# Patient Record
Sex: Female | Born: 1975 | Hispanic: No | Marital: Married | Smoking: Never smoker
Health system: Southern US, Community
[De-identification: ages and names within clinical notes are randomized; demographics above are authoritative.]

## PROBLEM LIST (undated history)

## (undated) ENCOUNTER — Emergency Department: Admission: EM | Payer: Medicare (Managed Care)

## (undated) ENCOUNTER — Emergency Department: Disposition: A | Discharge status: Home / Self Care | Payer: Medicare Other | Source: Home / Self Care

## (undated) ENCOUNTER — Emergency Department
Discharge status: Against Medical Advice | Payer: Medicare Other | Attending: Emergency Medicine | Admitting: Emergency Medicine

## (undated) DIAGNOSIS — D649 Anemia, unspecified: Secondary | ICD-10-CM

## (undated) DIAGNOSIS — E119 Type 2 diabetes mellitus without complications: Secondary | ICD-10-CM

## (undated) DIAGNOSIS — J302 Other seasonal allergic rhinitis: Secondary | ICD-10-CM

## (undated) DIAGNOSIS — F32A Depression, unspecified: Secondary | ICD-10-CM

## (undated) DIAGNOSIS — F410 Panic disorder [episodic paroxysmal anxiety] without agoraphobia: Secondary | ICD-10-CM

## (undated) DIAGNOSIS — J45909 Unspecified asthma, uncomplicated: Secondary | ICD-10-CM

## (undated) DIAGNOSIS — T7840XA Allergy, unspecified, initial encounter: Secondary | ICD-10-CM

## (undated) DIAGNOSIS — I10 Essential (primary) hypertension: Secondary | ICD-10-CM

## (undated) HISTORY — PX: CHOLECYSTECTOMY: SHX55

## (undated) HISTORY — PX: TONSILLECTOMY: SUR1361

## (undated) HISTORY — PX: HERNIA REPAIR: SHX51

---

## 1997-03-11 ENCOUNTER — Ambulatory Visit
Admit: 1997-03-11 | Disposition: A | Discharge status: Home / Self Care | Payer: Self-pay | Source: Ambulatory Visit | Admitting: Obstetrics and Gynecology

## 1997-05-03 ENCOUNTER — Ambulatory Visit
Admission: RE | Admit: 1997-05-03 | Disposition: A | Discharge status: Home / Self Care | Payer: Self-pay | Source: Ambulatory Visit | Admitting: Obstetrics and Gynecology

## 1997-06-09 ENCOUNTER — Inpatient Hospital Stay
Admission: RE | Admit: 1997-06-09 | Disposition: A | Discharge status: Home / Self Care | Payer: Self-pay | Source: Ambulatory Visit | Admitting: Obstetrics and Gynecology

## 1998-10-19 ENCOUNTER — Emergency Department: Admit: 1998-10-19 | Payer: Self-pay | Admitting: Emergency Medicine

## 1998-11-27 ENCOUNTER — Emergency Department: Admit: 1998-11-27 | Payer: Self-pay | Source: Emergency Department | Admitting: Emergency Medicine

## 1998-12-17 ENCOUNTER — Emergency Department: Admit: 1998-12-17 | Payer: Self-pay | Source: Emergency Department | Admitting: Pediatric Emergency Medicine

## 1999-02-01 ENCOUNTER — Emergency Department: Admit: 1999-02-01 | Payer: Self-pay | Source: Emergency Department | Admitting: Emergency Medical Services

## 1999-06-17 ENCOUNTER — Inpatient Hospital Stay
Admission: EM | Admit: 1999-06-17 | Disposition: A | Discharge status: Home / Self Care | Payer: Self-pay | Source: Emergency Department | Admitting: Obstetrics and Gynecology

## 1999-06-30 ENCOUNTER — Ambulatory Visit
Admission: RE | Admit: 1999-06-30 | Disposition: A | Discharge status: Home / Self Care | Payer: Self-pay | Source: Ambulatory Visit | Admitting: Obstetrics and Gynecology

## 1999-07-07 ENCOUNTER — Ambulatory Visit
Admission: RE | Admit: 1999-07-07 | Disposition: A | Discharge status: Home / Self Care | Payer: Self-pay | Source: Ambulatory Visit | Admitting: Obstetrics & Gynecology

## 1999-07-25 ENCOUNTER — Ambulatory Visit
Admission: RE | Admit: 1999-07-25 | Disposition: A | Discharge status: Home / Self Care | Payer: Self-pay | Source: Ambulatory Visit | Admitting: Obstetrics & Gynecology

## 1999-08-19 ENCOUNTER — Observation Stay (HOSPITAL_BASED_OUTPATIENT_CLINIC_OR_DEPARTMENT_OTHER)
Admission: AD | Admit: 1999-08-19 | Disposition: A | Discharge status: Home / Self Care | Payer: Self-pay | Source: Ambulatory Visit | Admitting: Obstetrics & Gynecology

## 1999-08-28 ENCOUNTER — Observation Stay
Admission: RE | Admit: 1999-08-28 | Disposition: A | Discharge status: Home / Self Care | Payer: Self-pay | Source: Ambulatory Visit | Admitting: Obstetrics and Gynecology

## 1999-08-29 ENCOUNTER — Inpatient Hospital Stay
Admission: RE | Admit: 1999-08-29 | Disposition: A | Discharge status: Home / Self Care | Payer: Self-pay | Source: Ambulatory Visit | Admitting: Obstetrics and Gynecology

## 2000-05-12 ENCOUNTER — Emergency Department: Admit: 2000-05-12 | Payer: Self-pay | Source: Emergency Department | Admitting: Emergency Medicine

## 2000-06-03 ENCOUNTER — Emergency Department: Admit: 2000-06-03 | Payer: Self-pay | Source: Emergency Department

## 2000-06-22 ENCOUNTER — Emergency Department: Admit: 2000-06-22 | Payer: Self-pay | Source: Emergency Department | Admitting: Emergency Medicine

## 2000-07-07 ENCOUNTER — Emergency Department: Admit: 2000-07-07 | Payer: Self-pay | Source: Emergency Department

## 2000-07-29 ENCOUNTER — Emergency Department: Admit: 2000-07-29 | Payer: Self-pay | Source: Emergency Department | Admitting: Pediatric Emergency Medicine

## 2000-07-31 ENCOUNTER — Observation Stay (HOSPITAL_BASED_OUTPATIENT_CLINIC_OR_DEPARTMENT_OTHER)
Admission: RE | Admit: 2000-07-31 | Disposition: A | Discharge status: Home / Self Care | Payer: Self-pay | Source: Ambulatory Visit

## 2000-08-01 ENCOUNTER — Observation Stay (HOSPITAL_BASED_OUTPATIENT_CLINIC_OR_DEPARTMENT_OTHER)
Admission: RE | Admit: 2000-08-01 | Disposition: A | Discharge status: Home / Self Care | Payer: Self-pay | Source: Ambulatory Visit

## 2000-08-15 ENCOUNTER — Inpatient Hospital Stay (INDEPENDENT_AMBULATORY_CARE_PROVIDER_SITE_OTHER): Admit: 2000-08-15 | Disposition: A | Discharge status: Home / Self Care | Payer: Self-pay | Source: Ambulatory Visit

## 2000-08-29 ENCOUNTER — Emergency Department: Admit: 2000-08-29 | Payer: Self-pay | Source: Emergency Department

## 2000-09-13 ENCOUNTER — Observation Stay (HOSPITAL_BASED_OUTPATIENT_CLINIC_OR_DEPARTMENT_OTHER)
Admission: RE | Admit: 2000-09-13 | Disposition: A | Discharge status: Home / Self Care | Payer: Self-pay | Source: Ambulatory Visit

## 2000-09-28 ENCOUNTER — Observation Stay (HOSPITAL_BASED_OUTPATIENT_CLINIC_OR_DEPARTMENT_OTHER)
Admission: RE | Admit: 2000-09-28 | Disposition: A | Discharge status: Home / Self Care | Payer: Self-pay | Source: Ambulatory Visit

## 2000-09-28 ENCOUNTER — Ambulatory Visit: Admit: 2000-09-28 | Disposition: A | Discharge status: Home / Self Care | Payer: Self-pay | Source: Ambulatory Visit

## 2000-10-16 ENCOUNTER — Emergency Department: Admit: 2000-10-16 | Payer: Self-pay | Source: Emergency Department

## 2000-10-25 ENCOUNTER — Observation Stay (HOSPITAL_BASED_OUTPATIENT_CLINIC_OR_DEPARTMENT_OTHER)
Admission: RE | Admit: 2000-10-25 | Disposition: A | Discharge status: Home / Self Care | Payer: Self-pay | Source: Ambulatory Visit

## 2000-10-30 ENCOUNTER — Observation Stay (HOSPITAL_BASED_OUTPATIENT_CLINIC_OR_DEPARTMENT_OTHER)
Admission: RE | Admit: 2000-10-30 | Disposition: A | Discharge status: Home / Self Care | Payer: Self-pay | Source: Ambulatory Visit

## 2000-11-08 ENCOUNTER — Observation Stay (HOSPITAL_BASED_OUTPATIENT_CLINIC_OR_DEPARTMENT_OTHER)
Admission: RE | Admit: 2000-11-08 | Disposition: A | Discharge status: Home / Self Care | Payer: Self-pay | Source: Ambulatory Visit

## 2000-11-14 ENCOUNTER — Observation Stay (HOSPITAL_BASED_OUTPATIENT_CLINIC_OR_DEPARTMENT_OTHER)
Admission: RE | Admit: 2000-11-14 | Disposition: A | Discharge status: Home / Self Care | Payer: Self-pay | Source: Ambulatory Visit

## 2000-11-20 ENCOUNTER — Emergency Department: Admit: 2000-11-20 | Payer: Self-pay | Source: Emergency Department | Admitting: Emergency Medicine

## 2000-11-20 ENCOUNTER — Inpatient Hospital Stay (HOSPITAL_BASED_OUTPATIENT_CLINIC_OR_DEPARTMENT_OTHER)
Admission: RE | Admit: 2000-11-20 | Disposition: A | Discharge status: Home / Self Care | Payer: Self-pay | Source: Ambulatory Visit

## 2000-12-20 ENCOUNTER — Emergency Department: Admit: 2000-12-20 | Payer: Self-pay | Source: Emergency Department

## 2000-12-21 ENCOUNTER — Emergency Department: Admit: 2000-12-21 | Payer: Self-pay | Source: Emergency Department

## 2001-03-20 ENCOUNTER — Emergency Department: Admit: 2001-03-20 | Payer: Self-pay | Source: Emergency Department | Admitting: Emergency Medicine

## 2001-03-26 ENCOUNTER — Emergency Department: Admit: 2001-03-26 | Payer: Self-pay | Source: Emergency Department | Admitting: Emergency Medicine

## 2001-03-26 ENCOUNTER — Observation Stay
Admission: EM | Admit: 2001-03-26 | Disposition: A | Discharge status: Home / Self Care | Payer: Self-pay | Source: Ambulatory Visit | Admitting: Surgery

## 2001-04-02 ENCOUNTER — Inpatient Hospital Stay
Admission: EM | Admit: 2001-04-02 | Disposition: A | Discharge status: Home / Self Care | Payer: Self-pay | Source: Emergency Department | Admitting: Surgery

## 2001-04-09 ENCOUNTER — Emergency Department: Admit: 2001-04-09 | Payer: Self-pay | Source: Emergency Department | Admitting: Emergency Medicine

## 2001-08-06 ENCOUNTER — Emergency Department: Admit: 2001-08-06 | Payer: Self-pay | Source: Emergency Department | Admitting: Emergency Medicine

## 2001-09-04 ENCOUNTER — Emergency Department: Admit: 2001-09-04 | Payer: Self-pay | Source: Emergency Department | Admitting: Emergency Medicine

## 2002-09-11 ENCOUNTER — Emergency Department: Admit: 2002-09-11 | Payer: Self-pay | Source: Emergency Department | Admitting: Emergency Medicine

## 2002-11-15 ENCOUNTER — Emergency Department: Admit: 2002-11-15 | Payer: Self-pay | Source: Emergency Department | Admitting: Emergency Medicine

## 2002-12-31 ENCOUNTER — Emergency Department: Admit: 2002-12-31 | Payer: Self-pay | Source: Emergency Department | Admitting: Emergency Medicine

## 2003-06-30 ENCOUNTER — Emergency Department: Admit: 2003-06-30 | Payer: Self-pay | Source: Emergency Department

## 2003-06-30 ENCOUNTER — Emergency Department: Admit: 2003-06-30 | Payer: Self-pay | Source: Emergency Department | Admitting: Emergency Medicine

## 2003-08-07 ENCOUNTER — Emergency Department: Admit: 2003-08-07 | Payer: Self-pay | Source: Emergency Department | Admitting: Emergency Medicine

## 2003-08-27 ENCOUNTER — Emergency Department: Admit: 2003-08-27 | Payer: Self-pay | Source: Emergency Department | Admitting: Emergency Medicine

## 2003-08-28 ENCOUNTER — Emergency Department: Admit: 2003-08-28 | Payer: Self-pay | Source: Emergency Department | Admitting: Emergency Medicine

## 2003-08-28 ENCOUNTER — Emergency Department: Admit: 2003-08-28 | Payer: Self-pay | Source: Emergency Department

## 2003-10-14 ENCOUNTER — Emergency Department: Admit: 2003-10-14 | Payer: Self-pay | Source: Emergency Department | Admitting: Emergency Medicine

## 2004-01-03 ENCOUNTER — Emergency Department: Admit: 2004-01-03 | Payer: Self-pay | Attending: Emergency Medicine | Admitting: Emergency Medicine

## 2004-01-07 ENCOUNTER — Emergency Department: Admit: 2004-01-07 | Payer: Self-pay | Source: Emergency Department

## 2004-02-13 ENCOUNTER — Emergency Department: Admit: 2004-02-13 | Payer: Self-pay | Source: Emergency Department

## 2004-04-22 ENCOUNTER — Emergency Department: Admit: 2004-04-22 | Payer: Self-pay

## 2004-04-23 ENCOUNTER — Emergency Department: Admit: 2004-04-23 | Payer: Self-pay | Source: Emergency Department | Admitting: Emergency Medicine

## 2004-06-21 ENCOUNTER — Inpatient Hospital Stay
Admission: EM | Admit: 2004-06-21 | Disposition: A | Discharge status: Home / Self Care | Payer: Self-pay | Source: Emergency Department | Admitting: Internal Medicine

## 2004-07-10 ENCOUNTER — Emergency Department: Admit: 2004-07-10 | Payer: Self-pay | Source: Emergency Department

## 2004-07-22 ENCOUNTER — Emergency Department: Admit: 2004-07-22 | Payer: Self-pay | Source: Emergency Department

## 2004-10-06 ENCOUNTER — Emergency Department: Admit: 2004-10-06 | Payer: Self-pay | Source: Emergency Department | Admitting: Emergency Medicine

## 2004-12-04 ENCOUNTER — Emergency Department: Admit: 2004-12-04 | Payer: Self-pay | Source: Emergency Department | Admitting: Emergency Medicine

## 2004-12-10 ENCOUNTER — Emergency Department: Admit: 2004-12-10 | Payer: Self-pay | Source: Ambulatory Visit | Admitting: Emergency Medicine

## 2005-01-10 ENCOUNTER — Emergency Department: Admit: 2005-01-10 | Payer: Self-pay | Source: Emergency Department

## 2005-01-14 ENCOUNTER — Emergency Department: Admit: 2005-01-14 | Payer: Self-pay | Source: Emergency Department | Admitting: Emergency Medicine

## 2005-01-29 ENCOUNTER — Emergency Department: Admit: 2005-01-29 | Payer: Self-pay | Source: Emergency Department

## 2005-02-16 ENCOUNTER — Emergency Department: Admit: 2005-02-16 | Payer: Self-pay | Source: Emergency Department

## 2005-02-19 ENCOUNTER — Emergency Department: Admit: 2005-02-19 | Payer: Self-pay

## 2005-03-03 ENCOUNTER — Emergency Department: Admit: 2005-03-03 | Payer: Self-pay | Source: Emergency Department

## 2005-03-13 ENCOUNTER — Emergency Department: Admit: 2005-03-13 | Payer: Self-pay | Source: Emergency Department

## 2005-03-13 ENCOUNTER — Emergency Department: Admit: 2005-03-13 | Payer: Self-pay | Admitting: Emergency Medicine

## 2005-03-30 ENCOUNTER — Emergency Department: Admit: 2005-03-30 | Payer: Self-pay | Source: Emergency Department

## 2005-04-20 ENCOUNTER — Emergency Department: Admit: 2005-04-20 | Payer: Self-pay

## 2005-05-20 ENCOUNTER — Emergency Department: Admit: 2005-05-20 | Payer: Self-pay | Source: Emergency Department

## 2005-05-21 ENCOUNTER — Emergency Department: Admit: 2005-05-21 | Payer: Self-pay

## 2005-06-03 ENCOUNTER — Emergency Department: Admit: 2005-06-03 | Payer: Self-pay | Source: Emergency Department | Admitting: Emergency Medicine

## 2005-06-04 ENCOUNTER — Emergency Department: Admit: 2005-06-04 | Payer: Self-pay | Source: Emergency Department | Admitting: Emergency Medicine

## 2005-06-25 ENCOUNTER — Emergency Department: Admit: 2005-06-25 | Payer: Self-pay | Source: Emergency Department

## 2005-07-10 ENCOUNTER — Emergency Department: Admit: 2005-07-10 | Payer: Self-pay | Source: Emergency Department

## 2005-07-11 ENCOUNTER — Emergency Department: Admit: 2005-07-11 | Payer: Self-pay | Source: Emergency Department

## 2005-07-28 ENCOUNTER — Emergency Department: Admit: 2005-07-28 | Payer: Self-pay | Source: Emergency Department

## 2005-07-28 LAB — URINALYSIS
Bilirubin, UA: NEGATIVE
Glucose, UA: NEGATIVE
Ketones UA: NEGATIVE
Leukocyte Esterase, UA: NEGATIVE
Nitrite, UA: NEGATIVE
Protein, UR: NEGATIVE
Specific Gravity UA POCT: 1.015 (ref <=1.030)
Urine pH: 6 (ref 5.0–8.0)
Urobilinogen, UA: 0.2

## 2005-07-28 LAB — URINE MICROSCOPIC

## 2005-07-28 LAB — URINE HCG QUALITATIVE: Urine HCG Qualitative: NEGATIVE

## 2005-08-01 ENCOUNTER — Emergency Department: Admit: 2005-08-01 | Payer: Self-pay | Source: Emergency Department

## 2005-08-24 ENCOUNTER — Emergency Department: Admit: 2005-08-24 | Payer: Self-pay | Source: Emergency Department

## 2005-09-25 ENCOUNTER — Emergency Department: Admit: 2005-09-25 | Payer: Self-pay | Source: Emergency Department

## 2005-09-25 LAB — URINALYSIS
Bilirubin, UA: NEGATIVE
Blood, UA: NEGATIVE
Glucose, UA: NEGATIVE
Ketones UA: NEGATIVE
Leukocyte Esterase, UA: NEGATIVE
Nitrite, UA: NEGATIVE
Protein, UR: NEGATIVE
Specific Gravity UA POCT: 1.015 (ref <=1.030)
Urine pH: 5.5 (ref 5.0–8.0)
Urobilinogen, UA: 0.2

## 2005-09-25 LAB — URINE HCG QUALITATIVE: Urine HCG Qualitative: NEGATIVE

## 2005-10-12 ENCOUNTER — Ambulatory Visit
Admit: 2005-10-12 | Disposition: A | Discharge status: Home / Self Care | Payer: Self-pay | Source: Emergency Department | Admitting: Internal Medicine

## 2005-11-07 ENCOUNTER — Emergency Department: Admit: 2005-11-07 | Payer: Self-pay | Source: Emergency Department

## 2005-11-08 LAB — URINE MICROSCOPIC

## 2005-11-08 LAB — URINALYSIS
Bilirubin, UA: NEGATIVE
Blood, UA: NEGATIVE
Glucose, UA: NEGATIVE
Ketones UA: NEGATIVE
Nitrite, UA: NEGATIVE
Protein, UR: NEGATIVE
Specific Gravity UA POCT: 1.02 (ref <=1.030)
Urine pH: 6.5 (ref 5.0–8.0)
Urobilinogen, UA: 0.2

## 2005-11-08 LAB — URINE HCG QUALITATIVE: Urine HCG Qualitative: NEGATIVE

## 2006-02-07 ENCOUNTER — Emergency Department: Admit: 2006-02-07 | Payer: Self-pay | Source: Emergency Department | Admitting: Emergency Medicine

## 2006-04-26 ENCOUNTER — Emergency Department: Admit: 2006-04-26 | Payer: Self-pay | Source: Emergency Department

## 2006-05-08 ENCOUNTER — Emergency Department: Admit: 2006-05-08 | Payer: Self-pay | Source: Emergency Department | Admitting: Emergency Medicine

## 2006-05-08 LAB — URINE MICROSCOPIC

## 2006-05-08 LAB — URINALYSIS
Bilirubin, UA: NEGATIVE
Blood, UA: NEGATIVE
Glucose, UA: NEGATIVE
Ketones UA: NEGATIVE
Nitrite, UA: NEGATIVE
Protein, UR: NEGATIVE
Specific Gravity UA POCT: 1.015 (ref <=1.030)
Urine pH: 6.5 (ref 5.0–8.0)
Urobilinogen, UA: 0.2

## 2006-05-08 LAB — URINE HCG QUALITATIVE: Urine HCG Qualitative: NEGATIVE

## 2006-05-20 ENCOUNTER — Emergency Department: Admit: 2006-05-20 | Payer: Self-pay | Source: Emergency Department

## 2006-07-29 ENCOUNTER — Emergency Department: Admit: 2006-07-29 | Payer: Self-pay | Source: Emergency Department

## 2006-07-30 ENCOUNTER — Emergency Department: Admit: 2006-07-30 | Payer: Self-pay | Source: Emergency Department | Admitting: Emergency Medicine

## 2006-08-10 ENCOUNTER — Emergency Department: Admit: 2006-08-10 | Payer: Self-pay | Source: Emergency Department | Admitting: Emergency Medicine

## 2006-08-10 LAB — COMPREHENSIVE METABOLIC PANEL - AH CERNER
ALT: 12 U/L (ref 0–31)
AST (SGOT): 31 U/L (ref 0–31)
Albumin/Globulin Ratio: 1.1 (ref 1.1–2.2)
Albumin: 4.2 g/dL (ref 3.4–4.8)
Alkaline Phosphatase: 91 U/L (ref 35–104)
Anion Gap: 13 mEq/L (ref 5–15)
BUN: 9 mg/dL (ref 6–20)
Bilirubin, Total: 0.3 mg/dL (ref 0.0–1.0)
CA: 3.8 mEq/L (ref 3.8–4.6)
CO2: 26.4 mEq/L (ref 22.0–29.0)
Calcium: 9.3 mg/dL (ref 8.4–10.2)
Chloride: 101 mEq/L (ref 96–108)
Creatinine: 0.7 mg/dL (ref 0.4–1.1)
Globulin: 3.8 g/dL — ABNORMAL HIGH (ref 2.0–3.6)
Glucose: 91 mg/dL
Osmolality Calculated: 288 mosm/kg (ref 282–298)
Potassium: 4.4 mEq/L (ref 3.3–5.1)
Protein, Total: 8 g/dL (ref 6.4–8.3)
Sodium: 140 mEq/L (ref 133–145)
UN/CREA SOFT: 13 RATIO (ref 6–33)

## 2006-08-10 LAB — URINALYSIS WITH MICROSCOPIC
Bilirubin, UA: NEGATIVE
Glucose, UA: NEGATIVE
Ketones UA: NEGATIVE
Leukocyte Esterase, UA: NEGATIVE
Nitrite, UA: NEGATIVE
Protein, UR: NEGATIVE
Specific Gravity UA POCT: 1.016 (ref 1.005–1.030)
Squamous Epithelial Cells, Urine: 2 /LPF — ABNORMAL HIGH (ref 0–0)
Urine pH: 6 (ref 4.6–8.0)
Urobilinogen, UA: 0.2 EU/dL (ref 0.2–1.0)
WBC, UA: 2 /HPF (ref 0–5)

## 2006-08-10 LAB — CBC WITH AUTO DIFFERENTIAL CERNER
Basophils Absolute: 0 /mm3 (ref 0.0–0.2)
Basophils: 0 % (ref 0–2)
Eosinophils Absolute: 0.3 /mm3 — ABNORMAL HIGH (ref 0.0–0.2)
Eosinophils: 4 % (ref 0–5)
Granulocytes Absolute: 5.2 /mm3 (ref 1.8–8.1)
Hematocrit: 35.3 % — ABNORMAL LOW (ref 37.0–47.0)
Hgb: 12.1 G/DL (ref 12.0–16.0)
Lymphocytes Absolute: 1.6 /mm3 (ref 0.5–4.4)
Lymphocytes: 22 % (ref 15–41)
MCH: 27 PG — ABNORMAL LOW (ref 28.0–32.0)
MCHC: 34.2 G/DL (ref 32.0–36.0)
MCV: 79 FL — ABNORMAL LOW (ref 80.0–100.0)
MPV: 8.4 FL (ref 7.4–10.4)
Monocytes Absolute: 0.3 /mm3 (ref 0.0–1.2)
Monocytes: 5 % (ref 0–11)
Neutrophils %: 70 % (ref 52–75)
Platelets: 372 /mm3 (ref 140–400)
RBC: 4.46 /mm3 (ref 4.20–5.40)
RDW: 15 % (ref 11.5–15.0)
WBC: 7.4 /mm3 (ref 3.5–10.8)

## 2006-08-10 LAB — HEMOLYSIS INDEX: Hemolysis Index: 125 Units

## 2006-08-10 LAB — GFR

## 2006-08-22 ENCOUNTER — Emergency Department: Admit: 2006-08-22 | Payer: Self-pay | Source: Emergency Department | Admitting: Emergency Medicine

## 2006-08-22 ENCOUNTER — Emergency Department: Admit: 2006-08-22 | Payer: Self-pay | Source: Emergency Department

## 2006-08-22 LAB — CBC WITH AUTO DIFFERENTIAL CERNER
Basophils Absolute: 0.1 /mm3 (ref 0.0–0.2)
Basophils: 1 % (ref 0–2)
Eosinophils Absolute: 0.3 /mm3 (ref 0.0–0.7)
Eosinophils: 3 % (ref 0–5)
Granulocytes Absolute: 5.1 /mm3 (ref 1.8–8.1)
Hematocrit: 32.8 % — ABNORMAL LOW (ref 37.0–47.0)
Hgb: 11.3 G/DL — ABNORMAL LOW (ref 12.0–16.0)
Lymphocytes Absolute: 2.4 /mm3 (ref 0.5–4.4)
Lymphocytes: 29 % (ref 15–41)
MCH: 27.8 PG — ABNORMAL LOW (ref 28.0–32.0)
MCHC: 34.5 G/DL (ref 32.0–36.0)
MCV: 80.6 FL (ref 80.0–100.0)
MPV: 7.9 FL (ref 7.4–10.4)
Monocytes Absolute: 0.4 /mm3 (ref 0.0–1.2)
Monocytes: 5 % (ref 0–11)
Neutrophils %: 62 % (ref 52–75)
Platelets: 356 /mm3 (ref 140–400)
RBC: 4.07 /mm3 — ABNORMAL LOW (ref 4.20–5.40)
RDW: 14.6 % (ref 11.5–15.0)
WBC: 8.3 /mm3 (ref 3.5–10.8)

## 2006-08-22 LAB — URINE HCG QUALITATIVE: Urine HCG Qualitative: NEGATIVE

## 2006-08-22 LAB — BASIC METABOLIC PANEL
BUN: 10 MG/DL (ref 7–21)
CO2: 27 MEQ/L (ref 22–31)
Calcium: 9.2 MG/DL (ref 8.6–10.2)
Chloride: 102 MEQ/L (ref 98–107)
Creatinine: 0.6 MG/DL (ref 0.5–1.4)
Glucose: 81 MG/DL (ref 70–105)
Potassium: 4 MEQ/L (ref 3.6–5.0)
Sodium: 140 MEQ/L (ref 136–143)

## 2006-08-22 LAB — URINALYSIS WITH MICROSCOPIC
Bilirubin, UA: NEGATIVE
Blood, UA: NEGATIVE
Glucose, UA: NEGATIVE
Ketones UA: NEGATIVE
Nitrite, UA: NEGATIVE
Protein, UR: NEGATIVE
Specific Gravity UA POCT: 1.02 (ref <=1.030)
Urine pH: 6 (ref 5.0–8.0)
Urobilinogen, UA: 0.2

## 2006-08-22 LAB — GFR

## 2006-09-12 ENCOUNTER — Emergency Department
Admit: 2006-09-12 | Payer: Self-pay | Source: Emergency Department | Attending: Emergency Medicine | Admitting: Emergency Medicine

## 2006-09-12 LAB — BASIC METABOLIC PANEL
BUN: 11 MG/DL (ref 7–21)
CO2: 26 MEQ/L (ref 22–31)
Calcium: 9 MG/DL (ref 8.6–10.2)
Chloride: 103 MEQ/L (ref 98–107)
Creatinine: 0.6 MG/DL (ref 0.5–1.4)
Glucose: 100 MG/DL (ref 70–105)
Potassium: 4.5 MEQ/L (ref 3.6–5.0)
Sodium: 139 MEQ/L (ref 136–143)

## 2006-09-12 LAB — CBC WITH AUTO DIFFERENTIAL CERNER
Basophils Absolute: 0 /mm3 (ref 0.0–0.2)
Basophils: 0 % (ref 0–2)
Eosinophils Absolute: 0.1 /mm3 (ref 0.0–0.7)
Eosinophils: 1 % (ref 0–5)
Granulocytes Absolute: 8 /mm3 (ref 1.8–8.1)
Hematocrit: 34.7 % — ABNORMAL LOW (ref 37.0–47.0)
Hgb: 12 G/DL (ref 12.0–16.0)
Lymphocytes Absolute: 1.5 /mm3 (ref 0.5–4.4)
Lymphocytes: 15 % (ref 15–41)
MCH: 27.7 PG — ABNORMAL LOW (ref 28.0–32.0)
MCHC: 34.7 G/DL (ref 32.0–36.0)
MCV: 79.8 FL — ABNORMAL LOW (ref 80.0–100.0)
MPV: 8 FL (ref 7.4–10.4)
Monocytes Absolute: 0.3 /mm3 (ref 0.0–1.2)
Monocytes: 3 % (ref 0–11)
Neutrophils %: 81 % — ABNORMAL HIGH (ref 52–75)
Platelets: 351 /mm3 (ref 140–400)
RBC: 4.34 /mm3 (ref 4.20–5.40)
RDW: 14.7 % (ref 11.5–15.0)
WBC: 9.9 /mm3 (ref 3.5–10.8)

## 2006-09-12 LAB — URINE MICROSCOPIC

## 2006-09-12 LAB — URINALYSIS
Bilirubin, UA: NEGATIVE
Glucose, UA: NEGATIVE
Ketones UA: NEGATIVE
Leukocyte Esterase, UA: NEGATIVE
Nitrite, UA: NEGATIVE
Protein, UR: NEGATIVE
Specific Gravity UA POCT: 1.01 (ref <=1.030)
Urine pH: 7 (ref 5.0–8.0)
Urobilinogen, UA: 0.2

## 2006-09-12 LAB — HEPATIC FUNCTION PANEL
ALT: 15 U/L (ref 9–52)
AST (SGOT): 21 U/L (ref 8–39)
Albumin/Globulin Ratio: 1 — ABNORMAL LOW (ref 1.1–1.8)
Albumin: 4.1 G/DL (ref 3.7–5.1)
Alkaline Phosphatase: 99 U/L (ref 43–122)
Bilirubin Direct: 0 MG/DL — AB (ref 0.0–0.3)
Bilirubin Indirect: 0.3 MG/DL (ref 0.0–1.1)
Bilirubin, Total: 0.1 MG/DL — AB (ref 0.2–1.3)
Globulin: 4.2 G/DL — ABNORMAL HIGH (ref 2.0–3.7)
Protein, Total: 8.3 G/DL — ABNORMAL HIGH (ref 6.0–8.0)

## 2006-09-12 LAB — AMYLASE: Amylase: 26 U/L — ABNORMAL LOW (ref 30–110)

## 2006-09-12 LAB — RAPID DRUG SCREEN, URINE
Barbiturate Screen, UR: NEGATIVE
Benzodiazepine Screen, UR: NEGATIVE
Cocaine, UR: NEGATIVE
Opiate Screen, UR: NEGATIVE
PCP Screen, UR: NEGATIVE
THC Urine: NEGATIVE
Urine Amphetamine Screen: NEGATIVE

## 2006-09-12 LAB — URINE HCG QUALITATIVE: Urine HCG Qualitative: NEGATIVE

## 2006-09-12 LAB — LIPASE: Lipase: 27 U/L (ref 23–300)

## 2006-09-12 LAB — GFR

## 2006-09-27 ENCOUNTER — Emergency Department: Admit: 2006-09-27 | Payer: Self-pay | Source: Emergency Department

## 2006-10-19 ENCOUNTER — Emergency Department: Admit: 2006-10-19 | Payer: Self-pay | Source: Emergency Department | Admitting: Emergency Medicine

## 2006-10-19 LAB — URINE HCG QUALITATIVE: Urine HCG Qualitative: NEGATIVE

## 2006-10-19 LAB — BASIC METABOLIC PANEL
BUN: 13 MG/DL (ref 7–21)
CO2: 25 MEQ/L (ref 22–31)
Calcium: 9.5 MG/DL (ref 8.6–10.2)
Chloride: 102 MEQ/L (ref 98–107)
Creatinine: 0.7 MG/DL (ref 0.5–1.4)
Glucose: 88 MG/DL (ref 70–105)
Potassium: 4.3 MEQ/L (ref 3.6–5.0)
Sodium: 139 MEQ/L (ref 136–143)

## 2006-10-19 LAB — GFR

## 2006-10-19 LAB — CBC WITH AUTO DIFFERENTIAL CERNER
Basophils Absolute: 0.1 /mm3 (ref 0.0–0.2)
Basophils: 1 % (ref 0–2)
Eosinophils Absolute: 0.4 /mm3 (ref 0.0–0.7)
Eosinophils: 4 % (ref 0–5)
Granulocytes Absolute: 5.8 /mm3 (ref 1.8–8.1)
Hematocrit: 34.5 % — ABNORMAL LOW (ref 37.0–47.0)
Hgb: 11.7 G/DL — ABNORMAL LOW (ref 12.0–16.0)
Lymphocytes Absolute: 3.6 /mm3 (ref 0.5–4.4)
Lymphocytes: 34 % (ref 15–41)
MCH: 26.9 PG — ABNORMAL LOW (ref 28.0–32.0)
MCHC: 33.8 G/DL (ref 32.0–36.0)
MCV: 79.6 FL — ABNORMAL LOW (ref 80.0–100.0)
MPV: 7.9 FL (ref 7.4–10.4)
Monocytes Absolute: 0.6 /mm3 (ref 0.0–1.2)
Monocytes: 5 % (ref 0–11)
Neutrophils %: 56 % (ref 52–75)
Platelets: 386 /mm3 (ref 140–400)
RBC: 4.33 /mm3 (ref 4.20–5.40)
RDW: 15.2 % — ABNORMAL HIGH (ref 11.5–15.0)
WBC: 10.4 /mm3 (ref 3.5–10.8)

## 2006-10-19 LAB — URINALYSIS
Bilirubin, UA: NEGATIVE
Blood, UA: NEGATIVE
Glucose, UA: NEGATIVE
Ketones UA: NEGATIVE
Leukocyte Esterase, UA: NEGATIVE
Nitrite, UA: NEGATIVE
Protein, UR: NEGATIVE
Specific Gravity UA POCT: 1.025 (ref <=1.030)
Urine pH: 6 (ref 5.0–8.0)
Urobilinogen, UA: 0.2

## 2006-12-16 ENCOUNTER — Emergency Department: Admit: 2006-12-16 | Payer: Self-pay | Source: Emergency Department | Admitting: Emergency Medicine

## 2006-12-16 LAB — COMPREHENSIVE METABOLIC PANEL
ALT: 17 U/L (ref 9–52)
AST (SGOT): 23 U/L (ref 8–39)
Albumin/Globulin Ratio: 1.1 (ref 1.1–1.8)
Albumin: 3.8 G/DL (ref 3.7–5.1)
Alkaline Phosphatase: 92 U/L (ref 43–122)
BUN: 14 MG/DL (ref 7–21)
Bilirubin, Total: 0.1 MG/DL — AB (ref 0.2–1.3)
CO2: 27 MEQ/L (ref 22–31)
Calcium: 9.1 MG/DL (ref 8.6–10.2)
Chloride: 103 MEQ/L (ref 98–107)
Creatinine: 0.8 MG/DL (ref 0.5–1.4)
Globulin: 3.6 G/DL (ref 2.0–3.7)
Glucose: 82 MG/DL (ref 70–105)
Potassium: 4.1 MEQ/L (ref 3.6–5.0)
Protein, Total: 7.4 G/DL (ref 6.0–8.0)
Sodium: 138 MEQ/L (ref 136–143)

## 2006-12-16 LAB — CBC WITH AUTO DIFFERENTIAL CERNER
Basophils Absolute: 0.1 /mm3 (ref 0.0–0.2)
Basophils: 1 % (ref 0–2)
Eosinophils Absolute: 0.3 /mm3 (ref 0.0–0.7)
Eosinophils: 3 % (ref 0–5)
Granulocytes Absolute: 7 /mm3 (ref 1.8–8.1)
Hematocrit: 32.8 % — ABNORMAL LOW (ref 37.0–47.0)
Hgb: 11.4 G/DL — ABNORMAL LOW (ref 12.0–16.0)
Lymphocytes Absolute: 2.2 /mm3 (ref 0.5–4.4)
Lymphocytes: 22 % (ref 15–41)
MCH: 27.3 PG — ABNORMAL LOW (ref 28.0–32.0)
MCHC: 34.7 G/DL (ref 32.0–36.0)
MCV: 78.8 FL — ABNORMAL LOW (ref 80.0–100.0)
MPV: 7.7 FL (ref 7.4–10.4)
Monocytes Absolute: 0.4 /mm3 (ref 0.0–1.2)
Monocytes: 4 % (ref 0–11)
Neutrophils %: 70 % (ref 52–75)
Platelets: 397 /mm3 (ref 140–400)
RBC: 4.16 /mm3 — ABNORMAL LOW (ref 4.20–5.40)
RDW: 15.1 % — ABNORMAL HIGH (ref 11.5–15.0)
WBC: 10 /mm3 (ref 3.5–10.8)

## 2006-12-16 LAB — CALCIUM IONIZED-CALC. CERNER: Calcium Ionized Calculated: 1.9 mEQ/L (ref 1.9–2.3)

## 2006-12-16 LAB — HCG, SERUM, QUALITATIVE: Hcg Qualitative: NEGATIVE

## 2006-12-16 LAB — GFR

## 2007-01-27 ENCOUNTER — Emergency Department: Admit: 2007-01-27 | Payer: Self-pay | Source: Emergency Department | Admitting: Emergency Medicine

## 2007-02-04 ENCOUNTER — Emergency Department: Admit: 2007-02-04 | Payer: Self-pay | Source: Ambulatory Visit

## 2007-02-04 LAB — URINALYSIS
Bilirubin, UA: NEGATIVE
Blood, UA: NEGATIVE
Glucose, UA: NEGATIVE
Ketones UA: NEGATIVE
Nitrite, UA: NEGATIVE
Protein, UR: NEGATIVE
Specific Gravity UA POCT: 1.025 (ref <=1.030)
Urine pH: 6 (ref 5.0–8.0)
Urobilinogen, UA: 0.2

## 2007-02-04 LAB — URINE MICROSCOPIC

## 2007-02-04 LAB — URINE HCG QUALITATIVE: Urine HCG Qualitative: POSITIVE

## 2007-02-05 LAB — URINE MICROSCOPIC

## 2007-02-05 LAB — URINALYSIS
Bilirubin, UA: NEGATIVE
Blood, UA: NEGATIVE
Glucose, UA: NEGATIVE
Ketones UA: NEGATIVE
Nitrite, UA: NEGATIVE
Protein, UR: NEGATIVE
Specific Gravity UA POCT: 1.025 (ref <=1.030)
Urine pH: 6 (ref 5.0–8.0)
Urobilinogen, UA: 0.2

## 2007-02-05 LAB — COMPREHENSIVE METABOLIC PANEL
ALT: 18 U/L (ref 9–52)
AST (SGOT): 17 U/L (ref 8–39)
Albumin/Globulin Ratio: 1 — ABNORMAL LOW (ref 1.1–1.8)
Albumin: 3.4 G/DL — ABNORMAL LOW (ref 3.7–5.1)
Alkaline Phosphatase: 86 U/L (ref 43–122)
BUN: 13 MG/DL (ref 7–21)
Bilirubin, Total: 0.2 MG/DL (ref 0.2–1.3)
CO2: 23 MEQ/L (ref 22–31)
Calcium: 9.3 MG/DL (ref 8.6–10.2)
Chloride: 107 MEQ/L (ref 98–107)
Creatinine: 0.8 MG/DL (ref 0.5–1.4)
Globulin: 3.3 G/DL (ref 2.0–3.7)
Glucose: 112 MG/DL — ABNORMAL HIGH (ref 70–105)
Potassium: 4.1 MEQ/L (ref 3.6–5.0)
Protein, Total: 6.7 G/DL (ref 6.0–8.0)
Sodium: 140 MEQ/L (ref 136–143)

## 2007-02-05 LAB — CBC WITH MANUAL DIFF- CERNER
Bands: 5 % (ref 0–9)
Eosinophils %: 5 % (ref 0–5)
Hematocrit: 31.4 % — ABNORMAL LOW (ref 37.0–47.0)
Hgb: 10.7 G/DL — ABNORMAL LOW (ref 12.0–16.0)
Lymphocytes Manual: 24 % (ref 15–41)
MCH: 26.7 PG — ABNORMAL LOW (ref 28.0–32.0)
MCHC: 34.1 G/DL (ref 32.0–36.0)
MCV: 78.3 FL — ABNORMAL LOW (ref 80.0–100.0)
MPV: 7.4 FL (ref 7.4–10.4)
Monocytes Manual: 3 % (ref 0–8)
Neutrophils %: 63 % (ref 52–75)
Platelets: 374 /mm3 (ref 140–400)
RBC Morphology: NORMAL
RBC: 4 /mm3 — ABNORMAL LOW (ref 4.20–5.40)
RDW: 15.5 % — ABNORMAL HIGH (ref 11.5–15.0)
WBC: 11.1 /mm3 — ABNORMAL HIGH (ref 3.5–10.8)

## 2007-02-05 LAB — GFR

## 2007-02-05 LAB — HCG QUANTITATIVE: hCG, Quant.: 786 m[IU]/mL

## 2007-02-05 LAB — CALCIUM IONIZED-CALC. CERNER: Calcium Ionized Calculated: 2.1 mEQ/L (ref 1.9–2.3)

## 2007-03-01 ENCOUNTER — Emergency Department: Admit: 2007-03-01 | Payer: Self-pay | Source: Emergency Department | Admitting: Emergency Medicine

## 2007-03-01 LAB — CBC AND DIFFERENTIAL
Basophils Absolute: 0 /mm3 (ref 0.0–0.2)
Basophils: 0 % (ref 0–2)
Eosinophils Absolute: 0.1 /mm3 (ref 0.0–0.7)
Eosinophils: 1 % (ref 0–5)
Granulocytes Absolute: 7.3 /mm3 (ref 1.8–8.1)
Hematocrit: 31.7 % — ABNORMAL LOW (ref 37.0–47.0)
Hgb: 10.7 G/DL — ABNORMAL LOW (ref 12.0–16.0)
Immature Granulocytes Absolute: 0 CUMM (ref 0.0–0.0)
Immature Granulocytes: 0 % (ref 0–1)
Lymphocytes Absolute: 2.6 /mm3 (ref 0.5–4.4)
Lymphocytes: 25 % (ref 15–41)
MCH: 25.5 PG — ABNORMAL LOW (ref 28.0–32.0)
MCHC: 33.8 G/DL (ref 32.0–36.0)
MCV: 75.7 FL — ABNORMAL LOW (ref 80.0–100.0)
MPV: 9.4 FL (ref 7.4–10.4)
Monocytes Absolute: 0.5 /mm3 (ref 0.0–1.2)
Monocytes: 5 % (ref 0–11)
Neutrophils %: 69 % (ref 52–75)
Platelets: 327 /mm3 (ref 140–400)
RBC: 4.19 /mm3 — ABNORMAL LOW (ref 4.20–5.40)
RDW: 15.3 % — ABNORMAL HIGH (ref 11.5–15.0)
WBC: 10.62 /mm3 (ref 3.50–10.80)

## 2007-03-01 LAB — URINALYSIS WITH MICROSCOPIC
Bilirubin, UA: NEGATIVE
Blood, UA: NEGATIVE
Glucose, UA: NEGATIVE
Ketones UA: NEGATIVE
Nitrite, UA: NEGATIVE
Protein, UR: NEGATIVE
Specific Gravity UA POCT: 1.025 (ref <=1.030)
Urine pH: 6 (ref 5.0–8.0)
Urobilinogen, UA: 0.2

## 2007-03-01 LAB — CALCIUM IONIZED-CALC. CERNER: Calcium Ionized Calculated: 2.1 mEQ/L (ref 1.9–2.3)

## 2007-03-01 LAB — COMPREHENSIVE METABOLIC PANEL
ALT: 18 U/L — ABNORMAL LOW (ref 21–72)
AST (SGOT): 21 U/L (ref 8–39)
Albumin/Globulin Ratio: 1 — ABNORMAL LOW (ref 1.1–1.8)
Albumin: 3.4 G/DL — ABNORMAL LOW (ref 3.7–5.1)
Alkaline Phosphatase: 84 U/L (ref 43–122)
BUN: 11 MG/DL (ref 7–21)
Bilirubin, Total: 0.1 MG/DL — ABNORMAL LOW (ref 0.2–1.3)
CO2: 21 MEQ/L — ABNORMAL LOW (ref 22–31)
Calcium: 9.3 MG/DL (ref 8.6–10.2)
Chloride: 106 MEQ/L (ref 98–107)
Creatinine: 0.7 MG/DL (ref 0.5–1.4)
Globulin: 3.4 G/DL (ref 2.0–3.7)
Glucose: 95 MG/DL (ref 70–105)
Potassium: 4.2 MEQ/L (ref 3.6–5.0)
Protein, Total: 6.8 G/DL (ref 6.0–8.0)
Sodium: 136 MEQ/L (ref 136–143)

## 2007-03-01 LAB — HCG QUANTITATIVE: hCG, Quant.: 92671 m[IU]/mL — AB

## 2007-03-01 LAB — GFR

## 2007-03-01 LAB — LIPASE: Lipase: 66 U/L (ref 23–300)

## 2007-03-01 LAB — AMYLASE: Amylase: 27 U/L — ABNORMAL LOW (ref 30–110)

## 2007-04-08 ENCOUNTER — Emergency Department: Admit: 2007-04-08 | Payer: Self-pay | Source: Emergency Department | Admitting: Emergency Medicine

## 2007-04-09 LAB — URINALYSIS
Bilirubin, UA: NEGATIVE
Blood, UA: NEGATIVE
Glucose, UA: NEGATIVE
Ketones UA: NEGATIVE
Nitrite, UA: NEGATIVE
Protein, UR: NEGATIVE
Specific Gravity UA POCT: 1.02 (ref <=1.030)
Urine pH: 6.5 (ref 5.0–8.0)
Urobilinogen, UA: 0.2

## 2007-04-09 LAB — COMPREHENSIVE METABOLIC PANEL
ALT: 17 U/L — ABNORMAL LOW (ref 21–72)
AST (SGOT): 18 U/L (ref 8–39)
Albumin/Globulin Ratio: 0.9 — ABNORMAL LOW (ref 1.1–1.8)
Albumin: 3.1 G/DL — ABNORMAL LOW (ref 3.7–5.1)
Alkaline Phosphatase: 71 U/L (ref 43–122)
BUN: 7 MG/DL (ref 7–21)
Bilirubin, Total: 0.2 MG/DL (ref 0.2–1.3)
CO2: 19 MEQ/L — ABNORMAL LOW (ref 22–31)
Calcium: 8.7 MG/DL (ref 8.6–10.2)
Chloride: 108 MEQ/L — ABNORMAL HIGH (ref 98–107)
Creatinine: 0.5 MG/DL (ref 0.5–1.4)
Globulin: 3.3 G/DL (ref 2.0–3.7)
Glucose: 154 MG/DL — ABNORMAL HIGH (ref 70–105)
Potassium: 3.9 MEQ/L (ref 3.6–5.0)
Protein, Total: 6.4 G/DL (ref 6.0–8.0)
Sodium: 136 MEQ/L (ref 136–143)

## 2007-04-09 LAB — CBC WITH MANUAL DIFFERENTIAL
Band Neutrophils Absolute: 0.09
Bands: 1 % (ref 0–9)
Basophils %: 1 % (ref 0–2)
Basophils Absolute Manual: 0.09
Eosinophils %: 2 % (ref 0–5)
Eosinophils Absolute Manual: 0.17
Granulocytes #: 6.63
Hematocrit: 30.4 % — ABNORMAL LOW (ref 37.0–47.0)
Hgb: 10.3 G/DL — ABNORMAL LOW (ref 12.0–16.0)
LYMPH#: 1.36
Lymphocytes Manual: 16 % (ref 15–41)
MCH: 26.1 PG — ABNORMAL LOW (ref 28.0–32.0)
MCHC: 33.9 G/DL (ref 32.0–36.0)
MCV: 77.2 FL — ABNORMAL LOW (ref 80.0–100.0)
MPV: 9.5 FL (ref 7.4–10.4)
Monocytes Absolute Calculated: 0.17
Monocytes Manual: 2 % (ref 0–11)
Neutrophils %: 78 % — ABNORMAL HIGH (ref 52–75)
Platelets: 256 /mm3 (ref 140–400)
RBC Morphology: NORMAL
RBC: 3.94 /mm3 — ABNORMAL LOW (ref 4.20–5.40)
RDW: 15.9 % — ABNORMAL HIGH (ref 11.5–15.0)
WBC: 8.5 /mm3 (ref 3.50–10.80)

## 2007-04-09 LAB — URINE MICROSCOPIC

## 2007-04-09 LAB — CALCIUM IONIZED-CALC. CERNER: Calcium Ionized Calculated: 2 mEQ/L (ref 1.9–2.3)

## 2007-04-09 LAB — HCG QUANTITATIVE: hCG, Quant.: 36700 m[IU]/mL — AB

## 2007-04-09 LAB — GFR

## 2007-04-24 ENCOUNTER — Emergency Department: Admit: 2007-04-24 | Payer: Self-pay | Source: Emergency Department | Admitting: Emergency Medicine

## 2007-04-24 LAB — CBC AND DIFFERENTIAL
Basophils Absolute: 0 /mm3 (ref 0.0–0.2)
Basophils: 0 % (ref 0–2)
Eosinophils Absolute: 0.1 /mm3 (ref 0.0–0.7)
Eosinophils: 2 % (ref 0–5)
Granulocytes Absolute: 4.9 /mm3 (ref 1.8–8.1)
Hematocrit: 32.3 % — ABNORMAL LOW (ref 37.0–47.0)
Hgb: 11.1 G/DL — ABNORMAL LOW (ref 12.0–16.0)
Immature Granulocytes Absolute: 0 CUMM (ref 0.0–0.0)
Immature Granulocytes: 0 % (ref 0–1)
Lymphocytes Absolute: 1.5 /mm3 (ref 0.5–4.4)
Lymphocytes: 21 % (ref 15–41)
MCH: 26.7 PG — ABNORMAL LOW (ref 28.0–32.0)
MCHC: 34.4 G/DL (ref 32.0–36.0)
MCV: 77.6 FL — ABNORMAL LOW (ref 80.0–100.0)
MPV: 9.4 FL (ref 7.4–10.4)
Monocytes Absolute: 0.4 /mm3 (ref 0.0–1.2)
Monocytes: 6 % (ref 0–11)
Neutrophils %: 71 % (ref 52–75)
Platelets: 248 /mm3 (ref 140–400)
RBC: 4.16 /mm3 — ABNORMAL LOW (ref 4.20–5.40)
RDW: 15.9 % — ABNORMAL HIGH (ref 11.5–15.0)
WBC: 6.89 /mm3 (ref 3.50–10.80)

## 2007-04-24 LAB — URINALYSIS
Bilirubin, UA: NEGATIVE
Blood, UA: NEGATIVE
Glucose, UA: NEGATIVE
Ketones UA: >=80
Nitrite, UA: NEGATIVE
Protein, UR: NEGATIVE
Specific Gravity UA POCT: >=1.03 (ref <=1.030)
Urine pH: 6 (ref 5.0–8.0)
Urobilinogen, UA: 0.2

## 2007-04-24 LAB — URINE MICROSCOPIC

## 2007-04-24 LAB — COMPREHENSIVE METABOLIC PANEL
ALT: 27 U/L (ref 21–72)
AST (SGOT): 22 U/L (ref 8–39)
Albumin/Globulin Ratio: 1 — ABNORMAL LOW (ref 1.1–1.8)
Albumin: 3.6 G/DL — ABNORMAL LOW (ref 3.7–5.1)
Alkaline Phosphatase: 98 U/L (ref 43–122)
BUN: 7 MG/DL (ref 7–21)
Bilirubin, Total: 0.4 MG/DL (ref 0.2–1.3)
CO2: 21 MEQ/L — ABNORMAL LOW (ref 22–31)
Calcium: 9.4 MG/DL (ref 8.6–10.2)
Chloride: 104 MEQ/L (ref 98–107)
Creatinine: 0.5 MG/DL (ref 0.5–1.4)
Globulin: 3.6 G/DL (ref 2.0–3.7)
Glucose: 78 MG/DL (ref 70–105)
Potassium: 3.6 MEQ/L (ref 3.6–5.0)
Protein, Total: 7.2 G/DL (ref 6.0–8.0)
Sodium: 137 MEQ/L (ref 136–143)

## 2007-04-24 LAB — CALCIUM IONIZED-CALC. CERNER: Calcium Ionized Calculated: 2 mEQ/L (ref 1.9–2.3)

## 2007-04-24 LAB — GFR

## 2007-05-18 ENCOUNTER — Emergency Department: Admit: 2007-05-18 | Payer: Self-pay | Source: Emergency Department | Admitting: Pediatric Emergency Medicine

## 2007-05-18 LAB — BASIC METABOLIC PANEL - AH CERNER
Anion Gap: 10 mEq/L (ref 5–15)
BUN: 6 mg/dL (ref 6–20)
CO2: 21.4 mEq/L — ABNORMAL LOW (ref 22.0–29.0)
Calcium: 9.4 mg/dL (ref 8.4–10.2)
Chloride: 105 mEq/L (ref 96–108)
Creatinine: 0.5 mg/dL (ref 0.4–1.1)
Glucose: 110 mg/dL — ABNORMAL HIGH (ref 70–100)
Osmolality Calculated: 280 mosm/kg — ABNORMAL LOW (ref 282–298)
Potassium: 3.9 mEq/L (ref 3.3–5.1)
Sodium: 136 mEq/L (ref 133–145)
UN/CREA SOFT: 12 RATIO (ref 6–33)

## 2007-05-18 LAB — CBC AND DIFFERENTIAL
Basophils Absolute: 0 /mm3 (ref 0.0–0.2)
Basophils: 0 % (ref 0–2)
Eosinophils Absolute: 0.1 /mm3 (ref 0.0–0.7)
Eosinophils: 1 % (ref 0–5)
Granulocytes Absolute: 8.9 /mm3 — ABNORMAL HIGH (ref 1.8–8.1)
Hematocrit: 32.5 % — ABNORMAL LOW (ref 37.0–47.0)
Hgb: 11.3 G/DL — ABNORMAL LOW (ref 12.0–16.0)
Immature Granulocytes Absolute: 0.1 CUMM — ABNORMAL HIGH (ref 0.0–0.0)
Immature Granulocytes: 0 % (ref 0–1)
Lymphocytes Absolute: 2.4 /mm3 (ref 0.5–4.4)
Lymphocytes: 20 % (ref 15–41)
MCH: 27.4 PG — ABNORMAL LOW (ref 28.0–32.0)
MCHC: 34.8 G/DL (ref 32.0–36.0)
MCV: 78.9 FL — ABNORMAL LOW (ref 80.0–100.0)
MPV: 9.7 FL (ref 9.4–12.3)
Monocytes Absolute: 0.5 /mm3 (ref 0.0–1.2)
Monocytes: 5 % (ref 0–11)
Neutrophils %: 74 % (ref 52–75)
Platelets: 308 /mm3 (ref 140–400)
RBC: 4.12 /mm3 — ABNORMAL LOW (ref 4.20–5.40)
RDW: 15.6 % — ABNORMAL HIGH (ref 11.5–15.0)
WBC: 11.91 /mm3 — ABNORMAL HIGH (ref 3.50–10.80)

## 2007-05-18 LAB — URINALYSIS WITH MICROSCOPIC
Bilirubin, UA: NEGATIVE
Blood, UA: NEGATIVE
Glucose, UA: NEGATIVE
Ketones UA: NEGATIVE
Leukocyte Esterase, UA: NEGATIVE
Nitrite, UA: NEGATIVE
Protein, UR: NEGATIVE
Specific Gravity UA POCT: 1.031 — ABNORMAL HIGH (ref 1.005–1.030)
Urine pH: 6 (ref 4.6–8.0)
Urobilinogen, UA: 0.2 EU/dL (ref 0.2–1.0)
WBC, UA: 2 /HPF (ref 0–5)

## 2007-05-18 LAB — GFR

## 2007-05-18 LAB — HEMOLYSIS INDEX: Hemolysis Index: 5 Units

## 2007-06-12 ENCOUNTER — Observation Stay
Admission: RE | Admit: 2007-06-12 | Disposition: A | Discharge status: Home / Self Care | Payer: Self-pay | Source: Ambulatory Visit | Admitting: Obstetrics & Gynecology

## 2007-07-01 ENCOUNTER — Observation Stay
Admission: RE | Admit: 2007-07-01 | Disposition: A | Discharge status: Home / Self Care | Payer: Self-pay | Source: Ambulatory Visit | Admitting: Obstetrics & Gynecology

## 2007-07-01 LAB — COMPREHENSIVE METABOLIC PANEL - AH CERNER
ALT: 12 U/L (ref 0–31)
AST (SGOT): 15 U/L (ref 0–31)
Albumin/Globulin Ratio: 1.1 (ref 1.1–2.2)
Albumin: 3.4 g/dL (ref 3.4–4.8)
Alkaline Phosphatase: 87 U/L (ref 35–104)
Anion Gap: 11 mEq/L (ref 5–15)
BUN: 6 mg/dL (ref 6–20)
Bilirubin, Total: 0.1 mg/dL — AB (ref 0.0–1.0)
CA: 4.1 mEq/L (ref 3.8–4.6)
CO2: 20.4 mEq/L — ABNORMAL LOW (ref 22.0–29.0)
Calcium: 8.9 mg/dL (ref 8.4–10.2)
Chloride: 105 mEq/L (ref 96–108)
Creatinine: 0.5 mg/dL (ref 0.4–1.1)
Globulin: 3 g/dL (ref 2.0–3.6)
Glucose: 170 mg/dL — ABNORMAL HIGH (ref 70–100)
Osmolality Calculated: 284 mosm/kg (ref 282–298)
Potassium: 3.5 mEq/L (ref 3.3–5.1)
Protein, Total: 6.4 g/dL (ref 6.4–8.3)
Sodium: 136 mEq/L (ref 133–145)
UN/CREA SOFT: 12 RATIO (ref 6–33)

## 2007-07-01 LAB — URINALYSIS WITH MICROSCOPIC
Bilirubin, UA: NEGATIVE
Blood, UA: NEGATIVE
Glucose, UA: NEGATIVE
Ketones UA: NEGATIVE
Nitrite, UA: NEGATIVE
Protein, UR: NEGATIVE
Specific Gravity UA POCT: 1.028 (ref 1.005–1.030)
Squamous Epithelial Cells, Urine: 10 /LPF — ABNORMAL HIGH (ref 0–0)
Urine pH: 5.5 (ref 4.6–8.0)
Urobilinogen, UA: 1 EU/dL (ref 0.2–1.0)
WBC, UA: 156 /HPF — ABNORMAL HIGH (ref 0–5)

## 2007-07-01 LAB — DRUG SCREEN,URINE RANDOM CERNER

## 2007-07-01 LAB — TYPE AND SCREEN: AB Screen Gel: NEGATIVE

## 2007-07-01 LAB — CBC AND DIFFERENTIAL
Basophils Absolute: 0 /mm3 (ref 0.0–0.2)
Basophils: 0 % (ref 0–2)
Eosinophils Absolute: 0.1 /mm3 (ref 0.0–0.7)
Eosinophils: 2 % (ref 0–5)
Granulocytes Absolute: 4 /mm3 (ref 1.8–8.1)
Hematocrit: 30.4 % — ABNORMAL LOW (ref 37.0–47.0)
Hgb: 10.4 G/DL — ABNORMAL LOW (ref 12.0–16.0)
Immature Granulocytes Absolute: 0 CUMM (ref 0.0–0.0)
Immature Granulocytes: 0 % (ref 0–1)
Lymphocytes Absolute: 1.1 /mm3 (ref 0.5–4.4)
Lymphocytes: 19 % (ref 15–41)
MCH: 27.4 PG — ABNORMAL LOW (ref 28.0–32.0)
MCHC: 34.2 G/DL (ref 32.0–36.0)
MCV: 80 FL (ref 80.0–100.0)
MPV: 9.5 FL (ref 9.4–12.3)
Monocytes Absolute: 0.5 /mm3 (ref 0.0–1.2)
Monocytes: 8 % (ref 0–11)
Neutrophils %: 71 % (ref 52–75)
Platelets: 261 /mm3 (ref 140–400)
RBC: 3.8 /mm3 — ABNORMAL LOW (ref 4.20–5.40)
RDW: 15.1 % — ABNORMAL HIGH (ref 11.5–15.0)
WBC: 5.61 /mm3 (ref 3.50–10.80)

## 2007-07-01 LAB — FETAL FIBRONECTIN: Fetal Fibronectin: NEGATIVE

## 2007-07-01 LAB — URIC ACID: Uric acid: 5.6 mg/dL (ref 2.4–5.7)

## 2007-07-01 LAB — HEMOLYSIS INDEX: Hemolysis Index: 0 Units

## 2007-07-01 LAB — SICKLE CELL SCREEN: Sickle Screen Test: NEGATIVE

## 2007-07-01 LAB — GFR

## 2007-07-01 LAB — RUBELLA, IGG CERNER: Rubella AB, IgG: 20.3 IU/ML

## 2007-07-01 LAB — LACTATE DEHYDROGENASE: LDH: 159 U/L (ref 94–250)

## 2007-07-02 LAB — HIV AG/AB 4TH GENERATION: HIV 1/2 Antibody: NONREACTIVE

## 2007-07-02 LAB — HEPATITIS B SURFACE ANTIGEN W/ REFLEX TO CONFIRMATION: Hepatitis B Surface Antigen: NEGATIVE

## 2007-07-02 LAB — RPR: RPR: NONREACTIVE

## 2007-07-24 ENCOUNTER — Observation Stay (HOSPITAL_BASED_OUTPATIENT_CLINIC_OR_DEPARTMENT_OTHER)
Admission: RE | Admit: 2007-07-24 | Disposition: A | Discharge status: Home / Self Care | Payer: Self-pay | Source: Ambulatory Visit

## 2007-07-24 LAB — URINALYSIS WITH MICROSCOPIC
Bilirubin, UA: NEGATIVE
Blood, UA: NEGATIVE
Glucose, UA: NEGATIVE
Ketones UA: NEGATIVE
Nitrite, UA: NEGATIVE
Protein, UR: NEGATIVE
Specific Gravity UA POCT: 1.017 (ref 1.001–1.035)
Urine pH: 6 (ref 5.0–8.0)
Urobilinogen, UA: 0.2 EU/dL (ref 0.2–1.0)
WBC, UA: 2 /HPF (ref 0–5)

## 2007-07-24 LAB — FETAL FIBRONECTIN: Fetal Fibronectin: NEGATIVE

## 2007-08-25 ENCOUNTER — Emergency Department: Admit: 2007-08-25 | Payer: Self-pay | Source: Emergency Department | Admitting: Emergency Medicine

## 2007-08-28 ENCOUNTER — Inpatient Hospital Stay (HOSPITAL_BASED_OUTPATIENT_CLINIC_OR_DEPARTMENT_OTHER)
Admission: RE | Admit: 2007-08-28 | Disposition: A | Discharge status: Home / Self Care | Payer: Self-pay | Source: Ambulatory Visit | Admitting: Specialist

## 2007-08-28 LAB — CBC
Hematocrit: 32.5 % — ABNORMAL LOW (ref 37.0–47.0)
Hgb: 10.8 G/DL — ABNORMAL LOW (ref 12.0–16.0)
MCH: 25.9 PG — ABNORMAL LOW (ref 28.0–32.0)
MCHC: 33.2 G/DL (ref 32.0–36.0)
MCV: 77.9 FL — ABNORMAL LOW (ref 80.0–100.0)
MPV: 9 FL — ABNORMAL LOW (ref 9.4–12.3)
Platelets: 293 /mm3 (ref 140–400)
RBC: 4.17 /mm3 — ABNORMAL LOW (ref 4.20–5.40)
RDW: 15.6 % — ABNORMAL HIGH (ref 11.5–15.0)
WBC: 10.55 /mm3 (ref 3.50–10.80)

## 2007-08-29 LAB — TYPE AND SCREEN: AB Screen Gel: NEGATIVE

## 2007-08-30 LAB — CBC
Hematocrit: 29.1 % — ABNORMAL LOW (ref 37.0–47.0)
Hgb: 9.5 G/DL — ABNORMAL LOW (ref 12.0–16.0)
MCH: 25.4 PG — ABNORMAL LOW (ref 28.0–32.0)
MCHC: 32.6 G/DL (ref 32.0–36.0)
MCV: 77.8 FL — ABNORMAL LOW (ref 80.0–100.0)
MPV: 8.7 FL — ABNORMAL LOW (ref 9.4–12.3)
Platelets: 240 /mm3 (ref 140–400)
RBC: 3.74 /mm3 — ABNORMAL LOW (ref 4.20–5.40)
RDW: 15.8 % — ABNORMAL HIGH (ref 11.5–15.0)
WBC: 9.76 /mm3 (ref 3.50–10.80)

## 2007-11-20 ENCOUNTER — Emergency Department: Admit: 2007-11-20 | Payer: Self-pay | Source: Emergency Department | Admitting: Emergency Medicine

## 2008-04-10 ENCOUNTER — Emergency Department: Admit: 2008-04-10 | Payer: Self-pay | Source: Emergency Department | Admitting: Emergency Medicine

## 2008-04-10 LAB — CBC AND DIFFERENTIAL
Basophils Absolute: 0 /mm3 (ref 0.0–0.2)
Basophils: 0 % (ref 0–2)
Eosinophils Absolute: 0.2 /mm3 (ref 0.0–0.7)
Eosinophils: 3 % (ref 0–5)
Granulocytes Absolute: 4.8 /mm3 (ref 1.8–8.1)
Hematocrit: 34.8 % — ABNORMAL LOW (ref 37.0–47.0)
Hgb: 12.3 G/DL (ref 12.0–16.0)
Immature Granulocytes Absolute: 0 CUMM (ref 0.0–0.0)
Immature Granulocytes: 0 % (ref 0–1)
Lymphocytes Absolute: 2.9 /mm3 (ref 0.5–4.4)
Lymphocytes: 35 % (ref 15–41)
MCH: 26.8 PG — ABNORMAL LOW (ref 28.0–32.0)
MCHC: 35.3 G/DL (ref 32.0–36.0)
MCV: 75.8 FL — ABNORMAL LOW (ref 80.0–100.0)
MPV: 10 FL (ref 9.4–12.3)
Monocytes Absolute: 0.5 /mm3 (ref 0.0–1.2)
Monocytes: 6 % (ref 0–11)
Neutrophils %: 57 % (ref 52–75)
Platelets: 388 /mm3 (ref 140–400)
RBC: 4.59 /mm3 (ref 4.20–5.40)
RDW: 15 % (ref 11.5–15.0)
WBC: 8.5 /mm3 (ref 3.50–10.80)

## 2008-04-10 LAB — COMPREHENSIVE METABOLIC PANEL
ALT: 20 U/L — ABNORMAL LOW (ref 21–72)
AST (SGOT): 25 U/L (ref 8–39)
Albumin/Globulin Ratio: 1.1 (ref 1.1–1.8)
Albumin: 4 G/DL (ref 3.7–5.1)
Alkaline Phosphatase: 144 U/L — ABNORMAL HIGH (ref 43–122)
BUN: 9 MG/DL (ref 7–21)
Bilirubin, Total: 0.3 MG/DL (ref 0.2–1.3)
CO2: 24 MEQ/L (ref 22–31)
Calcium: 9.6 MG/DL (ref 8.6–10.2)
Chloride: 97 MEQ/L — ABNORMAL LOW (ref 98–107)
Creatinine: 0.6 MG/DL (ref 0.5–1.4)
Globulin: 3.7 G/DL (ref 2.0–3.7)
Glucose: 406 MG/DL — ABNORMAL HIGH (ref 70–105)
Potassium: 4.2 MEQ/L (ref 3.6–5.0)
Protein, Total: 7.7 G/DL (ref 6.0–8.0)
Sodium: 132 MEQ/L — ABNORMAL LOW (ref 136–143)

## 2008-04-10 LAB — URINALYSIS WITH MICROSCOPIC
Bilirubin, UA: NEGATIVE
Blood, UA: NEGATIVE
Glucose, UA: >=1000
Ketones UA: NEGATIVE
Leukocyte Esterase, UA: NEGATIVE
Nitrite, UA: NEGATIVE
Protein, UR: NEGATIVE
Specific Gravity UA POCT: 1.01 (ref <=1.030)
Urine pH: 6 (ref 5.0–8.0)
Urobilinogen, UA: 0.2

## 2008-04-10 LAB — GFR

## 2008-04-10 LAB — PT/INR
PT INR: 1.1 {INR} (ref 0.9–1.1)
PT: 12.6 s (ref 10.8–13.3)

## 2008-04-10 LAB — LIPASE: Lipase: 55 U/L (ref 23–300)

## 2008-04-10 LAB — AMYLASE: Amylase: 29 U/L — ABNORMAL LOW (ref 30–110)

## 2008-04-10 LAB — URINE HCG QUALITATIVE: Urine HCG Qualitative: NEGATIVE

## 2008-04-10 LAB — CALCIUM IONIZED-CALC. CERNER: Calcium Ionized Calculated: 2 mEQ/L (ref 1.9–2.3)

## 2008-05-02 ENCOUNTER — Emergency Department: Admit: 2008-05-02 | Payer: Self-pay | Source: Emergency Department | Admitting: Family

## 2008-05-02 LAB — URINE HCG QUALITATIVE: Urine HCG Qualitative: NEGATIVE

## 2008-05-13 ENCOUNTER — Emergency Department: Admit: 2008-05-13 | Payer: Self-pay | Source: Emergency Department | Admitting: Emergency Medicine

## 2008-05-13 LAB — CBC AND DIFFERENTIAL
Basophils Absolute: 0 /mm3 (ref 0.0–0.2)
Basophils: 0 % (ref 0–2)
Eosinophils Absolute: 0.3 /mm3 (ref 0.0–0.7)
Eosinophils: 2 % (ref 0–5)
Granulocytes Absolute: 6.8 /mm3 (ref 1.8–8.1)
Hematocrit: 35.3 % — ABNORMAL LOW (ref 37.0–47.0)
Hgb: 12.7 G/DL (ref 12.0–16.0)
Immature Granulocytes Absolute: 0 CUMM (ref 0.0–0.0)
Immature Granulocytes: 0 % (ref 0–1)
Lymphocytes Absolute: 3 /mm3 (ref 0.5–4.4)
Lymphocytes: 28 % (ref 15–41)
MCH: 27.6 PG — ABNORMAL LOW (ref 28.0–32.0)
MCHC: 36 G/DL (ref 32.0–36.0)
MCV: 76.7 FL — ABNORMAL LOW (ref 80.0–100.0)
MPV: 9.9 FL (ref 9.4–12.3)
Monocytes Absolute: 0.6 /mm3 (ref 0.0–1.2)
Monocytes: 5 % (ref 0–11)
Neutrophils %: 64 % (ref 52–75)
Platelets: 366 /mm3 (ref 140–400)
RBC: 4.6 /mm3 (ref 4.20–5.40)
RDW: 15.1 % — ABNORMAL HIGH (ref 11.5–15.0)
WBC: 10.65 /mm3 (ref 3.50–10.80)

## 2008-05-13 LAB — URINALYSIS
Bilirubin, UA: NEGATIVE
Glucose, UA: >=1000
Ketones UA: 15
Leukocyte Esterase, UA: NEGATIVE
Nitrite, UA: NEGATIVE
Protein, UR: NEGATIVE
Specific Gravity UA POCT: <=1.005 (ref <=1.030)
Urine pH: 6 (ref 5.0–8.0)
Urobilinogen, UA: 0.2

## 2008-05-13 LAB — COMPREHENSIVE METABOLIC PANEL
ALT: 12 U/L — ABNORMAL LOW (ref 21–72)
AST (SGOT): 22 U/L (ref 8–39)
Albumin/Globulin Ratio: 1.1 (ref 1.1–1.8)
Albumin: 3.7 G/DL (ref 3.7–5.1)
Alkaline Phosphatase: 158 U/L — ABNORMAL HIGH (ref 43–122)
BUN: 11 MG/DL (ref 7–21)
Bilirubin, Total: 0.2 MG/DL (ref 0.2–1.3)
CO2: 24 MEQ/L (ref 22–31)
Calcium: 9.6 MG/DL (ref 8.6–10.2)
Chloride: 97 MEQ/L — ABNORMAL LOW (ref 98–107)
Creatinine: 0.7 MG/DL (ref 0.5–1.4)
Globulin: 3.5 G/DL (ref 2.0–3.7)
Glucose: 446 MG/DL — ABNORMAL HIGH (ref 70–105)
Potassium: 4.4 MEQ/L (ref 3.6–5.0)
Protein, Total: 7.2 G/DL (ref 6.0–8.0)
Sodium: 130 MEQ/L — ABNORMAL LOW (ref 136–143)

## 2008-05-13 LAB — URINE MICROSCOPIC

## 2008-05-13 LAB — CALCIUM IONIZED-CALC. CERNER: Calcium Ionized Calculated: 2.1 mEQ/L (ref 1.9–2.3)

## 2008-05-13 LAB — URINE HCG QUALITATIVE: Urine HCG Qualitative: NEGATIVE

## 2008-05-13 LAB — GFR

## 2008-05-22 ENCOUNTER — Emergency Department: Admit: 2008-05-22 | Payer: Self-pay | Source: Emergency Department | Admitting: Emergency Medicine

## 2008-06-14 ENCOUNTER — Emergency Department: Admit: 2008-06-14 | Payer: Self-pay | Source: Emergency Department | Admitting: Emergency Medicine

## 2008-06-14 ENCOUNTER — Ambulatory Visit
Admit: 2008-06-14 | Disposition: A | Discharge status: Home / Self Care | Payer: Self-pay | Source: Ambulatory Visit | Admitting: Surgery

## 2008-06-14 LAB — URINALYSIS WITH MICROSCOPIC
Bilirubin, UA: NEGATIVE
Glucose, UA: >=1000
Ketones UA: 15
Nitrite, UA: NEGATIVE
Protein, UR: NEGATIVE
Specific Gravity UA POCT: 1.015 (ref <=1.030)
Urine pH: 6 (ref 5.0–8.0)
Urobilinogen, UA: 0.2

## 2008-06-14 LAB — URINE HCG QUALITATIVE: Urine HCG Qualitative: NEGATIVE

## 2008-06-28 ENCOUNTER — Ambulatory Visit
Admit: 2008-06-28 | Disposition: A | Discharge status: Home / Self Care | Payer: Self-pay | Source: Ambulatory Visit | Admitting: Surgery

## 2008-06-28 LAB — CBC
Hematocrit: 38.4 % (ref 37.0–47.0)
Hgb: 13.3 G/DL (ref 12.0–16.0)
MCH: 27.4 PG — ABNORMAL LOW (ref 28.0–32.0)
MCHC: 34.6 G/DL (ref 32.0–36.0)
MCV: 79 FL — ABNORMAL LOW (ref 80.0–100.0)
MPV: 10 FL (ref 9.4–12.3)
Platelets: 407 /mm3 — ABNORMAL HIGH (ref 140–400)
RBC: 4.86 /mm3 (ref 4.20–5.40)
RDW: 14.5 % (ref 11.5–15.0)
WBC: 9.58 /mm3 (ref 3.50–10.80)

## 2008-06-28 LAB — BASIC METABOLIC PANEL
BUN: 13 MG/DL (ref 7–21)
CO2: 27 MEQ/L (ref 22–31)
Calcium: 10 MG/DL (ref 8.6–10.2)
Chloride: 98 MEQ/L (ref 98–107)
Creatinine: 0.7 MG/DL (ref 0.5–1.4)
Glucose: 359 MG/DL — ABNORMAL HIGH (ref 70–105)
Potassium: 4.8 MEQ/L (ref 3.6–5.0)
Sodium: 133 MEQ/L — ABNORMAL LOW (ref 136–143)

## 2008-06-28 LAB — GFR

## 2008-06-30 ENCOUNTER — Ambulatory Visit
Admit: 2008-06-30 | Disposition: A | Discharge status: Home / Self Care | Payer: Self-pay | Source: Ambulatory Visit | Admitting: Surgery

## 2008-06-30 LAB — URINALYSIS WITH MICROSCOPIC
Bilirubin, UA: NEGATIVE
Glucose, UA: 500
Ketones UA: 25
Nitrite, UA: NEGATIVE
Protein, UR: NEGATIVE
RBC, UA: 2 /HPF (ref 0–3)
Specific Gravity UA POCT: 1.03 (ref 1.005–1.030)
Squamous Epithelial Cells, Urine: 18 /HPF — ABNORMAL HIGH (ref 0–0)
Urine pH: 6 (ref 4.6–8.0)
Urobilinogen, UA: NORMAL mg/dL
WBC, UA: 7 /HPF — ABNORMAL HIGH (ref 0–5)

## 2008-07-02 ENCOUNTER — Emergency Department: Admit: 2008-07-02 | Payer: Self-pay | Source: Emergency Department | Admitting: Emergency Medicine

## 2008-09-14 ENCOUNTER — Emergency Department: Admit: 2008-09-14 | Payer: Self-pay | Source: Emergency Department | Admitting: Family

## 2008-11-15 ENCOUNTER — Emergency Department: Admit: 2008-11-15 | Payer: Self-pay | Source: Emergency Department | Admitting: Emergency Medicine

## 2008-11-22 ENCOUNTER — Emergency Department: Admit: 2008-11-22 | Payer: Self-pay | Source: Emergency Department | Admitting: Family

## 2008-12-09 ENCOUNTER — Inpatient Hospital Stay
Admission: RE | Admit: 2008-12-09 | Disposition: A | Discharge status: Home / Self Care | Payer: Self-pay | Source: Ambulatory Visit | Admitting: Surgery

## 2008-12-10 LAB — BASIC METABOLIC PANEL
BUN: 8 mg/dL (ref 7–19)
CO2: 23 mEq/L (ref 22–29)
Calcium: 8.3 mg/dL — ABNORMAL LOW (ref 8.9–10.0)
Chloride: 102 mEq/L (ref 96–107)
Creatinine: 0.6 mg/dL (ref 0.6–1.0)
Glucose: 121 mg/dL — ABNORMAL HIGH (ref 70–110)
Potassium: 4 mEq/L (ref 3.5–5.1)
Sodium: 135 mEq/L — ABNORMAL LOW (ref 136–145)

## 2008-12-10 LAB — HEMOLYSIS INDEX: Hemolysis Index: 1 Units

## 2008-12-10 LAB — CBC
Hematocrit: 30.5 % — ABNORMAL LOW (ref 37.0–47.0)
Hgb: 10.3 G/DL — ABNORMAL LOW (ref 12.0–16.0)
MCH: 26.4 PG — ABNORMAL LOW (ref 28.0–32.0)
MCHC: 33.8 G/DL (ref 32.0–36.0)
MCV: 78.2 FL — ABNORMAL LOW (ref 80.0–100.0)
MPV: 9.5 FL (ref 9.4–12.3)
Platelets: 302 /mm3 (ref 140–400)
RBC: 3.9 /mm3 — ABNORMAL LOW (ref 4.20–5.40)
RDW: 14.1 % (ref 11.5–15.0)
WBC: 14.28 /mm3 — ABNORMAL HIGH (ref 3.50–10.80)

## 2008-12-10 LAB — GFR

## 2008-12-10 LAB — MAGNESIUM: Magnesium: 1.8 MG/DL (ref 1.3–2.6)

## 2008-12-11 LAB — CBC AND DIFFERENTIAL
Basophils Absolute: 0 /mm3 (ref 0.0–0.2)
Basophils: 0 % (ref 0–2)
Eosinophils Absolute: 0.6 /mm3 (ref 0.0–0.7)
Eosinophils: 6 % — ABNORMAL HIGH (ref 0–5)
Granulocytes Absolute: 7 /mm3 (ref 1.8–8.1)
Hematocrit: 30.8 % — ABNORMAL LOW (ref 37.0–47.0)
Hgb: 10.4 G/DL — ABNORMAL LOW (ref 12.0–16.0)
Immature Granulocytes Absolute: 0 CUMM (ref 0.0–0.0)
Immature Granulocytes: 0 % (ref 0–1)
Lymphocytes Absolute: 2.1 /mm3 (ref 0.5–4.4)
Lymphocytes: 20 % (ref 15–41)
MCH: 26.7 PG — ABNORMAL LOW (ref 28.0–32.0)
MCHC: 33.8 G/DL (ref 32.0–36.0)
MCV: 79 FL — ABNORMAL LOW (ref 80.0–100.0)
MPV: 9.4 FL (ref 9.4–12.3)
Monocytes Absolute: 0.6 /mm3 (ref 0.0–1.2)
Monocytes: 5 % (ref 0–11)
Neutrophils %: 69 % (ref 52–75)
Platelets: 316 /mm3 (ref 140–400)
RBC: 3.9 /mm3 — ABNORMAL LOW (ref 4.20–5.40)
RDW: 14.3 % (ref 11.5–15.0)
WBC: 10.26 /mm3 (ref 3.50–10.80)

## 2008-12-12 LAB — CBC AND DIFFERENTIAL
Basophils Absolute: 0 /mm3 (ref 0.0–0.2)
Basophils: 0 % (ref 0–2)
Eosinophils Absolute: 0.6 /mm3 (ref 0.0–0.7)
Eosinophils: 7 % — ABNORMAL HIGH (ref 0–5)
Granulocytes Absolute: 5.6 /mm3 (ref 1.8–8.1)
Hematocrit: 30.6 % — ABNORMAL LOW (ref 37.0–47.0)
Hgb: 10.3 G/DL — ABNORMAL LOW (ref 12.0–16.0)
Immature Granulocytes Absolute: 0 CUMM (ref 0.0–0.0)
Immature Granulocytes: 0 % (ref 0–1)
Lymphocytes Absolute: 1.7 /mm3 (ref 0.5–4.4)
Lymphocytes: 20 % (ref 15–41)
MCH: 26.5 PG — ABNORMAL LOW (ref 28.0–32.0)
MCHC: 33.7 G/DL (ref 32.0–36.0)
MCV: 78.7 FL — ABNORMAL LOW (ref 80.0–100.0)
MPV: 9.3 FL — ABNORMAL LOW (ref 9.4–12.3)
Monocytes Absolute: 0.6 /mm3 (ref 0.0–1.2)
Monocytes: 7 % (ref 0–11)
Neutrophils %: 66 % (ref 52–75)
Platelets: 309 /mm3 (ref 140–400)
RBC: 3.89 /mm3 — ABNORMAL LOW (ref 4.20–5.40)
RDW: 14.1 % (ref 11.5–15.0)
WBC: 8.5 /mm3 (ref 3.50–10.80)

## 2009-02-03 ENCOUNTER — Emergency Department: Admit: 2009-02-03 | Payer: Self-pay | Source: Emergency Department | Admitting: Family

## 2009-02-09 ENCOUNTER — Emergency Department: Admit: 2009-02-09 | Payer: Self-pay | Source: Emergency Department | Admitting: Emergency Medicine

## 2009-05-10 ENCOUNTER — Emergency Department: Admit: 2009-05-10 | Payer: Self-pay | Source: Emergency Department

## 2009-05-14 ENCOUNTER — Emergency Department: Admit: 2009-05-14 | Payer: Self-pay | Source: Emergency Department | Admitting: Primary Care

## 2009-05-20 ENCOUNTER — Emergency Department: Admit: 2009-05-20 | Payer: Self-pay | Source: Emergency Department | Admitting: Family

## 2009-07-12 ENCOUNTER — Emergency Department: Admit: 2009-07-12 | Payer: Self-pay | Source: Emergency Department | Admitting: Emergency Medicine

## 2009-07-12 LAB — URINALYSIS, REFLEX TO MICROSCOPIC EXAM IF INDICATED
Bilirubin, UA: NEGATIVE
Blood, UA: NEGATIVE
Glucose, UA: NEGATIVE
Ketones UA: NEGATIVE
Nitrite, UA: NEGATIVE
Protein, UR: 30 — AB
Specific Gravity UA POCT: 1.01 (ref 1.001–1.035)
Urine pH: 7 (ref 5.0–8.0)
Urobilinogen, UA: 8 mg/dL — ABNORMAL HIGH

## 2009-07-12 LAB — URINE HCG QUALITATIVE: Urine HCG Qualitative: POSITIVE — AB

## 2009-07-18 ENCOUNTER — Emergency Department: Admit: 2009-07-18 | Payer: Self-pay | Source: Emergency Department | Admitting: Emergency Medicine

## 2009-07-18 LAB — COMPREHENSIVE METABOLIC PANEL
ALT: 21 U/L (ref 21–72)
AST (SGOT): 17 U/L (ref 8–39)
Albumin/Globulin Ratio: 1 — ABNORMAL LOW (ref 1.1–1.8)
Albumin: 3.9 g/dL (ref 3.7–5.1)
Alkaline Phosphatase: 102 U/L (ref 43–122)
BUN: 14 mg/dL (ref 7–21)
Bilirubin, Total: 0.1 mg/dL — ABNORMAL LOW (ref 0.2–1.3)
CO2: 26 mEq/L (ref 22–31)
Calcium: 9.6 mg/dL (ref 8.6–10.2)
Chloride: 104 mEq/L (ref 98–107)
Creatinine: 0.8 mg/dL (ref 0.5–1.4)
Globulin: 4.1 g/dL — ABNORMAL HIGH (ref 2.0–3.7)
Glucose: 111 mg/dL — ABNORMAL HIGH (ref 70–100)
Potassium: 4 mEq/L (ref 3.6–5.0)
Protein, Total: 8 g/dL (ref 6.0–8.0)
Sodium: 140 mEq/L (ref 136–143)

## 2009-07-18 LAB — CBC WITH MANUAL DIFFERENTIAL
Band Neutrophils: 0 % (ref 0–9)
Bands Absolute: 0 10*3/uL (ref 0.00–1.00)
Baso(Absolute): 0 10*3/uL (ref 0.00–0.20)
Basophils: 0 % (ref 0–2)
Cell Morphology: NORMAL
Eosinophils Absolute: 0.1 10*3/uL (ref 0.00–0.70)
Eosinophils: 1 % (ref 0–5)
Hematocrit: 36.1 % — ABNORMAL LOW (ref 37.0–47.0)
Hgb: 12 g/dL (ref 12.0–16.0)
Lymphocytes Absolute: 2.3 10*3/uL (ref 0.50–4.40)
Lymphocytes: 20 % (ref 15–41)
MCH: 26.5 pg — ABNORMAL LOW (ref 28.0–32.0)
MCHC: 33.2 g/dL (ref 32.0–36.0)
MCV: 79.7 fL — ABNORMAL LOW (ref 80.0–100.0)
MPV: 9.5 fL (ref 9.4–12.3)
Monocytes Absolute: 0.5 10*3/uL (ref 0.00–1.20)
Monocytes: 4 % (ref 0–11)
Neutrophils Absolute Count: 8.8 10*3/uL — ABNORMAL HIGH (ref 1.80–8.10)
Neutrophils: 75 % (ref 52–75)
Platelets: 360 10*3/uL (ref 140–400)
RBC: 4.53 10*6/uL (ref 4.20–5.40)
RDW: 16 % — ABNORMAL HIGH (ref 12–15)
WBC: 11.74 10*3/uL — ABNORMAL HIGH (ref 3.50–10.80)

## 2009-07-18 LAB — URINALYSIS, REFLEX TO MICROSCOPIC EXAM IF INDICATED
Bilirubin, UA: NEGATIVE
Glucose, UA: NEGATIVE
Ketones UA: NEGATIVE
Nitrite, UA: NEGATIVE
Protein, UR: 30 — AB
Specific Gravity UA POCT: 1.025 (ref 1.001–1.035)
Urine pH: 5 (ref 5.0–8.0)
Urobilinogen, UA: NORMAL mg/dL

## 2009-07-18 LAB — URINE HCG QUALITATIVE: Urine HCG Qualitative: NEGATIVE

## 2009-07-18 LAB — LIPASE: Lipase: 50 U/L (ref 23–300)

## 2009-07-18 LAB — GFR: EGFR: >60

## 2009-07-26 ENCOUNTER — Emergency Department: Admit: 2009-07-26 | Payer: Self-pay | Source: Emergency Department | Admitting: Emergency Medicine

## 2009-08-09 ENCOUNTER — Emergency Department: Admit: 2009-08-09 | Payer: Self-pay | Source: Emergency Department | Admitting: Emergency Medicine

## 2009-09-26 ENCOUNTER — Emergency Department: Admit: 2009-09-26 | Payer: Self-pay | Source: Emergency Department | Admitting: Emergency Medicine

## 2009-09-26 LAB — CBC AND DIFFERENTIAL
Baso(Absolute): 0.02 10*3/uL (ref 0.00–0.20)
Basophils: 0 % (ref 0–2)
Eosinophils Absolute: 0.21 10*3/uL (ref 0.00–0.70)
Eosinophils: 2 % (ref 0–5)
Hematocrit: 31.2 % — ABNORMAL LOW (ref 37.0–47.0)
Hgb: 10.2 g/dL — ABNORMAL LOW (ref 12.0–16.0)
Immature Granulocytes Absolute: 0.02 10*3/uL
Immature Granulocytes: 0 % (ref 0–1)
Lymphocytes Absolute: 2.4 10*3/uL (ref 0.50–4.40)
Lymphocytes: 24 % (ref 15–41)
MCH: 25.8 pg — ABNORMAL LOW (ref 28.0–32.0)
MCHC: 32.7 g/dL (ref 32.0–36.0)
MCV: 78.8 fL — ABNORMAL LOW (ref 80.0–100.0)
MPV: 9.5 fL (ref 9.4–12.3)
Monocytes Absolute: 0.44 10*3/uL (ref 0.00–1.20)
Monocytes: 4 % (ref 0–11)
Neutrophils Absolute: 6.8 10*3/uL
Neutrophils: 69 % (ref 52–75)
Platelets: 387 10*3/uL (ref 140–400)
RBC: 3.96 10*6/uL — ABNORMAL LOW (ref 4.20–5.40)
RDW: 15 % (ref 12–15)
WBC: 9.87 10*3/uL (ref 3.50–10.80)

## 2009-09-26 LAB — COMPREHENSIVE METABOLIC PANEL
ALT: 14 U/L — ABNORMAL LOW (ref 21–72)
AST (SGOT): 16 U/L (ref 8–39)
Albumin/Globulin Ratio: 1 — ABNORMAL LOW (ref 1.1–1.8)
Albumin: 3.5 g/dL — ABNORMAL LOW (ref 3.7–5.1)
Alkaline Phosphatase: 93 U/L (ref 43–122)
BUN: 12 mg/dL (ref 7–21)
Bilirubin, Total: 0.1 mg/dL — ABNORMAL LOW (ref 0.2–1.3)
CO2: 26 mEq/L (ref 22–31)
Calcium: 9 mg/dL (ref 8.6–10.2)
Chloride: 103 mEq/L (ref 98–107)
Creatinine: 0.7 mg/dL (ref 0.5–1.4)
Globulin: 3.5 g/dL (ref 2.0–3.7)
Glucose: 132 mg/dL — ABNORMAL HIGH (ref 70–100)
Potassium: 3.9 mEq/L (ref 3.6–5.0)
Protein, Total: 7 g/dL (ref 6.0–8.0)
Sodium: 137 mEq/L (ref 136–143)

## 2009-09-26 LAB — CK: Creatine Kinase (CK): 135 U/L (ref 20–140)

## 2009-09-26 LAB — LIPASE: Lipase: 40 U/L (ref 23–300)

## 2009-09-26 LAB — GFR: EGFR: >60

## 2009-09-27 LAB — TROPONIN I
Troponin I: <0.01 ng/mL (ref 0.00–0.03)
Troponin I: <0.01 ng/mL (ref 0.00–0.03)

## 2009-09-27 LAB — CK: Creatine Kinase (CK): 128 U/L (ref 20–140)

## 2009-09-27 LAB — D-DIMER - SOFT: D-Dimer: 299 ng/ml DDU (ref 0–399)

## 2009-10-06 ENCOUNTER — Emergency Department: Admit: 2009-10-06 | Payer: Self-pay | Source: Emergency Department | Admitting: Emergency Medicine

## 2009-11-05 ENCOUNTER — Emergency Department: Admit: 2009-11-05 | Payer: Self-pay | Source: Emergency Department | Admitting: Emergency Medicine

## 2009-11-05 LAB — CBC AND DIFFERENTIAL
Baso(Absolute): 0.02 10*3/uL (ref 0.00–0.20)
Basophils: 0 % (ref 0–2)
Eosinophils Absolute: 0.24 10*3/uL (ref 0.00–0.70)
Eosinophils: 2 % (ref 0–5)
Hematocrit: 32 % — ABNORMAL LOW (ref 37.0–47.0)
Hgb: 10.5 g/dL — ABNORMAL LOW (ref 12.0–16.0)
Immature Granulocytes Absolute: 0.02 10*3/uL
Immature Granulocytes: 0 % (ref 0–1)
Lymphocytes Absolute: 2.39 10*3/uL (ref 0.50–4.40)
Lymphocytes: 25 % (ref 15–41)
MCH: 25.5 pg — ABNORMAL LOW (ref 28.0–32.0)
MCHC: 32.8 g/dL (ref 32.0–36.0)
MCV: 77.7 fL — ABNORMAL LOW (ref 80.0–100.0)
MPV: 9.7 fL (ref 9.4–12.3)
Monocytes Absolute: 0.51 10*3/uL (ref 0.00–1.20)
Monocytes: 5 % (ref 0–11)
Neutrophils Absolute: 6.42 10*3/uL
Neutrophils: 67 % (ref 52–75)
Platelets: 354 10*3/uL (ref 140–400)
RBC: 4.12 10*6/uL — ABNORMAL LOW (ref 4.20–5.40)
RDW: 15 % (ref 12–15)
WBC: 9.58 10*3/uL (ref 3.50–10.80)

## 2009-11-05 LAB — URINALYSIS, REFLEX TO MICROSCOPIC EXAM IF INDICATED
Bilirubin, UA: NEGATIVE
Blood, UA: NEGATIVE
Glucose, UA: NEGATIVE
Ketones UA: NEGATIVE
Nitrite, UA: NEGATIVE
Protein, UR: NEGATIVE
Specific Gravity UA POCT: 1.025 (ref 1.001–1.035)
Urine pH: 6 (ref 5.0–8.0)
Urobilinogen, UA: NORMAL mg/dL

## 2009-11-05 LAB — GFR: EGFR: >60

## 2009-11-05 LAB — BASIC METABOLIC PANEL
BUN: 12 mg/dL (ref 7–21)
CO2: 26 mEq/L (ref 22–31)
Calcium: 9 mg/dL (ref 8.6–10.2)
Chloride: 102 mEq/L (ref 98–107)
Creatinine: 0.7 mg/dL (ref 0.5–1.4)
Glucose: 152 mg/dL — ABNORMAL HIGH (ref 70–100)
Potassium: 3.8 mEq/L (ref 3.6–5.0)
Sodium: 137 mEq/L (ref 136–143)

## 2009-11-05 LAB — URINE HCG QUALITATIVE: Urine HCG Qualitative: NEGATIVE

## 2009-12-07 ENCOUNTER — Emergency Department: Admit: 2009-12-07 | Payer: Self-pay | Source: Emergency Department | Admitting: Family

## 2009-12-07 LAB — URINE HCG QUALITATIVE: Urine HCG Qualitative: NEGATIVE

## 2010-01-19 ENCOUNTER — Emergency Department: Admit: 2010-01-19 | Payer: Self-pay | Source: Emergency Department | Admitting: Emergency Medicine

## 2010-02-10 ENCOUNTER — Emergency Department: Admit: 2010-02-10 | Payer: Self-pay | Source: Emergency Department | Admitting: Emergency Medicine

## 2010-02-10 LAB — COMPREHENSIVE METABOLIC PANEL
ALT: 10 U/L (ref 0–55)
AST (SGOT): 15 U/L (ref 5–34)
Albumin/Globulin Ratio: 0.8 — ABNORMAL LOW (ref 0.9–2.2)
Albumin: 3.3 g/dL — ABNORMAL LOW (ref 3.5–5.0)
Alkaline Phosphatase: 98 U/L (ref 40–150)
Anion Gap: 10 (ref 5.0–15.0)
BUN: 14 mg/dL (ref 7.0–19.0)
Bilirubin, Total: <0.1 mg/dL — ABNORMAL LOW (ref 0.2–1.2)
CO2: 22 mEq/L (ref 22–29)
Calcium: 9.6 mg/dL (ref 8.5–10.5)
Chloride: 103 mEq/L (ref 98–107)
Creatinine: 0.7 mg/dL (ref 0.6–1.0)
Globulin: 4.1 g/dL — ABNORMAL HIGH (ref 2.0–3.6)
Glucose: 129 mg/dL — ABNORMAL HIGH (ref 70–100)
Potassium: 4.2 mEq/L (ref 3.5–5.1)
Protein, Total: 7.4 g/dL (ref 6.0–8.3)
Sodium: 135 mEq/L — ABNORMAL LOW (ref 136–145)

## 2010-02-10 LAB — CBC AND DIFFERENTIAL
Baso(Absolute): 0.03 10*3/uL (ref 0.00–0.20)
Basophils: 0 % (ref 0–2)
Eosinophils Absolute: 0.22 10*3/uL (ref 0.00–0.70)
Eosinophils: 2 % (ref 0–5)
Hematocrit: 33.4 % — ABNORMAL LOW (ref 37.0–47.0)
Hgb: 11.4 g/dL — ABNORMAL LOW (ref 12.0–16.0)
Immature Granulocytes Absolute: 0.02 10*3/uL
Immature Granulocytes: 0 % (ref 0–1)
Lymphocytes Absolute: 2.59 10*3/uL (ref 0.50–4.40)
Lymphocytes: 29 % (ref 15–41)
MCH: 26.4 pg — ABNORMAL LOW (ref 28.0–32.0)
MCHC: 34.1 g/dL (ref 32.0–36.0)
MCV: 77.3 fL — ABNORMAL LOW (ref 80.0–100.0)
MPV: 9.8 fL (ref 9.4–12.3)
Monocytes Absolute: 0.43 10*3/uL (ref 0.00–1.20)
Monocytes: 5 % (ref 0–11)
Neutrophils Absolute: 5.71 10*3/uL
Neutrophils: 64 % (ref 52–75)
Platelets: 363 10*3/uL (ref 140–400)
RBC: 4.32 10*6/uL (ref 4.20–5.40)
RDW: 16 % — ABNORMAL HIGH (ref 12–15)
WBC: 8.98 10*3/uL (ref 3.50–10.80)

## 2010-02-10 LAB — HCG QUANTITATIVE: hCG, Quant.: <1.2 m[IU]/mL

## 2010-02-10 LAB — URINALYSIS, REFLEX TO MICROSCOPIC EXAM IF INDICATED
Bilirubin, UA: NEGATIVE
Blood, UA: NEGATIVE
Glucose, UA: NEGATIVE
Ketones UA: NEGATIVE
Nitrite, UA: NEGATIVE
Protein, UR: NEGATIVE
RBC, UA: 2 /HPF (ref 0–5)
Specific Gravity UA POCT: 1.01 (ref 1.001–1.035)
Squamous Epithelial Cells, Urine: 3 /HPF (ref 0–25)
Urine pH: 5 (ref 5.0–8.0)
Urobilinogen, UA: NORMAL mg/dL

## 2010-02-10 LAB — LIPASE: Lipase: 15 U/L (ref 8–78)

## 2010-02-10 LAB — HEMOLYSIS INDEX: Hemolysis Index: 5 Index (ref 0–18)

## 2010-02-10 LAB — GFR: EGFR: >60

## 2010-03-09 ENCOUNTER — Emergency Department: Admit: 2010-03-09 | Payer: Self-pay | Source: Emergency Department | Admitting: Emergency Medicine

## 2010-03-14 ENCOUNTER — Emergency Department: Admit: 2010-03-14 | Payer: Self-pay | Source: Emergency Department | Admitting: Emergency Medicine

## 2010-03-14 LAB — URINALYSIS, REFLEX TO MICROSCOPIC EXAM IF INDICATED
Bilirubin, UA: NEGATIVE
Blood, UA: NEGATIVE
Glucose, UA: NEGATIVE
Ketones UA: NEGATIVE
Nitrite, UA: NEGATIVE
Protein, UR: NEGATIVE
RBC, UA: 6 /HPF (ref 0–5)
Specific Gravity UA POCT: 1.015 (ref 1.001–1.035)
Squamous Epithelial Cells, Urine: 14 /HPF (ref 0–25)
Urine pH: 6 (ref 5.0–8.0)
Urobilinogen, UA: 2 mg/dL
WBC, UA: 9 /HPF (ref 0–5)

## 2010-03-19 ENCOUNTER — Emergency Department: Admit: 2010-03-19 | Payer: Self-pay | Source: Emergency Department | Admitting: Emergency Medicine

## 2010-06-03 ENCOUNTER — Emergency Department: Admit: 2010-06-03 | Payer: Self-pay | Source: Emergency Department | Admitting: Emergency Medicine

## 2010-07-08 ENCOUNTER — Emergency Department
Admit: 2010-07-08 | Disposition: A | Discharge status: Home / Self Care | Payer: Self-pay | Source: Emergency Department | Admitting: Emergency Medicine

## 2010-08-05 ENCOUNTER — Emergency Department
Admit: 2010-08-05 | Disposition: A | Discharge status: Home / Self Care | Payer: Self-pay | Source: Emergency Department | Admitting: Emergency Medicine

## 2010-08-13 ENCOUNTER — Emergency Department: Admit: 2010-08-13 | Disposition: A | Discharge status: Home / Self Care | Payer: Self-pay | Source: Emergency Department

## 2010-08-13 LAB — URINALYSIS, REFLEX TO MICROSCOPIC EXAM IF INDICATED
Bilirubin, UA: NEGATIVE
Blood, UA: NEGATIVE
Glucose, UA: NEGATIVE
Ketones UA: NEGATIVE
Nitrite, UA: NEGATIVE
Protein, UR: NEGATIVE
Specific Gravity UA POCT: 1.015 (ref 1.001–1.035)
Urine pH: 6 (ref 5.0–8.0)
Urobilinogen, UA: NORMAL mg/dL

## 2010-09-14 ENCOUNTER — Emergency Department
Admit: 2010-09-14 | Disposition: A | Discharge status: Home / Self Care | Payer: Self-pay | Source: Emergency Department | Admitting: Emergency Medicine

## 2010-09-14 LAB — GROUP A STREP, RAPID ANTIGEN: Group A Strep, Rapid Antigen: NEGATIVE

## 2010-09-17 LAB — THROAT CULTURE: Culture Throat: NORMAL

## 2010-10-03 ENCOUNTER — Emergency Department
Admit: 2010-10-03 | Disposition: A | Discharge status: Home / Self Care | Payer: Self-pay | Source: Emergency Department | Admitting: Emergency Medicine

## 2010-10-13 ENCOUNTER — Ambulatory Visit: Admit: 2010-10-13 | Discharge: 2010-10-13 | Payer: Self-pay | Source: Ambulatory Visit | Admitting: Emergency Medicine

## 2010-10-21 ENCOUNTER — Emergency Department
Admit: 2010-10-21 | Disposition: A | Discharge status: Home / Self Care | Payer: Self-pay | Source: Emergency Department | Admitting: Emergency Medicine

## 2010-10-31 ENCOUNTER — Emergency Department
Admit: 2010-10-31 | Disposition: A | Discharge status: Home / Self Care | Payer: Self-pay | Source: Emergency Department | Admitting: Emergency Medicine

## 2010-11-08 ENCOUNTER — Emergency Department
Admit: 2010-11-08 | Disposition: A | Discharge status: Home / Self Care | Payer: Self-pay | Source: Emergency Department | Admitting: Emergency Medicine

## 2010-11-08 LAB — URINALYSIS, REFLEX TO MICROSCOPIC EXAM IF INDICATED
Bilirubin, UA: NEGATIVE
Blood, UA: NEGATIVE
Glucose, UA: NEGATIVE
Ketones UA: NEGATIVE
Nitrite, UA: NEGATIVE
Protein, UR: 30 — AB
Specific Gravity UA POCT: 1.02 (ref 1.001–1.035)
Urine pH: 6 (ref 5.0–8.0)
Urobilinogen, UA: 2 mg/dL

## 2010-11-08 LAB — URINE HCG QUALITATIVE: Urine HCG Qualitative: NEGATIVE

## 2010-11-13 ENCOUNTER — Emergency Department
Admit: 2010-11-13 | Disposition: A | Discharge status: Home / Self Care | Payer: Self-pay | Source: Emergency Department | Admitting: Emergency Medicine

## 2010-11-19 ENCOUNTER — Emergency Department
Admit: 2010-11-19 | Disposition: A | Discharge status: Home / Self Care | Payer: Self-pay | Source: Emergency Department | Admitting: Family

## 2010-12-07 ENCOUNTER — Emergency Department: Admit: 2010-12-07 | Disposition: A | Discharge status: Home / Self Care | Payer: Self-pay | Source: Emergency Department

## 2010-12-07 LAB — CBC AND DIFFERENTIAL
Basophils Absolute Automated: 0.02 10*3/uL (ref 0.00–0.20)
Basophils Automated: 0 % (ref 0–2)
Eosinophils Absolute Automated: 0.16 10*3/uL (ref 0.00–0.70)
Eosinophils Automated: 2 % (ref 0–5)
Hematocrit: 34.1 % — ABNORMAL LOW (ref 37.0–47.0)
Hgb: 11.4 g/dL — ABNORMAL LOW (ref 12.0–16.0)
Immature Granulocytes Absolute: 0.02 10*3/uL
Immature Granulocytes: 0 % (ref 0–1)
Lymphocytes Absolute Automated: 2.47 10*3/uL (ref 0.50–4.40)
Lymphocytes Automated: 29 % (ref 15–41)
MCH: 25.9 pg — ABNORMAL LOW (ref 28.0–32.0)
MCHC: 33.4 g/dL (ref 32.0–36.0)
MCV: 77.5 fL — ABNORMAL LOW (ref 80.0–100.0)
MPV: 9.6 fL (ref 9.4–12.3)
Monocytes Absolute Automated: 0.48 10*3/uL (ref 0.00–1.20)
Monocytes: 6 % (ref 0–11)
Neutrophils Absolute: 5.45 10*3/uL (ref 1.80–8.10)
Neutrophils: 64 % (ref 52–75)
Nucleated RBC: 0 /100 WBC
Platelets: 351 10*3/uL (ref 140–400)
RBC: 4.4 10*6/uL (ref 4.20–5.40)
RDW: 15 % (ref 12–15)
WBC: 8.58 10*3/uL (ref 3.50–10.80)

## 2010-12-07 LAB — COMPREHENSIVE METABOLIC PANEL
ALT: 10 U/L — ABNORMAL LOW (ref 21–72)
AST (SGOT): 18 U/L (ref 8–39)
Albumin/Globulin Ratio: 1.1 (ref 1.1–1.8)
Albumin: 4 g/dL (ref 3.7–5.1)
Alkaline Phosphatase: 93 U/L (ref 43–122)
BUN: 13 mg/dL (ref 7–21)
Bilirubin, Total: 0.4 mg/dL (ref 0.2–1.3)
CO2: 26 mEq/L (ref 22–31)
Calcium: 9.4 mg/dL (ref 8.6–10.2)
Chloride: 104 mEq/L (ref 98–107)
Creatinine: 0.7 mg/dL (ref 0.5–1.4)
Globulin: 3.7 g/dL (ref 2.0–3.7)
Glucose: 106 mg/dL — ABNORMAL HIGH (ref 70–100)
Potassium: 4.4 mEq/L (ref 3.6–5.0)
Protein, Total: 7.7 g/dL (ref 6.0–8.0)
Sodium: 139 mEq/L (ref 136–143)

## 2010-12-07 LAB — GFR: EGFR: >60

## 2011-01-01 ENCOUNTER — Emergency Department
Admit: 2011-01-01 | Disposition: A | Discharge status: Home / Self Care | Payer: Self-pay | Source: Emergency Department | Admitting: Emergency Medicine

## 2011-01-06 LAB — ECG 12-LEAD
Atrial Rate: 83 {beats}/min
P Axis: 36 degrees
P-R Interval: 148 ms
Q-T Interval: 392 ms
QRS Duration: 106 ms
QTC Calculation (Bezet): 460 ms
R Axis: -3 degrees
T Axis: 18 degrees
Ventricular Rate: 83 {beats}/min

## 2011-01-10 LAB — ECG 12-LEAD
Atrial Rate: 81 {beats}/min
Atrial Rate: 90 {beats}/min
P Axis: 48 degrees
P Axis: 67 degrees
P-R Interval: 150 ms
P-R Interval: 156 ms
Q-T Interval: 368 ms
Q-T Interval: 374 ms
QRS Duration: 94 ms
QRS Duration: 98 ms
QTC Calculation (Bezet): 434 ms
QTC Calculation (Bezet): 450 ms
R Axis: 10 degrees
R Axis: 9 degrees
T Axis: 23 degrees
T Axis: 25 degrees
Ventricular Rate: 81 {beats}/min
Ventricular Rate: 90 {beats}/min

## 2011-01-12 LAB — ECG 12-LEAD
Atrial Rate: 80 {beats}/min
Atrial Rate: 82 {beats}/min
P Axis: 41 degrees
P Axis: 60 degrees
P-R Interval: 148 ms
P-R Interval: 160 ms
Q-T Interval: 378 ms
Q-T Interval: 404 ms
QRS Duration: 100 ms
QRS Duration: 98 ms
QTC Calculation (Bezet): 441 ms
QTC Calculation (Bezet): 465 ms
R Axis: 4 degrees
R Axis: 9 degrees
T Axis: 10 degrees
T Axis: 13 degrees
Ventricular Rate: 80 {beats}/min
Ventricular Rate: 82 {beats}/min

## 2011-01-29 ENCOUNTER — Emergency Department
Admit: 2011-01-29 | Disposition: A | Discharge status: Home / Self Care | Payer: Self-pay | Source: Emergency Department | Admitting: Emergency Medicine

## 2011-01-30 ENCOUNTER — Emergency Department: Admit: 2011-01-30 | Disposition: A | Discharge status: Home / Self Care | Payer: Self-pay | Source: Emergency Department

## 2011-01-30 LAB — ECG 12-LEAD
Atrial Rate: 92 {beats}/min
P Axis: 62 degrees
P-R Interval: 138 ms
Q-T Interval: 358 ms
QRS Duration: 92 ms
QTC Calculation (Bezet): 442 ms
R Axis: 11 degrees
T Axis: 26 degrees
Ventricular Rate: 92 {beats}/min

## 2011-02-01 LAB — ECG 12-LEAD
Atrial Rate: 78 {beats}/min
P Axis: 44 degrees
P-R Interval: 158 ms
Q-T Interval: 390 ms
QRS Duration: 94 ms
QTC Calculation (Bezet): 444 ms
R Axis: 15 degrees
T Axis: 31 degrees
Ventricular Rate: 78 {beats}/min

## 2011-02-02 ENCOUNTER — Emergency Department
Admit: 2011-02-02 | Disposition: A | Discharge status: Home / Self Care | Payer: Self-pay | Source: Emergency Department | Admitting: Emergency Medicine

## 2011-02-02 LAB — URINALYSIS, REFLEX TO MICROSCOPIC EXAM IF INDICATED
Bilirubin, UA: NEGATIVE
Glucose, UA: 150 — AB
Ketones UA: NEGATIVE
Leukocyte Esterase, UA: NEGATIVE
Nitrite, UA: NEGATIVE
Protein, UR: 30 — AB
Specific Gravity UA POCT: 1.025 (ref 1.001–1.035)
Urine pH: 6 (ref 5.0–8.0)
Urobilinogen, UA: NORMAL mg/dL

## 2011-02-02 LAB — URINE HCG QUALITATIVE: Urine HCG Qualitative: NEGATIVE

## 2011-02-11 NOTE — Discharge Summary (Signed)
Account Number: [id number]      Document ID: [id number]      Admit Date: 12/09/2008      Discharge Date: 12/12/2008            ATTENDING PHYSICIAN:  Valeria Batman, MD                  ADMITTING DIAGNOSIS:      Incarcerated incisional hernia.            DISCHARGE DIAGNOSIS:      Incarcerated incisional hernia.            PROCEDURE:      Incisional hernia repair with component separation.            PAST MEDICAL HISTORY:      Significant for diabetes type 2, obesity, asthma.            MEDICATIONS:      Include docusate 100 mg twice a day, metformin 1000 mg daily, Naprosyn 100      mg twice a day, and Vicodin 1 to 2 tabs q.4 h. as needed for pain.            HOSPITAL COURSE:      The patient underwent an incisional hernia repair by Dr. Allyne Gee that was      uncomplicated with some components separation.  The patient was then      admitted postoperatively and diet was slowly advanced after a day.  Her      pain was well controlled on Vicodin for pain and she was ambulating with      assistance.  She had physical therapy and occupational therapy      consultations during her visitation and a home health nurse was set up for      assistance at home prior to discharge.  She was voiding freely without a      catheter at the time of discharge as well.  There were no complications      during her stay.  She did have a JP drain placed at the time of the      procedure and she went home with instructions on how to treat the JP drain      as well.  The patient is to follow up with Dr. Allyne Gee in a week after      surgery.                        Electronic Signing Provider      _______________________________     Date/Time Signed: _____________      Delena Serve MD ([id number])            D:  12/23/2008 08:38 AM by Dr. Magnus Sinning. Ned Grace, MD ([id number])      T:  12/23/2008 08:54 AM by [email or web address]          Everlean Cherry: [id number]) (Doc ID: [id number])                        MV:[email or web address] Ned Grace MD

## 2011-02-11 NOTE — Op Note (Unsigned)
DATE OF BIRTH:                        Sep 06, 1975      ADMISSION DATE:                     08/28/2007            PATIENT LOCATION:                    C1[facility] [id number]            DATE OF PROCEDURE:                   08/29/2007      SURGEON:                            Edwena Felty, MD      ASSISTANT(S):                  PREOPERATIVE DIAGNOSES:      1.   37-WEEK PREGNANCY.      2.   PRIOR CESAREAN SECTION.      3.   NONREASSURING FETAL STATUS.            POSTOPERATIVE DIAGNOSES:      1.   37-WEEK PREGNANCY.      2.   PRIOR CESAREAN SECTION.      3.   NONREASSURING FETAL STATUS.      4.   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            TITLE OF PROCEDURE:      1.   REPEAT LOW SEGMENT TRANSVERSE CESAREAN SECTION.      2.   BILATERAL TUBAL LIGATION.            ANESTHESIA:  Spinal            ESTIMATED BLOOD LOSS:  400 mL.            COMPLICATIONS:  None.            CONDITION:  Stable to recovery room.            FINDINGS:  A female infant, Apgars 4 and 7, weight 6 pounds 2 ounces, clear      amniotic fluid, normal bilateral fallopian tubes and ovaries.            DESCRIPTION OF PROCEDURE:  The patient was brought into the operating room.      Spinal anesthesia was performed.  The patient was placed in the dorsal      supine position with a left tilt.  The patient's abdomen was prepped and      draped in the usual sterile fashion.  A midline vertical incision was made      in the lower abdomen.  The fascia was incised vertically.  The peritoneum      was entered by sharp dissection.  Adhesions were encountered to the omentum      and the anterior abdominal wall.  Lysis of adhesions was performed.  The      lower segment of the uterus was exposed.  The uterine incision was made      transversely in the lower segment.  The infant was delivered in cephalic      presentation.  The mouth and nose were suctioned upon delivery of the  infant's head, and the rest of the infant's body was easily delivered.  The      cord was doubly clamped and  transected.  The infant was handed to the      pediatric team waiting in the operating room.  The placenta was removed      manually.  The uterine incision was closed with 0 Monocryl suture in a      running locked fashion.  Bilateral tubal ligation was then performed.            The bilateral tubes were ligated with 2-0 plain gut suture.  The knuckle of      tube formed between the sutures was trimmed off and sent to Pathology.      Excellent hemostasis was assured.  The fascia along with the peritoneum was      closed with 0 double PDS sutures.  The skin was closed with 4-0 Monocryl      suture in a subcuticular fashion.            Instrument, lap pads and needle counts were correct.  The patient tolerated      the procedure well and was transferred to the recovery room in stable      condition.                                          ___________________________________          Date Signed: __________      Edwena Felty, MD  ([id number])            D: 08/29/2007 by Edwena Felty, MD      T: 08/29/2007 by [email or web address] (O:[id number]) (O:[id number])      cc:  Edwena Felty, MD

## 2011-02-11 NOTE — Op Note (Signed)
Account Number: [id number]      Document ID: [id number]      Admit Date: 12/09/2008      Procedure Date: 12/09/2008            Patient Location: A2817-A      Patient Type: V            SURGEON: Bary Richard. MD      ASSISTANT:                  PREOPERATIVE DIAGNOSIS:      Large abdominal wall incisional hernia.            POSTOPERATIVE DIAGNOSIS:      Large abdominal wall incisional hernia.            PROCEDURE PERFORMED:      Open repair of a very large incisional hernia at Pfannenstiel site with      composite BARD mesh.            FIRST ASSISTANT:      Dr. Marina Goodell.            SECOND ASSISTANT:      Lerry Liner.            ANESTHESIA:      General endotracheal.            ESTIMATED BLOOD LOSS:      Minimal.            INTRAVENOUS FLUID REPLACEMENT:      2 L of crystalloid.            IMPLANTS:      BARD composite mesh.            DRAINS:      A 19 Blake in the subcutaneous tissue.            INDICATIONS FOR THE PROCEDURE:      A painful symptomatic incisional hernia.            SPECIMENS:      Hernia sac and fatty contents.            DESCRIPTION OF PROCEDURE:      The patient was identified in the preoperative holding area.  The consent      was reviewed for her.  Each line of the risks and complications were      explained to her that were underlined.  She asked appropriated questions      then agreed to surgery.  It was explained to her that from the CT scan      evaluation that it appeared that her hernia was most likely from the      Pfannenstiel site and not from the midline vertical site; therefore, most      likely her incision would be in a Pfannenstiel direction.  She asked that      if there is any chance to go up and down to repair the hernia more      appropriately that would have preferred that site.  I explained to her that      if that site is the site of the hernia, which we will have a better idea of      knowing from when she gets relaxed from anesthesia then we would go through      the up and  down incision, but most likely from the CAT scan results her      hernia was from the Pfannenstiel site.  She understood and agreed to      whatever incision will be appropriate.            She was given Ancef, which served as wound prophylaxis and sequential      compression devices for DVT prophylaxis and the plan was to schedule her      Lovenox after the surgery if we did not encounter any serious bleeding.            She was interviewed by anesthesia, then taken to the OR and underwent      general endotracheal anesthesia, then prepped and draped in a standard      fashion.  Upon inspecting the midline vertical incision we did not feel any      palpable hernia that was there and break in the fascia.  This correlated      with what we saw in CT scan.  We then felt at the Pfannenstiel site and      felt a defect that was at the Pfannenstiel incision site, which was more to      the right of the Pfannenstiel incision site, so at this point the most      prudent decision was made to go in through the Pfannenstiel incision,      mostly toward the right side.            We did this.  We went through the skin and just as we get through the      subcutaneous we encountered the sac of the hernia.  We carefully dissected      around the hernia.  We took our time in cleaning off the fascia around the      hernia defect to make sure we had adequate fascial exposure so when we put      our stitches in, this did take a great deal of time because of the      patient's body habitus and the amount of adipose tissue we had to clear off      the fascia.            When we got to the actual hernia site we then opened up the sac, reduced      most of the contents except for omentum, which was adhesed to the hernia      sac wall.  We dissected off the wall omentum and sent it to pathology      as hernia sac with contents.  We measured the hernia and its greatest      diameter was about 11 cm.  With this the decision was made to use  a      synthetic mesh because there was no history of previous wound infection at      this site or previous history of repair of hernia at this site.  We placed      a BARD composite mesh in an underlay fashion.  We used #1 as well as #2      Prolene to tack in the right angles furthest to the right lateral, left      lateral, superior, and inferior.  Then we used 0 Prolene to bisect each of      those angles.  We did place 2 additional sutures at areas where we felt      that it was not flush up against the fascia wall in the underlay position.      We were very  careful not to include any small intestines or bowel in our      sutures, which we used to tack the mesh up to the abdominal wall.  We      placed the sutures into the abdominal wall, then went into the mesh prior      to the mesh being placed inside the patient, and then back up through the      abdominal wall.  Therefore, all the sutures were placed prior to the mesh      being placed into the person until the very end.  We did place 2 additional      sutures, but being careful not to go all the way through the mesh to get      small bowel.            When we felt that the mesh was flush up against the posterior wall of the      fascia and we tied them all in place we then decided to close the fascia      over the mesh using #2 Prolene in a figure-of-eight fashion.  We used 2 of      these in order to close this fairly sizable defect.  We then irrigated out      the wound copiously and placed a 19-blade drain over the fascia.  We placed      a couple of #3 Vicryl for deep myocutaneous closure and then used staples      to close the skin.  The patient had tolerated this procedure very well.  It      was a long and arduous procedure due to the size of the patient, as well as      the size of the hernia and the Pfannenstiel location and the need to      retract the patient's pannus superiorly.  The case took approximately 2      hours.                         Electronic Signing Provider      _______________________________     Date/Time Signed: _____________      Bary Richard MD ([id number])            D:  12/09/2008 16:32 PM by Dr. Bary Richard., MD ([id number])      T:  12/09/2008 18:58 PM by [email or web address]          (Conf: [id number]) (Doc ID: [id number])                  UU:[email or web address] Jenkins Rouge. MD
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
## 2011-02-12 ENCOUNTER — Emergency Department
Admit: 2011-02-12 | Discharge: 2011-02-12 | Disposition: A | Discharge status: Home / Self Care | Payer: Self-pay | Source: Emergency Department | Admitting: Family

## 2011-02-21 ENCOUNTER — Emergency Department
Admit: 2011-02-21 | Discharge: 2011-02-21 | Disposition: A | Discharge status: Home / Self Care | Payer: Self-pay | Source: Emergency Department | Admitting: Emergency Medicine

## 2011-02-21 LAB — URINALYSIS, REFLEX TO MICROSCOPIC EXAM IF INDICATED
Bilirubin, UA: NEGATIVE
Blood, UA: NEGATIVE
Glucose, UA: 500 — AB
Ketones UA: NEGATIVE
Nitrite, UA: NEGATIVE
Protein, UR: NEGATIVE
Specific Gravity UA POCT: 1.015 (ref 1.001–1.035)
Urine pH: 7 (ref 5.0–8.0)
Urobilinogen, UA: NORMAL mg/dL

## 2011-02-21 LAB — URINE HCG QUALITATIVE: Urine HCG Qualitative: NEGATIVE

## 2011-02-21 LAB — CBC AND DIFFERENTIAL
Basophils Absolute Automated: 0.03 10*3/uL (ref 0.00–0.20)
Basophils Automated: 0 % (ref 0–2)
Eosinophils Absolute Automated: 0.16 10*3/uL (ref 0.00–0.70)
Eosinophils Automated: 2 % (ref 0–5)
Hematocrit: 33 % — ABNORMAL LOW (ref 37.0–47.0)
Hgb: 11.1 g/dL — ABNORMAL LOW (ref 12.0–16.0)
Immature Granulocytes Absolute: 0.03 10*3/uL
Immature Granulocytes: 0 % (ref 0–1)
Lymphocytes Absolute Automated: 2.84 10*3/uL (ref 0.50–4.40)
Lymphocytes Automated: 29 % (ref 15–41)
MCH: 26.1 pg — ABNORMAL LOW (ref 28.0–32.0)
MCHC: 33.6 g/dL (ref 32.0–36.0)
MCV: 77.5 fL — ABNORMAL LOW (ref 80.0–100.0)
MPV: 9.9 fL (ref 9.4–12.3)
Monocytes Absolute Automated: 0.4 10*3/uL (ref 0.00–1.20)
Monocytes: 4 % (ref 0–11)
Neutrophils Absolute: 6.25 10*3/uL (ref 1.80–8.10)
Neutrophils: 65 % (ref 52–75)
Nucleated RBC: 0 /100 WBC
Platelets: 394 10*3/uL (ref 140–400)
RBC: 4.26 10*6/uL (ref 4.20–5.40)
RDW: 14 % (ref 12–15)
WBC: 9.68 10*3/uL (ref 3.50–10.80)

## 2011-02-21 LAB — COMPREHENSIVE METABOLIC PANEL
ALT: 32 U/L (ref 21–72)
AST (SGOT): 18 U/L (ref 8–39)
Albumin/Globulin Ratio: 1.1 (ref 1.1–1.8)
Albumin: 3.9 g/dL (ref 3.7–5.1)
Alkaline Phosphatase: 106 U/L (ref 43–122)
BUN: 12 mg/dL (ref 7–21)
Bilirubin, Total: 0.3 mg/dL (ref 0.2–1.3)
CO2: 27 mEq/L (ref 22–31)
Calcium: 9.4 mg/dL (ref 8.6–10.2)
Chloride: 96 mEq/L — ABNORMAL LOW (ref 98–107)
Creatinine: 0.7 mg/dL (ref 0.5–1.4)
Globulin: 3.7 g/dL (ref 2.0–3.7)
Glucose: 349 mg/dL — ABNORMAL HIGH (ref 70–100)
Potassium: 4.2 mEq/L (ref 3.6–5.0)
Protein, Total: 7.6 g/dL (ref 6.0–8.0)
Sodium: 135 mEq/L — ABNORMAL LOW (ref 136–143)

## 2011-02-21 LAB — GFR: EGFR: >60

## 2011-03-18 ENCOUNTER — Emergency Department
Admit: 2011-03-18 | Discharge: 2011-03-18 | Disposition: A | Discharge status: Home / Self Care | Payer: Self-pay | Source: Emergency Department | Admitting: Emergency Medicine

## 2011-03-18 LAB — CBC AND DIFFERENTIAL
Basophils Absolute Automated: 0.03 10*3/uL (ref 0.00–0.20)
Basophils Automated: 0 % (ref 0–2)
Eosinophils Absolute Automated: 0.17 10*3/uL (ref 0.00–0.70)
Eosinophils Automated: 2 % (ref 0–5)
Hematocrit: 32.4 % — ABNORMAL LOW (ref 37.0–47.0)
Hgb: 11.3 g/dL — ABNORMAL LOW (ref 12.0–16.0)
Immature Granulocytes Absolute: 0.04 10*3/uL
Immature Granulocytes: 1 % (ref 0–1)
Lymphocytes Absolute Automated: 2.58 10*3/uL (ref 0.50–4.40)
Lymphocytes Automated: 30 % (ref 15–41)
MCH: 26.7 pg — ABNORMAL LOW (ref 28.0–32.0)
MCHC: 34.9 g/dL (ref 32.0–36.0)
MCV: 76.6 fL — ABNORMAL LOW (ref 80.0–100.0)
MPV: 9.8 fL (ref 9.4–12.3)
Monocytes Absolute Automated: 0.5 10*3/uL (ref 0.00–1.20)
Monocytes: 6 % (ref 0–11)
Neutrophils Absolute: 5.2 10*3/uL (ref 1.80–8.10)
Neutrophils: 61 % (ref 52–75)
Nucleated RBC: 0 /100 WBC
Platelets: 349 10*3/uL (ref 140–400)
RBC: 4.23 10*6/uL (ref 4.20–5.40)
RDW: 15 % (ref 12–15)
WBC: 8.48 10*3/uL (ref 3.50–10.80)

## 2011-03-18 LAB — URINALYSIS, REFLEX TO MICROSCOPIC EXAM IF INDICATED
Bilirubin, UA: NEGATIVE
Blood, UA: NEGATIVE
Glucose, UA: >=1000 — AB
Ketones UA: NEGATIVE
Nitrite, UA: NEGATIVE
Protein, UR: NEGATIVE
Specific Gravity UA POCT: 1.01 (ref 1.001–1.035)
Urine pH: 6 (ref 5.0–8.0)
Urobilinogen, UA: NORMAL mg/dL

## 2011-03-18 LAB — GFR: EGFR: >60

## 2011-03-18 LAB — COMPREHENSIVE METABOLIC PANEL
ALT: 38 U/L (ref 21–72)
AST (SGOT): 18 U/L (ref 8–39)
Albumin/Globulin Ratio: 1.2 (ref 1.1–1.8)
Albumin: 3.5 g/dL — ABNORMAL LOW (ref 3.7–5.1)
Alkaline Phosphatase: 106 U/L (ref 43–122)
BUN: 11 mg/dL (ref 7–21)
Bilirubin, Total: 0.3 mg/dL (ref 0.2–1.3)
CO2: 23 mEq/L (ref 22–31)
Calcium: 9 mg/dL (ref 8.6–10.2)
Chloride: 99 mEq/L (ref 98–107)
Creatinine: 0.6 mg/dL (ref 0.5–1.4)
Globulin: 3 g/dL (ref 2.0–3.7)
Glucose: 427 mg/dL — ABNORMAL HIGH (ref 70–100)
Potassium: 4.1 mEq/L (ref 3.6–5.0)
Protein, Total: 6.5 g/dL (ref 6.0–8.0)
Sodium: 133 mEq/L — ABNORMAL LOW (ref 136–143)

## 2011-03-18 LAB — URINE HCG QUALITATIVE: Urine HCG Qualitative: NEGATIVE

## 2011-03-31 ENCOUNTER — Emergency Department
Admit: 2011-03-31 | Discharge: 2011-03-31 | Disposition: A | Discharge status: Home / Self Care | Payer: Self-pay | Source: Emergency Department | Admitting: Emergency Medicine

## 2011-04-02 ENCOUNTER — Emergency Department
Admit: 2011-04-02 | Discharge: 2011-04-02 | Disposition: A | Discharge status: Home / Self Care | Payer: Self-pay | Source: Emergency Department | Admitting: Emergency Medicine

## 2011-04-03 LAB — URINALYSIS, REFLEX TO MICROSCOPIC EXAM IF INDICATED
Bilirubin, UA: NEGATIVE
Glucose, UA: >=1000 — AB
Ketones UA: NEGATIVE
Nitrite, UA: NEGATIVE
Protein, UR: 100 — AB
Specific Gravity UA POCT: 1.01 (ref 1.001–1.035)
Urine pH: 8 (ref 5.0–8.0)
Urobilinogen, UA: NORMAL mg/dL

## 2011-04-03 LAB — BASIC METABOLIC PANEL
BUN: 12 mg/dL (ref 7–21)
CO2: 23 mEq/L (ref 22–31)
Calcium: 9.8 mg/dL (ref 8.6–10.2)
Chloride: 98 mEq/L (ref 98–107)
Creatinine: 0.6 mg/dL (ref 0.5–1.4)
Glucose: 418 mg/dL — ABNORMAL HIGH (ref 70–100)
Potassium: 4.3 mEq/L (ref 3.6–5.0)
Sodium: 134 mEq/L — ABNORMAL LOW (ref 136–143)

## 2011-04-03 LAB — GFR: EGFR: >60

## 2011-04-04 ENCOUNTER — Emergency Department
Admit: 2011-04-04 | Discharge: 2011-04-04 | Disposition: A | Discharge status: Home / Self Care | Payer: Self-pay | Source: Emergency Department | Admitting: Emergency Medicine

## 2011-04-08 ENCOUNTER — Emergency Department
Admit: 2011-04-08 | Discharge: 2011-04-08 | Disposition: A | Discharge status: Home / Self Care | Payer: Self-pay | Source: Emergency Department | Admitting: Family

## 2011-04-08 LAB — URINE HCG QUALITATIVE: Urine HCG Qualitative: NEGATIVE

## 2011-04-12 ENCOUNTER — Emergency Department
Admit: 2011-04-12 | Discharge: 2011-04-12 | Disposition: A | Discharge status: Home / Self Care | Payer: Self-pay | Source: Emergency Department | Admitting: Emergency Medicine

## 2011-05-01 ENCOUNTER — Emergency Department
Admit: 2011-05-01 | Discharge: 2011-05-01 | Disposition: A | Discharge status: Home / Self Care | Payer: Self-pay | Source: Emergency Department | Admitting: Emergency Medicine

## 2011-05-20 ENCOUNTER — Emergency Department
Admit: 2011-05-20 | Discharge: 2011-05-20 | Disposition: A | Discharge status: Home / Self Care | Payer: Self-pay | Source: Emergency Department | Admitting: Emergency Medicine

## 2011-05-20 LAB — CBC AND DIFFERENTIAL
Basophils Absolute Automated: 0.03 10*3/uL (ref 0.00–0.20)
Basophils Automated: 0 % (ref 0–2)
Eosinophils Absolute Automated: 0.14 10*3/uL (ref 0.00–0.70)
Eosinophils Automated: 2 % (ref 0–5)
Hematocrit: 34.2 % — ABNORMAL LOW (ref 37.0–47.0)
Hgb: 11.7 g/dL — ABNORMAL LOW (ref 12.0–16.0)
Immature Granulocytes Absolute: 0.02 10*3/uL
Immature Granulocytes: 0 % (ref 0–1)
Lymphocytes Absolute Automated: 2.68 10*3/uL (ref 0.50–4.40)
Lymphocytes Automated: 29 % (ref 15–41)
MCH: 26.6 pg — ABNORMAL LOW (ref 28.0–32.0)
MCHC: 34.2 g/dL (ref 32.0–36.0)
MCV: 77.7 fL — ABNORMAL LOW (ref 80.0–100.0)
MPV: 9.8 fL (ref 9.4–12.3)
Monocytes Absolute Automated: 0.5 10*3/uL (ref 0.00–1.20)
Monocytes: 6 % (ref 0–11)
Neutrophils Absolute: 5.82 10*3/uL (ref 1.80–8.10)
Neutrophils: 64 % (ref 52–75)
Nucleated RBC: 0 /100 WBC
Platelets: 360 10*3/uL (ref 140–400)
RBC: 4.4 10*6/uL (ref 4.20–5.40)
RDW: 15 % (ref 12–15)
WBC: 9.17 10*3/uL (ref 3.50–10.80)

## 2011-05-20 LAB — BASIC METABOLIC PANEL
BUN: 16 mg/dL (ref 7–21)
CO2: 26 mEq/L (ref 22–31)
Calcium: 9.5 mg/dL (ref 8.6–10.2)
Chloride: 98 mEq/L (ref 98–107)
Creatinine: 0.9 mg/dL (ref 0.5–1.4)
Glucose: 298 mg/dL — ABNORMAL HIGH (ref 70–100)
Potassium: 4.2 mEq/L (ref 3.6–5.0)
Sodium: 132 mEq/L — ABNORMAL LOW (ref 136–143)

## 2011-05-20 LAB — URINALYSIS, REFLEX TO MICROSCOPIC EXAM IF INDICATED
Bilirubin, UA: NEGATIVE
Blood, UA: NEGATIVE
Glucose, UA: 500 — AB
Ketones UA: 25 — AB
Nitrite, UA: NEGATIVE
Protein, UR: NEGATIVE
Specific Gravity UA POCT: 1.015 (ref 1.001–1.035)
Urine pH: 6 (ref 5.0–8.0)
Urobilinogen, UA: NORMAL mg/dL

## 2011-05-20 LAB — URINE HCG QUALITATIVE: Urine HCG Qualitative: NEGATIVE

## 2011-05-20 LAB — GFR: EGFR: >60

## 2011-05-21 LAB — ECG 12-LEAD
Atrial Rate: 81 {beats}/min
P Axis: 60 degrees
P-R Interval: 150 ms
Q-T Interval: 394 ms
QRS Duration: 98 ms
QTC Calculation (Bezet): 457 ms
R Axis: 5 degrees
T Axis: 14 degrees
Ventricular Rate: 81 {beats}/min

## 2011-05-24 ENCOUNTER — Emergency Department
Admit: 2011-05-24 | Discharge: 2011-05-24 | Disposition: A | Discharge status: Home / Self Care | Payer: Self-pay | Source: Emergency Department | Admitting: Family

## 2011-05-25 ENCOUNTER — Emergency Department
Admit: 2011-05-25 | Discharge: 2011-05-25 | Disposition: A | Discharge status: Home / Self Care | Payer: Self-pay | Source: Emergency Department | Admitting: Emergency Medicine

## 2011-05-25 LAB — CBC AND DIFFERENTIAL
Basophils Absolute Automated: 0.02 10*3/uL (ref 0.00–0.20)
Basophils Automated: 0 % (ref 0–2)
Eosinophils Absolute Automated: 0.1 10*3/uL (ref 0.00–0.70)
Eosinophils Automated: 1 % (ref 0–5)
Hematocrit: 35.5 % — ABNORMAL LOW (ref 37.0–47.0)
Hgb: 12.3 g/dL (ref 12.0–16.0)
Immature Granulocytes Absolute: 0.02 10*3/uL
Immature Granulocytes: 0 % (ref 0–1)
Lymphocytes Absolute Automated: 2.37 10*3/uL (ref 0.50–4.40)
Lymphocytes Automated: 29 % (ref 15–41)
MCH: 26.9 pg — ABNORMAL LOW (ref 28.0–32.0)
MCHC: 34.6 g/dL (ref 32.0–36.0)
MCV: 77.7 fL — ABNORMAL LOW (ref 80.0–100.0)
MPV: 10 fL (ref 9.4–12.3)
Monocytes Absolute Automated: 0.43 10*3/uL (ref 0.00–1.20)
Monocytes: 5 % (ref 0–11)
Neutrophils Absolute: 5.32 10*3/uL (ref 1.80–8.10)
Neutrophils: 65 % (ref 52–75)
Nucleated RBC: 0 /100 WBC
Platelets: 391 10*3/uL (ref 140–400)
RBC: 4.57 10*6/uL (ref 4.20–5.40)
RDW: 15 % (ref 12–15)
WBC: 8.24 10*3/uL (ref 3.50–10.80)

## 2011-05-25 LAB — COMPREHENSIVE METABOLIC PANEL
ALT: 11 U/L (ref 0–55)
AST (SGOT): 12 U/L (ref 5–34)
Albumin/Globulin Ratio: 0.8 — ABNORMAL LOW (ref 0.9–2.2)
Albumin: 3.3 g/dL — ABNORMAL LOW (ref 3.5–5.0)
Alkaline Phosphatase: 96 U/L (ref 40–150)
Anion Gap: 9 (ref 5.0–15.0)
BUN: 13 mg/dL (ref 7.0–19.0)
Bilirubin, Total: 0.3 mg/dL (ref 0.2–1.2)
CO2: 22 mEq/L (ref 22–29)
Calcium: 9.8 mg/dL (ref 8.5–10.5)
Chloride: 102 mEq/L (ref 98–107)
Creatinine: 0.8 mg/dL (ref 0.6–1.0)
Globulin: 3.9 g/dL — ABNORMAL HIGH (ref 2.0–3.6)
Glucose: 321 mg/dL — ABNORMAL HIGH (ref 70–100)
Potassium: 4.2 mEq/L (ref 3.5–5.1)
Protein, Total: 7.2 g/dL (ref 6.0–8.3)
Sodium: 133 mEq/L — ABNORMAL LOW (ref 136–145)

## 2011-05-25 LAB — TROPONIN I: Troponin I: <0.01 ng/mL (ref 0.00–0.09)

## 2011-05-25 LAB — HEMOLYSIS INDEX: Hemolysis Index: 1 Index (ref 0–18)

## 2011-05-25 LAB — GFR: EGFR: >60

## 2011-05-26 LAB — ECG 12-LEAD
Atrial Rate: 89 {beats}/min
P Axis: 56 degrees
P-R Interval: 142 ms
Q-T Interval: 366 ms
QRS Duration: 94 ms
QTC Calculation (Bezet): 445 ms
R Axis: 12 degrees
T Axis: 38 degrees
Ventricular Rate: 89 {beats}/min

## 2011-06-11 ENCOUNTER — Emergency Department
Admit: 2011-06-11 | Discharge: 2011-06-11 | Disposition: A | Discharge status: Home / Self Care | Payer: Self-pay | Source: Emergency Department | Admitting: Family

## 2011-06-28 ENCOUNTER — Emergency Department
Admit: 2011-06-28 | Discharge: 2011-06-28 | Disposition: A | Discharge status: Home / Self Care | Payer: Self-pay | Source: Emergency Department | Admitting: Family

## 2011-07-25 ENCOUNTER — Emergency Department
Admit: 2011-07-25 | Discharge: 2011-07-25 | Disposition: A | Discharge status: Home / Self Care | Payer: Self-pay | Source: Emergency Department | Admitting: Family

## 2011-09-18 ENCOUNTER — Emergency Department
Admit: 2011-09-18 | Discharge: 2011-09-18 | Disposition: A | Discharge status: Home / Self Care | Payer: Self-pay | Source: Emergency Department | Admitting: Emergency Medicine

## 2011-10-12 ENCOUNTER — Emergency Department
Admit: 2011-10-12 | Discharge: 2011-10-12 | Disposition: A | Discharge status: Home / Self Care | Payer: Self-pay | Source: Emergency Department | Admitting: Family

## 2011-11-10 ENCOUNTER — Emergency Department: Payer: Medicare Other

## 2011-11-10 ENCOUNTER — Emergency Department
Admission: EM | Admit: 2011-11-10 | Discharge: 2011-11-10 | Disposition: A | Discharge status: Home / Self Care | Payer: Medicare Other | Attending: Family | Admitting: Family

## 2011-11-10 DIAGNOSIS — J45909 Unspecified asthma, uncomplicated: Secondary | ICD-10-CM | POA: Insufficient documentation

## 2011-11-10 DIAGNOSIS — W19XXXA Unspecified fall, initial encounter: Secondary | ICD-10-CM | POA: Insufficient documentation

## 2011-11-10 DIAGNOSIS — E119 Type 2 diabetes mellitus without complications: Secondary | ICD-10-CM | POA: Insufficient documentation

## 2011-11-10 DIAGNOSIS — S93409A Sprain of unspecified ligament of unspecified ankle, initial encounter: Secondary | ICD-10-CM

## 2011-11-10 HISTORY — DX: Type 2 diabetes mellitus without complications: E11.9

## 2011-11-10 HISTORY — DX: Unspecified asthma, uncomplicated: J45.909

## 2011-11-10 MED ORDER — IBUPROFEN 600 MG PO TABS
600.00 mg | ORAL_TABLET | Freq: Once | ORAL | Status: AC
Start: 2011-11-10 — End: 2011-11-10
  Administered 2011-11-10: 600 mg via ORAL
  Filled 2011-11-10: qty 1

## 2011-11-10 MED ORDER — IBUPROFEN 600 MG PO TABS
600.00 mg | ORAL_TABLET | Freq: Four times a day (QID) | ORAL | Status: DC | PRN
Start: 2011-11-10 — End: 2011-11-15

## 2011-11-10 NOTE — Discharge Instructions (Signed)
Ankle Sprain    You have been diagnosed with an ankle sprain.    A sprain is a ligament injury, usually a tear or partial tear. Sprains can hurt as much as broken bones. Sprains can be classified by the degree of injury. A first-degree sprain is considered a minor tear. A second-degree sprain is a partial tear of the ligament. A third-degree sprain often involves a small fracture, or break, of the bone that the ligament is attached to.    Sprains are usually treated with pain medication and a splint to keep the joint from moving. You should Rest, Ice, Compress, and Elevate the injured ankle. Remember this as "RICE."   REST: Limit the use of the injured body part.   ICE: By applying ice to the affected area, swelling and pain can be reduced. Place some ice cubes in a re-sealable (Ziploc) bag and add some water. Put a thin washcloth between the bag and the skin. Apply the ice bag to the area for at least 20 minutes. Do this at least 4 times per day. Using the ice for longer times and more frequently is OK. NEVER APPLY ICE DIRECTLY TO THE SKIN.   COMPRESS: Compression means to apply pressure around the injured area such as with a splint, cast or an ace bandage. Compression decreases swelling and improves comfort. Compression should be tight enough to relieve swelling but not so tight as to decrease circulation. Increasing pain, numbness, tingling, or change in skin color, are all signs of decreased circulation.   ELEVATE: Elevate the injured part. For example, a sprained ankle can be placed up on a chair while sitting and propped up on pillows while lying down.    You have been given an ACE BANDAGE. The bandage will compress the ankle. This increases comfort and reduces swelling. The ACE bandage should fit snugly but not so tight as to decrease circulation (blood supply). Watch for swelling of the area outside the ace wrap. Check capillary refill (circulation) in your toenails. To do this, press on the nail. It  should turn white. When you let go, the nail should return to pink in less than 2 seconds. If it doesn't, the bandage is too tight. Loosen the wrap if you need to.    Wear the ACE bandage:   For the next 1-2 weeks.    Ankle exercises are described below. Begin the exercises as soon as you are able. They will make the ankle stronger to prevent new injuries. Do the exercises 5 to 10 times each day.   Use your big toe to draw out the letters of the alphabet on the ground. Move your ankle as you make each letter.   Sit with your leg straight out in front of you. Wrap a towel around the ball of your foot (just below your toes) and pull back. Pull hard enough to stretch the ankle. Don't pull hard enough to cause pain. Hold the stretch for 30 seconds.   Stand up. Rock onto the tiptoes of the injured foot and then return to the flat position. Repeat 10 times.   Rotate your ankle in a circle. Make 10 clockwise circles, then make 10 circles going the other way.    YOU SHOULD SEEK MEDICAL ATTENTION IMMEDIATELY, EITHER HERE OR AT THE NEAREST EMERGENCY DEPARTMENT, IF ANY OF THE FOLLOWING OCCURS:   Your pain gets much worse.   Your ankle or foot starts to tingle or it becomes numb.   Your foot   is cold or pale. This might mean there is a problem with circulation (blood supply).

## 2011-11-10 NOTE — ED Notes (Signed)
Bed:M 01<BR> Expected date:<BR> Expected time:<BR> Means of arrival:[id number] - [place]<BR> Comments:<BR>

## 2011-11-10 NOTE — ED Notes (Signed)
Patient states that she was walking and fell in a grate. C/o right ankle pain.

## 2011-11-10 NOTE — ED Provider Notes (Signed)
Physician/Midlevel provider first contact with patient: 11/10/11 1410         History     Chief Complaint   Patient presents with   . Leg Injury     right ankle     Patient is a 36 y.o. female presenting with leg pain. The history is provided by the patient. No language interpreter was used.   Leg Pain   Incident onset: PTA. The incident occurred at home. The injury mechanism was a fall. The pain is present in the right ankle. The pain is moderate. The pain has been constant since onset. Associated symptoms include inability to bear weight. She reports no foreign bodies present. Exacerbated by: movement. She has tried nothing for the symptoms. Improvement on treatment: N/A.       36 y/o female brought by EMS to ED due to right ankle pain that occurred PTA while pt was walking and fell down. Pt denies neck and back pain. Pt also reports L ankle and R lower leg pain  Past Medical History   Diagnosis Date   . Diabetes mellitus without complication    . Asthma without status asthmaticus        Past Surgical History   Procedure Date   . Cesarean section    . Cholecystectomy    . Tonsillectomy    . Hernia repair        No family history on file.    Social  History   Substance Use Topics   . Smoking status: Never Smoker    . Smokeless tobacco: Not on file   . Alcohol Use: No       .     Allergies   Allergen Reactions   . Percocet (Oxycodone-Acetaminophen)        Current/Home Medications    METFORMIN (GLUCOPHAGE) 1000 MG TABLET    Take 1,000 mg by mouth 2 (two) times daily with meals.        Review of Systems   Musculoskeletal:        Notes right ankle pain and pain near right knee.   All other systems reviewed and are negative.        Physical Exam    BP 122/59  Pulse 83  Temp 98 F (36.7 C)  Resp 18  Ht 1.803 m  Wt 99.338 kg  BMI 30.56 kg/m2  SpO2 98%  LMP 10/27/2011    Physical Exam  Nursing note and vitals reviewed.  Constitutional:  Well developed, well nourished.  Awake & Oriented x3.  Head:  Atraumatic.   Normocephalic.    Eyes:  PERRL.  EOMI.  Conjunctivae are not pale.  ENT:  Mucous membranes are moist and intact.  Oropharynx is clear and symmetric.  Patent airway.  Neck:  Supple.  Full ROM.    Pulmonary/Chest:  No evidence of respiratory distress.    Back:  FROM. Nontender.  Extremities:  No edema.   No cyanosis.  No clubbing.  Full range of motion in all extremities.    Right knee - No bony pain. No swelling, bruising, or ecchymosis.   Right lower leg - proximal tenderness and right lateral ankle tenderness. No bruising.   Left lower leg - FROM, left knee. No bruising or swelling noted. No bony pain. No calf tenderness, swelling or bruising. Left lateral ankle, mild swelling and tenderness. Brisk cap refill and pedal pulse 2+.  Skin:  Skin is warm and dry.  No diaphoresis. No rash.   Neurological:  Alert, awake, and appropriate.  Normal speech.  Sensation normal. Motor normal.  Psychiatric:  Good eye contact.  Normal interaction, affect, and behavior      MDM and ED Course     ED Medication Orders      Start     Status Ordering Provider    11/10/11 1630   ibuprofen (ADVIL,MOTRIN) tablet 600 mg   Once      Route: Oral  Ordered Dose: 600 mg         Ordered Kayanna Mckillop SIMONE                 MDM  LABORATORY RESULTS: Ordered and independently interpreted available laboratory tests.  Results for orders placed during the hospital encounter of 05/25/11   CBC AND DIFFERENTIAL       Component Value Range    WBC 8.24  3.50 - 10.80 x10 3/uL    RBC 4.57  4.20 - 5.40 x10 6/uL    Hgb 12.3  12.0 - 16.0 g/dL    Hematocrit 62.9 (*) 37.0 - 47.0 %    MCV 77.7 (*) 80.0 - 100.0 fL    MCH 26.9 (*) 28.0 - 32.0 pg    MCHC 34.6  32.0 - 36.0 g/dL    RDW 15  12 - 15 %    Platelets 391  140 - 400 x10 3/uL    MPV 10.0  9.4 - 12.3 fL    Neutrophils 65  52 - 75 %    Lymphocytes Automated 29  15 - 41 %    Monocytes 5  0 - 11 %    Eosinophils Automated 1  0 - 5 %    Basophils Automated 0  0 - 2 %    Immature Granulocyte 0  0 - 1 %    Nucleated  RBC 0      Neutrophils Absolute 5.32  1.80 - 8.10 x10 3/uL    Abs Lymph Automated 2.37  0.50 - 4.40 x10 3/uL    Abs Mono Automated 0.43  0.00 - 1.20 x10 3/uL    Abs Eos Automated 0.10  0.00 - 0.70 x10 3/uL    Absolute Baso Automated 0.02  0.00 - 0.20 x10 3/uL    Absolute Immature Granulocyte 0.02  0 x10 3/uL   COMPREHENSIVE METABOLIC PANEL       Component Value Range    Glucose 321 (*) 70 - 100 mg/dL    BUN 52.8  7.0 - 41.3 mg/dL    Creatinine 0.8  0.6 - 1.0 mg/dL    Sodium 244 (*) 010 - 145 mEq/L    Potassium 4.2  3.5 - 5.1 mEq/L    Chloride 102  98 - 107 mEq/L    CO2 22  22 - 29 mEq/L    Calcium 9.8  8.5 - 10.5 mg/dL    Protein, Total 7.2  6.0 - 8.3 g/dL    Albumin 3.3 (*) 3.5 - 5.0 g/dL    AST (SGOT) 12  5 - 34 U/L    ALT 11  0 - 55 U/L    Alkaline Phosphatase 96  40 - 150 U/L    Bilirubin, Total 0.3  0.2 - 1.2 mg/dL    Globulin 3.9 (*) 2.0 - 3.6 g/dL    Albumin/Globulin Ratio 0.8 (*) 0.9 - 2.2    Anion Gap 9.0  5.0 - 15.0   HEMOLYZED INDEX       Component Value Range    Hemolyzed  Index 1  0 - 18 Index   GFR       Component Value Range    EGFR >60.0     TROPONIN I       Component Value Range    Troponin I <0.01  0.00 - 0.09 ng/mL   ECG 12-LEAD       Component Value Range    Ventricular Rate 89      Atrial Rate 89      P-R Interval 142      QRS Duration 94      Q-T Interval 366      QTC Calculation (Bezet) 445      P Axis 56      R Axis 12      T Axis 38         IMAGING STUDIES: Imaging studies were ordered.   XR ANKLE LEFT AP LATERAL AND OBLIQUE    Final Result:      1. No acute bone abnormality involving the left ankle.   XR ANKLE RIGHT AP LATERAL AND OBLIQUE    Final Result:      1. No acute bony abnormality involving the right ankle.   XR TIBIA FIBULA RIGHT AP AND LATERAL    Final Result:      1. No acute bony abnormality involving the right tibia or fibula.       MEDICAL DECISION MAKING:    Xray to rule-out fractures.  1540 pt resting comfortably.    PROCEDURE: SPLINT APPLICATION    Ace wrap and cast shoe  applied by tech, supervised by emergency provider.   Location: R ankle  Procedure: The area of the splinting was appropriately positioned.   Post-procedure: Good position.  Neurovascular status remains intact.  Patient tolerated the procedure well with no immediate complications      Vital Signs: Reviewed the patient's vital signs.   Nursing Notes: Reviewed and utilized available nursing notes.  Medical Records Reviewed: Reviewed available past medical records.    Counseling: The emergency provider has spoken with the patient and discussed today's findings, in addition to providing specific details for the plan of care. Questions are answered and there is agreement with the plan.     Scribe Attestation: Barrister's clerk by Corinne Ports, acting as scribe for, and in the presence of Joaquin Bend, NP .     Mid-level Provider Attestation: I, Joaquin Bend, NP, have been the primary provider for Allegra Grana during this Emergency Dept visit and have reviewed the chart documented by the scribe for accuracy and agree with its content.       Procedures    Clinical Impression & Disposition     Clinical Impression  Final diagnoses:   Ankle sprain        ED Disposition     Discharge Dorothy [name] discharge to home/self care.    Condition at discharge: Stable             New Prescriptions    IBUPROFEN (ADVIL,MOTRIN) 600 MG TABLET    Take 1 tablet (600 mg total) by mouth every 6 (six) hours as needed for Pain or Fever.        Treatment Team: Scribe: Kym Groom, NP  11/10/11 1552    Duane Lope, NP  11/10/11 1640

## 2011-11-14 ENCOUNTER — Emergency Department
Admission: EM | Admit: 2011-11-14 | Discharge: 2011-11-15 | Disposition: A | Discharge status: Home / Self Care | Payer: Medicare Other | Attending: Emergency Medicine | Admitting: Emergency Medicine

## 2011-11-14 ENCOUNTER — Emergency Department: Payer: Medicare Other

## 2011-11-14 DIAGNOSIS — J45909 Unspecified asthma, uncomplicated: Secondary | ICD-10-CM | POA: Insufficient documentation

## 2011-11-14 DIAGNOSIS — E119 Type 2 diabetes mellitus without complications: Secondary | ICD-10-CM | POA: Insufficient documentation

## 2011-11-14 DIAGNOSIS — S93409A Sprain of unspecified ligament of unspecified ankle, initial encounter: Secondary | ICD-10-CM | POA: Insufficient documentation

## 2011-11-15 NOTE — ED Notes (Signed)
MD at bedside. 

## 2011-11-15 NOTE — ED Notes (Signed)
Pt. C/o rt. Foot pain and swelling. Complains pain is radiatiing proximally to back of knee.

## 2011-11-15 NOTE — Discharge Instructions (Signed)
1. Return immediately if worse in any way or the splint becomes wet or soiled.    2. Follow up with your primary medical doctor for recheck.    3. Do not wear the splint for more than 3-4 days without recheck by your primary care doctor or the emergency department.    Ankle Sprain    You have been diagnosed with an ankle sprain.    A sprain is a ligament injury, usually a tear or partial tear. Sprains can hurt as much as broken bones. Sprains can be classified by the degree of injury. A first-degree sprain is considered a minor tear. A second-degree sprain is a partial tear of the ligament. A third-degree sprain often involves a small fracture, or break, of the bone that the ligament is attached to.    Sprains are usually treated with pain medication and a splint to keep the joint from moving. You should Rest, Ice, Compress, and Elevate the injured ankle. Remember this as "RICE."   REST: Limit the use of the injured body part.   ICE: By applying ice to the affected area, swelling and pain can be reduced. Place some ice cubes in a re-sealable (Ziploc) bag and add some water. Put a thin washcloth between the bag and the skin. Apply the ice bag to the area for at least 20 minutes. Do this at least 4 times per day. Using the ice for longer times and more frequently is OK. NEVER APPLY ICE DIRECTLY TO THE SKIN.   COMPRESS: Compression means to apply pressure around the injured area such as with a splint, cast or an ace bandage. Compression decreases swelling and improves comfort. Compression should be tight enough to relieve swelling but not so tight as to decrease circulation. Increasing pain, numbness, tingling, or change in skin color, are all signs of decreased circulation.   ELEVATE: Elevate the injured part. For example, a sprained ankle can be placed up on a chair while sitting and propped up on pillows while lying down.    You have been given a splint to help with pain and keep the ankle from moving. Use  the splint until you see an orthopedic (bone) doctor. Do the following many times each day:    Check capillary refill (circulation) in the nail beds by pressing on the toenail. It should turn white. When you let go, the nail should turn pink in less than 2 seconds.    Look for swelling outside of the splint.   If your foot or toes are very cold, pale or numb, the splint might be too tight. Loosen the wrap that holds the splint in place. You can also return here or go to the nearest Emergency Department to have the splint adjusted.   At this time, do not walk on your injured foot. Use crutches as directed.    YOU SHOULD SEEK MEDICAL ATTENTION IMMEDIATELY, EITHER HERE OR AT THE NEAREST EMERGENCY DEPARTMENT, IF ANY OF THE FOLLOWING OCCURS:   Your pain gets much worse.   Your ankle or foot starts to tingle or it becomes numb.   Your foot is cold or pale. This might mean there is a problem with circulation (blood supply).

## 2011-11-19 NOTE — ED Provider Notes (Signed)
Patient initially evaluated by Dr. Nonda Lou on 11/15/11 at 00:02.    History of Present Illness:  36 y.o. female presents approx. 1 week after trip and fall twisting her right ankle.  Patient states the pain has continued.  Patient was using a boot but states it is not helping.  No numbness, weakness, tingling.  Patient is ambulating but with pain.  Onset:1 week  Provoke:ambulation  Quality:ache  Radiation:up leg  Severity:moderate  Timing:constant    Past Medical History   Diagnosis Date   . Diabetes mellitus without complication    . Asthma without status asthmaticus      Past Surgical History   Procedure Date   . Cesarean section    . Cholecystectomy    . Tonsillectomy    . Hernia repair      History reviewed. No pertinent family history.  History     Social History   . Marital Status: Single     Spouse Name: N/A     Number of Children: N/A   . Years of Education: N/A     Occupational History   . Not on file.     Social History Main Topics   . Smoking status: Never Smoker    . Smokeless tobacco: Not on file   . Alcohol Use: No   . Drug Use: No   . Sexually Active: Not on file     Other Topics Concern   . Not on file     Social History Narrative   . No narrative on file       Review of Systems   Constitutional: Negative for fever and chills.   Respiratory: Negative for cough and shortness of breath.    Cardiovascular: Negative for chest pain.   Musculoskeletal: Negative for back pain.   Neurological: Negative for tingling and focal weakness.   Endo/Heme/Allergies: Does not bruise/bleed easily.       Physical Exam:  Nursing note and vitals reviewed.  Constitutional:  Well developed, well nourished.  Awake & alert.    Head:  Atraumatic.  Normocephalic.  .  Neck:  Supple.  No JVD. No c-spine ttp or bony deformity  Pulmonary/Chest:  No evidence of respiratory distress.      Back:  No CVA tenderness. No TL spine ttp or bony deformity  Extremities:  2+ DP/PT/radial pulses b/l extremities.  Compartment soft b/l UE and LE.   Brisk cap refill b/l upper and lower extremities.  B/l UE and LE normal except there is mild diffuse ttp to the right ankle without swelling or deformity.  Full ROM all joints.  Skin:  Skin is warm and dry.  No diaphoresis.   Neurological:  Alert, awake, and appropriate.  Normal speech.  Normal motor and sensory b/l UE and LE.  Psychiatric:  Good eye contact.  Normal interaction, affect, and behavior     Medical Decision Making:  I reviewed xrays from last visit as well as radiology read which was negative.  I d/w patient risk benefit of splint and patient wishes splint and consent for placed.  Short leg posterior splint placed RLE and NVI after placement.  Patient instructed in splint care.  Patient instructed she needs to f/u with PCP for recheck and eval for MRI r/o ligamentous injury or occult fracture and patient should not wear splint for more than 3 days without recheck and if unable to f/u with PCP return to ED for recheck.  Patient understands and agrees with plan.  Splint Application  Performed by: Bethann Berkshire  Authorized by: Bethann Berkshire  Consent: Verbal consent obtained.  Risks and benefits: risks, benefits and alternatives were discussed  Consent given by: patient  Patient understanding: patient states understanding of the procedure being performed  Patient identity confirmed: verbally with patient and arm band  Location details: right ankle  Splint type: short leg  Supplies used: Ortho-Glass  Post-procedure: The splinted body part was neurovascularly unchanged following the procedure.  Patient tolerance: Patient tolerated the procedure well with no immediate complications.        Diagnosis:  1. Ankle sprain      Condition:  Stable    Disposition: Susa Griffins, MD  11/19/11 2322

## 2011-12-02 ENCOUNTER — Emergency Department: Payer: Medicare Other

## 2011-12-02 ENCOUNTER — Emergency Department
Admission: EM | Admit: 2011-12-02 | Discharge: 2011-12-02 | Disposition: A | Discharge status: Home / Self Care | Payer: Medicare Other | Attending: Emergency Medicine | Admitting: Emergency Medicine

## 2011-12-02 DIAGNOSIS — S93409A Sprain of unspecified ligament of unspecified ankle, initial encounter: Secondary | ICD-10-CM | POA: Insufficient documentation

## 2011-12-02 DIAGNOSIS — X58XXXA Exposure to other specified factors, initial encounter: Secondary | ICD-10-CM | POA: Insufficient documentation

## 2011-12-02 DIAGNOSIS — J45909 Unspecified asthma, uncomplicated: Secondary | ICD-10-CM | POA: Insufficient documentation

## 2011-12-02 DIAGNOSIS — E119 Type 2 diabetes mellitus without complications: Secondary | ICD-10-CM | POA: Insufficient documentation

## 2011-12-02 MED ORDER — IBUPROFEN 600 MG PO TABS
800.00 mg | ORAL_TABLET | Freq: Once | ORAL | Status: AC
Start: 2011-12-02 — End: 2011-12-02
  Administered 2011-12-02: 800 mg via ORAL
  Filled 2011-12-02 (×2): qty 1

## 2011-12-02 NOTE — ED Notes (Signed)
Pt states her ankle cast broke so she took the cast off. Reports ankle pain

## 2011-12-02 NOTE — Discharge Instructions (Signed)
Study results to go  Activity as tolerated  Splint/crutches  Advil/Motrin/Ibuprofen - 600 mg every eight (8) hours for pain  Tylenol - 500 mg every four (4) hours for pain  Fluids-keep hydrated  Return if you are not improving or are worse  Follow-up with your ortho doctor

## 2011-12-06 NOTE — ED Provider Notes (Signed)
None        History     Chief Complaint   Patient presents with   . Foot Pain     HPI  This pt has been seen here twice for a right ankle sprain; she has since seen an ortho doctor who told her that her ankle was broken and put her in a cast; the cast has broken, so the pt is here again; the pt has pain with weight-bearing; the pt has no other symptoms  Past Medical History   Diagnosis Date   . Diabetes mellitus without complication    . Asthma without status asthmaticus        Past Surgical History   Procedure Date   . Cesarean section    . Cholecystectomy    . Tonsillectomy    . Hernia repair        No family history on file.    Social  History   Substance Use Topics   . Smoking status: Never Smoker    . Smokeless tobacco: Not on file   . Alcohol Use: No       .     Allergies   Allergen Reactions   . Percocet (Oxycodone-Acetaminophen)        Current/Home Medications    METFORMIN (GLUCOPHAGE) 1000 MG TABLET    Take 1,000 mg by mouth 2 (two) times daily with meals.        Review of Systems   Neurological: Negative for numbness.       Physical Exam    BP 174/86  Pulse 68  Temp(Src) 98.5 F (36.9 C) (Oral)  Resp 18  Ht 1.803 m  Wt 98.884 kg  BMI 30.42 kg/m2  SpO2 97%  LMP 11/25/2011    Physical Exam   Musculoskeletal:        The pt is in NAD with a normal appearing right ankle that is slightly tender over the lateral malleolus with an otherwise normal right ankle and foot exam; I reviewed the x-rays and could not detect any new or recent fractures (nor could a radiologist)       MDM and ED Course     ED Medication Orders      Start     Status Ordering Provider    12/02/11 1900   ibuprofen (ADVIL,MOTRIN) tablet 800 mg   Once      Route: Oral  Ordered Dose: 800 mg         Last MAR action:  Given Carmello Cabiness, Jacelyn Pi                 MDM  Number of Diagnoses or Management Options  Sprain of ankle:      Amount and/or Complexity of Data Reviewed  Independent visualization of images, tracings, or specimens:  yes    Risk of Complications, Morbidity, and/or Mortality  Presenting problems: minimal  Diagnostic procedures: minimal  Management options: minimal          Procedures    Clinical Impression & Disposition     Clinical Impression  Final diagnoses:   Sprain of ankle        ED Disposition     Discharge Allegra Grana discharge to home/self care.    Condition at discharge: Stable             New Prescriptions    No medications on file               Alleen Borne  G, MD  12/06/11 0120

## 2011-12-09 ENCOUNTER — Emergency Department: Payer: Medicare Other

## 2011-12-09 ENCOUNTER — Emergency Department
Admission: EM | Admit: 2011-12-09 | Discharge: 2011-12-09 | Disposition: A | Discharge status: Home / Self Care | Payer: Medicare Other | Attending: Emergency Medicine | Admitting: Emergency Medicine

## 2011-12-09 DIAGNOSIS — J45909 Unspecified asthma, uncomplicated: Secondary | ICD-10-CM | POA: Insufficient documentation

## 2011-12-09 DIAGNOSIS — M25579 Pain in unspecified ankle and joints of unspecified foot: Secondary | ICD-10-CM | POA: Insufficient documentation

## 2011-12-09 DIAGNOSIS — W010XXA Fall on same level from slipping, tripping and stumbling without subsequent striking against object, initial encounter: Secondary | ICD-10-CM | POA: Insufficient documentation

## 2011-12-09 DIAGNOSIS — E119 Type 2 diabetes mellitus without complications: Secondary | ICD-10-CM | POA: Insufficient documentation

## 2011-12-09 DIAGNOSIS — X500XXA Overexertion from strenuous movement or load, initial encounter: Secondary | ICD-10-CM | POA: Insufficient documentation

## 2011-12-09 DIAGNOSIS — S93409A Sprain of unspecified ligament of unspecified ankle, initial encounter: Secondary | ICD-10-CM

## 2011-12-09 MED ORDER — ONDANSETRON 4 MG PO TBDP
4.00 mg | ORAL_TABLET | Freq: Once | ORAL | Status: AC
Start: 2011-12-09 — End: 2011-12-09
  Administered 2011-12-09: 4 mg via ORAL
  Filled 2011-12-09: qty 1

## 2011-12-09 MED ORDER — HYDROMORPHONE HCL PF 1 MG/ML IJ SOLN
2.00 mg | Freq: Once | INTRAMUSCULAR | Status: AC
Start: 2011-12-09 — End: 2011-12-09
  Administered 2011-12-09: 2 mg via INTRAMUSCULAR
  Filled 2011-12-09: qty 2

## 2011-12-09 NOTE — ED Provider Notes (Signed)
EMERGENCY DEPARTMENT HISTORY AND PHYSICAL EXAM    Date Time: 12/09/2011 9:07 PM  Patient Name: Dorothy,FIEPPIRJ [name]  Attending Physician: No att. providers found, Dr. Carles Collet  Mid-Level: Neomia Dear    History of Presenting Illness:   Dorothy [name] is a 36 [name] female   Chief Complaint: BIBA c/o persistent right ankle pain. This is 4th visit to ER since 11/10/2011. First visit to [facility] 11/10/2011 after she tripped and fell. Neg xrays and was discharge home. Return to [facility] 11/14/2011 and was given a short leg ortho glass splint. Returned to [facility] again 12/02/2011 at the time stating that she had seen ortho (seeing Dr. Maple Hudson at [facility] per pt) who told her she had +ankle fracture and was given cast. States the bottom of the cast fell off. She states she was only given an ACE wrap at this visit and discharged home. Pt presents to [facility] today c/o right ankle pain. Pt is weightbearing but with a lot of pain. No numbness/ tingling. Denies any other injuries or complaints. Pt left her crutches at home.  History obtained from: Patient.  Onset/Duration: 11/10/2011  Quality: aching  Severity:severe  Aggravating Factors: walking  Alleviating Factors: pain medications  Associated Symptoms: none   Narrative/Additional Historical Findings: Dorothy [name]  PCP: Mariana Single, MD    Past Medical History:     Past Medical History   Diagnosis Date   . Diabetes mellitus without complication    . Asthma without status asthmaticus        Past Surgical History:     Past Surgical History   Procedure Date   . Cesarean section    . Cholecystectomy    . Tonsillectomy    . Hernia repair        Family History:   No family history on file.    Social History:     History     Social History   . Marital Status: Single     Spouse Name: Dorothy [name]     Number of Children: Dorothy [name]   . Years of Education: Dorothy [name]     Social History Main Topics   . Smoking status: Never Smoker    . Smokeless tobacco: Not on file   . Alcohol Use: No   . Drug Use:  No   . Sexually Active: Not on file     Other Topics Concern   . Not on file     Social History Narrative   . No narrative on file       Allergies:     Allergies   Allergen Reactions   . Percocet (Oxycodone-Acetaminophen)        Medications:     Patient's Medications   New Prescriptions    No medications on file   Previous Medications    IBUPROFEN (ADVIL,MOTRIN) 600 MG TABLET    Take 600 mg by mouth every 6 (six) hours as needed.    METFORMIN (GLUCOPHAGE) 1000 MG TABLET    Take 1,000 mg by mouth 2 (two) times daily with meals.   Modified Medications    No medications on file   Discontinued Medications    No medications on file       Review of Systems:     Constitutional: No fever or chills.  Musculoskeletal: No neck or back pain. RIght ankle pain/swelling.  Skin:No rash or skin lesions.  Neurologic: No numbness/tingling.      Physical Exam:     Filed Vitals:  12/09/11 1637   BP: 119/66   Pulse: 77   Temp: 97.8 F (36.6 C)   Resp: 20   SpO2: 100%       Constitutional: Vital signs reviewed. Obese female.  Head: Normocephalic, atraumatic  Eyes: No conjunctival injection. No discharge.  ENT: Mucous membranes moist  Respiratory/Chest:  No respiratory distress.   UpperExtremity: Grossly normal ROM.   LowerExtremity: Right lateral ankle tenderness. Mild swelling. Dorsiflexion/plantarflexion intact. No 5th metatarsal tenderness. NO splint/walking boot in place.  Neurological: No focal motor deficits by observation. Speech normal. Memory normal.  Skin: Warm and dry. No rash.  Psychiatric: Normal affect. Normal concentration.      Rads:     Radiology Results (24 Hour)     Procedure Component Value Units Date/Time    XR Ankle Right 3+ Views [[id number]] Collected:12/09/11 1755    Order Status:Completed  Updated:12/09/11 1806    Narrative:    HISTORY: Pain. Injury.     TECHNIQUE: Four views of the right  ankle were obtained.      PRIORS: None.     FINDINGS:   The soft tissues are unremarkable. The joint spaces are  maintained.  There are no evidence of acute fractures. The ankle mortise is  maintained. There is plantar calcaneal spur.       Impression:     No acute findings.            Assessment/Plan:   Ankle sprain.  Will re-splint and have pt f/u with her ORtho. We contacted [facility] and i s/w Dr. Tanda Rockers who states there is no Dr. Maple Hudson in group. Pt was offered crutches but she declined.      Signed by: Neomia Dear, PA-C                    Zielinska-Marszalkowski, [place], [place]  12/09/11 2117

## 2011-12-09 NOTE — ED Notes (Signed)
LKG:MW10<UV> Expected date:12/09/11<BR> Expected time: 4:17 PM<BR> Means of arrival:[id number]- [place]<BR> Comments:<BR>

## 2011-12-09 NOTE — ED Notes (Signed)
Pt fractured her Right ankle last week. Was seen by Orthopedic MD and had ankle placed in a cast and walking boot which "broke" on Monday, then went back to [facility] ED and had splint replaced. Here d/t increased pain to Right ankle.

## 2011-12-09 NOTE — Discharge Instructions (Signed)
Ankle Sprain    You have been diagnosed with an ankle sprain.    A sprain is a ligament injury, usually a tear or partial tear. Sprains can hurt as much as broken bones. Sprains can be classified by the degree of injury. A first-degree sprain is considered a minor tear. A second-degree sprain is a partial tear of the ligament. A third-degree sprain often involves a small fracture, or break, of the bone that the ligament is attached to.    Sprains are usually treated with pain medication and a splint to keep the joint from moving. You should Rest, Ice, Compress, and Elevate the injured ankle. Remember this as "RICE."   REST: Limit the use of the injured body part.   ICE: By applying ice to the affected area, swelling and pain can be reduced. Place some ice cubes in a re-sealable (Ziploc) bag and add some water. Put a thin washcloth between the bag and the skin. Apply the ice bag to the area for at least 20 minutes. Do this at least 4 times per day. Using the ice for longer times and more frequently is OK. NEVER APPLY ICE DIRECTLY TO THE SKIN.   COMPRESS: Compression means to apply pressure around the injured area such as with a splint, cast or an ace bandage. Compression decreases swelling and improves comfort. Compression should be tight enough to relieve swelling but not so tight as to decrease circulation. Increasing pain, numbness, tingling, or change in skin color, are all signs of decreased circulation.   ELEVATE: Elevate the injured part. For example, a sprained ankle can be placed up on a chair while sitting and propped up on pillows while lying down.    You have been given a splint to help with pain and keep the ankle from moving. Use the splint until you see an orthopedic (bone) doctor. Do the following many times each day:    Check capillary refill (circulation) in the nail beds by pressing on the toenail. It should turn white. When you let go, the nail should turn pink in less than 2 seconds.     Look for swelling outside of the splint.   If your foot or toes are very cold, pale or numb, the splint might be too tight. Loosen the wrap that holds the splint in place. You can also return here or go to the nearest Emergency Department to have the splint adjusted.   At this time, do not walk on your injured foot. Use crutches as directed.    YOU SHOULD SEEK MEDICAL ATTENTION IMMEDIATELY, EITHER HERE OR AT THE NEAREST EMERGENCY DEPARTMENT, IF ANY OF THE FOLLOWING OCCURS:   Your pain gets much worse.   Your ankle or foot starts to tingle or it becomes numb.   Your foot is cold or pale. This might mean there is a problem with circulation (blood supply).    Orthopedics  (Note: there are other office locations)  [facility]  [place] office  [address]  [place], [place] [address]  [phone]    [facility] office  [address]  [place], [place], [address]  [phone]    [facility] office  [address]  [place], [place] [address]  [phone]    [facility]  [place] office  [address]  [place], [place] [address]  [phone]    [facility] office  [address]  [place], [place] [address]  [phone]  [facility]  [place] office  [address]  [place], [place] [address]  [phone]    [facility] office  [address]  [place], [place] [address]  [phone]    Dr. Queen Slough (solo practice)  [facility] office  [address]  [place], [place] [address]  [phone]

## 2011-12-10 NOTE — ED Provider Notes (Signed)
I have reviewed and agree with history. The pertinent physical exam has been documented. The patient was seen and examined by PA or NP; plan of care was discussed with me.      Annett Fabian, MD  12/10/11 Marlyne Beards

## 2011-12-27 ENCOUNTER — Emergency Department: Payer: Medicare Other

## 2011-12-27 ENCOUNTER — Emergency Department
Admission: EM | Admit: 2011-12-27 | Discharge: 2011-12-27 | Disposition: A | Discharge status: Home / Self Care | Payer: Medicare Other | Attending: Emergency Medicine | Admitting: Emergency Medicine

## 2011-12-27 DIAGNOSIS — J45909 Unspecified asthma, uncomplicated: Secondary | ICD-10-CM | POA: Insufficient documentation

## 2011-12-27 DIAGNOSIS — E119 Type 2 diabetes mellitus without complications: Secondary | ICD-10-CM | POA: Insufficient documentation

## 2011-12-27 DIAGNOSIS — R109 Unspecified abdominal pain: Secondary | ICD-10-CM

## 2011-12-27 DIAGNOSIS — K439 Ventral hernia without obstruction or gangrene: Secondary | ICD-10-CM

## 2011-12-27 DIAGNOSIS — Z79899 Other long term (current) drug therapy: Secondary | ICD-10-CM | POA: Insufficient documentation

## 2011-12-27 LAB — CBC
Hematocrit: 33.1 % — ABNORMAL LOW (ref 37.0–47.0)
Hgb: 11.4 g/dL — ABNORMAL LOW (ref 12.0–16.0)
MCH: 26.6 pg — ABNORMAL LOW (ref 28.0–32.0)
MCHC: 34.4 g/dL (ref 32.0–36.0)
MCV: 77.3 fL — ABNORMAL LOW (ref 80.0–100.0)
MPV: 9.4 fL (ref 9.4–12.3)
Nucleated RBC: 0 /100 WBC (ref 0–1)
Platelets: 365 10*3/uL (ref 140–400)
RBC: 4.28 10*6/uL (ref 4.20–5.40)
RDW: 15 % (ref 12–15)
WBC: 9.44 10*3/uL (ref 3.50–10.80)

## 2011-12-27 LAB — COMPREHENSIVE METABOLIC PANEL
ALT: 9 U/L (ref 0–55)
AST (SGOT): 12 U/L (ref 5–34)
Albumin/Globulin Ratio: 0.9 (ref 0.9–2.2)
Albumin: 3.5 g/dL (ref 3.5–5.0)
Alkaline Phosphatase: 87 U/L (ref 40–150)
BUN: 15 mg/dL (ref 7–21)
Bilirubin, Total: 0.2 mg/dL (ref 0.2–1.2)
CO2: 22 mEq/L (ref 22–29)
Calcium: 9.6 mg/dL (ref 8.5–10.5)
Chloride: 105 mEq/L (ref 98–107)
Creatinine: 0.8 mg/dL (ref 0.6–1.0)
Globulin: 4.1 g/dL — ABNORMAL HIGH (ref 2.0–3.6)
Glucose: 178 mg/dL — ABNORMAL HIGH (ref 70–100)
Potassium: 3.9 mEq/L (ref 3.5–5.1)
Protein, Total: 7.6 g/dL (ref 6.0–8.3)
Sodium: 138 mEq/L (ref 136–145)

## 2011-12-27 LAB — LIPASE: Lipase: 6 U/L — ABNORMAL LOW (ref 8–78)

## 2011-12-27 LAB — HCG, SERUM, QUALITATIVE: Hcg Qualitative: NEGATIVE

## 2011-12-27 LAB — GFR: EGFR: >60

## 2011-12-27 MED ORDER — IOHEXOL 240 MG/ML IJ SOLN
INTRAMUSCULAR | Status: DC
Start: 2011-12-27 — End: 2011-12-27
  Filled 2011-12-27: qty 50

## 2011-12-27 MED ORDER — KETOROLAC TROMETHAMINE 30 MG/ML IJ SOLN
30.00 mg | Freq: Four times a day (QID) | INTRAMUSCULAR | Status: AC
Start: 2011-12-27 — End: 2011-12-27
  Administered 2011-12-27: 30 mg via INTRAVENOUS
  Filled 2011-12-27: qty 1

## 2011-12-27 MED ORDER — IOHEXOL 240 MG/ML IJ SOLN
50.00 mL | Freq: Once | INTRAMUSCULAR | Status: AC | PRN
Start: 2011-12-27 — End: 2011-12-27
  Administered 2011-12-27: 50 mL via ORAL

## 2011-12-27 NOTE — Discharge Instructions (Signed)
Abdominal Pain    You have been diagnosed with abdominal (belly) pain. The cause of your pain is not yet known.    Many things can cause abdominal pain. Examples include viral infections and bowel (intestine) spasms. You might need another examination or more tests to find out why you have pain.    At this time, your pain does not seem to be caused by anything dangerous. You do not need surgery. You do not need to stay in the hospital.     Though we don't believe your condition is dangerous right now, it is important to be careful. Sometimes a problem that seems mild can become serious later. This is why it is very important that you return here or go to the nearest Emergency Department unless you are 100% improved.    Return here or go to the nearest Emergency Department, or follow up with your physician in:   24 hours.    Drink only clear liquids such as water, clear broth, sports drinks, or clear caffeine-free soft drinks, like 7-Up or Sprite, for the next:   24 hours.    YOU SHOULD SEEK MEDICAL ATTENTION IMMEDIATELY, EITHER HERE OR AT THE NEAREST EMERGENCY DEPARTMENT, IF ANY OF THE FOLLOWING OCCURS:   Your pain does not go away or gets worse.   You cannot keep fluids down or your vomit is dark green.    You vomit blood or see blood in your stool. Blood might be bright red or dark red. It can also be black and look like tar.   You have a fever or shaking chills.   Your skin or eyes look yellow or your urine looks brown.   You have severe diarrhea.

## 2011-12-27 NOTE — ED Provider Notes (Signed)
Physician/Midlevel provider first contact with patient: 12/27/11 0135         History   No chief complaint on file.    HPI    36 y/o female presents to ED c/o LLQ abdominal pain since this evening. Pt states having abdominal pain with nausea. Pt reports having Hx of Hernia and C-Section. Pt can not take Percocet.    Past Medical History   Diagnosis Date   . Diabetes mellitus without complication    . Asthma without status asthmaticus        Past Surgical History   Procedure Date   . Cesarean section    . Cholecystectomy    . Tonsillectomy    . Hernia repair        History reviewed. No pertinent family history.    Social  History   Substance Use Topics   . Smoking status: Never Smoker    . Smokeless tobacco: Not on file   . Alcohol Use: No       .     Allergies   Allergen Reactions   . Percocet (Oxycodone-Acetaminophen)        Current/Home Medications    IBUPROFEN (ADVIL,MOTRIN) 600 MG TABLET    Take 600 mg by mouth every 6 (six) hours as needed.    METFORMIN (GLUCOPHAGE) 1000 MG TABLET    Take 1,000 mg by mouth 2 (two) times daily with meals.        Review of Systems   Gastrointestinal: Positive for nausea and abdominal pain (LLQ).       Physical Exam    BP 118/68  Pulse 88  Temp(Src) 98.7 F (37.1 C) (Oral)  Resp 16  Ht 1.803 m  Wt 151 kg  BMI 46.43 kg/m2  SpO2 98%  LMP 09/12/2011    Physical Exam    Nursing note and vitals reviewed.  Constitutional:  Well developed, well nourished. Awake & Oriented x3.  Head:  Atraumatic. Normocephalic.    Eyes:  PERRL. EOMI. Conjunctivae are not pale.  ENT:  Mucous membranes are moist and intact. Oropharynx is clear and symmetric.  Patent airway.  Neck:  Supple. Full ROM.    Cardiovascular:  Regular rate. Regular rhythm. No murmurs, rubs, or gallops.  Pulmonary/Chest:  No evidence of respiratory distress. Clear to auscultation bilaterally.  No wheezing, rales or rhonchi.   Abdominal:  Soft and non-distended. Tenderness in LLQ. No rebound, guarding, or rigidity.  Back:   Full ROM. Nontender.  Extremities:  No edema. No cyanosis. No clubbing. Full range of motion in all extremities.  Skin:  Skin is warm and dry.  No diaphoresis. No rash.   Neurological:  Alert, awake, and appropriate. Normal speech. Motor normal.  Psychiatric:  Good eye contact. Normal interaction, affect, and behavior.        MDM and ED Course     ED Medication Orders      Start     Status Ordering Provider    12/27/11 0215   ketorolac (TORADOL) injection 30 mg   4 times per day      Route: Intravenous  Ordered Dose: 30 mg         Last MAR action:  Given Gabriela Giannelli H                 MDM    LABORATORY RESULTS: Ordered and independently interpreted available laboratory tests.  Labs Reviewed   CBC - Abnormal; Notable for the following:     Hgb  11.4 (*)      Hematocrit 33.1 (*)      MCV 77.3 (*)      MCH 26.6 (*)      All other components within normal limits   COMPREHENSIVE METABOLIC PANEL - Abnormal; Notable for the following:     Glucose 178 (*)      Globulin 4.1 (*)      All other components within normal limits   LIPASE - Abnormal; Notable for the following:     Lipase 6 (*)      All other components within normal limits   GFR   HCG, SERUM, QUALITATIVE   URINALYSIS, REFLEX TO MICROSCOPIC EXAM IF INDICATED       CT ABDOMEN PELVIS WO IV/ W PO CONT    (Results Pending)       MEDICAL DECISION MAKING:    Oxygen Saturation by Pulse Oximetry:  98%  Interventions: None Needed.  Interpretation: Normal    Vital Signs: Reviewed the patient?s vital signs.     Nursing Notes: Reviewed and utilized available nursing notes.    Medical Records Reviewed: Reviewed available past medical records.         Reassessment: 0530, pt feeling much better, will follow up. Normal appendix. 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    Counseling: The emergency provider has spoken with the patient and discussed today?s findings, in addition to providing specific  details for the plan of care.  Questions are answered and there is agreement with the plan.      Scribe Attestation: Barrister's clerk by Leonia Reader, acting as scribe for, and in the presence of Dr. Lemmie Evens, MD.    Physician Attestation: I, Dr. Lemmie Evens, MD, have been the primary provider for Dorothy [name] during this Emergency Dept visit. I, Dr. Lemmie Evens, MD, have reviewed the chart documented by the scribe for accuracy and agree with its content.         Procedures    Clinical Impression & Disposition     Clinical Impression  Final diagnoses:   Abdominal pain, acute   Hernia, ventral        ED Disposition     Discharge Dorothy [name] discharge to home/self care.    Condition at discharge: Good             New Prescriptions    No medications on file                 Rosalita Levan, MD  12/27/11 [phone]

## 2011-12-27 NOTE — ED Notes (Signed)
Left lower abd pain

## 2012-02-26 ENCOUNTER — Emergency Department: Payer: Medicare Other

## 2012-02-26 ENCOUNTER — Emergency Department
Admission: EM | Admit: 2012-02-26 | Discharge: 2012-02-26 | Disposition: A | Discharge status: Home / Self Care | Payer: Medicare Other | Attending: Emergency Medicine | Admitting: Emergency Medicine

## 2012-02-26 DIAGNOSIS — M65849 Other synovitis and tenosynovitis, unspecified hand: Secondary | ICD-10-CM | POA: Insufficient documentation

## 2012-02-26 DIAGNOSIS — E119 Type 2 diabetes mellitus without complications: Secondary | ICD-10-CM | POA: Insufficient documentation

## 2012-02-26 DIAGNOSIS — J45909 Unspecified asthma, uncomplicated: Secondary | ICD-10-CM | POA: Insufficient documentation

## 2012-02-26 MED ORDER — NAPROXEN 250 MG PO TABS
500.00 mg | ORAL_TABLET | Freq: Two times a day (BID) | ORAL | Status: AC
Start: 2012-02-26 — End: 2012-03-04

## 2012-02-26 MED ORDER — IBUPROFEN 600 MG PO TABS
600.00 mg | ORAL_TABLET | Freq: Once | ORAL | Status: DC
Start: 2012-02-26 — End: 2012-02-26
  Filled 2012-02-26: qty 1

## 2012-02-26 NOTE — ED Notes (Addendum)
Pt presents c/o right hand pain s/p hearing a "pop" while reaching for an item. States pain is most severe in right 3rd and 4th digits.

## 2012-02-26 NOTE — Discharge Instructions (Signed)
You have tendonitis of your right wrist.  This is inflammation involving one or many of your tendons.  You are to take Naprosyn 500 mg twice daily with food for pain for the next week. Stop taking this if you develop a stomach ache.  You are to follow up with your primary care doctor in 1 week to recheck your wrist.  Wear an ace wrap for comfort.  Ice your wrist multiple times daily for 10 minutes each for comfort.  Avoid activities which exacerbate your pain.

## 2012-02-26 NOTE — ED Notes (Signed)
Pt requested to speak to Supervisor due to pt thinks Dr. Thad Ranger gave her the wrong diagnosis.
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
## 2012-02-26 NOTE — ED Notes (Signed)
Pt alert and oriented x3; vss; in nad; left with steady gait
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
## 2012-02-26 NOTE — ED Provider Notes (Signed)
Physician/Midlevel provider first contact with patient: 02/26/12 1800       EMERGENCY DEPARTMENT NOTE    Physician/Midlevel provider first contact with patient: 02/26/12 1800         HISTORY OF PRESENT ILLNESS   Historian:Patient  Translator Used: No    36 y.o. female presents to ED for evaluation of right wrist pain which occurred after she was reaching and twisting her hand to get something on her shelf today. Patient states she heard a "pop". Pain is worsened by extension of the ring and middle finger. Has not taken anything for the pain. Denies falls or trauma to the hand. Denies fever, swelling, or erythema.    1. Location of symptoms: right hand  2. Onset of symptoms: today  3. What was patient doing when symptoms started (Context): reaching and twisting her hand when getting something  4. Severity: moderate  5. Timing: rarely  6. Activities that worsen symptoms: extension of ring and middle finger  7. Activities that improve symptoms: immobilization  8. Quality: unchanged  9. Radiation of symptoms: none  10. Associated signs and Symptoms: none  11. Are symptoms worsening? yes  MEDICAL HISTORY     Past Medical History:  Past Medical History   Diagnosis Date   . Diabetes mellitus without complication    . Asthma without status asthmaticus        Past Surgical History:  Past Surgical History   Procedure Date   . Cesarean section    . Cholecystectomy    . Tonsillectomy    . Hernia repair        Social History:  History     Social History   . Marital Status: Single     Spouse Name: N/A     Number of Children: N/A   . Years of Education: N/A     Occupational History   . Not on file.     Social History Main Topics   . Smoking status: Never Smoker    . Smokeless tobacco: Not on file   . Alcohol Use: No   . Drug Use: No   . Sexually Active: Not on file     Other Topics Concern   . Not on file     Social History Narrative   . No narrative on file       Family History:  History reviewed. No pertinent family  history.    Outpatient Medication:  Previous Medications    IBUPROFEN (ADVIL,MOTRIN) 600 MG TABLET    Take 600 mg by mouth every 6 (six) hours as needed.    METFORMIN (GLUCOPHAGE) 1000 MG TABLET    Take 1,000 mg by mouth 2 (two) times daily with meals.         REVIEW OF SYSTEMS   Review of Systems   Constitutional: Negative for fever.   Musculoskeletal: Positive for joint pain (right hand). Negative for myalgias and falls.        Right hand   Endo/Heme/Allergies: Does not bruise/bleed easily.   All other systems reviewed and are negative.        PHYSICAL EXAM     Filed Vitals:    02/26/12 1744   BP: 164/78   Pulse: 88   Temp: 97.9 F (36.6 C)   Resp: 18   SpO2: 100%       Nursing note and vitals reviewed.    Constitutional: Well developed, well nourished. Obese.  Head: Atraumatic.    Eyes: PERRL. EOMI. No  scleral icterus.  ENT: Mucous membranes are moist and intact.  Neck: Supple.  Cardiovascular: Regular rate. Regular rhythm.   Pulmonary/Chest: No evidence of respiratory distress.  Extremities: Right hand/wrist: No deformity, swelling, erythema, or warmth. No bony tenderness. No snuff box tenderness. Pain to extension of right mid and ring finger. Tenderness with palpation of wrist flexor tendon. Sensation intake. Strength intact.  Skin: Warm and dry. No rash.   Neurological: Awake, alert and oriented x 3.   Psychiatric: Appropriate affect. Appropriate mood. Appropriate behavior.    MEDICAL DECISION MAKING   Patient felt popping sensation in right wrist while lifting for object today. No history of trauma. No deformity appreciated involving wrist. Flexor tendons tender to palpation. Suspect tendonitis. Discussed my findings with patient. Patient demanded an x-ray and insisted that she has a fracture. X-ray of wrist obtained which is normal. Discussed my findings with patient at bedside. Administered Ibuprofen for pain. Offered to wrap wrist in either ace wrap or velcro splint for comfort. This was provided. Ice  provided. Discussed plan to treat her pain. Patient very upset. From the beginning of visit, patient stated that she did not trust the judgment of any of the physicians at [facility]. Patient stated she would follow up with an orthopedic doctor. I welcomed patient to get a second opinion. Discharged her home. RN manager spoke with patient at patient's request.     Discharge Instructions:  You have tendonitis of your right wrist.  This is inflammation involving one or many of your tendons.  You are to take Naprosyn 500 mg twice daily with food for pain for the next week. Stop taking this if you develop a stomach ache.  You are to follow up with your primary care doctor in 1 week to recheck your wrist.  Wear an ace wrap for comfort.  Ice your wrist multiple times daily for 10 minutes each for comfort.  Avoid activities which exacerbate your pain.      DISCUSSION      Vital Signs: Reviewed the patient?s vital signs.   Nursing Notes: Reviewed and utilized available nursing notes.  Medical Records Reviewed: Reviewed available past medical records.  Counseling: The emergency provider has spoken with the patient and discussed today?s findings, in addition to providing specific details for the plan of care.  Questions are answered and there is agreement with the plan.        IMAGING STUDIES      PULSE OXIMETRY      EMERGENCY DEPT. MEDICATIONS      ED Medication Orders      Start     Status Ordering Provider    02/26/12 1830   ibuprofen (ADVIL,MOTRIN) tablet 600 mg   Once      Route: Oral  Ordered Dose: 600 mg         Acknowledged [name] WINDSOR                LABORATORY RESULTS    Ordered and independently interpreted AVAILABLE laboratory tests. Please see results section in chart for full details.  Results for orders placed during the hospital encounter of 12/27/11   CBC       Component Value Range    WBC 9.44  3.50 - 10.80 x10 3/uL    RBC 4.28  4.20 - 5.40 x10 6/uL    Hgb 11.4 (*) 12.0 - 16.0 g/dL     Hematocrit 36.6 (*) 37.0 - 47.0 %    MCV 77.3 (*)  80.0 - 100.0 fL    MCH 26.6 (*) 28.0 - 32.0 pg    MCHC 34.4  32.0 - 36.0 g/dL    RDW 15  12 - 15 %    Platelets 365  140 - 400 x10 3/uL    MPV 9.4  9.4 - 12.3 fL    Nucleated RBC 0  0 - 1 /100 WBC   COMPREHENSIVE METABOLIC PANEL       Component Value Range    Glucose 178 (*) 70 - 100 mg/dL    BUN 15  7 - 21 mg/dL    Creatinine 0.8  0.6 - 1.0 mg/dL    Sodium 161  096 - 045 mEq/L    Potassium 3.9  3.5 - 5.1 mEq/L    Chloride 105  98 - 107 mEq/L    CO2 22  22 - 29 mEq/L    Calcium 9.6  8.5 - 10.5 mg/dL    Protein, Total 7.6  6.0 - 8.3 g/dL    Albumin 3.5  3.5 - 5.0 g/dL    AST (SGOT) 12  5 - 34 U/L    ALT 9  0 - 55 U/L    Alkaline Phosphatase 87  40 - 150 U/L    Bilirubin, Total 0.2  0.2 - 1.2 mg/dL    Globulin 4.1 (*) 2.0 - 3.6 g/dL    Albumin/Globulin Ratio 0.9  0.9 - 2.2   LIPASE       Component Value Range    Lipase 6 (*) 8 - 78 U/L   GFR       Component Value Range    EGFR >60.0     HCG, SERUM, QUALITATIVE       Component Value Range    Hcg Qualitative Negative  Negative       ATTESTATIONS      Scribe Attestation: Written by Noel Gerold, acting as scribe for, and in the presence of [name], MD    Physician Attestation: I, [name], MD , have been the primary provider for Dorothy [name] during this Emergency Dept visit and have reviewed the chart documented by the scribe for accuracy and agree with its content.       DIAGNOSIS      Diagnosis:  Final diagnoses:   Tendonitis of wrist, right       Disposition:  ED Disposition     Discharge Dorothy [name] discharge to home/self care.    Condition at discharge: Good            Prescriptions:  Patient's Medications   New Prescriptions    NAPROXEN (NAPROSYN) 250 MG TABLET    Take 2 tablets (500 mg total) by mouth 2 (two) times daily with meals.    NAPROXEN (NAPROSYN) 250 MG TABLET    Take 2 tablets (500 mg total) by mouth 2 (two) times daily with meals.   Previous Medications    IBUPROFEN  (ADVIL,MOTRIN) 600 MG TABLET    Take 600 mg by mouth every 6 (six) hours as needed.    METFORMIN (GLUCOPHAGE) 1000 MG TABLET    Take 1,000 mg by mouth 2 (two) times daily with meals.   Modified Medications    No medications on file   Discontinued Medications    No medications on file               Marland Mcalpine, MD  02/26/12 [phone]

## 2012-04-16 NOTE — Op Note (Unsigned)
DATE OF SURGERY:                    11/19/2000            SURGEON:                            Lajoyce Corners, MD            ASSISTANT(S):                       Lonisha Bobby Troy Sine, MD                  PREOPERATIVE DIAGNOSES      1.   PREVIOUS CESAREAN SECTION TIMES TWO.      2.   INTRAUTERINE PREGNANCY AT TERM.      3.   STATUS POST BLUNT ABDOMINAL TRAUMA, NOW WITH CONTRACTIONS.      4.   SUSPECTED UTERINE RUPTURE IN EVOLUTION.            POSTOPERATIVE DIAGNOSES      1.   PREVIOUS CESAREAN SECTION TIMES TWO.      2.   INTRAUTERINE PREGNANCY AT TERM.      3.   STATUS POST BLUNT ABDOMINAL TRAUMA, NOW WITH CONTRACTIONS.            PROCEDURE:  REPEAT LOW TRANSVERSE CESAREAN  SECTION  VIA  PFANNENSTIEL SKIN      INCISION.            ANESTHESIA:  Spinal.            ESTIMATED BLOOD LOSS:  700 cc.            URINE OUTPUT:  150 cc clear at the end of the procedure.            INTRAVENOUS FLUIDS:  4,600 cc of lactated Ringer's solution.            FINDINGS:  We delivered a viable female  infant  weighing 8 pounds 4 ounces      with Apgar scores of 8 and 9 at one and  five  minutes,  respectively.  The      placenta appeared grossly within normal  limits.  The abdominopelvic cavity      was remarkable for dense fibrous adhesions  throughout.  There was no other      gross pathology of the abdomen and/or pelvis noted.            COMPLICATIONS:  None.            PATHOLOGY:  None.            DESCRIPTION OF PROCEDURE:  The patient was informed and consented regarding      the  risks  and  benefits  of  repeat  lower  transverse  cesarean  section      including the possibility of life-threatening  hemorrhage  requiring  blood      transfusion  and  possible cesarean section  hysterectomy,  injury  to  the      abdominal and pelvic organs and tissues  given the patient's morbid obesity      and  previous  surgical  history, and  infection.   The  patient  was  also      counseled about the possibility of  injuring  the  fetus  as  it   was being      delivered.   All  questions  were answered.   The  patient  agreed  to  the      proposed plan of care.            The patient was then taken to the operating  room  where  spinal anesthesia      was administered and noted to be adequate.   The patient was then placed on      the operating room table in the dorsal  supine  position  with  a  leftward      tilt.  She was prepared and draped  in  the  standard  sterile  manner.   A      horizontal skin incision was made approximately 2 cm around the preexisting      cesarean section scar.  The scar was excised  using an elliptical incision.      The incision was then carried down  to  the  underlying  fascia  using  the      scalpel.  The incision was then extended  laterally using a Chiropodist.  The inferior and superior  edges of the incision were then      reexamined and the rectus muscles were adhered to the overlying fascia with      dense fibrous scar tissue.  Next, the  bladder  blade  was inserted and the      bladder flap was created.  The bladder  blade was then reinserted and a low      transverse uterine incision was made  in  a  U  shape.   This  incision was      extended laterally and cephalad using  the  bandage scissors.  The baby was      delivered as described.  The infant  was  bulb  suctioned  and the cord was      clamped and cut.  A cord sample was obtained.  The baby was then handed off      to awaiting pediatricians.  The placenta  was  delivered  spontaneously and      intact with a three-vessel cord.  The  uterus was then exteriorized and the      angles  of  the  incision were grasped  with  Allis  clamps.   The  uterine      incision  was closed with 0 Monocryl  in  a  running  locked  stitch.   The      incision was noted to be completely  hemostatic  and  did  not  require any      extra stitches.  The uterus was then  reexamined  and there was no evidence      of  uterine  rupture  either   anteriorly   in   the  preexisting  scar  nor      posteriorly.  The fallopian tubes and ovaries appeared to be grossly within      normal limits.  The abdominopelvic cavity  was  then  irrigated  with  warm      saline.  The uterus was returned to  the  abdominopelvic  cavity,  and  the      incision was again irrigated with warm  saline.  The  field  was  entirely      hemostatic.  Next, the fascia was closed with 0 PDS in a running continuous      stitch.  Of note, the suture broke as  the  two  ends of the PDS were being      tied in the midline to close the fascial  incision.  The entire suture line      was then removed and double-stranded 0 PDS too was used to close the fascia      in full-thickness manner.  There were  no  defects  in  the  closure noted.      Next, the subcutaneous adipose tissue  was copiously irrigated and noted to      be completely hemostatic.  The subcutaneous adipose was then reapproximated      using three interrupted sutures of 2-0  plain  catgut.  The skin was closed      with staples.   The patient tolerated the procedure well.  She received two      grams of Ancef at cord clamp.  All  sponge,  lap  and  needle  counts  were      correct x 2.  The patient was then taken to the PACU in stable condition.                                                                          _____________________________________                                            _____                                            Lajoyce Corners, MD      [id number]      D: 07/29/200211:37 P      T: 11/22/2000  8:13 A      J: [id number]      N: [id number]      CC: Lajoyce Corners, MD

## 2012-04-16 NOTE — Discharge Summary (Unsigned)
PATIENTBERNADEAN, [name]      MEDICAL RECORD NUMBER:        [id number]            ATTENDING PHYSICIAN:          Rickey Primus, MD      DATE OF ADMISSION:            04/02/2001      DATE OF DISCHARGE:            04/05/2001            PRINCIPAL DIAGNOSIS:                Hepatitis.            BRIEF HOSPITAL COURSE:                    This is a female who had      undergone an uneventful laparoscopic cholecystectomy.  She came in with      markedly elevated liver function tests.  Initial ultrasound showed no      obstruction and initial CAT scan was unremarkable.  She was seen by GI and      it was felt that she had form of hepatitis.  Ultimately her hepatitis      serologies came back positive for hepatitis B.            The plan will be to follow  with GI.            ___________________________________      Rickey Primus, MD            SFA:amg:rmd      D:    05/22/2001      T:    05/24/2001      #:    [id number]      N:    [id number]            cc::  Rickey Primus, MD            Bernerd Pho, MD

## 2012-04-27 NOTE — Discharge Summary (Signed)
ATTENDING MD:  Everlean Patterson, MD      ADMITTED:      06/21/2004      DISCHARGED:    06/23/2004            DISCHARGE DIAGNOSES      1.   New adult onset diabetes mellitus.      2.   Obesity.            HISTORY OF PRESENT ILLNESS:  This is a 37 year old female who came into the      emergency department for palpitations.  Chemistry was done and she was      found to have elevated glucose.            HOSPITAL COURSE:  The patient was admitted with a diagnosis of new adult      onset diabetes mellitus, was placed on IV fluids.  Initially, she was      placed on insulin drip.  Her methadone level was slightly positive.  The      patient was started p.o. medications, Glucophage and glipizide.  She was      placed on an 1800-calorie ADA diet. The patient had diabetic teaching with      nutritional consult.  The patient was also given a glucometer machine and      was told how to use and how to do her blood sugar and to keep a log of it.      No complications during the hospital course.            CONDITION ON DISCHARGE:  Stable.            DISCHARGE INSTRUCTIONS:  Activity as tolerated.  Diet is an 1800-calorie      ADA diet.            DISCHARGE MEDICATIONS:  Glucophage 1000 mg p.o. b.i.d., glipizide 5 mg p.o.      b.i.d., Darvocet-N 100 mg 1-2 q.4 h. p.r.n. for her ankle pain.            FOLLOW UP CARE:  The patient is to follow up with her primary care      physician in 1-2 weeks after discharge.            The patient was advised on diabetic diet, watching her weight, exercising,      and losing weight to help control her diabetes.  Any further complications,      she should report to the emergency department or follow up with her primary      care physician, who is not a [facility]      Physician.                  Electronic Signing MD: Everlean Patterson, MD      D 06/23/2004  9:02 A; T 06/23/2004  9:10 A; [id number] - - , [id number], #[id number]      CC:  Everlean Patterson, MD

## 2012-05-03 ENCOUNTER — Emergency Department
Admission: EM | Admit: 2012-05-03 | Discharge: 2012-05-03 | Disposition: A | Discharge status: Home / Self Care | Payer: Medicare Other | Attending: Emergency Medicine | Admitting: Emergency Medicine

## 2012-05-03 ENCOUNTER — Emergency Department: Payer: Medicare Other

## 2012-05-03 DIAGNOSIS — L02219 Cutaneous abscess of trunk, unspecified: Secondary | ICD-10-CM | POA: Insufficient documentation

## 2012-05-03 DIAGNOSIS — E119 Type 2 diabetes mellitus without complications: Secondary | ICD-10-CM | POA: Insufficient documentation

## 2012-05-03 DIAGNOSIS — E669 Obesity, unspecified: Secondary | ICD-10-CM | POA: Insufficient documentation

## 2012-05-03 LAB — BASIC METABOLIC PANEL
BUN: 7 mg/dL (ref 7–21)
CO2: 22 mEq/L (ref 22–29)
Calcium: 9.3 mg/dL (ref 8.5–10.5)
Chloride: 101 mEq/L (ref 98–107)
Creatinine: 0.8 mg/dL (ref 0.6–1.0)
Glucose: 260 mg/dL — ABNORMAL HIGH (ref 70–100)
Potassium: 4 mEq/L (ref 3.5–5.1)
Sodium: 135 mEq/L — ABNORMAL LOW (ref 136–145)

## 2012-05-03 LAB — CBC
Hematocrit: 36.2 % — ABNORMAL LOW (ref 37.0–47.0)
Hgb: 12.4 g/dL (ref 12.0–16.0)
MCH: 26.5 pg — ABNORMAL LOW (ref 28.0–32.0)
MCHC: 34.3 g/dL (ref 32.0–36.0)
MCV: 77.4 fL — ABNORMAL LOW (ref 80.0–100.0)
MPV: 9.4 fL (ref 9.4–12.3)
Nucleated RBC: 0 /100 WBC (ref 0–1)
Platelets: 362 10*3/uL (ref 140–400)
RBC: 4.68 10*6/uL (ref 4.20–5.40)
RDW: 15 % (ref 12–15)
WBC: 10.15 10*3/uL (ref 3.50–10.80)

## 2012-05-03 LAB — POCT GLUCOSE: Whole Blood Glucose POCT: 253 mg/dL — AB (ref 70–100)

## 2012-05-03 LAB — GFR: EGFR: >60

## 2012-05-03 MED ORDER — OXYCODONE HCL 5 MG PO TABS
ORAL_TABLET | ORAL | Status: AC
Start: 2012-05-03 — End: 2012-05-13

## 2012-05-03 MED ORDER — OXYCODONE HCL 5 MG PO TABS
10.0000 mg | ORAL_TABLET | Freq: Once | ORAL | Status: AC
Start: 2012-05-03 — End: 2012-05-03
  Administered 2012-05-03: 10 mg via ORAL
  Filled 2012-05-03: qty 2

## 2012-05-03 MED ORDER — SULFAMETHOXAZOLE-TRIMETHOPRIM 800-160 MG PO TABS
1.0000 | ORAL_TABLET | Freq: Two times a day (BID) | ORAL | Status: AC
Start: 2012-05-03 — End: 2012-05-10

## 2012-05-03 MED ORDER — SULFAMETHOXAZOLE-TMP DS 800-160 MG PO TABS
1.0000 | ORAL_TABLET | Freq: Once | ORAL | Status: AC
Start: 2012-05-03 — End: 2012-05-03
  Administered 2012-05-03: 1 via ORAL
  Filled 2012-05-03: qty 1

## 2012-05-03 MED ORDER — CEPHALEXIN 250 MG PO CAPS
500.00 mg | ORAL_CAPSULE | Freq: Once | ORAL | Status: AC
Start: 2012-05-03 — End: 2012-05-03
  Administered 2012-05-03: 500 mg via ORAL
  Filled 2012-05-03: qty 2

## 2012-05-03 MED ORDER — IBUPROFEN 600 MG PO TABS
600.0000 mg | ORAL_TABLET | Freq: Four times a day (QID) | ORAL | Status: AC | PRN
Start: 2012-05-03 — End: 2012-05-13

## 2012-05-03 MED ORDER — LIDOCAINE-EPINEPHRINE 1 %-1:100000 IJ SOLN
15.0000 mL | Freq: Once | INTRAMUSCULAR | Status: AC
Start: 2012-05-03 — End: 2012-05-03
  Administered 2012-05-03: 1 mL via INTRADERMAL
  Filled 2012-05-03: qty 1

## 2012-05-03 MED ORDER — SODIUM CHLORIDE 0.9 % IV BOLUS
1000.0000 mL | Freq: Once | INTRAVENOUS | Status: AC
Start: 2012-05-03 — End: 2012-05-03
  Administered 2012-05-03: 1000 mL via INTRAVENOUS

## 2012-05-03 NOTE — ED Notes (Signed)
Right groin area abscess

## 2012-05-03 NOTE — ED Provider Notes (Signed)
EMERGENCY DEPARTMENT NOTE    Physician/Midlevel provider first contact with patient: 05/03/12 1922         HISTORY OF PRESENT ILLNESS   Historian: patient  Translator Used: no    37 y.o. female presents with right groin abscess.    1. Location of symptoms: right groin  2. Onset of symptoms: few days ago  3. What was patient doing when symptoms started (Context): n/a  4. Severity: severe  5. Timing: gradual onset, severe  6. Activities that worsen symptoms: movement or right leg  7. Activities that improve symptoms: resting  8. Quality: swelling, redness, sharp pain right groin concerning for abscess/boil to patient   9. Radiation of symptoms: none  10. Associated signs and Symptoms: blood sugars unchanged per patient (100's to lower 200's), no fevers or chills  11. Are symptoms worsening? yes  MEDICAL HISTORY     Past Medical History:  Past Medical History   Diagnosis Date   . Diabetes mellitus without complication    . Asthma without status asthmaticus    prior hx of boils in groin area which resolve don their own without I&D    Past Surgical History:  Past Surgical History   Procedure Date   . Cesarean section    . Cholecystectomy    . Tonsillectomy    . Hernia repair        Social History:  History     Social History   . Marital Status: Single     Spouse Name: N/A     Number of Children: N/A   . Years of Education: N/A     Occupational History   . Not on file.     Social History Main Topics   . Smoking status: Never Smoker    . Smokeless tobacco: Not on file   . Alcohol Use: No   . Drug Use: No   . Sexually Active: Not on file     Other Topics Concern   . Not on file     Social History Narrative   . No narrative on file       Family History:  History reviewed. No pertinent family history.    Outpatient Medication:  Previous Medications    METFORMIN (GLUCOPHAGE) 1000 MG TABLET    Take 1,000 mg by mouth 2 (two) times daily with meals.       Allergies:  No Known Allergies      REVIEW OF SYSTEMS   Review of Systems    Constitutional: Negative for fever, chills and malaise/fatigue.   Gastrointestinal: Negative for nausea and vomiting.   Musculoskeletal:        Right groin abscess   Neurological: Negative for weakness.   Endo/Heme/Allergies: Positive for polydipsia.   All other systems reviewed and are negative.          PHYSICAL EXAM     Filed Vitals:    05/03/12 1852   BP: 112/63   Pulse: 97   Temp: 98.3 F (36.8 C)   Resp: 18   SpO2: 99%       Nursing note and vitals reviewed.    Constitutional: Uncomfortable, obese.  Head: Atraumatic.  Eyes: EOMI. No scleral icterus.  ENT: Mucous membranes are moist and intact.  Neck: Supple.  Cardiovascular: Regular rate. Regular rhythm.  Pulmonary/Chest: No evidence of respiratory distress.  GI: Obese, soft, non-distended abdomen.  Skin: Right groin: swollen, fluctuant, warm, tender mass measuring 2x2 cm in size without surrounding erythema  Neurological:  Awake, alert and oriented x 3. CN II-XII intact. Strength intact.  Psychiatric: Appropriate affect. Appropriate mood. Appropriate behavior.    MEDICAL DECISION MAKING   Patient presents with right groin abscess. Hx of DM. BS elevated 253. BMP shows no acidosis. Fluids administered for hyperglycemia. WBC wnl. Afebrile. Administered bactrim, keflex for cellulitis surrounding abscess. Pain meds administered. Performed I and D with good results. Packing left in place. Instructions provided.    Discharge Instructions:  You have a right groin abscess.  We drained this in the ER.  Packing was left behind.  Do not remove it.  Return to the ER or your primary doctor in 2 days to have your abscess looked at again and to remove packing.  Do hot compresses at home using a clean, warm, wet wash cloth at least twice daily.  Replacing dressing twice daily.  Take Bactrim for the next 7 days.  Return to the ER immediately for new/worsening illness such as spreading redness, increasing pain, fevers, chills, elevated blood sugars.  Take Ibuprofen for  pain.  Take Oxycodone as needed for severe pain.    PROCEDURES:  PROCEDURE: INCISION & DRAINAGE:    Performed by the emergency provider  Indication: Abscess  Location:  right groin  Preparation: The area was prepped and draped in the usual sterile fashion and was cleansed with betadine.  Local infiltration of Lidocaine 1% with Epi 10 cc was used for anesthesia.   Procedure:  The most fluctuant portion of the abscess was incised with a #11 scalpel.   Approximately 5 mL of pus and serosanguinous fluid was obtained. Abscess flushed with saline and suctioned. The abscess was packed.  A dressing was applied by me.    Post-Procedure:  On exam the abscess is notably less fluctuant.  The patient tolerated the procedure well, and there were no complications.  Cultured: No      DISCUSSION      Vital Signs: Reviewed the patient?s vital signs.   Nursing Notes: Reviewed and utilized available nursing notes.  Medical Records Reviewed: Reviewed available past medical records.  Counseling: The emergency provider has spoken with the patient and discussed today?s findings, in addition to providing specific details for the plan of care.  Questions are answered and there is agreement with the plan.    CARDIAC STUDIES    The following cardiac studies were independently interpreted by the Emergency Medicine Physician.  For full cardiac study results please see chart.    IMAGING STUDIES    The following imaging studies were independently interpreted by the Emergency Medicine Physician.  For full imaging study results please see chart.    PULSE OXIMETRY    Oxygen Saturation by Pulse Oximetry: 99%  Interventions: none  Interpretation: normal oxygenation    EMERGENCY DEPT. MEDICATIONS      ED Medication Orders      Start     Status Ordering Provider    05/03/12 2200   sulfamethoxazole-trimethoprim (BACTRIM DS) 800-160 MG per tablet 1 tablet   Once      Route: Oral  Ordered Dose: 1 tablet         Last MAR action:  Given [name]  [facility]    05/03/12 2200   cephALEXin (KEFLEX) capsule 500 mg   Once      Route: Oral  Ordered Dose: 500 mg         Last MAR action:  Given [name] [facility]    05/03/12 2100   sodium chloride 0.9 %  bolus 1,000 mL   Once      Route: Intravenous  Ordered Dose: 1,000 mL         Last MAR action:  Stopped [name] [facility]    05/03/12 2015   lidocaine-EPINEPHrine (XYLOCAINE W/EPI) 1 %-1:100000 injection 15 mL   Once      Route: Intradermal  Ordered Dose: 15 mL         Last MAR action:  Given [name] [facility]    05/03/12 2015   oxyCODONE (ROXICODONE) immediate release tablet 10 mg   Once      Route: Oral  Ordered Dose: 10 mg         Last MAR action:  Given Dorothy [name] [facility]                LABORATORY RESULTS    Ordered and independently interpreted AVAILABLE laboratory tests. Please see results section in chart for full details.  Results for orders placed during the hospital encounter of 05/03/12   POCT GLUCOSE       Component Value Range    POCT Glucose WB 253 (*) 70 - 100 mg/dL   CBC       Component Value Range    WBC 10.15  3.50 - 10.80 x10 3/uL    RBC 4.68  4.20 - 5.40 x10 6/uL    Hgb 12.4  12.0 - 16.0 g/dL    Hematocrit 24.4 (*) 37.0 - 47.0 %    MCV 77.4 (*) 80.0 - 100.0 fL    MCH 26.5 (*) 28.0 - 32.0 pg    MCHC 34.3  32.0 - 36.0 g/dL    RDW 15  12 - 15 %    Platelets 362  140 - 400 x10 3/uL    MPV 9.4  9.4 - 12.3 fL    Nucleated RBC 0  0 - 1 /100 WBC   BASIC METABOLIC PANEL       Component Value Range    Glucose 260 (*) 70 - 100 mg/dL    BUN 7  7 - 21 mg/dL    Creatinine 0.8  0.6 - 1.0 mg/dL    Calcium 9.3  8.5 - 01.0 mg/dL    Sodium 272 (*) 536 - 145 mEq/L    Potassium 4.0  3.5 - 5.1 mEq/L    Chloride 101  98 - 107 mEq/L    CO2 22  22 - 29 mEq/L   GFR       Component Value Range    EGFR >60.0         CONSULTATIONS        CRITICAL CARE        ATTESTATIONS        Physician Attestation: Darlyn Read MD, have been the primary provider for Dorothy [name] during this Emergency Dept  visit and have reviewed the chart for accuracy and agree with its content.       DIAGNOSIS      Diagnosis:  Final diagnoses:   Obesity   DM (diabetes mellitus)   Hyperglycemia   Abscess, groin   Cellulitis of groin       Disposition:  ED Disposition     Discharge Dorothy [name] discharge to home/self care.    Condition at discharge: Good            Prescriptions:  Patient's Medications   New Prescriptions    IBUPROFEN (ADVIL,MOTRIN) 600 MG TABLET    Take 1 tablet (600  mg total) by mouth every 6 (six) hours as needed for Pain or Fever.    OXYCODONE (ROXICODONE) 5 MG IMMEDIATE RELEASE TABLET    1-2 tablets every 4-6 hours as needed for pain    SULFAMETHOXAZOLE-TRIMETHOPRIM (BACTRIM DS) 800-160 MG PER TABLET    Take 1 tablet by mouth 2 (two) times daily.   Previous Medications    METFORMIN (GLUCOPHAGE) 1000 MG TABLET    Take 1,000 mg by mouth 2 (two) times daily with meals.   Modified Medications    No medications on file   Discontinued Medications    IBUPROFEN (ADVIL,MOTRIN) 600 MG TABLET    Take 600 mg by mouth every 6 (six) hours as needed.         Marland Mcalpine, MD  05/04/12 [phone]

## 2012-05-03 NOTE — Discharge Instructions (Signed)
You have a right groin abscess.  We drained this in the ER.  Packing was left behind.  Do not remove it.  Return to the ER or your primary doctor in 2 days to have your abscess looked at again and to remove packing.  Do hot compresses at home using a clean, warm, wet wash cloth at least twice daily.  Replacing dressing twice daily.  Take Bactrim for the next 7 days.  Return to the ER immediately for new/worsening illness such as spreading redness, increasing pain, fevers, chills, elevated blood sugars.  Take Ibuprofen for pain.  Take Oxycodone as needed for severe pain.    Abscess    You were diagnosed with an abscess of the:   Skin.    An abscess is a sore that is infected and filled with pus. It is caused by an infection with bacteria. Sometimes a splinter or other object stuck in the skin can cause an infection that can become an abscess. The body traps the infection in a tight pocket to try to stop the infection from spreading to other areas. The usual treatment is to make a cut in the abscess so the pus can drain out. Most abscesses heal quickly with no need for antibiotics.    Your doctor has determined that antibiotics ARE necessary to treat your abscess. Fill the prescription and take all medications as prescribed until they are all gone.    A drain and/or packing have been placed in your abscess to help the wound heal. The packing in your wound will need to be changed. This can be done by your family doctor, a referral doctor, this facility or the nearest Emergency Department. Your physician will set up a plan for you to have the packing changed. Leave the drain and packing in place until you see a doctor. The drain might fall out on it's own. If this happens, cover the abscess with a clean dressing (bandage) and follow up with a doctor as scheduled. Until the packing is removed, don't soak the wound in water, like in a bathtub or pool. Short showers or sponge baths are okay. Keep the wound as dry and  clean as possible.    YOU SHOULD SEEK MEDICAL ATTENTION IMMEDIATELY, EITHER HERE OR AT THE NEAREST EMERGENCY DEPARTMENT, IF ANY OF THE FOLLOWING OCCURS:   You see unusual redness or swelling.   You see red streaks on the arm or leg.   The wound or drainage smells bad.   You have fevers, chills, worse pain or swelling.

## 2012-05-03 NOTE — ED Notes (Signed)
I&D done by dr Thad Ranger and assisted by this rn, pt tolerated well, given bagged lunch

## 2012-05-06 ENCOUNTER — Emergency Department
Admission: EM | Admit: 2012-05-06 | Discharge: 2012-05-06 | Disposition: A | Discharge status: Home / Self Care | Payer: Medicare Other | Attending: Emergency Medicine | Admitting: Emergency Medicine

## 2012-05-06 ENCOUNTER — Emergency Department: Payer: Medicare Other

## 2012-05-06 DIAGNOSIS — J45909 Unspecified asthma, uncomplicated: Secondary | ICD-10-CM | POA: Insufficient documentation

## 2012-05-06 DIAGNOSIS — Z4801 Encounter for change or removal of surgical wound dressing: Secondary | ICD-10-CM | POA: Insufficient documentation

## 2012-05-06 DIAGNOSIS — E119 Type 2 diabetes mellitus without complications: Secondary | ICD-10-CM | POA: Insufficient documentation

## 2012-05-06 LAB — POCT GLUCOSE
Whole Blood Glucose POCT: 435 mg/dL — AB (ref 70–100)
Whole Blood Glucose POCT: 391 mg/dL — AB (ref 70–100)
Whole Blood Glucose POCT: 225 mg/dL — AB (ref 70–100)

## 2012-05-06 MED ORDER — MORPHINE SULFATE 4 MG/ML IJ/IV SOLN (WRAP)
INTRAVENOUS | Status: DC
Start: 2012-05-06 — End: 2012-05-07
  Filled 2012-05-06: qty 1

## 2012-05-06 MED ORDER — ONDANSETRON HCL 4 MG/2ML IJ SOLN
INTRAMUSCULAR | Status: DC
Start: 2012-05-06 — End: 2012-05-07
  Filled 2012-05-06: qty 2

## 2012-05-06 MED ORDER — INSULIN REGULAR HUMAN 100 UNIT/ML IJ SOLN
10.0000 [IU] | Freq: Once | INTRAMUSCULAR | Status: DC
Start: 2012-05-06 — End: 2012-05-07
  Filled 2012-05-06: qty 30

## 2012-05-06 MED ORDER — SODIUM CHLORIDE 0.9 % IV BOLUS
1000.0000 mL | Freq: Once | INTRAVENOUS | Status: AC
Start: 2012-05-06 — End: 2012-05-06
  Administered 2012-05-06: 1000 mL via INTRAVENOUS

## 2012-05-06 MED ORDER — KETOROLAC TROMETHAMINE 30 MG/ML IJ SOLN
INTRAMUSCULAR | Status: DC
Start: 2012-05-06 — End: 2012-05-07
  Filled 2012-05-06: qty 1

## 2012-05-06 NOTE — ED Provider Notes (Signed)
EMERGENCY DEPARTMENT NOTE    Physician/Midlevel provider first contact with patient: 05/06/12 1928         HISTORY OF PRESENT ILLNESS   Historian:Patient  Translator Used: No    37 y.o. female     1. Location of symptoms:RIGHT GROIN  2. Onset of symptoms:THREE DAYS AGO  3. What was patient doing when symptoms started (Context):I&D THREE DAYS AGO IN ED, PACKING IN PLACE, ABSCESS HEALING WELL, DRAINING MODERATE AMOUNT OF PURULENT DRAINAGE, NO FEVER, PT TAKES ABX AS RX  4. Severity:  5. Timing:   6. Activities that worsen symptoms:  7. Activities that improve symptoms:   8. Quality:   9. Radiation of symptoms:  10. Associated signs and Symptoms:  11. Are symptoms worsening?  MEDICAL HISTORY     Past Medical History:  Past Medical History   Diagnosis Date   . Diabetes mellitus without complication    . Asthma without status asthmaticus        Past Surgical History:  Past Surgical History   Procedure Date   . Cesarean section    . Cholecystectomy    . Tonsillectomy    . Hernia repair        Social History:  History     Social History   . Marital Status: Single     Spouse Name: N/A     Number of Children: N/A   . Years of Education: N/A     Occupational History   . Not on file.     Social History Main Topics   . Smoking status: Never Smoker    . Smokeless tobacco: Not on file   . Alcohol Use: No   . Drug Use: No   . Sexually Active: Not on file     Other Topics Concern   . Not on file     Social History Narrative   . No narrative on file       Family History:  History reviewed. No pertinent family history.    Outpatient Medication:  Previous Medications    IBUPROFEN (ADVIL,MOTRIN) 600 MG TABLET    Take 1 tablet (600 mg total) by mouth every 6 (six) hours as needed for Pain or Fever.    METFORMIN (GLUCOPHAGE) 1000 MG TABLET    Take 1,000 mg by mouth 2 (two) times daily with meals.    OXYCODONE (ROXICODONE) 5 MG IMMEDIATE RELEASE TABLET    1-2 tablets every 4-6 hours as needed for pain    SULFAMETHOXAZOLE-TRIMETHOPRIM  (BACTRIM DS) 800-160 MG PER TABLET    Take 1 tablet by mouth 2 (two) times daily.         REVIEW OF SYSTEMS   Review of Systems   Constitutional: Negative for fever and chills.   Respiratory: Negative for cough.    Cardiovascular: Negative for chest pain.   Gastrointestinal: Negative for nausea, vomiting, abdominal pain and diarrhea.   Musculoskeletal: Negative for back pain and joint pain.   Skin: Negative for rash.   Neurological: Negative for headaches.       PHYSICAL EXAM   ED Triage Vitals   Enc Vitals Group      BP 05/06/12 1923 141/87 mmHg      Heart Rate 05/06/12 1923 91       Resp --       Temp 05/06/12 1923 97.7 F (36.5 C)      Temp Source 05/06/12 1923 Oral      SpO2 05/06/12 1923 98 %  Weight 05/06/12 1923 158.759 kg      Height 05/06/12 1923 1.803 m      Head Cir --       Peak Flow --       Pain Score 05/06/12 1923 Nine      Pain Loc --       Pain Edu? --       Excl. in GC? --      Nursing note and vitals reviewed.  Constitutional:  Well developed, well nourished. Awake & Oriented x3.  Head:  Atraumatic. Normocephalic.    Eyes:  PERRL. EOMI. Conjunctivae are not pale.  ENT:  Mucous membranes are moist and intact. Oropharynx is clear and symmetric.  Patent airway.  Neck:  Supple. Full ROM.    Cardiovascular:  Regular rate. Regular rhythm. No murmurs, rubs, or gallops.  Pulmonary/Chest:  No evidence of respiratory distress. Clear to auscultation bilaterally.  No wheezing, rales or rhonchi.   Abdominal:  Soft and non-distended. There is no tenderness. No rebound, guarding, or rigidity.  Back:  Full ROM. Nontender.  Extremities:  No edema. No cyanosis. No clubbing. Full range of motion in all extremities.  Skin:  Skin is warm and dry.  No diaphoresis. No rash. RIGHT GROIN WITH WELL HEALING ABSCESS, MODERATE AMOUNT OF PURULENT DRAINAGE NOTED, PACKING REPLACED.  Neurological:  Alert, awake, and appropriate. Normal speech. Motor normal.  Psychiatric:  Good eye contact. Normal interaction, affect, and  behavior.        MEDICAL DECISION MAKING   PT INSTRUCTED TO CONTINUE ABX AND RETURN IN 48 HOURS FOR WOUND CHECK, PT VERBALIZED UNDERSTANDING, AGREED TO PLAN.  DISCUSSION      Vital Signs: Reviewed the patient?s vital signs.   Nursing Notes: Reviewed and utilized available nursing notes.  Medical Records Reviewed: Reviewed available past medical records.  Counseling: The emergency provider has spoken with the patient and discussed today?s findings, in addition to providing specific details for the plan of care.  Questions are answered and there is agreement with the plan.                PULSE OXIMETRY    Oxygen Saturation by Pulse Oximetry: 98%  Interventions:   Interpretation:     EMERGENCY DEPT. MEDICATIONS      ED Medication Orders     None          LABORATORY RESULTS    Ordered and independently interpreted AVAILABLE laboratory tests. Please see results section in chart for full details.  Results for orders placed during the hospital encounter of 05/03/12   POCT GLUCOSE       Component Value Range    POCT Glucose WB 253 (*) 70 - 100 mg/dL   CBC       Component Value Range    WBC 10.15  3.50 - 10.80 x10 3/uL    RBC 4.68  4.20 - 5.40 x10 6/uL    Hgb 12.4  12.0 - 16.0 g/dL    Hematocrit 16.1 (*) 37.0 - 47.0 %    MCV 77.4 (*) 80.0 - 100.0 fL    MCH 26.5 (*) 28.0 - 32.0 pg    MCHC 34.3  32.0 - 36.0 g/dL    RDW 15  12 - 15 %    Platelets 362  140 - 400 x10 3/uL    MPV 9.4  9.4 - 12.3 fL    Nucleated RBC 0  0 - 1 /100 WBC   BASIC METABOLIC PANEL  Component Value Range    Glucose 260 (*) 70 - 100 mg/dL    BUN 7  7 - 21 mg/dL    Creatinine 0.8  0.6 - 1.0 mg/dL    Calcium 9.3  8.5 - 09.8 mg/dL    Sodium 119 (*) 147 - 145 mEq/L    Potassium 4.0  3.5 - 5.1 mEq/L    Chloride 101  98 - 107 mEq/L    CO2 22  22 - 29 mEq/L   GFR       Component Value Range    EGFR >60.0           DIAGNOSIS      Diagnosis:  Final diagnoses:   Wound check, abscess       Disposition:  ED Disposition     Discharge Dorothy [name] discharge to  home/self care.    Condition at discharge: Stable            Prescriptions:  Patient's Medications   New Prescriptions    No medications on file   Previous Medications    IBUPROFEN (ADVIL,MOTRIN) 600 MG TABLET    Take 1 tablet (600 mg total) by mouth every 6 (six) hours as needed for Pain or Fever.    METFORMIN (GLUCOPHAGE) 1000 MG TABLET    Take 1,000 mg by mouth 2 (two) times daily with meals.    OXYCODONE (ROXICODONE) 5 MG IMMEDIATE RELEASE TABLET    1-2 tablets every 4-6 hours as needed for pain    SULFAMETHOXAZOLE-TRIMETHOPRIM (BACTRIM DS) 800-160 MG PER TABLET    Take 1 tablet by mouth 2 (two) times daily.   Modified Medications    No medications on file   Discontinued Medications    No medications on file           Atha Starks, NP  05/06/12 1953

## 2012-05-06 NOTE — ED Notes (Signed)
Was seen here 1/9 for R lower abd abscess, which was I&D'd. States that the wick fell out and its getting worse with increasing pain and malodorous drainage.

## 2012-05-07 ENCOUNTER — Emergency Department: Payer: Medicare Other

## 2012-05-07 ENCOUNTER — Emergency Department
Admission: EM | Admit: 2012-05-07 | Discharge: 2012-05-08 | Disposition: A | Discharge status: Home / Self Care | Payer: Medicare Other | Attending: Emergency Medicine | Admitting: Emergency Medicine

## 2012-05-07 DIAGNOSIS — L02219 Cutaneous abscess of trunk, unspecified: Secondary | ICD-10-CM | POA: Insufficient documentation

## 2012-05-07 DIAGNOSIS — J45909 Unspecified asthma, uncomplicated: Secondary | ICD-10-CM | POA: Insufficient documentation

## 2012-05-07 DIAGNOSIS — E119 Type 2 diabetes mellitus without complications: Secondary | ICD-10-CM | POA: Insufficient documentation

## 2012-05-07 LAB — CBC AND DIFFERENTIAL
Basophils Absolute Automated: 0.02 10*3/uL (ref 0.00–0.20)
Basophils Automated: 0 %
Eosinophils Absolute Automated: 0.14 10*3/uL (ref 0.00–0.70)
Eosinophils Automated: 2 %
Hematocrit: 33.8 % — ABNORMAL LOW (ref 37.0–47.0)
Hgb: 11.3 g/dL — ABNORMAL LOW (ref 12.0–16.0)
Immature Granulocytes Absolute: 0.03 10*3/uL
Immature Granulocytes: 1 %
Lymphocytes Absolute Automated: 2.25 10*3/uL (ref 0.50–4.40)
Lymphocytes Automated: 37 %
MCH: 25.9 pg — ABNORMAL LOW (ref 28.0–32.0)
MCHC: 33.4 g/dL (ref 32.0–36.0)
MCV: 77.3 fL — ABNORMAL LOW (ref 80.0–100.0)
MPV: 9.5 fL (ref 9.4–12.3)
Monocytes Absolute Automated: 0.38 10*3/uL (ref 0.00–1.20)
Monocytes: 6 %
Neutrophils Absolute: 3.23 10*3/uL (ref 1.80–8.10)
Neutrophils: 54 %
Nucleated RBC: 0 /100 WBC (ref 0–1)
Platelets: 343 10*3/uL (ref 140–400)
RBC: 4.37 10*6/uL (ref 4.20–5.40)
RDW: 15 % (ref 12–15)
WBC: 6.02 10*3/uL (ref 3.50–10.80)

## 2012-05-07 LAB — COMPREHENSIVE METABOLIC PANEL
ALT: 9 U/L (ref 0–55)
AST (SGOT): 13 U/L (ref 5–34)
Albumin/Globulin Ratio: 0.9 (ref 0.9–2.2)
Albumin: 3 g/dL — ABNORMAL LOW (ref 3.5–5.0)
Alkaline Phosphatase: 88 U/L (ref 40–150)
BUN: 8 mg/dL (ref 7–21)
Bilirubin, Total: 0.3 mg/dL (ref 0.2–1.2)
CO2: 22 mEq/L (ref 22–29)
Calcium: 8.5 mg/dL (ref 8.5–10.5)
Chloride: 102 mEq/L (ref 98–107)
Creatinine: 0.8 mg/dL (ref 0.6–1.0)
Globulin: 3.4 g/dL (ref 2.0–3.6)
Glucose: 340 mg/dL — ABNORMAL HIGH (ref 70–100)
Potassium: 3.9 mEq/L (ref 3.5–5.1)
Protein, Total: 6.4 g/dL (ref 6.0–8.3)
Sodium: 134 mEq/L — ABNORMAL LOW (ref 136–145)

## 2012-05-07 LAB — POCT GLUCOSE: Whole Blood Glucose POCT: 345 mg/dL — AB (ref 70–100)

## 2012-05-07 LAB — GFR: EGFR: >60

## 2012-05-07 MED ORDER — ONDANSETRON 4 MG PO TBDP
4.0000 mg | ORAL_TABLET | Freq: Four times a day (QID) | ORAL | Status: AC | PRN
Start: 2012-05-07 — End: 2012-05-14

## 2012-05-07 MED ORDER — SODIUM CHLORIDE 0.9 % IV BOLUS
1000.0000 mL | Freq: Once | INTRAVENOUS | Status: AC
Start: 2012-05-07 — End: 2012-05-08
  Administered 2012-05-07: 1000 mL via INTRAVENOUS

## 2012-05-07 MED ORDER — HYDROCODONE-ACETAMINOPHEN 5-325 MG PO TABS
1.00 | ORAL_TABLET | Freq: Four times a day (QID) | ORAL | Status: AC | PRN
Start: 2012-05-07 — End: 2012-05-17

## 2012-05-07 MED ORDER — INSULIN REGULAR HUMAN 100 UNIT/ML IJ SOLN
5.0000 [IU] | Freq: Once | INTRAMUSCULAR | Status: AC
Start: 2012-05-07 — End: 2012-05-07
  Administered 2012-05-07: 5 [IU] via INTRAVENOUS
  Filled 2012-05-07: qty 15

## 2012-05-07 MED ORDER — HYDROCODONE-ACETAMINOPHEN 5-325 MG PO TABS
2.0000 | ORAL_TABLET | Freq: Once | ORAL | Status: AC
Start: 2012-05-07 — End: 2012-05-07
  Administered 2012-05-07: 2 via ORAL
  Filled 2012-05-07: qty 2

## 2012-05-07 MED ORDER — ONDANSETRON HCL 4 MG/2ML IJ SOLN
4.0000 mg | Freq: Once | INTRAMUSCULAR | Status: AC
Start: 2012-05-07 — End: 2012-05-07
  Administered 2012-05-07: 4 mg via INTRAVENOUS
  Filled 2012-05-07: qty 2

## 2012-05-07 NOTE — ED Notes (Signed)
Chest pain started 1900 today with dizziness
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
## 2012-05-07 NOTE — ED Notes (Signed)
Patient referred to [facility]  [phone]  [place]
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
## 2012-05-07 NOTE — ED Notes (Signed)
Dexi 285

## 2012-05-08 LAB — ECG 12-LEAD
Atrial Rate: 82 {beats}/min
P Axis: 59 degrees
P-R Interval: 150 ms
Q-T Interval: 376 ms
QRS Duration: 102 ms
QTC Calculation (Bezet): 439 ms
R Axis: 5 degrees
T Axis: 20 degrees
Ventricular Rate: 82 {beats}/min

## 2012-05-08 LAB — POCT GLUCOSE: Whole Blood Glucose POCT: 229 mg/dL — AB (ref 70–100)

## 2012-05-08 NOTE — Discharge Instructions (Signed)
I strongly recommend you have your packing changes on your right groin abscess so I could further evaluation your groin wound.  You infection may be worsening which could lead to permanent disability or death. Please return immediately if worse in any way or change your mind and would agree to further evaluation of your groin wound otherwise follow up with your primary care doctor for further evaluation.    1. Return immediately if worse in any way.    2. Follow up with your primary medical for recheck and further evaluation.    3. Do not drive or take acetaminophen if you take norco.      Abscess    You were diagnosed with an abscess of the:   Skin.    An abscess is a sore that is infected and filled with pus. It is caused by an infection with bacteria. Sometimes a splinter or other object stuck in the skin can cause an infection that can become an abscess. The body traps the infection in a tight pocket to try to stop the infection from spreading to other areas. The usual treatment is to make a cut in the abscess so the pus can drain out. Most abscesses heal quickly with no need for antibiotics.    Your doctor has determined that antibiotics ARE necessary to treat your abscess. Fill the prescription and take all medications as prescribed until they are all gone.    A drain and/or packing have been placed in your abscess to help the wound heal. The packing in your wound will need to be changed. This can be done by your family doctor, a referral doctor, this facility or the nearest Emergency Department. Your physician will set up a plan for you to have the packing changed. Leave the drain and packing in place until you see a doctor. The drain might fall out on it's own. If this happens, cover the abscess with a clean dressing (bandage) and follow up with a doctor as scheduled. Until the packing is removed, don't soak the wound in water, like in a bathtub or pool. Short showers or sponge baths are okay. Keep the  wound as dry and clean as possible.    YOU SHOULD SEEK MEDICAL ATTENTION IMMEDIATELY, EITHER HERE OR AT THE NEAREST EMERGENCY DEPARTMENT, IF ANY OF THE FOLLOWING OCCURS:   You see unusual redness or swelling.   You see red streaks on the arm or leg.   The wound or drainage smells bad.   You have fevers, chills, worse pain or swelling.      Abscess    You were diagnosed with an abscess of the:   Skin.    An abscess is a sore that is infected and filled with pus. It is caused by an infection with bacteria. Sometimes a splinter or other object stuck in the skin can cause an infection that can become an abscess. The body traps the infection in a tight pocket to try to stop the infection from spreading to other areas. The usual treatment is to make a cut in the abscess so the pus can drain out. Most abscesses heal quickly with no need for antibiotics.    Your doctor has determined that antibiotics ARE necessary to treat your abscess. Fill the prescription and take all medications as prescribed until they are all gone.    A drain and/or packing have been placed in your abscess to help the wound heal. The packing in your wound will need  to be changed. This can be done by your family doctor, a referral doctor, this facility or the nearest Emergency Department. Your physician will set up a plan for you to have the packing changed. Leave the drain and packing in place until you see a doctor. The drain might fall out on it's own. If this happens, cover the abscess with a clean dressing (bandage) and follow up with a doctor as scheduled. Until the packing is removed, don't soak the wound in water, like in a bathtub or pool. Short showers or sponge baths are okay. Keep the wound as dry and clean as possible.    YOU SHOULD SEEK MEDICAL ATTENTION IMMEDIATELY, EITHER HERE OR AT THE NEAREST EMERGENCY DEPARTMENT, IF ANY OF THE FOLLOWING OCCURS:   You see unusual redness or swelling.   You see red streaks on the arm or  leg.   The wound or drainage smells bad.   You have fevers, chills, worse pain or swelling.

## 2012-06-14 ENCOUNTER — Emergency Department: Payer: Medicare Other

## 2012-06-14 ENCOUNTER — Emergency Department
Admission: EM | Admit: 2012-06-14 | Discharge: 2012-06-14 | Disposition: A | Discharge status: Home / Self Care | Payer: Medicare Other | Attending: Emergency Medicine | Admitting: Emergency Medicine

## 2012-06-14 DIAGNOSIS — M549 Dorsalgia, unspecified: Secondary | ICD-10-CM | POA: Insufficient documentation

## 2012-06-14 DIAGNOSIS — R0789 Other chest pain: Secondary | ICD-10-CM | POA: Insufficient documentation

## 2012-06-14 DIAGNOSIS — J45909 Unspecified asthma, uncomplicated: Secondary | ICD-10-CM | POA: Insufficient documentation

## 2012-06-14 DIAGNOSIS — E119 Type 2 diabetes mellitus without complications: Secondary | ICD-10-CM | POA: Insufficient documentation

## 2012-06-14 LAB — GFR: EGFR: >60

## 2012-06-14 LAB — CBC AND DIFFERENTIAL
Basophils Absolute Automated: 0.02 10*3/uL (ref 0.00–0.20)
Basophils Automated: 0 %
Eosinophils Absolute Automated: 0.18 10*3/uL (ref 0.00–0.70)
Eosinophils Automated: 2 %
Hematocrit: 33.3 % — ABNORMAL LOW (ref 37.0–47.0)
Hgb: 11.3 g/dL — ABNORMAL LOW (ref 12.0–16.0)
Immature Granulocytes Absolute: 0.02 10*3/uL
Immature Granulocytes: 0 %
Lymphocytes Absolute Automated: 2.59 10*3/uL (ref 0.50–4.40)
Lymphocytes Automated: 28 %
MCH: 27.2 pg — ABNORMAL LOW (ref 28.0–32.0)
MCHC: 33.9 g/dL (ref 32.0–36.0)
MCV: 80.2 fL (ref 80.0–100.0)
MPV: 9.8 fL (ref 9.4–12.3)
Monocytes Absolute Automated: 0.46 10*3/uL (ref 0.00–1.20)
Monocytes: 5 %
Neutrophils Absolute: 6.08 10*3/uL (ref 1.80–8.10)
Neutrophils: 65 %
Nucleated RBC: 0 /100 WBC (ref 0–1)
Platelets: 368 10*3/uL (ref 140–400)
RBC: 4.15 10*6/uL — ABNORMAL LOW (ref 4.20–5.40)
RDW: 15 % (ref 12–15)
WBC: 9.35 10*3/uL (ref 3.50–10.80)

## 2012-06-14 LAB — COMPREHENSIVE METABOLIC PANEL
ALT: 12 U/L (ref 0–55)
AST (SGOT): 10 U/L (ref 5–34)
Albumin/Globulin Ratio: 0.8 — ABNORMAL LOW (ref 0.9–2.2)
Albumin: 3.1 g/dL — ABNORMAL LOW (ref 3.5–5.0)
Alkaline Phosphatase: 86 U/L (ref 40–150)
BUN: 17 mg/dL (ref 7.0–19.0)
Bilirubin, Total: 0.3 mg/dL (ref 0.2–1.2)
CO2: 23 mEq/L (ref 22–29)
Calcium: 9.4 mg/dL (ref 8.5–10.5)
Chloride: 102 mEq/L (ref 98–107)
Creatinine: 0.9 mg/dL (ref 0.6–1.0)
Globulin: 3.8 g/dL — ABNORMAL HIGH (ref 2.0–3.6)
Glucose: 375 mg/dL — ABNORMAL HIGH (ref 70–100)
Potassium: 4.4 mEq/L (ref 3.5–5.1)
Protein, Total: 6.9 g/dL (ref 6.0–8.3)
Sodium: 134 mEq/L — ABNORMAL LOW (ref 136–145)

## 2012-06-14 LAB — IHS D-DIMER: D-Dimer: 0.31 ug/mL FEU (ref 0.00–0.49)

## 2012-06-14 MED ORDER — IBUPROFEN 400 MG PO TABS
800.0000 mg | ORAL_TABLET | Freq: Once | ORAL | Status: AC
Start: 2012-06-14 — End: 2012-06-14
  Administered 2012-06-14: 800 mg via ORAL
  Filled 2012-06-14: qty 2

## 2012-06-14 MED ORDER — IBUPROFEN 800 MG PO TABS
800.0000 mg | ORAL_TABLET | Freq: Three times a day (TID) | ORAL | Status: AC | PRN
Start: 2012-06-14 — End: 2012-06-24
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
## 2012-06-14 NOTE — ED Notes (Signed)
Pt laying with legs up over railing, texting on cell phone. Appears in no distress.

## 2012-06-14 NOTE — Discharge Instructions (Signed)
Take the medication you have been prescribed as instructed.    Call Mariana Single, MD in order to schedule a follow up appointment. Be sure to bring a copy of today's labs with you to your appointment.

## 2012-06-14 NOTE — ED Provider Notes (Signed)
Physician/Midlevel provider first contact with patient: 06/14/12 2226         History     Chief Complaint   Patient presents with   . Chest Pain     HPI Comments: 37yo F BIBA h/o DM, asthma, c-section, cholecystectomy c/o CP onset 1900. The pt rpts sitting at home when she started to experience R sided sharp 10/10 CP that radiates to her R shoulder and back. The pain was so severe the pt called EMS. In the ER the pt sys her CP is currently 8/10. The pt denies fever, chills, cough, SOB, diaphoresis, abd pain, n/v/d, urinary sxs, HA, paresthesia, or any other complaints.    PCP: Mariana Single, MD    Patient is a 37 y.o. female presenting with chest pain. The history is provided by the patient. No language interpreter was used.   Chest Pain  The chest pain began 3 - 5 hours ago. Chest pain occurs constantly. The chest pain is improving. At its most intense, the pain is at 10/10. The pain is currently at 8/10. The quality of the pain is described as sharp. The pain radiates to the right shoulder and upper back. Pertinent negatives for primary symptoms include no fever, no shortness of breath, no cough, no palpitations, no abdominal pain, no nausea, no vomiting and no dizziness.   Pertinent negatives for associated symptoms include no diaphoresis, no numbness and no weakness. She tried nothing for the symptoms. Risk factors include lack of exercise and obesity.         Past Medical History   Diagnosis Date   . Diabetes mellitus without complication    . Asthma without status asthmaticus        Past Surgical History   Procedure Date   . Cesarean section    . Cholecystectomy    . Tonsillectomy    . Hernia repair        No family history on file. No pertinent PFHx.    Social  History   Substance Use Topics   . Smoking status: Never Smoker    . Smokeless tobacco: Not on file   . Alcohol Use: No       .     Allergies   Allergen Reactions   . Percocet (Oxycodone-Acetaminophen)      Unknown reaction          Current/Home Medications    LISINOPRIL (PRINIVIL,ZESTRIL) 10 MG TABLET    Take 10 mg by mouth daily.    METFORMIN (GLUCOPHAGE) 1000 MG TABLET    Take 1,000 mg by mouth 2 (two) times daily with meals.        Review of Systems   Constitutional: Negative for fever, chills and diaphoresis.   HENT: Negative for congestion.    Respiratory: Negative for cough and shortness of breath.    Cardiovascular: Positive for chest pain. Negative for palpitations.   Gastrointestinal: Negative for nausea, vomiting, abdominal pain and diarrhea.   Musculoskeletal: Positive for back pain.   Neurological: Negative for dizziness, weakness and numbness.   All other systems reviewed and are negative.        Physical Exam    BP 125/72  Pulse 77  Temp 98.2 F (36.8 C) (Oral)  Resp 18  SpO2 98%  LMP 05/28/2012    Physical Exam   Constitutional: She is oriented to person, place, and time. She appears well-developed and well-nourished.   HENT:   Head: Normocephalic and atraumatic.   Mouth/Throat: Oropharynx  is clear and moist.   Eyes: Conjunctivae normal and EOM are normal. Pupils are equal, round, and reactive to light.   Neck: Normal range of motion. Neck supple.   Cardiovascular: Normal rate, regular rhythm and normal heart sounds.    Pulmonary/Chest: Effort normal and breath sounds normal.        +tender right upper chest wall   Abdominal: Soft. Bowel sounds are normal.   Musculoskeletal: Normal range of motion. She exhibits no edema (no palpable cords).   Neurological: She is alert and oriented to person, place, and time. She has normal reflexes.   Skin: Skin is warm and dry.   Psychiatric: She has a normal mood and affect. Thought content normal.       MDM and ED Course     ED Medication Orders      Start     Status Ordering Provider    06/14/12 2300   ibuprofen (ADVIL,MOTRIN) tablet 800 mg   Once      Route: Oral  Ordered Dose: 800 mg         Last MAR action:  Given Alieyah Spader J                 MDM    EKG: performed 2050  read 2252  Sinus 80  Flipped T wave in lead 3  Normal EKG    Procedures    Clinical Impression & Disposition     Clinical Impression  Final diagnoses:   Musculoskeletal chest pain        ED Disposition     Discharge ALIZZON DIOGUARDI discharge to home/self care.    Condition at discharge: Good             New Prescriptions    IBUPROFEN (ADVIL,MOTRIN) 800 MG TABLET    Take 1 tablet (800 mg total) by mouth every 8 (eight) hours as needed for Pain or Fever.        Treatment Team: Scribe: Dorena Bodo    I was acting as a scribe for Samuel Bouche, MD on [facility]  Treatment Team: Scribe: Dorena Bodo   I am the first provider for this patient and I personally performed the services documented. Treatment Team: Scribe: Dorena Bodo is scribing for me on [facility]. This note accurately reflects work and decisions made by me.  Samuel Bouche, MD    Samuel Bouche, MD  06/15/12 [phone]

## 2012-06-14 NOTE — Medical Student (Addendum)
STUDENT EMERGENCY DEPARTMENT HISTORY AND PHYSICAL EXAM  (This note is for teaching purposes and not part of the official medical record)      Date: 06/14/2012  Patient Name: Dorothy [name], Dorothy [name]  Student name: Marrion Coy      History of Presenting Illness:  Dorothy [name] is a 37 y/o female who presents with right-sided upper chest pain which began suddenly this evening.  She states that she was sitting down when the pain occurred and that it radiates to the right posterior shoulder.  She describes the pain as "sharp" which was initially a 9/10 in severity and is now 7-8/10.  She denies fever, chills, dizziness, palpitations, SOB, nausea, vomiting, and abdominal pain.    Chief Complaint:  Chest pain      History obtained from:  Patient  Interpreter used:  N/A       Past Medical History:  Diabetes, Asthma  Past Surgical History:  Cholecystectomy, C-section x4, Hernia Repair, Tonsillectomy  Social History:  Denies tobacco, EtOH, or illicit drug use      Allergies:  Allergies   Allergen Reactions   . Percocet (Oxycodone-Acetaminophen)      Unknown reaction         Medications:  No current facility-administered medications for this encounter.  Current outpatient prescriptions:lisinopril (PRINIVIL,ZESTRIL) 10 mg tablet, Take 10 mg by mouth daily., Disp: , Rfl: ;  metFORMIN (GLUCOPHAGE) 1000 MG tablet, Take 1,000 mg by mouth 2 (two) times daily with meals., Disp: , Rfl:       Physical Exam:    VS: BP 109/62  Pulse 82  Temp 98.2 F (36.8 C) (Oral)  Resp 18  SpO2 98%  LMP 05/28/2012  Constitutional: Alert and answering questions appropriately  Appearance: Lying in bed, playing with phone.  In no acute distress  Head: Atraumatic, normocephalic  Eyes:  EOMI   Respiratory/Chest: Lungs clear to auscultation bilaterally.  Adequate respiratory effort.  Cardiovascular: RRR with no m/r/g  Abdomen: Soft, non-tender, non-distended  MSK:  TTP along right upper chest just beneath the clavicle and along right scapular  spine  Psychiatric: Euthymic      Assessment/Plan:  37 y/o female with right-sided chest and posterior shoulder pain.  Most likely MSK injury but will r/o cardiac etiology and PE.  ECG normal on admission so less likely ACS.  Will order CXR and d-dimer to evaluate for PE.  Ibuprofen for pain.  Will discharge to home if work up reveals no abnormalities.    Differential Diagnosis:  Muscle strain, ACS, PE      ED Course:    Labs:  Results     ** No Results found for the last 24 hours. **          Rads:  Radiology Results (24 Hour)     ** No Results found for the last 24 hours. **

## 2012-06-15 LAB — ECG 12-LEAD
Atrial Rate: 80 {beats}/min
P Axis: 40 degrees
P-R Interval: 128 ms
Q-T Interval: 380 ms
QRS Duration: 78 ms
QTC Calculation (Bezet): 438 ms
R Axis: 7 degrees
T Axis: 11 degrees
Ventricular Rate: 80 {beats}/min

## 2012-06-20 ENCOUNTER — Emergency Department: Payer: Medicare Other

## 2012-06-20 ENCOUNTER — Emergency Department
Admission: EM | Admit: 2012-06-20 | Discharge: 2012-06-20 | Discharge status: Against Medical Advice | Payer: Medicare Other | Attending: Emergency Medicine | Admitting: Emergency Medicine

## 2012-06-20 DIAGNOSIS — R42 Dizziness and giddiness: Secondary | ICD-10-CM | POA: Insufficient documentation

## 2012-06-20 DIAGNOSIS — Z5320 Procedure and treatment not carried out because of patient's decision for unspecified reasons: Secondary | ICD-10-CM | POA: Insufficient documentation

## 2012-06-20 NOTE — ED Notes (Signed)
Triage note: This patient was seen briefly by me in triage she states she wants to go home.  She has not had labs or any workup yet, has been in waiting area due to high volume of ED today.  She understands she has not had a full evaluation, has not had labs, is dangerous to go home. She understands these risks. She wishes to leave states she feels improved, will f/u with her PMD.  Note Regarding AMA Discharge:  [facility] chooses to leave the emergency department against medical advice.  The patient is alert, speaks normally, walks normally and has a normal level of consciousness.  As a result, the patient is clinically competent to make this decision. The patient has been made aware of the consequences of leaving against medical advice.  These consequences include, but are not limited to, a worsening clinical condition and death.  I have arranged outpatient treatment and follow-up to the best of my ability.  The patient verbalized understanding of this and agrees to be discharged against medical advice.  The patient understands they may return to the ED at any time for another evaluation.    Oneal Deputy, MD  10:05 PM       Oneal Deputy, MD  06/20/12 [phone]

## 2012-06-20 NOTE — ED Notes (Signed)
Pt is BIBA c/o sudden onset of dizziness and light headedness while walking in store. Ambulated for ems short distances. dexi 275 which pt states is normal for her. -cp, -sob,

## 2012-08-11 ENCOUNTER — Emergency Department
Admission: EM | Admit: 2012-08-11 | Discharge: 2012-08-11 | Disposition: A | Discharge status: Home / Self Care | Payer: Medicare Other | Attending: Emergency Medicine | Admitting: Emergency Medicine

## 2012-08-11 ENCOUNTER — Emergency Department: Payer: Medicare Other

## 2012-08-11 DIAGNOSIS — I10 Essential (primary) hypertension: Secondary | ICD-10-CM | POA: Insufficient documentation

## 2012-08-11 DIAGNOSIS — M25539 Pain in unspecified wrist: Secondary | ICD-10-CM | POA: Insufficient documentation

## 2012-08-11 DIAGNOSIS — M25531 Pain in right wrist: Secondary | ICD-10-CM

## 2012-08-11 DIAGNOSIS — E119 Type 2 diabetes mellitus without complications: Secondary | ICD-10-CM | POA: Insufficient documentation

## 2012-08-11 MED ORDER — IBUPROFEN 600 MG PO TABS
600.00 mg | ORAL_TABLET | Freq: Four times a day (QID) | ORAL | Status: DC | PRN
Start: 2012-08-11 — End: 2012-09-13

## 2012-08-11 NOTE — Discharge Instructions (Signed)
1. Return immediately if worse in any way.    2. Follow up with your primary medical doctor and hand surgery for further evaluation.

## 2012-08-11 NOTE — ED Notes (Signed)
Right wrist and thumb pain 1 hr ago no injury

## 2012-08-13 NOTE — ED Provider Notes (Signed)
EMERGENCY DEPARTMENT NOTE    Physician/Midlevel provider first contact with patient: 08/11/12 2016         HISTORY OF PRESENT ILLNESS   Historian:Patient  Translator Used: No    37 y.o. female presents with right wrist pain radiating to her right thumb for the last few hours.  Patient does not believe she sustained any trauma but seems somewhat unsure.  No numbness, weakness, tingling.  No fever/chills. Patient denies any other complaint. No other joint pain.    1. Location of symptoms:right wrist  2. Onset of symptoms:2 hours  3. What was patient doing when symptoms started (Context):at rest  4. Severity:moderate  5. Timing: constant  6. Activities that worsen symptoms:movement  7. Activities that improve symptoms: rest  8. Quality: ache  9. Radiation of symptoms:to right thumb  10. Associated signs and Symptoms:see above  11. Are symptoms worsening?yes  MEDICAL HISTORY     Past Medical History:  Past Medical History   Diagnosis Date   . Diabetes mellitus without complication    . Asthma without status asthmaticus        Past Surgical History:  Past Surgical History   Procedure Date   . Cesarean section    . Cholecystectomy    . Tonsillectomy    . Hernia repair        Social History:  History     Social History   . Marital Status: Single     Spouse Name: N/A     Number of Children: N/A   . Years of Education: N/A     Occupational History   . Not on file.     Social History Main Topics   . Smoking status: Never Smoker    . Smokeless tobacco: Not on file   . Alcohol Use: No   . Drug Use: No   . Sexually Active: Not on file     Other Topics Concern   . Not on file     Social History Narrative   . No narrative on file       Family History:  History reviewed. No pertinent family history.    Outpatient Medication:  Previous Medications    LISINOPRIL (PRINIVIL,ZESTRIL) 10 MG TABLET    Take 10 mg by mouth daily.    METFORMIN (GLUCOPHAGE) 1000 MG TABLET    Take 1,000 mg by mouth 2 (two) times daily with meals.    PROAIR HFA  108 (90 BASE) MCG/ACT INHALER        QVAR 80 MCG/ACT INHALER             REVIEW OF SYSTEMS   Review of Systems   Constitutional: Negative for fever and chills.   HENT: Negative for neck pain.    Respiratory: Negative for cough and shortness of breath.    Cardiovascular: Negative for chest pain and orthopnea.   Musculoskeletal: Negative for back pain.   Neurological: Negative for tremors, sensory change, speech change, focal weakness, loss of consciousness and headaches.       PHYSICAL EXAM   ED Triage Vitals   Enc Vitals Group      BP 08/11/12 2004 134/75 mmHg      Heart Rate 08/11/12 2004 93       Resp Rate 08/11/12 2004 18       Temp 08/11/12 2004 98.4 F (36.9 C)      Temp Source 08/11/12 2004 Oral      SpO2 08/11/12 2004 98 %  Weight --       Height --       Head Cir --       Peak Flow --       Pain Score 08/11/12 2004 8      Pain Loc --       Pain Edu? --       Excl. in GC? --    Nursing note and vitals reviewed.  Constitutional:  Well developed, well nourished.  Awake & alert.    Head:  Atraumatic.  Normocephalic.  .  Neck:  Supple.  No JVD. No c-spine ttp or bony deformity  Pulmonary/Chest:  No evidence of respiratory distress.      Back:  No CVA tenderness. No TL spine ttp or bony deformity  Extremities:  2+ DP/PT/radial pulses b/l extremities.  Compartment soft b/l UE and LE.  Brisk cap refill b/l upper and lower extremities.  B/l UE and LE normal except there is pain with passive ROM of the right thumb. No swelling.  No snuff box ttp.  Full ROM all joints.   Skin:  Skin is warm and dry.  No diaphoresis.   Neurological:  Alert, awake, and appropriate.  Normal speech.  Normal motor and sensory b/l UE and LE.  Psychiatric:  Good eye contact.  Normal interaction, affect, and behavior        MEDICAL DECISION MAKING     DISCUSSION    Xray right hand and wrist negative. Velcro thumb spica splint placed. Will refer to Dr. Toney Rakes (hand surgery) for recheck and further evaluation return if worse.  Patient  understands and agrees with plan.    Vital Signs: Reviewed the patient?s vital signs.   Nursing Notes: Reviewed and utilized available nursing notes.  Medical Records Reviewed: Reviewed available past medical records.  Counseling: The emergency provider has spoken with the patient and discussed today?s findings, in addition to providing specific details for the plan of care.  Questions are answered and there is agreement with the plan.      IMAGING STUDIES    The following imaging studies were independently interpreted by the Emergency Medicine Physician.  For full imaging study results please see chart.  Radiology:  Exam interpreted by Bethann Berkshire MD (ED Physician)  Exam:right hand  Findings: No fracture. No Dislocation. No foreign body.  Impression: No acute abnormality.     Radiology:  Exam interpreted by Bethann Berkshire MD (ED Physician)  Exam:right wrist  Findings: No fracture. No Dislocation. No foreign body.  Impression: No acute abnormality.       PULSE OXIMETRY    Oxygen Saturation by Pulse Oximetry: 99%  Interventions: none  Interpretation: normal    EMERGENCY DEPT. MEDICATIONS      ED Medication Orders     None        CRITICAL CARE/PROCEDURES    Splint Application  Performed by: Bethann Berkshire D  Authorized by: Bethann Berkshire D  Consent: Verbal consent obtained.  Risks and benefits: risks, benefits and alternatives were discussed  Consent given by: patient  Location details: right wrist  Splint type: thumb spica  Supplies used: velcro wrist thumb spica splint.  Post-procedure: The splinted body part was neurovascularly unchanged following the procedure.  Patient tolerance: Patient tolerated the procedure well with no immediate complications.  Comments: Splint placed by tech.  I was present for key portions of procedure.  Patient NVI after splint placed.         DIAGNOSIS  Diagnosis:  Final diagnoses:   Wrist pain, acute, right       Disposition:  ED Disposition     Discharge Allegra Grana  discharge to home/self care.    Condition at discharge: Stable            Prescriptions:  Patient's Medications   New Prescriptions    IBUPROFEN (ADVIL,MOTRIN) 600 MG TABLET    Take 1 tablet (600 mg total) by mouth every 6 (six) hours as needed for Pain or Fever.   Previous Medications    LISINOPRIL (PRINIVIL,ZESTRIL) 10 MG TABLET    Take 10 mg by mouth daily.    METFORMIN (GLUCOPHAGE) 1000 MG TABLET    Take 1,000 mg by mouth 2 (two) times daily with meals.    PROAIR HFA 108 (90 BASE) MCG/ACT INHALER        QVAR 80 MCG/ACT INHALER       Modified Medications    No medications on file   Discontinued Medications    No medications on file           Shela Nevin, MD  08/13/12 1708

## 2012-08-14 ENCOUNTER — Emergency Department: Payer: Medicare Other

## 2012-08-14 ENCOUNTER — Emergency Department
Admission: EM | Admit: 2012-08-14 | Discharge: 2012-08-14 | Disposition: A | Discharge status: Home / Self Care | Payer: Medicare Other | Attending: Emergency Medicine | Admitting: Emergency Medicine

## 2012-08-14 DIAGNOSIS — R55 Syncope and collapse: Secondary | ICD-10-CM | POA: Insufficient documentation

## 2012-08-14 DIAGNOSIS — E119 Type 2 diabetes mellitus without complications: Secondary | ICD-10-CM | POA: Insufficient documentation

## 2012-08-14 DIAGNOSIS — E860 Dehydration: Secondary | ICD-10-CM | POA: Insufficient documentation

## 2012-08-14 DIAGNOSIS — I10 Essential (primary) hypertension: Secondary | ICD-10-CM | POA: Insufficient documentation

## 2012-08-14 HISTORY — DX: Anemia, unspecified: D64.9

## 2012-08-14 LAB — POCT URINALYSIS AUTOMATED (IAH)
Bilirubin, UA POCT: NEGATIVE
Glucose, UA POCT: 500 — AB
Ketones, UA POCT: 40 mg/dL — AB
Nitrite, UA POCT: NEGATIVE
PH, UA POCT: 5.5 (ref 4.6–8)
Protein, UA POCT: NEGATIVE mg/dL
Specific Gravity, UA POCT: 1.015 mg/dL (ref 1.001–1.035)
Urobilinogen, UA POCT: 0.2 mg/dL

## 2012-08-14 LAB — CBC AND DIFFERENTIAL
Basophils Absolute Automated: 0.02 (ref 0.00–0.20)
Basophils Automated: 0 %
Eosinophils Absolute Automated: 0.17 (ref 0.00–0.70)
Eosinophils Automated: 2 %
Hematocrit: 35.6 % — ABNORMAL LOW (ref 37.0–47.0)
Hgb: 12.3 g/dL (ref 12.0–16.0)
Immature Granulocytes Absolute: 0.02
Immature Granulocytes: 0 %
Lymphocytes Absolute Automated: 2.73 (ref 0.50–4.40)
Lymphocytes Automated: 30 %
MCH: 26.9 pg — ABNORMAL LOW (ref 28.0–32.0)
MCHC: 34.6 g/dL (ref 32.0–36.0)
MCV: 77.9 fL — ABNORMAL LOW (ref 80.0–100.0)
MPV: 9.9 fL (ref 9.4–12.3)
Monocytes Absolute Automated: 0.43 (ref 0.00–1.20)
Monocytes: 5 %
Neutrophils Absolute: 5.82 (ref 1.80–8.10)
Neutrophils: 63 %
Nucleated RBC: 0 (ref 0–1)
Platelets: 360 (ref 140–400)
RBC: 4.57 (ref 4.20–5.40)
RDW: 14 % (ref 12–15)
WBC: 9.17 (ref 3.50–10.80)

## 2012-08-14 LAB — POCT PREGNANCY TEST, URINE HCG: POCT Pregnancy HCG Test, UR: NEGATIVE

## 2012-08-14 MED ORDER — DIPHENHYDRAMINE HCL 50 MG/ML IJ SOLN
25.0000 mg | Freq: Once | INTRAMUSCULAR | Status: AC
Start: 2012-08-14 — End: 2012-08-14
  Administered 2012-08-14: 25 mg via INTRAVENOUS
  Filled 2012-08-14: qty 1

## 2012-08-14 MED ORDER — SODIUM CHLORIDE 0.9 % IV BOLUS
500.0000 mL | Freq: Once | INTRAVENOUS | Status: AC
Start: 2012-08-14 — End: 2012-08-14
  Administered 2012-08-14: 500 mL via INTRAVENOUS

## 2012-08-14 MED ORDER — PROCHLORPERAZINE EDISYLATE 5 MG/ML IJ SOLN
10.00 mg | Freq: Once | INTRAMUSCULAR | Status: AC
Start: 2012-08-14 — End: 2012-08-14
  Administered 2012-08-14: 10 mg via INTRAVENOUS
  Filled 2012-08-14: qty 2

## 2012-08-14 MED ORDER — PROCHLORPERAZINE MALEATE 10 MG PO TABS
10.0000 mg | ORAL_TABLET | Freq: Four times a day (QID) | ORAL | Status: DC | PRN
Start: 2012-08-14 — End: 2012-09-13

## 2012-08-14 NOTE — ED Notes (Signed)
[place] pt c/o lightheadedness/weakness, denies LOC, c/o burning with urination for one month, denies CP/SOB, denies N/V, c/o frontal headache, dexi [phone]

## 2012-08-14 NOTE — ED Provider Notes (Signed)
EMERGENCY DEPARTMENT HISTORY AND PHYSICAL EXAM     Physician/Midlevel provider first contact with patient: 08/14/12 1555         Date: 08/14/2012  Patient Name: Dorothy [name]    History of Presenting Illness     Chief Complaint   Patient presents with   . Dizziness   . Fatigue       History Provided By: Patient    Chief Complaint: Dizziness  Onset: 3 hours ago  Timing: Resolved  Location: n/a  Quality: near-LOC  Severity: Moderate  Modifying Factors: None  Associated Symptoms: Nausea, HA, blurred vision, dysuria, urine frequency    Additional History: Dorothy [name] is a 37 y.o. female with hx of DM, HTN, and anemia presenting to ED via EMS for an episode of dizziness and generalized weakness 3 hours ago that is currently improved. [place] Pt states she had blurred vision and near-LOC during this episode. Pt now reports nausea, mild HA, dysuria, and frequency. Denies CP, SOB, and vomiting.     Pt states LNMP was 4/08.    PCP: Mariana Single, MD      Current Facility-Administered Medications   Medication Dose Route Frequency Provider Last Rate Last Dose   . diphenhydrAMINE (BENADRYL) injection 25 mg  25 mg Intravenous Once Roxine Caddy, MD       . prochlorperazine (COMPAZINE) injection 10 mg  10 mg Intravenous Once Roxine Caddy, MD       . [COMPLETED] sodium chloride 0.9 % bolus 500 mL  500 mL Intravenous Once Roxine Caddy, MD   500 mL at 08/14/12 1621     Current Outpatient Prescriptions   Medication Sig Dispense Refill   . ibuprofen (ADVIL,MOTRIN) 600 MG tablet Take 1 tablet (600 mg total) by mouth every 6 (six) hours as needed for Pain or Fever.  20 tablet  0   . lisinopril (PRINIVIL,ZESTRIL) 10 mg tablet Take 10 mg by mouth daily.       . metFORMIN (GLUCOPHAGE) 1000 MG tablet Take 1,000 mg by mouth 2 (two) times daily with meals.       Marland Kitchen PROAIR HFA 108 (90 BASE) MCG/ACT inhaler        .  prochlorperazine (COMPAZINE) 10 MG tablet Take 1 tablet (10 mg total) by mouth every 6 (six) hours as needed (headache or nausea).  12 tablet  0   . QVAR 80 MCG/ACT inhaler            Past History     Past Medical History:  Past Medical History   Diagnosis Date   . Diabetes mellitus without complication    . Asthma without status asthmaticus    . Anemia    . Hypertensive disorder        Past Surgical History:  Past Surgical History   Procedure Date   . Cesarean section    . Cholecystectomy    . Tonsillectomy    . Hernia repair        Family History:  No family history on file.    Social History:  History   Substance Use Topics   . Smoking status: Never Smoker    . Smokeless tobacco: Not on file   . Alcohol Use: No       Allergies:  Allergies   Allergen Reactions   . Percocet (Oxycodone-Acetaminophen)  Unknown reaction         Review of Systems     ROS  Constitutional: Negative for fever or chills.   Neurological: (+) Dizziness. Negative for speech changes, weakness, or numbness.  Eyes: (+) Blurred vision. Negative for eye pain.  HENT: No headache. Negative for sore throat, neck pain, or runny nose.   Cardiovascular: Negative for chest pain.   Respiratory: Negative for shortness of breath.   Gastrointestinal: (+) Nausea. Negative for abdominal pain, vomiting, diarrhea, or blood in stool.   Genitourinary: (+) Dysuria. (+) Urine frequency. Negative for hematuria.  Musculoskeletal: Negative for gait changes, joint pain or muscle pain.   Skin: Negative for itching or rash.   Hematological: Negative for easy bruising    Physical Exam   BP 123/61  Pulse 87  Temp 98 F (36.7 C)  Resp 18  SpO2 100%  LMP 07/31/2012    Physical Exam   Constitutional: Patient is oriented to person, place, and time and well-developed, well-nourished, and in no distress.   Head: Normocephalic and atraumatic.   ENT/Eyes: EOM are normal. Pupils are equal, round, and reactive to light. MMM.  Neck: Normal range of motion. Neck supple.    Cardiovascular: Normal rate and regular rhythm.   Pulmonary/Chest: Effort normal and breath sounds normal. No respiratory distress.   Abdominal: Soft. There is no tenderness. No rebound or guarding.  Musculoskeletal: Normal range of motion. No lower extremity edema.  Neurological: Patient is alert and oriented to person, place, and time. No gross weakness.   Skin: Skin is warm and dry.       Diagnostic Study Results     Labs -     Results     Procedure Component Value Units Date/Time    CBC and differential [[id number]]  (Abnormal) Collected:08/14/12 1617    Specimen Information:Blood / Blood Updated:08/14/12 1631     WBC 9.17      RBC 4.57      Hgb 12.3 g/dL      Hematocrit 47.0 (L) %      MCV 77.9 (L) fL      MCH 26.9 (L) pg      MCHC 34.6 g/dL      RDW 14 %      Platelets 360      MPV 9.9 fL      Neutrophils 63 %      Lymphocytes Automated 30 %      Monocytes 5 %      Eosinophils Automated 2 %      Basophils Automated 0 %      Immature Granulocyte 0 %      Nucleated RBC 0      Neutrophils Absolute 5.82      Abs Lymph Automated 2.73      Abs Mono Automated 0.43      Abs Eos Automated 0.17      Absolute Baso Automated 0.02      Absolute Immature Granulocyte 0.02     UA POC (POCT UA Clinitek AX) [[id number]]  (Abnormal) Collected:08/14/12 1621     Color UA POCT Light Yellow Updated:08/14/12 1627     Clarity UA POCT Cloudy      Glucose, UA POCT =500 (A)      Bilirubin, UA POCT Negative      Ketones, UA POCT =40 (A) mg/dL      Specific Gravity, UA POCT 1.015 mg/dL      Blood, UA POCT  Trace - intact (A)  PH, UA POCT 5.5      Protein, UA POCT Negative mg/dL      Urobilinogen, UA POCT 0.2 mg/dL      Nitrite, UA POCT Negative      Leukocytes, UA POCT Trace  (A)     Urine HCG POC [[id number]] Collected:08/14/12 1615     POCT QC Pass Updated:08/14/12 1620     POCT Pregnancy HCG Test, UR Negative      Comment:        Result:     Negative Value is Normal in Healthy Males or Healthy non-pregnant Females          Radiologic  Studies -   Radiology Results (24 Hour)     ** No Results found for the last 24 hours. **      .      Medical Decision Making   I am the first provider for this patient.    I reviewed the vital signs, available nursing notes, past medical history, past surgical history, family history and social history.    Vital Signs-Reviewed the patient's vital signs.     Patient Vitals for the past 12 hrs:   BP Temp Pulse Resp   08/14/12 1620 123/61 mmHg - 87  18    08/14/12 1401 130/71 mmHg 98 F (36.7 C) 89  18        Pulse Oximetry Analysis - Normal 98% on RA    EKG:  Interpreted by the EP.   Time Interpreted: 4:02 PM   Rate: 93 bpm   Rhythm: Normal Sinus Rhythm   Interpretation: Normal axis. No ST changes    Old Medical Records: Nursing notes.     ED Course: 5:37 PM  Pt is feeling better and would like to go home. Is c/o mild HA. Discussed test results with pt and counseled on diagnosis, f/u plans, and signs and symptoms when to return to ED.  Pt is stable and ready for discharge after HA medications given.      Provider Notes: 23F presented with a near syncopal episode and moderate HA. EKG benign. Rehydrated with IVF. UA showed no UTI. Hct mildly low consistent with previous anemia. HA treated. No red flags for ICH or meningitis. Improved on re-evaluation. Discussed importance of f/u and indications for return. Rx'd compazine.       Core Measures:      Diagnosis     Clinical Impression:   1. Near syncope    2. Dehydration        _______________________________    Attestations:  This note is prepared by Bonnita Nasuti, acting as Scribe for Burgess Estelle, MD.    Burgess Estelle:  The scribe's documentation has been prepared under my direction and personally reviewed by me in its entirety.  I confirm that the note above accurately reflects all work, treatment, procedures, and medical decision making performed by me.    _______________________________        Roxine Caddy, MD  08/19/12 0225

## 2012-08-14 NOTE — Discharge Instructions (Signed)
Near Syncope    You have been diagnosed with a "Near-Fainting" spell, also called "Near-Syncope."    Syncope is the medical term for "loss of consciousness," also known as a fainting spell or simply fainting. "Near-Syncope" means almost fainting. Some of the same things that cause a complete faint can cause an "almost-faint."    There are many reasons for fainting episodes. Some fainting episodes can be from life-threatening causes, while others can be from non-serious causes. Most patients with life-threatening causes are admitted to the hospital for further testing. Patients thought to have non-life threatening causes may be discharged home.    Some non-life threatening causes of near-syncope include dehydration, heat exhaustion, vasovagal events (simple faint), and new medication side-effects.    Treatment of syncope depends on the cause. In general it is a good idea to drink plenty of fluids and avoid strenuous activity for at least 24 hours after a near-fainting episode.    You should follow up with your primary care doctor for a recheck.    YOU SHOULD SEEK MEDICAL ATTENTION IMMEDIATELY, EITHER HERE OR AT THE NEAREST EMERGENCY DEPARTMENT, IF ANY OF THE FOLLOWING OCCURS:   You have recurrent episodes of fainting.   You have any chest pain with your fainting.   You notice any palpitations or strange heart beats before fainting.   You notice any blood in your stools.   You are feeling weak, lightheaded, or not improving as expected.   You develop any other worsening symptoms or concerns.    Dehydration    You have been diagnosed with dehydration.    Dehydration occurs when your body is low on fluids. Dehydration can come from a variety of causes ranging from vomiting and diarrhea, to excessive sweating and poor appetite.    Your doctor feels that you do not need IV fluids at this time. At home it is important that you drink plenty of fluids on a frequent basis. Try fluids that won t upset your  stomach including water, juice or drinks like Gatorade. Avoid beverages like soda pop and tea as they may make you worse. Avoid caffeine and alcohol as these may cause you to lose more fluid.    YOU SHOULD SEEK MEDICAL ATTENTION IMMEDIATELY, EITHER HERE OR AT THE NEAREST EMERGENCY DEPARTMENT, IF ANY OF THE FOLLOWING OCCURS:   Fever (temperature of over 100.5 F.) or shaking chills.   Persistent vomiting and/or diarrhea.   Lightheadedness or fainting.   If you do not urinate for 8 or more hours.

## 2012-08-14 NOTE — ED Notes (Signed)
ZOX:[id number]<AV> Expected date:08/14/12<BR> Expected time: 1:49 PM<BR> Means of arrival:[id number] - [place]<BR> Comments:<BR>

## 2012-08-15 LAB — ECG 12-LEAD
Atrial Rate: 93 {beats}/min
P Axis: 49 degrees
P-R Interval: 144 ms
Q-T Interval: 368 ms
QRS Duration: 94 ms
QTC Calculation (Bezet): 457 ms
R Axis: 6 degrees
T Axis: 15 degrees
Ventricular Rate: 93 {beats}/min

## 2012-08-29 ENCOUNTER — Emergency Department: Payer: Medicare Other

## 2012-08-29 ENCOUNTER — Emergency Department
Admission: EM | Admit: 2012-08-29 | Discharge: 2012-08-29 | Disposition: A | Discharge status: Home / Self Care | Payer: Medicare Other | Attending: Nurse Practitioner | Admitting: Nurse Practitioner

## 2012-08-29 DIAGNOSIS — E119 Type 2 diabetes mellitus without complications: Secondary | ICD-10-CM | POA: Insufficient documentation

## 2012-08-29 DIAGNOSIS — I10 Essential (primary) hypertension: Secondary | ICD-10-CM | POA: Insufficient documentation

## 2012-08-29 DIAGNOSIS — Z862 Personal history of diseases of the blood and blood-forming organs and certain disorders involving the immune mechanism: Secondary | ICD-10-CM | POA: Insufficient documentation

## 2012-08-29 DIAGNOSIS — Y990 Civilian activity done for income or pay: Secondary | ICD-10-CM | POA: Insufficient documentation

## 2012-08-29 DIAGNOSIS — S60229A Contusion of unspecified hand, initial encounter: Secondary | ICD-10-CM | POA: Insufficient documentation

## 2012-08-29 DIAGNOSIS — Y929 Unspecified place or not applicable: Secondary | ICD-10-CM | POA: Insufficient documentation

## 2012-08-29 DIAGNOSIS — J45909 Unspecified asthma, uncomplicated: Secondary | ICD-10-CM | POA: Insufficient documentation

## 2012-08-29 DIAGNOSIS — W010XXA Fall on same level from slipping, tripping and stumbling without subsequent striking against object, initial encounter: Secondary | ICD-10-CM | POA: Insufficient documentation

## 2012-08-29 DIAGNOSIS — Y939 Activity, unspecified: Secondary | ICD-10-CM | POA: Insufficient documentation

## 2012-08-29 DIAGNOSIS — S60222A Contusion of left hand, initial encounter: Secondary | ICD-10-CM

## 2012-08-29 MED ORDER — ACETAMINOPHEN 500 MG PO TABS
1000.0000 mg | ORAL_TABLET | Freq: Once | ORAL | Status: AC
Start: 2012-08-29 — End: 2012-08-29
  Administered 2012-08-29: 1000 mg via ORAL
  Filled 2012-08-29: qty 2

## 2012-08-29 NOTE — ED Provider Notes (Signed)
EMERGENCY DEPARTMENT NOTE    Physician/Midlevel provider first contact with patient: 08/29/12 1030         HISTORY OF PRESENT ILLNESS   Historian:Patient  Translator Used: No    37 y.o. female     1. Location of symptoms:left 4th and 5th finger  2. Onset of symptoms:yesterday  3. What was patient doing when symptoms started (Context):slipped and fell on wet floor at work, no LOC, no head trauma, left 4 and 5th digit pain, no deformity  4. Severity:mild  5. Timing: intermittent  6. Activities that worsen symptoms:movement  7. Activities that improve symptoms: rest  8. Quality: ache  9. Radiation of symptoms:none  10. Associated signs and Symptoms:none  11. Are symptoms worsening?no  MEDICAL HISTORY     Past Medical History:  Past Medical History   Diagnosis Date   . Diabetes mellitus without complication    . Asthma without status asthmaticus    . Anemia    . Hypertensive disorder        Past Surgical History:  Past Surgical History   Procedure Date   . Cesarean section    . Cholecystectomy    . Tonsillectomy    . Hernia repair        Social History:  History     Social History   . Marital Status: Single     Spouse Name: N/A     Number of Children: N/A   . Years of Education: N/A     Occupational History   . Not on file.     Social History Main Topics   . Smoking status: Never Smoker    . Smokeless tobacco: Not on file   . Alcohol Use: No   . Drug Use: No   . Sexually Active: Not on file     Other Topics Concern   . Not on file     Social History Narrative   . No narrative on file       Family History:  No family history on file.    Outpatient Medication:  Previous Medications    IBUPROFEN (ADVIL,MOTRIN) 600 MG TABLET    Take 1 tablet (600 mg total) by mouth every 6 (six) hours as needed for Pain or Fever.    LISINOPRIL (PRINIVIL,ZESTRIL) 10 MG TABLET    Take 10 mg by mouth daily.    METFORMIN (GLUCOPHAGE) 1000 MG TABLET    Take 1,000 mg by mouth 2 (two) times daily with meals.    PROAIR HFA 108 (90 BASE) MCG/ACT  INHALER        PROCHLORPERAZINE (COMPAZINE) 10 MG TABLET    Take 1 tablet (10 mg total) by mouth every 6 (six) hours as needed (headache or nausea).    QVAR 80 MCG/ACT INHALER             REVIEW OF SYSTEMS   Review of Systems   Constitutional: Negative for fever, chills and diaphoresis.   HENT: Negative for congestion and sore throat.    Eyes: Negative for pain, discharge and redness.   Respiratory: Negative for cough, shortness of breath and wheezing.    Cardiovascular: Negative for chest pain, leg swelling and PND.   Gastrointestinal: Negative for nausea, vomiting and abdominal pain.   Genitourinary: Negative for dysuria.   Musculoskeletal: Positive for joint pain. Negative for back pain.   Skin: Negative for rash.   Neurological: Negative for dizziness, tingling, seizures, loss of consciousness, weakness and headaches.   All other systems  reviewed and are negative.        PHYSICAL EXAM   ED Triage Vitals   Enc Vitals Group      BP 08/29/12 1038 158/78 mmHg      Heart Rate 08/29/12 1038 87       Resp Rate 08/29/12 1038 18       Temp --       Temp src --       SpO2 08/29/12 1038 100 %      Weight --       Height --       Head Cir --       Peak Flow --       Pain Score --       Pain Loc --       Pain Edu? --       Excl. in GC? --      Nursing note and vitals reviewed.  Constitutional:  Well developed, well nourished. Awake & Oriented x3.  Head:  Atraumatic. Normocephalic.    Eyes:  PERRL. EOMI. Conjunctivae are not pale.  ENT:  Mucous membranes are moist and intact. Oropharynx is clear and symmetric.  Patent airway.  Neck:  Supple. Full ROM.    Cardiovascular:  Regular rate. Regular rhythm. No murmurs, rubs, or gallops.  Pulmonary/Chest:  No evidence of respiratory distress. Clear to auscultation bilaterally.  No wheezing, rales or rhonchi.   Abdominal:  Soft and non-distended. There is no tenderness. No rebound, guarding, or rigidity.  Back:  Full ROM. Nontender.  Extremities:  No edema. No cyanosis. No clubbing.  Full range of motion in all extremities. Left hand no obvious deformity, no edema, erythema, ecchymosis noted, ttp to 5th metacarpal,ttp to proximal phalanx of left 4th and 5th digit, ROM restricted due to pain, brisk cap refill, sensation intact.  Skin:  Skin is warm and dry.  No diaphoresis. No rash.   Neurological:  Alert, awake, and appropriate. Normal speech. Motor normal.  Psychiatric:  Good eye contact. Normal interaction, affect, and behavior.        MEDICAL DECISION MAKING     DISCUSSION      Vital Signs: Reviewed the patient?s vital signs.   Nursing Notes: Reviewed and utilized available nursing notes.  Medical Records Reviewed: Reviewed available past medical records.  Counseling: The emergency provider has spoken with the patient and discussed today?s findings, in addition to providing specific details for the plan of care.  Questions are answered and there is agreement with the plan.          IMAGING STUDIES    The following imaging studies were independently interpreted by the Emergency Medicine Physician.  For full imaging study results please see chart.        PULSE OXIMETRY    Oxygen Saturation by Pulse Oximetry: 99%  Interventions:   Interpretation:     EMERGENCY DEPT. MEDICATIONS      ED Medication Orders      Start     Status Ordering Provider    08/29/12 1101   acetaminophen (TYLENOL) tablet 1,000 mg   Once      Route: Oral  Ordered Dose: 1,000 mg         Last MAR action:  Given Corabelle Spackman PAVLOVNA                LABORATORY RESULTS    Ordered and independently interpreted AVAILABLE laboratory tests. Please see results section in chart for full details.  Results for orders placed during the hospital encounter of 08/14/12   CBC AND DIFFERENTIAL       Component Value Range    WBC 9.17  3.50 - 10.80    RBC 4.57  4.20 - 5.40    Hgb 12.3  12.0 - 16.0 g/dL    Hematocrit 42.5 (*) 37.0 - 47.0 %    MCV 77.9 (*) 80.0 - 100.0 fL    MCH 26.9 (*) 28.0 - 32.0 pg    MCHC 34.6  32.0 - 36.0 g/dL    RDW 14   12 - 15 %    Platelets 360  140 - 400    MPV 9.9  9.4 - 12.3 fL    Neutrophils 63  None %    Lymphocytes Automated 30  None %    Monocytes 5  None %    Eosinophils Automated 2  None %    Basophils Automated 0  None %    Immature Granulocyte 0  None %    Nucleated RBC 0  0 - 1    Neutrophils Absolute 5.82  1.80 - 8.10    Abs Lymph Automated 2.73  0.50 - 4.40    Abs Mono Automated 0.43  0.00 - 1.20    Abs Eos Automated 0.17  0.00 - 0.70    Absolute Baso Automated 0.02  0.00 - 0.20    Absolute Immature Granulocyte 0.02  0   POCT PREGNANCY TEST, URINE HCG       Component Value Range    POCT QC Pass      POCT Pregnancy HCG Test, UR Negative  Negative    Comment:        Value: Negative Value is Normal in Healthy Males or Healthy non-pregnant Females   ECG 12-LEAD       Component Value Range    Ventricular Rate 93      Atrial Rate 93      P-R Interval 144      QRS Duration 94      Q-T Interval 368      QTC Calculation (Bezet) 457      P Axis 49      R Axis 6      T Axis 15     POCT URINALYSIS AUTOMATED (IA[facility])       Component Value Range    Color UA POCT Light Yellow      Clarity UA POCT Cloudy      Glucose, UA POCT =500 (*) Negative    Bilirubin, UA POCT Negative  Negative    Ketones, UA POCT =40 (*) Negative mg/dL    Specific Gravity, UA POCT 1.015  1.001 - 1.035 mg/dL    Blood, UA POCT  Trace - intact (*) Negative    PH, UA POCT 5.5  4.6 - 8    Protein, UA POCT Negative  Negative mg/dL    Urobilinogen, UA POCT 0.2  0.2, 1.0 mg/dL    Nitrite, UA POCT Negative  Negative    Leukocytes, UA POCT Trace  (*) Negative           DIAGNOSIS      Diagnosis:  Final diagnoses:   Contusion of left hand, initial encounter       Disposition:  ED Disposition     Discharge Dorothy [name] discharge to home/self care.    Condition at discharge: Stable            Prescriptions:  Patient's  Medications   New Prescriptions    No medications on file   Previous Medications    IBUPROFEN (ADVIL,MOTRIN) 600 MG TABLET    Take 1 tablet (600 mg  total) by mouth every 6 (six) hours as needed for Pain or Fever.    LISINOPRIL (PRINIVIL,ZESTRIL) 10 MG TABLET    Take 10 mg by mouth daily.    METFORMIN (GLUCOPHAGE) 1000 MG TABLET    Take 1,000 mg by mouth 2 (two) times daily with meals.    PROAIR HFA 108 (90 BASE) MCG/ACT INHALER        PROCHLORPERAZINE (COMPAZINE) 10 MG TABLET    Take 1 tablet (10 mg total) by mouth every 6 (six) hours as needed (headache or nausea).    QVAR 80 MCG/ACT INHALER       Modified Medications    No medications on file   Discontinued Medications    No medications on file           Kameron Glazebrook, Samara Deist, NP  08/29/12 1540

## 2012-08-29 NOTE — ED Notes (Signed)
Pt reports slipping at work on soap and water. Pt has pain to left hand 4th and 5th digits and anterior palm. Pt is alert and oriented x3, nad. +pms <2 cap refill.

## 2012-09-13 ENCOUNTER — Emergency Department: Payer: Medicare Other

## 2012-09-13 ENCOUNTER — Emergency Department
Admission: EM | Admit: 2012-09-13 | Discharge: 2012-09-13 | Disposition: A | Discharge status: Home / Self Care | Payer: Medicare Other | Attending: Physician Assistant | Admitting: Physician Assistant

## 2012-09-13 DIAGNOSIS — Y92410 Unspecified street and highway as the place of occurrence of the external cause: Secondary | ICD-10-CM | POA: Insufficient documentation

## 2012-09-13 DIAGNOSIS — D649 Anemia, unspecified: Secondary | ICD-10-CM | POA: Insufficient documentation

## 2012-09-13 DIAGNOSIS — L03019 Cellulitis of unspecified finger: Secondary | ICD-10-CM | POA: Insufficient documentation

## 2012-09-13 DIAGNOSIS — S335XXA Sprain of ligaments of lumbar spine, initial encounter: Secondary | ICD-10-CM | POA: Insufficient documentation

## 2012-09-13 DIAGNOSIS — E119 Type 2 diabetes mellitus without complications: Secondary | ICD-10-CM | POA: Insufficient documentation

## 2012-09-13 DIAGNOSIS — J45909 Unspecified asthma, uncomplicated: Secondary | ICD-10-CM | POA: Insufficient documentation

## 2012-09-13 MED ORDER — CEPHALEXIN 500 MG PO CAPS
500.0000 mg | ORAL_CAPSULE | Freq: Four times a day (QID) | ORAL | Status: AC
Start: 2012-09-13 — End: 2012-09-23

## 2012-09-13 MED ORDER — IBUPROFEN 400 MG PO TABS
800.0000 mg | ORAL_TABLET | Freq: Once | ORAL | Status: AC
Start: 2012-09-13 — End: 2012-09-13
  Administered 2012-09-13: 800 mg via ORAL
  Filled 2012-09-13: qty 2

## 2012-09-13 MED ORDER — IBUPROFEN 800 MG PO TABS
800.0000 mg | ORAL_TABLET | Freq: Three times a day (TID) | ORAL | Status: AC | PRN
Start: 2012-09-13 — End: 2012-09-23

## 2012-09-13 MED ORDER — HYDROCODONE-ACETAMINOPHEN 5-325 MG PO TABS
1.0000 | ORAL_TABLET | Freq: Once | ORAL | Status: AC
Start: 2012-09-13 — End: 2012-09-13
  Administered 2012-09-13: 1 via ORAL
  Filled 2012-09-13: qty 1

## 2012-09-13 MED ORDER — HYDROCODONE-ACETAMINOPHEN 5-325 MG PO TABS
1.0000 | ORAL_TABLET | Freq: Four times a day (QID) | ORAL | Status: DC | PRN
Start: 2012-09-13 — End: 2012-11-13

## 2012-09-13 NOTE — ED Notes (Signed)
[id number]<ZH> Expected date:09/13/12<BR> Expected time: 3:27 PM<BR> Means of arrival:[id number]- [address]<BR> Comments:<BR>

## 2012-09-13 NOTE — ED Provider Notes (Signed)
EMERGENCY DEPARTMENT HISTORY AND PHYSICAL EXAM    Date: 09/13/2012  Patient Name: Dorothy [name],Dorothy [name]  Attending Physician: Azzie Glatter, MD  Physician Extender: Pablo Lawrence, PA-C  Patient DOB:  [birth year]  MRN:  [id number]  Room:  EX26/EX26      History of Presenting Illness     Chief Complaint: Back Pain x a few hours and left middle finger pain x 5-7 days    Historian:  Patient    Location:   Duration:   Frequency:   Quality:   Quantity:   Exacerbating/relieving factors:   Associated symptoms:  Tdap (If applicable):  LMP (If applicable):   Immunizations (If applicable):        This is a 37 [name] female BIBA who presents with 7/10 sharp left sided constant lower back pain that is non radiating.  [place].  The pt states she went into the air and developed left sided lower back pain afterwards.  The patient denies history of similar back pain.  The patient denies history of back surgery.  Movement makes the pain worse.  The patient denies history of IVDU.      The pt also reports left middle finger pain distally over the past 5-7 days.  It started to drain a few days ago    PMD:  None    Past Medical History     Past Medical History   Diagnosis Date   . Diabetes mellitus without complication    . Asthma without status asthmaticus    . Anemia        Past Surgical History     Past Surgical History   Procedure Date   . Cesarean section    . Cholecystectomy    . Tonsillectomy    . Hernia repair        Family History     No family history on file.    Social History     History     Social History   . Marital Status: Single     Spouse Name: N/A     Number of Children: N/A   . Years of Education: N/A     Social History Main Topics   . Smoking status: Never Smoker    . Smokeless tobacco: Not on file   . Alcohol Use: No   . Drug Use: No   . Sexually Active: Not on file     Other Topics Concern   . Not on file     Social History Narrative   . No narrative on file        Allergies     Allergies   Allergen Reactions   . Percocet (Oxycodone-Acetaminophen)      Unknown reaction         Home Medications       (Not in a hospital admission)    ED Medications Administered     ED Medication Orders      Start     Status Ordering Provider    09/13/12 1608   ibuprofen (ADVIL,MOTRIN) tablet 800 mg   Once      Route: Oral  Ordered Dose: 800 mg         Last MAR action:  Given Kerry Dory [facility]    09/13/12 1608   HYDROcodone-acetaminophen (NORCO) 5-325 MG per tablet 1 tablet   Once      Route: Oral  Ordered Dose: 1 tablet  Last MAR action:  Given Gordie Belvin DEMOND                  Review of Systems     CONSTITUTIONAL: No fevers, chills, fatigue, weight loss or malaise.  HEAD: Normocephalic.  Atraumatic.      CARDIAC: No chest pain  RESPIRATORY: No cough or shortness of breath.  GI: No abdominal pain, nausea, vomiting or bowel dysfunction  GU: No dysuria, hematuria or changes in frequency. No urinary incontinence, retention or loss of bladder control.  MS: (+) lower left sided back pain.  (+) pain distal left middle finger.  No joint pain, joint swelling, joint redness or myalgias.   NEURO: No weakness or numbness in all 4 extremities. Normal balance.    Physical Exam     CONSTITUTIONAL: Well-developed, well-nourished. Alert, cooperative and in   no acute distress. Vital signs reviewed.   HEAD: Normocephalic, atraumatic.  RESPIRATORY: Good air movement bilaterally. No wheezing, rales or rhonchi.   No retractions or use of accessory muscles. No respiratory distress.   CARDIAC: RRR. Normal S1 and S2 without murmurs, rubs or gallops.   MS: (+) TTP distal left middle finger TTP.  (+) erythema left middle finger distally.  (+) paraspinal muscle L spine TTP to the left of L-spine.  No midline bony C/T/L spine tenderness. Full ROM without pain.  Pulses 2+ and symmetric.  No foot drop bilaterally.  SKIN: Warm and dry. No rash or lesions.   NEURO: A&O x 3. CN II-XII intact. 5/5  strength in bilateral upper and lower   extremities.  Gait steady and balanced.     Procedures Or Medical Decision Making     TIME OUT    Side/site verified. Patient ID band on. Patient's identity verified by patient stating name and date of birth. The area was prepped in a sterile fashion using Shurclens solution. Sterile technique was observed using proper hand washing, gloves and sterile drape. A pre-procedure pause was initiated and verified by all team members.    INCISION AND DRAINAGE:    Verbal consent obtained. Procedure indicated for cutaneous abscess. There are no contraindications. Anesthesia used was 2% lidocaine without epinephrine, 3 mL. Using an 11 blade, a 0.5 cm incision was made over area of greatest fluctuance of left middle finger distally near nail bed. The wound drained Serosanguinous 1 mL. Wound was dressed with a sterile bandage. Neurovascular status normal after procedure. Tetanus up to date. No complications. Patient tolerated procedure well.      Diagnostic Study Results     EKG: N/A    Monitor: N/A    Laboratory results reviewed by ED provider:  Results     ** No Results found for the last 24 hours. **          Radiologic study results reviewed by ED provider:  Radiology Results (24 Hour)     ** No Results found for the last 24 hours. **      .    Rendering Provider: Pablo Lawrence, PA-C      VS     Filed Vitals:    09/13/12 1555   BP: 125/74   Pulse: 89   Temp: 97.7 F (36.5 C)   Resp: 18   SpO2: 95%  Clinical Course in Emergency Department     Consults: N/A    Reevaluation: N/A    Diagnosis and Disposition     Diagnosis  Lumbar Strain  Left middle finger paronychia    Disposition  Home      SIGNED BY: Vania Rea Howard, [place]  09/13/12 [phone]

## 2012-09-13 NOTE — Discharge Instructions (Signed)
Please return to ED immediately if you develop fevers, increase pain, increase redness or discharge from wound as these may be signs/symptoms of an infection         Follow up with your Primary Care Physician in 3-5 days       Do not drive, operate heavy machinery, drink alcohol or take Tylenol containing products while on Vicodin       Take Motrin as directed with food     Take all antibiotics as prescribed until completed      Leave dressing in place for 24 hours.   After 24 hours, remove dressing and wash area with warm soapy water.  Pat dry and replace band aid.  Do not immerse in water for next 2 weeks.
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
## 2012-10-11 ENCOUNTER — Emergency Department
Admission: EM | Admit: 2012-10-11 | Discharge: 2012-10-11 | Disposition: A | Discharge status: Home / Self Care | Payer: Medicare Other | Attending: Emergency Medicine | Admitting: Emergency Medicine

## 2012-10-11 ENCOUNTER — Emergency Department: Payer: Medicare Other

## 2012-10-11 ENCOUNTER — Emergency Department: Payer: Medicaid Other

## 2012-10-11 DIAGNOSIS — X838XXA Intentional self-harm by other specified means, initial encounter: Secondary | ICD-10-CM | POA: Insufficient documentation

## 2012-10-11 DIAGNOSIS — Y9289 Other specified places as the place of occurrence of the external cause: Secondary | ICD-10-CM | POA: Insufficient documentation

## 2012-10-11 DIAGNOSIS — J45909 Unspecified asthma, uncomplicated: Secondary | ICD-10-CM | POA: Insufficient documentation

## 2012-10-11 DIAGNOSIS — D649 Anemia, unspecified: Secondary | ICD-10-CM | POA: Insufficient documentation

## 2012-10-11 DIAGNOSIS — S6990XA Unspecified injury of unspecified wrist, hand and finger(s), initial encounter: Secondary | ICD-10-CM | POA: Insufficient documentation

## 2012-10-11 DIAGNOSIS — Z79899 Other long term (current) drug therapy: Secondary | ICD-10-CM | POA: Insufficient documentation

## 2012-10-11 DIAGNOSIS — E119 Type 2 diabetes mellitus without complications: Secondary | ICD-10-CM | POA: Insufficient documentation

## 2012-10-11 NOTE — ED Notes (Signed)
PT C/O RIGHT LAT HAND PAIN AFTER PLAYFULLY PUNCHING FIANCE. DECREASE ROM , GOOD SENSATION. PT  AN DO X 3

## 2012-10-11 NOTE — ED Provider Notes (Signed)
Physician/Midlevel provider first contact with patient: 10/11/12 1358         EMERGENCY DEPARTMENT HISTORY AND PHYSICAL EXAM    Date: 10/11/2012  Patient Name: Dorothy [name], Dorothy [name]  Midlevel Provider: Gaynelle Arabian, NP  Diagnosis and Treatment Plan       Presumptive Diagnosis:   1. Hand injury, right, initial encounter         Treatment Plan: home, see patient instructions for treatment and plan    History of Presenting Illness     Chief Complaint:   Chief Complaint   Patient presents with   . Hand Injury     Dorothy [name] is a 37 y.o. female with PMHx of DM; Asthma without status asthmaticus; and Anemia p/w RT hand injury s/p hitting hand on husband's forearm while roughhousing just pta. Sts pain is to outside of hand. Did not take pain meds pta.    PCP: Mariana Single, MD      Past Medical History     Past Medical History   Diagnosis Date   . Diabetes mellitus without complication    . Asthma without status asthmaticus    . Anemia        Past Surgical History   Procedure Date   . Cesarean section    . Cholecystectomy    . Tonsillectomy    . Hernia repair        Family History     History reviewed. No pertinent family history.    Social History     History     Social History   . Marital Status: Single     Spouse Name: N/A     Number of Children: N/A   . Years of Education: N/A     Occupational History   . Not on file.     Social History Main Topics   . Smoking status: Never Smoker    . Smokeless tobacco: Not on file   . Alcohol Use: No   . Drug Use: No   . Sexually Active: Not on file     Other Topics Concern   . Not on file     Social History Narrative   . No narrative on file        Allergies     Allergies   Allergen Reactions   . Percocet (Oxycodone-Acetaminophen)      Unknown reaction         Medications     No current facility-administered medications for this encounter.  Current outpatient prescriptions:HYDROcodone-acetaminophen (NORCO) 5-325 MG per tablet, Take 1 tablet by mouth every 6 (six) hours as  needed for Pain., Disp: 14 tablet, Rfl: 0;  lisinopril (PRINIVIL,ZESTRIL) 10 mg tablet, Take 10 mg by mouth daily., Disp: , Rfl: ;  metFORMIN (GLUCOPHAGE) 1000 MG tablet, Take 1,000 mg by mouth 2 (two) times daily with meals., Disp: , Rfl: ;  PROAIR HFA 108 (90 BASE) MCG/ACT inhaler, , Disp: , Rfl:   QVAR 80 MCG/ACT inhaler, , Disp: , Rfl:     Review of Systems     Pertinent Positives and Negatives noted in the HPI.  All Other Systems Reviewed and Negative: Yes    Physical Exam   BP 135/81  Pulse 88  Temp 98.1 F (36.7 C)  Resp 18  SpO2 96%  LMP 08/29/2012    Constitutional: Vital signs reviewed.Well appearing. Easily Conversant. Obese  Head: Normocephalic, atraumatic  Eyes: No conjunctival injection. No discharge.  ENT: Mucous membranes moist.  Neck: Normal range of motion. Trachea midline.  Respiratory/Chest:  Clear to auscultation. No respiratory distress  Cardiovascular: RUE rad and ul pulses intact 4+    UpperExtremity:No edema or cyanosis. No RT scaphoid, wrist or radial head tenderness. No deformity to hand or wrist. Pain to palpation at base of 5th metacarpal. Pain with ROM to 5th digit. Distal sensation intact. Strength intact, 5/5. Pain at base of 5th metacarpal with grip.   No pain w pro/supination, no pain w flex/ex.    Neurological: No focal motor deficits by observation. Speech normal. Memory normal.  Skin: Warm and dry. No rash. No ecchymosis.  Psychiatric: Normal affect and normal concentration for age      Diagnostic Study Results     Labs -     Results     ** No Results found for the last 24 hours. **          Radiologic Studies -   Radiology Results (24 Hour)     Procedure Component Value Units Date/Time    Hand Right PA Lateral and Oblique [[id number]] Collected:10/11/12 1439    Order Status:Completed  Updated:10/11/12 1444    Narrative:    HISTORY: Injury to the right hand.    FINDINGS: Frontal, oblique and lateral views of the right hand were  performed. The osseous structures and joint  spaces appear normal with no  fracture, dislocation, or focal lesion.       Impression:     Normal study.      Elizebeth Koller, MD   10/11/2012 2:40 PM      .    Clinical Course in the Emergency Department     Plan: pain meds, XR, ice pack, refer to hand, likely splint possible fracture.    Splint by Malachi Bonds, tech , static hand/finger    Post-splint eval by me: distal cms intact, pink, warm and cap refill < 2 sec    Pt yelling in hallway: "I have an appointment at 4:15. I told them out front. They told me no problem"    Medical Decision Making   I am the first provider for this patient.  I reviewed the vital signs, nursing notes, past medical history, past surgical history, family history and social history.  I have reviewed the patient's previous charts.  Vital Signs - BP 135/81  Pulse 88  Temp 98.1 F (36.7 C)  Resp 18  SpO2 96%  LMP 08/29/2012     Differential diagnosis: fracture, contusion, soft tissue injury    ____________________________________________________________________    I was acting as a scribe for Gaynelle Arabian, NP. on [facility]  Treatment Team: Scribe: Vanita Ingles     I am the first provider for this patient and I personally performed the services documented. Treatment Team: Scribe: Vanita Ingles is scribing for me on [facility]. This note accurately reflects work and decisions made by me.  Gaynelle Arabian, NP  ____________________________________________________________________         Gaynelle Arabian, NP  10/11/12 [phone]
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
## 2012-10-11 NOTE — Discharge Instructions (Signed)
Take medication as prescribed.    Rest, Ice elevate and call your doctor or the hand surgeon for follow up.    Return to the ER for worsening.    Hand Contusion     You have been diagnosed with a hand contusion (bruise).    A contusion is a bruise. A contusion happens when something strikes or hits the body. This breaks small blood vessels called capillaries. When the capillaries break, blood leaks out. This makes the skin look red, purple, blue, or black. The injured area may hurt for a few days. If you take a blood thinner (like Coumadin or warfarin) the bruising may be worse.    These injuries can cause pain and swelling, and the fingers may get discolored. Your evaluation today shows that you probably don t have a broken or dislocated bone. You can expect your symptoms to get better over the next7 days.    Some things you can do to help your injury are: Resting, Icing, Compressing and Elevating the injured area. Remember this as "RICE."     REST: Limit the use of the injured body part.      ICE: By applying ice to the affected area, swelling and pain can be reduced. Place some ice cubes in a re-sealable (Ziploc) bag and add some water. Put a thin washcloth between the bag and the skin. Apply the ice bag to the area for at least 20 minutes. Do this at least 4 times per day. It is okay to do this more often than directed. You can also do it for longer than directed. NEVER APPLY ICE DIRECTLY TO THE SKIN.      COMPRESS: Compression means to apply pressure around the injured area such as with a splint, cast or an ace bandage. Compression decreases swelling and improves comfort. Compression should be tight enough to relieve swelling but not so tight as to decrease circulation. Increasing pain, numbness, tingling, or change in skin color, are all signs of decreased circulation.     ELEVATE: Elevate the injured part.     As your pain starts to get better, you ll need to do gentle stretches with your  injured hand and work on increasing your range of motion. This will help your hand from getting stiff and make the symptoms not last as long.    Your doctor may prescribe you pain medications for yourpain. You can also use over-the-counter medicines like acetaminophen (Tylenol), ibuprofen (Advil or Motrin) or naproxen (Aleve, Naprosyn). It is important to follow the directions for taking these medications.    YOU SHOULD SEEK MEDICAL ATTENTION IMMEDIATELY, EITHER HERE OR AT THE NEAREST EMERGENCY DEPARTMENT, IF ANY OF THE FOLLOWING OCCUR:     Your symptoms haven t started to get better in 5-10 days.   The hand is noticeably misshapen after the swelling gets better, especially if you didn t get x-rays during your visit. Sometimes a small fracture can t be seen easily with the 1st x-ray.   You start to have severe pain in the affected hand, or thehand becomes pale, numb, and very firm to the touch.    If you can t follow up with your doctor, or if at any time you feel you need to be rechecked or seen again, come back here or go to the nearest emergency department.

## 2012-10-12 ENCOUNTER — Other Ambulatory Visit: Payer: Self-pay | Admitting: Internal Medicine

## 2012-10-12 ENCOUNTER — Ambulatory Visit
Admission: RE | Admit: 2012-10-12 | Discharge: 2012-10-12 | Disposition: A | Discharge status: Home / Self Care | Payer: Medicare Other | Source: Ambulatory Visit | Attending: Internal Medicine | Admitting: Internal Medicine

## 2012-10-12 ENCOUNTER — Emergency Department
Admission: EM | Admit: 2012-10-12 | Discharge: 2012-10-12 | Disposition: A | Discharge status: Home / Self Care | Payer: Medicare Other | Source: Home / Self Care

## 2012-10-12 ENCOUNTER — Ambulatory Visit
Admission: EM | Admit: 2012-10-12 | Discharge: 2012-10-12 | Disposition: A | Discharge status: Home / Self Care | Payer: Medicare Other | Attending: Emergency Medicine | Admitting: Emergency Medicine

## 2012-10-12 DIAGNOSIS — Z5320 Procedure and treatment not carried out because of patient's decision for unspecified reasons: Secondary | ICD-10-CM | POA: Insufficient documentation

## 2012-10-12 DIAGNOSIS — T1490XA Injury, unspecified, initial encounter: Secondary | ICD-10-CM | POA: Insufficient documentation

## 2012-10-15 ENCOUNTER — Emergency Department: Payer: Medicare Other

## 2012-10-15 ENCOUNTER — Emergency Department
Admission: EM | Admit: 2012-10-15 | Discharge: 2012-10-16 | Disposition: A | Discharge status: Home / Self Care | Payer: Medicare Other | Attending: Emergency Medical Services | Admitting: Emergency Medical Services

## 2012-10-15 DIAGNOSIS — K469 Unspecified abdominal hernia without obstruction or gangrene: Secondary | ICD-10-CM

## 2012-10-15 DIAGNOSIS — L03319 Cellulitis of trunk, unspecified: Secondary | ICD-10-CM | POA: Insufficient documentation

## 2012-10-15 DIAGNOSIS — L02219 Cutaneous abscess of trunk, unspecified: Secondary | ICD-10-CM | POA: Insufficient documentation

## 2012-10-15 DIAGNOSIS — K458 Other specified abdominal hernia without obstruction or gangrene: Secondary | ICD-10-CM | POA: Insufficient documentation

## 2012-10-15 DIAGNOSIS — M25539 Pain in unspecified wrist: Secondary | ICD-10-CM | POA: Insufficient documentation

## 2012-10-15 DIAGNOSIS — L0291 Cutaneous abscess, unspecified: Secondary | ICD-10-CM

## 2012-10-15 DIAGNOSIS — E119 Type 2 diabetes mellitus without complications: Secondary | ICD-10-CM | POA: Insufficient documentation

## 2012-10-15 DIAGNOSIS — M25531 Pain in right wrist: Secondary | ICD-10-CM

## 2012-10-15 MED ORDER — CEPHALEXIN 500 MG PO CAPS
500.0000 mg | ORAL_CAPSULE | Freq: Four times a day (QID) | ORAL | Status: AC
Start: 2012-10-15 — End: 2012-10-25

## 2012-10-15 MED ORDER — CEPHALEXIN 250 MG PO CAPS
500.0000 mg | ORAL_CAPSULE | Freq: Once | ORAL | Status: AC
Start: 2012-10-15 — End: 2012-10-15
  Administered 2012-10-15: 500 mg via ORAL
  Filled 2012-10-15: qty 2

## 2012-10-15 MED ORDER — SULFAMETHOXAZOLE-TRIMETHOPRIM 800-160 MG PO TABS
1.0000 | ORAL_TABLET | Freq: Two times a day (BID) | ORAL | Status: AC
Start: 2012-10-15 — End: 2012-10-25

## 2012-10-15 NOTE — ED Provider Notes (Signed)
Physician/Midlevel provider first contact with patient: 10/15/12 2254         EMERGENCY DEPARTMENT NOTE    Physician/Midlevel provider first contact with patient: 10/15/12 2254         HISTORY OF PRESENT ILLNESS   Historian:Patient  Translator Used: No    37 y.o. female suprapubic pain for 3 days. Right wrist pain, wants splint. Chronic abdominal hernia want gen surg referral.     1. Location of symptoms: skin pain  2. Onset of symptoms: 3 days  3. What was patient doing when symptoms started (Context): nothing  4. Severity: moderate  5. Timing: constant  6. Activities that worsen symptoms: touching the site of the boil  7. Activities that improve symptoms: none  8. Quality:   9. Radiation of symptoms:  10. Associated signs and Symptoms: right suprapubic pain  11. Are symptoms worsening?yes  MEDICAL HISTORY     Past Medical History:  Past Medical History   Diagnosis Date   . Diabetes mellitus without complication    . Asthma without status asthmaticus    . Anemia        Past Surgical History:  Past Surgical History   Procedure Date   . Cesarean section    . Cholecystectomy    . Tonsillectomy    . Hernia repair        Social History:  History     Social History   . Marital Status: Single     Spouse Name: N/A     Number of Children: N/A   . Years of Education: N/A     Occupational History   . Not on file.     Social History Main Topics   . Smoking status: Never Smoker    . Smokeless tobacco: Not on file   . Alcohol Use: No   . Drug Use: No   . Sexually Active: Not on file     Other Topics Concern   . Not on file     Social History Narrative   . No narrative on file       Family History:  No family history on file.    Outpatient Medication:  Previous Medications    HYDROCODONE-ACETAMINOPHEN (NORCO) 5-325 MG PER TABLET    Take 1 tablet by mouth every 6 (six) hours as needed for Pain.    LISINOPRIL (PRINIVIL,ZESTRIL) 10 MG TABLET    Take 10 mg by mouth daily.    METFORMIN (GLUCOPHAGE) 1000 MG TABLET    Take 1,000 mg by  mouth 2 (two) times daily with meals.    PROAIR HFA 108 (90 BASE) MCG/ACT INHALER        QVAR 80 MCG/ACT INHALER             REVIEW OF SYSTEMS   ADD ROS  Review of Systems   Gastrointestinal:        Chronic abdominal hernia, no pain   Skin:        Right suprapubic boil   All other systems reviewed and are negative.            PHYSICAL EXAM     Filed Vitals:    10/15/12 2235   BP: 132/74   Pulse: 78   Temp: 97.8 F (36.6 C)   Resp: 18   SpO2: 98%       Nursing note and vitals reviewed.  Constitutional:  Well developed, well nourished. Awake & Oriented x3.  Head:  Atraumatic. Normocephalic.  Eyes:  PERRL. EOMI. Conjunctivae are not pale.  ENT:  Mucous membranes are moist and intact. Oropharynx is clear and symmetric.  Patent airway.  Neck:  Supple. Full ROM.    Cardiovascular:  Regular rate. Regular rhythm. No murmurs, rubs, or gallops.  Pulmonary/Chest:  No evidence of respiratory distress. Clear to auscultation bilaterally.  No wheezing, rales or rhonchi.   Abdominal:  Soft and non-distended. There is no tenderness. Large pannus.   Back:  Full ROM. Nontender.  GU: normal external genitalia, mild erythema, small area of induration, no fluctuance.   Extremities:  No edema. No cyanosis. No clubbing. Full range of motion in all extremities.  Skin:  Skin is warm and dry.  No diaphoresis. No rash.   Neurological:  Alert, awake, and appropriate. Normal speech. Motor normal.  Psychiatric:  Good eye contact. Normal interaction, affect, and behavior.        MEDICAL DECISION MAKING     DISCUSSION      Skin abscess, not ready to be drained; r/o pregnancy, abdominal hernia, non-tender, no n/v/constipation, unlikely incarcerated will give gen surg referral.  Right wrist pain, will give velcro splint for comfort.     Vital Signs: Reviewed the patient?s vital signs.   Nursing Notes: Reviewed and utilized available nursing notes.  Medical Records Reviewed: Reviewed available past medical records.      PROCEDURES        CARDIAC  STUDIES    The following cardiac studies were independently interpreted by the Emergency Medicine Physician.  For full cardiac study results please see chart.      IMAGING STUDIES    The following imaging studies were independently interpreted by the Emergency Medicine Physician.  For full imaging study results please see chart.    EMERGENCY DEPT. MEDICATIONS      ED Medication Orders      Start     Status Ordering Provider    10/16/12 0009   sulfamethoxazole-trimethoprim (BACTRIM DS) 800-160 MG per tablet 1 tablet   Once      Route: Oral  Ordered Dose: 1 tablet         Last MAR action:  Given Venetta Knee SHU    10/15/12 2337   cephALEXin (KEFLEX) capsule 500 mg   Once      Route: Oral  Ordered Dose: 500 mg         Last MAR action:  Given Theodor Mustin SHU                LABORATORY RESULTS    Ordered and independently interpreted AVAILABLE laboratory tests. Please see results section in chart for full details.  Results for orders placed during the hospital encounter of 10/15/12   URINE HCG QUALITATIVE       Component Value Range    Urine HCG Qualitative Negative  Negative       CONSULTATIONS and ED Course      .Patient feels better. I have discussed all testing results and plan of care with patient. Patient agrees with going home and following up and return if worsening symptoms. Side effects of medications such as dizziness associated with opioids and muscle relaxants, nausea/vomiting and constipation associated with opioids and potential complications of antibiotics i.e. c. diff, yeast infection, rash, allergic reaction discussed.       ATTESTATIONS      Physician Attestation: I, Dr. Zadie Rhine Bridie Colquhoun, MD PhD , have been the primary provider for Allegra Grana during this Emergency Dept visit  and have reviewed the chart documented for accuracy and agree with its content.       DIAGNOSIS      Diagnosis:  Final diagnoses:   Skin abscess   Pain in right wrist   Abdominal hernia       Disposition:  ED Disposition      Discharge Allegra Grana discharge to home/self care.    Condition at discharge: Good            Prescriptions:  Patient's Medications   New Prescriptions    CEPHALEXIN (KEFLEX) 500 MG CAPSULE    Take 1 capsule (500 mg total) by mouth 4 (four) times daily.    SULFAMETHOXAZOLE-TRIMETHOPRIM (BACTRIM DS) 800-160 MG PER TABLET    Take 1 tablet by mouth 2 (two) times daily.   Previous Medications    HYDROCODONE-ACETAMINOPHEN (NORCO) 5-325 MG PER TABLET    Take 1 tablet by mouth every 6 (six) hours as needed for Pain.    LISINOPRIL (PRINIVIL,ZESTRIL) 10 MG TABLET    Take 10 mg by mouth daily.    METFORMIN (GLUCOPHAGE) 1000 MG TABLET    Take 1,000 mg by mouth 2 (two) times daily with meals.    PROAIR HFA 108 (90 BASE) MCG/ACT INHALER        QVAR 80 MCG/ACT INHALER       Modified Medications    No medications on file   Discontinued Medications    No medications on file           Andreanna Mikolajczak, Zadie Rhine, MD [facility]  10/19/12 0155

## 2012-10-15 NOTE — ED Notes (Signed)
Pt c/o of boil in right groin. Pt has c/o of right wrist pain from previous injury.

## 2012-10-16 LAB — URINE HCG QUALITATIVE: Urine HCG Qualitative: NEGATIVE

## 2012-10-16 MED ORDER — SULFAMETHOXAZOLE-TMP DS 800-160 MG PO TABS
1.0000 | ORAL_TABLET | Freq: Once | ORAL | Status: AC
Start: 2012-10-16 — End: 2012-10-16
  Administered 2012-10-16: 1 via ORAL
  Filled 2012-10-16: qty 1

## 2012-10-16 NOTE — Discharge Instructions (Signed)
If you develop diarrhea, stop antibiotic and call pmd or ed for further instructions.   Return if worsening symptoms.     Abscess    You were diagnosed with an abscess of the:   Skin.    An abscess is a sore that is infected and filled with pus. It is caused by an infection with bacteria. Sometimes a splinter or other object stuck in the skin can cause an infection that can become an abscess. The body traps the infection in a tight pocket to try to stop the infection from spreading to other areas. The usual treatment is to make a cut in the abscess so the pus can drain out. Most abscesses heal quickly with no need for antibiotics.    Your doctor has determined that antibiotics ARE necessary to treat your abscess. Fill the prescription and take all medications as prescribed until they are all gone.    The doctor suspects that your infection is resistant to (not killed by) the usual antibiotics. This infection is caused by bacteria called Methicillin-Resistant Staphylococcus Aureus (MRSA).   Your doctor prescribed an antibiotic that will work against MRSA. IT IS IMPORTANT to fill and finish this prescription.    A drain and/or packing have been placed in your abscess to help the wound heal. The packing in your wound will need to be changed. This can be done by your family doctor, a referral doctor, this facility or the nearest Emergency Department. Your physician will set up a plan for you to have the packing changed. Leave the drain and packing in place until you see a doctor. The drain might fall out on it's own. If this happens, cover the abscess with a clean dressing (bandage) and follow up with a doctor as scheduled. Until the packing is removed, don't soak the wound in water, like in a bathtub or pool. Short showers or sponge baths are okay. Keep the wound as dry and clean as possible.    YOU SHOULD SEEK MEDICAL ATTENTION IMMEDIATELY, EITHER HERE OR AT THE NEAREST EMERGENCY DEPARTMENT, IF ANY OF THE  FOLLOWING OCCURS:   You see unusual redness or swelling.   You see red streaks on the arm or leg.   The wound or drainage smells bad.   You have fevers, chills, worse pain or swelling.

## 2012-10-29 DIAGNOSIS — S60229A Contusion of unspecified hand, initial encounter: Secondary | ICD-10-CM | POA: Insufficient documentation

## 2012-10-29 DIAGNOSIS — J45909 Unspecified asthma, uncomplicated: Secondary | ICD-10-CM | POA: Insufficient documentation

## 2012-10-29 DIAGNOSIS — S93499A Sprain of other ligament of unspecified ankle, initial encounter: Secondary | ICD-10-CM | POA: Insufficient documentation

## 2012-10-29 DIAGNOSIS — E119 Type 2 diabetes mellitus without complications: Secondary | ICD-10-CM | POA: Insufficient documentation

## 2012-10-29 DIAGNOSIS — D649 Anemia, unspecified: Secondary | ICD-10-CM | POA: Insufficient documentation

## 2012-10-29 DIAGNOSIS — S96819A Strain of other specified muscles and tendons at ankle and foot level, unspecified foot, initial encounter: Secondary | ICD-10-CM | POA: Insufficient documentation

## 2012-10-29 DIAGNOSIS — W010XXA Fall on same level from slipping, tripping and stumbling without subsequent striking against object, initial encounter: Secondary | ICD-10-CM | POA: Insufficient documentation

## 2012-10-30 ENCOUNTER — Emergency Department: Payer: Medicare Other

## 2012-10-30 ENCOUNTER — Emergency Department
Admission: EM | Admit: 2012-10-30 | Discharge: 2012-10-30 | Disposition: A | Discharge status: Home / Self Care | Payer: Medicare Other | Attending: Emergency Medicine | Admitting: Emergency Medicine

## 2012-10-30 DIAGNOSIS — S93402A Sprain of unspecified ligament of left ankle, initial encounter: Secondary | ICD-10-CM

## 2012-10-30 DIAGNOSIS — S60221A Contusion of right hand, initial encounter: Secondary | ICD-10-CM

## 2012-10-30 NOTE — Discharge Instructions (Signed)
Ankle Sprain    You have been diagnosed with an ankle sprain.    A sprain is a ligament injury, usually a tear or partial tear. Sprains can hurt as much as broken bones. Sprains can be classified by the degree of injury. A first-degree sprain is considered a minor tear. A second-degree sprain is a partial tear of the ligament. A third-degree sprain often involves a small fracture, or break, of the bone that the ligament is attached to.    Sprains are usually treated with pain medication and a splint to keep the joint from moving. You should Rest, Ice, Compress, and Elevate the injured ankle. Remember this as "RICE."   REST: Limit the use of the injured body part.   ICE: By applying ice to the affected area, swelling and pain can be reduced. Place some ice cubes in a re-sealable (Ziploc) bag and add some water. Put a thin washcloth between the bag and the skin. Apply the ice bag to the area for at least 20 minutes. Do this at least 4 times per day. Using the ice for longer times and more frequently is OK. NEVER APPLY ICE DIRECTLY TO THE SKIN.   COMPRESS: Compression means to apply pressure around the injured area such as with a splint, cast or an ace bandage. Compression decreases swelling and improves comfort. Compression should be tight enough to relieve swelling but not so tight as to decrease circulation. Increasing pain, numbness, tingling, or change in skin color, are all signs of decreased circulation.   ELEVATE: Elevate the injured part. For example, a sprained ankle can be placed up on a chair while sitting and propped up on pillows while lying down.    You have been given an AIR/GEL SPLINT to use. For the next 2 weeks, wear the splint all the time except when you sleep or take a bath. After 2 weeks, keep using the splint when you play sports, run, hike, walk on uneven ground, or do any activity that might injure your ankle.    Ankle exercises are described below. Begin the exercises as soon as you  are able. They will make the ankle stronger to prevent new injuries. Do the exercises 5 to 10 times each day.   Use your big toe to draw out the letters of the alphabet on the ground. Move your ankle as you make each letter.   Sit with your leg straight out in front of you. Wrap a towel around the ball of your foot (just below your toes) and pull back. Pull hard enough to stretch the ankle. Don't pull hard enough to cause pain. Hold the stretch for 30 seconds.   Stand up. Rock onto the tiptoes of the injured foot and then return to the flat position. Repeat 10 times.   Rotate your ankle in a circle. Make 10 clockwise circles, then make 10 circles going the other way.    YOU SHOULD SEEK MEDICAL ATTENTION IMMEDIATELY, EITHER HERE OR AT THE NEAREST EMERGENCY DEPARTMENT, IF ANY OF THE FOLLOWING OCCURS:   Your pain gets much worse.   Your ankle or foot starts to tingle or it becomes numb.   Your foot is cold or pale. This might mean there is a problem with circulation (blood supply).    Rest , avoid strenuous activity.avoid weight bearing for 2-3 days. After that weight bearing as tolerated  Motrin for pain. Folllow up with ortho specialist in 2-3 days  REturn if worsening pain swelling or  if any other concerns or questions

## 2012-10-30 NOTE — ED Provider Notes (Signed)
Physician/Midlevel provider first contact with patient: 10/30/12 0106         History     Chief Complaint   Patient presents with   . Fall     Patient is a 37 y.o. female presenting with fall. The history is provided by the patient. No language interpreter was used.   Fall  The accident occurred 1 to 2 hours ago. The fall occurred while walking.     Tripped and fell while walking  Inversion injury to R ankle, also hit R hand to break her fall  C/o R hand and R ankle pain  Hx of r ankle fx 1 yr ago. Notes intermittent pain since then  Denies feeling lightheaded faint or dizzy prior to fall  No cp or sob  No head trauma    Past Medical History   Diagnosis Date   . Diabetes mellitus without complication    . Asthma without status asthmaticus    . Anemia        Past Surgical History   Procedure Date   . Cesarean section    . Cholecystectomy    . Tonsillectomy    . Hernia repair        History reviewed. No pertinent family history.    Social  History   Substance Use Topics   . Smoking status: Never Smoker    . Smokeless tobacco: Not on file   . Alcohol Use: No       .     Allergies   Allergen Reactions   . Percocet (Oxycodone-Acetaminophen)      Unknown reaction         Current/Home Medications    HYDROCODONE-ACETAMINOPHEN (NORCO) 5-325 MG PER TABLET    Take 1 tablet by mouth every 6 (six) hours as needed for Pain.    LISINOPRIL (PRINIVIL,ZESTRIL) 10 MG TABLET    Take 10 mg by mouth daily.    METFORMIN (GLUCOPHAGE) 1000 MG TABLET    Take 1,000 mg by mouth 2 (two) times daily with meals.    PROAIR HFA 108 (90 BASE) MCG/ACT INHALER        QVAR 80 MCG/ACT INHALER            Review of Systems   Skin: Negative for wound.   All other systems reviewed and are negative.        Physical Exam    BP 188/104  Pulse 95  Temp 97.1 F (36.2 C)  Resp 24  SpO2 99%  LMP 10/08/2012  Breastfeeding? Unknown    Physical Exam   Nursing note and vitals reviewed.  Constitutional: She is oriented to person, place, and time. She appears  well-developed and well-nourished. No distress.   HENT:   Head: Normocephalic and atraumatic.   Mouth/Throat: Oropharynx is clear and moist.   Eyes: Conjunctivae normal are normal. Pupils are equal, round, and reactive to light. Right eye exhibits no discharge. Left eye exhibits no discharge.   Neck: Normal range of motion. No tracheal deviation present.   Cardiovascular: Normal rate.    Pulses:       Radial pulses are 2+ on the right side, and 2+ on the left side.   Pulmonary/Chest: Effort normal. No respiratory distress.   Musculoskeletal: Normal range of motion.        R hand: no deformity no swelling  ttp along R metacarpal  Normal hand cascade  Radial and ulnar pulse 2+  Cap refill brisk  FROM of all digits  R ankle: no deformity no swelling  ttp over lateral malleolus  Base of 5th nttp  Pedal pulse 2+     Neurological: She is alert and oriented to person, place, and time.   Skin: Skin is warm and dry. She is not diaphoretic.   Psychiatric: She has a normal mood and affect.       MDM and ED Course     ED Medication Orders     None           MDM  Number of Diagnoses or Management Options     Amount and/or Complexity of Data Reviewed  Tests in the radiology section of CPT: ordered  Discuss the patient with other providers: yes    Risk of Complications, Morbidity, and/or Mortality  Presenting problems: low  Diagnostic procedures: low  Management options: low          Procedures    Clinical Impression & Disposition     Presumptive Diagnosis: fall    Treatment Plan: xrays    _______________________________    I reviewed the vital signs, nursing notes, past medical history, past surgical history, family history and social history.    Vital Signs - Patient Vitals for the past 12 hrs:   BP Temp Pulse Resp   10/30/12 0001 188/104 mmHg 97.1 F (36.2 C) 95  24        Pulse Oximetry Analysis - Normal    Differential Diagnosis (not completely inclusive): fracture, sprrain    Laboratory results reviewed by ED PA-C:    Radiologic study results reviewed by ED PA-C: Yes  Radiologic Studies Interpreted (viewed) by ED PA-C :No      Labs -     Results     ** No Results found for the last 24 hours. **          Radiologic Studies -   Radiology Results (24 Hour)     ** No Results found for the last 24 hours. **      .    Procedures:     Tommi Rumps, PA-C  1:10 AM                        Clinical Impression  Final diagnoses:   None        ED Disposition     None           New Prescriptions    No medications on file               Tommi Rumps, [place]  10/30/12 0110

## 2012-10-30 NOTE — ED Provider Notes (Signed)
Attending Note    Seen by PA -  Fall and pain in right wrist and ankle - no other injury.  X-rays - no fx on my review of x-rays.  Stable for discharge.  Agree with plan of care.    Maurine Minister, MD  10/30/12 [phone]

## 2012-10-30 NOTE — ED Notes (Signed)
Right ankle and right hand pain. Pt was looking for a place to stay and fell while walking across the street. Hx dm and asthma. dexi 380. 132/78. Tenderness to lateral hand and ankle. No swelling noted. Pt states that her aunt just passed away in our hospital.

## 2012-11-03 ENCOUNTER — Emergency Department: Payer: Medicare Other

## 2012-11-03 ENCOUNTER — Emergency Department
Admission: EM | Admit: 2012-11-03 | Discharge: 2012-11-03 | Disposition: A | Discharge status: Home / Self Care | Payer: Medicare Other | Attending: Emergency Medicine | Admitting: Emergency Medicine

## 2012-11-03 DIAGNOSIS — E119 Type 2 diabetes mellitus without complications: Secondary | ICD-10-CM | POA: Insufficient documentation

## 2012-11-03 DIAGNOSIS — M25473 Effusion, unspecified ankle: Secondary | ICD-10-CM | POA: Insufficient documentation

## 2012-11-03 LAB — POCT URINALYSIS AUTOMATED (IAH)
Bilirubin, UA POCT: NEGATIVE
Glucose, UA POCT: 500 — AB
Ketones, UA POCT: NEGATIVE mg/dL
Nitrite, UA POCT: NEGATIVE
PH, UA POCT: 7 (ref 4.6–8)
Protein, UA POCT: NEGATIVE mg/dL
Specific Gravity, UA POCT: 1.01 mg/dL (ref 1.001–1.035)
Urine Leukocytes POCT: NEGATIVE
Urobilinogen, UA POCT: 0.2 mg/dL

## 2012-11-03 LAB — COMPREHENSIVE METABOLIC PANEL
ALT: 10 U/L (ref 0–55)
AST (SGOT): 11 U/L (ref 5–34)
Albumin/Globulin Ratio: 0.9 (ref 0.9–2.2)
Albumin: 3 g/dL — ABNORMAL LOW (ref 3.5–5.0)
Alkaline Phosphatase: 105 U/L (ref 40–150)
Anion Gap: 8 (ref 5.0–15.0)
BUN: 8 mg/dL (ref 7.0–19.0)
Bilirubin, Total: 0.3 mg/dL (ref 0.2–1.2)
CO2: 26 (ref 22–29)
Calcium: 9.4 mg/dL (ref 8.5–10.5)
Chloride: 100 (ref 98–107)
Creatinine: 0.8 mg/dL (ref 0.6–1.0)
Globulin: 3.5 g/dL (ref 2.0–3.6)
Glucose: 345 mg/dL — ABNORMAL HIGH (ref 70–100)
Potassium: 4 (ref 3.5–5.1)
Protein, Total: 6.5 g/dL (ref 6.0–8.3)
Sodium: 134 — ABNORMAL LOW (ref 136–145)

## 2012-11-03 LAB — POCT PREGNANCY TEST, URINE HCG: POCT Pregnancy HCG Test, UR: NEGATIVE

## 2012-11-03 LAB — CBC AND DIFFERENTIAL
Basophils Absolute Automated: 0.02 (ref 0.00–0.20)
Basophils Automated: 0 %
Eosinophils Absolute Automated: 0.29 (ref 0.00–0.70)
Eosinophils Automated: 4 %
Hematocrit: 34.7 % — ABNORMAL LOW (ref 37.0–47.0)
Hgb: 11.7 g/dL — ABNORMAL LOW (ref 12.0–16.0)
Immature Granulocytes Absolute: 0.02
Immature Granulocytes: 0 %
Lymphocytes Absolute Automated: 2.25 (ref 0.50–4.40)
Lymphocytes Automated: 28 %
MCH: 26.6 pg — ABNORMAL LOW (ref 28.0–32.0)
MCHC: 33.7 g/dL (ref 32.0–36.0)
MCV: 78.9 fL — ABNORMAL LOW (ref 80.0–100.0)
MPV: 10.4 fL (ref 9.4–12.3)
Monocytes Absolute Automated: 0.42 (ref 0.00–1.20)
Monocytes: 5 %
Neutrophils Absolute: 5.07 (ref 1.80–8.10)
Neutrophils: 63 %
Nucleated RBC: 0 (ref 0–1)
Platelets: 284 (ref 140–400)
RBC: 4.4 (ref 4.20–5.40)
RDW: 15 % (ref 12–15)
WBC: 8.05 (ref 3.50–10.80)

## 2012-11-03 LAB — B-TYPE NATRIURETIC PEPTIDE: B-Natriuretic Peptide: <10 pg/mL (ref 0–100)

## 2012-11-03 LAB — TROPONIN I: Troponin I: 0.02 ng/mL

## 2012-11-03 LAB — GFR: EGFR: >60

## 2012-11-03 LAB — HEMOLYSIS INDEX: Hemolysis Index: 5 (ref 0–18)

## 2012-11-03 NOTE — ED Notes (Signed)
Pt arrives via ambulance c/o palpitations and ankle swelling. States ankles have been swollen for "a few weeks" states a feeling of racing heart beginning this evening while "walking around". EMS reports HR 94, normal sinus, during transport. Pt is alert, oriented, and ambulatory to bathroom on arrival.

## 2012-11-03 NOTE — ED Notes (Signed)
Lab called at 0605 to inform this RN that they had never received samples from the 0210 draw. This RN personally recalls drawing and sending bloods, lab insists there are no samples present. Samples redrawn with straight stick, pt resting comfortably. PA and charge RN aware of delay in care.

## 2012-11-03 NOTE — ED Notes (Signed)
Patient is resting comfortably. 

## 2012-11-03 NOTE — ED Provider Notes (Signed)
I have reviewed and agree with history. The pertinent physical exam has been documented. The patient was seen and examined by PA or NP; plan of care was discussed with me.    Pt seen & examined by me. No stridor, no drooling, no accessory muscle use. Anicteric. Elevated BMI. Trace pitting edema B/L Dorothy [name]. 2+ b/l DP pulses. No palpable cords b/l, no calf tenderness b/l    EKG:  Interpreted by the EP.   Rate: 90   Rhythm: Normal Sinus Rhythm    Interpretation: TWI III   Comparison: 4/14        Annett Fabian, MD  11/04/12 [phone]

## 2012-11-03 NOTE — Discharge Instructions (Signed)
Please return to the ED if you develop new or worsening symptoms    Follow up with Dr. Conley Rolls (primary care physician) in 3 days    Keep your legs elevated and wear compression hose

## 2012-11-03 NOTE — ED Provider Notes (Signed)
EMERGENCY DEPARTMENT HISTORY AND PHYSICAL EXAM    Date: 11/03/2012  Patient Name: [name],[name]  Attending Physician: Annett Fabian, MD  Physician Extender: Pablo Lawrence, PA-C  Patient DOB:  [birth year]  MRN:  [id number]  Room:  GR11/GR11      History of Presenting Illness     Chief Complaint: bilateral ankle swelling for a few weeks and felt like heart racing when walking around    Historian:  patient    Location:   Duration:   Frequency:   Quality:   Quantity:   Exacerbating/relieving factors:   Associated symptoms:  Tdap (If applicable):  LMP (If applicable):         This is a 37 y.o. female BIBA who presents with bilateral ankle swelling for a few weeks.  No recent trauma.  No chest pain or SOB.  No calf pain or recent flights.  She did feel palpitations while walking around tonight.      PMD:  Minna Antis, MD    Past Medical History     Past Medical History   Diagnosis Date   . Diabetes mellitus without complication    . Asthma without status asthmaticus    . Anemia        Past Surgical History     Past Surgical History   Procedure Date   . Cesarean section    . Cholecystectomy    . Tonsillectomy    . Hernia repair        Family History     History reviewed. No pertinent family history.    Social History     History     Social History   . Marital Status: Significant Other     Spouse Name: N/A     Number of Children: N/A   . Years of Education: N/A     Social History Main Topics   . Smoking status: Never Smoker    . Smokeless tobacco: Not on file   . Alcohol Use: No   . Drug Use: No   . Sexually Active: Not on file     Other Topics Concern   . Not on file     Social History Narrative   . No narrative on file       Allergies     Allergies   Allergen Reactions   . Percocet (Oxycodone-Acetaminophen)      Unknown reaction         Home Medications       (Not in a hospital admission)    ED Medications Administered     ED Medication Orders     None            Review of Systems     CONSTITUTIONAL: No fever, chills,  fatigue, weight loss or malaise.  EYES: No changes in vision.   ENMT: No hearing changes, otalgia, otorrhea, rhinorrhea, epistaxis, sore throat, voice changes, drooling, neck pain or stiffness.  CARDIAC: (+) palpitations.  No chest pain, diaphoresis,  DOE, orthopnea or edema.  RESPIRATORY: No cough, wheezing, stridor or shortness of breath.  GI: No abdominal pain, nausea, vomiting, hematemesis, diarrhea, melena, hematochezia or changes in appetite.  MS: (+) bilateral ankle swelling.  No back pain, joint pain, joint redness or myalgias.   NEURO: No headache, dizziness, weakness, numbness or LOC. No lethargy, slurred speech or confusion. Normal balance.  INTEGUMENTARY: No skin rash, abrasions or breaks in skin.  HEMATOLOGIC: No swollen lymph nodes  Physical Exam     CONSTITUTIONAL: Well-developed, well-nourished. Alert, cooperative and in   no acute distress. Vital signs reviewed.   HEAD: Normocephalic, atraumatic.  RESPIRATORY: Good air movement bilaterally. No wheezing, rales or rhonchi.   No retractions or use of accessory muscles. No respiratory distress.   CARDIAC: RRR. Normal S1 and S2 without murmurs, rubs or gallops.   GI: Soft and non-tender throughout. Non-distended, normal bowel sounds, no   organomegaly, no peritoneal signs. No CVA tenderness.   MS: Mild bilateral ankle soft tissue swelling.  No bony tenderness. Full ROM without pain. No edema or erythema or STS. No calf tenderness. Pulses 2+ and symmetric DP.    SKIN: Warm and dry. No rash or lesions. No abrasions or breaks in skin.  NEURO: A&O x 3. CN II-XII intact. 5/5 strength in bilateral upper and lower   extremities. Sensory intact throughout. Gait steady and balanced.       Procedures Or Medical Decision Making     N/A    Diagnostic Study Results     EKG: NSR.  HR = 90    Monitor: N/A    Laboratory results reviewed by ED provider:  Results     Procedure Component Value Units Date/Time    Urine HCG POC [[id number]] Collected:11/03/12 0319      POCT QC Pass Updated:11/03/12 0319     POCT Pregnancy HCG Test, UR Negative      Comment:        Result:     Negative Value is Normal in Healthy Males or Healthy non-pregnant Females    UA POC (POCT UA Clinitek AX) [[id number]]  (Abnormal) Collected:11/03/12 0319     Color UA POCT Yellow Updated:11/03/12 0319     Clarity UA POCT Clear      Glucose, UA POCT =500 (A)      Bilirubin, UA POCT Negative      Ketones, UA POCT Negative mg/dL      Specific Gravity, UA POCT 1.010 mg/dL      Blood, UA POCT  Large (A)      PH, UA POCT 7.0      Protein, UA POCT Negative mg/dL      Urobilinogen, UA POCT 0.2 mg/dL      Nitrite, UA POCT Negative      Leukocytes, UA POCT Negative     CBC and differential [[id number]] Collected:11/03/12 0308    Specimen Information:Blood / Blood Updated:11/03/12 0308    Comprehensive Metabolic Panel (CMP) [[id number]] Collected:11/03/12 0307    Specimen Information:Blood Updated:11/03/12 0308    Troponin I [[id number]] Collected:11/03/12 0307    Specimen Information:Blood Updated:11/03/12 0308          Radiologic study results reviewed by ED provider:  Radiology Results (24 Hour)     ** No Results found for the last 24 hours. **      .    Rendering Provider: Pablo Lawrence, PA-C      VS     Filed Vitals:    11/03/12 0105   BP: 130/61   Pulse: 94   Temp: 98.1 F (36.7 C)   Resp: 18   SpO2: 99%  Clinical Course in Emergency Department     Consults: N/A    Reevaluation:     (07:47) - pt resting in bed.  Doing well.  Updated on lab results.      Diagnosis and Disposition     Diagnosis  Bilateral Ankle Swelling    Disposition  Home      SIGNED BY: Vania Rea [place], [place]  11/03/12 [phone]

## 2012-11-04 LAB — ECG 12-LEAD
Atrial Rate: 90 {beats}/min
P Axis: 67 degrees
P-R Interval: 144 ms
Q-T Interval: 380 ms
QRS Duration: 92 ms
QTC Calculation (Bezet): 464 ms
R Axis: 2 degrees
T Axis: 13 degrees
Ventricular Rate: 90 {beats}/min

## 2012-11-13 ENCOUNTER — Emergency Department: Payer: Medicare Other

## 2012-11-13 ENCOUNTER — Emergency Department
Admission: EM | Admit: 2012-11-13 | Discharge: 2012-11-13 | Disposition: A | Discharge status: Home / Self Care | Payer: Medicare Other | Attending: Emergency Medicine | Admitting: Emergency Medicine

## 2012-11-13 DIAGNOSIS — E119 Type 2 diabetes mellitus without complications: Secondary | ICD-10-CM | POA: Insufficient documentation

## 2012-11-13 DIAGNOSIS — Z885 Allergy status to narcotic agent status: Secondary | ICD-10-CM | POA: Insufficient documentation

## 2012-11-13 DIAGNOSIS — M545 Low back pain, unspecified: Secondary | ICD-10-CM | POA: Insufficient documentation

## 2012-11-13 HISTORY — DX: Depression, unspecified: F32.A

## 2012-11-13 LAB — URINE HCG QUALITATIVE: Urine HCG Qualitative: NEGATIVE

## 2012-11-13 MED ORDER — HYDROCODONE-ACETAMINOPHEN 5-325 MG PO TABS
1.0000 | ORAL_TABLET | Freq: Once | ORAL | Status: AC
Start: 2012-11-13 — End: 2012-11-13
  Administered 2012-11-13: 1 via ORAL
  Filled 2012-11-13: qty 1
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
## 2012-11-13 NOTE — Discharge Instructions (Signed)
Follow up with your primary care doctor - see referral as needed and with [facility].  Return to the ER for any new or worsening of symptoms.      Back Pain NOS    You have been seen for back pain.    Back pain can happen anywhere from the neck down to the low back. Back pain has many different causes. Some of the more common are: Bone pain, muscle strain, muscle spasm, pain from overuse, and pinched nerves. Other problems can cause what feels like back pain. But the pain is really coming from another organ. This could be a kidney infection. This can cause lower back pain.    The x-rays of your back showed no broken bones.    The doctor still does not know the exact cause of your pain. Your problem does not seem to be from a dangerous cause. It is safe for you to go home today.    Some things you can try to help your back feel better are:   Apply a warm damp washcloth to the back where you have pain for 20 minutes at a time, at least 4 times per day.    Have someone massage the sore parts of your back.    Don t do any heavy lifting or bending. You can go back to normal daily activities if they don t make the pain worse.   You can use anti-inflammatory pain medicine for your pain. This could be Ibuprofen (Advil or Motrin). You can buy these at most stores. Follow the directions on the package.    This pain may last for the next few days. If your pain gets better, you probably do not need to see a doctor. However, if your symptoms get worse or you have new symptoms, you should return here or go to the nearest Emergency Department.    Call your physician or go to the nearest Emergency Department if you your pain does not improve within 4 weeks or your pain is bad enough to seriously limit your normal activities.    YOU SHOULD SEEK MEDICAL ATTENTION IMMEDIATELY, EITHER HERE OR AT THE NEAREST EMERGENCY DEPARTMENT, IF ANY OF THE FOLLOWING OCCURS:   - You think the pain is coming from somewhere other than  your back. This can include chest pain. This is sometimes from angina (heart pains) or other dangerous causes.   You have shortness of breath, sweating, chest pain (or pressure, heaviness, indigestion, etc).   You have abdominal (belly) pain that goes through to your back.   Your arms and legs tingle or get numb (lose feeling).   Your arms or legs are weak.   You lose control of your bladder or bowels. If this were to happen, it may cause you to wet or soil yourself.    You have problems urinating (peeing).   You have fevers (a temperature of more than 100.75F or 38C).   Your pain gets worse.      Clinics: [employer] [facility] and Health Dept    COMMUNITY HEALTH CARE NETWORK OFFICES   The [facility] Network is a partnership of health professionals, physicians, hospitals and local government. It was formed to provide primary health services for low income, uninsured [place] residents who cannot afford primary medical care services for themselves and their families.    Applications are taken in person at the health centers. The list of enrollment requirements for the [facility] in [place]  and Spanish are available.     [facility]  [address]  [place], [place] [address]  Phone: [phone]   FAX: [phone]  TTY: [phone]  Hours:   Monday and Tuesday: 11:00 a.m. - 7:30 p.m.  Wednesday, Thursday, Friday: 8:00 a.m. - 4:30 p.m.     [facility]  [address], [address]  [place], [place] [address]  Phone: [phone]  FAX: [phone]  TTY: [phone]  Hours:   Monday and Tuesday: 11:00 a.m. - 7:30 p.m.  Wednesday, Thursday, Friday: 8:00 a.m. - 4:30 p.m.     [facility]  [address]  [place], [place] [address]  PHONE: [phone]   FAX: [phone]  TTY: [phone]    Monday and Tuesday: 11:00 a.m. - 7:30 p.m.  Wednesday,Thursday, Friday: 8:00 a.m. - 4:30  p.m.    --------------------------------------------------------  --------------------------------------------------------  DOCUMENTATION NEEDED FOR ENROLLMENT INTO  THE COMMUNITY HEALTH CARE NETWORK    Identification for [facility] family member: (Please bring ONE of the following)  - Social Security Card  - Scientist, forensic  - Baptismal Record  - School Report Card  - WIC Record  - Photo ID Card  If you are not the biological parent of one or more minors living in your home, please bring proof of adoption, guardianship or foster parent status.    Intent to remain in [facility] for [facility] family member: (Please bring IF applicable to you and/or any family member)  - Passport  - Visa  - INS documentation    Depending on your situation, this documentation may or may not be required. If you have a passport, visa or any documentation from the INS regarding your residency status, please bring it with you.  Proof of 73-Month Residence in [place]: (Please bring ONE of the following)   Barista with your name and address   Mortgage or tax bill   Letter from landlord   Letter from homeless shelter   Notarized statement from person with whom you are living PLUS a utility bill with the name and address of the person with whom you are living   Proof of Income: (for [employer] employed household member, for [employer] job held)   Most recent income tax return    AND one of the following:   Pay stubs for the past month   Income and Engineer, manufacturing systems form (provided by enrollment office)   Copy of social security and/or SSI award letter, General Relief check, proof of TANF payment, copy of pension payments, court order regarding alimony and/or child support payments to you   If you are currently unemployed and supported by a friend or family member, bring in a notarized statement from the person supporting you stating how long they have helped you and how long they plan to continue supporting  you.    Proof of Insurance: (for [employer] household member)   Proof that you are ineligible for Medicaid, Medallion or FAMIS   If employed, please have your employer complete the income and insurance verification form    Other Required Documentation:   Other: __________________________   Two (2) months of most recent bank statements, both checking and savings   Copies of on-going monthly bills, including rent/mortgage, utilities, phones, car payments, etc.      [facility] Referral    You have been referred to the [employer] for management of your back  problem. They have a personalized, comprehensive, multi-specialty approach as well as physical therapy centers, imaging centers and pain clinics. Please call them today or the next business day to arrange for follow-up care.    Phone: [phone]  Web: [email or web address]

## 2012-11-19 ENCOUNTER — Emergency Department
Admission: EM | Admit: 2012-11-19 | Discharge: 2012-11-19 | Disposition: A | Discharge status: Home / Self Care | Payer: Medicare Other | Attending: Emergency Medicine | Admitting: Emergency Medicine

## 2012-11-19 ENCOUNTER — Other Ambulatory Visit: Payer: Self-pay

## 2012-11-19 ENCOUNTER — Emergency Department: Payer: Medicare Other

## 2012-11-19 DIAGNOSIS — IMO0001 Reserved for inherently not codable concepts without codable children: Secondary | ICD-10-CM | POA: Insufficient documentation

## 2012-11-19 DIAGNOSIS — R102 Pelvic and perineal pain: Secondary | ICD-10-CM

## 2012-11-19 DIAGNOSIS — Z79899 Other long term (current) drug therapy: Secondary | ICD-10-CM | POA: Insufficient documentation

## 2012-11-19 DIAGNOSIS — N949 Unspecified condition associated with female genital organs and menstrual cycle: Secondary | ICD-10-CM | POA: Insufficient documentation

## 2012-11-19 DIAGNOSIS — M791 Myalgia, unspecified site: Secondary | ICD-10-CM

## 2012-11-19 DIAGNOSIS — J45909 Unspecified asthma, uncomplicated: Secondary | ICD-10-CM | POA: Insufficient documentation

## 2012-11-19 DIAGNOSIS — E119 Type 2 diabetes mellitus without complications: Secondary | ICD-10-CM | POA: Insufficient documentation

## 2012-11-19 LAB — COMPREHENSIVE METABOLIC PANEL
ALT: 10 U/L (ref 0–55)
AST (SGOT): 10 U/L (ref 5–34)
Albumin/Globulin Ratio: 0.8 — ABNORMAL LOW (ref 0.9–2.2)
Albumin: 3 g/dL — ABNORMAL LOW (ref 3.5–5.0)
Alkaline Phosphatase: 114 U/L (ref 40–150)
BUN: 8 mg/dL (ref 7.0–19.0)
Bilirubin, Total: 0.5 mg/dL (ref 0.2–1.2)
CO2: 24 (ref 22–29)
Calcium: 9.2 mg/dL (ref 8.5–10.5)
Chloride: 98 (ref 98–107)
Creatinine: 0.7 mg/dL (ref 0.6–1.0)
Globulin: 4 g/dL — ABNORMAL HIGH (ref 2.0–3.6)
Glucose: 288 mg/dL — ABNORMAL HIGH (ref 70–100)
Potassium: 4.1 (ref 3.5–5.1)
Protein, Total: 7 g/dL (ref 6.0–8.3)
Sodium: 133 — ABNORMAL LOW (ref 136–145)

## 2012-11-19 LAB — CBC AND DIFFERENTIAL
Basophils Absolute Automated: 0.02 (ref 0.00–0.20)
Basophils Automated: 0 %
Eosinophils Absolute Automated: 0.08 (ref 0.00–0.70)
Eosinophils Automated: 1 %
Hematocrit: 35.7 % — ABNORMAL LOW (ref 37.0–47.0)
Hgb: 12.2 g/dL (ref 12.0–16.0)
Immature Granulocytes Absolute: 0.04
Immature Granulocytes: 0 %
Lymphocytes Absolute Automated: 1.41 (ref 0.50–4.40)
Lymphocytes Automated: 10 %
MCH: 27.2 pg — ABNORMAL LOW (ref 28.0–32.0)
MCHC: 34.2 g/dL (ref 32.0–36.0)
MCV: 79.7 fL — ABNORMAL LOW (ref 80.0–100.0)
MPV: 9.9 fL (ref 9.4–12.3)
Monocytes Absolute Automated: 0.8 (ref 0.00–1.20)
Monocytes: 6 %
Neutrophils Absolute: 11.85 — ABNORMAL HIGH (ref 1.80–8.10)
Neutrophils: 84 %
Nucleated RBC: 0 (ref 0–1)
Platelets: 311 (ref 140–400)
RBC: 4.48 (ref 4.20–5.40)
RDW: 14 % (ref 12–15)
WBC: 14.2 — ABNORMAL HIGH (ref 3.50–10.80)

## 2012-11-19 LAB — GROUP A STREP, RAPID ANTIGEN: Group A Strep, Rapid Antigen: NEGATIVE

## 2012-11-19 LAB — GFR: EGFR: >60

## 2012-11-19 LAB — HCG QUANTITATIVE: hCG, Quant.: 1.2

## 2012-11-19 MED ORDER — ACETAMINOPHEN 500 MG PO TABS
1000.0000 mg | ORAL_TABLET | Freq: Once | ORAL | Status: AC
Start: 2012-11-19 — End: 2012-11-19
  Administered 2012-11-19: 1000 mg via ORAL
  Filled 2012-11-19: qty 2

## 2012-11-19 MED ORDER — SODIUM CHLORIDE 0.9 % IV BOLUS
1000.0000 mL | Freq: Once | INTRAVENOUS | Status: AC
Start: 2012-11-19 — End: 2012-11-19
  Administered 2012-11-19: 1000 mL via INTRAVENOUS

## 2012-11-19 MED ORDER — NAPROXEN 250 MG PO TABS
500.0000 mg | ORAL_TABLET | Freq: Two times a day (BID) | ORAL | 0 refills | Status: DC
Start: 2012-11-19 — End: 2013-07-16
  Filled 2012-11-19: qty 30, 8d supply, fill #0

## 2012-11-19 NOTE — ED Notes (Signed)
Bed:S 20<BR> Expected date:<BR> Expected time:<BR> Means of arrival:[facility] [place]<BR> Comments:<BR>

## 2012-11-19 NOTE — ED Notes (Signed)
Patient BIBA from home for abdominal pain and cramping increasing over past 24 hours. Reports sore throat and generalized body aches today. Possibly pregnant per patient, reports spotting over past month with last menstrual period 'end of march'

## 2012-11-19 NOTE — Discharge Instructions (Signed)
Please follow up with [facility] in 2-3 days.  Take Naprosyn as prescribed.    Myalgia    You have been diagnosed with muscle aches (myalgia).    Inflammation (irritation) of the muscles causes myalgias. This causes pain. Usually, this happens when the affected muscle is over-used or injured. Sometimes, the cause is a viral illness, or an autoimmune disease. There are many other possiblecauses.    One possible cause is due to a rare reaction to drugs called "statins." These drugs lower cholesterol. In rare cases, they cause muscle pain or even muscle breakdown. Symptoms include muscle aches, soreness, tenderness, or weakness.    Statins include some of the following medications:      Atorvastatin (Lipitor); Luvastatin (Lescol); lovastatin (Mevacor, Altoprev); pitavastatin (Livalo); pravastatin (Pravachol); rosuvastatin (Crestor); simvastatin (Zocor); others may become available.   Combination medications like simvastatin/ezetimibe (Vytorin).    Your doctor has decided, based on your exam today, that the cause of the muscle pains is not life-threatening or dangerous. Depending on the cause for your pain, you can expect your symptoms to get better over the next week. Sometimes the symptoms can last up to a few weeks.    We don't believe your condition is dangerous right now. However, you need to be careful. Sometimes a problem that seems small can get serious later. Therefore, it is very important for you to come back here or go to the nearest Emergency Department if you don t get better or your symptoms get worse.     Your doctor may prescribe you pain medications to treat yourpain. You can also use over-the-counter medicines like acetaminophen (Tylenol) or anti-inflammatory medicinelike ibuprofen (Advil, Motrin) or Naproxen (Aleve, Naprosyn). It is important to follow the directions for taking these medications.    Some things you can do to help your injury are: Resting, Icing,  Compressing and Elevating the injured area. Remember this as "RICE."     REST: Limit the use of the painful body part.      ICE: By applying ice to the affected area, swelling and pain can be reduced. Place some ice cubes in a re-sealable (Ziploc) bag and add some water. Put a thin washcloth between the bag and the skin. Apply the ice bag to the area for at least 20 minutes. Do this at least 4 times per day. It is okay to do this more often than directed. You can also do it for longer than directed. NEVER APPLY ICE DIRECTLY TO THE SKIN.      COMPRESS: Compression means to apply pressure around the painful area such as with a splint, cast or an ace bandage. Compression decreases swelling and improves comfort. Compression should be tight enough to relieve swelling but not so tight as to decrease circulation. Increasing pain, numbness, tingling, or change in skin color, are all signs of decreased circulation.      ELEVATE: Elevate the painful part.     When your pain starts to get better, you ll need to do gentle stretches with the injured muscle and work on increasing your range of motion. This will help your muscles from getting stiff and make the symptoms not last as long.    YOU SHOULD SEEK MEDICAL ATTENTION IMMEDIATELY, EITHER HERE OR AT THE NEAREST EMERGENCY DEPARTMENT, IF ANY OF THE FOLLOWING OCCUR:     Your symptoms haven t started to get better in 5-10 days.   You start to have severe pain in the affected body part or  the body part becomes pale, numb, and very firm to the touch.   Your urine (pee) is the wrong color. This can be a sign of muscle breakdown.    If you can t follow up with your doctor, or if at any time you feel you need to be rechecked or seen again, come back here or go to the nearest emergency department.    Pelvic Pain    You have been diagnosed with pelvic pain.    Pelvic pain affects many women who come here for evaluation. Your doctor has checked for the most dangerous  causes of pelvic pain, such as appendicitis, pelvic abscess ectopic (tubal) pregnancy, or other complications of pregnancy. You do not have any of these problems.    There are many other causes of pelvic pain that cannot be detected with the type of testing that can be done here today. Some diseases, such as uterine fibroids or endometriosis, may cause abnormal vaginal bleeding along with pain. These problems often require the doctor to look at the pelvic organs in the operating room under general anesthesia.    After examining you today and conducting a few tests, the doctor may suspect a specific cause of your pelvic pain, like infection of the uterus or fallopian tubes (known as pelvic inflammatory disease or PID). If so, treatment can be started immediately, even though it might take a few days to get all the test results back.    Cancer is a rare cause of acute pelvic pain, but it still needs to be considered as a possible cause of your symptoms. The emergency department or urgent care clinic is rarely able to diagnose or rule out cancer as the cause of pelvic pain. Cancer screening is not part of a routine emergency evaluation.    It is VERY IMPORTANT that you follow up with your regular doctor who can evaluate all possible causes of your pain.    At this time it is safe for you to go home.    You may need to return here or go to the nearest Emergency Department if you develop symptoms that might indicate you are having complications, such as worsening infection, vaginal bleeding, or some other problem.    Be sure to follow up with your gynecologist or regular doctor in the next few days. Tell your doctor about this visit.   If you do not have a regular doctor, make sure you tell the medical staff prior to leaving here, so that we can help make these arrangements for you.    YOU SHOULD SEEK MEDICAL ATTENTION IMMEDIATELY, EITHER HERE OR AT THE NEAREST EMERGENCY DEPARTMENT, IF ANY OF THE FOLLOWING  OCCURS:   Increasingly severe pain in the abdomen (belly), pelvis or back.   Increasingly large amounts of vaginal discharge or bleeding (more than one pad per hour), or passage of large blood clots.   Fever, chills, nausea, vomiting.   Dizziness, lightheadedness, or passing out.      Clinics: [facility] and Health Dept    COMMUNITY HEALTH CARE NETWORK OFFICES   The [facility] is a partnership of health professionals, physicians, hospitals and local government. It was formed to provide primary health services for low income, uninsured [place] residents who cannot afford primary medical care services for themselves and their families.    Applications are taken in person at the health centers. The list of enrollment requirements for the [facility] in [place] and [place]  are available.     [facility]  [address]  [place], [place] [address]  Phone: [phone]   FAX: [phone]  TTY: [phone]  Hours:   Monday and Tuesday: 11:00 a.m. - 7:30 p.m.  Wednesday, Thursday, Friday: 8:00 a.m. - 4:30 p.m.     [facility]  [address]  [place], [place] [address]  Phone: [phone]  FAX: [phone]  TTY: [phone]  Hours:   Monday and Tuesday: 11:00 a.m. - 7:30 p.m.  Wednesday, Thursday, Friday: 8:00 a.m. - 4:30 p.m.     [facility]  [address], [address]  [place], [place] [address]  PHONE: [phone]   FAX: [phone]  TTY: [phone]    Monday and Tuesday: 11:00 a.m. - 7:30 p.m.  Wednesday,Thursday, Friday: 8:00 a.m. - 4:30 p.m.    --------------------------------------------------------  --------------------------------------------------------  DOCUMENTATION NEEDED FOR ENROLLMENT INTO  THE COMMUNITY HEALTH CARE NETWORK    Identification for [facility] family member: (Please bring ONE of the following)  - Social Security Card  - Scientist, forensic  - Baptismal Record  - School Report Card  - WIC Record  -  Photo ID Card  If you are not the biological parent of one or more minors living in your home, please bring proof of adoption, guardianship or foster parent status.    Intent to remain in [facility] for [facility] family member: (Please bring IF applicable to you and/or any family member)  - Passport  - Visa  - INS documentation    Depending on your situation, this documentation may or may not be required. If you have a passport, visa or any documentation from the INS regarding your residency status, please bring it with you.  Proof of 85-Month Residence in [place]: (Please bring ONE of the following)   Barista with your name and address   Mortgage or tax bill   Letter from landlord   Letter from homeless shelter   Notarized statement from person with whom you are living PLUS a utility bill with the name and address of the person with whom you are living   Proof of Income: (for [employer] employed household member, for [employer] job held)   Most recent income tax return    AND one of the following:   Pay stubs for the past month   Income and Engineer, manufacturing systems form (provided by enrollment office)   Copy of social security and/or SSI award letter, General Relief check, proof of TANF payment, copy of pension payments, court order regarding alimony and/or child support payments to you   If you are currently unemployed and supported by a friend or family member, bring in a notarized statement from the person supporting you stating how long they have helped you and how long they plan to continue supporting you.    Proof of Insurance: (for [employer] household member)   Proof that you are ineligible for Medicaid, Medallion or FAMIS   If employed, please have your employer complete the income and insurance verification form    Other Required Documentation:   Other: __________________________   Two (2) months of most recent bank statements, both checking and savings   Copies of on-going monthly  bills, including rent/mortgage, utilities, phones, car payments, etc.

## 2012-11-19 NOTE — ED Provider Notes (Signed)
Physician/Midlevel provider first contact with patient: 11/19/12 0818             EMERGENCY DEPARTMENT HISTORY AND PHYSICAL EXAM    Date: 11/19/2012  Patient Name: WJXBJYN,WGNFAOZH Y  Attending Physician: Orlinda Blalock, MD    Diagnosis and Treatment Plan       Clinical Impression:   1. Myalgia    2. Pelvic pain in female        Treatment Plan:   ED Disposition     Discharge LYNETTE TOPETE discharge to home/self care.    Condition at discharge: Good            History of Presenting Illness     Chief Complaint   Patient presents with   . Abdominal Pain   . Generalized Body Aches       CORLEY KOHLS is a 37 y.o. female [phone] with PMHx of DM and anemia, BIBA who presents with persistent, gradually worsening abdominal pain with onset of 24 hours.    Pt states yesterday she was at church when she developed abdominal cramping, which progressively worsened overnight. Pt notes that she is currently pregnant - noted via home pregnancy test 4 months ago, but that she has had intermittent spotting over the past month. Also notes increased urinary frequency, dexi 277 by EMS en route. Was seen 5 days ago at [facility] for back pain and given Vicodin.    Denies any cough, rhinorrhea, chest pain, dysuria, or current vaginal bleeding.     Of note, pt's blood type confirmed A+ on 08/28/07.    Duration: 1 day  Onset: gradual  Quality: aching  Maximum Severity: moderate  Modifying Factors: minimal relief with rest  History Obtained From: patient    PCP: Mariana Single, MD    Past Medical History     Past Medical History   Diagnosis Date   . Diabetes mellitus without complication    . Asthma without status asthmaticus    . Anemia    . Depression      Past Surgical History   Procedure Date   . Cesarean section    . Cholecystectomy    . Tonsillectomy    . Hernia repair        Family History     No family history on file.    Social History     History     Social History   . Marital Status: Significant Other     Spouse Name: N/A      Number of Children: N/A   . Years of Education: N/A     Social History Main Topics   . Smoking status: Never Smoker    . Smokeless tobacco: Not on file   . Alcohol Use: No   . Drug Use: No   . Sexually Active: Not on file     Other Topics Concern   . Not on file     Social History Narrative   . No narrative on file       Allergies     Allergies   Allergen Reactions   . Percocet (Oxycodone-Acetaminophen)      Unknown reaction         Medications     Current facility-administered medications:[COMPLETED] acetaminophen (TYLENOL) tablet 1,000 mg, 1,000 mg, Oral, Once, Orlinda Blalock, MD, 1,000 mg at 11/19/12 0835;  [COMPLETED] sodium chloride 0.9 % bolus 1,000 mL, 1,000 mL, Intravenous, Once, Orlinda Blalock, MD, 1,000 mL at 11/19/12 0844  Current  outpatient prescriptions:sitaGLIPtin-metFORMIN (JANUMET) 50-1000 MG per tablet, Take 1 tablet by mouth 2 (two) times daily with meals., Disp: , Rfl: ;  lisinopril (PRINIVIL,ZESTRIL) 10 mg tablet, Take 10 mg by mouth daily., Disp: , Rfl: ;  metFORMIN (GLUCOPHAGE) 1000 MG tablet, Take 1,000 mg by mouth 2 (two) times daily with meals., Disp: , Rfl:   naproxen (NAPROSYN) 250 MG tablet, Take 2 tablets (500 mg total) by mouth 2 (two) times daily with meals., Disp: 30 tablet, Rfl: 0;  PROAIR HFA 108 (90 BASE) MCG/ACT inhaler, , Disp: , Rfl: ;  QVAR 80 MCG/ACT inhaler, , Disp: , Rfl:     Review of Systems     Review of Systems : All systems were reviewed and are negative for acute complaints, except as described above.     Physical Exam   BP 137/71  Pulse 101  Temp 96.4 F (35.8 C)  Resp 16  Ht 1.803 m  Wt 136.079 kg  BMI 41.86 kg/m2  SpO2 97%  LMP 10/06/2012  Constitutional: Patient is afebrile. Vital signs reviewed. Well appearing.   Head: Atraumatic. Normocephalic.   Eyes: Eyes are normal to inspection. Sclera are nl.   ENT: Mild pharyngeal erythema, no exudate, no swelling.  Respiratory/Chest: Breath sounds are normal. No respiratory distress.  Cardiovascular: RRR. Hearts  sounds normal. Normal S1 S2.  Abdomen: Left lateral and diffuse midline abdominal tenderness to palpation. No peritoneal signs.  Back: no midline tenderness to palp, no CVA tenderness to palp  Upper Extremity: Inspection normal. No cyanosis.  Lower Extremity: Inspection normal. No edema  Neuro: GCS is 15. Speech normal.  Skin: Skin is warm. Skin is dry.  Psychiatric: Oriented X 3. Normal affect.       Diagnostic Study Results     Labs -  Results     Procedure Component Value Units Date/Time    Beta HCG, Quant, Serum [[id number]] Collected:11/19/12 0839     hCG, Quant. 1.2 Updated:11/19/12 0931    Comprehensive metabolic panel [[id number]]  (Abnormal) Collected:11/19/12 0839    Specimen Information:Blood Updated:11/19/12 0924     Glucose 288 (H) mg/dL      BUN 8.0 mg/dL      Creatinine 0.7 mg/dL      Sodium 413 (L)      Potassium 4.1      Chloride 98      CO2 24      CALCIUM 9.2 mg/dL      Protein, Total 7.0 g/dL      Albumin 3.0 (L) g/dL      AST (SGOT) 10 U/L      ALT 10 U/L      Alkaline Phosphatase 114 U/L      Bilirubin, Total 0.5 mg/dL      Globulin 4.0 (H) g/dL      Albumin/Globulin Ratio 0.8 (L)     GFR [[id number]] Collected:11/19/12 0839     EGFR >60.0 Updated:11/19/12 0924    GROUP A STREP, RAPID ANTIGEN [[id number]] Collected:11/19/12 0839    Specimen Information:Throat Updated:11/19/12 0906     Group A Strep, Rapid Antigen Negative     CBC and differential [[id number]]  (Abnormal) Collected:11/19/12 0839    Specimen Information:Blood / Blood Updated:11/19/12 0902     WBC 14.20 (H)      RBC 4.48      Hgb 12.2 g/dL      Hematocrit 75.6 (L) %      MCV 79.7 (L) fL      MCH 27.2 (L)  pg      MCHC 34.2 g/dL      RDW 14 %      Platelets 311      MPV 9.9 fL      Neutrophils 84 %      Lymphocytes Automated 10 %      Monocytes 6 %      Eosinophils Automated 1 %      Basophils Automated 0 %      Immature Granulocyte 0 %      Nucleated RBC 0      Neutrophils Absolute 11.85 (H)      Abs Lymph Automated 1.41      Abs Mono  Automated 0.80      Abs Eos Automated 0.08      Absolute Baso Automated 0.02      Absolute Immature Granulocyte 0.04           Radiologic Studies -  US OB < 14 WEEKS W TRANSVAG    Final Result:      1. No intrauterine gestational sac is seen. Normal endometrial    thickness.    2. Uterine fibroids as described above.    3. Normal ovaries.        Jasmine December  D'Heureux, MD     11/19/2012 10:01 AM         Clinical Course in the Emergency Department       Discharge note:  Pt reevaluated prior to discharge. ambulating without difficulty. Tolerating po. Discharge instructions reviewed with the patient. Pt appears stable for discharge and has been instructed to follow up with PCP or return to the ED if their symptoms worsen or they have any concerns    Medical Decision Making     Amount and/or Complexity of Data Reviewed  First MD: Orlinda Blalock, MD  I am the first provider for this patient  I have reviewed the vital signs, past medical hx, past surgical hx, social hx, family hx.  Clinical lab tests ordered and reviewed by myself: yes  All lab results interpreted by myself: yes  All radiology ordered and reviewed by myself: yes  All radiologic studies interpreted by myself: yes  Independent visualization of images, tracings, or specimens: yes  Discuss the patient with other providers: yes  Obtained patient's old records: yes  Nursing notes reviewed: yes  O2 sat reviewed : normal  .     _______________________________    Attestations:    I was acting as a Neurosurgeon for [employer] on [employer] [facility]   Treatment Team: Scribe: Ala Bent      I am the first provider for this patient and I personally performed the services documented.  Treatment Team: Scribe: Ala Bent is scribing for me on [facility].This note accurately reflects work and decisions made by me.   Orlinda Blalock, MD        _______________________________                        Orlinda Blalock, MD  12/11/12 1031

## 2012-11-19 NOTE — Progress Notes (Signed)
Pt obtained 2 bus tokens for transport home.    Gwenevere Abbot, MSW, LCSW  Clinical Social Worker II  [phone]

## 2012-11-21 ENCOUNTER — Emergency Department
Admission: EM | Admit: 2012-11-21 | Discharge: 2012-11-21 | Disposition: A | Discharge status: Home / Self Care | Payer: Medicare Other | Attending: Emergency Medicine | Admitting: Emergency Medicine

## 2012-11-21 ENCOUNTER — Emergency Department: Payer: Medicare Other

## 2012-11-21 DIAGNOSIS — Z5320 Procedure and treatment not carried out because of patient's decision for unspecified reasons: Secondary | ICD-10-CM | POA: Insufficient documentation

## 2012-11-21 DIAGNOSIS — J019 Acute sinusitis, unspecified: Secondary | ICD-10-CM | POA: Insufficient documentation

## 2012-11-21 DIAGNOSIS — H109 Unspecified conjunctivitis: Secondary | ICD-10-CM | POA: Insufficient documentation

## 2012-11-21 LAB — POCT GLUCOSE: Whole Blood Glucose POCT: 271 mg/dL — AB (ref 70–100)

## 2012-11-21 LAB — POCT RAPID STREP A: Rapid Strep A Screen POCT: NEGATIVE

## 2012-11-21 MED ORDER — ACETAMINOPHEN-CODEINE #3 300-30 MG PO TABS
1.0000 | ORAL_TABLET | Freq: Once | ORAL | Status: AC
Start: 2012-11-21 — End: 2012-11-21
  Administered 2012-11-21: 1 via ORAL
  Filled 2012-11-21: qty 1

## 2012-11-21 MED ORDER — ACETAMINOPHEN-CODEINE #3 300-30 MG PO TABS
1.00 | ORAL_TABLET | Freq: Four times a day (QID) | ORAL | Status: DC | PRN
Start: 2012-11-21 — End: 2012-11-24

## 2012-11-21 MED ORDER — ERYTHROMYCIN 5 MG/GM OP OINT
TOPICAL_OINTMENT | Freq: Four times a day (QID) | OPHTHALMIC | Status: DC
Start: 2012-11-21 — End: 2012-11-21
  Filled 2012-11-21: qty 3.5

## 2012-11-21 MED ORDER — DIPHENHYDRAMINE HCL 25 MG PO CAPS
25.0000 mg | ORAL_CAPSULE | Freq: Once | ORAL | Status: AC
Start: 2012-11-21 — End: 2012-11-21
  Administered 2012-11-21: 25 mg via ORAL
  Filled 2012-11-21: qty 1

## 2012-11-21 MED ORDER — LORATADINE-PSEUDOEPHEDRINE ER 10-240 MG PO TB24
1.00 | ORAL_TABLET | Freq: Every day | ORAL | Status: DC
Start: 2012-11-21 — End: 2012-11-23

## 2012-11-21 MED ORDER — FLUTICASONE PROPIONATE 50 MCG/ACT NA SUSP
1.00 | Freq: Two times a day (BID) | NASAL | Status: DC
Start: 2012-11-21 — End: 2012-11-23

## 2012-11-21 NOTE — Discharge Instructions (Signed)
Use the eye ointment 4 times daily to the left lower eyelid for 7 days.  Take all other medications as prescribed.

## 2012-11-21 NOTE — ED Notes (Addendum)
Pt arrives via ems - c/o sore throat & generalized body aches. Also reports drainage in left eye & productive cough. Pt was seen at [facility] on 11/17/12 for the same reason - was prescribed naproxen but reports no relief. Denies cp, n/v/d, fever.

## 2012-11-21 NOTE — ED Provider Notes (Signed)
Physician/Midlevel provider first contact with patient: 11/21/12 0510         Pt brought in by EMS for sore throat.  Pt has had sore throat for the past 2 days.  Pt was at [facility] and had negative strep test done.  Pt states that her left eye has been itchy.  Pt c/o some itchiness in her left ear as well.  Pt coughing up some sputum at times.  Pt has been taking naproxen for pain.  Hx from the pt.    Review of Systems   Constitutional: Negative.    HENT: Positive for ear pain and sore throat.    Eyes: Positive for discharge and redness.   Respiratory: Positive for cough and sputum production.    Cardiovascular: Negative.    Gastrointestinal: Negative.    Skin: Negative.    All other systems reviewed and are negative.    Physical Exam   Nursing note and vitals reviewed.  Constitutional: She is oriented to person, place, and time. She appears distressed.   HENT:   Head: Normocephalic.   Right Ear: External ear normal.   Left Ear: External ear normal.   Mouth/Throat: Oropharynx is clear and moist.   Eyes: EOM are normal. Pupils are equal, round, and reactive to light.        Left sclera injected; green occular discharge from left eye   Cardiovascular: Normal rate and regular rhythm.    Pulmonary/Chest: Effort normal and breath sounds normal.   Musculoskeletal: Normal range of motion.   Neurological: She is alert and oriented to person, place, and time. GCS score is 15.   Skin: Skin is warm and dry. She is not diaphoretic.       Oxygen saturations are 100%    5:34 AM  Discussed results and tx plan.  Likely sinusitis.  Will d/c.    Impression:  1. Acute sinusitis    2. Conjunctivitis          Jethro Bastos, MD  11/21/12 [phone]

## 2012-11-23 ENCOUNTER — Emergency Department: Payer: Medicare Other

## 2012-11-23 ENCOUNTER — Emergency Department
Admission: EM | Admit: 2012-11-23 | Discharge: 2012-11-24 | Disposition: A | Discharge status: Home / Self Care | Payer: Medicare Other | Attending: Emergency Medicine | Admitting: Emergency Medicine

## 2012-11-23 DIAGNOSIS — E119 Type 2 diabetes mellitus without complications: Secondary | ICD-10-CM | POA: Insufficient documentation

## 2012-11-23 DIAGNOSIS — A6000 Herpesviral infection of urogenital system, unspecified: Secondary | ICD-10-CM

## 2012-11-23 DIAGNOSIS — N949 Unspecified condition associated with female genital organs and menstrual cycle: Secondary | ICD-10-CM | POA: Insufficient documentation

## 2012-11-23 DIAGNOSIS — N390 Urinary tract infection, site not specified: Secondary | ICD-10-CM

## 2012-11-23 DIAGNOSIS — R739 Hyperglycemia, unspecified: Secondary | ICD-10-CM

## 2012-11-23 DIAGNOSIS — B3731 Acute candidiasis of vulva and vagina: Secondary | ICD-10-CM | POA: Insufficient documentation

## 2012-11-23 DIAGNOSIS — B356 Tinea cruris: Secondary | ICD-10-CM | POA: Insufficient documentation

## 2012-11-23 DIAGNOSIS — D649 Anemia, unspecified: Secondary | ICD-10-CM | POA: Insufficient documentation

## 2012-11-23 DIAGNOSIS — J45909 Unspecified asthma, uncomplicated: Secondary | ICD-10-CM | POA: Insufficient documentation

## 2012-11-23 DIAGNOSIS — B379 Candidiasis, unspecified: Secondary | ICD-10-CM

## 2012-11-23 DIAGNOSIS — B373 Candidiasis of vulva and vagina: Secondary | ICD-10-CM | POA: Insufficient documentation

## 2012-11-23 LAB — POCT PREGNANCY TEST, URINE HCG: POCT Pregnancy HCG Test, UR: NEGATIVE

## 2012-11-23 MED ORDER — HYDROCODONE-ACETAMINOPHEN 5-325 MG PO TABS
1.0000 | ORAL_TABLET | Freq: Once | ORAL | Status: AC
Start: 2012-11-23 — End: 2012-11-24
  Administered 2012-11-24: 1 via ORAL
  Filled 2012-11-23: qty 1

## 2012-11-23 MED ORDER — IBUPROFEN 400 MG PO TABS
800.0000 mg | ORAL_TABLET | Freq: Once | ORAL | Status: AC
Start: 2012-11-23 — End: 2012-11-24
  Administered 2012-11-24: 800 mg via ORAL
  Filled 2012-11-23: qty 2

## 2012-11-23 NOTE — ED Notes (Signed)
Pt. C/o vaginal pain and discharge since Tuesday. Pt. Had vaginal sonogram last week for hernia.

## 2012-11-23 NOTE — ED Notes (Signed)
Bed:BL20<BR> Expected date:<BR> Expected time:<BR> Means of arrival:<BR> Comments:<BR>

## 2012-11-23 NOTE — ED Provider Notes (Signed)
EMERGENCY DEPARTMENT HISTORY AND PHYSICAL EXAM     Physician/Midlevel provider first contact with patient: 11/23/12 2329         Date: 11/23/2012  Patient Name: Dorothy [name]    History of Presenting Illness     Chief Complaint   Patient presents with   . Vaginal Pain       History Provided By: Pt    Chief Complaint: Vaginal Pain  Onset: ~3 days ago  Timing: Constant  Location: Vagina  Severity: Moderate  Modifying Factors: None   Associated Symptoms: Dysuria    Additional History: Dorothy [name] is a 37 y.o. female c/o vaginal pain for ~3 days. Pt said that when she noticed the pain she asked her husband to check it out and he noticed swelling. She also complains of dysuria saying that she has pain with urination. Pt denies nausea, vomiting, fever, cough, and leg swelling. Pt has a Hx of DM and is on medication that she says she took today. She says that she did not take any medication for pain today.     PCP: Mariana Single, MD      Current Facility-Administered Medications   Medication Dose Route Frequency Provider Last Rate Last Dose   . fluconazole (DIFLUCAN) tablet 150 mg  150 mg Oral Once Lorenza Cambridge, MD       . HYDROcodone-acetaminophen (NORCO) 5-325 MG per tablet 1 tablet  1 tablet Oral Once Lorenza Cambridge, MD       . ibuprofen (ADVIL,MOTRIN) tablet 800 mg  800 mg Oral Once Lorenza Cambridge, MD       . valACYclovir HCL (VALTREX) tablet 1,000 mg  1,000 mg Oral Once Lorenza Cambridge, MD         Current Outpatient Prescriptions   Medication Sig Dispense Refill   . HYDROcodone-acetaminophen (NORCO) 5-325 MG per tablet Take 1-2 tablets by mouth every 6 (six) hours as needed for Pain.  15 tablet  0   . naproxen (NAPROSYN) 250 MG tablet Take 2 tablets (500 mg total) by mouth 2 (two) times daily with meals.  30 tablet  0   . sitaGLIPtin-metFORMIN (JANUMET) 50-1000 MG per tablet Take 1 tablet by mouth 2 (two) times daily with meals.       . valACYclovir HCL (VALTREX) 500 MG tablet Take 2 tablets (1,000  mg total) by mouth 2 (two) times daily.  28 tablet  0   . [DISCONTINUED] acetaminophen-codeine (TYLENOL #3) 300-30 MG per tablet Take 1 tablet by mouth every 6 (six) hours as needed for Pain.  10 tablet  0       Past History     Past Medical History:  Past Medical History   Diagnosis Date   . Diabetes mellitus without complication    . Asthma without status asthmaticus    . Anemia    . Depression        Past Surgical History:  Past Surgical History   Procedure Date   . Cesarean section    . Cholecystectomy    . Tonsillectomy    . Hernia repair        Family History:  History reviewed. No pertinent family history.    Social History:  History   Substance Use Topics   . Smoking status: Never Smoker    . Smokeless tobacco: Not on file   . Alcohol Use: No       Allergies:  Allergies   Allergen Reactions   .  Percocet (Oxycodone-Acetaminophen)      Unknown reaction         Review of Systems     Review of Systems   Constitutional: Negative for fever.   Respiratory: Negative for cough.    Cardiovascular: Negative for leg swelling.   Gastrointestinal: Negative for nausea and vomiting.   Genitourinary: Positive for dysuria.        Positive for vaginal pain.    Psychiatric/Behavioral: Positive for depression.         Physical Exam   BP 151/75  Pulse 87  Temp 96.9 F (36.1 C)  Resp 20  SpO2 98%  LMP 10/06/2012    Physical Exam   Nursing note and vitals reviewed.  Constitutional:        Appears uncomfortable, in distress from pain when ambulatory   HENT:   Head: Normocephalic and atraumatic.   Mouth/Throat: Oropharynx is clear and moist.   Eyes: Conjunctivae normal are normal. Pupils are equal, round, and reactive to light.   Cardiovascular: Normal rate, regular rhythm and normal heart sounds.    No murmur heard.  Pulmonary/Chest: Effort normal and breath sounds normal. No respiratory distress.   Abdominal: Soft. She exhibits no distension. There is tenderness (mild suprapubic ttp).   Genitourinary: Vulva exhibits erythema,  rash and tenderness.        NEFG  Labia edematous.  Groin diffusely erythematous and appears swollen  Ulcers noted R labia markedly tender to palpation  White discharge noted in creases of groin bilaterally  Pt unable to tolerate speculum exam due to pain   Musculoskeletal: Normal range of motion. She exhibits no edema.   Lymphadenopathy:     She has no cervical adenopathy.   Neurological: She is alert. GCS score is 15.   Skin: Skin is warm and dry.         Diagnostic Study Results     Labs -     Results     Procedure Component Value Units Date/Time    UA, Reflex to Microscopic [[id number]]  (Abnormal) Collected:11/23/12 2345    Specimen Information:Urine Updated:11/24/12 0008     Urine Type Clean Catch      Color, UA Yellow      Clarity, UA Hazy      Specific Gravity UA 1.033      Urine pH 6.0      Leukocyte Esterase, UA Large (A)      Nitrite, UA Negative      Protein, UA Negative      Glucose, UA 500 (A)      Ketones UA Trace (A)      Urobilinogen, UA Negative mg/dL      Bilirubin, UA Negative      Blood, UA Negative      RBC, UA 11 - 25 (A)      WBC, UA 26 - 50 (A)      Squamous Epithelial Cells, Urine 0 - 5      Urine Bacteria Occasional (A)     Accucheck [[id number]]  (Abnormal) Collected:11/24/12 2356     POCT Glucose WB 312 (A) mg/dL HYQMVHQ:46/96/29 5284    Urine HCG POC [[id number]] Collected:11/23/12 2342     POCT QC Pass Updated:11/23/12 2347     POCT Pregnancy HCG Test, UR Negative      Comment:        Result:     Negative Value is Normal in Healthy Males or Healthy non-pregnant Females  Radiologic Studies -   Radiology Results (24 Hour)     ** No Results found for the last 24 hours. **      .      Medical Decision Making   I am the first provider for this patient.    I reviewed the vital signs, available nursing notes, past medical history, past surgical history, family history and social history.    Vital Signs-Reviewed the patient's vital signs.     Patient Vitals for the past 12 hrs:   BP Temp  Pulse Resp   11/23/12 2318 151/75 mmHg 96.9 F (36.1 C) 87  20        Pulse Oximetry Analysis - Normal 98% on RA    Old Medical Records: Nursing notes.     ED Course:   12:26 AM - Counseled on diagnosis, f/u plans, and signs and symptoms when to return to ED.  Pt is stable and ready for discharge.      Provider Notes: Pt's ulcerations appear c/w genital herpes. She was informed of this tentative dx. Erythema and swelling appear c/w tinea especially in light of pt's DM.    Procedures:    Core Measures:    Critical Care Time:     Diagnosis     Clinical Impression:   1. Tinea cruris    2. Yeast infection    3. Genital herpes    4. Hyperglycemia        _______________________________    Attestations:  This note is prepared by [employer], acting as Scribe for Chrissie Noa, MD.    Chrissie Noa, MD.  The scribe's documentation has been prepared under my direction and personally reviewed by me in its entirety.  I confirm that the note above accurately reflects all work, treatment, procedures, and medical decision making performed by me.      _______________________________          Lorenza Cambridge, MD  11/27/12 [phone]

## 2012-11-24 ENCOUNTER — Emergency Department
Admission: EM | Admit: 2012-11-24 | Discharge: 2012-11-24 | Disposition: A | Discharge status: Home / Self Care | Payer: Medicare Other | Source: Home / Self Care | Attending: Emergency Medicine | Admitting: Emergency Medicine

## 2012-11-24 ENCOUNTER — Emergency Department: Payer: Medicare Other

## 2012-11-24 LAB — URINALYSIS, REFLEX TO MICROSCOPIC EXAM IF INDICATED
Bilirubin, UA: NEGATIVE
Blood, UA: NEGATIVE
Glucose, UA: 500 — AB
Nitrite, UA: NEGATIVE
Protein, UR: NEGATIVE
Specific Gravity UA: 1.033 (ref 1.001–1.035)
Urine pH: 6 (ref 5.0–8.0)
Urobilinogen, UA: NEGATIVE mg/dL

## 2012-11-24 LAB — POCT GLUCOSE
Whole Blood Glucose POCT: 295 mg/dL — AB (ref 70–100)
Whole Blood Glucose POCT: 312 mg/dL — AB (ref 70–100)

## 2012-11-24 MED ORDER — NITROFURANTOIN MONOHYD MACRO 100 MG PO CAPS
100.0000 mg | ORAL_CAPSULE | Freq: Two times a day (BID) | ORAL | Status: AC
Start: 2012-11-24 — End: 2012-12-01

## 2012-11-24 MED ORDER — FLUCONAZOLE 100 MG PO TABS
150.0000 mg | ORAL_TABLET | Freq: Once | ORAL | Status: AC
Start: 2012-11-24 — End: 2012-11-24
  Administered 2012-11-24: 150 mg via ORAL
  Filled 2012-11-24: qty 2

## 2012-11-24 MED ORDER — VALACYCLOVIR HCL 500 MG PO TABS
1000.00 mg | ORAL_TABLET | Freq: Two times a day (BID) | ORAL | Status: AC
Start: 2012-11-24 — End: 2012-12-01

## 2012-11-24 MED ORDER — DIPHENHYDRAMINE HCL 25 MG PO CAPS
50.0000 mg | ORAL_CAPSULE | Freq: Once | ORAL | Status: AC
Start: 2012-11-24 — End: 2012-11-24
  Administered 2012-11-24: 50 mg via ORAL
  Filled 2012-11-24: qty 2

## 2012-11-24 MED ORDER — VALACYCLOVIR HCL 500 MG PO TABS
1000.0000 mg | ORAL_TABLET | Freq: Once | ORAL | Status: AC
Start: 2012-11-24 — End: 2012-11-24
  Administered 2012-11-24: 1000 mg via ORAL
  Filled 2012-11-24: qty 2

## 2012-11-24 MED ORDER — HYDROCODONE-ACETAMINOPHEN 5-325 MG PO TABS
2.0000 | ORAL_TABLET | Freq: Once | ORAL | Status: AC
Start: 2012-11-24 — End: 2012-11-24
  Administered 2012-11-24: 2 via ORAL
  Filled 2012-11-24: qty 2

## 2012-11-24 MED ORDER — IBUPROFEN 400 MG PO TABS
800.0000 mg | ORAL_TABLET | Freq: Once | ORAL | Status: AC
Start: 2012-11-24 — End: 2012-11-24
  Administered 2012-11-24: 800 mg via ORAL
  Filled 2012-11-24: qty 2

## 2012-11-24 MED ORDER — NITROFURANTOIN MONOHYD MACRO 100 MG PO CAPS
100.0000 mg | ORAL_CAPSULE | Freq: Once | ORAL | Status: AC
Start: 2012-11-24 — End: 2012-11-24
  Administered 2012-11-24: 100 mg via ORAL
  Filled 2012-11-24: qty 1

## 2012-11-24 MED ORDER — HYDROCODONE-ACETAMINOPHEN 5-325 MG PO TABS
1.0000 | ORAL_TABLET | Freq: Four times a day (QID) | ORAL | Status: DC | PRN
Start: 2012-11-24 — End: 2013-07-16

## 2012-11-24 NOTE — ED Provider Notes (Signed)
EMERGENCY DEPARTMENT HISTORY AND PHYSICAL EXAM    Date Time: 11/24/2012 8:56 PM  Patient Name: Dorothy [name]  Attending Physician: Westley Foots, *  Mid-Level: [facility] Huntleigh Doolen    History of Presenting Illness:   Dorothy [name] is a 37 [name] female   Chief Complaint: vaginal pain  History obtained from: Patient.  Onset/Duration: over last 5 days  Quality: swollen  Severity:severe  Aggravating Factors: none  Alleviating Factors: none  Associated Symptoms: none   Narrative/Additional Historical Findings: pt states that she is here to be admitted.  Pt states that her vagina is swollen and painful.  Pt states that it hurts to walk.  Pt states that she was here last night and nothing was done for her.  Pt reports that she thinks the sono tech last week purposely used a latex condom and she is having a reaction to it.  Pt told attending that she feels she is having a reaction to gas.    PCP: Mariana Single, MD    Past Medical History:     Past Medical History   Diagnosis Date   . Diabetes mellitus without complication    . Asthma without status asthmaticus    . Anemia    . Depression        Past Surgical History:     Past Surgical History   Procedure Date   . Cesarean section    . Cholecystectomy    . Tonsillectomy    . Hernia repair        Family History:   No family history on file.    Social History:     History     Social History   . Marital Status: Significant Other     Spouse Name: N/A     Number of Children: N/A   . Years of Education: N/A     Social History Main Topics   . Smoking status: Never Smoker    . Smokeless tobacco: Not on file   . Alcohol Use: No   . Drug Use: No   . Sexually Active: Not on file     Other Topics Concern   . Not on file     Social History Narrative   . No narrative on file       Allergies:     Allergies   Allergen Reactions   . Percocet (Oxycodone-Acetaminophen)      Unknown reaction         Medications:     Patient's Medications   New Prescriptions    No  medications on file   Previous Medications    HYDROCODONE-ACETAMINOPHEN (NORCO) 5-325 MG PER TABLET    Take 1-2 tablets by mouth every 6 (six) hours as needed for Pain.    NAPROXEN (NAPROSYN) 250 MG TABLET    Take 2 tablets (500 mg total) by mouth 2 (two) times daily with meals.    NITROFURANTOIN, MACROCRYSTAL-MONOHYDRATE, (MACROBID) 100 MG CAPSULE    Take 1 capsule (100 mg total) by mouth 2 (two) times daily.    SITAGLIPTIN-METFORMIN (JANUMET) 50-1000 MG PER TABLET    Take 1 tablet by mouth 2 (two) times daily with meals.    VALACYCLOVIR HCL (VALTREX) 500 MG TABLET    Take 2 tablets (1,000 mg total) by mouth 2 (two) times daily.   Modified Medications    No medications on file   Discontinued Medications    No medications on file       Review  of Systems:     Constitutional: No fever or chills.  Eyes: No discharge.   ENT: No ear pain or sore throat.  Cardiovascular: No chest pain or palpitations.  Respiratory: No cough or shortness of breath.  GI: No nausea, vomiting or diarrhea. No abdominal pain.  Genitourinary: No dysuria, hematuria or frequency.  Musculoskeletal: No neck or back pain.  Skin:No rash or skin lesions.  Neurologic: No headache or dizziness.  Psychiatric: No substance abuse.    Physical Exam:     Filed Vitals:    11/24/12 2009   BP: 145/95   Pulse: 86   Temp: 97.6 F (36.4 C)   Resp: 20   SpO2: 99%       Constitutional: Vital signs reviewed.   Head: Normocephalic, atraumatic  Eyes: No conjunctival injection. No discharge.  ENT: Mucous membranes moist  Neck: Normal range of motion. Non-tender.  Respiratory/Chest: Clear to auscultation. No respiratory distress.   Cardiovascular: Regular rate and rhythm. No murmur.   Abdomen: Soft and non-tender. No guarding. No masses or hepatosplenomegaly.  Genitourinary: minimally swollen labia noted.  There are numerous sores noted on labia.   There is no redness, crepitus or fluctuance noted to vulva.   Pt is refusing full pelvic.    Back: No CVA tenderness. No  midline tenderness.  UpperExtremity: Grossly normal ROM. 2+ radial pulses.  LowerExtremity: No edema. No cyanosis.  Neurological: No focal motor deficits by observation. Speech normal. Memory normal.  Skin: Warm and dry. No rash.  Lymphatic: No cervical lymphadenopathy.  Psychiatric: agitiated affect. Normal concentration.    Labs:     Results     ** No Results found for the last 24 hours. **          Rads:     Radiology Results (24 Hour)     ** No Results found for the last 24 hours. **          Procedures:       Assessment/Plan:   Pt is refusing pelvic.  Just wants admission for her pain.  Says that we won't do anything for her.  I have patiently explained to pt that we would need to do a pelvic for further eval. She still refused, but would just like to be admitted.    No further testing warranted in ed.  Chart reviewed from yesterday, unchanged from today.    No diagnosis found.    Signed by: Toney Sang, PA-C          Henry Russel [place], [place]  11/24/12 2137

## 2012-11-24 NOTE — ED Notes (Signed)
Pt BIBA from shelter c/o vaginal pain - seen here last night for the same - seen by EMS walking around prior to being picked up then refused to walk telling EMS she can't walk because of pain - claims she is pregnant with triplets though test last night showed negative for pregnancy.

## 2012-11-24 NOTE — Discharge Instructions (Signed)
Pelvic Pain     You have been diagnosed with pelvic pain.     Pelvic pain affects many women who come here for evaluation. Your doctor has checked for the most dangerous causes of pelvic pain, such as appendicitis, pelvic abscess ectopic (tubal) pregnancy, or other complications of pregnancy. You do not have any of these problems.     There are many other causes of pelvic pain that cannot be detected with the type of testing that can be done here today. Some diseases, such as uterine fibroids or endometriosis, may cause abnormal vaginal bleeding along with pain. These problems often require the doctor to look at the pelvic organs in the operating room under general anesthesia.     After examining you today and conducting a few tests, the doctor may suspect a specific cause of your pelvic pain, like infection of the uterus or fallopian tubes (known as pelvic inflammatory disease or PID). If so, treatment can be started immediately, even though it might take a few days to get all the test results back.     Cancer is a rare cause of acute pelvic pain, but it still needs to be considered as a possible cause of your symptoms. The emergency department or urgent care clinic is rarely able to diagnose or rule out cancer as the cause of pelvic pain. Cancer screening is not part of a routine emergency evaluation.     It is VERY IMPORTANT that you follow up with your regular doctor who can evaluate all possible causes of your pain.     At this time it is safe for you to go home.     You may need to return here or go to the nearest Emergency Department if you develop symptoms that might indicate you are having complications, such as worsening infection, vaginal bleeding, or some other problem.     Be sure to follow up with your gynecologist or regular doctor in the next few days. Tell your doctor about this visit.  · If you do not have a regular doctor, make sure you tell the medical staff prior to leaving here, so that we can  help make these arrangements for you.     YOU SHOULD SEEK MEDICAL ATTENTION IMMEDIATELY, EITHER HERE OR AT THE NEAREST EMERGENCY DEPARTMENT, IF ANY OF THE FOLLOWING OCCURS:  · Increasingly severe pain in the abdomen (belly), pelvis or back.  · Increasingly large amounts of vaginal discharge or bleeding (more than one pad per hour), or passage of large blood clots.  · Fever, chills, nausea, vomiting.  · Dizziness, lightheadedness, or passing out.

## 2012-11-24 NOTE — Discharge Instructions (Signed)
Apply lotrimin cream, which is over the counter, to affected area specifically creases of groin. Keep area clean and dry. Wash with warm water and soap at least twice a day.  Take pain medication as prescribed. Take valtrex as prescribed for possible genital herpes. Follow up with your doctor in 3 days.       Tinea Cruris    You have been diagnosed with tinea cruris, also known as "jock itch."    This condition is caused by an infection of the skin in the groin caused by a fungus. Although this condition is more common in men, it can also be seen in women. This condition causes red, scaling patches of itchy skin. It appears that some people are susceptible to getting this type of fungal infection, and repeat infections are common. It can be spread between susceptible people by sharing towels and underwear.    The general treatment for this type of infection includes the use of an antifungal medication, along with a topical (on the skin) steroid or oral (by mouth) anti-itching medicine such as Benadryl (diphenhydramine). You should use the medication as directed. If you develop a similar infection again, begin to use the medication as soon as you first notice the symptoms.    Try not to touch the skin lesions except to apply the ointment. Wash your hands with soap and water before and after applying the medication.    You should try to keep the affected area dry, because excessive sweat and moisture promotes the growth of the fungus.    YOU SHOULD SEEK MEDICAL ATTENTION IMMEDIATELY, EITHER HERE OR AT THE NEAREST EMERGENCY DEPARTMENT, IF ANY OF THE FOLLOWING OCCURS:   The symptoms become worse despite treatment.   You develop signs of infection at the affected areas (redness, pus, pain, swelling or fever).      Hyperglycemia    During your visit today, your blood sugar was found to be high.    The medical term for high blood sugar is hyperglycemia. This may be a one-time event, but it could mean that you  have diabetes. Diabetes is a serious illness and if it is left untreated, it can lead to heart problems, kidney problems (including kidney failure), stroke, or blindness. It is very important that you follow up with your regular doctor for a recheck of your blood sugar.    Tell your regular doctor that your blood sugar was high today. Your doctor will want to recheck your blood and possibly order other lab tests. If your doctor finds that you have diabetes, you will need medication and a special diet to control your blood sugar.    YOU SHOULD SEEK MEDICAL ATTENTION IMMEDIATELY, EITHER HERE OR AT THE NEAREST EMERGENCY DEPARTMENT, IF ANY OF THE FOLLOWING OCCURS:   Confusion or lethargy (very sleepy and hard to wake up).   Signs of dehydration, such as decreased urination, dry mouth, extreme fatigue, lightheadedness, or fainting.   Persistent vomiting.   Fever (temperature of over 100.5 F.) or shaking chills.   Abdominal (belly) pain or vomiting.      Herpes Genitalis    You have been diagnosed with genital herpes.    Herpes simplex is a common contagious infection caused by a virus. Herpes can infect the mouth and face or the genital area. Genital herpes is a sexually transmitted infection that is passed from person to person during vaginal or anal intercourse (sex). It can also be transmitted during oral sex. Once you have  been infected with herpes, you will always have herpes, even if you don't have any symptoms. Herpes sometimes causes painful sores that look like small blisters. Herpes is often spread when a person who has these sores has intimate contact (like kissing or sex) with a person who does not have herpes. You are most contagious when you have open sores, but It is still possible to spread herpes even if you do not have any sores or other symptoms.    Herpes sores develop 2-30 days after exposure to the herpes virus. Other symptoms include tender lymph nodes (lumps in the groin) and sometimes  fever, headache or muscle aches. Symptoms usually last 1-2 weeks and improve without treatment. If you get treatment soon enough, the symptoms may go away faster.    Treatment includes antiviral medication and with medicine for pain. If used early enough in the course of a herpes outbreak, medications can shorten the course of the infection and lessen the amount of pain.    You should not have sex until your lesions have completely healed. You should still use condoms with sex to protect your partner. Even if your herpes rash has cleared up, you can still be contagious and spread herpes by having sex. If you partner develops any symptoms, he or she should see a doctor right away.    YOU SHOULD SEEK MEDICAL ATTENTION IMMEDIATELY, EITHER HERE OR AT THE NEAREST EMERGENCY DEPARTMENT, IF ANY OF THE FOLLOWING OCCURS:   You develop a headache, stiff neck, confusion, or light sensitivity.   Your lesions become more red or painful or appear to be infected. Signs of infection include redness, swelling, fever, pain and/or pus drainage.   You develop enough swelling and pain that make it difficult to urinate (pee).   For any other new symptoms or concerns.

## 2012-11-28 NOTE — ED Provider Notes (Signed)
I have reviewed the notes, assessments, and/or procedures performed by PA Henry Russel, I concur with her documentation of Dorothy [name].    I have seen and talked with patient.  Attempted examination but she was uncooperative with any attempt at examination.  Patient wanting bus token to get home.    Patient is considering visiting another ED.  She is frustrated but will not allow any definitive examination.  She understands this limits our ability to care for her.  Patient doesn't meet EMTALA requirements for transfer to another facility and I have explained this to her. She asked to speak with administration and our Nurse Manager did speak with her.      Westley Foots, MD  11/28/12 [phone]

## 2012-12-02 ENCOUNTER — Emergency Department
Admission: EM | Admit: 2012-12-02 | Discharge: 2012-12-02 | Disposition: A | Discharge status: Home / Self Care | Payer: Medicare Other | Attending: Emergency Medicine | Admitting: Emergency Medicine

## 2012-12-02 ENCOUNTER — Emergency Department: Payer: Medicare Other

## 2012-12-02 DIAGNOSIS — R1032 Left lower quadrant pain: Secondary | ICD-10-CM | POA: Insufficient documentation

## 2012-12-02 DIAGNOSIS — R42 Dizziness and giddiness: Secondary | ICD-10-CM | POA: Insufficient documentation

## 2012-12-02 DIAGNOSIS — E119 Type 2 diabetes mellitus without complications: Secondary | ICD-10-CM | POA: Insufficient documentation

## 2012-12-02 LAB — POCT PREGNANCY TEST, URINE HCG: POCT Pregnancy HCG Test, UR: NEGATIVE

## 2012-12-02 LAB — POCT URINALYSIS AUTOMATED (IAH)
Bilirubin, UA POCT: NEGATIVE
Blood, UA POCT: NEGATIVE
Glucose, UA POCT: NEGATIVE
Ketones, UA POCT: NEGATIVE mg/dL
Nitrite, UA POCT: NEGATIVE
PH, UA POCT: 7 (ref 4.6–8)
Protein, UA POCT: NEGATIVE mg/dL
Specific Gravity, UA POCT: 1.02 mg/dL (ref 1.001–1.035)
Urobilinogen, UA POCT: 0.2 mg/dL

## 2012-12-02 LAB — POCT GLUCOSE: Whole Blood Glucose POCT: 149 mg/dL — AB (ref 70–100)

## 2012-12-02 MED ORDER — KETOROLAC TROMETHAMINE 30 MG/ML IJ SOLN
30.0000 mg | Freq: Once | INTRAMUSCULAR | Status: AC
Start: 2012-12-02 — End: 2012-12-02
  Administered 2012-12-02: 30 mg via INTRAMUSCULAR
  Filled 2012-12-02: qty 1

## 2012-12-02 MED ORDER — ONDANSETRON 4 MG PO TBDP
4.0000 mg | ORAL_TABLET | Freq: Once | ORAL | Status: AC
Start: 2012-12-02 — End: 2012-12-02
  Administered 2012-12-02: 4 mg via ORAL
  Filled 2012-12-02: qty 1

## 2012-12-02 NOTE — ED Provider Notes (Signed)
EMERGENCY DEPARTMENT HISTORY AND PHYSICAL EXAM     Physician/Midlevel provider first contact with patient: 12/02/12 1720         Date: 12/02/2012  Patient Name: Dorothy [name]  Attending Physician: Gweneth Dimitri DO  Diagnosis and Treatment Plan       Clinical Impression:   1. AP (abdominal pain)    2. Lightheadedness        Treatment Plan:   ED Disposition     Discharge Dorothy [name] discharge to home/self care.    Condition at discharge: Stable            History of Presenting Illness     Chief Complaint   Patient presents with   . Abdominal Pain       History Provided By: Pt  Chief Complaint: abd pain  Onset: 1 hr ago  Timing: intermittent  Location: LLQ  Quality: sharp  Severity: moderate  Modifying Factors: none  Associated sxs: blurred vision, lightheadedness, palpitations, N/V/D    Additional History: Dorothy [name] is a 37 y.o. female c/o LLQ abd pain that started 1 hrs ago.  Pt states that she was sitting in the shelter when she had a sudden onset of palpitations, "passing out sensation", and blurred vision. Pt states that some times she has sharp pain where her hernia is:LLQ.  Pt is feeling nauseous and last vomited 1 day ago.  Pt has also had diarrhea intermittently for 3 weeks. Pt denies syncope, hematuria, dysuria, CP, SOB, and dizziness.     PCP: Mariana Single, MD      Current facility-administered medications:[COMPLETED] ketorolac (TORADOL) injection 30 mg, 30 mg, Intramuscular, Once, Madaline Brilliant, DO, 30 mg at 12/02/12 1808;  [COMPLETED] ondansetron (ZOFRAN-ODT) disintegrating tablet 4 mg, 4 mg, Oral, Once, Madaline Brilliant, DO, 4 mg at 12/02/12 1948  Current outpatient prescriptions:HYDROcodone-acetaminophen (NORCO) 5-325 MG per tablet, Take 1-2 tablets by mouth every 6 (six) hours as needed for Pain., Disp: 15 tablet, Rfl: 0;  naproxen (NAPROSYN) 250 MG tablet, Take 2 tablets (500 mg total) by mouth 2 (two) times daily with meals., Disp: 30 tablet, Rfl:  0  [EXPIRED] nitrofurantoin, macrocrystal-monohydrate, (MACROBID) 100 MG capsule, Take 1 capsule (100 mg total) by mouth 2 (two) times daily., Disp: 14 capsule, Rfl: 0;  sitaGLIPtin-metFORMIN (JANUMET) 50-1000 MG per tablet, Take 1 tablet by mouth 2 (two) times daily with meals., Disp: , Rfl: ;  [EXPIRED] valACYclovir HCL (VALTREX) 500 MG tablet, Take 2 tablets (1,000 mg total) by mouth 2 (two) times daily., Disp: 28 tablet, Rfl: 0    Past Medical History     Past Medical History   Diagnosis Date   . Diabetes mellitus without complication    . Asthma without status asthmaticus    . Anemia    . Depression      Past Surgical History   Procedure Date   . Cesarean section    . Cholecystectomy    . Tonsillectomy    . Hernia repair        Family History     History reviewed. No pertinent family history.    Social History     History     Social History   . Marital Status: Significant Other     Spouse Name: N/A     Number of Children: N/A   . Years of Education: N/A     Social History Main Topics   . Smoking status: Never Smoker    . Smokeless  tobacco: Not on file   . Alcohol Use: No   . Drug Use: No   . Sexually Active: Not on file     Other Topics Concern   . Not on file     Social History Narrative   . No narrative on file        Allergies     Allergies   Allergen Reactions   . Percocet (Oxycodone-Acetaminophen)      Unknown reaction         Review of Systems     General:  No fever, sweats or chills. No LOC.   HENT: No headache, no nasal congestion, no sore throat  Respiratory: No cough, no shortness of breath.  Cardiovascular: + palpitations, No chest pain, no leg swelling.   Gastrointestinal: + abdominal pain, + nausea, + vomiting, + diarrhea. + hernia.   Genito-Urinary: No dysuria, no hematuria  Musculoskeletal: No myalgias, no joint pains.   Neurological: No new focal weakness, no sensory changes. +blurred vision (resolved), + lightheadedness (resolved)  Dermatological: No new rashes, no color changes.    Psychological: No acute mood changes, no confusion.     Physical Exam     BP 161/92  Pulse 86  Temp 97.7 F (36.5 C)  Resp 16  Ht 1.803 m  Wt 136.079 kg  BMI 41.86 kg/m2  SpO2 97%  LMP 10/06/2012    Constitutional: Vital signs reviewed. Well appearing, no apparent distress. Obese.   Head: Normocephalic, atraumatic. No external trauma noted.  Eyes: Conjunctiva and sclera are normal. Extraocular movements intact, pupils equal, round, reactive.  Ear, Nose, Throat:  Normal external examination of the nose and ears. Oropharynx clear, moist mucous membranes.  Neck: Supple. Trachea midline. No midline cervical spine tenderness.  Respiratory/Chest: Clear to auscultation. No respiratory distress. No wheezing, rhonchi or rales.  Cardiovascular: Regular rate. Regular rhythm. S1, S2. No murmurs.  Abdomen: Normoactive Bowel sounds. Soft. Non-tender to palpation.  No guarding or rebound.  Back: No midline tenderness, no CVA tenderness.   Extremities: Upper and lower extremities with no cyanosis. No edema. No calf tenderness.   Skin: Warm and dry. No rash.  Neuro: alert and appropriate,  upper and lower extremities normal 5/5 strength, sensation intact. Cranial nerves 2-12 grossly intact, normal finger to nose, normal speech. Normal gait.     Diagnostic Study Results     Labs -     Results     Procedure Component Value Units Date/Time    POCT UA Clinitek AX (urine dipstick) [[id number]]  (Abnormal) Collected:12/02/12 1804    Specimen Information:Urine Updated:12/02/12 1806     Color UA POCT Light Yellow      Clarity UA POCT Cloudy      Glucose, UA POCT Negative      Bilirubin, UA POCT Negative      Ketones, UA POCT Negative mg/dL      Specific Gravity, UA POCT 1.020 mg/dL      Blood, UA POCT  Negative      PH, UA POCT 7.0      Protein, UA POCT Negative mg/dL      Urobilinogen, UA POCT 0.2 mg/dL      Nitrite, UA POCT Negative      Leukocytes, UA POCT Trace  (A)     Urine HCG POC [[id number]] Collected:12/02/12 1803     Specimen Information:Urine Updated:12/02/12 1804     POCT QC Pass      POCT Pregnancy HCG Test, UR Negative  Comment:        Result:     Negative Value is Normal in Healthy Males or Healthy non-pregnant Females    Accucheck [[id number]]  (Abnormal) Collected:12/02/12 1749    Specimen Information:Blood Updated:12/02/12 1750     POCT Glucose WB 149 (A) mg/dL           Radiologic Studies -   Radiology Results (24 Hour)     ** No Results found for the last 24 hours. **      .    Medical Decision Making   I am the first provider for this patient.    I reviewed the vital signs, available nursing notes, past medical history, past surgical history, family history and social history.    Vital Signs-Reviewed the patient's vital signs.     Patient Vitals for the past 12 hrs:   BP Temp Pulse Resp   12/02/12 1951 161/92 mmHg - 86  16    12/02/12 1702 142/79 mmHg 97.7 F (36.5 C) 80  18        EKG:  Interpreted by the Emergency Physician.   Time Interpreted: 5:55   Rate: 74   Rhythm: Normal Sinus Rhythm    Interpretation:Normal intervals, no st elevation or depression   Comparison: no acute change compared to November 03, 2012.      Pulse ox: 99% on RA-normal.     Old records reviewed: Pt was last seen on 8/2 with vaginal pain, requesting admission for pain control but refused to have pelvic exam, has been seen here for pain in ER a number of times.     ED Course: 6:22 PM- Pt states that after being given pain medication she does feel nauseous but her pain was relieved a little.  Will order nausea medication.     7:50 PM-  Pt tolerated PO and is feeling better and would like to go home.  Discussed test results with pt and counseled on diagnosis, f/u plans, and signs and symptoms when to return to ED.  Pt is stable and ready for discharge.      8 PM- nurse came up to me and patient requesting pain medicine for home and explained that will not give and needs to follow up with PCP for pain medicine.       Provider Notes: MDM: Pt  with abdominal pain chronic secondary to hernia " that no one will fix" - no significant tenderness here, also reported some lightheadedness when had the pain that is better, ok accucheck and normal ekg and do not think acute cardiac issue, reviewed labs from 7/28 which looked ok and do not think need to repeat at this time.       Doctor's Notes     Throughout the stay in the Emergency Department, questions and concerns surrounding pain control, care plans, diagnostic studies, effects of medications administered or prescribed, and future prognostic dilemmas were assessed and addressed.    ROS addendum: The patient and/or family was asked if they had any other complaints or concerns that we could address today and nothing of significance was noted.     _______________________________  Medical DeMedical Decision Makingcision Stacy Gardner  Attestations:    This note is prepared by Gretta Cool, acting as Scribe for Gweneth Dimitri, DO.     Gweneth Dimitri, DO: The scribe's documentation has been prepared under my direction and personally reviewed by me in its entirety.  I confirm that the note above accurately reflects all work, treatment,  procedures, and medical decision making performed by me.      Madaline Brilliant, DO  12/02/12 2135

## 2012-12-02 NOTE — ED Notes (Signed)
Pt c/o left side abd pain. Pt states she has a hernia in the area where it hurts.

## 2012-12-02 NOTE — Discharge Instructions (Signed)
Abdominal Pain    You have been diagnosed with abdominal (belly) pain. The cause of your pain is not yet known.    Many things can cause abdominal pain. Examples include viral infections and bowel (intestine) spasms. You might need another examination or more tests to find out why you have pain.    At this time, your pain does not seem to be caused by anything dangerous. You do not need surgery. You do not need to stay in the hospital.     Though we don't believe your condition is dangerous right now, it is important to be careful. Sometimes a problem that seems mild can become serious later. This is why it is very important that you return here or go to the nearest Emergency Department unless you are 100% improved.    Return here or go to the nearest Emergency Department, or follow up with your physician in:   24 hours.    Drink only clear liquids such as water, clear broth, sports drinks, or clear caffeine-free soft drinks, like 7-Up or Sprite, for the next:   24 hours.    YOU SHOULD SEEK MEDICAL ATTENTION IMMEDIATELY, EITHER HERE OR AT THE NEAREST EMERGENCY DEPARTMENT, IF ANY OF THE FOLLOWING OCCURS:   Your pain does not go away or gets worse.   You cannot keep fluids down or your vomit is dark green.    You vomit blood or see blood in your stool. Blood might be bright red or dark red. It can also be black and look like tar.   You have a fever or shaking chills.   Your skin or eyes look yellow or your urine looks brown.   You have severe diarrhea.      Lightheadedness    You have been diagnosed with lightheadedness.    Lightheadedness is a feeling that you are about to faint or "pass out." You may feel dizzy,but you should not feel as though the room is spinning. If you experience spinning, this may be a sign of another condition.    Lightheadedness can lead to a feeling of almost fainting or an actual fainting spell. You may sometimes feel nauseated or vomit when you are lightheaded. Lying down  usually makes lightheadness go away.    Lightheadedness is often caused by a slight drop in blood pressure. This causes a decrease in the amount of blood that gets to the head. This can happens when you get up too quickly from sitting or lying down.    Some commons reasons to feel lightheaded include:   Dehydration (not getting enough fluid). This can happen if you are not drinking enough fluid, especially in hot weather when you are sweating a lot.   Missing a meal. Some people become lightheaded when they skip a meal or two during the day.   Illness. Some people feel lightheaded when they are sick, especially when they have a fever.   Blood loss. If you are losing blood you can become lightheaded. This can happen with heavy menstrual bleeding, bleeding from your stomach or intestines (sometimes this causes dark or black stools). If you lose blood slowly over time, you may not notice until you get symptoms like feeling lightheaded.   Sometimes we just can't figure out why someone feels lightheaded.    When you feel lightheaded, lie down for 1 or 2 minutes. This will allow more blood to flow to your brain. After lying down, sit up slowly and remain sitting for  1 to 2 minutes before slowly standing up.     The doctor caring for you today does not think the lightheadedness is caused by anything dangerous. It is safe for you to go home.    YOU SHOULD SEEK MEDICAL ATTENTION IMMEDIATELY, EITHER HERE OR AT THE NEAREST EMERGENCY DEPARTMENT, IF ANY OF THE FOLLOWING OCCUR:   You pass out.   You feel confused.   You feel short of breath.   You have chest pain.

## 2012-12-02 NOTE — ED Notes (Addendum)
Bed:GR9<BR>Expected date:12/02/12<BR>Expected time: 4:39 PM<BR>Means of arrival:[id number] [place]<BR>Comments:<BR>

## 2012-12-03 LAB — ECG 12-LEAD
Atrial Rate: 74 {beats}/min
P Axis: 68 degrees
P-R Interval: 158 ms
Q-T Interval: 394 ms
QRS Duration: 96 ms
QTC Calculation (Bezet): 437 ms
R Axis: 6 degrees
T Axis: 23 degrees
Ventricular Rate: 74 {beats}/min

## 2012-12-05 ENCOUNTER — Emergency Department
Admission: EM | Admit: 2012-12-05 | Discharge: 2012-12-05 | Disposition: A | Discharge status: Home / Self Care | Payer: Medicare Other | Attending: Emergency Medicine | Admitting: Emergency Medicine

## 2012-12-05 ENCOUNTER — Emergency Department: Payer: Medicare Other

## 2012-12-05 DIAGNOSIS — L03011 Cellulitis of right finger: Secondary | ICD-10-CM

## 2012-12-05 DIAGNOSIS — L03019 Cellulitis of unspecified finger: Secondary | ICD-10-CM | POA: Insufficient documentation

## 2012-12-05 DIAGNOSIS — E119 Type 2 diabetes mellitus without complications: Secondary | ICD-10-CM | POA: Insufficient documentation

## 2012-12-05 MED ORDER — IBUPROFEN 600 MG PO TABS
600.0000 mg | ORAL_TABLET | Freq: Four times a day (QID) | ORAL | Status: DC | PRN
Start: 2012-12-05 — End: 2013-07-16

## 2012-12-05 MED ORDER — HYDROCODONE-ACETAMINOPHEN 5-325 MG PO TABS
2.0000 | ORAL_TABLET | Freq: Once | ORAL | Status: AC
Start: 2012-12-05 — End: 2012-12-05
  Administered 2012-12-05: 2 via ORAL
  Filled 2012-12-05: qty 2

## 2012-12-05 MED ORDER — CEPHALEXIN 500 MG PO CAPS
500.0000 mg | ORAL_CAPSULE | Freq: Four times a day (QID) | ORAL | Status: AC
Start: 2012-12-05 — End: 2012-12-12

## 2012-12-05 NOTE — ED Provider Notes (Signed)
Attending Note (MLP Attestation)      The patient was seen and examined by the mid-level (physician assistant or nurse practitioner), and the plan of care was discussed with me. I agree with the clinical impression and plan as it was presented to me.     Sharyne Richters, MD  12/05/12 [phone]

## 2012-12-05 NOTE — Discharge Instructions (Signed)
Thank you for your patience in the emergency department today.    Return to the ER if worse    Tylenol or Motrin as needed for pain/fever    Please take antibiotics as prescribed.    Please apply warm compresses as often as possible throughout the day.    Please follow up with your primary care provider, see a hand specialist (referral provided), or return here in 2 days for wound check.    Paronychia    You have been diagnosed with a paronychia.    A paronychia is an infection in the skin on the side of a fingernail or toenail.    The tissue next to the nail is usually warm, red, swollen and painful. It may contain pus.    It is usually treated by making a small cut (incision) in the skin next to the nail. This is made to drain the pus. Sometimes, a small part of the nail must also be taken out.    Normally, treatment only involves cutting into the skin and draining the pus, but antibiotics are sometimes prescribed.    Sometimes a paronychia may not have developed a pus pocket. When this happens, treatment is with antibiotics and moist soaks.    Patients often ask for antibiotics. If an incision (cut in the skin) has been made and the pus drained, antibiotics are often not needed. Your doctor will decide.    Take off old dressings every day. Put on a clean, dry dressing. If the dressing sticks to the wound, slightly moisten it with water. This way, it can come off more easily.    YOU SHOULD SEEK MEDICAL ATTENTION IMMEDIATELY, EITHER HERE OR AT THE NEAREST EMERGENCY DEPARTMENT, IF ANY OF THE FOLLOWING OCCURS:   You see redness or swelling.   There are red streaks going up the arm or leg.   The wound smells bad or has a lot of drainage.   It hurts when you move the extremity and/or you have swollen lymph nodes.   You have fever, chills, worse pain and / or swelling.

## 2012-12-05 NOTE — ED Notes (Signed)
Pt sts she had pain in the right ring finger yesterday, when she woke up this morning she noticed the swelling this morning. Pt denies any fever.

## 2012-12-05 NOTE — ED Provider Notes (Signed)
EMERGENCY DEPARTMENT HISTORY AND PHYSICAL EXAM    Date: 12/05/2012  Patient Name: Dorothy [name]  Attending Physician: Sharyne Richters, *      History of Presenting Illness     Chief Complaint   Patient presents with   . Hand Pain       History Provided By: patient    Chief Complaint: right ring finger pain  Onset: yesterday  Timing: worse this morning upon waking  Location: tip of R ring finger  Quality: aching  Severity: moderate  Modifying Factors: hx of paroynchia    Additional History: Dorothy [name] is a 37 y.o. female here today complaining of R ring finger pain that she noticed yesterday but progressively worsened this morning with swelling noted. Pt states she has a history of paronychia. Denies numbness or tingling. Denies fever/chills. She is right hand dominant.     PCP: Dorothy Single, MD    No current facility-administered medications for this encounter.     Current Outpatient Prescriptions   Medication Sig Dispense Refill   . GLIPIZIDE PO Take by mouth.       Marland Kitchen lisinopril (PRINIVIL,ZESTRIL) 20 MG tablet Take 20 mg by mouth daily.       Marland Kitchen HYDROcodone-acetaminophen (NORCO) 5-325 MG per tablet Take 1-2 tablets by mouth every 6 (six) hours as needed for Pain.  15 tablet  0   . naproxen (NAPROSYN) 250 MG tablet Take 2 tablets (500 mg total) by mouth 2 (two) times daily with meals.  30 tablet  0   . sitaGLIPtin-metFORMIN (JANUMET) 50-1000 MG per tablet Take 1 tablet by mouth 2 (two) times daily with meals.             Past Medical History     Past Medical History   Diagnosis Date   . Diabetes mellitus without complication    . Asthma without status asthmaticus    . Anemia    . Depression      Past Surgical History   Procedure Date   . Cesarean section    . Cholecystectomy    . Tonsillectomy    . Hernia repair        Family History     Family History   Problem Relation Age of Onset   . Diabetes Mother    . Diabetes Sister    . Diabetes Maternal Aunt    . Hypertension Paternal Grandmother           Social History     History   Substance Use Topics   . Smoking status: Never Smoker    . Smokeless tobacco: Not on file   . Alcohol Use: No         Allergies     Allergies   Allergen Reactions   . Percocet (Oxycodone-Acetaminophen)      Unknown reaction     . Tape        Review of Systems     Review of Systems   Constitutional: Negative for fever and chills.   Musculoskeletal:        + R ring finger pain   Skin:        + R ring finger swelling and redness   Neurological: Negative for tingling and sensory change.         Physical Exam   BP 166/87  Pulse 80  Temp 97.5 F (36.4 C) (Oral)  Resp 16  SpO2 96%  LMP 10/06/2012    Physical Exam  Nursing note and vitals reviewed.  Constitutional: She is oriented to person, place, and time.        Pt appears well developed and well nourished. She does not appear to be in acute distress.    HENT:   Head: Normocephalic and atraumatic.   Right Ear: External ear normal.   Left Ear: External ear normal.   Eyes: Conjunctivae normal and EOM are normal. Right eye exhibits no discharge. Left eye exhibits no discharge. No scleral icterus.   Neck: Normal range of motion.   Cardiovascular: Normal rate, regular rhythm and normal heart sounds.    Pulmonary/Chest: Effort normal and breath sounds normal. No respiratory distress. She has no wheezes. She has no rales. She exhibits no tenderness.   Musculoskeletal: Normal range of motion. She exhibits no edema and no tenderness.   Neurological: She is alert and oriented to person, place, and time. GCS score is 15.   Skin: Skin is warm and dry. No rash noted. She is not diaphoretic. No erythema. No pallor.        R ring finger distal tip surrounding nailbed - edematous and tender with yellow and green pus visualized. Surrounding erythema.    Psychiatric: Mood, memory, affect and judgment normal.         Procedures   Incision/Drainage  Date/Time: 12/05/2012 10:59 AM  Performed by: Effie Berkshire  Authorized by: Ulice Bold  MATTHEW  Consent: Verbal consent obtained.  Consent given by: patient  Patient identity confirmed: verbally with patient  Time out: Immediately prior to procedure a "time out" was called to verify the correct patient, procedure, equipment, support staff and site/side marked as required.  Type: abscess (paronychia)  Body area: upper extremity  Location details: right ring finger  Anesthesia: digital block  Local anesthetic: lidocaine 1% without epinephrine  Anesthetic total: 3 ml  Patient sedated: no  Scalpel size: 11  Incision type: [name] straight  Complexity: simple  Drainage: purulent  Drainage amount: moderate  Wound treatment: wound left open  Patient tolerance: Patient tolerated the procedure well with no immediate complications.          Diagnostic Study Results     Labs -     Results     ** No Results found for the last 24 hours. **          Radiologic Studies -   Radiology Results (24 Hour)     ** No Results found for the last 24 hours. **      .    Clinical Course in the Emergency Department     ED Course:      Medical Decision Making   I am the first provider for this patient.    I reviewed the vital signs, available nursing notes, past medical history, past surgical history, family history and social history.    Vital Signs-Reviewed the patient's vital signs.     Patient Vitals for the past 12 hrs:   BP Temp Pulse Resp   12/05/12 1003 166/87 mmHg 97.5 F (36.4 C) 80  16        Pulse Oximetry Analysis - Normal 96% on RA    Provider Notes:   Pt with obvious paronychia to R ring finger, incised and drained by me. Purulent yellow pus drained. Wound culture sent. Will start on antibiotics. Advise warm compresses and follow up with hand specialist or PCP or return here for wound check. Pt advised to return sooner if worse. Pt understands and  agrees with the plan. Case discussed with Dr.Krieger.    Diagnosis and Treatment Plan       Clinical Impression:   1. Paronychia of finger, right         _______________________________          Effie Berkshire, PA  12/05/12 1124

## 2012-12-21 DIAGNOSIS — Z5320 Procedure and treatment not carried out because of patient's decision for unspecified reasons: Secondary | ICD-10-CM | POA: Insufficient documentation

## 2012-12-27 ENCOUNTER — Emergency Department
Admission: EM | Admit: 2012-12-27 | Discharge: 2012-12-27 | Disposition: A | Discharge status: Home / Self Care | Payer: Medicare Other | Attending: Emergency Medicine | Admitting: Emergency Medicine

## 2012-12-27 ENCOUNTER — Emergency Department: Payer: Medicare Other

## 2012-12-27 DIAGNOSIS — M79609 Pain in unspecified limb: Secondary | ICD-10-CM | POA: Insufficient documentation

## 2012-12-27 DIAGNOSIS — W010XXA Fall on same level from slipping, tripping and stumbling without subsequent striking against object, initial encounter: Secondary | ICD-10-CM | POA: Insufficient documentation

## 2012-12-27 DIAGNOSIS — M79672 Pain in left foot: Secondary | ICD-10-CM

## 2012-12-27 DIAGNOSIS — S93409A Sprain of unspecified ligament of unspecified ankle, initial encounter: Secondary | ICD-10-CM | POA: Insufficient documentation

## 2012-12-27 DIAGNOSIS — E119 Type 2 diabetes mellitus without complications: Secondary | ICD-10-CM | POA: Insufficient documentation

## 2012-12-27 DIAGNOSIS — J45909 Unspecified asthma, uncomplicated: Secondary | ICD-10-CM | POA: Insufficient documentation

## 2012-12-27 DIAGNOSIS — S93402A Sprain of unspecified ligament of left ankle, initial encounter: Secondary | ICD-10-CM

## 2012-12-27 DIAGNOSIS — Z9089 Acquired absence of other organs: Secondary | ICD-10-CM | POA: Insufficient documentation

## 2012-12-27 MED ORDER — IBUPROFEN 400 MG PO TABS
800.0000 mg | ORAL_TABLET | Freq: Once | ORAL | Status: AC
Start: 2012-12-27 — End: 2012-12-27
  Administered 2012-12-27: 800 mg via ORAL
  Filled 2012-12-27: qty 2

## 2012-12-27 MED ORDER — HYDROCODONE-ACETAMINOPHEN 5-325 MG PO TABS
1.0000 | ORAL_TABLET | Freq: Four times a day (QID) | ORAL | Status: DC | PRN
Start: 2012-12-27 — End: 2013-07-16

## 2012-12-27 MED ORDER — IBUPROFEN 600 MG PO TABS
600.0000 mg | ORAL_TABLET | Freq: Four times a day (QID) | ORAL | Status: DC | PRN
Start: 2012-12-27 — End: 2013-07-16

## 2012-12-27 MED ORDER — HYDROCODONE-ACETAMINOPHEN 5-325 MG PO TABS
1.00 | ORAL_TABLET | Freq: Once | ORAL | Status: AC
Start: 2012-12-27 — End: 2012-12-27
  Administered 2012-12-27: 1 via ORAL
  Filled 2012-12-27: qty 1

## 2012-12-27 MED ORDER — HYDROCODONE-ACETAMINOPHEN 5-325 MG PO TABS
1.0000 | ORAL_TABLET | Freq: Once | ORAL | Status: AC
Start: 2012-12-27 — End: 2012-12-27
  Administered 2012-12-27: 1 via ORAL
  Filled 2012-12-27: qty 1

## 2012-12-27 NOTE — ED Provider Notes (Signed)
Physician/Midlevel provider first contact with patient: 12/27/12 1335       EMERGENCY DEPARTMENT HISTORY AND PHYSICAL EXAM    Date: 12/27/2012  Patient Name: Dorothy [name]    History of Presenting Illness     Chief Complaint   Patient presents with   . Ankle Injury       History Provided By: Patient    Chief Complaint: Left ankle pain  Onset: sudden with injury  Timing: immediately PTA  Location: left ankle  Quality: throbbing  Severity: 10/10  Modifying Factors: Increases with movement, palpation  Associated Symptoms: + intermittent paresthesia left toes    Additional History: Dorothy [name] is a 37 y.o. female who was BIBA with c/o left ankle pain. The injury occurred when patient's left foot was caught in a crack in the cement and she fell to her left side. Treatments PTA include ice placed by EMS. Denies paresthesias and was not able to bear weight immediately following injury. No LOC, head trauma, or other injuries.  Tetanus shot is NA- no breaks in skin.  No h/o injury or surgery to affected ankle.    PCP: Mariana Single, MD    Current Facility-Administered Medications   Medication Dose Route Frequency Provider Last Rate Last Dose   . [COMPLETED] HYDROcodone-acetaminophen (NORCO) 5-325 MG per tablet 1 tablet  1 tablet Oral Once Charlton Haws, NP   1 tablet at 12/27/12 1346   . [COMPLETED] HYDROcodone-acetaminophen (NORCO) 5-325 MG per tablet 1 tablet  1 tablet Oral Once Charlton Haws, NP   1 tablet at 12/27/12 1533   . [COMPLETED] ibuprofen (ADVIL,MOTRIN) tablet 800 mg  800 mg Oral Once Charlton Haws, NP   800 mg at 12/27/12 1346     Current Outpatient Prescriptions   Medication Sig Dispense Refill   . UNKNOWN TO PATIENT        . GLIPIZIDE PO Take by mouth.       Marland Kitchen HYDROcodone-acetaminophen (NORCO) 5-325 MG per tablet Take 1-2 tablets by mouth every 6 (six) hours as needed for Pain.  15 tablet  0   . HYDROcodone-acetaminophen (NORCO) 5-325 MG per tablet Take 1 tablet by  mouth every 6 (six) hours as needed for Pain.  15 tablet  0   . ibuprofen (ADVIL,MOTRIN) 600 MG tablet Take 1 tablet (600 mg total) by mouth every 6 (six) hours as needed for Pain or Fever.  30 tablet  0   . ibuprofen (ADVIL,MOTRIN) 600 MG tablet Take 1 tablet (600 mg total) by mouth every 6 (six) hours as needed for Pain.  30 tablet  0   . lisinopril (PRINIVIL,ZESTRIL) 20 MG tablet Take 20 mg by mouth daily.       . naproxen (NAPROSYN) 250 MG tablet Take 2 tablets (500 mg total) by mouth 2 (two) times daily with meals.  30 tablet  0   . sitaGLIPtin-metFORMIN (JANUMET) 50-1000 MG per tablet Take 1 tablet by mouth 2 (two) times daily with meals.           Past History     Past Medical History:  Past Medical History   Diagnosis Date   . Diabetes mellitus without complication    . Asthma without status asthmaticus    . Anemia    . Depression        Past Surgical History:  Past Surgical History   Procedure Date   . Cesarean section    . Cholecystectomy    . Tonsillectomy    .  Hernia repair        Family History:  Family History   Problem Relation Age of Onset   . Diabetes Mother    . Diabetes Sister    . Diabetes Maternal Aunt    . Hypertension Paternal Grandmother        Social History:  History   Substance Use Topics   . Smoking status: Never Smoker    . Smokeless tobacco: Not on file   . Alcohol Use: No       Allergies:  Allergies   Allergen Reactions   . Percocet (Oxycodone-Acetaminophen)      Unknown reaction     . Tape        Review of Systems     Review of Systems   Constitutional: Negative for fever and chills.   HENT: Negative for neck pain.   Musculoskeletal: Positive for joint pain-left ankle, decreased ROM, swelling. Negative for back pain. No left knee pain.  Skin: Negative for rash. No erythema or ecchymosis.  Neurological: Positive for focal weakness- left ankle. Negative for LOC, dizziness, tingling, sensory change, weakness and headaches.  All other systems reviewed and are negative.    Physical Exam    BP 116/57  Pulse 78  Temp 96.9 F (36.1 C) (Oral)  Resp 20  SpO2 100%  LMP 12/02/2012    CONSTITUTIONAL: Well-developed, well-nourished. Alert, oriented and in no acute distress. Vital signs reviewed. Vital signs normal.  HEAD: Normocephalic, atraumatic.   EYES: Sclera not injected and nonicteric. Conjunctiva clear. No discharge.   NECK: Supple, full ROM without pain.  RESPIRATORY: No respiratory distress.  BACK: No pain on movement, full ROM.  MS:  + Bony TTP left lateral malleolus with associated edema.  No erythema or ecchymosis.  Severely limited ROM with dorsi/plantar flexion and inversion/eversion movements.  Left ankle without bony TTP at medial malleolus, base of the fifth metatarsal, midfoot bones, and heel.  Tib/fib NTTP and left knee is without bony tenderness and with full ROM without pain.  DP and PT strong and equal bilaterally.  Cap refill <3 seconds.  Good distal blood flow.   Achilles intact.  SKIN: Warm, dry and intact. No rash or lesions.  No ecchymosis or erythema.  NEURO: A&O x 3. Equal strength in bilateral upper and lower extremities. Sensation intact throughout.  PSYCH: Normal affect, insight and concentration for age.       Diagnostic Study Results     Labs -     Results     ** No Results found for the last 24 hours. **          Radiologic Studies -   Radiology Results (24 Hour)     Procedure Component Value Units Date/Time    Foot Left AP Lateral and Oblique [[id number]] Collected:12/27/12 1519    Order Status:Completed  Updated:12/27/12 1525    Narrative:    XR FOOT LEFT AP LATERAL AND OBLIQUE  CLINICAL INDICATION: R/O 5 MT fx- lateral foot pain    COMPARISON: None available.    FINDINGS:  3 views  were obtained. There is no fracture. The joint  alignment is well maintained. A plantar calcaneal spur noted.          Impression:     No fracture or acute process        Heron Nay, MD   12/27/2012 3:20 PM    Ankle Left 3+ Views [[id number]] Collected:12/27/12 1412    Order  Status:Completed  Updated:12/27/12 1417  Narrative:    INDICATION: Pain following injury.    TECHNIQUE:  4 views of the left ankle were obtained.    FINDINGS:  There is no evidence of fracture or dislocation. There is an  ununited ossicle adjacent to the distal medial malleolus. A plantar  calcaneal spur is noted. No significant soft tissue abnormality is  identified.      Impression:      No evidence of fracture.    Aquilla Hacker, MD   12/27/2012 2:13 PM      .      Medical Decision Making   I am the first provider for this patient.    I reviewed the available nursing notes, past medical history, past surgical history, family history and social history.       Patient Vitals for the past 12 hrs:   BP Temp Pulse Resp   12/27/12 1530 116/57 mmHg 96.9 F (36.1 C) 78  20        Pulse Oximetry Analysis - Normal 100% on RA    ED Course: 1445- Xrays resulted, pt informed of their results.  She is now having new c/o left lateral foot pain.  Will xray left foot.  States pain decreased.  1525- Pt returned from xray, requesting additional pain medications- denies other needs  1535- Xrays resulted pt made aware.  Will treat with splint and NWB, ortho f/u d/t h/o prior right ankle fracture that was initially misdiagnosed as sprain.    Provider Notes: This is a 37 y.o. patient without pMHX who presents with c/o left ankle pain.  Due to presence of bony tenderness at lateral maleolus, will obtain imaging to r/o fracture.  xrays reviewed by this NP and radiology and are neg for acute process.  Stable to d/c home at this time.  Posterior short splint applied- neurovascularly intact following splint placement.  Pain Rx, rest (pt educated on crutches and provided with these, but pt states she will prefer to seek out w/c from DME company), ice, elevation, and ortho f/u.  Dr. Joycelyn Das in agreement with POC.  Pt educated on return precautions.  D/C instructions provided to pt - verbalized understanding.  Business card provided to call  with any questions or concerns.      Diagnosis     Clinical Impression:   1. Ankle sprain, left, initial encounter    2. Foot pain, left        ______________________________          Charlton Haws, NP  12/27/12 1544

## 2012-12-27 NOTE — ED Notes (Signed)
Bed:EX25<BR> Expected date:<BR> Expected time:<BR> Means of arrival:[id number] [place]<BR> Comments:<BR>

## 2012-12-27 NOTE — ED Notes (Signed)
Pt BIBA for left ankle injury - pt states she was leaving a store when her foot got caught "in a crack in the cement and I fell."  Pain to ankle described as "throbbing" and 10/10.  Pt denies head injury, but hit right arm.  Ice currently on left ankle.

## 2012-12-29 ENCOUNTER — Emergency Department
Admission: EM | Admit: 2012-12-29 | Discharge: 2012-12-29 | Disposition: A | Discharge status: Home / Self Care | Payer: Medicare Other | Attending: Emergency Medicine | Admitting: Emergency Medicine

## 2012-12-29 ENCOUNTER — Emergency Department: Payer: Medicare Other

## 2012-12-29 DIAGNOSIS — F3289 Other specified depressive episodes: Secondary | ICD-10-CM | POA: Insufficient documentation

## 2012-12-29 DIAGNOSIS — E119 Type 2 diabetes mellitus without complications: Secondary | ICD-10-CM | POA: Insufficient documentation

## 2012-12-29 DIAGNOSIS — M79609 Pain in unspecified limb: Secondary | ICD-10-CM | POA: Insufficient documentation

## 2012-12-29 DIAGNOSIS — J45909 Unspecified asthma, uncomplicated: Secondary | ICD-10-CM | POA: Insufficient documentation

## 2012-12-29 NOTE — ED Notes (Signed)
BROUGHT BY EMS, SEEN AT [facility] 2 DAYS AGO FOR LEFT ANKLE INJURY, ROLLED ANKLE, DX WITH SPRAIN, PT C/O INCREASED PAIN AND SWELLING TO LEFT ANKLE. PT STATES 'I'VE BEEN CRAWLING AROUND' PT STATES DIFFICULTY USING CRUTCHES BECAUSE OF PREVIOUS RIGHT ANKLE FX. A&OX3. ACE WRAP TO LEFT ANKLE.

## 2012-12-29 NOTE — Discharge Instructions (Signed)
It was a pleasure taking care of you in the Emergency Department today.  You were seen by Dr. Marina Goodell.    Take ibuprofen (motrin/advil) 600mg  every 6 hours with food for pain or fever.    You can also use acetaminophen (tylenol) up to 3 grams a day for pain or fever.    Please follow up with your primary care doctor in 2-3 days.  Return immediately to the ER for worsening symptoms, or new concerning symptoms.     Follow up with the orthopedist as previously discussed.

## 2012-12-29 NOTE — ED Provider Notes (Signed)
Physician/Midlevel provider first contact with patient: 12/29/12 1541       EMERGENCY DEPARTMENT HISTORY AND PHYSICAL EXAM    Date: 12/29/2012  Patient Name: Dorothy [name]  Attending Physician: Oneal Deputy, MD    Diagnosis and Treatment Plan       Presumptive Diagnosis:   1. 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 Foot pain, left         Treatment Plan:   ED Disposition     Discharge Allegra Grana discharge to home/self care.    Condition at disposition: Stable            New Prescriptions    No medications on file       History of Presenting Illness     Chief Complaint:   Chief Complaint   Patient presents with   . Ankle Pain     Dorothy [name] is a 37 [name] female with history of  has a past medical history of Diabetes mellitus without complication; Asthma without status asthmaticus; Anemia; and Depression. presents to ED for severe L ankle pain and inability to ambulate with crutches. Reports that when she uses the crutches she still can't put her weight on her foot because of the pain. Was seen 2 days ago at [facility], had XR of ankle and foot which were negative - given ace wrap, crutches. Denies any re-injury. Tried to see orthopedics referral but due to insurance, needs PMD authorization - has appt Monday.     This history was obtained from pt.     PCP: Mariana Single, MD      Past Medical History     Past Medical History   Diagnosis Date   . Diabetes mellitus without complication    . Asthma without status asthmaticus    . Anemia    . Depression        Past Surgical History   Procedure Date   . Cesarean section    . Cholecystectomy    . Tonsillectomy    . Hernia repair        Family History     Family History   Problem Relation Age of Onset   . Diabetes Mother    . Diabetes Sister    . Diabetes Maternal Aunt    . Hypertension Paternal Grandmother        Social History     History     Social History   . Marital Status: Significant Other     Spouse Name: N/A     Number of Children: N/A    . Years of Education: N/A     Occupational History   . Not on file.     Social History Main Topics   . Smoking status: Never Smoker    . Smokeless tobacco: Not on file   . Alcohol Use: No   . Drug Use: No   . Sexually Active: Not on file     Other Topics Concern   . Not on file     Social History Narrative   . No narrative on file        Allergies     Allergies   Allergen Reactions   . Percocet (Oxycodone-Acetaminophen)      Unknown reaction     . Tape        Medications     No current facility-administered medications for this encounter.  Current outpatient prescriptions:GLIPIZIDE PO, Take by mouth., Disp: , Rfl: ;  HYDROcodone-acetaminophen (NORCO) 5-325 MG per tablet, Take 1-2 tablets by mouth every 6 (six) hours as needed for Pain., Disp: 15 tablet, Rfl: 0;  HYDROcodone-acetaminophen (NORCO) 5-325 MG per tablet, Take 1 tablet by mouth every 6 (six) hours as needed for Pain., Disp: 15 tablet, Rfl: 0  ibuprofen (ADVIL,MOTRIN) 600 MG tablet, Take 1 tablet (600 mg total) by mouth every 6 (six) hours as needed for Pain or Fever., Disp: 30 tablet, Rfl: 0;  ibuprofen (ADVIL,MOTRIN) 600 MG tablet, Take 1 tablet (600 mg total) by mouth every 6 (six) hours as needed for Pain., Disp: 30 tablet, Rfl: 0;  lisinopril (PRINIVIL,ZESTRIL) 20 MG tablet, Take 20 mg by mouth daily., Disp: , Rfl:   naproxen (NAPROSYN) 250 MG tablet, Take 2 tablets (500 mg total) by mouth 2 (two) times daily with meals., Disp: 30 tablet, Rfl: 0;  sitaGLIPtin-metFORMIN (JANUMET) 50-1000 MG per tablet, Take 1 tablet by mouth 2 (two) times daily with meals., Disp: , Rfl: ;  UNKNOWN TO PATIENT, , Disp: , Rfl:     Review of Systems     Pertinent Positives and Negatives noted in the HPI.  All Other Systems Reviewed and Negative: Yes    Physical Exam   Initial ED Triage Vitals   Enc Vitals Group      BP 12/29/12 1515 136/78 mmHg      Heart Rate 12/29/12 1515 93       Resp Rate 12/29/12 1515 18       Temp 12/29/12 1515 97.5 F (36.4 C)      Temp src --        SpO2 12/29/12 1515 99 %      Weight 12/29/12 1515 99.791 kg      Height 12/29/12 1515 1.803 m      Head Cir --       Peak Flow --       Pain Score --       Pain Loc --       Pain Edu? --       Excl. in GC? --      Vital signs reviewed.  General: Well appearing, A&Ox3. Comfortable, NAD.  Head: Atraumatic, normocephalic.  Eyes: No conjunctival injection. No discharge. PERRL.   Respiratory/Chest: No respiratory distress.   Upper Extremity: No edema. Full ROM.     Back:  No CVA tenderness.  No midline spine tenderness.    Lower Extremity: Minimal edema to left ankle, ttp over dorsum of left foot and b/l malleoli.  No calf tenderness.  Full ROM.  Skin: Warm and dry.   Psychiatric: Normal affect.  Normal insight.       Diagnostic Study Results     Labs -     Results     ** No Results found for the last 24 hours. **          Radiologic Studies -   Radiology Results (24 Hour)     ** No Results found for the last 24 hours. **      .       Clinical Course and Medical Decision Making   Amount and/or Complexity of Data Reviewed  First MD: Steve Rattler, MD  I have reviewed the vital signs, past medical hx, past surgical hx, social hx, family hx.  All pertinent lab results reviewed and interpreted by myself: N\A  All radiology ordered, reviewed, and interpreted by myself: N\A  Discuss the patient with other providers:  no  Reviewed patient's old  records if available: yes  Nursing notes reviewed: yes  Patient Vitals for the past 12 hrs:   BP Temp Pulse Resp   12/29/12 1515 136/78 mmHg 97.5 F (36.4 C) 93  18      Impression/Plan: 49F p/w continued left foot/ankle pain s/p injury 2 days ago, unable to walk on crutches, no new injuries, minimal swelling noted, xrays of left foot and ankle reviewed by me - will upgrade to walker, RICE, f/u with PMD tomorrow. Pt ambulating with walker on discharge.     Patient discharged by me at 3:55 PM.   Departure Condition: Good, stable  Mobility at Discharge: Ambulatory with assistance  Walker  Patient Teaching: Discharge instructions reviewed and patient verbalizes understanding  Departure Mode: By self With friend      Counseling: I have spoken with the patient and discussed today's findings, in addition to providing specific details for the plan of care. Questions are answered and there is agreement with the plan.     Critical Care Time: No Critical Care Time  Total time spent on critical procedures is separate from the time billed for critical care.    _______________________________    Attestations:     I was acting as a scribe for Steve Rattler, MD on [name]  Treatment Team: Scribe: Christin Fudge   I am the first provider for this patient and I personally performed the services documented. Treatment Team: Scribe: Christin Fudge is scribing for me on [name]. This note accurately reflects work and decisions made by me.  Steve Rattler, MD    _______________________________          Oneal Deputy, MD  12/29/12 [phone]

## 2012-12-29 NOTE — ED Provider Notes (Signed)
ATTENDING NOTE    I have reviewed and agree with history. The pertinent physical exam has been documented., The patient was seen and examined by PA or Fellow; plan of care was discussed with me.           Donny Pique, MD  12/29/12 1121

## 2013-07-02 ENCOUNTER — Emergency Department: Payer: Medicare Other

## 2013-07-02 ENCOUNTER — Emergency Department
Admission: EM | Admit: 2013-07-02 | Discharge: 2013-07-02 | Disposition: A | Discharge status: Home / Self Care | Payer: Medicare (Managed Care)

## 2013-07-02 DIAGNOSIS — R739 Hyperglycemia, unspecified: Secondary | ICD-10-CM

## 2013-07-02 DIAGNOSIS — E119 Type 2 diabetes mellitus without complications: Secondary | ICD-10-CM | POA: Insufficient documentation

## 2013-07-02 DIAGNOSIS — J45901 Unspecified asthma with (acute) exacerbation: Secondary | ICD-10-CM | POA: Insufficient documentation

## 2013-07-02 DIAGNOSIS — T7840XA Allergy, unspecified, initial encounter: Secondary | ICD-10-CM | POA: Insufficient documentation

## 2013-07-02 DIAGNOSIS — J4531 Mild persistent asthma with (acute) exacerbation: Secondary | ICD-10-CM

## 2013-07-02 DIAGNOSIS — Z87891 Personal history of nicotine dependence: Secondary | ICD-10-CM | POA: Insufficient documentation

## 2013-07-02 HISTORY — DX: Panic disorder (episodic paroxysmal anxiety): F41.0

## 2013-07-02 LAB — CBC AND DIFFERENTIAL
Basophils Absolute Automated: 0.01 10*3/uL (ref 0.00–0.20)
Basophils Automated: 0 %
Eosinophils Absolute Automated: 0.13 10*3/uL (ref 0.00–0.70)
Eosinophils Automated: 2 %
Hematocrit: 34.3 % — ABNORMAL LOW (ref 37.0–47.0)
Hgb: 11.7 g/dL — ABNORMAL LOW (ref 12.0–16.0)
Immature Granulocytes Absolute: 0.01 10*3/uL
Immature Granulocytes: 0 %
Lymphocytes Absolute Automated: 2.9 10*3/uL (ref 0.50–4.40)
Lymphocytes Automated: 47 %
MCH: 26.6 pg — ABNORMAL LOW (ref 28.0–32.0)
MCHC: 34.1 g/dL (ref 32.0–36.0)
MCV: 78 fL — ABNORMAL LOW (ref 80.0–100.0)
MPV: 9.6 fL (ref 9.4–12.3)
Monocytes Absolute Automated: 0.26 10*3/uL (ref 0.00–1.20)
Monocytes: 4 %
Neutrophils Absolute: 2.87 10*3/uL (ref 1.80–8.10)
Neutrophils: 46 %
Nucleated RBC: 0 (ref 0–1)
Platelets: 297 10*3/uL (ref 140–400)
RBC: 4.4 10*6/uL (ref 4.20–5.40)
RDW: 14 % (ref 12–15)
WBC: 6.17 10*3/uL (ref 3.50–10.80)

## 2013-07-02 LAB — ECG 12-LEAD
Atrial Rate: 70 {beats}/min
P Axis: 50 degrees
P-R Interval: 160 ms
Q-T Interval: 398 ms
QRS Duration: 98 ms
QTC Calculation (Bezet): 429 ms
R Axis: 12 degrees
T Axis: 25 degrees
Ventricular Rate: 70 {beats}/min

## 2013-07-02 LAB — COMPREHENSIVE METABOLIC PANEL
ALT: 13 U/L (ref 0–55)
AST (SGOT): 13 U/L (ref 5–34)
Albumin/Globulin Ratio: 1 (ref 0.9–2.2)
Albumin: 3.5 g/dL (ref 3.5–5.0)
Alkaline Phosphatase: 88 U/L (ref 40–150)
BUN: 11 mg/dL (ref 7–21)
Bilirubin, Total: 0.5 mg/dL (ref 0.2–1.2)
CO2: 22 mEq/L (ref 22–29)
Calcium: 9.3 mg/dL (ref 8.5–10.5)
Chloride: 103 mEq/L (ref 98–107)
Creatinine: 0.8 mg/dL (ref 0.6–1.0)
Globulin: 3.6 g/dL (ref 2.0–3.6)
Glucose: 322 mg/dL — ABNORMAL HIGH (ref 70–100)
Potassium: 3.9 mEq/L (ref 3.5–5.1)
Protein, Total: 7.1 g/dL (ref 6.0–8.3)
Sodium: 136 mEq/L (ref 136–145)

## 2013-07-02 LAB — URINALYSIS, REFLEX TO MICROSCOPIC EXAM IF INDICATED
Bilirubin, UA: NEGATIVE
Blood, UA: NEGATIVE
Glucose, UA: >=500 — AB
Ketones UA: NEGATIVE
Leukocyte Esterase, UA: NEGATIVE
Nitrite, UA: NEGATIVE
Protein, UR: NEGATIVE
Specific Gravity UA: 1.03 (ref 1.001–1.035)
Urine pH: 6 (ref 5.0–8.0)
Urobilinogen, UA: NEGATIVE mg/dL

## 2013-07-02 LAB — GFR: EGFR: >60

## 2013-07-02 LAB — URINE HCG QUALITATIVE: Urine HCG Qualitative: NEGATIVE

## 2013-07-02 LAB — GLUCOSE WHOLE BLOOD - POCT: Whole Blood Glucose POCT: 309 mg/dL — ABNORMAL HIGH (ref 70–100)

## 2013-07-02 MED ORDER — LISINOPRIL 5 MG PO TABS
20.0000 mg | ORAL_TABLET | Freq: Every day | ORAL | Status: AC
Start: 2013-07-02 — End: 2013-08-01

## 2013-07-02 MED ORDER — IPRATROPIUM BROMIDE 0.02 % IN SOLN
1.0000 mg | Freq: Once | RESPIRATORY_TRACT | Status: AC
Start: 2013-07-02 — End: 2013-07-02
  Administered 2013-07-02: 1 mg via RESPIRATORY_TRACT
  Filled 2013-07-02: qty 5

## 2013-07-02 MED ORDER — DIPHENHYDRAMINE HCL 25 MG PO TABS
25.0000 mg | ORAL_TABLET | Freq: Four times a day (QID) | ORAL | Status: DC | PRN
Start: 2013-07-02 — End: 2013-07-16

## 2013-07-02 MED ORDER — SODIUM CHLORIDE 0.9 % IV BOLUS
1000.0000 mL | Freq: Once | INTRAVENOUS | Status: AC
Start: 2013-07-02 — End: 2013-07-02
  Administered 2013-07-02: 1000 mL via INTRAVENOUS

## 2013-07-02 MED ORDER — FAMOTIDINE 20 MG PO TABS
20.00 mg | ORAL_TABLET | Freq: Once | ORAL | Status: AC
Start: 2013-07-02 — End: 2013-07-02
  Administered 2013-07-02: 20 mg via ORAL
  Filled 2013-07-02: qty 1

## 2013-07-02 MED ORDER — ALBUTEROL SULFATE (2.5 MG/3ML) 0.083% IN NEBU
7.50 mg | INHALATION_SOLUTION | Freq: Once | RESPIRATORY_TRACT | Status: AC
Start: 2013-07-02 — End: 2013-07-02
  Administered 2013-07-02: 7.5 mg via RESPIRATORY_TRACT
  Filled 2013-07-02: qty 9

## 2013-07-02 MED ORDER — PROCHLORPERAZINE EDISYLATE 5 MG/ML IJ SOLN
10.0000 mg | Freq: Once | INTRAMUSCULAR | Status: AC
Start: 2013-07-02 — End: 2013-07-02
  Administered 2013-07-02: 10 mg via INTRAVENOUS
  Filled 2013-07-02: qty 2

## 2013-07-02 MED ORDER — ACETAMINOPHEN 325 MG PO TABS
975.0000 mg | ORAL_TABLET | Freq: Once | ORAL | Status: AC
Start: 2013-07-02 — End: 2013-07-02
  Administered 2013-07-02: 975 mg via ORAL
  Filled 2013-07-02: qty 3

## 2013-07-02 MED ORDER — KETOROLAC TROMETHAMINE 30 MG/ML IJ SOLN
30.00 mg | Freq: Once | INTRAMUSCULAR | Status: AC
Start: 2013-07-02 — End: 2013-07-02
  Administered 2013-07-02: 30 mg via INTRAVENOUS
  Filled 2013-07-02: qty 1

## 2013-07-02 MED ORDER — DIPHENHYDRAMINE HCL 25 MG PO CAPS
25.0000 mg | ORAL_CAPSULE | Freq: Once | ORAL | Status: AC
Start: 2013-07-02 — End: 2013-07-02
  Administered 2013-07-02: 25 mg via ORAL
  Filled 2013-07-02: qty 1

## 2013-07-02 MED ORDER — METFORMIN HCL 500 MG PO TABS
1000.0000 mg | ORAL_TABLET | Freq: Two times a day (BID) | ORAL | Status: DC
Start: 2013-07-02 — End: 2015-04-17

## 2013-07-02 MED ORDER — LISINOPRIL 10 MG PO TABS
20.00 mg | ORAL_TABLET | Freq: Once | ORAL | Status: AC
Start: 2013-07-02 — End: 2013-07-02
  Administered 2013-07-02: 20 mg via ORAL
  Filled 2013-07-02: qty 2

## 2013-07-02 MED ORDER — METFORMIN HCL 500 MG PO TABS
1000.0000 mg | ORAL_TABLET | Freq: Once | ORAL | Status: AC
Start: 2013-07-02 — End: 2013-07-02
  Administered 2013-07-02: 1000 mg via ORAL
  Filled 2013-07-02: qty 2

## 2013-07-02 MED ORDER — ALBUTEROL SULFATE HFA 108 (90 BASE) MCG/ACT IN AERS
2.0000 | INHALATION_SPRAY | RESPIRATORY_TRACT | Status: AC | PRN
Start: 2013-07-02 — End: 2014-07-02

## 2013-07-02 NOTE — ED Provider Notes (Signed)
Physician/Midlevel provider first contact with patient: 07/02/13 0845         History     Chief Complaint   Patient presents with   . Allergic Reaction     Patient is a 38 y.o. female presenting with allergic reaction. The history is provided by the patient, the EMS personnel and medical records.   Allergic Reaction  The primary symptoms are  wheezing, cough, palpitations and urticaria. The primary symptoms do not include abdominal pain or dizziness. The current episode started less than 1 hour ago. The problem has been gradually worsening. This is a new problem.   The palpitations did not occur with syncope or dizziness.   Significant symptoms also include itching.   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  Had cp and palpitations and wheezing and sob pta.  States she still has itching but other sx better.    Past Medical History   Diagnosis Date   . Diabetes mellitus without complication    . Asthma without status asthmaticus    . Anemia    . Depression    . Anemia    . Panic attack        Past Surgical History   Procedure Date   . Cesarean section    . Cholecystectomy    . Tonsillectomy    . Hernia repair        Family History   Problem Relation Age of Onset   . Diabetes Mother    . Diabetes Sister    . Diabetes Maternal Aunt    . Hypertension Paternal Grandmother        Social  History   Substance Use Topics   . Smoking status: Former Games developer   . Smokeless tobacco: Not on file   . Alcohol Use: No       .     Allergies   Allergen Reactions   . Percocet (Oxycodone-Acetaminophen)      Unknown reaction     . Tape        Current/Home Medications    GLIPIZIDE PO    Take by mouth.    HYDROCODONE-ACETAMINOPHEN (NORCO) 5-325 MG PER TABLET    Take 1-2 tablets by mouth every 6 (six) hours as needed for Pain.    HYDROCODONE-ACETAMINOPHEN (NORCO) 5-325 MG PER TABLET     Take 1 tablet by mouth every 6 (six) hours as needed for Pain.    IBUPROFEN (ADVIL,MOTRIN) 600 MG TABLET    Take 1 tablet (600 mg total) by mouth every 6 (six) hours as needed for Pain or Fever.    IBUPROFEN (ADVIL,MOTRIN) 600 MG TABLET    Take 1 tablet (600 mg total) by mouth every 6 (six) hours as needed for Pain.    LISINOPRIL (PRINIVIL,ZESTRIL) 20 MG TABLET    Take 20 mg by mouth daily.    NAPROXEN (NAPROSYN) 250 MG TABLET    Take 2 tablets (500 mg total) by mouth 2 (two) times daily with meals.    SITAGLIPTIN-METFORMIN (JANUMET) 50-1000 MG PER TABLET    Take 1 tablet by mouth 2 (two) times daily with meals.    UNKNOWN TO PATIENT            Review of Systems   Constitutional: Negative for fever.   Respiratory: Positive for cough, chest  tightness and wheezing.    Cardiovascular: Positive for palpitations. Negative for chest pain and leg swelling.   Gastrointestinal: Negative for abdominal pain.   Genitourinary: Negative for dysuria.   Skin: Positive for itching.   Neurological: Positive for headaches. Negative for dizziness, syncope, speech difficulty and numbness.       Physical Exam    BP: 128/81 mmHg, Heart Rate: 80 , Temp: 97.9 F (36.6 C), Resp Rate: 16 , SpO2: 98 %, Weight: 138.438 kg    Physical Exam  Nursing note and vitals reviewed.  Constitutional:  Well developed, well nourished. Awake & Oriented x3.  Head:  Atraumatic. Normocephalic.    Eyes:  PERRL. EOMI. Conjunctivae are not pale.  ENT:  Mucous membranes are moist and intact. Oropharynx is clear and symmetric.  Patent airway.  Neck:  Supple. Full ROM.    Cardiovascular:  Regular rate. Regular rhythm. No murmurs, rubs, or gallops.  Pulmonary/Chest:  No evidence of respiratory distress. Clear to auscultation bilaterally.  No wheezing, rales or rhonchi.   Abdominal:  Soft and non-distended. There is no tenderness. No rebound, guarding, or rigidity.  Back:  Full ROM. Nontender.  Extremities:  No edema. No cyanosis. No clubbing. Full range of motion in  all extremities.  Skin:  Skin is warm and dry.  No diaphoresis. No rash.   Neurological:  Alert, awake, and appropriate. Normal speech. Motor normal.  Psychiatric:  Good eye contact. Normal interaction, affect, and behavior.    Pox   MDM and ED Course     ED Medication Orders      Start     Status Ordering Provider    07/02/13 1000   sodium chloride 0.9 % bolus 1,000 mL   Once      Route: Intravenous  Ordered Dose: 1,000 mL         Last MAR action:  [place] Rolin Schult C    07/02/13 1000   acetaminophen (TYLENOL) tablet 975 mg   Once in ED      Route: Oral  Ordered Dose: 975 mg         Last MAR action:  Given Onesha Krebbs C    07/02/13 1000   ketorolac (TORADOL) injection 30 mg   Once      Route: Intravenous  Ordered Dose: 30 mg         Last MAR action:  Given Anquanette Bahner C    07/02/13 1000   prochlorperazine (COMPAZINE) injection 10 mg   Once      Route: Intravenous  Ordered Dose: 10 mg         Last MAR action:  Given Kable Haywood C    07/02/13 0957   famotidine (PEPCID) tablet 20 mg   Once      Route: Oral  Ordered Dose: 20 mg         Last MAR action:  Given Mikaele Stecher C    07/02/13 0957   diphenhydrAMINE (BENADRYL) capsule 25 mg   Once in ED      Route: Oral  Ordered Dose: 25 mg         Last MAR action:  Given Kinzlee Selvy C    07/02/13 0956   albuterol (PROVENTIL) nebulizer solution 7.5 mg   RT - Once      Route: Nebulization  Ordered Dose: 7.5 mg         Last MAR action:  Given Syniah Berne C    07/02/13 0956   ipratropium (  ATROVENT) 0.02 % nebulizer solution 1 mg   RT - Once      Route: Nebulization  Ordered Dose: 1 mg         Last MAR action:  Given Aylene Acoff C    07/02/13 0956   lisinopril (PRINIVIL,ZESTRIL) tablet 20 mg   Once      Route: Oral  Ordered Dose: 20 mg         Last MAR action:  Given Tavion Senkbeil C    07/02/13 0955   metFORMIN (GLUCOPHAGE) tablet 1,000 mg   Once      Route: Oral  Ordered Dose: 1,000 mg         Last MAR action:  Given Arianni Gallego C                Results for orders placed during the hospital encounter of 07/02/13   URINALYSIS, REFLEX TO MICROSCOPIC EXAM IF INDICATED       Component Value Range    Urine Type Clean Catch      Color, UA Yellow  Clear - Yellow    Clarity, UA Clear  Clear - Hazy    Specific Gravity UA 1.030  1.001-1.035    Urine pH 6.0  5.0-8.0    Leukocyte Esterase, UA Negative  Negative    Nitrite, UA Negative  Negative    Protein, UR Negative  Negative    Glucose, UA >=500 (*) Negative    Ketones UA Negative  Negative    Urobilinogen, UA Negative  0.2  -  2.0 mg/dL    Bilirubin, UA Negative  Negative    Blood, UA Negative  Negative   URINE HCG QUALITATIVE       Component Value Range    Urine HCG Qualitative Negative  Negative   GLUCOSE WHOLE BLOOD - POCT       Component Value Range    POCT - Glucose Whole blood 309 (*) 70 - 100 mg/dL   CBC AND DIFFERENTIAL       Component Value Range    WBC 6.17  3.50 - 10.80 x10 3/uL    RBC 4.40  4.20 - 5.40 x10 6/uL    Hgb 11.7 (*) 12.0 - 16.0 g/dL    Hematocrit 78.2 (*) 37.0 - 47.0 %    MCV 78.0 (*) 80.0 - 100.0 fL    MCH 26.6 (*) 28.0 - 32.0 pg    MCHC 34.1  32.0 - 36.0 g/dL    RDW 14  12 - 15 %    Platelets 297  140 - 400 x10 3/uL    MPV 9.6  9.4 - 12.3 fL    Neutrophils 46  None %    Lymphocytes Automated 47  None %    Monocytes 4  None %    Eosinophils Automated 2  None %    Basophils Automated 0  None %    Immature Granulocyte 0  None %    Nucleated RBC 0  0 - 1    Neutrophils Absolute 2.87  1.80 - 8.10 x10 3/uL    Abs Lymph Automated 2.90  0.50 - 4.40 x10 3/uL    Abs Mono Automated 0.26  0.00 - 1.20 x10 3/uL    Abs Eos Automated 0.13  0.00 - 0.70 x10 3/uL    Absolute Baso Automated 0.01  0.00 - 0.20 x10 3/uL    Absolute Immature Granulocyte 0.01  0 x10 3/uL    Narrative:  redraw   COMPREHENSIVE METABOLIC PANEL       Component Value Range    Glucose 322 (*) 70 - 100 mg/dL    BUN 11  7 - 21 mg/dL    Creatinine 0.8  0.6 - 1.0 mg/dL    Sodium 161  096 - 045 mEq/L    Potassium 3.9  3.5 - 5.1 mEq/L     Chloride 103  98 - 107 mEq/L    CO2 22  22 - 29 mEq/L    CALCIUM 9.3  8.5 - 10.5 mg/dL    Protein, Total 7.1  6.0 - 8.3 g/dL    Albumin 3.5  3.5 - 5.0 g/dL    AST (SGOT) 13  5 - 34 U/L    ALT 13  0 - 55 U/L    Alkaline Phosphatase 88  40 - 150 U/L    Bilirubin, Total 0.5  0.2 - 1.2 mg/dL    Globulin 3.6  2.0 - 3.6 g/dL    Albumin/Globulin Ratio 1.0  0.9 - 2.2    Narrative:     redraw   GFR       Component Value Range    EGFR >60.0      Narrative:     redraw       MDM  Ekg:  Read by me nsr 70.  Normal axis and intervals.  No significant stt changes.  Meds given to treat sx related to asthma, allergy, and hyperglycemia.  I do not feel patient has life threatening problem so as she feels much better will [place] to fu local MD.  Seems very reliable.  Asked to return at once if worse.  Pt out of all meds so rx given.  Procedures    Clinical Impression & Disposition     Clinical Impression  Final diagnoses:   Allergic reaction, initial encounter   Asthma exacerbation, mild persistent   Hyperglycemia        ED Disposition     Discharge Allegra Grana discharge to home/self care.    Condition at disposition: Stable             New Prescriptions    ALBUTEROL (PROAIR HFA) 108 (90 BASE) MCG/ACT INHALER    Inhale 2 puffs into the lungs every 4 (four) hours as needed for Wheezing.    DIPHENHYDRAMINE (BENADRYL) 25 MG TABLET    Take 1 tablet (25 mg total) by mouth every 6 (six) hours as needed for Itching.    LISINOPRIL (PRINIVIL,ZESTRIL) 5 MG TABLET    Take 4 tablets (20 mg total) by mouth daily.    METFORMIN (GLUCOPHAGE) 500 MG TABLET    Take 2 tablets (1,000 mg total) by mouth 2 (two) times daily with meals.                 Kennith Maes, MD  07/03/13 [phone]

## 2013-07-02 NOTE — ED Notes (Signed)
Bed:M 17<BR> Expected date:<BR> Expected time:<BR> Means of arrival:<BR> Comments:<BR> Medic 420

## 2013-07-02 NOTE — ED Notes (Signed)
Pt arrives via EMS for reaction to cleaning products starting 0700, foot pain starting 06/25/13, and chest pain starting in ambulance on way here.  EMS states DEXI 336.

## 2013-07-02 NOTE — ED Notes (Signed)
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 She reported she had an allergy and BIBA as her right forearm has some markings. An insect bite[s]. Not red nor open to the enviroment.

## 2013-07-02 NOTE — Discharge Instructions (Signed)
Asthma     You have been seen for an asthma attack.     Asthma is also called reactive airway disease.     This disease occurs when the breathing tubes are irritated and begin to spasm. The irritation causes the tubes to swell and make extra mucus. The spasms make it difficult to breathe, especially when exhaling (breathing out). Some medications treat the spasm. An example is bronchodilators, like Albuterol. Other medications treat the inflammation. An example is steroids, like Prednisone or Prednisolone.      Symptoms may include wheezing, dry coughing, difficulty breathing, and chest pain and tightness. Coughing until you vomit may also be a sign of asthma.     Asthma attacks can be caused by medications (like aspirin), smoke, air pollution, exercise, dust mites or pet dander.     Asthma attacks are serious. They can be life-threatening!     Do not smoke. Research has proven that smoking causes heart disease, cancer, and birth defects. Avoiding smoking will help your asthma. If you smoke, ask your doctor for ideas about how to stop.  · If you do not smoke, avoid others who do. Smoke will irritate your lungs and make your asthma worse.     Avoid things that irritate your lungs. These might include smoke, pollen, dust, mold, mildew, pets and perfumes. If you are allergic to pollen, then stay indoors when pollen counts are high.     Work closely with your doctor to control your asthma.     Use your Albuterol inhaler every 4 hours as needed for wheezing, coughing or shortness of breath. Using this medication as directed will help symptoms. It is best to use a spacer to help the medicine reach your lungs. Your doctor can prescribe a spacer.     Discuss with your doctor how to measure your breathing with a handheld peak flow meter. This will help your doctor to adjust your treatment.     Do not wait until your inhaler is empty before getting a refill. To check how full your inhaler is, remove the mouthpiece and place  the inhaler in a bowl of water. If it floats near the surface of the water with the nipple end pointing down, it is half full. If it floats flat on the surface, it is empty. Call your doctor immediately for a refill.     IT IS VERY IMPORTANT to use the inhaler as instructed. Using it too much can cause serious problems.     YOU SHOULD SEEK MEDICAL ATTENTION IMMEDIATELY, EITHER HERE OR AT THE NEAREST EMERGENCY DEPARTMENT, IF ANY OF THE FOLLOWING OCCURS:  · You do not improve in 48 to 72 hours or your symptoms get worse.  · You vomit, you cannot keep medication down, or you feel dizzy, weak, or confused.  · Your shortness of breath gets worse.  · Your inhaler does not help your breathing.  · You feel chest pain.  · You cough up green or yellow material.  · You have a fever higher than 101° F (38.3° C).  · You wheeze or have difficulty breathing.

## 2013-07-11 ENCOUNTER — Emergency Department
Admission: EM | Admit: 2013-07-11 | Discharge: 2013-07-11 | Disposition: A | Discharge status: Home / Self Care | Payer: Medicare (Managed Care) | Attending: Emergency Medicine | Admitting: Emergency Medicine

## 2013-07-11 ENCOUNTER — Emergency Department: Payer: Medicare Other

## 2013-07-11 DIAGNOSIS — O09529 Supervision of elderly multigravida, unspecified trimester: Secondary | ICD-10-CM | POA: Insufficient documentation

## 2013-07-11 DIAGNOSIS — L259 Unspecified contact dermatitis, unspecified cause: Secondary | ICD-10-CM

## 2013-07-11 DIAGNOSIS — E119 Type 2 diabetes mellitus without complications: Secondary | ICD-10-CM | POA: Insufficient documentation

## 2013-07-11 DIAGNOSIS — O99891 Other specified diseases and conditions complicating pregnancy: Secondary | ICD-10-CM | POA: Insufficient documentation

## 2013-07-11 DIAGNOSIS — J45909 Unspecified asthma, uncomplicated: Secondary | ICD-10-CM | POA: Insufficient documentation

## 2013-07-11 DIAGNOSIS — O24919 Unspecified diabetes mellitus in pregnancy, unspecified trimester: Secondary | ICD-10-CM | POA: Insufficient documentation

## 2013-07-11 MED ORDER — HYDROCORTISONE 1 % EX OINT
TOPICAL_OINTMENT | Freq: Two times a day (BID) | CUTANEOUS | Status: DC
Start: 2013-07-11 — End: 2013-07-16

## 2013-07-11 MED ORDER — DIPHENHYDRAMINE HCL 25 MG PO TABS
25.0000 mg | ORAL_TABLET | Freq: Four times a day (QID) | ORAL | Status: DC | PRN
Start: 2013-07-11 — End: 2013-07-16

## 2013-07-11 NOTE — ED Provider Notes (Signed)
Physician/Midlevel provider first contact with patient: 07/11/13 2109         EMERGENCY DEPARTMENT NOTE    Physician/Midlevel provider first contact with patient: 07/11/13 2109         HISTORY OF PRESENT ILLNESS   Historian:Patient  Translator Used: No    Chief Complaint: Rash       38 y.o. female reports a rash to b/l neck which is pruritic with pain beginning yesterday. Denies f/c, , pain prior to rash onset. Reports she wore a neclace her husband gave her yesterday which may have caused the rash. No diffuse rash, new soap, detergent, contacts with rash.  Patient is approximately 5 months pregnant without n/v, abdominal pain. Has a recent Ob appointment coming up.     1. Location of symptoms: B/l neck R side more than left  2. Onset of symptoms: 1 day  3. What was patient doing when symptoms started (Context):Nothing  4. Severity: Mild  5. Timing: Constant  6. Activities that worsen symptoms: Nothing  7. Activities that improve symptoms: Nothing. No medications taken prior to arrival  8. Quality: Pruritic  9. Radiation of symptoms:None  10. Associated signs and Symptoms:None  11. Are symptoms worsening?No  MEDICAL HISTORY     Past Medical History:  Past Medical History   Diagnosis Date   . Diabetes mellitus without complication    . Asthma without status asthmaticus    . Anemia    . Depression    . Anemia    . Panic attack        Past Surgical History:  Past Surgical History   Procedure Date   . Cesarean section    . Cholecystectomy    . Tonsillectomy    . Hernia repair        Social History:  History     Social History   . Marital Status: Significant Other     Spouse Name: N/A     Number of Children: N/A   . Years of Education: N/A     Occupational History   . Not on file.     Social History Main Topics   . Smoking status: Former Games developer   . Smokeless tobacco: Not on file   . Alcohol Use: No   . Drug Use: No   . Sexually Active: Not on file     Other Topics Concern   . Not on file     Social History Narrative   .  No narrative on file       Family History:  Family History   Problem Relation Age of Onset   . Diabetes Mother    . Diabetes Sister    . Diabetes Maternal Aunt    . Hypertension Paternal Grandmother        Outpatient Medication:  Previous Medications    ALBUTEROL (PROAIR HFA) 108 (90 BASE) MCG/ACT INHALER    Inhale 2 puffs into the lungs every 4 (four) hours as needed for Wheezing.    DIPHENHYDRAMINE (BENADRYL) 25 MG TABLET    Take 1 tablet (25 mg total) by mouth every 6 (six) hours as needed for Itching.    GLIPIZIDE PO    Take by mouth.    HYDROCODONE-ACETAMINOPHEN (NORCO) 5-325 MG PER TABLET    Take 1-2 tablets by mouth every 6 (six) hours as needed for Pain.    HYDROCODONE-ACETAMINOPHEN (NORCO) 5-325 MG PER TABLET    Take 1 tablet by mouth every 6 (six) hours as needed for  Pain.    IBUPROFEN (ADVIL,MOTRIN) 600 MG TABLET    Take 1 tablet (600 mg total) by mouth every 6 (six) hours as needed for Pain or Fever.    IBUPROFEN (ADVIL,MOTRIN) 600 MG TABLET    Take 1 tablet (600 mg total) by mouth every 6 (six) hours as needed for Pain.    LISINOPRIL (PRINIVIL,ZESTRIL) 20 MG TABLET    Take 20 mg by mouth daily.    LISINOPRIL (PRINIVIL,ZESTRIL) 5 MG TABLET    Take 4 tablets (20 mg total) by mouth daily.    METFORMIN (GLUCOPHAGE) 500 MG TABLET    Take 2 tablets (1,000 mg total) by mouth 2 (two) times daily with meals.    NAPROXEN (NAPROSYN) 250 MG TABLET    Take 2 tablets (500 mg total) by mouth 2 (two) times daily with meals.    SITAGLIPTIN-METFORMIN (JANUMET) 50-1000 MG PER TABLET    Take 1 tablet by mouth 2 (two) times daily with meals.    UNKNOWN TO PATIENT             REVIEW OF SYSTEMS   Review of Systems   Constitutional: Negative for fever and chills.   Gastrointestinal: Negative for nausea, vomiting and abdominal pain.   Skin: Positive for itching and rash.   All other systems reviewed and are negative.        PHYSICAL EXAM   ED Triage Vitals   Enc Vitals Group      BP 07/11/13 1925 144/76 mmHg      Heart Rate  07/11/13 1925 83       Resp Rate 07/11/13 1925 18       Temp 07/11/13 1925 98.1 F (36.7 C)      Temp src --       SpO2 07/11/13 1925 98 %      Weight 07/11/13 1925 104.327 kg      Height 07/11/13 1925 1.803 m      Head Cir --       Peak Flow --       Pain Score --       Pain Loc --       Pain Edu? --       Excl. in GC? --      Nursing note and vitals reviewed.  Constitutional:  Well developed, well nourished. Awake & Oriented x3. No distress  Head:  Atraumatic. Normocephalic.    Eyes:  PERRL. EOMI. Conjunctivae are not pale.  ENT:  Mucous membranes are moist and intact. Oropharynx is clear and symmetric.  Patent airway.  Neck:  Supple. Full ROM.  Papular,red rash along base of neck R>L side with minimal excoriation. No vesicles present   Cardiovascular:  Regular rate. Regular rhythm. No murmurs, rubs, or gallops.  Pulmonary/Chest:  No evidence of respiratory distress. Clear to auscultation bilaterally.  No wheezing, rales or rhonchi.   Abdominal:  Soft and non-distended. There is no tenderness. No rebound, guarding, or rigidity.  Extremities:  No edema. No cyanosis. No clubbing. Full range of motion in all extremities.  Skin:  Skin is warm and dry.  No diaphoresis. Rash as above, No diffuse rash  Neurological:  Alert, awake, and appropriate. Normal speech. Motor normal.  Psychiatric:  Good eye contact. Normal interaction, affect, and behavior.        MEDICAL DECISION MAKING     DISCUSSION      Vital Signs: Reviewed the patient?s vital signs.   Nursing Notes: Reviewed and utilized available nursing  notes.  Medical Records Reviewed: Reviewed available past medical records.  Counseling: The emergency provider has spoken with the patient and discussed today?s findings, in addition to providing specific details for the plan of care.  Questions are answered and there is agreement with the plan.      Patient given a Rx for hydrocortisone and benadryl as needed for pruritis. Advised to follow up with her primary care  provider and return if rash spreads or associated cp, sob, dizziness, facial swelling    CARDIAC STUDIES    The following cardiac studies were independently interpreted by the Emergency Medicine Physician.  For full cardiac study results please see chart.      IMAGING STUDIES    The following imaging studies were independently interpreted by the Emergency Medicine Physician.  For full imaging study results please see chart.  None      PULSE OXIMETRY    Oxygen Saturation by Pulse Oximetry: 99%  Interventions:   Interpretation: Appropriate    EMERGENCY DEPT. MEDICATIONS      ED Medication Orders     None          LABORATORY RESULTS    Ordered and independently interpreted AVAILABLE laboratory tests. Please see results section in chart for full details.      DIAGNOSIS      Diagnosis:  Final diagnoses:   Contact dermatitis       Disposition:  ED Disposition     Discharge Dorothy [name] discharge to home/self care.    Condition at disposition: Stable            Prescriptions:  Patient's Medications   New Prescriptions    DIPHENHYDRAMINE (BENADRYL) 25 MG TABLET    Take 1 tablet (25 mg total) by mouth every 6 (six) hours as needed for Itching.    HYDROCORTISONE 1 % OINTMENT    Apply topically 2 (two) times daily.   Previous Medications    ALBUTEROL (PROAIR HFA) 108 (90 BASE) MCG/ACT INHALER    Inhale 2 puffs into the lungs every 4 (four) hours as needed for Wheezing.    DIPHENHYDRAMINE (BENADRYL) 25 MG TABLET    Take 1 tablet (25 mg total) by mouth every 6 (six) hours as needed for Itching.    GLIPIZIDE PO    Take by mouth.    HYDROCODONE-ACETAMINOPHEN (NORCO) 5-325 MG PER TABLET    Take 1-2 tablets by mouth every 6 (six) hours as needed for Pain.    HYDROCODONE-ACETAMINOPHEN (NORCO) 5-325 MG PER TABLET    Take 1 tablet by mouth every 6 (six) hours as needed for Pain.    IBUPROFEN (ADVIL,MOTRIN) 600 MG TABLET    Take 1 tablet (600 mg total) by mouth every 6 (six) hours as needed for Pain or Fever.    IBUPROFEN  (ADVIL,MOTRIN) 600 MG TABLET    Take 1 tablet (600 mg total) by mouth every 6 (six) hours as needed for Pain.    LISINOPRIL (PRINIVIL,ZESTRIL) 20 MG TABLET    Take 20 mg by mouth daily.    LISINOPRIL (PRINIVIL,ZESTRIL) 5 MG TABLET    Take 4 tablets (20 mg total) by mouth daily.    METFORMIN (GLUCOPHAGE) 500 MG TABLET    Take 2 tablets (1,000 mg total) by mouth 2 (two) times daily with meals.    NAPROXEN (NAPROSYN) 250 MG TABLET    Take 2 tablets (500 mg total) by mouth 2 (two) times daily with meals.    SITAGLIPTIN-METFORMIN (JANUMET) 50-1000 MG PER  TABLET    Take 1 tablet by mouth 2 (two) times daily with meals.    UNKNOWN TO PATIENT       Modified Medications    No medications on file   Discontinued Medications    No medications on file         Raliegh Ip, DO  07/12/13 1610

## 2013-07-11 NOTE — ED Notes (Signed)
Onset today rash to neck; itchiness and pain; no resp distress; denies new linen/food/clothes/soap; pt does not recall last menstrual period

## 2013-07-11 NOTE — Discharge Instructions (Signed)
Contact Dermatitis    You have been diagnosed with contact dermatitis.    Contact dermatitis is an irritation of the skin caused by contact with something. Sometimes the rash is due to an allergic reaction and sometimes the rash is due to simple chemical irritation. The symptoms include itching and redness of the skin, along with blistering in more severe cases. Although this condition is rarely dangerous, it can be very frustrating.    Examples of items which can cause contact dermatitis include detergents, soaps, shampoos, nickel (found in jewelry), poison ivy/oak, wool, clothing starch, cleaning fluids, etc.    In many cases, the item that caused the rash cannot be identified. If you have many episodes of contact dermatitis, you should see a dermatologist (skin doctor) or allergist to undergo testing to determine the cause of your reaction. If you are fortunate enough to know what caused your rash, you will be able to avoid that substance in the future.    The general treatment for this condition includes:   Use of antihistamines (anti-itch medicines). Antihistamines such as over-the-counter diphenhydramine (Benadryl: 1-2 (25 mg) tablets every 6 hours) or prescription strength hydroxyzine (Atarax) can be used to help limit your itching. Because antihistamines can make you drowsy, use caution and avoid them if you must be alert (ie: On the job or when driving).   Use of a steroid (cortisone) cream. Steroid creams should be applied sparingly (in small amounts) 2 times each day to the affected areas. These creams can be used until the skin irritation is gone and the skin feels normal to the touch. Steroid creams should be applied to clean, damp skin and then covered with a thick layer of moisturizer. As a general rule, steroid cream should not be used on the face for longer than 7 days or on other areas of the body for longer than 14 days, unless your doctor tells you otherwise.   Use of moisturizers. Skin  moisturizers should be applied many times per day- 5 times or more is reasonable. A good moisturizer should "seal in" water and moisture to the skin. They should be used directly after (on top of) steroid creams, and then additionally (alone) many more times to keep the skin from looking and feeling dry. You should use greasy, stiff moisturizes that are too thick to pour. Eucerin cream (not lotion) is an example of a good over-the-counter brand. Vaseline and even Crisco are good examples of less expensive but good skin moisturizers.    YOU SHOULD SEEK MEDICAL ATTENTION IMMEDIATELY, EITHER HERE OR AT THE NEAREST EMERGENCY DEPARTMENT, IF ANY OF THE FOLLOWING OCCURS:   The symptoms become worse even with treatment.   You develop signs of infection at the affected areas (redness, pus, pain, swelling or fever).

## 2013-07-16 ENCOUNTER — Emergency Department: Payer: Medicare (Managed Care)

## 2013-07-16 ENCOUNTER — Emergency Department
Admission: EM | Admit: 2013-07-16 | Discharge: 2013-07-17 | Disposition: A | Discharge status: Home / Self Care | Payer: Medicare (Managed Care) | Attending: Emergency Medicine | Admitting: Emergency Medicine

## 2013-07-16 ENCOUNTER — Emergency Department: Payer: Medicare Other

## 2013-07-16 DIAGNOSIS — R109 Unspecified abdominal pain: Secondary | ICD-10-CM | POA: Insufficient documentation

## 2013-07-16 DIAGNOSIS — Z87891 Personal history of nicotine dependence: Secondary | ICD-10-CM | POA: Insufficient documentation

## 2013-07-16 DIAGNOSIS — IMO0001 Reserved for inherently not codable concepts without codable children: Secondary | ICD-10-CM | POA: Insufficient documentation

## 2013-07-16 DIAGNOSIS — IMO0002 Reserved for concepts with insufficient information to code with codable children: Secondary | ICD-10-CM

## 2013-07-16 LAB — CBC AND DIFFERENTIAL
Basophils Absolute Automated: 0.01 10*3/uL (ref 0.00–0.20)
Basophils Automated: 0 %
Eosinophils Absolute Automated: 0.17 10*3/uL (ref 0.00–0.70)
Eosinophils Automated: 2 %
Hematocrit: 36.9 % — ABNORMAL LOW (ref 37.0–47.0)
Hgb: 12.8 g/dL (ref 12.0–16.0)
Immature Granulocytes Absolute: 0.01 10*3/uL
Immature Granulocytes: 0 %
Lymphocytes Absolute Automated: 2.16 10*3/uL (ref 0.50–4.40)
Lymphocytes Automated: 32 %
MCH: 27.2 pg — ABNORMAL LOW (ref 28.0–32.0)
MCHC: 34.7 g/dL (ref 32.0–36.0)
MCV: 78.5 fL — ABNORMAL LOW (ref 80.0–100.0)
MPV: 10.2 fL (ref 9.4–12.3)
Monocytes Absolute Automated: 0.6 10*3/uL (ref 0.00–1.20)
Monocytes: 9 %
Neutrophils Absolute: 3.9 10*3/uL (ref 1.80–8.10)
Neutrophils: 57 %
Nucleated RBC: 0 /100 WBC (ref 0–1)
Platelets: 303 10*3/uL (ref 140–400)
RBC: 4.7 10*6/uL (ref 4.20–5.40)
RDW: 14 % (ref 12–15)
WBC: 6.84 10*3/uL (ref 3.50–10.80)

## 2013-07-16 LAB — URINALYSIS, REFLEX TO MICROSCOPIC EXAM IF INDICATED
Bilirubin, UA: NEGATIVE
Blood, UA: NEGATIVE
Glucose, UA: >=500 — AB
Ketones UA: 5 — AB
Leukocyte Esterase, UA: NEGATIVE
Nitrite, UA: NEGATIVE
Protein, UR: NEGATIVE
Specific Gravity UA: 1.033 (ref 1.001–1.035)
Urine pH: 6 (ref 5.0–8.0)
Urobilinogen, UA: NEGATIVE mg/dL

## 2013-07-16 LAB — URINE HCG QUALITATIVE: Urine HCG Qualitative: NEGATIVE

## 2013-07-16 MED ORDER — HYDROMORPHONE HCL PF 1 MG/ML IJ SOLN
1.0000 mg | Freq: Once | INTRAMUSCULAR | Status: AC
Start: 2013-07-16 — End: 2013-07-16
  Administered 2013-07-16: 1 mg via INTRAVENOUS
  Filled 2013-07-16: qty 1

## 2013-07-16 MED ORDER — INSULIN REGULAR HUMAN 100 UNIT/ML IJ SOLN
8.0000 [IU] | Freq: Once | INTRAMUSCULAR | Status: AC
Start: 2013-07-16 — End: 2013-07-16
  Administered 2013-07-16: 8 [IU] via SUBCUTANEOUS
  Filled 2013-07-16: qty 24

## 2013-07-16 MED ORDER — ONDANSETRON HCL 4 MG/2ML IJ SOLN
4.0000 mg | Freq: Once | INTRAMUSCULAR | Status: AC
Start: 2013-07-16 — End: 2013-07-16
  Administered 2013-07-16: 4 mg via INTRAVENOUS
  Filled 2013-07-16: qty 2

## 2013-07-16 NOTE — ED Notes (Signed)
Pt talking loudly on cell phone, arguing with someone. Pt appears in NAD.

## 2013-07-16 NOTE — ED Notes (Signed)
Pt is having lower abdominal pain x2 days. Denies any nausea, vomiting, diarrhea. Pt blood sugar pta 408 for EMS

## 2013-07-16 NOTE — ED Provider Notes (Addendum)
Physician/Midlevel provider first contact with patient: 07/16/13 2230         History     Chief Complaint   Patient presents with   . Abdominal Pain     HPI Comments: Pain in lower abdomen aching discomfort LLQ>RLQ, no dysuria/urgency no back pain, no fever, no chest pain or shortness of breath, no dizziness, no vaginal bleeding or discharge.    Patient is a 38 y.o. female presenting with abdominal pain. The history is provided by the patient.   Abdominal Pain  The primary symptoms of the illness include abdominal pain and nausea. The primary symptoms of the illness do not include fever, fatigue, shortness of breath, vomiting, diarrhea, hematemesis, hematochezia or dysuria. The current episode started yesterday. The onset of the illness was sudden. The problem has been gradually worsening.   Symptoms associated with the illness do not include chills, anorexia, diaphoresis, heartburn, constipation, urgency, hematuria, frequency or back pain.       Past Medical History   Diagnosis Date   . Diabetes mellitus without complication    . Asthma without status asthmaticus    . Anemia    . Depression    . Anemia    . Panic attack        Past Surgical History   Procedure Date   . Cesarean section    . Cholecystectomy    . Tonsillectomy    . Hernia repair        Family History   Problem Relation Age of Onset   . Diabetes Mother    . Diabetes Sister    . Diabetes Maternal Aunt    . Hypertension Paternal Grandmother        Social  History   Substance Use Topics   . Smoking status: Former Games developer   . Smokeless tobacco: Not on file   . Alcohol Use: No       .     Allergies   Allergen Reactions   . Percocet (Oxycodone-Acetaminophen)      Unknown reaction     . Tape        Current/Home Medications    ALBUTEROL (PROAIR HFA) 108 (90 BASE) MCG/ACT INHALER    Inhale 2 puffs into the lungs every 4 (four) hours as needed for Wheezing.    GLIPIZIDE PO    Take by mouth.    LISINOPRIL (PRINIVIL,ZESTRIL) 20 MG TABLET    Take 20 mg by mouth  daily.    LISINOPRIL (PRINIVIL,ZESTRIL) 5 MG TABLET    Take 4 tablets (20 mg total) by mouth daily.    METFORMIN (GLUCOPHAGE) 500 MG TABLET    Take 2 tablets (1,000 mg total) by mouth 2 (two) times daily with meals.    SITAGLIPTIN-METFORMIN (JANUMET) 50-1000 MG PER TABLET    Take 1 tablet by mouth 2 (two) times daily with meals.        Review of Systems   Constitutional: Negative for fever, chills, diaphoresis and fatigue.   HENT: Negative for rhinorrhea and sore throat.    Respiratory: Negative for shortness of breath.    Cardiovascular: Negative for chest pain.   Gastrointestinal: Positive for nausea and abdominal pain. Negative for heartburn, vomiting, diarrhea, constipation, blood in stool, hematochezia, anorexia and hematemesis.   Genitourinary: Negative for dysuria, urgency, frequency, hematuria and difficulty urinating.   Musculoskeletal: Negative for back pain.   Skin: Negative for rash.   All other systems reviewed and are negative.  Physical Exam    BP: 144/95 mmHg, Heart Rate: 85 , Temp: 98.1 F (36.7 C), Resp Rate: 18 , SpO2: 98 %, Weight: 143.654 kg    Physical Exam   Nursing note and vitals reviewed.  Constitutional: She is oriented to person, place, and time. She appears well-developed and well-nourished. No distress.   HENT:   Head: Normocephalic and atraumatic.   Right Ear: External ear normal.   Left Ear: External ear normal.   Nose: Nose normal.   Mouth/Throat: Oropharynx is clear and moist.   Eyes: Conjunctivae normal and EOM are normal. Pupils are equal, round, and reactive to light.   Neck: Normal range of motion. Neck supple. No JVD present. No tracheal deviation present. No thyromegaly present.   Cardiovascular: Normal rate, regular rhythm, normal heart sounds and intact distal pulses.  Exam reveals no gallop and no friction rub.    No murmur heard.  Pulmonary/Chest: Effort normal and breath sounds normal. No stridor. No respiratory distress. She has no wheezes. She has no rales. She  exhibits no tenderness.   Abdominal: Soft. Bowel sounds are normal. She exhibits no distension and no mass. There is tenderness. There is no rebound and no guarding.        Tenderness LLQ and RLQ.   Musculoskeletal: Normal range of motion.   Lymphadenopathy:     She has no cervical adenopathy.   Neurological: She is alert and oriented to person, place, and time. She has normal reflexes. No cranial nerve deficit.   Skin: Skin is warm and dry. She is not diaphoretic.   Psychiatric: She has a normal mood and affect.       MDM and ED Course     ED Medication Orders      Start     Status Ordering Provider    07/16/13 2246   HYDROmorphone (DILAUDID) injection 1 mg   Once      Route: Intravenous  Ordered Dose: 1 mg         Last MAR action:  Given Wilburn Keir ALAN    07/16/13 2246   ondansetron (ZOFRAN) injection 4 mg   Once      Route: Intravenous  Ordered Dose: 4 mg         Last MAR action:  Given Milany Geck ALAN    07/16/13 2245   insulin regular (HumuLIN R,NovoLIN R) injection 8 Units   Once      Route: Subcutaneous  Ordered Dose: 8 Units         Last MAR action:  Given Mutasim Tuckey ALAN                 MDM  Number of Diagnoses or Management Options     Amount and/or Complexity of Data Reviewed  Clinical lab tests: ordered  Tests in the radiology section of CPT: ordered    Risk of Complications, Morbidity, and/or Mortality  Presenting problems: moderate  Diagnostic procedures: moderate  Management options: moderate    Patient Progress  Patient progress: stable        Procedures    Clinical Impression & Disposition     Clinical Impression  Final diagnoses:   Abdominal  pain, other specified site   Uncontrolled diabetes mellitus        ED Disposition     Discharge Dorothy [name] discharge to home/self care.    Condition at disposition: Stable             New Prescriptions  HYDROMORPHONE (DILAUDID) 2 MG TABLET    Take 1 tablet (2 mg total) by mouth every 3 (three) hours as needed for Pain (As  needed for pain).    PROMETHAZINE (PHENERGAN) 25 MG TABLET    Take 1 tablet (25 mg total) by mouth every 6 (six) hours as needed for Nausea.                 Joseph Berkshire, MD  07/16/13 2248    Labs Reviewed   CBC AND DIFFERENTIAL - Abnormal; Notable for the following:     Hematocrit 36.9 (*)     MCV 78.5 (*)     MCH 27.2 (*)     All other components within normal limits   COMPREHENSIVE METABOLIC PANEL - Abnormal; Notable for the following:     Glucose 399 (*)     Sodium 131 (*)     CO2 20 (*)     Globulin 4.0 (*)     All other components within normal limits   URINALYSIS, REFLEX TO MICROSCOPIC EXAM IF INDICATED - Abnormal; Notable for the following:     Glucose, UA >=500 (*)     Ketones UA 5 (*)     All other components within normal limits   URINE HCG QUALITATIVE   LIPASE   GFR       Ct nad        Joseph Berkshire, MD  07/17/13 [phone]

## 2013-07-17 LAB — COMPREHENSIVE METABOLIC PANEL
ALT: 17 U/L (ref 0–55)
AST (SGOT): 26 U/L (ref 5–34)
Albumin/Globulin Ratio: 0.9 (ref 0.9–2.2)
Albumin: 3.5 g/dL (ref 3.5–5.0)
Alkaline Phosphatase: 112 U/L (ref 40–150)
BUN: 11 mg/dL (ref 7–21)
Bilirubin, Total: 0.3 mg/dL (ref 0.2–1.2)
CO2: 20 mEq/L — ABNORMAL LOW (ref 22–29)
Calcium: 9.5 mg/dL (ref 8.5–10.5)
Chloride: 99 mEq/L (ref 98–107)
Creatinine: 0.8 mg/dL (ref 0.6–1.0)
Globulin: 4 g/dL — ABNORMAL HIGH (ref 2.0–3.6)
Glucose: 399 mg/dL — ABNORMAL HIGH (ref 70–100)
Potassium: 4.9 mEq/L (ref 3.5–5.1)
Protein, Total: 7.5 g/dL (ref 6.0–8.3)
Sodium: 131 mEq/L — ABNORMAL LOW (ref 136–145)

## 2013-07-17 LAB — GFR: EGFR: >60

## 2013-07-17 LAB — GLUCOSE WHOLE BLOOD - POCT: Whole Blood Glucose POCT: 350 mg/dL — ABNORMAL HIGH (ref 70–100)

## 2013-07-17 LAB — LIPASE: Lipase: 13 U/L (ref 8–78)

## 2013-07-17 MED ORDER — PROMETHAZINE HCL 25 MG PO TABS
25.0000 mg | ORAL_TABLET | Freq: Four times a day (QID) | ORAL | Status: DC | PRN
Start: 2013-07-17 — End: 2013-08-04

## 2013-07-17 MED ORDER — HYDROMORPHONE HCL 2 MG PO TABS
2.0000 mg | ORAL_TABLET | ORAL | Status: DC | PRN
Start: 2013-07-17 — End: 2013-08-04

## 2013-07-17 NOTE — Discharge Instructions (Signed)
Abdominal Pain    You have been diagnosed with abdominal (belly) pain. The cause of your pain is not yet known.    Many things can cause abdominal pain. Examples include viral infections and bowel (intestine) spasms. You might need another examination or more tests to find out why you have pain.    At this time, your pain does not seem to be caused by anything dangerous. You do not need surgery. You do not need to stay in the hospital.     Though we don't believe your condition is dangerous right now, it is important to be careful. Sometimes a problem that seems mild can become serious later. This is why it is very important that you return here or go to the nearest Emergency Department unless you are 100% improved.    YOU SHOULD SEEK MEDICAL ATTENTION IMMEDIATELY, EITHER HERE OR AT THE NEAREST EMERGENCY DEPARTMENT, IF ANY OF THE FOLLOWING OCCURS:   Your pain does not go away or gets worse.   You cannot keep fluids down or your vomit is dark green.    You vomit blood or see blood in your stool. Blood might be bright red or dark red. It can also be black and look like tar.   You have a fever or shaking chills.   Your skin or eyes look yellow or your urine looks brown.   You have severe diarrhea.

## 2013-07-19 ENCOUNTER — Telehealth: Payer: Self-pay

## 2013-07-19 NOTE — Telephone Encounter (Signed)
Transitional Care Management    HC spoke with pt and she stated that she was at work and it was not a good time to speak. Will call her again next week at a later time.    Number called: [phone]    [place]. [facility]  Program Health Coach  [facility]  [facility] [facility]  phone: [phone]

## 2013-07-24 ENCOUNTER — Telehealth: Payer: Self-pay

## 2013-07-24 NOTE — Telephone Encounter (Signed)
Transitional Care Management    HC attempted to reach pt and she was busy and stated that it was not a good time to speak. Will call her again at another time.    Tonny Branch. [facility]  Program Health Coach  [facility]  [facility]  phone: [phone]

## 2013-07-29 ENCOUNTER — Telehealth: Payer: Self-pay

## 2013-07-29 NOTE — Telephone Encounter (Signed)
Transitional Care Management    HC attempted to reach pt for initial call and there was no answer. Left VM message for her to callback. WIll be discharging her from the program and if she calls back will reopen the case.    Numbers called: [phone]        [phone]    Tonny Branch. [facility]  Program Health Coach  [facility]  [facility]  phone: [phone]

## 2013-08-03 ENCOUNTER — Inpatient Hospital Stay: Payer: Medicare Other | Admitting: Internal Medicine

## 2013-08-03 ENCOUNTER — Inpatient Hospital Stay
Admission: EM | Admit: 2013-08-03 | Discharge: 2013-08-03 | DRG: 759 | Disposition: A | Discharge status: Home / Self Care | Payer: Medicare (Managed Care) | Source: Ambulatory Visit | Attending: Obstetrics & Gynecology | Admitting: Obstetrics & Gynecology

## 2013-08-03 ENCOUNTER — Inpatient Hospital Stay
Admission: EM | Admit: 2013-08-03 | Discharge: 2013-08-06 | DRG: 759 | Disposition: A | Discharge status: Home / Self Care | Payer: Medicare (Managed Care)

## 2013-08-03 DIAGNOSIS — L0390 Cellulitis, unspecified: Secondary | ICD-10-CM | POA: Diagnosis present

## 2013-08-03 DIAGNOSIS — R599 Enlarged lymph nodes, unspecified: Secondary | ICD-10-CM | POA: Diagnosis present

## 2013-08-03 DIAGNOSIS — Z833 Family history of diabetes mellitus: Secondary | ICD-10-CM

## 2013-08-03 DIAGNOSIS — Z79899 Other long term (current) drug therapy: Secondary | ICD-10-CM

## 2013-08-03 DIAGNOSIS — N760 Acute vaginitis: Principal | ICD-10-CM | POA: Diagnosis present

## 2013-08-03 DIAGNOSIS — B3731 Acute candidiasis of vulva and vagina: Secondary | ICD-10-CM | POA: Diagnosis present

## 2013-08-03 DIAGNOSIS — J45909 Unspecified asthma, uncomplicated: Secondary | ICD-10-CM | POA: Diagnosis present

## 2013-08-03 DIAGNOSIS — N762 Acute vulvitis: Secondary | ICD-10-CM

## 2013-08-03 DIAGNOSIS — E119 Type 2 diabetes mellitus without complications: Secondary | ICD-10-CM | POA: Diagnosis present

## 2013-08-03 DIAGNOSIS — Z87891 Personal history of nicotine dependence: Secondary | ICD-10-CM

## 2013-08-03 DIAGNOSIS — Z885 Allergy status to narcotic agent status: Secondary | ICD-10-CM

## 2013-08-03 DIAGNOSIS — B3789 Other sites of candidiasis: Secondary | ICD-10-CM

## 2013-08-03 DIAGNOSIS — L270 Generalized skin eruption due to drugs and medicaments taken internally: Secondary | ICD-10-CM | POA: Diagnosis not present

## 2013-08-03 DIAGNOSIS — K439 Ventral hernia without obstruction or gangrene: Secondary | ICD-10-CM | POA: Diagnosis present

## 2013-08-03 DIAGNOSIS — F410 Panic disorder [episodic paroxysmal anxiety] without agoraphobia: Secondary | ICD-10-CM | POA: Diagnosis present

## 2013-08-03 DIAGNOSIS — F3289 Other specified depressive episodes: Secondary | ICD-10-CM | POA: Diagnosis present

## 2013-08-03 DIAGNOSIS — Z8249 Family history of ischemic heart disease and other diseases of the circulatory system: Secondary | ICD-10-CM

## 2013-08-03 DIAGNOSIS — T3695XA Adverse effect of unspecified systemic antibiotic, initial encounter: Secondary | ICD-10-CM | POA: Diagnosis not present

## 2013-08-03 DIAGNOSIS — B373 Candidiasis of vulva and vagina: Secondary | ICD-10-CM | POA: Diagnosis present

## 2013-08-03 MED ORDER — METRONIDAZOLE IN NACL 500 MG/100 ML IV SOLN
500.0000 mg | Freq: Three times a day (TID) | INTRAVENOUS | Status: DC
Start: 2013-08-03 — End: 2013-08-03

## 2013-08-03 MED ORDER — FLUCONAZOLE 100 MG PO TABS
150.0000 mg | ORAL_TABLET | ORAL | Status: DC
Start: 2013-08-03 — End: 2013-08-03
  Filled 2013-08-03 (×2): qty 2

## 2013-08-03 MED ORDER — SODIUM CHLORIDE 0.9 % IV MBP
3.0000 g | Freq: Once | INTRAVENOUS | Status: DC
Start: 2013-08-03 — End: 2013-08-03

## 2013-08-03 MED ORDER — SODIUM CHLORIDE 0.9 % IV MBP
1.5000 g | Freq: Four times a day (QID) | INTRAVENOUS | Status: DC
Start: 2013-08-04 — End: 2013-08-03

## 2013-08-03 NOTE — Progress Notes (Signed)
Pt is not pregnant per Dr Henrene Hawking. NO FHR on sono. Discharged to ER for possible tx of vulvar cellulitis.

## 2013-08-03 NOTE — H&P (Deleted)
38 year old female who presents by ambulance with vulvar swelling and irritation.  Patient states this discomfort has been going on for last 2 days.  Denies discharge.  Reports putting vasoline on vulva and powder.    Patient unsure if pregnant as she was told she is 5 months pregnant by ut had bleeding about one month ago and doesn't feel any movement.  Bedside sonogram shows no pregnancy.  Patient is a poor historian  PMH:  Asthma, diabetes, anemia  PSH:  Cholecystectomy, C/S x 4  Allergies:  Percocet  Meds:  metformin  ObHx:  As above    PE:  WD, WN female in mod distress  Vitals stable, afebrile  Abd:  Soft, obese, nt  Ext:  nt  Pelvic:  Extensive vulvar edema and erythema with cellulitis to thigh  Unable to perform pelvic exam secondary to pain from vulva    Labs:  Send CBC, chemistry panel, beta    A/P  Vulvar cellulitis  Will treat with Diflucan and Dicloxicillin and Gentamicin  Will consult ID in am.

## 2013-08-03 NOTE — ED Notes (Signed)
Pt arrives as cleared patient from L&D. Pt BIBA to L&D reporting she is 5 months pregnant. Dr. Henrene Hawking confirmed that patient is not pregnant, pt sent to ED for further evaluation. Pt reports vaginal pain since last week of February and vaginal swelling for past 2 days. Pt states she had a sono last month that confirmed a pregnancy.
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
## 2013-08-04 ENCOUNTER — Emergency Department: Payer: Medicare (Managed Care)

## 2013-08-04 DIAGNOSIS — N762 Acute vulvitis: Secondary | ICD-10-CM | POA: Diagnosis present

## 2013-08-04 LAB — CBC AND DIFFERENTIAL
Absolute NRBC: 0 10*3/uL
Basophils Absolute Automated: 0.02 10*3/uL (ref 0.00–0.20)
Basophils Automated: 0 %
Eosinophils Absolute Automated: 0.14 10*3/uL (ref 0.00–0.70)
Eosinophils Automated: 2 %
Hematocrit: 34.4 % — ABNORMAL LOW (ref 37.0–47.0)
Hgb: 12 g/dL (ref 12.0–16.0)
Immature Granulocytes Absolute: 0.02 10*3/uL
Immature Granulocytes: 0 %
Lymphocytes Absolute Automated: 1.79 10*3/uL (ref 0.50–4.40)
Lymphocytes Automated: 20 %
MCH: 27.3 pg — ABNORMAL LOW (ref 28.0–32.0)
MCHC: 34.9 g/dL (ref 32.0–36.0)
MCV: 78.2 fL — ABNORMAL LOW (ref 80.0–100.0)
MPV: 9.9 fL (ref 9.4–12.3)
Monocytes Absolute Automated: 0.69 10*3/uL (ref 0.00–1.20)
Monocytes: 8 %
Neutrophils Absolute: 6.25 10*3/uL (ref 1.80–8.10)
Neutrophils: 70 %
Nucleated RBC: 0 /100 WBC (ref 0–1)
Platelets: 346 10*3/uL (ref 140–400)
RBC: 4.4 10*6/uL (ref 4.20–5.40)
RDW: 14 % (ref 12–15)
WBC: 8.89 10*3/uL (ref 3.50–10.80)

## 2013-08-04 LAB — COMPREHENSIVE METABOLIC PANEL
ALT: 11 U/L (ref 0–55)
AST (SGOT): 15 U/L (ref 5–34)
Albumin/Globulin Ratio: 0.9 (ref 0.9–2.2)
Albumin: 3.2 g/dL — ABNORMAL LOW (ref 3.5–5.0)
Alkaline Phosphatase: 113 U/L (ref 40–150)
Anion Gap: 14 (ref 5.0–15.0)
BUN: 5 mg/dL — ABNORMAL LOW (ref 7.0–19.0)
Bilirubin, Total: 0.3 mg/dL (ref 0.2–1.2)
CO2: 21 mEq/L — ABNORMAL LOW (ref 22–29)
Calcium: 9.5 mg/dL (ref 8.5–10.5)
Chloride: 99 mEq/L (ref 98–107)
Creatinine: 0.8 mg/dL (ref 0.6–1.0)
Globulin: 3.7 g/dL — ABNORMAL HIGH (ref 2.0–3.6)
Glucose: 344 mg/dL — ABNORMAL HIGH (ref 70–100)
Potassium: 4 mEq/L (ref 3.5–5.1)
Protein, Total: 6.9 g/dL (ref 6.0–8.3)
Sodium: 134 mEq/L — ABNORMAL LOW (ref 136–145)

## 2013-08-04 LAB — POCT URINALYSIS AUTOMATED (IAH)
Bilirubin, UA POCT: NEGATIVE
Glucose, UA POCT: 500 — AB
Ketones, UA POCT: 80 mg/dL — AB
Nitrite, UA POCT: NEGATIVE
PH, UA POCT: 6 (ref 4.6–8)
Protein, UA POCT: NEGATIVE mg/dL
Specific Gravity, UA POCT: 1.015 mg/dL (ref 1.001–1.035)
Urobilinogen, UA POCT: 0.2 mg/dL

## 2013-08-04 LAB — HEMOLYSIS INDEX: Hemolysis Index: 17 (ref 0–18)

## 2013-08-04 LAB — GLUCOSE WHOLE BLOOD - POCT
Whole Blood Glucose POCT: 232 mg/dL — ABNORMAL HIGH (ref 70–100)
Whole Blood Glucose POCT: 310 mg/dL — ABNORMAL HIGH (ref 70–100)
Whole Blood Glucose POCT: 312 mg/dL — ABNORMAL HIGH (ref 70–100)
Whole Blood Glucose POCT: 282 mg/dL — ABNORMAL HIGH (ref 70–100)
Whole Blood Glucose POCT: 326 mg/dL — ABNORMAL HIGH (ref 70–100)

## 2013-08-04 LAB — GFR: EGFR: >60

## 2013-08-04 LAB — HCG QUANTITATIVE: hCG, Quant.: <1.2

## 2013-08-04 MED ORDER — SODIUM CHLORIDE 0.9 % IV MBP
2.0000 g | Freq: Three times a day (TID) | INTRAVENOUS | Status: DC
Start: 2013-08-04 — End: 2013-08-04
  Filled 2013-08-04 (×2): qty 2

## 2013-08-04 MED ORDER — VANCOMYCIN HCL 1000 MG IV SOLR
1000.0000 mg | Freq: Once | INTRAVENOUS | Status: AC
Start: 2013-08-04 — End: 2013-08-04
  Administered 2013-08-04: 1000 mg via INTRAVENOUS

## 2013-08-04 MED ORDER — VANCOMYCIN HCL 1000 MG IV SOLR
1000.0000 mg | Freq: Two times a day (BID) | INTRAVENOUS | Status: DC
Start: 2013-08-04 — End: 2013-08-04

## 2013-08-04 MED ORDER — SODIUM CHLORIDE 0.9 % IV MBP
2.0000 g | Freq: Once | INTRAVENOUS | Status: AC
Start: 2013-08-04 — End: 2013-08-04
  Administered 2013-08-04: 2 g via INTRAVENOUS
  Filled 2013-08-04: qty 2
  Filled 2013-08-04: qty 100

## 2013-08-04 MED ORDER — FLUCONAZOLE 100 MG PO TABS
150.0000 mg | ORAL_TABLET | Freq: Every evening | ORAL | Status: DC
Start: 2013-08-04 — End: 2013-08-06
  Administered 2013-08-04 – 2013-08-05 (×2): 150 mg via ORAL
  Filled 2013-08-04 (×2): qty 2

## 2013-08-04 MED ORDER — ONDANSETRON HCL 4 MG/2ML IJ SOLN
4.0000 mg | Freq: Three times a day (TID) | INTRAMUSCULAR | Status: AC | PRN
Start: 2013-08-04 — End: 2013-08-05

## 2013-08-04 MED ORDER — MORPHINE SULFATE 2 MG/ML IJ/IV SOLN (WRAP)
4.0000 mg | Freq: Once | Status: AC
Start: 2013-08-04 — End: 2013-08-04
  Administered 2013-08-04: 4 mg via INTRAVENOUS
  Filled 2013-08-04: qty 2

## 2013-08-04 MED ORDER — SODIUM CHLORIDE 0.9 % IV MBP
4.5000 g | Freq: Three times a day (TID) | INTRAVENOUS | Status: DC
Start: 2013-08-04 — End: 2013-08-06
  Administered 2013-08-04 – 2013-08-06 (×6): 4.5 g via INTRAVENOUS
  Filled 2013-08-04 (×8): qty 20

## 2013-08-04 MED ORDER — CEFOTAXIME SODIUM 1 G IJ SOLR
1.0000 g | Freq: Once | INTRAMUSCULAR | Status: DC
Start: 2013-08-04 — End: 2013-08-04

## 2013-08-04 MED ORDER — ALBUTEROL SULFATE HFA 108 (90 BASE) MCG/ACT IN AERS
2.0000 | INHALATION_SPRAY | Freq: Two times a day (BID) | RESPIRATORY_TRACT | Status: DC | PRN
Start: 2013-08-04 — End: 2013-08-06
  Filled 2013-08-04: qty 1

## 2013-08-04 MED ORDER — VANCOMYCIN HCL 1000 MG IV SOLR
1000.0000 mg | Freq: Once | INTRAVENOUS | Status: AC
Start: 2013-08-04 — End: 2013-08-04
  Administered 2013-08-04: 1000 mg via INTRAVENOUS
  Filled 2013-08-04: qty 1000
  Filled 2013-08-04: qty 250

## 2013-08-04 MED ORDER — FLUCONAZOLE 100 MG PO TABS
150.0000 mg | ORAL_TABLET | Freq: Once | ORAL | Status: AC
Start: 2013-08-04 — End: 2013-08-04
  Administered 2013-08-04: 150 mg via ORAL

## 2013-08-04 MED ORDER — VANCOMYCIN HCL 1000 MG IV SOLR
2000.0000 mg | Freq: Two times a day (BID) | INTRAVENOUS | Status: DC
Start: 2013-08-04 — End: 2013-08-05
  Administered 2013-08-04 – 2013-08-05 (×3): 2000 mg via INTRAVENOUS
  Filled 2013-08-04 (×4): qty 2000

## 2013-08-04 MED ORDER — GLUCAGON HCL (RDNA) 1 MG IJ SOLR
1.0000 mg | INTRAMUSCULAR | Status: DC | PRN
Start: 2013-08-04 — End: 2013-08-06

## 2013-08-04 MED ORDER — DEXTROSE 50 % IV SOLN
25.0000 mL | INTRAVENOUS | Status: DC | PRN
Start: 2013-08-04 — End: 2013-08-06

## 2013-08-04 MED ORDER — CLINDAMYCIN PHOSPHATE IN D5W 900 MG/50ML IV SOLN
900.0000 mg | Freq: Once | INTRAVENOUS | Status: DC
Start: 2013-08-04 — End: 2013-08-04

## 2013-08-04 MED ORDER — SODIUM CHLORIDE 0.9 % IV BOLUS
1000.0000 mL | Freq: Once | INTRAVENOUS | Status: AC
Start: 2013-08-04 — End: 2013-08-04
  Administered 2013-08-04: 1000 mL via INTRAVENOUS

## 2013-08-04 MED ORDER — LISINOPRIL 20 MG PO TABS
20.0000 mg | ORAL_TABLET | Freq: Every day | ORAL | Status: DC
Start: 2013-08-04 — End: 2013-08-06
  Administered 2013-08-04 – 2013-08-06 (×3): 20 mg via ORAL
  Filled 2013-08-04 (×3): qty 1

## 2013-08-04 MED ORDER — INSULIN ASPART 100 UNIT/ML SC SOLN
8.0000 [IU] | Freq: Once | SUBCUTANEOUS | Status: AC
Start: 2013-08-04 — End: 2013-08-04
  Administered 2013-08-04: 8 [IU] via SUBCUTANEOUS
  Filled 2013-08-04: qty 80

## 2013-08-04 MED ORDER — ONDANSETRON 4 MG PO TBDP
4.0000 mg | ORAL_TABLET | Freq: Once | ORAL | Status: AC
Start: 2013-08-04 — End: 2013-08-04
  Administered 2013-08-04: 4 mg via ORAL
  Filled 2013-08-04: qty 1

## 2013-08-04 MED ORDER — FLUCONAZOLE IN SODIUM CHLORIDE 400-0.9 MG/200ML-% IV SOLN
400.0000 mg | INTRAVENOUS | Status: DC
Start: 2013-08-04 — End: 2013-08-06
  Administered 2013-08-04 – 2013-08-06 (×3): 400 mg via INTRAVENOUS
  Filled 2013-08-04 (×3): qty 200

## 2013-08-04 MED ORDER — MORPHINE SULFATE 2 MG/ML IJ/IV SOLN (WRAP)
4.0000 mg | Status: AC | PRN
Start: 2013-08-04 — End: 2013-08-05
  Administered 2013-08-04 (×4): 4 mg via INTRAVENOUS
  Filled 2013-08-04 (×4): qty 2

## 2013-08-04 MED ORDER — METFORMIN HCL 500 MG PO TABS
1000.0000 mg | ORAL_TABLET | Freq: Two times a day (BID) | ORAL | Status: DC
Start: 2013-08-04 — End: 2013-08-06
  Administered 2013-08-06 (×2): 1000 mg via ORAL
  Filled 2013-08-04 (×3): qty 2

## 2013-08-04 MED ORDER — SODIUM CHLORIDE 0.9 % IV SOLN
100.0000 mL/h | INTRAVENOUS | Status: AC
Start: 2013-08-04 — End: 2013-08-05
  Administered 2013-08-04 (×2): 100 mL/h via INTRAVENOUS

## 2013-08-04 MED ORDER — GLUCOSE 40 % PO GEL
15.0000 g | ORAL | Status: DC | PRN
Start: 2013-08-04 — End: 2013-08-06

## 2013-08-04 MED ORDER — INSULIN ASPART 100 UNIT/ML SC SOLN
1.0000 [IU] | Freq: Three times a day (TID) | SUBCUTANEOUS | Status: DC | PRN
Start: 2013-08-04 — End: 2013-08-06
  Administered 2013-08-04 – 2013-08-05 (×3): 4 [IU] via SUBCUTANEOUS
  Administered 2013-08-05: 5 [IU] via SUBCUTANEOUS
  Administered 2013-08-05: 3 [IU] via SUBCUTANEOUS
  Administered 2013-08-05 – 2013-08-06 (×2): 2 [IU] via SUBCUTANEOUS
  Administered 2013-08-06 (×2): 3 [IU] via SUBCUTANEOUS
  Filled 2013-08-04: qty 40
  Filled 2013-08-04: qty 50
  Filled 2013-08-04: qty 30
  Filled 2013-08-04: qty 20
  Filled 2013-08-04: qty 30
  Filled 2013-08-04: qty 20
  Filled 2013-08-04: qty 40
  Filled 2013-08-04: qty 30
  Filled 2013-08-04: qty 40
  Filled 2013-08-04: qty 30

## 2013-08-04 MED ORDER — IOHEXOL 350 MG/ML IV SOLN
100.0000 mL | Freq: Once | INTRAVENOUS | Status: AC | PRN
Start: 2013-08-04 — End: 2013-08-04
  Administered 2013-08-04: 100 mL via INTRAVENOUS

## 2013-08-04 NOTE — ED Provider Notes (Signed)
Physician/Midlevel provider first contact with patient: 08/04/13 1610         [facility] EMERGENCY DEPARTMENT   HISTORY AND PHYSICAL EXAM     Physician/Midlevel provider first contact with patient: 08/04/13 9604       Date Time: 08/04/2013 7:51 AM  Patient Name: Dorothy [name],Dorothy [name] Y  Attending Physician: Marquette Old, MD  Mid-Level: Timmothy Sours    History of Presenting Illness:   Dorothy [name] is a 38 y.o. female     Chief Complaint: vulvar pain, redness, swelling  History obtained from: Patient .  Onset/Duration: 1 month, worse in 2 days  Quality: burning  Severity: severe  Aggravating Factors: pressure  Alleviating Factors: none  Associated Symptoms: none   Narrative/Additional Historical Findings:37 y.o. female diabetic with month h/o vulvar irritation worsening in past 2 days to severe redness, pain and swelling of vulva and inner thighs.  Denies f/c, n/v/d, abd pain, HA, CP, SOB.  Pt does not remember all her DM meds but states she has been compliant.     PCP: Mariana Single, MD    Past Medical History:     Past Medical History   Diagnosis Date   . Diabetes mellitus without complication    . Asthma without status asthmaticus    . Anemia    . Depression    . Anemia    . Panic attack        Past Surgical History:     Past Surgical History   Procedure Date   . Cesarean section    . Cholecystectomy    . Tonsillectomy    . Hernia repair        Family History:     Family History   Problem Relation Age of Onset   . Diabetes Mother    . Diabetes Sister    . Diabetes Maternal Aunt    . Hypertension Paternal Grandmother        Social History:     History     Social History   . Marital Status: Significant Other     Spouse Name: N/A     Number of Children: N/A   . Years of Education: N/A     Social History Main Topics   . Smoking status: Former Games developer   . Smokeless tobacco: Not on file   . Alcohol Use: No   . Drug Use: No   . Sexually Active: Not on file     Other Topics Concern   . Not on file      Social History Narrative   . No narrative on file       Allergies:     Allergies   Allergen Reactions   . Percocet (Oxycodone-Acetaminophen)      Unknown reaction     . Tape        Medications:     Patient's Medications   New Prescriptions    No medications on file   Previous Medications    ALBUTEROL (PROAIR HFA) 108 (90 BASE) MCG/ACT INHALER    Inhale 2 puffs into the lungs every 4 (four) hours as needed for Wheezing.    GLIPIZIDE PO    Take by mouth.    HYDROMORPHONE (DILAUDID) 2 MG TABLET    Take 1 tablet (2 mg total) by mouth every 3 (three) hours as needed for Pain (As needed for pain).    LISINOPRIL (PRINIVIL,ZESTRIL) 20 MG TABLET    Take 20 mg by mouth daily.  METFORMIN (GLUCOPHAGE) 500 MG TABLET    Take 2 tablets (1,000 mg total) by mouth 2 (two) times daily with meals.    PROMETHAZINE (PHENERGAN) 25 MG TABLET    Take 1 tablet (25 mg total) by mouth every 6 (six) hours as needed for Nausea.    SITAGLIPTIN-METFORMIN (JANUMET) 50-1000 MG PER TABLET    Take 1 tablet by mouth 2 (two) times daily with meals.   Modified Medications    No medications on file   Discontinued Medications    No medications on file       Review of Systems:     Review of Systems   Constitutional: Negative for fever and chills.   Eyes: Negative for blurred vision and pain.   Respiratory: Negative for cough.    Cardiovascular: Negative for chest pain.   Gastrointestinal: Negative for nausea, vomiting, abdominal pain, diarrhea and constipation.   Genitourinary: Negative for dysuria.   Musculoskeletal: Negative for back pain, myalgias and neck pain.   Skin:        Vulvar pain, burning, redness, swelling   Neurological: Negative for dizziness and headaches.         Physical Exam:     Filed Vitals:    08/04/13 0243 08/04/13 0350 08/04/13 0500 08/04/13 0647   BP: 113/55 141/59  126/61   Pulse: 86 88  87   Temp: 98 F (36.7 C) 98 F (36.7 C)  97.7 F (36.5 C)   TempSrc: Oral Oral     Resp: 20 18  18    Height:  1.829 m 1.803 m    Weight:   140.161 kg     SpO2: 99% 95%  96%     Pulse Oximetry Analysis - Normal 99% on RA    Physical Exam   Nursing note and vitals reviewed.  Constitutional: She is well-developed, well-nourished, and in no distress. No distress.   HENT:   Head: Atraumatic.   Eyes: Conjunctivae normal are normal. Right eye exhibits no discharge. Left eye exhibits no discharge.   Neck: Normal range of motion. Neck supple.   Cardiovascular: Normal rate and regular rhythm.    Pulmonary/Chest: Effort normal and breath sounds normal.   Abdominal: Soft. Bowel sounds are normal. She exhibits no distension. There is no tenderness.        obese   Genitourinary: Vulva exhibits erythema, rash and tenderness.        Extensive erythema, edema and tenderness over mons, vulva, inner thighs and buttocks.  White discharge over vulva.  No fluctuant areas palpated but exam limited due to pt discomfort.    Musculoskeletal: Normal range of motion. She exhibits no edema and no tenderness.   Skin: She is not diaphoretic.        See Gyn exam   Psychiatric: Affect normal.            Labs:     Results     Procedure Component Value Units Date/Time    Glucose Whole Blood - POCT [[id number]]  (Abnormal) Collected:08/04/13 0631     POCT - Glucose Whole blood 310 (H) mg/dL EXBMWUX:32/44/01 0272    Glucose Whole Blood - POCT [[id number]]  (Abnormal) Collected:08/04/13 0355     POCT - Glucose Whole blood 232 (H) mg/dL VQQVZDG:38/75/64 3329    Wet Prep Trichomonas [[id number]] Collected:08/04/13 0115    Specimen Information:Vaginal Swab Updated:08/04/13 0345    Narrative:    ORDER#: [id number]  ORDERED BY: CHUNG, SORA  SOURCE: Vaginal Swab                                 COLLECTED:  08/04/13 01:15  ANTIBIOTICS AT COLL.:                                RECEIVED :  08/04/13 03:41  Wet Prep Trichomonas                       FINAL       08/04/13 03:45   +  08/04/13   Positive for Yeast             No Trichomonas Seen             Reference Range:  No Trichomonas or Yeast Seen      UA POC (POCT UA Clinitek AX) [[id number]]  (Abnormal) Collected:08/04/13 0216     Color UA POCT Yellow Updated:08/04/13 0217     Clarity UA POCT Cloudy      Glucose, UA POCT =500 (A)      Bilirubin, UA POCT Negative      Ketones, UA POCT =80 (A) mg/dL      Specific Gravity, UA POCT 1.015 mg/dL      Blood, UA POCT  Small (A)      PH, UA POCT 6.0      Protein, UA POCT Negative mg/dL      Urobilinogen, UA POCT 0.2 mg/dL      Nitrite, UA POCT Negative      Leukocytes, UA POCT Trace  (A)           Rads:     Radiology Results (24 Hour)     Procedure Component Value Units Date/Time    CT Pelvis W IV Contrast Only [[id number]] Collected:08/04/13 0208    Order Status:Completed  Updated:08/04/13 0222    Narrative:    EXAM:    CT Pelvis Without Intravenous Contrast.    CLINICAL HISTORY:    38 years old, female; Vulvar erythema,  eval for abscess    TECHNIQUE:    Axial computed tomography images of the pelvis without intravenous contrast.    Coronal and sagittal reformatted images were created and reviewed.    EXAM DATE/TIME:    Exam ordered 08/04/2013 2:08 AM.    COMPARISON:    CT ABDOMEN PELVIS 07/16/2013 11:02:50 PM    FINDINGS:    Bowel:  Patient status post ventral herniorrhaphy with recurrent or residual   abdominal wall hernia extending to the left at the level of the herniorrhaphy   mesh containing unobstructed loops of bowel  No mucosal thickening.    Appendix:  No findings to suggest acute appendicitis.    Bladder:  Subcutaneous inflammation noted involving the suprapubic region   extending inferiorly into the region of the vulva/labia majora.  Findings are   compatible with a nonspecific cellulitis.  No definite abscess identified.    Reproductive:  See above.    Lymph nodes:  Prominent bilateral reactive inguinal lymph nodes noted   measuring up to 2.8 x 1.8 cm on the right.    Vasculature:  Unremarkable.  No distal aortic aneurysm.    Bones:  No acute fracture.      Impression:     IMPRESSION:  Subcutaneous inflammation noted involving the suprapubic region extending   inferiorly into the region of the vulva/labia majora.  Findings are compatible   with a nonspecific cellulitis.  No definite abscess identified.  Perineal soft   tissues appear unremarkable              Provider Notes:    Vulvar yeast with cellulitis in poorly controlled diabetic.  CT with no evidence of abscess.  Recommend admission for diflucan, IV antibiotics, pain management.     Plan discussed with Dr. Pollyann Glen who agrees with admission.     Discussed plan with pt who understands and agrees.     Assessment/Plan:     1. Vulvar cellulitis            CHART OWNERSHIP: I, Ok Edwards, PA-C, am the primary clinician of record.        Signed by: Ok Edwards, PA-C        [address] [place], [place]  08/04/13 [phone]

## 2013-08-04 NOTE — ED Provider Notes (Signed)
I have reviewed the notes, assessments, and/or procedures performed by Magda, I concur with her/his documentation of Dorothy [name].      Face to face note: nonpregnant overweight woman with DM here for vaginal pain with red/moist/irritated vulvar area, likely candida. Could be superimposed cellulitis so will admit for IV abx in context of hyperglycemia. Will check pelvis CT to eval for abscess, as pt could not tolerate much of exam    Marquette Old, MD  08/04/13 [phone]

## 2013-08-04 NOTE — Progress Notes (Signed)
Pharmacy [facility] Note: Vancomycin Dosing     Patient Active Problem List   Diagnosis   . Cellulitis   . Vulvar cellulitis       Patient weight: 140.161 kg (309 lb)   CREATININE: 0.8 (08/03/13 2315)  Estimated creatinine clearance - Cockcroft-Gault CrCl: 152.8 mL/min        Lab 08/03/13 2315   WBC 8.89   CREAT 0.8       Tmax: 97.7 F (36.5 C) (Oral)       Cultures (date/source): 4/12 Trichomonas swab - negative      Vancomycin Indication:Cellulitis  Vancomycin Target Trough Level: 15-20 mg/L     Recommendations:     Vancomycin Dosing:     1. Initial loading dose= 2000 mg  4/12 at 0242  2. Maintenance regimen  = 2000 mg Q 12hr   3. Vancomycin Trough ordered prior to 4th dose at 4/13 1400      Date 4/12 4/13        Dose 2000mg          Vanco level               Martin Majestic, PharmD

## 2013-08-04 NOTE — Treatment Plan (Signed)
VTE/PE Risk Screening  Complete Upon Admission and Transfer to Different Level of Care  Completed by nurse: Owens Shark 08/04/2013 8:08 AM   -----------------------------------------------------------------------------------------------------------  SECTION 1 - Risk Screening     []   Patient currently receiving anticoagulation therapy (Heparin, Lovenox, Coumadin, Pradaxa, Xarelto, Eliquis or Arixtra Only) and received 1 dose within 24 hours of admission STOP HERE   []   VTE/PE prophylaxis currently prescribed elsewhere - STOP HERE   []   Comfort Care - STOP HERE   []   Clinical Trials - STOP HERE    Contraindications: Patients with a history of the following conditions cannot haveSequential compression devices (SCD     []  Any of these conditions present , Call MD for pharmacological prophylaxis or ask MD to document reason for not having both mechanical and pharmacologic VTE prophylaxis   []  Post-op vein ligation   []  Suspected VTE   []  Cellulitis/Dermatitis of the leg   []  Severe ischemic Vascular disease   []  Edema related to Congestive Heart Faliure   []  Gangrene   []  Recent skin graft  -----------------------------------------------------------------------------------------------------------  SECTION 2 - Risk Factors (Check all that apply)    Moderate Risk Factors   []   Heart Failure (current or history of)   []   Respiratory Failure   []   Acute Myocardial Infarction (AMI)   []   Acute Infection   []   Rheumatologic Disorder   []   Elderly age (38 years old)   []   Ongoing hormonal treatment / estrogen use (including Tamoxifen, Raloxifene)   [x]   Obesity (BMI >/= 30kg/m2)    High Risk Factors   []   Recent (</= 1 month) trauma/surgery    Highest Risk Factors   []   Active Cancer   []   Previous VTE   []   Reduced mobility (>24 hrs; current or anticipated)   []   Known thrombophilic condition (hematological disorders that promote thrombosis)      []   No boxes checked in this section indicate patient is at low risk for VTE.  No VTE Prophylaxis indicated.       []   One or more risk factors present, enter an EPIC order for Sequential compression devices (SCD). Use per protocol, MD signature required.      Received patient from ED awake and alert times three oriented to room this 38 year old female admitted with cellulitis of vulva cleanse with normal saline left dry.  Medicated for generalized pain ordered IVF and antibiotic initiated. Pending culture.

## 2013-08-04 NOTE — Plan of Care (Signed)
Problem: Pain  Goal: Patient's pain/discomfort is manageable  Outcome: Progressing  Patients pain is managed with morphine IV as needed patient educated on side effects of medication     Problem: Moderate/High Fall Risk Score >/=15  Goal: Patient will remain free of falls  Outcome: Progressing  Patient has remained free from falls during shift, call bell within reach and patient reminded to call if assistance is needed.     Problem: Potential for Compromised Skin Integrity  Goal: Skin integrity is maintained or improved  Assess and monitor skin integrity. Identify patients at risk for skin breakdown on admission and per policy. Collaborate with interdisciplinary team and initiate plans and interventions as needed.   Outcome: Progressing  Patient admitted with periarea redness, edema, and pain.  Assist patient with peri care and to the bedside commode.  IV fluids and IV antibiotics given.  Patients blood glucose is monitored ACHS and sliding scale of insulin given as needed.

## 2013-08-04 NOTE — Treatment Plan (Addendum)
Infectious Diseases & Tropical  Medicine  Full Consult Dictated        Full consult dictated # [id number]    Assessment:    Vulvar cellulitis   Fungal rash on vulva and groin   Diabetes mellitus   Asthma    Plan:    OK to discharge from ID standpoint   Nystatin ointment   Augmentin X 7 days   Probiotics    Monitor clinically      Thanks for consultation        Ramia Sidney A. Janalyn Rouse, MD   08/04/2013

## 2013-08-04 NOTE — H&P (Signed)
ADMISSION HISTORY AND PHYSICAL EXAM    Date Time: 08/04/2013 8:26 AM  Patient Name: Dorothy [name]  Attending Physician: Neomia Dear*        History of Present Illness:   Dorothy [name] is a 38 [name] female with a H/O of DM who presents to the hospital with irritation and rash on her vulva. She has severe irritation, redness, pain and swelling of her vulva and her genitalia area. She denies any vaginal discharge. Denies dysuria, urgency or frequency.    Past Medical History:     Past Medical History   Diagnosis Date   . Diabetes mellitus without complication    . Asthma without status asthmaticus    . Anemia    . Depression    . Anemia    . Panic attack        Past Surgical History:     Past Surgical History   Procedure Date   . Cesarean section    . Cholecystectomy    . Tonsillectomy    . Hernia repair        Family History:     Family History   Problem Relation Age of Onset   . Diabetes Mother    . Diabetes Sister    . Diabetes Maternal Aunt    . Hypertension Paternal Grandmother        Social History:     History     Social History   . Marital Status: Significant Other     Spouse Name: N/A     Number of Children: N/A   . Years of Education: N/A     Social History Main Topics   . Smoking status: Former Games developer   . Smokeless tobacco: Not on file   . Alcohol Use: No   . Drug Use: No   . Sexually Active: Not on file     Other Topics Concern   . Not on file     Social History Narrative   . No narrative on file       Allergies:     Allergies   Allergen Reactions   . Percocet (Oxycodone-Acetaminophen)      Unknown reaction     . Tape        Medications:     Prescriptions prior to admission   Medication Sig   . albuterol (PROAIR HFA) 108 (90 BASE) MCG/ACT inhaler Inhale 2 puffs into the lungs every 4 (four) hours as needed for Wheezing.   Marland Kitchen lisinopril (PRINIVIL,ZESTRIL) 20 MG tablet Take 20 mg by mouth daily.   . metFORMIN (GLUCOPHAGE) 500 MG tablet Take 2 tablets (1,000 mg total) by mouth 2 (two)  times daily with meals.   . [DISCONTINUED] GLIPIZIDE PO Take by mouth.   . [DISCONTINUED] HYDROmorphone (DILAUDID) 2 MG tablet Take 1 tablet (2 mg total) by mouth every 3 (three) hours as needed for Pain (As needed for pain).   . [DISCONTINUED] promethazine (PHENERGAN) 25 MG tablet Take 1 tablet (25 mg total) by mouth every 6 (six) hours as needed for Nausea.   . [DISCONTINUED] sitaGLIPtin-metFORMIN (JANUMET) 50-1000 MG per tablet Take 1 tablet by mouth 2 (two) times daily with meals.       Review of Systems:   Constitutional: Negative for fever and chills.   Eyes: Negative for blurred vision and pain.   Respiratory: Negative for cough.   Cardiovascular: Negative for chest pain.   Gastrointestinal: Negative for nausea, vomiting, abdominal pain, diarrhea and constipation.  Genitourinary: Negative for dysuria.   Musculoskeletal: Negative for back pain, myalgias and neck pain.   Neurological: Negative for dizziness and headaches.      Physical Exam:     Filed Vitals:    08/04/13 0647   BP: 126/61   Pulse: 87   Temp: 97.7 F (36.5 C)   Resp: 18   SpO2: 96%       General appearance - alert, obese appearing, and in no distress, normal appearing weight  Mental status - alert, oriented to person, place, and time  Chest - clear to auscultation, no wheezes, rales or rhonchi, symmetric air entry  Heart - normal rate, regular rhythm, normal S1, S2, no murmurs, rubs, clicks or gallops  Abdomen - soft, nontender, nondistended, no masses or organomegaly  Genitalia: Has swollen, red, tender vulva and genitalia area.  Back exam - full range of motion, no tenderness, palpable spasm or pain on motion  Neurological - alert, oriented, normal speech, no focal findings or movement disorder noted  Musculoskeletal - no joint tenderness, deformity or swelling  Extremities - peripheral pulses normal, no pedal edema, no clubbing or cyanosis    Labs:     Results     Procedure Component Value Units Date/Time    Glucose Whole Blood - POCT  [[id number]]  (Abnormal) Collected:08/04/13 0631     POCT - Glucose Whole blood 310 (H) mg/dL ONGEXBM:84/13/24 4010    Glucose Whole Blood - POCT [[id number]]  (Abnormal) Collected:08/04/13 0355     POCT - Glucose Whole blood 232 (H) mg/dL IHKVQQV:95/63/87 5643    Wet Prep Trichomonas [[id number]] Collected:08/04/13 0115    Specimen Information:Vaginal Swab Updated:08/04/13 0345    Narrative:    ORDER#: [id number]                                    ORDERED BY: CHUNG, SORA  SOURCE: Vaginal Swab                                 COLLECTED:  08/04/13 01:15  ANTIBIOTICS AT COLL.:                                RECEIVED :  08/04/13 03:41  Wet Prep Trichomonas                       FINAL       08/04/13 03:45   +  08/04/13   Positive for Yeast             No Trichomonas Seen             Reference Range: No Trichomonas or Yeast Seen      UA POC (POCT UA Clinitek AX) [[id number]]  (Abnormal) Collected:08/04/13 0216     Color UA POCT Yellow Updated:08/04/13 0217     Clarity UA POCT Cloudy      Glucose, UA POCT =500 (A)      Bilirubin, UA POCT Negative      Ketones, UA POCT =80 (A) mg/dL      Specific Gravity, UA POCT 1.015 mg/dL      Blood, UA POCT  Small (A)      PH, UA POCT 6.0      Protein, UA POCT Negative mg/dL  Urobilinogen, UA POCT 0.2 mg/dL      Nitrite, UA POCT Negative      Leukocytes, UA POCT Trace  (A)             Rads:   Radiological Procedure reviewed.    CT Pelvis W IV Contras      IMPRESSION:     1. Mild stranding within the subcutaneous soft tissues of the on mons   pubis and vulva without evidence of an abscess. The findings likely   represent cellulitis. Mild bilateral inguinal lymphadenopathy.   2. Large ventral hernia containing nonobstructed loops of large and   small bowel.    Assessment :   Vulvar cellulitis   Vaginal candidiasis   Diabetes mellitus   Asthma      Plan  :      Patient started on vancomycin and zosyn.   Start on Fluconazole IV.   DM control.   Pain control.   ID  consult.        Sonny Dandy, MD  08/04/2013  .8:26 AM

## 2013-08-05 DIAGNOSIS — N760 Acute vaginitis: Principal | ICD-10-CM

## 2013-08-05 LAB — GLUCOSE WHOLE BLOOD - POCT
Whole Blood Glucose POCT: 280 mg/dL — ABNORMAL HIGH (ref 70–100)
Whole Blood Glucose POCT: 351 mg/dL — ABNORMAL HIGH (ref 70–100)
Whole Blood Glucose POCT: 304 mg/dL — ABNORMAL HIGH (ref 70–100)
Whole Blood Glucose POCT: 286 mg/dL — ABNORMAL HIGH (ref 70–100)

## 2013-08-05 LAB — VANCOMYCIN, TROUGH: Vancomycin Trough: 8.5 ug/mL — ABNORMAL LOW (ref 10.0–20.0)

## 2013-08-05 MED ORDER — DIPHENHYDRAMINE HCL 50 MG/ML IJ SOLN
6.2500 mg | Freq: Four times a day (QID) | INTRAMUSCULAR | Status: DC | PRN
Start: 2013-08-05 — End: 2013-08-06
  Administered 2013-08-05 – 2013-08-06 (×4): 6.5 mg via INTRAVENOUS
  Filled 2013-08-05 (×4): qty 1

## 2013-08-05 MED ORDER — VANCOMYCIN HCL 1000 MG IV SOLR
2250.0000 mg | Freq: Two times a day (BID) | INTRAVENOUS | Status: DC
Start: 2013-08-06 — End: 2013-08-06
  Administered 2013-08-06: 2250 mg via INTRAVENOUS
  Filled 2013-08-05 (×2): qty 2250

## 2013-08-05 MED ORDER — MORPHINE SULFATE 2 MG/ML IJ/IV SOLN (WRAP)
4.0000 mg | Status: DC | PRN
Start: 2013-08-05 — End: 2013-08-06
  Administered 2013-08-05 – 2013-08-06 (×4): 4 mg via INTRAVENOUS
  Administered 2013-08-06: 2 mg via INTRAVENOUS
  Administered 2013-08-06: 4 mg via INTRAVENOUS
  Administered 2013-08-06: 2 mg via INTRAVENOUS
  Filled 2013-08-05 (×3): qty 2
  Filled 2013-08-05: qty 1
  Filled 2013-08-05: qty 2
  Filled 2013-08-05: qty 1
  Filled 2013-08-05: qty 2

## 2013-08-05 NOTE — Consults (Signed)
Service Date: 08/05/2013     Patient Type: I     CONSULTING PHYSICIAN: Necia Kamm MD     REFERRING PHYSICIAN: Getachew Y Woldeher MD     HISTORY OF PRESENT ILLNESS:  [name] is a 37-year-old African American female with a history of  diabetes mellitus, asthma, and is admitted because of rash and irritation  over her vulva causing pain.  She also complains of some discharge.  There  are no urinary symptoms, no skin rashes.  There are no joint pains or  swelling.  She denies having any fevers and chills, nausea, vomiting, or  diarrhea.  She has been on vancomycin and Zosyn, and has been getting  Diflucan as well, and is feeling much improved.     PAST MEDICAL HISTORY:  Consistent with diabetes mellitus, asthma, anemia, depression, and panic  attacks.     PAST SURGICAL HISTORY:  C-section, cholecystectomy, tonsillectomy, and hernia repair.     FAMILY HISTORY:  Significant for diabetes to her mother, sister, and maternal aunt and  hypertension to paternal grandmother.     SOCIAL HISTORY:  No smoking, no alcohol.     ALLERGIES:  PERCOCET.     REVIEW OF SYSTEMS:  The patient denies having any fevers or chills.  No seizures or syncopal  episodes.  No runny nose.  No sore throat.  No chest pain, cough, or  shortness of breath.  No abdominal pain, nausea, vomiting, and diarrhea.   She has pain in the vulvar area.  It is burning, has redness and dry scaly  rash.     PHYSICAL EXAMINATION:  GENERAL:  The patient is awake and alert, appears to be in no distress.  VITAL SIGNS:  Today, temperature is 97.3, blood pressure 122/69.  Pulse is  83, respiratory rate 18.  HEENT:  Head is normocephalic and atraumatic.  Pupils are reactive to  light.  Sclerae anicteric.  Oral mucosa is normal.  NECK:  Supple.  There is no neck lymphadenopathy and no thyromegaly.  LUNGS:  Clear to auscultation.  HEART:  Sounds are regular rate and rhythm without any murmur.  ABDOMEN:  Soft, nontender.  There is no organomegaly.  EXTREMITIES:  No  edema, no clubbing, no cyanosis and in the genital area  there is a significant amount of fungal dry rash, and there is surrounding  erythema.  There are no open wounds.  There is no pedal edema, clubbing, or  cyanosis.  NEUROLOGIC:  Grossly intact.     LABORATORY AND DIAGNOSTIC DATA:  Her white cell count is 8.8, hemoglobin 12.0, hematocrit 34.4, platelet  count 346,000.  Neutrophil count is 70%.  UA is unremarkable and wet  preparation is positive for yeast.       The CT scan consistent with cellulitis.     ASSESSMENT AND PLAN:  1.  Vulvar cellulitis.  2.  Fungal rash.  3.  Diabetes mellitus.  4.  Asthma.     PLAN:  At this time, okay to discharge from infectious disease point of view.   Apply Nystatin ointment twice daily for 7 days, Augmentin x7 days.   Continue patient on probiotics and monitor patient clinically.     Thank you for the consultation.           D:  08/05/2013 10:29 AM by Dr. Maisen Klingler, MD ([id number])  T:  08/05/2013 11:40 AM by       (Conf: [id number]) (Doc ID: [id number])

## 2013-08-05 NOTE — Plan of Care (Signed)
Problem: Safety  Goal: Patient will be free from injury during hospitalization  Outcome: Progressing  Nonskid footwear is on.  Belongings within reach.  Bed at lowest position.  Reinforced the use of the call bell system.  Hourly rounding performed to anticipate needs.      Problem: Pain  Goal: Patient's pain/discomfort is manageable  Outcome: Progressing  Patient complains of 8-10/10 groin pain, PRN 4mg  morphine q2h given with relief goal of 3/10.      Problem: Tissue integrity  Goal: Damaged tissue is healing and protected  Outcome: Progressing  Groin area is reddened; drying; and feels it is more swollen in the afternoon.  Encouraged patient to not use hospital wipes because she feels she is starting to break out and have rashes.  Notified MD of itchiness, PRN 6.5mg  benadryl q6h.      Problem: Infection/Potential for Infection  Goal: Free from infection  Outcome: Progressing  Patient is on IV antibiotics.  Temperatures remain stable.      Problem: Endocrine Abnormality  Goal: [facility]HS BLOOD GLUCOSE STABLE AT ESTABLISHED GOAL (FENE)  Outcome: Progressing  Blood sugars checked ac/hs; it has remained elevated above 250 throughout the day; sliding scale coverage given, but still holding the metformin due to recent CT scan with contrast.      Comments:   Husband at bedside.  Vancotrough level drawn.  Notified pharmacy of results.      1. Pain management:  Complains of groin pain, PRN 4mg  morphine q2h available.      2. Labs/test results (including glucose):  Elevated blood sugars.      3. DVT Prophylaxis:  Patient has been refusing SCDs.     4. Ambulation/Activity:  She is standby assist to the bedside commode and sometimes asks for a bedtime.      5. Plan of Care:  IV antibiotics  Pain mangement  Control blood sugars  Discharge soon    6. Discharge Planning (PT, OT, rehab, etc):  Patient is homeless but is independent.  She will need a cab voucher or metro token in order to leave the hospital and go to her destination  upon discharge.

## 2013-08-05 NOTE — Progress Notes (Signed)
INTERNAL MEDICINE PROGRESS NOTE    Progress Note - JAMIRIA LANGILL    Date Time: 08/05/2013 8:52 PM  Patient Name: 38 y.o. female with <principal problem not specified>      Assessment :   Vulvar cellulitis   Vaginal candidiasis   Diabetes mellitus   Asthma        Plan:     D/W ID  Continue topical anti fungal.  PO Augmentin.  Pain MGT.        Subjective:   Pts continue to have pain around her vulva.      Review of Systems:    ROS as per HPI    Physical Exam:     Filed Vitals:    08/05/13 2018   BP: 132/72   Pulse: 82   Temp: 97.7 F (36.5 C)   Resp: 16   SpO2: 100%       Intake/Output Summary (Last 24 hours) at 08/05/13 2052  Last data filed at 08/05/13 1610   Gross per 24 hour   Intake   2400 ml   Output      0 ml   Net   2400 ml       General appearance - alert, well appearing, and in no distress  Mental status - alert, oriented to person, place, and time    Chest - clear to auscultation, no wheezes, rales or rhonchi, symmetric air entry  Heart - normal rate, regular rhythm, normal S1, S2, no murmurs, rubs, clicks or gallops  Abdomen - soft, nontender, nondistended, no masses or organomegaly  Back exam - full range of motion, no tenderness, palpable spasm or pain on motion  Neurological - alert, oriented, normal speech, no focal findings or movement disorder noted  Musculoskeletal - no joint tenderness, deformity or swelling  Extremities - peripheral pulses normal, no pedal edema, no clubbing or cyanosis  Skin -Has swollen, red, tender vulva and genitalia area, it has improved.      Meds Reviewed: Yes  Medications:     Current Facility-Administered Medications   Medication Dose Route Frequency   . fluconazole  400 mg Intravenous Q24H SCH   . fluconazole  150 mg Oral QHS   . lisinopril  20 mg Oral Daily   . metFORMIN  1,000 mg Oral BID Meals   . piperacillin-tazobactam  4.5 g Intravenous Q8H   . [START ON 08/06/2013] vancomycin  2,250 mg Intravenous Q12H   . [DISCONTINUED] vancomycin  2,000 mg Intravenous  Q12H       PRN Medication:  albuterol, dextrose, dextrose, diphenhydrAMINE, glucagon (rDNA), insulin aspart, [EXPIRED] morphine, morphine, [EXPIRED] ondansetron      Labs:     Results     Procedure Component Value Units Date/Time    Glucose Whole Blood - POCT [[id number]]  (Abnormal) Collected:08/05/13 1636     POCT - Glucose Whole blood 304 (H) mg/dL [name]:29/56/21 3086    Vancomycin, trough [[id number]]  (Abnormal) Collected:08/05/13 1510    Specimen Information:Blood Updated:08/05/13 1556     Vancomycin Trough 8.5 (L) ug/mL      Vancomycin Time of Last Dose *      Vancomycin Date of Last Dose 08/05/2013     Glucose Whole Blood - POCT [[id number]]  (Abnormal) Collected:08/05/13 1154     POCT - Glucose Whole blood 351 (H) mg/dL WNUUVOZ:36/64/40 3474    Glucose Whole Blood - POCT [[id number]]  (Abnormal) Collected:08/05/13 0557     POCT - Glucose Whole blood 280 (H) mg/dL IEPPIRJ:18/84/16  0559    Glucose Whole Blood - POCT [[id number]]  (Abnormal) Collected:08/04/13 2133     POCT - Glucose Whole blood 326 (H) mg/dL WUJWJXB:14/78/29 5621            @MICROIP @    IRadiology:   Radiological Procedure reviewed.      Sonny Dandy, MD  08/05/2013  8:52 PM

## 2013-08-05 NOTE — Plan of Care (Signed)
Problem: Safety  Goal: Patient will be free from injury during hospitalization  Outcome: Progressing  Pt. Will remain free from fall during this hospitalization, assisted to the bedside commode and able to walk to the BR with minimal assist and supervision, fall precaution in place, will cont. To monitor.     Problem: Pain  Goal: Patient's pain/discomfort is manageable  Outcome: Progressing  Pt. C/O pain to the peri area, PRN morphine given, with relief.     Problem: Potential for Compromised Skin Integrity  Goal: Skin integrity is maintained or improved  Assess and monitor skin integrity. Identify patients at risk for skin breakdown on admission and per policy. Collaborate with interdisciplinary team and initiate plans and interventions as needed.   Outcome: Progressing  Pt. Admitted with peri area edema, redness, on IV ABx, peri care provided, will cont. To monitor.     Comments:   BS 326 mg/dl, coverage given as needed, snack provided, pt. Been monitoered for hypo/hyperglycemia, will cot. To monitor.

## 2013-08-05 NOTE — Consults (Addendum)
Nutritional Support Services  Nutrition Assessment    Dorothy [name] 38 y.o. female   MRN: [id number]    Summary of Nutrition Recommendations:    1) Continue consistent CHO diet    2) Obtain HA1C    3) Monitor/Tighten glucose control    4) RD educated pt on need for consistent CHO diet- reviewed symptoms of high/low blood sugars.  RD also gave pt 1800 kcal meal plan to help control blood sugars and weight    Pt with limited adherence to recommendations as pt reported living in a shelter up until this past week    -----------------------------------------------------------------------------------------------------------------                                                            Assessment Data:   Referral Source: RN Screen  Reason for Referral: Unintentional wt loss- pt denies    Nutrition: POCT: 280-344, no HA1C   Pt reports living in a shelter until this past week.  Pt unable to check blood sugars and states she eats what is available at the shelter.  Pt reports drinking mostly water  Events of Current Admission: Admitted for vulvar cellulitis,not pregnant as previously stated by pt  Medical Hx:  has a past medical history of Diabetes mellitus without complication; Asthma without status asthmaticus; Anemia; Depression; Anemia; and Panic attack.  Social/Diet Hx: Former smoker      Current Diet Order  Diet consistent carbohydrate    ANTHROPOMETRIC  Anthropometrics  Height: 180.3 cm (5\' 11" )  Weight: 140.161 kg (309 lb)  Weight Change: 0   IBW/kg (Calculated) Female: 78.21 kg  IBW/kg (Calculated) Female: 70.43 kg  BMI (calculated): 42     Physical Appearance:   Skin: cellulitis to vulva   GI function: +nausea    ESTIMATED NEEDS  1500-1800 kcal/day (10-13 kcal/kg)  ~105 gm pro/day (1.5 gm pro/IBW)    Pertinent Medications: Glucophage  IVF:       . [EXPIRED] sodium chloride 100 mL/hr (08/04/13 1816)       Pertinent labs:  Lab 08/03/13 2315   NA 134*   K 4.0   CL 99   CO2 21*   BUN 5.0*   CREAT 0.8   GLU 344*   CA  9.5   MG --   PHOS --   EGFR >60.0   WBC 8.89   HCT 34.4*   HGB 12.0   TRIG --   AMY --   LIP --                                                                 Nutrition Diagnosis      Altered nutrition related labs related to DM- living in shelter- not taking medication as evidenced by POCT > 180  Intervention     Goal: Obtain adequate nutrition, controlled blood sugars    1) Continue consistent CHO diet    2) Obtain HA1C    3) Monitor/Tighten glucose control    4) RD educated pt on need for consistent CHO diet- reviewed symptoms of high/low blood sugars.  RD also gave pt 1800 kcal meal plan to help control blood sugars and weight                                                             Monitoring/Evaluation     Monitor po intake, blood sugars, education needs regarding weight/DM    RD to follow pt at high-mod    Lenin Kuhnle L. Lenon Ahmadi, RD, LD  Spectralink x [phone]  Nutrition Office x [phone]

## 2013-08-06 LAB — GLUCOSE WHOLE BLOOD - POCT
Whole Blood Glucose POCT: 285 mg/dL — ABNORMAL HIGH (ref 70–100)
Whole Blood Glucose POCT: 243 mg/dL — ABNORMAL HIGH (ref 70–100)
Whole Blood Glucose POCT: 286 mg/dL — ABNORMAL HIGH (ref 70–100)

## 2013-08-06 MED ORDER — AMOXICILLIN-POT CLAVULANATE 875-125 MG PO TABS
1.0000 | ORAL_TABLET | Freq: Two times a day (BID) | ORAL | Status: DC
Start: 2013-08-06 — End: 2013-08-06

## 2013-08-06 MED ORDER — NYSTATIN 100000 UNIT/GM EX OINT
TOPICAL_OINTMENT | Freq: Two times a day (BID) | CUTANEOUS | Status: DC
Start: 2013-08-06 — End: 2013-08-06
  Filled 2013-08-06: qty 15

## 2013-08-06 MED ORDER — CLOTRIMAZOLE-BETAMETHASONE 1-0.05 % EX CREA
TOPICAL_CREAM | Freq: Two times a day (BID) | CUTANEOUS | Status: DC
Start: 2013-08-06 — End: 2015-03-30

## 2013-08-06 MED ORDER — NYSTATIN 100000 UNIT/GM EX CREA
TOPICAL_CREAM | Freq: Two times a day (BID) | CUTANEOUS | Status: DC
Start: 2013-08-06 — End: 2013-08-06

## 2013-08-06 MED ORDER — FLUCONAZOLE 100 MG PO TABS
100.0000 mg | ORAL_TABLET | Freq: Every day | ORAL | Status: AC
Start: 2013-08-06 — End: 2013-08-13

## 2013-08-06 MED ORDER — CLOTRIMAZOLE-BETAMETHASONE 1-0.05 % EX CREA
TOPICAL_CREAM | Freq: Two times a day (BID) | CUTANEOUS | Status: DC
Start: 2013-08-06 — End: 2013-08-06

## 2013-08-06 MED ORDER — FLUCONAZOLE IN SODIUM CHLORIDE 2 MG/ML (CUSTOM)
100.0000 mg | INTRAVENOUS | Status: DC
Start: 2013-08-07 — End: 2013-08-06

## 2013-08-06 MED ORDER — NYSTATIN 100000 UNIT/GM EX CREA
TOPICAL_CREAM | Freq: Two times a day (BID) | CUTANEOUS | Status: DC
Start: 2013-08-06 — End: 2015-03-30

## 2013-08-06 NOTE — Discharge Instructions (Signed)
Date Time: 08/06/2013 2:58 PM  Attending Physician: Neomia Dear*    Date of Admission:   08/03/2013    Reason for Admission:   Vulvar cellulitis [616.10]  Vulvar cellulitis    Follow up:        Neurology (If Stroke): ______________________  Phone:______________________    Other:___________________________________   Phone: ______________________    If you are taking Warfarin, please follow up with (health professional/clinic) ____________ on _____________to have your PT/INR blood test checked.          Medications:    Your medications have been listed for you on the Medication Reconciliation Discharge Home List. Please bring a copy of all discharge instructions, including your Medication Reconciliation Discharge Home List when you visit your physician.      Continue taking all medications even if you feel well, unless otherwise instructed by physician.    Do not take any over-the-counter medications or herbal supplements without checking with your pharmacist or doctor.     Activity:    Rise slowly from a sitting or lying position. Increase activity slowly, unless otherwise instructed by physician.    Perform exercises as desginated by Therapist and Physician.    In the event of severe shortness of breath or chest discomfort, call 911. Do NOT drive to the hospital.    Speak with your physician regarding specific driving and/or work restrictions.    Diet:    If you have special diet orders, you have been given printed diet instructions.   ________________________________________________________________________    Tobacco Cessation Counseling:  If you are currently a tobacco user or have used tobacco within the last 12 months, we have provided you with written Tobacco Cessation Counseling.  ________________________________________________________________________    Heart Failure Education:  If you have a diagnosis of a Heart Failure, we have provided you with written Heart Failure Education.     Weigh  yourself once a day at the same time. Record and bring the weight record to your next physician appointment.     Call your doctor if you gain more than 3 pounds in one day or 5 pounds in one week, or if you experience shortness of breath, leg swelling and/or chest discomfort.     Enroll in the [facility] Tel-Assurance Program, a heart failure patient support program. Call [phone] for additional details.   ________________________________________________________________________    Diamond Nickel Education:    Call 911 for:    Sudden numbness or weakness of the face    Sudden numbness of the arm or leg especially one side of the body    Sudden confusion, trouble speaking or understanding    Sudden trouble seeing in one or both eyes    Sudden trouble walking or dizziness, loss of balance or coordination        For promotion of your health, we have provided you with personalized written education on risk factors specific to your diagnosis, including but not limited to:    High Blood Pressure    High Cholesterol    Atrial Fibrillation    Overweight    Diabetes    Smoking         Vaccinations  Pneumonia Vaccine Received on:  Flu Vaccine Received on:             Treatments/Special Instructions:   ***               Signed by: Bunnie Pion, RN    I HAVE RECEIVED AND UNDERSTAND THESE DISCHARGE INSTRUCTIONS.

## 2013-08-06 NOTE — Plan of Care (Signed)
Problem: Safety   Goal: Patient will be free from injury during hospitalization   Outcome: Progressing   Nonskid footwear is on. Belongings within reach. Bed at lowest position. Reinforced the use of the call bell system. Hourly rounding performed to anticipate needs.   Problem: Pain   Goal: Patient's pain/discomfort is manageable   Outcome: Progressing   Morphine 4 mg iv given for groin area pain. Area swolen and red.  Receiving iv and po antibiotics. Pt verbalized pain relief.  Problem: Tissue integrity   Goal: Damaged tissue is healing and protected   Outcome: Progressing   Groin area is reddened; drying; and feels it is more swollen . Pt  C/o itching rash noted chest and arms. benadryl  6.5 mg iv given with verbalized relief.

## 2013-08-06 NOTE — Consults (Signed)
Service Date: 08/05/2013     Patient Type: I     CONSULTING PHYSICIAN: Nevada Crane MD     REFERRING PHYSICIAN: Iris Pert MD     HISTORY OF PRESENT ILLNESS:  Dorothy [name] is a 38 year old African American female with a history of  diabetes mellitus, asthma, and is admitted because of rash and irritation  over her vulva causing pain.  She also complains of some discharge.  There  are no urinary symptoms, no skin rashes.  There are no joint pains or  swelling.  She denies having any fevers and chills, nausea, vomiting, or  diarrhea.  She has been on vancomycin and Zosyn, and has been getting  Diflucan as well, and is feeling much improved.     PAST MEDICAL HISTORY:  Consistent with diabetes mellitus, asthma, anemia, depression, and panic  attacks.     PAST SURGICAL HISTORY:  C-section, cholecystectomy, tonsillectomy, and hernia repair.     FAMILY HISTORY:  Significant for diabetes to her mother, sister, and maternal aunt and  hypertension to paternal grandmother.     SOCIAL HISTORY:  No smoking, no alcohol.     ALLERGIES:  PERCOCET.     REVIEW OF SYSTEMS:  The patient denies having any fevers or chills.  No seizures or syncopal  episodes.  No runny nose.  No sore throat.  No chest pain, cough, or  shortness of breath.  No abdominal pain, nausea, vomiting, and diarrhea.   She has pain in the vulvar area.  It is burning, has redness and dry scaly  rash.     PHYSICAL EXAMINATION:  GENERAL:  The patient is awake and alert, appears to be in no distress.  VITAL SIGNS:  Today, temperature is 97.3, blood pressure 122/69.  Pulse is  83, respiratory rate 18.  HEENT:  Head is normocephalic and atraumatic.  Pupils are reactive to  light.  Sclerae anicteric.  Oral mucosa is normal.  NECK:  Supple.  There is no neck lymphadenopathy and no thyromegaly.  LUNGS:  Clear to auscultation.  HEART:  Sounds are regular rate and rhythm without any murmur.  ABDOMEN:  Soft, nontender.  There is no organomegaly.  EXTREMITIES:  No  edema, no clubbing, no cyanosis and in the genital area  there is a significant amount of fungal dry rash, and there is surrounding  erythema.  There are no open wounds.  There is no pedal edema, clubbing, or  cyanosis.  NEUROLOGIC:  Grossly intact.     LABORATORY AND DIAGNOSTIC DATA:  Her white cell count is 8.8, hemoglobin 12.0, hematocrit 34.4, platelet  count 346,000.  Neutrophil count is 70%.  UA is unremarkable and wet  preparation is positive for yeast.       The CT scan consistent with cellulitis.     ASSESSMENT AND PLAN:  1.  Vulvar cellulitis.  2.  Fungal rash.  3.  Diabetes mellitus.  4.  Asthma.     PLAN:  At this time, okay to discharge from infectious disease point of view.   Apply Nystatin ointment twice daily for 7 days, Augmentin x7 days.   Continue patient on probiotics and monitor patient clinically.     Thank you for the consultation.           D:  08/05/2013 10:29 AM by Dr. Nevada Crane, MD [phone])  T:  08/05/2013 11:40 AM by       Everlean Cherry: [id number]) (Doc ID: [id number])

## 2013-08-06 NOTE — Progress Notes (Signed)
Pt discharged to home per md order. Prescription for med given. Discharge instruction, follow up and medication reviewed with pt, verbalized understanding. Iv line taken out. Pt left unit 21 via wheelchair accompanied by husband and security

## 2013-08-06 NOTE — Progress Notes (Signed)
Infectious Diseases & Tropical Medicine  Progress Note    08/06/2013   Dorothy [name] [id number] is a 38 y.o. female,       Assessment:      Vulvar cellulitis improved   Fungal rash on vulva and groin improving   Diabetes mellitus   Asthma   Rash over the chest-possibility of an ALLERGIC reaction to antibiotics.   Generalized weakness    Plan:      Discontinue antibiotics.   Continue nystatin ointment over the vulvar area.   Antihistamines.   Continue probiotics   Discussed with patient in detail   Discussed with Dr. Pollyann Glen    ROS:      General:  no fever, no chills, no rigor, awake and alert, feeling tired   HEENT: no neck pain, no throat pain   Endocrine: c/o fatigue    Respiratory: no cough, shortness of breath, or wheezing    Cardiovascular: no chest pain    Gastrointestinal: no abdominal pain,no N/V/D   Genito-Urinary: no dysuria, trouble voiding, or hematuria, rash in the pelvic area    Musculoskeletal: no edema   Neurological: c/o generalized weakness    Dermatological: Complains of rash over the chest and itching    Physical Examination:     Blood pressure 132/72, pulse 82, temperature 97.7 F (36.5 C), temperature source Oral, resp. rate 16, height 1.803 m (5\' 11" ), weight 140.161 kg (309 lb), last menstrual period 07/24/2013, SpO2 100.00%, unknown if currently breastfeeding.     General Appearance: Comfortable, and in no acute distress. Awake and alert   HEENT: Pupils are equal, round, and reactive to light.    Lungs:  Decreased breath sounds   Heart: RRR   Chest: Symmetric chest wall expansion.    Abdomen: soft ,non tender,no hepatosplenomegaly   Neurological: No focal deficit   Extremities: No edema, decreased erythema over the genitalia, rash improving    Laboratory And Diagnostic Studies:     Recent Labs   [facility] 08/03/13 2315    WBC 8.89    HGB 12.0    HCT 34.4*    PLT 346     Recent Labs   Basename 08/03/13 2315    NA 134*    K 4.0    CL 99    CO2  21*    BUN 5.0*    CREAT 0.8    GLU 344*    CA 9.5     Recent Labs   Basename 08/03/13 2315    AST 15    ALT 11    ALKPHOS 113    PROT 6.9    ALB 3.2*       Current Meds:      Scheduled Meds: PRN Meds:         amoxicillin-clavulanate 1 tablet Oral Q12H SCH   fluconazole 400 mg Intravenous Q24H SCH   fluconazole 150 mg Oral QHS   lisinopril 20 mg Oral Daily   metFORMIN 1,000 mg Oral BID Meals   [DISCONTINUED] piperacillin-tazobactam 4.5 g Intravenous Q8H   [DISCONTINUED] vancomycin 2,000 mg Intravenous Q12H   [DISCONTINUED] vancomycin 2,250 mg Intravenous Q12H       Continuous Infusions:         albuterol 2 puff BID PRN   dextrose 15 g of glucose PRN   dextrose 25 mL PRN   diphenhydrAMINE 6.5 mg Q6H PRN   glucagon (rDNA) 1 mg PRN   insulin aspart 1-5 Units TID AC PRN   morphine 4 mg Q2H  PRN         Haaris Metallo A. Janalyn Rouse, M.D.  08/06/2013  10:28 AM

## 2013-08-06 NOTE — Progress Notes (Signed)
Case Management Initial Discharge Planning Assessment    PLAN OF CARE: Pt states she is homeless and was recently evicted from her rented room where she lives with her husband. Pt is not in agreement with her discharge today, but it was explained that she has been medically cleared from discharge by her attending MD and ID. Pt was givens scripts and given a list of shelters in the area. Pt contacted her husband to pick her up, pt was provided scrubs and non-skid socks for transport, husband to bring her shoes.         Psychosocial/Demographic Information   Name of interviewee: Patient    Healthcare Decision Maker (HDM) (if other than the patient) Patient    HDM - Relationship to Patient Self    HDM - Contact Information [phone]      Pt lives with Spouse    Type of residence where patient lives Living Arrangements: Spouse/significant other]   ]   ]     Prior level of functioning (ambulation & ADLs)  ]Pt is ambulatory without assist    ]     Correct Insurance listed on face sheet - verified with the patient/HDM YES       Any additional emergency contacts? Extended Emergency Contact Information  Primary Emergency Contact: Awanda [place]  Home Phone: [phone]  Relation: Mother  Secondary Emergency Contact: Burnard Bunting [place]  Home Phone: [phone]  Relation: Sister   Does the patient have an Advance Directive? <no information>  Advance Directive: Patient does not have advance directive]     Is the POA/Guardianship documentation in shadow chart? (if applicable)  Healthcare Agent Appointed: No]  Healthcare Agent's Name: none]  Healthcare Agent's Phone Number: none]   Source of Income (SSDI. SSI. Social Security, pension, employment, Catering manager) Not employed      Economist in Place  Name of Primary Care Physician verified in patient banner (update in patient banner if not listed).  If no PCP, call [phone] with patient in room to get them  connected with a PCP. Mariana Single, MD  [phone]   What DME does the patient currently own/have at home? (rolling walker, hospital bed, home O2, BiPAP/CPAP, bedside commode, cane, hoyer lift)  ]  Assistive Devices: None]   ]   Has the patient been to an Acute Rehab or SNF in the past?  If so, where? NA    Does the patient currently have home health or hospice/palliative services in place?  If so, list agency name. NA    Would the patient benefit from a PT/OT order? If so, is it ordered? NA    Does the patient already have community dialysis set up?  If so, where? NA      Readmission Assessment  Current LACE Score Reviewed? Yes/No YES    Is this patient an inpatient to inpatient 30 day readmission? YES    Does the patient have difficulty obtaining his/her medications? NO   Follow-up appt made with: [facility], [facility] or private pcp? PCP   Does the patient have difficulty getting to his/her appointment? NO       Anticipated Discharge Plan  Discussed Anticipated Discharge Date and Discharge Disposition Possibilities with: _X__Patient   ___Healthcare Decision Maker  ___Other   Anticipated Disposition: Option A Pt states she is homeless, list of shelters provided to the patient   Anticipated Disposition: Option B    Who will  transport the patient when ready for discharge? (offer wheelchair [place] service if patient/family cannot identify transport plan) Husband    If applicable, were SNF or Hospice choices provided? NA    Palliative Care Consult needed? (if yes, contact attending MD)  NA    Geriatrics Consult needed? (if yes, contact attending MD) NA    [facility]lderlink Referral needed? (if yes, refer through [facility]) NA    TCM Referral needed? (if yes, refer through [facility]) NA    PACE Referral needed? (if yes, refer through [facility]) NA    Are there any potential barriers to discharge identified?      ___Lack of Insurance  ___Lack of Health Literacy  ___Undocumented  ___No resources for meds or medical  care  ___Transportation issues  ___Language/Cultural/Spiritual  ___Cognitive level / capacity  ___Psychiatric or substance abuse issues  _X__Co-morbidities  ___Potential abuse or neglect  ___Safety issues in the home  X__Potential placement issues  _X__Pt / family disagreement with d/c plan  ___Lack of family support  ___Lack of extended family / friend support  ___Home Estate agent (multi-level home/access          issues)   ___ NONE     Inpatient Medicare/Medicare HMO Patients Only  Was an initial IMM signed within 24 hours of admission?  (Look in Media Tab, Documents Table or Shadow Chart)  ]NA        Uninsured Patients Only  If patient has a spouse, does your spouse have insurance under his/her place of employment? NO   Did the patient sign up for insurance through the Affordable Care Act? NO        COMMENTS:

## 2013-08-12 NOTE — Discharge Summary (Signed)
DISCHARGE NOTE    Date Time: 08/12/2013 2:07 PM  Patient Name: GYIRSWN,IOEVOJJK Y  Attending Physician: No att. providers found    Date of Admission:   08/03/2013    Date of Discharge:   08/06/13    Reason for Admission:   Vulvar cellulitis [616.10]  Vulvar cellulitis      Discharge Dx:   Vulvar cellulitis Vaginal candidiasis Diabetes mellitus Asthma      Consultations:   ID    Hospital Course:   Ms. Herd is a 38 year old African American female with a history of   diabetes mellitus, asthma, and is admitted because of rash and irritation   over her vulva causing pain. Her Genital exam showed vulvar dry rash/fungal with surrounding erythema. Admitted for:    1. Vulvar cellulitis.   2. Fungal rash.   3. Diabetes mellitus.   4. Asthma.    She was put on antifungal and ABC empirically and [place] with nystatin cream and PO antibiotics. Pts resisted the [place] but she had no reason to keep her IP.        Discharge Medications:     Discharge Medication List as of 08/06/2013  3:25 PM      START taking these medications    Details   fluconazole (DIFLUCAN) 100 MG tablet Take 1 tablet (100 mg total) by mouth daily., Starting 08/06/2013, Until Tue 08/13/13, Print         CONTINUE these medications which have CHANGED    Details   clotrimazole-betamethasone (LOTRISONE) cream Apply topically 2 (two) times daily., Starting 08/06/2013, Until Discontinued, Print      nystatin (MYCOSTATIN) cream Apply topically 2 (two) times daily., Starting 08/06/2013, Until Discontinued, Print         CONTINUE these medications which have NOT CHANGED    Details   albuterol (PROAIR HFA) 108 (90 BASE) MCG/ACT inhaler Inhale 2 puffs into the lungs every 4 (four) hours as needed for Wheezing., Starting 07/02/2013, Until Wed 07/02/14, Print      lisinopril (PRINIVIL,ZESTRIL) 20 MG tablet Take 20 mg by mouth daily., Until Discontinued, Historical Med      metFORMIN (GLUCOPHAGE) 500 MG tablet Take 2 tablets (1,000 mg total) by mouth 2 (two) times daily with meals.,  Starting 07/02/2013, Until Discontinued, Print         STOP taking these medications       amoxicillin-clavulanate (AUGMENTIN) 875-125 MG per tablet                Discharge Instructions:      Follow-up with PCP in 1 week.      Sonny Dandy, MD  08/12/2013  .2:07 PM

## 2014-04-20 ENCOUNTER — Emergency Department: Payer: Medicare (Managed Care)

## 2014-04-20 ENCOUNTER — Emergency Department
Admission: EM | Admit: 2014-04-20 | Discharge: 2014-04-20 | Disposition: A | Discharge status: Home / Self Care | Payer: Medicare (Managed Care) | Attending: Emergency Medicine | Admitting: Emergency Medicine

## 2014-04-20 DIAGNOSIS — J45909 Unspecified asthma, uncomplicated: Secondary | ICD-10-CM | POA: Insufficient documentation

## 2014-04-20 DIAGNOSIS — E119 Type 2 diabetes mellitus without complications: Secondary | ICD-10-CM | POA: Insufficient documentation

## 2014-04-20 DIAGNOSIS — M25512 Pain in left shoulder: Secondary | ICD-10-CM | POA: Insufficient documentation

## 2014-04-20 DIAGNOSIS — Z87891 Personal history of nicotine dependence: Secondary | ICD-10-CM | POA: Insufficient documentation

## 2014-04-20 MED ORDER — TRAMADOL HCL 50 MG PO TABS
100.0000 mg | ORAL_TABLET | Freq: Once | ORAL | Status: AC
Start: 2014-04-20 — End: 2014-04-20
  Administered 2014-04-20: 100 mg via ORAL
  Filled 2014-04-20: qty 2

## 2014-04-20 MED ORDER — TRAMADOL HCL 50 MG PO TABS
50.0000 mg | ORAL_TABLET | Freq: Four times a day (QID) | ORAL | Status: DC | PRN
Start: 2014-04-20 — End: 2014-11-06

## 2014-04-20 NOTE — Discharge Instructions (Signed)
Joint Pain     You have been seen for joint pain.     Many things can cause pain in your joints. Having joint pain means that you often have inflammation in your joints.      Some things that can cause joint pain are:  · Rheumatoid arthritis (inflammation of the joints).  · Trauma or injury to your joints.  · A viral infection.  · Overuse of your joints.  · Obesity that causes excess wear and tear on your joints.  · Gout (swelling of the joints).  · Osteoarthritis (breakdown of cartilage in the joints).  · Inflammation of a joint tendon (tendinitis).     It is not yet known why you are having joint pains. You might need another exam or more tests to find out why you have these symptoms. At this time, the cause of your symptoms does not seem dangerous. You don't need to stay in the hospital.     Some things you can try at home are:  · Rest the affected joint.  · Frequent stretching.  · Massage.  · NSAID medications like ibuprofen (Advil® or Motrin®), naproxen (Aleve®, Naprosyn®).  · Elevating the joints that hurt.     Follow the instructions for any medication you are prescribed.      Follow up with your doctor in 7 days.     We don’t believe your condition is dangerous right now. However, you need to be careful. Sometimes a problem that seems small can get serious later. Therefore, it is very important for you to come back here or go to the nearest Emergency Department if you don't get better or your symptoms get worse.     YOU SHOULD SEEK MEDICAL ATTENTION IMMEDIATELY, EITHER HERE OR AT THE NEAREST EMERGENCY DEPARTMENT, IF ANY OF THE FOLLOWING OCCUR:     · You have a fever (temperature higher than 100.4°F or 38°C).  · Your pain does not go away or gets worse.  · You notice redness over the joint.  · Your painful joint gets very swollen.  · You don't feel better after treatment.  · Any other symptoms, concerns, or not getting better as expected.     If you can't follow up with your doctor, or if at any time you feel  you need to be rechecked or seen again, come back here or go to the nearest emergency department.

## 2014-04-20 NOTE — ED Notes (Signed)
Left shoulder pain since caught in train door in May, ongoing problem.

## 2014-04-20 NOTE — ED Notes (Signed)
Pt requesting shoulder "cast". Pt placed in shoulder sling with ace bandage to L upper arm per Dr. Neita Carp. Pt educated on sling usage and importance of ROM exercises until follow up with ortho. Verbalized understanding

## 2014-04-21 NOTE — ED Provider Notes (Signed)
Physician/Midlevel provider first contact with patient: 04/20/14 1539         History     Chief Complaint   Patient presents with   . Shoulder Pain     HPI  patient is a 38 year old female with a history of shoulder trauma to both shoulders in the last year.  Multiple evaluations in the past.  States she was told she had a puncture of her left shoulder and some degenerative changes.  Now with worsening pain with movement.  Some episodic paresthesias of her hand.  No anterior chest pain.  History of non-insulin-dependent diabetes.  Pain rated as moderate to severe increased with adduction and abduction.        Past Medical History   Diagnosis Date   . Diabetes mellitus without complication    . Asthma without status asthmaticus    . Anemia    . Depression    . Anemia    . Panic attack        Past Surgical History   Procedure Laterality Date   . Cesarean section     . Cholecystectomy     . Tonsillectomy     . Hernia repair         Family History   Problem Relation Age of Onset   . Diabetes Mother    . Diabetes Sister    . Diabetes Maternal Aunt    . Hypertension Paternal Grandmother        Social  History   Substance Use Topics   . Smoking status: Former Games developer   . Smokeless tobacco: Not on file   . Alcohol Use: No       .     Allergies   Allergen Reactions   . Percocet [Oxycodone-Acetaminophen]      Unknown reaction     . Tape        Discharge Medication List as of 04/20/2014  4:51 PM      CONTINUE these medications which have NOT CHANGED    Details   albuterol (PROAIR HFA) 108 (90 BASE) MCG/ACT inhaler Inhale 2 puffs into the lungs every 4 (four) hours as needed for Wheezing., Starting 07/02/2013, Until Wed 07/02/14, Print      clotrimazole-betamethasone (LOTRISONE) cream Apply topically 2 (two) times daily., Starting 08/06/2013, Until Discontinued, Print      lisinopril (PRINIVIL,ZESTRIL) 20 MG tablet Take 20 mg by mouth daily., Until Discontinued, Historical Med      metFORMIN (GLUCOPHAGE) 500 MG tablet Take 2  tablets (1,000 mg total) by mouth 2 (two) times daily with meals., Starting 07/02/2013, Until Discontinued, Print      nystatin (MYCOSTATIN) cream Apply topically 2 (two) times daily., Starting 08/06/2013, Until Discontinued, Print              Review of Systems   all reviewed negative   Physical Exam    BP: 142/77 mmHg, Heart Rate: 93, Temp: 98.2 F (36.8 C), Resp Rate: 18, SpO2: 94 %, Weight: 137.44 kg    Physical Exam   CONSTITUTIONAL Patient is afebrile, Vital signs reviewed, Patient appears comfortable, Alert and oriented X 3.  HEAD Atraumatic, Normocephalic.  EYES Eyes are normal to inspection, PERRL, No discharge from eyes,  Extraocular muscles intact, Sclera are normal, Conjunctiva are normal.  ENT Posterior pharynx normal, Mouth normal to inspection.  NECK Normal ROM, No meningeal signs, Cervical spine diffuse lateral tenderness   RESPIRATORY CHEST Breath sounds normal, No respiratory distress.  CARDIOVASCULAR   RRR, No  murmurs, Normal S1 S2, No rub.  ABDOMEN Abdomen is nontender, No distension.  UPPER EXTREMITY and with range of motion of the shoulder.  Normal grasp of the hand.  Radial pulses 2+ diffuse tenderness of the shoulder unable to abduct.  However, does move arm somewhat.  When distracted.    LOWER EXTREMITY Inspection normal, Normal range of motion.   NEURO Cranial nerves intact. No focal motor or sensory deficits. Cerebellar intact.   SKIN Skin is warm. Skin is dry.           MDM and ED Course     ED Medication Orders     Start     Status Ordering Provider    04/20/14 1649  traMADol (ULTRAM) tablet 100 mg   Once     Route: Oral  Ordered Dose: 100 mg     Last MAR action:  Given Dianelly Ferran E              MDM  C-spine x-ray negative.  Patient requesting a cast of her shoulder.  Explained to her that was  not appropriate care referral to shoulder surgeon and offered Ace wrap        Procedures    Clinical Impression & Disposition     Clinical Impression  Final diagnoses:   Left shoulder pain         ED Disposition     Discharge SHAKAYLA HICKOX discharge to home/self care.    Condition at disposition: Stable             Discharge Medication List as of 04/20/2014  4:51 PM      START taking these medications    Details   traMADol (ULTRAM) 50 MG tablet Take 1 tablet (50 mg total) by mouth every 6 (six) hours as needed for Pain. Do not drive or operate machinery while taking this medication, Starting 04/20/2014, Until Discontinued, Print                         Dorethea Clan, MD  04/21/14 2245

## 2014-06-01 ENCOUNTER — Emergency Department: Payer: Medicare (Managed Care)

## 2014-06-01 ENCOUNTER — Encounter (HOSPITAL_BASED_OUTPATIENT_CLINIC_OR_DEPARTMENT_OTHER): Payer: Self-pay

## 2014-06-01 ENCOUNTER — Emergency Department
Admission: EM | Admit: 2014-06-01 | Discharge: 2014-06-01 | Disposition: A | Discharge status: Home / Self Care | Payer: Medicare (Managed Care) | Attending: Emergency Medicine | Admitting: Emergency Medicine

## 2014-06-01 DIAGNOSIS — E119 Type 2 diabetes mellitus without complications: Secondary | ICD-10-CM | POA: Insufficient documentation

## 2014-06-01 DIAGNOSIS — J45909 Unspecified asthma, uncomplicated: Secondary | ICD-10-CM | POA: Insufficient documentation

## 2014-06-01 DIAGNOSIS — M25552 Pain in left hip: Secondary | ICD-10-CM | POA: Insufficient documentation

## 2014-06-01 LAB — CBC AND DIFFERENTIAL
Basophils Absolute Automated: 0.03 10*3/uL (ref 0.00–0.20)
Basophils Automated: 0 %
Eosinophils Absolute Automated: 0.24 10*3/uL (ref 0.00–0.70)
Eosinophils Automated: 3 %
Hematocrit: 33.8 % — ABNORMAL LOW (ref 37.0–47.0)
Hgb: 11.9 g/dL — ABNORMAL LOW (ref 12.0–16.0)
Immature Granulocytes Absolute: 0.01 10*3/uL
Immature Granulocytes: 0 %
Lymphocytes Absolute Automated: 1.93 10*3/uL (ref 0.50–4.40)
Lymphocytes Automated: 25 %
MCH: 27.6 pg — ABNORMAL LOW (ref 28.0–32.0)
MCHC: 35.2 g/dL (ref 32.0–36.0)
MCV: 78.4 fL — ABNORMAL LOW (ref 80.0–100.0)
MPV: 10.4 fL (ref 9.4–12.3)
Monocytes Absolute Automated: 0.54 10*3/uL (ref 0.00–1.20)
Monocytes: 7 %
Neutrophils Absolute: 5.06 10*3/uL (ref 1.80–8.10)
Neutrophils: 65 %
Nucleated RBC: 0 /100 WBC (ref 0–1)
Platelets: 338 10*3/uL (ref 140–400)
RBC: 4.31 10*6/uL (ref 4.20–5.40)
RDW: 14 % (ref 12–15)
WBC: 7.8 10*3/uL (ref 3.50–10.80)

## 2014-06-01 LAB — COMPREHENSIVE METABOLIC PANEL
ALT: 16 U/L (ref 0–55)
AST (SGOT): 14 U/L (ref 5–34)
Albumin/Globulin Ratio: 0.9 (ref 0.9–2.2)
Albumin: 3.1 g/dL — ABNORMAL LOW (ref 3.5–5.0)
Alkaline Phosphatase: 98 U/L (ref 37–106)
Anion Gap: 13 (ref 5.0–15.0)
BUN: 12 mg/dL (ref 7–19)
Bilirubin, Total: 0.2 mg/dL (ref 0.2–1.2)
CO2: 19 mEq/L — ABNORMAL LOW (ref 22–29)
Calcium: 9.3 mg/dL (ref 8.5–10.5)
Chloride: 99 mEq/L — ABNORMAL LOW (ref 100–111)
Creatinine: 0.9 mg/dL (ref 0.6–1.0)
Globulin: 3.5 g/dL (ref 2.0–3.6)
Glucose: 489 mg/dL — ABNORMAL HIGH (ref 70–100)
Potassium: 4.1 mEq/L (ref 3.5–5.1)
Protein, Total: 6.6 g/dL (ref 6.0–8.3)
Sodium: 131 mEq/L — ABNORMAL LOW (ref 136–145)

## 2014-06-01 LAB — URINALYSIS, REFLEX TO MICROSCOPIC EXAM IF INDICATED
Bilirubin, UA: NEGATIVE
Glucose, UA: >=500 — AB
Ketones UA: NEGATIVE
Leukocyte Esterase, UA: NEGATIVE
Nitrite, UA: NEGATIVE
Protein, UR: NEGATIVE
Specific Gravity UA: 1.028 (ref 1.001–1.035)
Urine pH: 6 (ref 5.0–8.0)
Urobilinogen, UA: NORMAL mg/dL

## 2014-06-01 LAB — URINE HCG QUALITATIVE: Urine HCG Qualitative: NEGATIVE

## 2014-06-01 LAB — GLUCOSE WHOLE BLOOD - POCT: Whole Blood Glucose POCT: 348 mg/dL — ABNORMAL HIGH (ref 70–100)

## 2014-06-01 LAB — GFR: EGFR: >60

## 2014-06-01 MED ORDER — NAPROXEN 500 MG PO TABS
500.0000 mg | ORAL_TABLET | Freq: Two times a day (BID) | ORAL | Status: DC
Start: 2014-06-01 — End: 2014-11-07

## 2014-06-01 MED ORDER — KETOROLAC TROMETHAMINE 30 MG/ML IJ SOLN
30.0000 mg | Freq: Once | INTRAMUSCULAR | Status: AC
Start: 2014-06-01 — End: 2014-06-01
  Administered 2014-06-01: 30 mg via INTRAVENOUS
  Filled 2014-06-01: qty 1

## 2014-06-01 MED ORDER — SODIUM CHLORIDE 0.9 % IV BOLUS
1000.0000 mL | Freq: Once | INTRAVENOUS | Status: AC
Start: 2014-06-01 — End: 2014-06-01
  Administered 2014-06-01: 1000 mL via INTRAVENOUS

## 2014-06-01 NOTE — ED Notes (Signed)
See quick triage
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
## 2014-06-01 NOTE — ED Notes (Signed)
Bed: A11  Expected date:   Expected time:   Means of arrival:   Comments:  Medic 431 when clean

## 2014-06-01 NOTE — ED Provider Notes (Signed)
ED Signout     Patient received from: Dr. Pedro Earls at [phone]    Signout notes: Pending imaging results      Diagnostic Study Results     The results of the diagnostic studies below were reviewed by the ED provider:    Labs  Results     Procedure Component Value Units Date/Time    Glucose Whole Blood - POCT [[id number]]  (Abnormal) Collected:  06/01/14 0712     POCT - Glucose Whole blood 348 (H) mg/dL Updated:  11/91/47 8295    UA with reflex to micro [[id number]]  (Abnormal) Collected:  06/01/14 0353    Specimen Information:  Urine Updated:  06/01/14 0518     Urine Type Clean Catch      Color, UA Straw      Clarity, UA Clear      Specific Gravity UA 1.028      Urine pH 6.0      Leukocyte Esterase, UA Negative      Nitrite, UA Negative      Protein, UR Negative      Glucose, UA >=500 (A)      Ketones UA Negative      Urobilinogen, UA Normal mg/dL      Bilirubin, UA Negative      Blood, UA Large (A)      RBC, UA TNTC (A) /hpf      WBC, UA 0 - 5 /hpf      Squamous Epithelial Cells, Urine 0 - 5 /hpf     Urine HCG, Qualitative [[id number]] Collected:  06/01/14 0353    Specimen Information:  Urine Updated:  06/01/14 0437     Urine HCG Qualitative Negative     Comprehensive metabolic panel [[id number]]  (Abnormal) Collected:  06/01/14 0353    Specimen Information:  Blood Updated:  06/01/14 0433     Glucose 489 (H) mg/dL      BUN 12 mg/dL      Creatinine 0.9 mg/dL      Sodium 027 (L) mEq/L      Potassium 4.1 mEq/L      Chloride 99 (L) mEq/L      CO2 19 (L) mEq/L      CALCIUM 9.3 mg/dL      Protein, Total 6.6 g/dL      Albumin 3.1 (L) g/dL      AST (SGOT) 14 U/L      ALT 16 U/L      Alkaline Phosphatase 98 U/L      Bilirubin, Total 0.2 mg/dL      Globulin 3.5 g/dL      Albumin/Globulin Ratio 0.9      Anion Gap 13.0     GFR [[id number]] Collected:  06/01/14 0353     EGFR >60.0 Updated:  06/01/14 0433    CBC with differential [[id number]]  (Abnormal) Collected:  06/01/14 0353    Specimen Information:  Blood / Blood Updated:   06/01/14 0412     WBC 7.80 x10 3/uL      Hgb 11.9 (L) g/dL      Hematocrit 87.5 (L) %      Platelets 338 x10 3/uL      RBC 4.31 x10 6/uL      MCV 78.4 (L) fL      MCH 27.6 (L) pg      MCHC 35.2 g/dL      RDW 14 %      MPV 10.4 fL  Neutrophils 65 %      Lymphocytes Automated 25 %      Monocytes 7 %      Eosinophils Automated 3 %      Basophils Automated 0 %      Immature Granulocyte 0 %      Nucleated RBC 0 /100 WBC      Neutrophils Absolute 5.06 x10 3/uL      Abs Lymph Automated 1.93 x10 3/uL      Abs Mono Automated 0.54 x10 3/uL      Abs Eos Automated 0.24 x10 3/uL      Absolute Baso Automated 0.03 x10 3/uL      Absolute Immature Granulocyte 0.01 x10 3/uL     Beta-Hydroxybutyrate [[id number]] Collected:  06/01/14 0353     Updated:  06/01/14 0409          Radiologic Studies  Radiology Results (24 Hour)     Procedure Component Value Units Date/Time    Femur Left AP and Lateral [[id number]]     Order Status:  Sent Updated:  06/01/14 0720    CT Pelvis without Contrast [[id number]]     Order Status:  Sent Updated:  06/01/14 0715          Vital Signs  BP 118/62 mmHg  Pulse 80  Temp(Src) 97.7 F (36.5 C)  Resp 18  Ht 5\' 11"  (1.803 m)  Wt 144.244 kg  BMI 44.37 kg/m2  SpO2 97%  LMP 06/23/2013  Patient Vitals for the past 24 hrs:   BP Temp Pulse Resp SpO2 Height Weight   06/01/14 0803 118/62 mmHg - 80 - 97 % - -   06/01/14 0700 119/63 mmHg - 94 18 - - -   06/01/14 0621 138/65 mmHg - 78 16 100 % - -   06/01/14 0307 - - - - - 5\' 11"  (1.803 m) 144.244 kg   06/01/14 0304 (!) 174/101 mmHg 97.7 F (36.5 C) 81 18 99 % 5\' 11"  (1.803 m) 144.244 kg         Scribe and MD Attestations     I have assumed care of this patient: Yes    ICari Caraway, MD, personally performed the services documented.  Dorothy [name] is scribing for me on [facility] Y. I reviewed and confirm the accuracy of the information in this medical record.     I, Dorothy [name], am serving as a scribe to document services personally  performed by Cari Caraway, MD based on the provider's statements to me.     Rendering Provider: Dr. 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 Will get formal reading from Musculoskeletal readers later in the day. Informed pt of result and plan for [place] and follow up with her PCP        Clinical Impression:  1. Left hip pain        ED Disposition     Discharge Dorothy [name] discharge to home/self care.    Condition at disposition: Stable              Forest Gleason, MD  06/02/14  0710

## 2014-06-01 NOTE — Discharge Instructions (Signed)
Dear [name]:    I appreciate your choosing the [facility] for your healthcare needs, and hope your visit today was EXCELLENT.    Instructions:  Please follow-up with your Primary Care Physician in 1-2 days    Return to the Emergency Department for any worsening symptoms or concerns.    Below is some information that our patients often find helpful.    We wish you good health and please do not hesitate to contact us if we can ever be of any assistance.    Sincerely,  Forest Gleason, MD  [facility]    ________________________________________________________________    If you do not continue to improve or your condition worsens, please contact your doctor or return immediately to the Emergency Department.    Thank you for choosing [facility] for your emergency care needs.  We strive to provide EXCELLENT care to you and your family.      DOCTOR REFERRALS  Call [phone] if you need any further referrals and we can help you find a primary care doctor or specialist.  Also, available online at:  [email or web address]    YOUR CONTACT INFORMATION  Before leaving please check with registration to make sure we have an up-to-date contact number.  You can call registration at [phone] to update your information.  For questions about your hospital bill, please call [phone].  For questions about your Emergency Dept Physician bill please call [phone].      FREE HEALTH SERVICES  If you need help with health or social services, please call 2-1-1 for a free referral to resources in your area.  2-1-1 is a free service connecting people with information on health insurance, free clinics, pregnancy, mental health, dental care, food assistance, housing, and substance abuse counseling.  Also, available online at:  http://[email or web address].org    MEDICAL RECORDS AND TESTS  Certain laboratory test results do not come back the  same day, for example urine cultures.   We will contact you if other important findings are noted.  Radiology films are often reviewed again to ensure accuracy.  If there is any discrepancy, we will notify you.      Please call [phone] to pick up a complimentary CD of any radiology studies performed.  If you or your doctor would like to request a copy of your medical records, please call [phone].      ORTHOPEDIC INJURY   Please know that significant injuries can exist even when an initial x-ray is read as normal or negative.  This can occur because some fractures (broken bones) are not initially visible on x-rays.  For this reason, close outpatient follow-up with your primary care doctor or bone specialist (orthopedist) is required.    MEDICATIONS AND FOLLOWUP  Please be aware that some prescription medications can cause drowsiness.  Use caution when driving or operating machinery.    The examination and treatment you have received in our Emergency Department is provided on an emergency basis, and is not intended to be a substitute for your primary care physician.  It is important that your doctor checks you again and that you report any new or remaining problems at that time.      24 HOUR PHARMACIES  [facility] - [address], [place], [place] [address] (1.4 miles, 7 minutes)  [facility] - [address] [place], [place] [address] (6.5 miles, 13 minutes)  Handout with  directions available on request

## 2014-06-01 NOTE — ED Notes (Signed)
RN hourly rounding: Pt sleeping. When awoken, pt states pain is the same, but does not provide a number for pain scale. VS updated.

## 2014-06-05 LAB — BETA-HYDROXYBUTYRATE

## 2014-06-20 ENCOUNTER — Ambulatory Visit
Admission: RE | Admit: 2014-06-20 | Discharge: 2014-06-20 | Disposition: A | Discharge status: Home / Self Care | Payer: Medicare (Managed Care) | Source: Ambulatory Visit | Attending: Acupuncturist | Admitting: Acupuncturist

## 2014-06-20 ENCOUNTER — Other Ambulatory Visit: Payer: Self-pay | Admitting: Acupuncturist

## 2014-06-20 DIAGNOSIS — S8291XK Unspecified fracture of right lower leg, subsequent encounter for closed fracture with nonunion: Secondary | ICD-10-CM | POA: Insufficient documentation

## 2014-06-20 DIAGNOSIS — M79672 Pain in left foot: Secondary | ICD-10-CM

## 2014-07-28 ENCOUNTER — Emergency Department: Payer: Medicare (Managed Care)

## 2014-07-28 ENCOUNTER — Emergency Department
Admission: EM | Admit: 2014-07-28 | Discharge: 2014-07-28 | Disposition: A | Discharge status: Home / Self Care | Payer: Medicare (Managed Care) | Attending: Emergency Medicine | Admitting: Emergency Medicine

## 2014-07-28 DIAGNOSIS — Z9049 Acquired absence of other specified parts of digestive tract: Secondary | ICD-10-CM | POA: Insufficient documentation

## 2014-07-28 DIAGNOSIS — R0789 Other chest pain: Secondary | ICD-10-CM

## 2014-07-28 DIAGNOSIS — Z794 Long term (current) use of insulin: Secondary | ICD-10-CM | POA: Insufficient documentation

## 2014-07-28 DIAGNOSIS — R1033 Periumbilical pain: Secondary | ICD-10-CM | POA: Insufficient documentation

## 2014-07-28 DIAGNOSIS — R079 Chest pain, unspecified: Secondary | ICD-10-CM | POA: Insufficient documentation

## 2014-07-28 DIAGNOSIS — E1165 Type 2 diabetes mellitus with hyperglycemia: Secondary | ICD-10-CM | POA: Insufficient documentation

## 2014-07-28 DIAGNOSIS — R739 Hyperglycemia, unspecified: Secondary | ICD-10-CM

## 2014-07-28 DIAGNOSIS — J45909 Unspecified asthma, uncomplicated: Secondary | ICD-10-CM | POA: Insufficient documentation

## 2014-07-28 HISTORY — DX: Other seasonal allergic rhinitis: J30.2

## 2014-07-28 LAB — CBC AND DIFFERENTIAL
Basophils Absolute Automated: 0.02 10*3/uL (ref 0.00–0.20)
Basophils Automated: 0 %
Eosinophils Absolute Automated: 0.16 10*3/uL (ref 0.00–0.70)
Eosinophils Automated: 2 %
Hematocrit: 37 % (ref 37.0–47.0)
Hgb: 13.1 g/dL (ref 12.0–16.0)
Immature Granulocytes Absolute: 0.04 10*3/uL
Immature Granulocytes: 0 %
Lymphocytes Absolute Automated: 2.35 10*3/uL (ref 0.50–4.40)
Lymphocytes Automated: 24 %
MCH: 27.3 pg — ABNORMAL LOW (ref 28.0–32.0)
MCHC: 35.4 g/dL (ref 32.0–36.0)
MCV: 77.2 fL — ABNORMAL LOW (ref 80.0–100.0)
MPV: 10.1 fL (ref 9.4–12.3)
Monocytes Absolute Automated: 0.5 10*3/uL (ref 0.00–1.20)
Monocytes: 5 %
Neutrophils Absolute: 6.68 10*3/uL (ref 1.80–8.10)
Neutrophils: 69 %
Nucleated RBC: 0 /100 WBC (ref 0–1)
Platelets: 330 10*3/uL (ref 140–400)
RBC: 4.79 10*6/uL (ref 4.20–5.40)
RDW: 14 % (ref 12–15)
WBC: 9.71 10*3/uL (ref 3.50–10.80)

## 2014-07-28 LAB — BASIC METABOLIC PANEL
Anion Gap: 13 (ref 5.0–15.0)
BUN: 9 mg/dL (ref 7–19)
CO2: 21 mEq/L — ABNORMAL LOW (ref 22–29)
Calcium: 9.3 mg/dL (ref 8.5–10.5)
Chloride: 101 mEq/L (ref 100–111)
Creatinine: 0.7 mg/dL (ref 0.6–1.0)
Glucose: 220 mg/dL — ABNORMAL HIGH (ref 70–100)
Potassium: 3.9 mEq/L (ref 3.5–5.1)
Sodium: 135 mEq/L — ABNORMAL LOW (ref 136–145)

## 2014-07-28 LAB — HCG, SERUM, QUALITATIVE: Hcg Qualitative: NEGATIVE

## 2014-07-28 LAB — TROPONIN I: Troponin I: <0.01 ng/mL (ref 0.00–0.09)

## 2014-07-28 LAB — GFR: EGFR: >60

## 2014-07-28 MED ORDER — KETOROLAC TROMETHAMINE 30 MG/ML IJ SOLN
30.0000 mg | Freq: Once | INTRAMUSCULAR | Status: AC
Start: 2014-07-28 — End: 2014-07-28
  Administered 2014-07-28: 30 mg via INTRAVENOUS
  Filled 2014-07-28: qty 1

## 2014-07-28 NOTE — Discharge Instructions (Signed)
Dear [name]:    I appreciate your choosing the [facility] for your healthcare needs, and hope your visit today was EXCELLENT.    Instructions:  Please follow-up with Ernesto Rutherford, NP (your primary care physician) in 1 month as scheduled or sooner if her blood sugar continues to run high (continually above 300). Please also follow up with a GYN such as Dr. Vedia Coffer (GYN) for the next available appointment to have vaginal discharge further evaluated. You may take Tylenol and Ibuprofen as needed for pain.     Return to the Emergency Department for any worsening symptoms or concerns.    Below is some information that our patients often find helpful.    We wish you good health and please do not hesitate to contact us if we can ever be of any assistance.    Sincerely,  Melida Gimenez, MD  [facility]    ________________________________________________________________    If you do not continue to improve or your condition worsens, please contact your doctor or return immediately to the Emergency Department.    Thank you for choosing [facility] for your emergency care needs.  We strive to provide EXCELLENT care to you and your family.      DOCTOR REFERRALS  Call [phone] if you need any further referrals and we can help you find a primary care doctor or specialist.  Also, available online at:  [email or web address]    YOUR CONTACT INFORMATION  Before leaving please check with registration to make sure we have an up-to-date contact number.  You can call registration at [phone] to update your information.  For questions about your hospital bill, please call [phone].  For questions about your Emergency Dept Physician bill please call [phone].      FREE HEALTH SERVICES  If you need help with health or social services, please call 2-1-1 for a free referral to resources in your area.  2-1-1 is a free service  connecting people with information on health insurance, free clinics, pregnancy, mental health, dental care, food assistance, housing, and substance abuse counseling.  Also, available online at:  http://www.[facility].org    MEDICAL RECORDS AND TESTS  Certain laboratory test results do not come back the same day, for example urine cultures.   We will contact you if other important findings are noted.  Radiology films are often reviewed again to ensure accuracy.  If there is any discrepancy, we will notify you.      Please call [phone] to pick up a complimentary CD of any radiology studies performed.  If you or your doctor would like to request a copy of your medical records, please call [phone].      ORTHOPEDIC INJURY   Please know that significant injuries can exist even when an initial x-ray is read as normal or negative.  This can occur because some fractures (broken bones) are not initially visible on x-rays.  For this reason, close outpatient follow-up with your primary care doctor or bone specialist (orthopedist) is required.    MEDICATIONS AND FOLLOWUP  Please be aware that some prescription medications can cause drowsiness.  Use caution when driving or operating machinery.    The examination and treatment you have received in our Emergency Department is provided on an emergency basis, and is not intended to be a substitute for your primary care physician.  It is important that your doctor  checks you again and that you report any new or remaining problems at that time.      [place], [place], [place] [phone] (1.4 miles, 7 minutes)  [place], [place], [place] [phone] (6.5 miles, 13 minutes)  Handout with directions available on request

## 2014-07-28 NOTE — ED Notes (Signed)
Bed: SS 33  Expected date:   Expected time:   Means of arrival:   Comments:  [phone]

## 2014-07-28 NOTE — ED Provider Notes (Signed)
Physician/Midlevel provider first contact with patient: 07/28/14 1701         [facility] EMERGENCY DEPARTMENT HISTORY AND PHYSICAL EXAM    Patient Name: Dorothy [name], Dorothy [name]  Encounter Date:  07/28/2014  Rendering Provider: Melida Gimenez, MD  Patient DOB:  12-24-1975  MRN:  [id number]    History of Presenting Illness     Historian: Pt    39 y.o. female h/o diabetes mellitus, asthma, anemia, depression, panic attack, cholecystectomy, and hernia repair presents via EMS with persistent left-sided dull CP since last PM. Pt notes the pain is now more mild than it was at the onset of sxs. Pt states the pain worsens with both deep breaths and movement. Associated with SOB and nausea. Pt also reports she has also had periumbilical abd pain since his AM. Pt notes she recently started her period, but she states this pain feels different from the abd pain generally associated with her period. Pt reports that she feels like she has a hernia in her lower abd, and she states she has had a right sided inguina hernia repair in the past. Pt also states that she has had intermittent white vaginal discharge for last ~3-4 months that she notes tends to worsen when her blood sugar runs high (~400 - 500). Pt has been evaluated for the vaginal discharge in the past, and she has taken monistat, which she has used without relief. Pt notes she has also been using gold bond with no decrease in the amount of vaginal discharge. No cough.       PMD:  Ernesto Rutherford, NP    Past Medical History     Past Medical History   Diagnosis Date   . Diabetes mellitus without complication    . Asthma without status asthmaticus    . Anemia    . Depression    . Anemia    . Panic attack    . Seasonal allergies        Past Surgical History     Past Surgical History   Procedure Laterality Date   . Cesarean section     . Cholecystectomy     . Tonsillectomy     . Hernia repair         Family History     Family History   Problem Relation Age of Onset   .  Diabetes Mother    . Diabetes Sister    . Diabetes Maternal Aunt    . Hypertension Paternal Grandmother        Social History     History     Social History   . Marital Status: Single     Spouse Name: N/A   . Number of Children: N/A   . Years of Education: N/A     Social History Main Topics   . Smoking status: Never Smoker    . Smokeless tobacco: Not on file   . Alcohol Use: No   . Drug Use: No   . Sexual Activity: Not on file     Other Topics Concern   . Not on file     Social History Narrative       Home Medications     Home medications reviewed by ED MD    Discharge Medication List as of 07/28/2014  7:26 PM      CONTINUE these medications which have NOT CHANGED    Details   ATORVASTATIN CALCIUM PO Take by mouth., Until Discontinued, Historical Med  clotrimazole-betamethasone (LOTRISONE) cream Apply topically 2 (two) times daily., Starting 08/06/2013, Until Discontinued, Print      GABAPENTIN PO Take by mouth., Until Discontinued, Historical Med      Insulin Glargine (LANTUS SC) Inject into the skin., Until Discontinued, Historical Med      Liraglutide (VICTOZA SC) Inject into the skin., Until Discontinued, Historical Med      lisinopril (PRINIVIL,ZESTRIL) 20 MG tablet Take 20 mg by mouth daily., Until Discontinued, Historical Med      metFORMIN (GLUCOPHAGE) 500 MG tablet Take 2 tablets (1,000 mg total) by mouth 2 (two) times daily with meals., Starting 07/02/2013, Until Discontinued, Print      naproxen (NAPROSYN) 500 MG tablet Take 1 tablet (500 mg total) by mouth 2 (two) times daily with meals., Starting 06/01/2014, Until Discontinued, Print      nystatin (MYCOSTATIN) cream Apply topically 2 (two) times daily., Starting 08/06/2013, Until Discontinued, Print      traMADol (ULTRAM) 50 MG tablet Take 1 tablet (50 mg total) by mouth every 6 (six) hours as needed for Pain. Do not drive or operate machinery while taking this medication, Starting 04/20/2014, Until Discontinued, Print      VITAMIN D, ERGOCALCIFEROL, PO  Take by mouth., Until Discontinued, Historical Med             Review of Systems     Constitutional:  No fever  Eyes: No vision changes  ENT: No ST or otalgia  CV:  +CP  Resp:  No cough, +SOB  GI: No V, D, +nausea, +abd pain  GU: +white vaginal discharge  MS:    Skin: No rash  Neuro:  No HA  Psych:    All other systems reviewed and negative      Physical Exam     BP 104/60 mmHg  Pulse 90  Temp(Src) 98.4 F (36.9 C)  Resp 16  Ht 5\' 11"  (1.803 m)  Wt 133.358 kg  BMI 41.02 kg/m2  SpO2 98%  LMP 07/27/2014 (Exact Date)    CONSTITUTIONAL   Patient is afebrile, Vital signs reviewed, Well appearing, Pt appears comfortable, Alert, Morbidly obese.   HEAD  Atraumatic, Normocephalic.  EYES   Eyes are normal to inspection.  ENT  Ear examination normal, Posterior pharynx normal, Mouth normal to inspection.   NECK  No meningeal signs, Cervical spine nontender  RESPIRATORY CHEST  Easily reproducible tenderness with palpation to the left upper chest, Breath sounds normal, No respiratory distress  CARDIOVASCULAR   RRR, No murmurs.  ABDOMEN   Abdomen is nontender, No masses, Bowel sounds normal, No distension, No peritoneal signs  BACK   There is no CVA tenderness, There is no tenderness to palpation, Normal inspection.  UPPER EXTREMITY   Inspection normal.  LOWER EXTREMITY   Inspection normal, No edema.  FEMALE GU  Normal external female genitalia, Some mild erythema to the pubic area but does not appear to be a significant yeast skin infection at this time, No vaginal discharge is noted since pt is on the heaviest day of her menstrual period.  NEURO   Speech normal, Memory normal.  SKIN   Skin is warm, Skin is dry, Skin is normal color.  LYMPHATIC   No adenopathy in neck.  PSYCHIATRIC   Normal affect.      ED Medications Administered     ED Medication Orders     Start Ordered     Status Ordering Provider    07/28/14 1825 07/28/14 1824  ketorolac (TORADOL) injection  30 mg   Once     Route: Intravenous  Ordered Dose: 30 mg      Last MAR action:  Given Enma Maeda          Orders Placed During This Encounter     Orders Placed This Encounter   Procedures   . Chest 2 Views   . CBC with differential   . Basic Metabolic Panel   . Troponin I   . hCG, Qualitative (Pos/Neg)   . GFR   . Cardiac Monitor-May transport OFF monitor   . ECG 12 Lead       Diagnostic Study Results     The results of the diagnostic studies below were reviewed by the ED provider:    Labs  Results     Procedure Component Value Units Date/Time    Basic Metabolic Panel [[id number]]  (Abnormal) Collected:  07/28/14 1730    Specimen Information:  Blood Updated:  07/28/14 1805     Glucose 220 (H) mg/dL      BUN 9 mg/dL      Creatinine 0.7 mg/dL      CALCIUM 9.3 mg/dL      Sodium 409 (L) mEq/L      Potassium 3.9 mEq/L      Chloride 101 mEq/L      CO2 21 (L) mEq/L      Anion Gap 13.0     GFR [[id number]] Collected:  07/28/14 1730     EGFR >60.0 Updated:  07/28/14 1805    Troponin I [[id number]] Collected:  07/28/14 1730    Specimen Information:  Blood Updated:  07/28/14 1804     Troponin I <0.01 ng/mL     hCG, Qualitative (Pos/Neg) [[id number]] Collected:  07/28/14 1730    Specimen Information:  Blood Updated:  07/28/14 1747     Hcg Qualitative Negative     CBC with differential [[id number]]  (Abnormal) Collected:  07/28/14 1730    Specimen Information:  Blood / Blood Updated:  07/28/14 1737     WBC 9.71 x10 3/uL      Hgb 13.1 g/dL      Hematocrit 01.0 %      Platelets 330 x10 3/uL      RBC 4.79 x10 6/uL      MCV 77.2 (L) fL      MCH 27.3 (L) pg      MCHC 35.4 g/dL      RDW 14 %      MPV 10.1 fL      Neutrophils 69 %      Lymphocytes Automated 24 %      Monocytes 5 %      Eosinophils Automated 2 %      Basophils Automated 0 %      Immature Granulocyte 0 %      Nucleated RBC 0 /100 WBC      Neutrophils Absolute 6.68 x10 3/uL      Abs Lymph Automated 2.35 x10 3/uL      Abs Mono Automated 0.50 x10 3/uL      Abs Eos Automated 0.16 x10 3/uL      Absolute Baso Automated 0.02 x10 3/uL       Absolute Immature Granulocyte 0.04 x10 3/uL           Radiologic Studies  Radiology Results (24 Hour)     Procedure Component Value Units Date/Time    Chest 2 Views [[id number]] Collected:  07/28/14 1824    Order Status:  Completed Updated:  07/28/14 1849    Narrative:      CLINICAL HISTORY: Left sided chest pain.    FINDINGS: Comparison 06/14/2012. PA and lateral views of the chest were  performed. The cardiomediastinal silhouette and hilar regions are  normal. No effusions or focal infiltrates are visualized. The lungs are  clear. The pulmonary vascular pattern is normal.       Impression:       No acute cardiopulmonary disease.    Collene Schlichter, MD   07/28/2014 6:45 PM            Scribe and MD Attestations     I, Melida Gimenez, MD, personally performed the services documented. Max Servando Snare is scribing for me on [facility]. I reviewed and confirm the accuracy of the information in this medical record.    I, Max Ruge, am serving as a Neurosurgeon to document services personally performed by Melida Gimenez, MD, based on the provider's statements to me.     Rendering Provider: Melida Gimenez, MD    Monitors, EKG, Critical Care, and Splints     EKG (interpreted by ED physician): NSR, Rate at 89 bpm, No ectopy, Normal EKG.     Cardiac Monitor (interpreted by ED physician): NSR, Rate at 90 bpm, No ectopy.    Critical Care:   Splint check:      MDM and Clinical Notes     Notes:    1700: Reviewed of previous ER visits shows several others visits for shoulder pain. Pt was admitted to [facility] 1 year ago with vulvar cellulitis due to yeast.     1930: Pt is feeling better after toradol.    Consults:        Diagnosis and Disposition     Clinical Impression  1. Musculoskeletal chest pain    2. Hyperglycemia    3. Periumbilical abdominal pain        Disposition  ED Disposition     Discharge MADDI COLLAR discharge to home/self care.    Condition at disposition: Stable            Prescriptions       Discharge  Medication List as of 07/28/2014  7:26 PM                  Melida Gimenez, MD  07/29/14 [phone]

## 2014-07-28 NOTE — ED Notes (Signed)
Pt presents to ED by EMS stating "chest pain and abd pain since last night.  My husband thought it was acid reflux."  Pain 8/10.  Pt states abd pain worse today than last night.  Pt reports starting menstrual cycle last night.  Pt reports "white build-up or discharge that is not normally there when I wipe".  Pt calm and talkative in triage

## 2014-07-28 NOTE — ED Notes (Signed)
MD at bedside. 

## 2014-07-29 ENCOUNTER — Emergency Department: Payer: Medicare (Managed Care)

## 2014-07-29 ENCOUNTER — Emergency Department
Admission: EM | Admit: 2014-07-29 | Discharge: 2014-07-29 | Disposition: A | Discharge status: Home / Self Care | Payer: Medicare (Managed Care) | Attending: Emergency Medical Services | Admitting: Emergency Medical Services

## 2014-07-29 DIAGNOSIS — E119 Type 2 diabetes mellitus without complications: Secondary | ICD-10-CM | POA: Insufficient documentation

## 2014-07-29 DIAGNOSIS — M546 Pain in thoracic spine: Secondary | ICD-10-CM | POA: Insufficient documentation

## 2014-07-29 DIAGNOSIS — M545 Low back pain: Secondary | ICD-10-CM | POA: Insufficient documentation

## 2014-07-29 DIAGNOSIS — J45909 Unspecified asthma, uncomplicated: Secondary | ICD-10-CM | POA: Insufficient documentation

## 2014-07-29 DIAGNOSIS — M549 Dorsalgia, unspecified: Secondary | ICD-10-CM

## 2014-07-29 DIAGNOSIS — Z794 Long term (current) use of insulin: Secondary | ICD-10-CM | POA: Insufficient documentation

## 2014-07-29 DIAGNOSIS — E780 Pure hypercholesterolemia: Secondary | ICD-10-CM | POA: Insufficient documentation

## 2014-07-29 DIAGNOSIS — M542 Cervicalgia: Secondary | ICD-10-CM | POA: Insufficient documentation

## 2014-07-29 LAB — ECG 12-LEAD
Atrial Rate: 89 {beats}/min
P Axis: 45 degrees
P-R Interval: 154 ms
Q-T Interval: 368 ms
QRS Duration: 86 ms
QTC Calculation (Bezet): 447 ms
R Axis: 16 degrees
T Axis: 30 degrees
Ventricular Rate: 89 {beats}/min

## 2014-07-29 MED ORDER — IBUPROFEN 800 MG PO TABS
800.0000 mg | ORAL_TABLET | Freq: Once | ORAL | Status: AC
Start: 2014-07-29 — End: 2014-07-29
  Administered 2014-07-29: 800 mg via ORAL
  Filled 2014-07-29: qty 1
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
## 2014-07-29 NOTE — ED Notes (Signed)
[employer] [employer] at bedside

## 2014-07-29 NOTE — ED Notes (Signed)
Bed: A01A  Expected date: 07/29/14  Expected time: 10:04 AM  Means of arrival: [id number] - [place]  Comments:  [facility]

## 2014-07-29 NOTE — Discharge Instructions (Signed)
Dear [name]:    I appreciate your choosing the [facility] for your healthcare needs, and hope your visit today was EXCELLENT.    Instructions:  Please follow-up with Dr. Leanor Kail, your primary care provider, with any complaints.    Return to the Emergency Department for any worsening symptoms or concerns.    Below is some information that our patients often find helpful.    We wish you good health and please do not hesitate to contact us if we can ever be of any assistance.    Sincerely,  Thad Ranger, Tiffany Kocher, MD  [facility]    ________________________________________________________________    If you do not continue to improve or your condition worsens, please contact your doctor or return immediately to the Emergency Department.    Thank you for choosing [facility] for your emergency care needs.  We strive to provide EXCELLENT care to you and your family.      DOCTOR REFERRALS  Call [phone] if you need any further referrals and we can help you find a primary care doctor or specialist.  Also, available online at:  [email or web address]    YOUR CONTACT INFORMATION  Before leaving please check with registration to make sure we have an up-to-date contact number.  You can call registration at [phone] to update your information.  For questions about your hospital bill, please call [phone].  For questions about your Emergency Dept Physician bill please call [phone].      FREE HEALTH SERVICES  If you need help with health or social services, please call 2-1-1 for a free referral to resources in your area.  2-1-1 is a free service connecting people with information on health insurance, free clinics, pregnancy, mental health, dental care, food assistance, housing, and substance abuse counseling.  Also, available online at:  http://[email or web address].org    MEDICAL RECORDS AND TESTS  Certain laboratory  test results do not come back the same day, for example urine cultures.   We will contact you if other important findings are noted.  Radiology films are often reviewed again to ensure accuracy.  If there is any discrepancy, we will notify you.      Please call [phone] to pick up a complimentary CD of any radiology studies performed.  If you or your doctor would like to request a copy of your medical records, please call [phone].      ORTHOPEDIC INJURY   Please know that significant injuries can exist even when an initial x-ray is read as normal or negative.  This can occur because some fractures (broken bones) are not initially visible on x-rays.  For this reason, close outpatient follow-up with your primary care doctor or bone specialist (orthopedist) is required.    MEDICATIONS AND FOLLOWUP  Please be aware that some prescription medications can cause drowsiness.  Use caution when driving or operating machinery.    The examination and treatment you have received in our Emergency Department is provided on an emergency basis, and is not intended to be a substitute for your primary care physician.  It is important that your doctor checks you again and that you report any new or remaining problems at that time.      24 HOUR PHARMACIES  [facility] - [address], [place], [place] [address] (1.4 miles, 7 minutes)  [facility] - [address], [place], [place] [address] (6.5 miles, 13 minutes)  Handout with directions available on request
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
## 2014-08-11 ENCOUNTER — Emergency Department: Payer: Medicare (Managed Care)

## 2014-08-11 ENCOUNTER — Emergency Department
Admission: EM | Admit: 2014-08-11 | Discharge: 2014-08-11 | Disposition: A | Discharge status: Home / Self Care | Payer: Medicare (Managed Care) | Attending: Emergency Medicine | Admitting: Emergency Medicine

## 2014-08-11 DIAGNOSIS — Z794 Long term (current) use of insulin: Secondary | ICD-10-CM | POA: Insufficient documentation

## 2014-08-11 DIAGNOSIS — E780 Pure hypercholesterolemia: Secondary | ICD-10-CM | POA: Insufficient documentation

## 2014-08-11 DIAGNOSIS — R21 Rash and other nonspecific skin eruption: Secondary | ICD-10-CM | POA: Insufficient documentation

## 2014-08-11 DIAGNOSIS — J45909 Unspecified asthma, uncomplicated: Secondary | ICD-10-CM | POA: Insufficient documentation

## 2014-08-11 DIAGNOSIS — M79604 Pain in right leg: Secondary | ICD-10-CM

## 2014-08-11 DIAGNOSIS — E119 Type 2 diabetes mellitus without complications: Secondary | ICD-10-CM | POA: Insufficient documentation

## 2014-08-11 DIAGNOSIS — M79606 Pain in leg, unspecified: Secondary | ICD-10-CM | POA: Insufficient documentation

## 2014-08-11 MED ORDER — HYDROMORPHONE HCL 2 MG PO TABS
2.0000 mg | ORAL_TABLET | Freq: Four times a day (QID) | ORAL | Status: DC | PRN
Start: 2014-08-11 — End: 2015-03-30

## 2014-08-11 MED ORDER — DIPHENHYDRAMINE HCL 25 MG PO CAPS
50.0000 mg | ORAL_CAPSULE | Freq: Once | ORAL | Status: AC
Start: 2014-08-11 — End: 2014-08-11
  Administered 2014-08-11: 50 mg via ORAL
  Filled 2014-08-11: qty 2

## 2014-08-11 NOTE — ED Notes (Signed)
See quick triage note.
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
## 2014-10-26 ENCOUNTER — Emergency Department: Payer: Medicare (Managed Care)

## 2014-10-26 ENCOUNTER — Emergency Department
Admission: EM | Admit: 2014-10-26 | Discharge: 2014-10-26 | Disposition: A | Discharge status: Home / Self Care | Payer: Medicare (Managed Care) | Attending: Nurse Practitioner | Admitting: Nurse Practitioner

## 2014-10-26 DIAGNOSIS — S59902A Unspecified injury of left elbow, initial encounter: Secondary | ICD-10-CM | POA: Insufficient documentation

## 2014-10-26 DIAGNOSIS — E119 Type 2 diabetes mellitus without complications: Secondary | ICD-10-CM | POA: Insufficient documentation

## 2014-10-26 DIAGNOSIS — E780 Pure hypercholesterolemia: Secondary | ICD-10-CM | POA: Insufficient documentation

## 2014-10-26 DIAGNOSIS — J45909 Unspecified asthma, uncomplicated: Secondary | ICD-10-CM | POA: Insufficient documentation

## 2014-10-26 DIAGNOSIS — Z794 Long term (current) use of insulin: Secondary | ICD-10-CM | POA: Insufficient documentation

## 2014-10-26 MED ORDER — ACETAMINOPHEN 500 MG PO TABS
1000.0000 mg | ORAL_TABLET | Freq: Once | ORAL | Status: AC
Start: 2014-10-26 — End: 2014-10-26
  Administered 2014-10-26: 1000 mg via ORAL
  Filled 2014-10-26: qty 2
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
## 2014-10-26 NOTE — ED Notes (Signed)
Patient seemed upset and raised her voice as she was disgruntled about having to receive a sling instead of a cast. She refused to allow me to apply the sling stating that she is " 39 year olds and knows how to put on a sling."
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
## 2014-11-06 ENCOUNTER — Emergency Department: Payer: Medicare (Managed Care)

## 2014-11-06 ENCOUNTER — Emergency Department
Admission: EM | Admit: 2014-11-06 | Discharge: 2014-11-06 | Disposition: A | Discharge status: Home / Self Care | Payer: Medicare (Managed Care) | Attending: Emergency Medicine | Admitting: Emergency Medicine

## 2014-11-06 DIAGNOSIS — W171XXA Fall into storm drain or manhole, initial encounter: Secondary | ICD-10-CM | POA: Insufficient documentation

## 2014-11-06 DIAGNOSIS — S8010XA Contusion of unspecified lower leg, initial encounter: Secondary | ICD-10-CM | POA: Insufficient documentation

## 2014-11-06 DIAGNOSIS — Z862 Personal history of diseases of the blood and blood-forming organs and certain disorders involving the immune mechanism: Secondary | ICD-10-CM | POA: Insufficient documentation

## 2014-11-06 DIAGNOSIS — J45909 Unspecified asthma, uncomplicated: Secondary | ICD-10-CM | POA: Insufficient documentation

## 2014-11-06 DIAGNOSIS — E119 Type 2 diabetes mellitus without complications: Secondary | ICD-10-CM | POA: Insufficient documentation

## 2014-11-06 DIAGNOSIS — W19XXXA Unspecified fall, initial encounter: Secondary | ICD-10-CM

## 2014-11-06 DIAGNOSIS — Z794 Long term (current) use of insulin: Secondary | ICD-10-CM | POA: Insufficient documentation

## 2014-11-06 MED ORDER — TRAMADOL HCL 50 MG PO TABS
50.0000 mg | ORAL_TABLET | Freq: Four times a day (QID) | ORAL | Status: DC | PRN
Start: 2014-11-06 — End: 2015-03-30

## 2014-11-06 MED ORDER — IBUPROFEN 600 MG PO TABS
600.0000 mg | ORAL_TABLET | Freq: Once | ORAL | Status: AC
Start: 2014-11-06 — End: 2014-11-06
  Administered 2014-11-06: 600 mg via ORAL
  Filled 2014-11-06: qty 1

## 2014-11-06 MED ORDER — TRAMADOL HCL 50 MG PO TABS
50.0000 mg | ORAL_TABLET | Freq: Once | ORAL | Status: AC
Start: 2014-11-06 — End: 2014-11-06
  Administered 2014-11-06: 50 mg via ORAL
  Filled 2014-11-06: qty 1

## 2014-11-06 MED ORDER — IBUPROFEN 600 MG PO TABS
600.0000 mg | ORAL_TABLET | Freq: Four times a day (QID) | ORAL | Status: DC | PRN
Start: 2014-11-06 — End: 2014-11-07

## 2014-11-06 NOTE — ED Provider Notes (Signed)
Physician/Midlevel provider first contact with patient: 11/06/14 [facility]           EMERGENCY DEPARTMENT NOTE    Physician/Midlevel provider first contact with patient: 11/06/14 0038         HISTORY OF PRESENT ILLNESS   Historian: Patient  Translator Used: None    39 y.o. female reports she was walking and fell into a manhole. Both legs went in and she reports she had to pull herself out. She reports difficulty walking since.     1. Location of symptoms: LE  2. Onset of symptoms: Today   3. What was patient doing when symptoms started (Context): Walking  4. Severity: Moderate  5. Timing: Constant  6. Activities that worsen symptoms: Walking/Movement  7. Activities that improve symptoms: None  8. Quality: Ache  9. Radiation of symptoms: None  10. Associated signs and Symptoms: None  11. Are symptoms worsening? Y    Nursing (triage) note reviewed for the following pertinent information:           MEDICAL HISTORY     Past Medical History:  Past Medical History   Diagnosis Date   . Diabetes mellitus without complication    . Asthma without status asthmaticus    . Anemia    . Depression    . Anemia    . Panic attack    . Seasonal allergies    . High cholesterol        Past Surgical History:  Past Surgical History   Procedure Laterality Date   . Cesarean section     . Cholecystectomy     . Tonsillectomy     . Hernia repair         Social History:  History     Social History   . Marital Status: Single     Spouse Name: N/A   . Number of Children: N/A   . Years of Education: N/A     Occupational History   . Not on file.     Social History Main Topics   . Smoking status: Never Smoker    . Smokeless tobacco: Not on file   . Alcohol Use: No   . Drug Use: No   . Sexual Activity: Not on file     Other Topics Concern   . Not on file     Social History Narrative       Family History:  Family History   Problem Relation Age of Onset   . Diabetes Mother    . Diabetes Sister    . Diabetes Maternal Aunt    . Hypertension Paternal  Grandmother    . No known problems Daughter    . No known problems Son    . No known problems Daughter    . No known problems Son        Outpatient Medication:  Discharge Medication List as of 11/06/2014  2:21 AM      CONTINUE these medications which have NOT CHANGED    Details   ATORVASTATIN CALCIUM PO Take by mouth., Until Discontinued, Historical Med      clotrimazole-betamethasone (LOTRISONE) cream Apply topically 2 (two) times daily., Starting 08/06/2013, Until Discontinued, Print      GABAPENTIN PO Take by mouth., Until Discontinued, Historical Med      HYDROmorphone (DILAUDID) 2 MG tablet Take 1 tablet (2 mg total) by mouth every 6 (six) hours as needed for Pain (As needed for pain)., Starting 08/11/2014, Until Discontinued, Print  Insulin Glargine (LANTUS SC) Inject into the skin., Until Discontinued, Historical Med      Liraglutide (VICTOZA SC) Inject into the skin., Until Discontinued, Historical Med      lisinopril (PRINIVIL,ZESTRIL) 20 MG tablet Take 20 mg by mouth daily., Until Discontinued, Historical Med      metFORMIN (GLUCOPHAGE) 500 MG tablet Take 2 tablets (1,000 mg total) by mouth 2 (two) times daily with meals., Starting 07/02/2013, Until Discontinued, Print      naproxen (NAPROSYN) 500 MG tablet Take 1 tablet (500 mg total) by mouth 2 (two) times daily with meals., Starting 06/01/2014, Until Discontinued, Print      nystatin (MYCOSTATIN) cream Apply topically 2 (two) times daily., Starting 08/06/2013, Until Discontinued, Print      VITAMIN D, ERGOCALCIFEROL, PO Take by mouth., Until Discontinued, Historical Med             Allergies:  Allergies   Allergen Reactions   . Latex    . Morphine    . Percocet [Oxycodone-Acetaminophen]      Unknown reaction     . Tape          REVIEW OF SYSTEMS   Review of Systems   Musculoskeletal: Positive for joint pain.   All other systems reviewed and are negative.        PHYSICAL EXAM     Filed Vitals:    11/06/14 0017 11/06/14 0035 11/06/14 0240   BP: 174/93 140/95  137/79   Pulse: 95 64 95   Temp: 97.8 F (36.6 C)  97.8 F (36.6 C)   TempSrc:   Oral   Resp: 18 20 16    Height: 5\' 11"  (1.803 m)     Weight: 127.007 kg     SpO2: 98% 98% 97%         Nursing note and vitals reviewed.  Constitutional:  Well developed, well nourished. Awake & Oriented x3. Obese female Appears in no distress  Head:  Atraumatic. Normocephalic.    Eyes:  PERRL. EOMI. Conjunctivae are not pale.  ENT:  Mucous membranes are moist and intact. Oropharynx is clear and symmetric.  Patent airway.  Neck:  Supple. Full ROM.    Cardiovascular:  Regular rate. Regular rhythm. No murmurs, rubs, or gallops.  Pulmonary/Chest:  No evidence of respiratory distress. Clear to auscultation bilaterally.  No wheezing, rales or rhonchi.   Abdominal:  Soft and non-distended. There is no tenderness. No rebound, guarding, or rigidity.  Back:  Full ROM. Nontender midline  Extremities:  No edema. No cyanosis. No clubbing. Full range of motion in all extremities. LLE with ttp L knee no deformity or instability with ligament testing, moderate TTP L mid tib fib region TTP along L great toe. R knee ttp no deformity/instability. No LE deformity. NV intact   Skin:  Skin is warm and dry.  No diaphoresis. No rash.   Neurological:  Alert, awake, and appropriate. Normal speech. Motor normal.  Psychiatric:  Good eye contact. Normal interaction, affect, and behavior.            MEDICAL DECISION MAKING       Patient reports b/l LE pain s/p fall into a manhole. Most pain along the L tib/fib region. No LE deformity  Imaging ordered by nurse at triage given pts area of pain  Imaging negative for acute findings  Patient advised to follow with her pmd     DISCUSSION      Vital Signs: Reviewed the patient?s vital signs.  Nursing Notes: Reviewed and utilized available nursing notes.  Medical Records Reviewed: Reviewed available past medical records.  Counseling: The emergency provider has spoken with the patient and discussed today?s findings, in  addition to providing specific details for the plan of care.  Questions are answered and there is agreement with the plan.    IMAGING STUDIES    The following imaging studies were independently interpreted by the Emergency Medicine Physician.  For full imaging study results please see chart.    XR B/l LE: neg for acute fracture, dislocation as read by me. Chronic changes present R tib fib region unchanged from prior study     CARDIAC STUDIES     The following cardiac studies were independently interpreted by the Emergency Medicine Physician. For full cardiac study results please see chart       PULSE OXIMETRY    Oxygen Saturation by Pulse Oximetry: 97%  Interventions:   Interpretation: Normal    EMERGENCY DEPT. MEDICATIONS      ED Medication Orders     Start Ordered     Status Ordering Provider    11/06/14 0240 11/06/14 0239  traMADol (ULTRAM) tablet 50 mg   Once     Route: Oral  Ordered Dose: 50 mg     Last MAR action:  Given Raliegh Ip    11/06/14 0222 11/06/14 0221  ibuprofen (ADVIL,MOTRIN) tablet 600 mg   Once     Route: Oral  Ordered Dose: 600 mg     Last MAR action:  Given Betsie Peckman YVONNE          LABORATORY RESULTS    Ordered and independently interpreted AVAILABLE laboratory tests. Please see results section in chart for full details.  Results for orders placed or performed during the hospital encounter of 07/28/14   CBC with differential   Result Value Ref Range    WBC 9.71 3.50 - 10.80 x10 3/uL    Hgb 13.1 12.0 - 16.0 g/dL    Hematocrit 16.1 09.6 - 47.0 %    Platelets 330 140 - 400 x10 3/uL    RBC 4.79 4.20 - 5.40 x10 6/uL    MCV 77.2 (L) 80.0 - 100.0 fL    MCH 27.3 (L) 28.0 - 32.0 pg    MCHC 35.4 32.0 - 36.0 g/dL    RDW 14 12 - 15 %    MPV 10.1 9.4 - 12.3 fL    Neutrophils 69 None %    Lymphocytes Automated 24 None %    Monocytes 5 None %    Eosinophils Automated 2 None %    Basophils Automated 0 None %    Immature Granulocyte 0 None %    Nucleated RBC 0 0 - 1 /100 WBC    Neutrophils  Absolute 6.68 1.80 - 8.10 x10 3/uL    Abs Lymph Automated 2.35 0.50 - 4.40 x10 3/uL    Abs Mono Automated 0.50 0.00 - 1.20 x10 3/uL    Abs Eos Automated 0.16 0.00 - 0.70 x10 3/uL    Absolute Baso Automated 0.02 0.00 - 0.20 x10 3/uL    Absolute Immature Granulocyte 0.04 0 x10 3/uL   Basic Metabolic Panel   Result Value Ref Range    Glucose 220 (H) 70 - 100 mg/dL    BUN 9 7 - 19 mg/dL    Creatinine 0.7 0.6 - 1.0 mg/dL    Calcium 9.3 8.5 - 04.5 mg/dL    Sodium 409 (L) 811 - 145  mEq/L    Potassium 3.9 3.5 - 5.1 mEq/L    Chloride 101 100 - 111 mEq/L    CO2 21 (L) 22 - 29 mEq/L    Anion Gap 13.0 5.0 - 15.0   Troponin I   Result Value Ref Range    Troponin I <0.01 0.00 - 0.09 ng/mL   hCG, Qualitative (Pos/Neg)   Result Value Ref Range    Hcg Qualitative Negative Negative   GFR   Result Value Ref Range    EGFR >60.0    ECG 12 Lead   Result Value Ref Range    Ventricular Rate 89 BPM    Atrial Rate 89 BPM    P-R Interval 154 ms    QRS Duration 86 ms    Q-T Interval 368 ms    QTC Calculation (Bezet) 447 ms    P Axis 45 degrees    R Axis 16 degrees    T Axis 30 degrees       CONSULTATIONS        CRITICAL CARE        ATTESTATIONS        Physician Attestation: I, Dr Marnee Spring DO, have been the primary provider for Dorothy [name] during this Emergency Dept visit and have reviewed the chart for accuracy and agree with its content.       DIAGNOSIS      Diagnosis:  Final diagnoses:   Fall, initial encounter   Contusion of lower leg, unspecified laterality, initial encounter       Disposition:  ED Disposition     Discharge Dorothy [name] discharge to home/self care.    Condition at disposition: Stable            Prescriptions:  Discharge Medication List as of 11/06/2014  2:21 AM      START taking these medications    Details   ibuprofen (ADVIL,MOTRIN) 600 MG tablet Take 1 tablet (600 mg total) by mouth every 6 (six) hours as needed for Pain or Fever., Starting 11/06/2014, Until Discontinued, Print         CONTINUE these  medications which have CHANGED    Details   traMADol (ULTRAM) 50 MG tablet Take 1 tablet (50 mg total) by mouth every 6 (six) hours as needed for Pain. Do not drive or operate machinery while taking this medication, Starting 11/06/2014, Until Discontinued, Print         CONTINUE these medications which have NOT CHANGED    Details   ATORVASTATIN CALCIUM PO Take by mouth., Until Discontinued, Historical Med      clotrimazole-betamethasone (LOTRISONE) cream Apply topically 2 (two) times daily., Starting 08/06/2013, Until Discontinued, Print      GABAPENTIN PO Take by mouth., Until Discontinued, Historical Med      HYDROmorphone (DILAUDID) 2 MG tablet Take 1 tablet (2 mg total) by mouth every 6 (six) hours as needed for Pain (As needed for pain)., Starting 08/11/2014, Until Discontinued, Print      Insulin Glargine (LANTUS SC) Inject into the skin., Until Discontinued, Historical Med      Liraglutide (VICTOZA SC) Inject into the skin., Until Discontinued, Historical Med      lisinopril (PRINIVIL,ZESTRIL) 20 MG tablet Take 20 mg by mouth daily., Until Discontinued, Historical Med      metFORMIN (GLUCOPHAGE) 500 MG tablet Take 2 tablets (1,000 mg total) by mouth 2 (two) times daily with meals., Starting 07/02/2013, Until Discontinued, Print      naproxen (NAPROSYN)  500 MG tablet Take 1 tablet (500 mg total) by mouth 2 (two) times daily with meals., Starting 06/01/2014, Until Discontinued, Print      nystatin (MYCOSTATIN) cream Apply topically 2 (two) times daily., Starting 08/06/2013, Until Discontinued, Print      VITAMIN D, ERGOCALCIFEROL, PO Take by mouth., Until Discontinued, Historical Med               Raliegh Ip, DO  11/06/14 2013

## 2014-11-06 NOTE — Discharge Instructions (Signed)
Contusion    You have been diagnosed with a contusion.    A contusion is a bruise. A contusion occurs when something strikes or hits the body. This breaks small blood vessels called capillaries. When the capillaries break, blood leaks out. This makes the skin look red, purple, blue, or black. The injured area may hurt for a few days. If you take a blood thinner like warfarin (Coumadin) the bruising may be worse.    Apply ice to the bruise. Avoid using the injured body part.    Apply ice to help with pain and swelling. Put some ice cubes in a re-sealable plastic bag (like Ziploc). Add some water. Seal the bag. Put a thin washcloth between the bag and the skin. Apply the ice bag for at least 20 minutes. Do this at least 4 times per day. It's okay to apply ice longer or more often. NEVER APPLY ICE DIRECTLY TO THE SKIN. Always keep a washcloth between the ice pack and your body.    YOU SHOULD SEEK MEDICAL ATTENTION IMMEDIATELY, EITHER HERE OR AT THE NEAREST EMERGENCY DEPARTMENT, IF ANY OF THE FOLLOWING OCCURS:   Your pain or swelling gets much worse.   You develop new numbness or tingling in or below the affected area.   Your foot or hand looks cold or pale. This could mean there is a problem with circulation (blood supply).

## 2014-11-06 NOTE — ED Notes (Signed)
Refused to give urine for test said will sign waiver.

## 2014-11-06 NOTE — ED Notes (Signed)
Pt c/o of fall into a man hole injuring both legs from the ankle to the knees, pt c/o of small bruise and scrap to the left arm,

## 2014-11-07 ENCOUNTER — Emergency Department: Payer: Medicare (Managed Care)

## 2014-11-07 ENCOUNTER — Emergency Department
Admission: EM | Admit: 2014-11-07 | Discharge: 2014-11-07 | Disposition: A | Discharge status: Home / Self Care | Payer: Medicare (Managed Care) | Attending: Emergency Medicine | Admitting: Emergency Medicine

## 2014-11-07 DIAGNOSIS — E669 Obesity, unspecified: Secondary | ICD-10-CM | POA: Insufficient documentation

## 2014-11-07 DIAGNOSIS — S93409A Sprain of unspecified ligament of unspecified ankle, initial encounter: Secondary | ICD-10-CM | POA: Insufficient documentation

## 2014-11-07 DIAGNOSIS — M79605 Pain in left leg: Secondary | ICD-10-CM

## 2014-11-07 DIAGNOSIS — W19XXXA Unspecified fall, initial encounter: Secondary | ICD-10-CM | POA: Insufficient documentation

## 2014-11-07 DIAGNOSIS — S8010XA Contusion of unspecified lower leg, initial encounter: Secondary | ICD-10-CM

## 2014-11-07 DIAGNOSIS — R739 Hyperglycemia, unspecified: Secondary | ICD-10-CM

## 2014-11-07 DIAGNOSIS — E138 Other specified diabetes mellitus with unspecified complications: Secondary | ICD-10-CM

## 2014-11-07 DIAGNOSIS — Z794 Long term (current) use of insulin: Secondary | ICD-10-CM | POA: Insufficient documentation

## 2014-11-07 DIAGNOSIS — S8012XA Contusion of left lower leg, initial encounter: Secondary | ICD-10-CM | POA: Insufficient documentation

## 2014-11-07 DIAGNOSIS — Z91040 Latex allergy status: Secondary | ICD-10-CM | POA: Insufficient documentation

## 2014-11-07 DIAGNOSIS — S8011XA Contusion of right lower leg, initial encounter: Secondary | ICD-10-CM | POA: Insufficient documentation

## 2014-11-07 DIAGNOSIS — E780 Pure hypercholesterolemia: Secondary | ICD-10-CM | POA: Insufficient documentation

## 2014-11-07 DIAGNOSIS — E1165 Type 2 diabetes mellitus with hyperglycemia: Secondary | ICD-10-CM | POA: Insufficient documentation

## 2014-11-07 DIAGNOSIS — M79604 Pain in right leg: Secondary | ICD-10-CM

## 2014-11-07 DIAGNOSIS — J45909 Unspecified asthma, uncomplicated: Secondary | ICD-10-CM | POA: Insufficient documentation

## 2014-11-07 LAB — CBC AND DIFFERENTIAL
Basophils Absolute Automated: 0.02 10*3/uL (ref 0.00–0.20)
Basophils Automated: 0 %
Eosinophils Absolute Automated: 0.14 10*3/uL (ref 0.00–0.70)
Eosinophils Automated: 2 %
Hematocrit: 34.4 % — ABNORMAL LOW (ref 37.0–47.0)
Hgb: 12.1 g/dL (ref 12.0–16.0)
Lymphocytes Absolute Automated: 2 10*3/uL (ref 0.50–4.40)
Lymphocytes Automated: 26 %
MCH: 27.3 pg — ABNORMAL LOW (ref 28.0–32.0)
MCHC: 35.2 g/dL (ref 32.0–36.0)
MCV: 77.5 fL — ABNORMAL LOW (ref 80.0–100.0)
MPV: 9.9 fL (ref 9.4–12.3)
Monocytes Absolute Automated: 0.49 10*3/uL (ref 0.00–1.20)
Monocytes: 6 %
Neutrophils Absolute: 5.15 10*3/uL (ref 1.80–8.10)
Neutrophils: 66 %
Platelets: 385 10*3/uL (ref 140–400)
RBC: 4.44 10*6/uL (ref 4.20–5.40)
RDW: 14 % (ref 12–15)
WBC: 7.8 10*3/uL (ref 3.50–10.80)

## 2014-11-07 LAB — COMPREHENSIVE METABOLIC PANEL
ALT: 9 U/L (ref 0–55)
AST (SGOT): 18 U/L (ref 5–34)
Albumin/Globulin Ratio: 0.9 (ref 0.9–2.2)
Albumin: 3.2 g/dL — ABNORMAL LOW (ref 3.5–5.0)
Alkaline Phosphatase: 92 U/L (ref 37–106)
Anion Gap: 13 (ref 5.0–15.0)
BUN: 12 mg/dL (ref 7.0–19.0)
Bilirubin, Total: 0.2 mg/dL (ref 0.2–1.2)
CO2: 20 mEq/L — ABNORMAL LOW (ref 22–29)
Calcium: 9.2 mg/dL (ref 8.5–10.5)
Chloride: 102 mEq/L (ref 100–111)
Creatinine: 0.7 mg/dL (ref 0.6–1.0)
Globulin: 3.7 g/dL — ABNORMAL HIGH (ref 2.0–3.6)
Glucose: 339 mg/dL — ABNORMAL HIGH (ref 70–100)
Potassium: 4.7 mEq/L (ref 3.5–5.1)
Protein, Total: 6.9 g/dL (ref 6.0–8.3)
Sodium: 135 mEq/L — ABNORMAL LOW (ref 136–145)

## 2014-11-07 LAB — HCG, SERUM, QUALITATIVE: Hcg Qualitative: NEGATIVE

## 2014-11-07 LAB — GFR: EGFR: >60

## 2014-11-07 MED ORDER — DIFLUNISAL 500 MG PO TABS
500.0000 mg | ORAL_TABLET | Freq: Two times a day (BID) | ORAL | Status: DC | PRN
Start: 2014-11-07 — End: 2015-03-30

## 2014-11-07 MED ORDER — SODIUM CHLORIDE 0.9 % IV BOLUS
1000.0000 mL | Freq: Once | INTRAVENOUS | Status: AC
Start: 2014-11-07 — End: 2014-11-07
  Administered 2014-11-07: 1000 mL via INTRAVENOUS

## 2014-11-07 NOTE — ED Provider Notes (Signed)
Physician/Midlevel provider first contact with patient: 11/07/14 1417         History     Chief Complaint   Patient presents with   . Leg Pain     HPI Comments:     Chief Complaint: Pain  Onset/Duration: 2 days  Quality/Location: Legs  Severity: Moderate  Aggravating Factors: Walking  Alleviating Factors: Not walking  Associated Symptoms/ Additional Comments: Fall    Patient relates approximately one week ago she fell on a manhole cover and injured both legs.  She relates she recently had x-rays, but they still hurt.  She denies other injury concerns, including numbness or weakness in legs.  EMS blood glucose equals 401.    Patient is a 39 y.o. female presenting with leg pain. The history is provided by the patient and the EMS personnel.   Leg Pain  Associated symptoms: no back pain, no fatigue and no fever             Past Medical History   Diagnosis Date   . Diabetes mellitus without complication    . Asthma without status asthmaticus    . Anemia    . Depression    . Anemia    . Panic attack    . Seasonal allergies    . High cholesterol        Past Surgical History   Procedure Laterality Date   . Cesarean section     . Cholecystectomy     . Tonsillectomy     . Hernia repair         Family History   Problem Relation Age of Onset   . Diabetes Mother    . Diabetes Sister    . Diabetes Maternal Aunt    . Hypertension Paternal Grandmother    . No known problems Daughter    . No known problems Son    . No known problems Daughter    . No known problems Son        Social  History   Substance Use Topics   . Smoking status: Never Smoker    . Smokeless tobacco: Not on file   . Alcohol Use: No       .     Allergies   Allergen Reactions   . Latex    . Morphine    . Percocet [Oxycodone-Acetaminophen]      Unknown reaction     . Tape        Home Medications     Last Medication Reconciliation Action:  Complete Wall, Rudean Haskell, RN 11/07/2014  2:21 PM                  ATORVASTATIN CALCIUM PO     Take by mouth.      clotrimazole-betamethasone (LOTRISONE) cream     Apply topically 2 (two) times daily.     GABAPENTIN PO     Take by mouth.     HYDROmorphone (DILAUDID) 2 MG tablet     Take 1 tablet (2 mg total) by mouth every 6 (six) hours as needed for Pain (As needed for pain).     Insulin Glargine (LANTUS SC)     Inject into the skin.     Liraglutide (VICTOZA SC)     Inject into the skin.     lisinopril (PRINIVIL,ZESTRIL) 20 MG tablet     Take 20 mg by mouth daily.     metFORMIN (GLUCOPHAGE) 500 MG tablet  Take 2 tablets (1,000 mg total) by mouth 2 (two) times daily with meals.     nystatin (MYCOSTATIN) cream     Apply topically 2 (two) times daily.     traMADol (ULTRAM) 50 MG tablet     Take 1 tablet (50 mg total) by mouth every 6 (six) hours as needed for Pain. Do not drive or operate machinery while taking this medication     VITAMIN D, ERGOCALCIFEROL, PO     Take by mouth.                            Ongoing Comment    Katherene Ponto RN    09/13/2012  4:37 PM    Unknown medications           Review of Systems   Constitutional: Negative for fever and fatigue.   HENT: Negative for congestion and sore throat.    Eyes: Negative for redness and visual disturbance.   Respiratory: Negative for cough and shortness of breath.    Cardiovascular: Positive for leg swelling. Negative for chest pain and palpitations.   Gastrointestinal: Negative for nausea, vomiting, abdominal pain and diarrhea.   Genitourinary: Negative for dysuria and difficulty urinating.   Musculoskeletal: Positive for joint swelling, arthralgias and gait problem. Negative for back pain.        States she cannot walk   Skin: Negative for rash.   Neurological: Negative for dizziness, weakness, numbness and headaches.   Psychiatric/Behavioral: Negative for suicidal ideas. The patient is not nervous/anxious.        Physical Exam    BP: 140/74 mmHg, Heart Rate: 97, Temp: 97.9 F (36.6 C), Resp Rate: 20, SpO2: 99 %    Physical Exam   Constitutional: She appears  well-developed. No distress.   Well appearing, no apparent distress, on telephone speaker phone during the entire ED stay yelling back and forth with the other person   HENT:   Head: Normocephalic and atraumatic.   Eyes: Conjunctivae and EOM are normal. Pupils are equal, round, and reactive to light.   Neck: Normal range of motion. No spinous process tenderness and no muscular tenderness present.   Cardiovascular: Normal rate and regular rhythm.    Pulmonary/Chest: Effort normal and breath sounds normal. No respiratory distress.   Abdominal: Soft. There is no tenderness.   Obese   Musculoskeletal: Normal range of motion. She exhibits no edema.        Right shoulder: She exhibits no deformity.        Legs:  Lymphadenopathy:     She has no cervical adenopathy.   Neurological: She is alert. She has normal strength. No cranial nerve deficit or sensory deficit. Coordination normal. GCS eye subscore is 4. GCS verbal subscore is 5. GCS motor subscore is 6.   Skin: Skin is warm. No rash noted.   Psychiatric: Her speech is normal. Her affect is angry.         MDM and ED Course     ED Medication Orders     Start Ordered     Status Ordering Provider    11/07/14 1453 11/07/14 1452  sodium chloride 0.9 % bolus 1,000 mL   Once     Route: Intravenous  Ordered Dose: 1,000 mL     Last MAR action:  Stopped Areebah Meinders JEFFREY             MDM  Number of Diagnoses or Management  Options  Contusion of lower leg, unspecified laterality, initial encounter: minor  Hyperglycemia: minor  Other specified diabetes mellitus with unspecified complications: minor  Pain in both lower extremities: established and improving  Sprain of ankle, unspecified laterality, unspecified ligament, initial encounter: established and improving  Diagnosis management comments: Reviewed patient's recent x-rays from 2 days prior, without acute changes.  Repeat imaging not indicated.  Patient is difficult to obtain history from, as she is not forthcoming or  truthful with history.  She adamantly denies that she was at [facility] on July 3, which she clearly was according to records and her signature matches all others that day and additionally I was able to talk to the same nurse practitioner that saw her that day and described her distinct characteristics exactly.  Upon discharge, patient ambulated out of the ED without difficulty and without the prior described inability to walk.       Amount and/or Complexity of Data Reviewed  Tests in the radiology section of CPT: ordered and reviewed  Obtain history from someone other than the patient: yes  Review and summarize past medical records: yes  Independent visualization of images, tracings, or specimens: yes    Risk of Complications, Morbidity, and/or Mortality  Presenting problems: high  Diagnostic procedures: high  Management options: high    Patient Progress  Patient progress: stable            Procedures    Clinical Impression & Disposition     Clinical Impression  Final diagnoses:   Pain in both lower extremities   Contusion of lower leg, unspecified laterality, initial encounter   Sprain of ankle, unspecified laterality, unspecified ligament, initial encounter   Other specified diabetes mellitus with unspecified complications   Hyperglycemia        ED Disposition     Discharge Allegra Grana discharge to home/self care.    Condition at disposition: Stable             Discharge Medication List as of 11/07/2014  3:48 PM      START taking these medications    Details   diflunisal (DOLOBID) 500 MG Tab tablet Take 1 tablet (500 mg total) by mouth 2 (two) times daily as needed (When necessary pain)., Starting 11/07/2014, Until Discontinued, Print                         Herma Ard, MD  11/09/14 2106

## 2014-11-07 NOTE — ED Notes (Signed)
Patient arrived to the ER c/o leg pain.  Pain is chronic.  Patient was seen at [facility] a couple days ago for the same.  Patient arrived wearing arm band from previous hospital encounter.  Ace wraps are on her ankles. Patient wearing hospital legs.

## 2014-11-07 NOTE — ED Notes (Signed)
When I went to draw patient's blood, she informed me that only her right hand had usable veins and pointed to a spot in the middle of said hand. She became agitated when I examined an area immediately adjacent to the one she had identified, and loudly complained about me to her friend on her cell phone. I told her that I was looking at a slightly different spot on her hand because there was a large amount of scar tissue, but she talked over me, telling her friend that "if the woman asks me where I can put her IV and I tell her it has to be exactly there." I ended up placing the IV exactly where she specified. As I was cleaning up, the glass door to her room was closed with the curtain open, because patient was talking loudly on her cell phone, and this also aggravated her. I left the room with her complaining vehemently about myself and the entire staff on her cell phone. Charge nurse and MD informed.

## 2014-11-07 NOTE — ED Notes (Signed)
Patient brought warm blankets and diet ginger ale for comfort.  Patient on her cell phone.  Appears very agitated.  Patient explains she fell in a man hole and hurt her legs.

## 2014-11-07 NOTE — Discharge Instructions (Signed)
Contusion     You have been diagnosed with a contusion.     A contusion is a bruise. A contusion occurs when something strikes or hits the body. This breaks small blood vessels called capillaries. When the capillaries break, blood leaks out. This makes the skin look red, purple, blue, or black. The injured area may hurt for a few days. If you take a blood thinner like warfarin (Coumadin®) the bruising may be worse.     Apply ice to the bruise. Avoid using the injured body part.     Apply ice to help with pain and swelling. Put some ice cubes in a re-sealable plastic bag (like Ziploc®). Add some water. Seal the bag. Put a thin washcloth between the bag and the skin. Apply the ice bag for at least 20 minutes. Do this at least 4 times per day. It’s okay to apply ice longer or more often. NEVER APPLY ICE DIRECTLY TO THE SKIN. Always keep a washcloth between the ice pack and your body.     YOU SHOULD SEEK MEDICAL ATTENTION IMMEDIATELY, EITHER HERE OR AT THE NEAREST EMERGENCY DEPARTMENT, IF ANY OF THE FOLLOWING OCCURS:  · Your pain or swelling gets much worse.  · You develop new numbness or tingling in or below the affected area.  · Your foot or hand looks cold or pale. This could mean there is a problem with circulation (blood supply).              Ankle Sprain     You have been diagnosed with an ankle sprain.     A sprain is a ligament injury, usually a tear or partial tear. Sprains can hurt as much as broken bones. Sprains can be classified by the degree of injury. A first-degree sprain is considered a minor tear. A second-degree sprain is a partial tear of the ligament. A third-degree sprain often involves a small fracture, or break, of the bone that the ligament is attached to.     Sprains are usually treated with pain medication and a splint to keep the joint from moving. You should Rest, Ice, Compress, and Elevate the injured ankle. Remember this as "RICE."  · REST: Limit the use of the injured body part.  · ICE:  By applying ice to the affected area, swelling and pain can be reduced. Place some ice cubes in a re-sealable (Ziploc®) bag and add some water. Put a thin washcloth between the bag and the skin. Apply the ice bag to the area for at least 20 minutes. Do this at least 4 times per day. Using the ice for longer times and more frequently is OK. NEVER APPLY ICE DIRECTLY TO THE SKIN.  · COMPRESS: Compression means to apply pressure around the injured area such as with a splint, cast or an ACE® bandage. Compression decreases swelling and improves comfort. Compression should be tight enough to relieve swelling but not so tight as to decrease circulation. Increasing pain, numbness, tingling, or change in skin color, are all signs of decreased circulation.  · ELEVATE: Elevate the injured part. For example, a sprained ankle can be placed up on a chair while sitting and propped up on pillows while lying down.     You have been given an ACE® BANDAGE. The bandage will compress the ankle. This increases comfort and reduces swelling. The ACE® bandage should fit snugly but not so tight as to decrease circulation (blood supply). Watch for swelling of the area outside the ACE® wrap. Check capillary refill (circulation) in your toenails. To   press on the nail. It should turn white. When you let go, the nail should return to pink in less than 2 seconds. If it doesn't, the bandage is too tight. Loosen the wrap if you need to.    Wear the ACE bandage:   For the next 1-2 weeks.    Ankle exercises are described below. Begin the exercises as soon as you are able. They will make the ankle stronger to prevent new injuries. Do the exercises 5 to 10 times each day.   Use your big toe to draw out the letters of the alphabet on the ground. Move your ankle as you make each letter.   Sit with your leg straight out in front of you. Wrap a towel around the ball of your foot (just below your toes) and pull back. Pull hard enough to stretch  the ankle. Don't pull hard enough to cause pain. Hold the stretch for 30 seconds.   Stand up. Rock onto the tiptoes of the injured foot and then return to the flat position. Repeat 10 times.   Rotate your ankle in a circle. Make 10 clockwise circles, then make 10 circles going the other way.    YOU SHOULD SEEK MEDICAL ATTENTION IMMEDIATELY, EITHER HERE OR AT THE NEAREST EMERGENCY DEPARTMENT, IF ANY OF THE FOLLOWING OCCURS:   Your pain gets much worse.   Your ankle or foot starts to tingle or it becomes numb.   Your foot is cold or pale. This might mean there is a problem with circulation (blood supply).            Leg Pain    You have been diagnosed with leg pain.    Many things can cause leg pain. Most of the causes are not dangerous and will get better on their own. These can include muscle cramps, bruises, strains, pinched nerves, and minor skin infections.    You had an exam by your doctor today. He or she decided that the cause of your leg pain does not seem to be serious or dangerous.    If your pain continues, you might need another exam or more tests. This is to find out why you have leg pain. The cause of your symptoms doesn t seem dangerous now. You don t need to stay in the hospital.    Though we don't believe your condition is dangerous right now, it is important to be careful. Sometimes a problem that seems mild can become serious later. This is why it is very important that you return here or go to the nearest Emergency Department if you are not improving or your symptoms are getting worse.    Some things you may try at home are:    Over-the-counter pain medications like that have ibuprofen (Advil/Motrin) or acetaminophen (Tylenol) in them. Follow the directions on the package.   Rest the leg as needed. As soon as you are able, start moving your leg again to keep it from getting stiff.    Return here or go to the nearest Emergency Department or follow-up with your doctor if you are  not getting better as expected.    Follow the instructions for any medication you are prescribed.     YOU SHOULD SEEK MEDICAL ATTENTION IMMEDIATELY, EITHER HERE OR AT THE NEAREST EMERGENCY DEPARTMENT, IF ANY OF THE FOLLOWING HAPPENS:   You have a fever (temperature higher than 100.73F or 38C).   Your pain does not go away or gets worse.  Your leg or joints (hip, knee, ankle, etc.) are red or swollen.   Your chest hurts or you get short of breath.   You have any other symptoms or concerns, or don t get better as expected.    If you can t follow up with your doctor, or if at any time you feel you need to be rechecked or seen again, come back here or go to the nearest emergency department.            Hyperglycemia    During the visit today, your blood sugar was found to be high.    The medical term for high blood sugar is hyperglycemia. This may be a one-time event, but it could mean you have diabetes. Untreated diabetes can lead to heart problems and kidney problems (including kidney failure). It can also lead to stroke and blindness. It is very important to follow up with your regular doctor to have your blood sugar re-checked.    Tell your regular doctor that your blood sugar was high today. Your doctor will want to recheck your blood. He or she may also want to order other lab tests. If the doctor finds that you have diabetes, you will need medicine and a special diet to control your blood sugar.    YOU SHOULD SEEK MEDICAL ATTENTION IMMEDIATELY, EITHER HERE OR AT THE NEAREST EMERGENCY DEPARTMENT, IF ANY OF THE FOLLOWING OCCURS:   Confusion or lethargy (very sleepy and hard to wake up).   Signs of dehydration. Signs include less urination (peeing), dry mouth, extreme fatigue (tiredness), lightheadedness or fainting.   Constant vomiting (throwing up).   Fever (temperature higher than 100.103F / 38C) or shaking chills.   Abdominal (belly) pain or vomiting (throwing up).            Diabetes Mellitus,  Type II    You have been diagnosed with type II diabetes.    Diabetes is a disease. It is when the body has trouble regulating the amount of sugar in the blood stream.    Type II diabetes is sometimes called "adult-onset diabetes". It is usually found in adults older than 40. However, type II diabetes is also seen in children and young adults.    Diabetes is a very serious disease. When not controlled, it can cause life-threatening problems. Untreated diabetes can lead to heart problems and kidney problems (including kidney failure). It can also lead to stroke and blindness.    Your insulin or pills will help keep your sugar under control. Also follow the diet prescribed by your doctor to keep your sugar under control. Untreated diabetes can cause heart problems, stroke or blindness. Therefore, it is very important for you to follow up with your regular doctor.    Check your blood sugar before every meal and before bedtime.    Use the sliding scale with regular Insulin as your doctor prescribed.    Insulin and other medicines for diabetes can make you feel lightheaded. You may sweat or even pass out. Always keep a snack food that contains sugar with you. If you begin to have these symptoms, eat the snack food to see if the symptoms get better. NEVER drive a car when feeling this way.    YOU SHOULD SEEK MEDICAL ATTENTION IMMEDIATELY, EITHER HERE OR AT THE NEAREST EMERGENCY DEPARTMENT, IF ANY OF THE FOLLOWING OCCURS:   Abdominal (belly) pain.   More than 3 vomiting (throwing up) episodes.   Blood sugar over 400 more than  3 times.   Fever (temperature higher than 100.4F / 38C) or shaking chills.   Unable to keep medicines down.   Any trouble swallowing or breathing.   Infection or rash on your skin.   Low blood sugar (less than 70) 3 or more times that does not get better after eating.

## 2014-11-07 NOTE — ED Notes (Signed)
Patient sitting on cell phone during discharge.  MD explained to patient that she would not receive IV narcotics for pain.  MD informed patient that she would receive a prescription for her pain.  She got on the phone and states "i need to call my insurance and tell them they don't need to pay for anything since I didn't get my pain meds.  I am gunna go off in here!"  IV removed, catheter intact, bleeding controlled.  Patient yelling on cell phone, ambulated to bathroom, gait steady.  rr even and unlabored, skin warm and dry.   Patient yelling while in the restroom, audible at nurses station.

## 2015-01-24 ENCOUNTER — Emergency Department: Payer: Medicare Other

## 2015-01-24 ENCOUNTER — Emergency Department
Admission: EM | Admit: 2015-01-24 | Discharge: 2015-01-24 | Disposition: A | Discharge status: Home / Self Care | Payer: Medicare Other | Attending: Emergency Medicine | Admitting: Emergency Medicine

## 2015-01-24 DIAGNOSIS — I10 Essential (primary) hypertension: Secondary | ICD-10-CM | POA: Insufficient documentation

## 2015-01-24 DIAGNOSIS — R55 Syncope and collapse: Secondary | ICD-10-CM | POA: Insufficient documentation

## 2015-01-24 DIAGNOSIS — Z7984 Long term (current) use of oral hypoglycemic drugs: Secondary | ICD-10-CM | POA: Insufficient documentation

## 2015-01-24 DIAGNOSIS — Z794 Long term (current) use of insulin: Secondary | ICD-10-CM | POA: Insufficient documentation

## 2015-01-24 DIAGNOSIS — E7800 Pure hypercholesterolemia, unspecified: Secondary | ICD-10-CM | POA: Insufficient documentation

## 2015-01-24 DIAGNOSIS — J45909 Unspecified asthma, uncomplicated: Secondary | ICD-10-CM | POA: Insufficient documentation

## 2015-01-24 DIAGNOSIS — E119 Type 2 diabetes mellitus without complications: Secondary | ICD-10-CM | POA: Insufficient documentation

## 2015-01-24 LAB — COMPREHENSIVE METABOLIC PANEL
ALT: 14 U/L (ref 0–55)
AST (SGOT): 13 U/L (ref 5–34)
Albumin/Globulin Ratio: 1 (ref 0.9–2.2)
Albumin: 3.4 g/dL — ABNORMAL LOW (ref 3.5–5.0)
Alkaline Phosphatase: 93 U/L (ref 37–106)
Anion Gap: 10 (ref 5.0–15.0)
BUN: 13 mg/dL (ref 7–19)
Bilirubin, Total: 0.2 mg/dL (ref 0.2–1.2)
CO2: 23 mEq/L (ref 22–29)
Calcium: 9.2 mg/dL (ref 8.5–10.5)
Chloride: 103 mEq/L (ref 100–111)
Creatinine: 0.7 mg/dL (ref 0.6–1.0)
Globulin: 3.4 g/dL (ref 2.0–3.6)
Glucose: 141 mg/dL — ABNORMAL HIGH (ref 70–100)
Potassium: 4 mEq/L (ref 3.5–5.1)
Protein, Total: 6.8 g/dL (ref 6.0–8.3)
Sodium: 136 mEq/L (ref 136–145)

## 2015-01-24 LAB — CBC AND DIFFERENTIAL
Basophils Absolute Automated: 0.03 10*3/uL (ref 0.00–0.20)
Basophils Automated: 0 %
Eosinophils Absolute Automated: 0.26 10*3/uL (ref 0.00–0.70)
Eosinophils Automated: 3 %
Hematocrit: 32.4 % — ABNORMAL LOW (ref 37.0–47.0)
Hgb: 10.9 g/dL — ABNORMAL LOW (ref 12.0–16.0)
Immature Granulocytes Absolute: 0.03 10*3/uL
Immature Granulocytes: 0 %
Lymphocytes Absolute Automated: 2.44 10*3/uL (ref 0.50–4.40)
Lymphocytes Automated: 27 %
MCH: 26 pg — ABNORMAL LOW (ref 28.0–32.0)
MCHC: 33.6 g/dL (ref 32.0–36.0)
MCV: 77.1 fL — ABNORMAL LOW (ref 80.0–100.0)
MPV: 9.4 fL (ref 9.4–12.3)
Monocytes Absolute Automated: 0.38 10*3/uL (ref 0.00–1.20)
Monocytes: 4 %
Neutrophils Absolute: 5.96 10*3/uL (ref 1.80–8.10)
Neutrophils: 66 %
Nucleated RBC: 0 /100 WBC (ref 0–1)
Platelets: 332 10*3/uL (ref 140–400)
RBC: 4.2 10*6/uL (ref 4.20–5.40)
RDW: 15 % (ref 12–15)
WBC: 9.07 10*3/uL (ref 3.50–10.80)

## 2015-01-24 LAB — TROPONIN I: Troponin I: <0.01 ng/mL (ref 0.00–0.09)

## 2015-01-24 LAB — PT AND APTT
PT INR: 1 (ref 0.9–1.1)
PT: 13.1 s (ref 12.6–15.0)
PTT: 29 s (ref 23–37)

## 2015-01-24 LAB — GFR: EGFR: >60

## 2015-01-24 LAB — MAGNESIUM: Magnesium: 2.1 mg/dL (ref 1.6–2.6)

## 2015-01-24 LAB — URINE HCG QUALITATIVE: Urine HCG Qualitative: NEGATIVE

## 2015-01-24 MED ORDER — SODIUM CHLORIDE 0.9 % IV BOLUS
500.0000 mL | Freq: Once | INTRAVENOUS | Status: AC
Start: 2015-01-24 — End: 2015-01-24
  Administered 2015-01-24: 500 mL via INTRAVENOUS

## 2015-01-24 NOTE — ED Notes (Signed)
Pt reports palpitations, nausea, dizziness, and intermittent sharp chest pain that began today.

## 2015-01-24 NOTE — ED Provider Notes (Addendum)
Physician/Midlevel provider first contact with patient: 01/24/15 1224         History     Chief Complaint   Patient presents with   . Palpitations     HPI   39 yo female with hx of htn, dm, and asthma, with hx of panic attacks presents with onset of lightheadedness and palpitations. With increased stress and momentary blurry vision.  No current blurry vision.  With no loc. No fall, no trauma. Pt states several times despite triage note that she never had chest pain. No sob. Pt states she did have increased stress due to conflicts she has with other people - not yet violent and police aware does not want further pd intervention.  Home is safe.  States some similarity to prior panic attacks. With nausea but no  Vomiting. Pt states syndrome relieved on arrival.   Pt denies changes in hearing, sensation, strength or coordination.  No head or neck pain. Pt denies chest pain, sob, abdominal pain, vomiting, diarrhea and extremity pain.    Pt denies hx of smoking / drug use (including cocaine, amphetamines and ivda) / cad / fhx of cad / hx of MI / Hx of dvt or pe / travel / recent surgery       Past Medical History   Diagnosis Date   . Diabetes mellitus without complication    . Asthma without status asthmaticus    . Anemia    . Depression    . Anemia    . Panic attack    . Seasonal allergies    . High cholesterol    . Hypertension        Past Surgical History   Procedure Laterality Date   . Cesarean section     . Cholecystectomy     . Tonsillectomy     . Hernia repair         Family History   Problem Relation Age of Onset   . Diabetes Mother    . Diabetes Sister    . Diabetes Maternal Aunt    . Hypertension Paternal Grandmother    . No known problems Daughter    . No known problems Son    . No known problems Daughter    . No known problems Son        Social  Social History   Substance Use Topics   . Smoking status: Never Smoker    . Smokeless tobacco: None   . Alcohol Use: No       .     Allergies   Allergen Reactions   .  Latex    . Morphine    . Percocet [Oxycodone-Acetaminophen]      Unknown reaction     . Tape        Home Medications                   ATORVASTATIN CALCIUM PO     Take by mouth.     GABAPENTIN PO     Take by mouth.     Insulin Glargine (LANTUS SC)     Inject 45 Units into the skin.        Liraglutide (VICTOZA SC)     Inject into the skin.     lisinopril (PRINIVIL,ZESTRIL) 20 MG tablet     Take 20 mg by mouth daily.     metFORMIN (GLUCOPHAGE) 500 MG tablet     Take 2 tablets (1,000 mg total) by mouth  2 (two) times daily with meals.     VITAMIN D, ERGOCALCIFEROL, PO     Take by mouth.           Flagged for Removal             clotrimazole-betamethasone (LOTRISONE) cream     Apply topically 2 (two) times daily.     diflunisal (DOLOBID) 500 MG Tab tablet     Take 1 tablet (500 mg total) by mouth 2 (two) times daily as needed (When necessary pain).     HYDROmorphone (DILAUDID) 2 MG tablet     Take 1 tablet (2 mg total) by mouth every 6 (six) hours as needed for Pain (As needed for pain).     nystatin (MYCOSTATIN) cream     Apply topically 2 (two) times daily.     traMADol (ULTRAM) 50 MG tablet     Take 1 tablet (50 mg total) by mouth every 6 (six) hours as needed for Pain. Do not drive or operate machinery while taking this medication        Ongoing Comment    Fredricka Bonine    09/13/2012  4:37 PM    Unknown medications           Review of Systems   Constitutional: Negative for fever and chills.   HENT: Negative for sore throat.    Eyes: Negative for visual disturbance.   Respiratory: Negative for cough and shortness of breath.    Cardiovascular: Positive for palpitations. Negative for chest pain and leg swelling.   Gastrointestinal: Positive for nausea. Negative for vomiting, abdominal pain and diarrhea.   Genitourinary: Negative for flank pain.   Musculoskeletal: Negative for back pain, arthralgias, gait problem, neck pain and neck stiffness.   Skin: Negative for wound.   Allergic/Immunologic: Negative for  immunocompromised state.   Neurological: Positive for light-headedness. Negative for dizziness, tremors, seizures, syncope, facial asymmetry, speech difficulty, weakness, numbness and headaches.   Hematological: Does not bruise/bleed easily.   Psychiatric/Behavioral: Negative for agitation.       Physical Exam    BP: 139/77 mmHg, Heart Rate: 89, Temp: 97.8 F (36.6 C), Resp Rate: 18, SpO2: 100 %, Weight: 140.4 kg    Physical Exam   Constitutional: She is oriented to person, place, and time. She appears well-developed and well-nourished. No distress.   HENT:   Head: Normocephalic and atraumatic.   Eyes: EOM are normal. Pupils are equal, round, and reactive to light.   lft eye 20/100. Rt eye 20/25 - pt states lft eye chronically poor vision.    Neck: Neck supple.   Cardiovascular: Normal rate, regular rhythm and normal heart sounds.  Exam reveals no gallop and no friction rub.    No murmur heard.  Pulmonary/Chest: Effort normal and breath sounds normal. No respiratory distress. She has no wheezes. She has no rales. She exhibits no tenderness.   Abdominal: Soft. Bowel sounds are normal. She exhibits no distension. There is no tenderness. There is no rebound and no guarding.   Musculoskeletal: Normal range of motion. She exhibits no edema or tenderness.   No calf tenderness or asymetry.    Neurological: She is alert and oriented to person, place, and time. No cranial nerve deficit. Coordination normal.   nml strength and sensation throughout.    Skin: Skin is warm and dry. No rash noted. No erythema.   Psychiatric: She has a normal mood and affect.         MDM  and ED Course     ED Medication Orders     Start Ordered     Status Ordering Provider    01/24/15 1252 01/24/15 1251  sodium chloride 0.9 % bolus 500 mL   Once     Route: Intravenous  Ordered Dose: 500 mL     Ordered Saleema Weppler S             MDM  Number of Diagnoses or Management Options  Near syncope: new and requires workup     Amount and/or Complexity  of Data Reviewed  Clinical lab tests: ordered and reviewed  Tests in the radiology section of CPT: reviewed and ordered  Tests in the medicine section of CPT: reviewed and ordered    Patient Progress  Patient progress: improved            Procedures  ekg - nsr with no acute st abnormalities, nml intervals and axis. Interpretation by myself.    Clinical Impression & Disposition     Clinical Impression  Final diagnoses:   None    39 yo female with near syncopal event with intermediate cardiac risk and with similarity to prior panic attacks.   Will do ekg and troponin to assess for ischemia and arrhythmia.  Will do cbc to assess for anemia and infection.  Will do bmp to assess for electrolyte abnormalities and kidney function. Will do coags to assess for bleeding risk.  Will do cxr to assess for infiltrate  Will give small fluid bolus and reassess.     ED Disposition     None      lab work nml, cxr nml, ekg nml. Pt felt well with fluid bolus.  Dx near syncope. Most likely vasovagal event. Pt [place] with pmd f/u within 3 days.      New Prescriptions    No medications on file                   Daylene Posey, MD  01/24/15 1459    Daylene Posey, MD  01/24/15 [phone]

## 2015-01-24 NOTE — ED Notes (Signed)
Bed: M 16  Expected date:   Expected time:   Means of arrival:   Comments:  medic

## 2015-01-24 NOTE — Discharge Instructions (Signed)
Near Syncope     You have been diagnosed with a "Near-Fainting" spell, also called "Near-Syncope."     Syncope is the medical term for "loss of consciousness," also known as a fainting spell or simply fainting. "Near-Syncope" means almost fainting. Some causes for a complete faint can cause an "almost-faint."     There are many reasons for fainting episodes. Some fainting episodes can be from life-threatening causes while others are from non-serious causes. Most patients with life-threatening causes check into the hospital for more tests. Patients thought to have non-life threatening causes may be sent home.     Near-syncope has a few non-life threatening causes. Some are dehydration, heat exhaustion, vasovagal events (simple faint) and new medicine side-effects.     Syncope treatment depends on the cause. It is a good idea to drink a lot of fluids at least 24 hours after a near-fainting episode. Also avoid hard physical activity at least 24 hours after an episode.     Follow up with your primary care doctor for a recheck.     YOU SHOULD SEEK MEDICAL ATTENTION IMMEDIATELY, EITHER HERE OR AT THE NEAREST EMERGENCY DEPARTMENT, IF ANY OF THE FOLLOWING OCCURS:  · Repeated fainting episodes.  · Any chest pain with the fainting.  · Any palpitations or strange heart beats before fainting.  · Any blood in your stools (poop).  · Feeling weak, lightheaded or not getting better as expected.  · Any other worsening symptoms or concerns develop.

## 2015-01-27 LAB — ECG 12-LEAD
Atrial Rate: 91 {beats}/min
P Axis: 48 degrees
P-R Interval: 160 ms
Q-T Interval: 386 ms
QRS Duration: 96 ms
QTC Calculation (Bezet): 474 ms
R Axis: 12 degrees
T Axis: 27 degrees
Ventricular Rate: 91 {beats}/min

## 2015-03-01 ENCOUNTER — Emergency Department: Payer: Medicare Other

## 2015-03-01 ENCOUNTER — Emergency Department
Admission: EM | Admit: 2015-03-01 | Discharge: 2015-03-01 | Disposition: A | Discharge status: Home / Self Care | Payer: Medicare Other | Attending: Emergency Medicine | Admitting: Emergency Medicine

## 2015-03-01 DIAGNOSIS — G8929 Other chronic pain: Secondary | ICD-10-CM

## 2015-03-01 DIAGNOSIS — Z794 Long term (current) use of insulin: Secondary | ICD-10-CM | POA: Insufficient documentation

## 2015-03-01 DIAGNOSIS — J45909 Unspecified asthma, uncomplicated: Secondary | ICD-10-CM | POA: Insufficient documentation

## 2015-03-01 DIAGNOSIS — E7800 Pure hypercholesterolemia, unspecified: Secondary | ICD-10-CM | POA: Insufficient documentation

## 2015-03-01 DIAGNOSIS — E119 Type 2 diabetes mellitus without complications: Secondary | ICD-10-CM | POA: Insufficient documentation

## 2015-03-01 DIAGNOSIS — M25522 Pain in left elbow: Secondary | ICD-10-CM | POA: Insufficient documentation

## 2015-03-01 DIAGNOSIS — I10 Essential (primary) hypertension: Secondary | ICD-10-CM | POA: Insufficient documentation

## 2015-03-01 MED ORDER — BACITRACIN ZINC 500 UNIT/GM EX OINT
TOPICAL_OINTMENT | Freq: Once | CUTANEOUS | Status: DC
Start: 2015-03-01 — End: 2015-03-01

## 2015-03-01 NOTE — ED Notes (Signed)
pt states she is here for a injury that occured back in April to left elbow; pt was seen in July for same issue

## 2015-03-01 NOTE — Discharge Instructions (Signed)
Tylenol and motrin as needed for pain  Wear sling and ace wrap as needed for discomfort  Follow up with ortho and PCP  Follow up with pain management  Rest, Ice, Compress (sling/ace wrap) and Elevate.  Follow up immediately in ED for signs of decreased sensation (change in color, skin cool to touch, increased swelling or pain) or any other concerns    Joint Pain    You have been seen for joint pain.    Many things can cause pain in your joints. Having joint pain means that you often have inflammation in your joints.     Some things that can cause joint pain are:   Rheumatoid arthritis (inflammation of the joints).   Trauma or injury to your joints.   A viral infection.   Overuse of your joints.   Obesity that causes excess wear and tear on your joints.   Gout (swelling of the joints).   Osteoarthritis (breakdown of cartilage in the joints).   Inflammation of a joint tendon (tendinitis).    It is not yet known why you are having joint pains. You might need another exam or more tests to find out why you have these symptoms. At this time, the cause of your symptoms does not seem dangerous. You don t need to stay in the hospital.    Some things you can try at home are:   Rest the affected joint.   Frequent stretching.   Massage.   NSAID medications like ibuprofen (Advil or Motrin), naproxen (Aleve, Naprosyn).   Elevating the joints that hurt.    Follow the instructions for any medication you are prescribed.     Follow up with your doctor in 7 days.    We don't believe your condition is dangerous right now. However, you need to be careful. Sometimes a problem that seems small can get serious later. Therefore, it is very important for you to come back here or go to the nearest Emergency Department if you don t get better or your symptoms get worse.    YOU SHOULD SEEK MEDICAL ATTENTION IMMEDIATELY, EITHER HERE OR AT THE NEAREST EMERGENCY DEPARTMENT, IF ANY OF THE FOLLOWING OCCUR:     You have a  fever (temperature higher than 100.91F or 38C).   Your pain does not go away or gets worse.   You notice redness over the joint.   Your painful joint gets very swollen.   You don t feel better after treatment.   Any other symptoms, concerns, or not getting better as expected.    If you can t follow up with your doctor, or if at any time you feel you need to be rechecked or seen again, come back here or go to the nearest emergency department.              Chronic Pain Exacerbation    You have been seen for your chronic pain problem.    The Emergency Department/Urgent Care is available for emergencies related to your painful problem. However, you need to see your regular doctor for ongoing care for your pain.    ALL pain medicine refills MUST be done by your primary care doctor or the pain specialist who takes care of your pain.    The Emergency Department/Urgent Care is not the right place for the ongoing care of chronic pain.    If you have not seen a pain specialist, ask the doctor for information. He or she can give you information  on who to contact and help refer you to one.    If you feel you are not able to get proper pain treatment, contact your health plan for advice.    YOU MUST PLAN IN ADVANCE! If you are running low on pain medicine, plan in advance and get a refill from your doctor. This is your responsibility. NEVER wait until a weekend to try to reach your doctor if you are running out of pain medicine. Emergency /Urgent Care Physicians usually only give a single dose of pain medicine. This means you may not have enough for the rest of the weekend.    THIS STATEMENT IS PROVIDED FOR YOUR PROTECTION AND FOR YOUR INFORMATION ONLY AND IS NOT MEANT TO IMPLY ANY ILLEGAL ACTIVITY ON YOUR PART: It is a serious crime to lie to a doctor to get narcotic pain medicine. This includes but is not limited to: Getting multiple prescriptions for pain medicine by seeing more than one doctor, giving false  information to get a prescription and lying about your medical condition. This includes the kind of condition and how serious it is.   Some common narcotic pain medicines: oxycodone/acetaminophen (Percocet, Roxicet), hydrocodone/acetaminophen (Vicodin, Lortab, Norco), oxycodone-long acting (OxyContin), meperidine (Demerol), hydromorphone (Dilaudid), morphine (MSIR, MS Contin, Kadian, Avinza, Oramorph, Roxanol, Astramorph, Duramorph, others), codeine/acetaminophen (T3, T4), acetaminophen/propoxyphene (Darvocet, Wygesic), propoxyphene (Darvon).    YOU SHOULD SEEK MEDICAL ATTENTION IMMEDIATELY, EITHER HERE OR AT THE NEAREST EMERGENCY DEPARTMENT, IF ANY OF THE FOLLOWING OCCURS:   Any major change in your pain pattern.   Symptoms become worse.   You have any other concerns.    If you can t follow up with your doctor, or if at any time you feel you need to be rechecked or seen again, come back here or go to the nearest emergency department.

## 2015-03-01 NOTE — ED Notes (Signed)
Patient requesting a white hard splint to left elbow  Patient very insistent on what she wants, refusing ace wrap and sling  Dr. Thad Ranger brought to bedside to speak with patient  Patient yelling with staff and insistent on a splint    Patient stating "I will call my insurance to make sure you don't get paid."    Patient ambulated out of ED in NAD, no sling or splint  Patient refused D/C instructions and Vital signs upon D/C
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
## 2015-03-05 ENCOUNTER — Emergency Department: Payer: Medicare Other

## 2015-03-05 ENCOUNTER — Emergency Department
Admission: EM | Admit: 2015-03-05 | Discharge: 2015-03-05 | Disposition: A | Discharge status: Home / Self Care | Payer: Medicare Other | Attending: Emergency Medicine | Admitting: Emergency Medicine

## 2015-03-05 DIAGNOSIS — Z794 Long term (current) use of insulin: Secondary | ICD-10-CM | POA: Insufficient documentation

## 2015-03-05 DIAGNOSIS — R739 Hyperglycemia, unspecified: Secondary | ICD-10-CM

## 2015-03-05 DIAGNOSIS — E1165 Type 2 diabetes mellitus with hyperglycemia: Secondary | ICD-10-CM | POA: Insufficient documentation

## 2015-03-05 DIAGNOSIS — E7800 Pure hypercholesterolemia, unspecified: Secondary | ICD-10-CM | POA: Insufficient documentation

## 2015-03-05 DIAGNOSIS — I10 Essential (primary) hypertension: Secondary | ICD-10-CM | POA: Insufficient documentation

## 2015-03-05 DIAGNOSIS — J45909 Unspecified asthma, uncomplicated: Secondary | ICD-10-CM | POA: Insufficient documentation

## 2015-03-05 DIAGNOSIS — R109 Unspecified abdominal pain: Secondary | ICD-10-CM | POA: Insufficient documentation

## 2015-03-05 LAB — CBC AND DIFFERENTIAL
Basophils Absolute Automated: 0.04 10*3/uL (ref 0.00–0.20)
Basophils Automated: 0 %
Eosinophils Absolute Automated: 0.23 10*3/uL (ref 0.00–0.70)
Eosinophils Automated: 3 %
Hematocrit: 33.8 % — ABNORMAL LOW (ref 37.0–47.0)
Hgb: 11.7 g/dL — ABNORMAL LOW (ref 12.0–16.0)
Immature Granulocytes Absolute: 0.02 10*3/uL
Immature Granulocytes: 0 %
Lymphocytes Absolute Automated: 2.71 10*3/uL (ref 0.50–4.40)
Lymphocytes Automated: 31 %
MCH: 26.7 pg — ABNORMAL LOW (ref 28.0–32.0)
MCHC: 34.6 g/dL (ref 32.0–36.0)
MCV: 77 fL — ABNORMAL LOW (ref 80.0–100.0)
MPV: 9.9 fL (ref 9.4–12.3)
Monocytes Absolute Automated: 0.44 10*3/uL (ref 0.00–1.20)
Monocytes: 5 %
Neutrophils Absolute: 5.29 10*3/uL (ref 1.80–8.10)
Neutrophils: 61 %
Nucleated RBC: 0 /100 WBC (ref 0–1)
Platelets: 338 10*3/uL (ref 140–400)
RBC: 4.39 10*6/uL (ref 4.20–5.40)
RDW: 14 % (ref 12–15)
WBC: 8.71 10*3/uL (ref 3.50–10.80)

## 2015-03-05 LAB — COMPREHENSIVE METABOLIC PANEL
ALT: 11 U/L (ref 0–55)
AST (SGOT): 12 U/L (ref 5–34)
Albumin/Globulin Ratio: 0.9 (ref 0.9–2.2)
Albumin: 3.2 g/dL — ABNORMAL LOW (ref 3.5–5.0)
Alkaline Phosphatase: 102 U/L (ref 37–106)
Anion Gap: 10 (ref 5.0–15.0)
BUN: 14 mg/dL (ref 7–19)
Bilirubin, Total: 0.1 mg/dL — ABNORMAL LOW (ref 0.2–1.2)
CO2: 23 mEq/L (ref 22–29)
Calcium: 8.9 mg/dL (ref 8.5–10.5)
Chloride: 100 mEq/L (ref 100–111)
Creatinine: 0.8 mg/dL (ref 0.6–1.0)
Globulin: 3.6 g/dL (ref 2.0–3.6)
Glucose: 414 mg/dL — ABNORMAL HIGH (ref 70–100)
Potassium: 4.2 mEq/L (ref 3.5–5.1)
Protein, Total: 6.8 g/dL (ref 6.0–8.3)
Sodium: 133 mEq/L — ABNORMAL LOW (ref 136–145)

## 2015-03-05 LAB — URINALYSIS, REFLEX TO MICROSCOPIC EXAM IF INDICATED
Bilirubin, UA: NEGATIVE
Blood, UA: NEGATIVE
Glucose, UA: >=500 — AB
Ketones UA: NEGATIVE
Leukocyte Esterase, UA: NEGATIVE
Nitrite, UA: NEGATIVE
Protein, UR: NEGATIVE
Specific Gravity UA: 1.027 (ref 1.001–1.035)
Urine pH: 6 (ref 5.0–8.0)
Urobilinogen, UA: NEGATIVE mg/dL

## 2015-03-05 LAB — GFR: EGFR: >60

## 2015-03-05 LAB — GLUCOSE WHOLE BLOOD - POCT
Whole Blood Glucose POCT: 417 mg/dL — ABNORMAL HIGH (ref 70–100)
Whole Blood Glucose POCT: 233 mg/dL — ABNORMAL HIGH (ref 70–100)

## 2015-03-05 LAB — URINE HCG QUALITATIVE: Urine HCG Qualitative: NEGATIVE

## 2015-03-05 MED ORDER — KETOROLAC TROMETHAMINE 30 MG/ML IJ SOLN
15.0000 mg | Freq: Once | INTRAMUSCULAR | Status: AC
Start: 2015-03-05 — End: 2015-03-05
  Administered 2015-03-05: 15 mg via INTRAVENOUS
  Filled 2015-03-05: qty 1

## 2015-03-05 MED ORDER — SODIUM CHLORIDE 0.9 % IV BOLUS
1000.0000 mL | Freq: Once | INTRAVENOUS | Status: AC
Start: 2015-03-05 — End: 2015-03-05
  Administered 2015-03-05: 1000 mL via INTRAVENOUS

## 2015-03-05 MED ORDER — ONDANSETRON HCL 4 MG/2ML IJ SOLN
4.0000 mg | Freq: Once | INTRAMUSCULAR | Status: AC
Start: 2015-03-05 — End: 2015-03-05
  Administered 2015-03-05: 4 mg via INTRAVENOUS
  Filled 2015-03-05: qty 2

## 2015-03-05 MED ORDER — INSULIN REGULAR HUMAN 100 UNIT/ML IJ SOLN
9.0000 [IU] | Freq: Once | INTRAMUSCULAR | Status: AC
Start: 2015-03-05 — End: 2015-03-05
  Administered 2015-03-05: 9 [IU] via INTRAVENOUS
  Filled 2015-03-05: qty 27

## 2015-03-05 MED ORDER — ACETAMINOPHEN-CODEINE #3 300-30 MG PO TABS
2.0000 | ORAL_TABLET | Freq: Once | ORAL | Status: AC
Start: 2015-03-05 — End: 2015-03-05
  Administered 2015-03-05: 2 via ORAL
  Filled 2015-03-05: qty 2

## 2015-03-05 MED ORDER — IBUPROFEN 600 MG PO TABS
600.0000 mg | ORAL_TABLET | Freq: Four times a day (QID) | ORAL | Status: DC | PRN
Start: 2015-03-05 — End: 2015-03-12

## 2015-03-05 NOTE — Discharge Instructions (Signed)
Follow up with your primary physician. Take ibuprofen as needed for your symptoms.     Flank Pain    You have been diagnosed with Flank Pain.    Flank Pain means that you are having pain in your side. Flank pain can have many causes. Some of the most common causes are:     Kidney stones.   A pulled muscle (muscle strain).   Kidney infection (pyelonephritis).   Pneumonia (pain going down from the lungs).   A viral skin disease called shingles.   A gallbladder problem (gall stones) if the pain is on your right side.   In rare cases, a more serious or dangerous condition can cause flank pain.    During your visit, you had a CT scan done to help evaluate your pain. The scan did not show any serious problems. It also did not tell us the cause of your pain.    It is not clear today why you are having flank pain. Your doctor did an exam and a work-up and does not feel that the cause of your flank pain is life-threatening. You might need another exam or more tests to find out why you have these symptoms. At this time, the cause of your symptoms does not seem dangerous and you don t need to stay in the hospital.    We don't believe your condition is dangerous right now. However, you need to be careful. Sometimes a problem that seems small can get serious later. Therefore, it is very important for you to come back here or go to the nearest Emergency Department if you don t get better or your symptoms get worse.     Some things you can try at home are:     Get plenty of rest.   Over-the-counter pain medications like ibuprofen (Advil or Motrin), naproxen (Aleve, Naprosyn), or acetaminophen (Tylenol).   Warm compresses.   Drink lots of liquids.   Follow the instructions for any medication you get prescribed.    Follow up with a doctor right away.    YOU SHOULD SEEK MEDICAL ATTENTION IMMEDIATELY, EITHER HERE OR AT THE NEAREST EMERGENCY DEPARTMENT, IF ANY OF THE FOLLOWING OCCURS:     You have a fever  (temperature higher than 100.44F or 38C).   Your pain does not go away or gets worse.   You can t keep fluids down or throw up many times.   You start having pain that spreads from your flank to the rest of your abdomen (belly).   You don t feel better after treatment.   Any other symptoms, concerns, or not getting better as expected.    If you can t follow up with your doctor, or if at any time you feel you need to be rechecked or seen again, come back here or go to the nearest emergency department.

## 2015-03-05 NOTE — ED Notes (Signed)
Brought in by ems left flank pain onset today - blood sugar 444- denies urinary symptoms

## 2015-03-05 NOTE — ED Notes (Signed)
Bed: M 12  Expected date:   Expected time:   Means of arrival:   Comments:  Medic

## 2015-03-05 NOTE — ED Provider Notes (Signed)
EMERGENCY DEPARTMENT NOTE    Physician/Midlevel provider first contact with patient: 03/05/15 1940         HISTORY OF PRESENT ILLNESS   Historian:Patient  Translator Used: No    Chief Complaint: Flank Pain      39 y.o. female hx of htn, DM, asthma who presents with left flank pain. Pt reports symptom onset earlier this morning at approximately noon. Sudden in nature. Reports no trauma or falls. No dysuria noted. No bowel irregularities. Pt reports earlier today, she fell and hit the left side of her flank, worsening the pain.     Of note, pt has had prior ED visits for chronic left elbow pain. There has been concern for narcotic abuse.      1. Location of symptoms: left flank  2. Onset of symptoms: this morning  3. What was patient doing when symptoms started (Context): see above  4. Severity: moderate  5. Timing: constant  6. Activities that worsen symptoms: movement  7. Activities that improve symptoms: at rest   8. Quality: throbbing   9. Radiation of symptoms: no  10. Associated signs and Symptoms: see above  11. Are symptoms worsening? yes  MEDICAL HISTORY     Past Medical History:  Past Medical History   Diagnosis Date   . Diabetes mellitus without complication    . Asthma without status asthmaticus    . Anemia    . Depression    . Anemia    . Panic attack    . Seasonal allergies    . High cholesterol        Past Surgical History:  Past Surgical History   Procedure Laterality Date   . Cesarean section     . Cholecystectomy     . Tonsillectomy     . Hernia repair         Social History:  Social History     Social History   . Marital Status: Single     Spouse Name: N/A   . Number of Children: N/A   . Years of Education: N/A     Occupational History   . Not on file.     Social History Main Topics   . Smoking status: Never Smoker    . Smokeless tobacco: Not on file   . Alcohol Use: No   . Drug Use: No   . Sexual Activity: Not on file     Other Topics Concern   . Not on file     Social History Narrative        Family History:  Family History   Problem Relation Age of Onset   . Diabetes Mother    . Diabetes Sister    . Diabetes Maternal Aunt    . Hypertension Paternal Grandmother    . No known problems Daughter    . No known problems Son    . No known problems Daughter    . No known problems Son        Outpatient Medication:  Discharge Medication List as of 03/05/2015 10:00 PM      CONTINUE these medications which have NOT CHANGED    Details   ATORVASTATIN CALCIUM PO Take by mouth., Until Discontinued, Historical Med      clotrimazole-betamethasone (LOTRISONE) cream Apply topically 2 (two) times daily., Starting 08/06/2013, Until Discontinued, Print      diflunisal (DOLOBID) 500 MG Tab tablet Take 1 tablet (500 mg total) by mouth 2 (two) times daily as needed (  When necessary pain)., Starting 11/07/2014, Until Discontinued, Print      GABAPENTIN PO Take by mouth., Until Discontinued, Historical Med      HYDROmorphone (DILAUDID) 2 MG tablet Take 1 tablet (2 mg total) by mouth every 6 (six) hours as needed for Pain (As needed for pain)., Starting 08/11/2014, Until Discontinued, Print      Insulin Glargine (LANTUS SC) Inject 45 Units into the skin.   , Until Discontinued, Historical Med      Liraglutide (VICTOZA SC) Inject into the skin., Until Discontinued, Historical Med      lisinopril (PRINIVIL,ZESTRIL) 20 MG tablet Take 20 mg by mouth daily., Until Discontinued, Historical Med      metFORMIN (GLUCOPHAGE) 500 MG tablet Take 2 tablets (1,000 mg total) by mouth 2 (two) times daily with meals., Starting 07/02/2013, Until Discontinued, Print      nystatin (MYCOSTATIN) cream Apply topically 2 (two) times daily., Starting 08/06/2013, Until Discontinued, Print      traMADol (ULTRAM) 50 MG tablet Take 1 tablet (50 mg total) by mouth every 6 (six) hours as needed for Pain. Do not drive or operate machinery while taking this medication, Starting 11/06/2014, Until Discontinued, Print      VITAMIN D, ERGOCALCIFEROL, PO Take by mouth.,  Until Discontinued, Historical Med               REVIEW OF SYSTEMS   Review of Systems  Constitutional: Negative for fever and chills.   HENT: Negative for congestion and sore throat.  Eyes: Negative for eye discharge and eye redness.   Cardiovascular: Negative for chest pain and chest pressure   Respiratory: Negative for cough and sputum production.    Gastrointestinal: Negative for nausea, vomiting, abdominal pain and diarrhea.   Genitourinary: Positive for left flank pain   Neurological: Negative for dizziness, focal weakness, numbness.   All other systems negative.  PHYSICAL EXAM   ED Triage Vitals   Enc Vitals Group      BP 03/05/15 1908 112/77 mmHg      Heart Rate 03/05/15 1908 97      Resp Rate 03/05/15 1908 18      Temp 03/05/15 1908 97.8 F (36.6 C)      Temp Source 03/05/15 1908 Oral      SpO2 03/05/15 1908 98 %      Weight 03/05/15 1908 99.338 kg      Height 03/05/15 1908 1.803 m      Head Cir --       Peak Flow --       Pain Score 03/05/15 1908 9      Pain Loc --       Pain Edu? --       Excl. in GC? --        Constitutional: Vital signs reviewed. Well appearing, well hydrated, well perfused, non-toxic appearing, obese habitus   Head:  Normocephalic, atraumatic  Eyes: PERRL, normal conjunctiva bilaterally, EOMI  ENT: Mucous membranes moist.   Neck: Normal range of motion. Non-tender.   Respiratory/Chest: clear to auscultation. No work of breathing. No tachypnea..  Cardiovascular: Regular rate and rhythm. No murmur.   Abdomen: left CVA tenderness to palpation   UpperExtremity: No edema or cyanosis.  LowerExtremity: No edema or cyanosis.  Neurological: Awake and alert. No focal motor deficits by observation.  Skin: Warm and dry. No rash.    MEDICAL DECISION MAKING     39 y.o. female hx of htn, DM, asthma who presents with left  flank pain    Consider MSK vs. Pyelo vs kidney stone     Will obtain CT stone   T3, toradol for pain control     Labs, U/a    Reassessment/Updates:   - pt noted to be hyperglycemic.  Given insulin and IVF. CT negative for acute process. Noted stable pelvic hernia. Will discharge home with ibuprofen. Instructed to follow-up with primary physician         DISCUSSION        Vital Signs: Reviewed the patient?s vital signs.   Nursing Notes: Reviewed and utilized available nursing notes.  Medical Records Reviewed: Reviewed available past medical records.  Counseling: The emergency provider has spoken with the patient and discussed today?s findings, in addition to providing specific details for the plan of care.  Questions are answered and there is agreement with the plan.      CARDIAC STUDIES    The following cardiac studies were independently interpreted by the Emergency Medicine Physician.  For full cardiac study results please see chart.    Monitor Strip  Interpreted by ED Physician  Rate: 89  Rhythm: NSR   ST Changes: none    IMAGING STUDIES    The following imaging studies were independently interpreted by the Emergency Medicine Physician.  For full imaging study results please see chart.      PULSE OXIMETRY    Oxygen Saturation by Pulse Oximetry: 100%  Interventions: none  Interpretation:  Normal     EMERGENCY DEPT. MEDICATIONS      ED Medication Orders     Start Ordered     Status Ordering Provider    03/05/15 2132 03/05/15 2131  ondansetron (ZOFRAN) injection 4 mg   Once     Route: Intravenous  Ordered Dose: 4 mg     Last MAR action:  Given ADJEI-TWUM, Shamera Yarberry    03/05/15 2127 03/05/15 2126  acetaminophen-codeine (TYLENOL #3) 300-30 MG per tablet 2 tablet   Once     Route: Oral  Ordered Dose: 2 tablet     Last MAR action:  Given ADJEI-TWUM, Emidio Warrell    03/05/15 2040 03/05/15 2040  ketorolac (TORADOL) injection 15 mg   Once     Route: Intravenous  Ordered Dose: 15 mg     Last MAR action:  Given ADJEI-TWUM, Diva Lemberger    03/05/15 2035 03/05/15 2034  insulin regular (HumuLIN R,NovoLIN R) injection 9 Units   Once     Route: Intravenous  Ordered Dose: 9 Units     Last MAR action:  Given ADJEI-TWUM, Elowen Debruyn     03/05/15 1943 03/05/15 1942  sodium chloride 0.9 % bolus 1,000 mL   Once     Route: Intravenous  Ordered Dose: 1,000 mL     Last MAR action:  New Bag ADJEI-TWUM, Markisha Meding          LABORATORY RESULTS    Ordered and independently interpreted AVAILABLE laboratory tests. Please see results section in chart for full details.  Results for orders placed or performed during the hospital encounter of 03/05/15   UA, Reflex to Microscopic   Result Value Ref Range    Urine Type Clean Catch     Color, UA Straw Clear - Yellow    Clarity, UA Clear Clear - Hazy    Specific Gravity UA 1.027 1.001-1.035    Urine pH 6.0 5.0-8.0    Leukocyte Esterase, UA Negative Negative    Nitrite, UA Negative Negative    Protein, UR Negative Negative  Glucose, UA >=500 (A) Negative    Ketones UA Negative Negative    Urobilinogen, UA Negative 0.2  -  2.0 mg/dL    Bilirubin, UA Negative Negative    Blood, UA Negative Negative    RBC, UA 0 - 5 0 - 5 /hpf    WBC, UA 0 - 5 0 - 5 /hpf    Squamous Epithelial Cells, Urine 0 - 5 0 - 25 /hpf   Comprehensive Metabolic Panel (CMP)   Result Value Ref Range    Glucose 414 (H) 70 - 100 mg/dL    BUN 14 7 - 19 mg/dL    Creatinine 0.8 0.6 - 1.0 mg/dL    Sodium 578 (L) 469 - 145 mEq/L    Potassium 4.2 3.5 - 5.1 mEq/L    Chloride 100 100 - 111 mEq/L    CO2 23 22 - 29 mEq/L    Calcium 8.9 8.5 - 10.5 mg/dL    Protein, Total 6.8 6.0 - 8.3 g/dL    Albumin 3.2 (L) 3.5 - 5.0 g/dL    AST (SGOT) 12 5 - 34 U/L    ALT 11 0 - 55 U/L    Alkaline Phosphatase 102 37 - 106 U/L    Bilirubin, Total 0.1 (L) 0.2 - 1.2 mg/dL    Globulin 3.6 2.0 - 3.6 g/dL    Albumin/Globulin Ratio 0.9 0.9 - 2.2    Anion Gap 10.0 5.0 - 15.0   CBC with differential   Result Value Ref Range    WBC 8.71 3.50 - 10.80 x10 3/uL    Hgb 11.7 (L) 12.0 - 16.0 g/dL    Hematocrit 62.9 (L) 37.0 - 47.0 %    Platelets 338 140 - 400 x10 3/uL    RBC 4.39 4.20 - 5.40 x10 6/uL    MCV 77.0 (L) 80.0 - 100.0 fL    MCH 26.7 (L) 28.0 - 32.0 pg    MCHC 34.6 32.0 - 36.0 g/dL     RDW 14 12 - 15 %    MPV 9.9 9.4 - 12.3 fL    Neutrophils 61 None %    Lymphocytes Automated 31 None %    Monocytes 5 None %    Eosinophils Automated 3 None %    Basophils Automated 0 None %    Immature Granulocyte 0 None %    Nucleated RBC 0 0 - 1 /100 WBC    Neutrophils Absolute 5.29 1.80 - 8.10 x10 3/uL    Abs Lymph Automated 2.71 0.50 - 4.40 x10 3/uL    Abs Mono Automated 0.44 0.00 - 1.20 x10 3/uL    Abs Eos Automated 0.23 0.00 - 0.70 x10 3/uL    Absolute Baso Automated 0.04 0.00 - 0.20 x10 3/uL    Absolute Immature Granulocyte 0.02 0 x10 3/uL   GFR   Result Value Ref Range    EGFR >60.0    Urine HCG Qualitative   Result Value Ref Range    Urine HCG Qualitative Negative Negative   Glucose Whole Blood - POCT   Result Value Ref Range    POCT - Glucose Whole blood 417 (H) 70 - 100 mg/dL   Glucose Whole Blood - POCT   Result Value Ref Range    POCT - Glucose Whole blood 233 (H) 70 - 100 mg/dL           DIAGNOSIS      Diagnosis:  Final diagnoses:   Flank pain   Hyperglycemia  Disposition:  ED Disposition     Discharge Allegra Grana discharge to home/self care.    Condition at disposition: Stable    Patient is stable and understands discharge instructions.          Prescriptions:  Discharge Medication List as of 03/05/2015 10:00 PM      START taking these medications    Details   ibuprofen (ADVIL,MOTRIN) 600 MG tablet Take 1 tablet (600 mg total) by mouth every 6 (six) hours as needed for Pain., Starting 03/05/2015, Until Discontinued, Print         CONTINUE these medications which have NOT CHANGED    Details   ATORVASTATIN CALCIUM PO Take by mouth., Until Discontinued, Historical Med      clotrimazole-betamethasone (LOTRISONE) cream Apply topically 2 (two) times daily., Starting 08/06/2013, Until Discontinued, Print      diflunisal (DOLOBID) 500 MG Tab tablet Take 1 tablet (500 mg total) by mouth 2 (two) times daily as needed (When necessary pain)., Starting 11/07/2014, Until Discontinued, Print       GABAPENTIN PO Take by mouth., Until Discontinued, Historical Med      HYDROmorphone (DILAUDID) 2 MG tablet Take 1 tablet (2 mg total) by mouth every 6 (six) hours as needed for Pain (As needed for pain)., Starting 08/11/2014, Until Discontinued, Print      Insulin Glargine (LANTUS SC) Inject 45 Units into the skin.   , Until Discontinued, Historical Med      Liraglutide (VICTOZA SC) Inject into the skin., Until Discontinued, Historical Med      lisinopril (PRINIVIL,ZESTRIL) 20 MG tablet Take 20 mg by mouth daily., Until Discontinued, Historical Med      metFORMIN (GLUCOPHAGE) 500 MG tablet Take 2 tablets (1,000 mg total) by mouth 2 (two) times daily with meals., Starting 07/02/2013, Until Discontinued, Print      nystatin (MYCOSTATIN) cream Apply topically 2 (two) times daily., Starting 08/06/2013, Until Discontinued, Print      traMADol (ULTRAM) 50 MG tablet Take 1 tablet (50 mg total) by mouth every 6 (six) hours as needed for Pain. Do not drive or operate machinery while taking this medication, Starting 11/06/2014, Until Discontinued, Print      VITAMIN D, ERGOCALCIFEROL, PO Take by mouth., Until Discontinued, Historical Med                   Rulon Abide, MD  03/05/15 2219

## 2015-03-12 ENCOUNTER — Emergency Department: Payer: Medicare Other

## 2015-03-12 ENCOUNTER — Emergency Department
Admission: EM | Admit: 2015-03-12 | Discharge: 2015-03-12 | Disposition: A | Discharge status: Home / Self Care | Payer: Medicare Other | Attending: Emergency Medicine | Admitting: Emergency Medicine

## 2015-03-12 DIAGNOSIS — Z794 Long term (current) use of insulin: Secondary | ICD-10-CM | POA: Insufficient documentation

## 2015-03-12 DIAGNOSIS — W19XXXA Unspecified fall, initial encounter: Secondary | ICD-10-CM | POA: Insufficient documentation

## 2015-03-12 DIAGNOSIS — E119 Type 2 diabetes mellitus without complications: Secondary | ICD-10-CM | POA: Insufficient documentation

## 2015-03-12 DIAGNOSIS — S93492A Sprain of other ligament of left ankle, initial encounter: Secondary | ICD-10-CM | POA: Insufficient documentation

## 2015-03-12 DIAGNOSIS — J45909 Unspecified asthma, uncomplicated: Secondary | ICD-10-CM | POA: Insufficient documentation

## 2015-03-12 DIAGNOSIS — E7800 Pure hypercholesterolemia, unspecified: Secondary | ICD-10-CM | POA: Insufficient documentation

## 2015-03-12 LAB — URINE HCG QUALITATIVE: Urine HCG Qualitative: NEGATIVE

## 2015-03-12 MED ORDER — DIPHENHYDRAMINE HCL 25 MG PO CAPS
25.0000 mg | ORAL_CAPSULE | Freq: Once | ORAL | Status: AC
Start: 2015-03-12 — End: 2015-03-12
  Administered 2015-03-12: 25 mg via ORAL
  Filled 2015-03-12: qty 1

## 2015-03-12 MED ORDER — IBUPROFEN 600 MG PO TABS
600.0000 mg | ORAL_TABLET | Freq: Four times a day (QID) | ORAL | Status: DC | PRN
Start: 2015-03-12 — End: 2015-03-15

## 2015-03-12 MED ORDER — IBUPROFEN 600 MG PO TABS
600.0000 mg | ORAL_TABLET | Freq: Once | ORAL | Status: AC
Start: 2015-03-12 — End: 2015-03-12
  Administered 2015-03-12: 600 mg via ORAL
  Filled 2015-03-12: qty 1

## 2015-03-12 NOTE — ED Provider Notes (Signed)
EMERGENCY DEPARTMENT NOTE    Physician/Midlevel provider first contact with patient: 03/12/15 1746         HISTORY OF PRESENT ILLNESS   Historian: patient  Translator Used: no    39 y.o. female presents with left ankle pain and foot pain (points to lateral ankle only).    1. Location of symptoms: left ankle pain, lateral  2. Onset of symptoms: unclear  3. What was patient doing when symptoms started (Context): s/p fall in July 2016  4. Severity: severe  5. Timing: constant  6. Activities that worsen symptoms: none  7. Activities that improve symptoms: none  8. Quality: pain   9. Radiation of symptoms: none  10. Associated signs and Symptoms: no numbness/weakness   11. Are symptoms worsening? yes    MEDICAL HISTORY     Past Medical History:  Past Medical History   Diagnosis Date   . Diabetes mellitus without complication    . Asthma without status asthmaticus    . Anemia    . Depression    . Anemia    . Panic attack    . Seasonal allergies    . High cholesterol        Past Surgical History:  Past Surgical History   Procedure Laterality Date   . Cesarean section     . Cholecystectomy     . Tonsillectomy     . Hernia repair         Social History:  Social History     Social History   . Marital Status: Single     Spouse Name: N/A   . Number of Children: N/A   . Years of Education: N/A     Occupational History   . Not on file.     Social History Main Topics   . Smoking status: Never Smoker    . Smokeless tobacco: Not on file   . Alcohol Use: No   . Drug Use: No   . Sexual Activity: Not on file     Other Topics Concern   . Not on file     Social History Narrative       Family History:  Family History   Problem Relation Age of Onset   . Diabetes Mother    . Diabetes Sister    . Diabetes Maternal Aunt    . Hypertension Paternal Grandmother    . No known problems Daughter    . No known problems Son    . No known problems Daughter    . No known problems Son        Outpatient Medication:  Previous Medications    ATORVASTATIN  CALCIUM PO    Take by mouth.    CLOTRIMAZOLE-BETAMETHASONE (LOTRISONE) CREAM    Apply topically 2 (two) times daily.    DIFLUNISAL (DOLOBID) 500 MG TAB TABLET    Take 1 tablet (500 mg total) by mouth 2 (two) times daily as needed (When necessary pain).    GABAPENTIN PO    Take by mouth.    HYDROMORPHONE (DILAUDID) 2 MG TABLET    Take 1 tablet (2 mg total) by mouth every 6 (six) hours as needed for Pain (As needed for pain).    INSULIN GLARGINE (LANTUS SC)    Inject 45 Units into the skin.       LIRAGLUTIDE (VICTOZA SC)    Inject into the skin.    LISINOPRIL (PRINIVIL,ZESTRIL) 20 MG TABLET    Take 20 mg by mouth  daily.    METFORMIN (GLUCOPHAGE) 500 MG TABLET    Take 2 tablets (1,000 mg total) by mouth 2 (two) times daily with meals.    NYSTATIN (MYCOSTATIN) CREAM    Apply topically 2 (two) times daily.    TRAMADOL (ULTRAM) 50 MG TABLET    Take 1 tablet (50 mg total) by mouth every 6 (six) hours as needed for Pain. Do not drive or operate machinery while taking this medication    VITAMIN D, ERGOCALCIFEROL, PO    Take by mouth.       Allergies:  Allergies   Allergen Reactions   . Latex    . Morphine    . Percocet [Oxycodone-Acetaminophen]      Unknown reaction     . Tape          REVIEW OF SYSTEMS   Review of Systems   Musculoskeletal:        Left ankle and foot pain   Neurological: Negative for sensory change and focal weakness.   All other systems reviewed and are negative.        PHYSICAL EXAM     Filed Vitals:    03/12/15 1753   BP: 152/92   Pulse: 98   Temp: 98.1 F (36.7 C)   Resp: 16   SpO2: 99%       Nursing note and vitals reviewed.    Constitutional: non-toxic appearing  Head: Atraumatic.  Eyes: No scleral icterus.  ENT: Mucous membranes are moist and intact.   Neck: Supple.  Pulmonary/Chest: No evidence of respiratory distress.   GI: Soft, non-distended abdomen.   Extremities:   Left ankle- no deformity, anterior-lateral tenderness, normal rom, normal strength, left foot without deformity or tenderness, 2+  dp pulse, wiggles toes, sensation intact foot, no medial tenderness  Skin: No rash.   Neurological: Awake, alert and oriented x 3.   Psychiatric: Appropriate affect. Appropriate mood. Appropriate behavior.    MEDICAL DECISION MAKING   X-rays left ankle to rule out fx  X-rays reviewed. Avulsion fragment near tip of medial malleolus. This is not where pain is located. She is not tender here. This could be from an old injury.  Ace wrap being applied to left ankle.  EMS told me that patient walked without any difficulties.   Patient requests crutches. Will provide these with instructions.  Discussed plan. Discharged home.    Discharge Instructions:  Left lateral ankle sprain.  There is a small bone fragment on the inside of your left ankle which is likely from an old injury. You are not having any pain at this site. It is causing you no problem.  Take Ibuprofen for pain.  Ice left ankle for pain, swelling.  Wear ace wrap for pain, swelling.  Follow up with Dr. Mare Ferrari in the next week.  Return to the ER immediately for new/worsening illness.    DISCUSSION      Vital Signs: Reviewed the patient?s vital signs.   Nursing Notes: Reviewed and utilized available nursing notes.  Medical Records Reviewed: Reviewed available past medical records.  Counseling: The emergency provider has spoken with the patient and discussed today?s findings, in addition to providing specific details for the plan of care.  Questions are answered and there is agreement with the plan.    IMAGING STUDIES    The following imaging studies were independently interpreted by the Emergency Medicine Physician.  For full imaging study results please see chart.    CARDIAC STUDIES  The following cardiac studies were independently interpreted by the Emergency Medicine Physician. For full cardiac study results please see chart     PULSE OXIMETRY        EMERGENCY DEPT. MEDICATIONS      ED Medication Orders     Start Ordered     Status Ordering Provider     03/12/15 1818 03/12/15 1817  diphenhydrAMINE (BENADRYL) capsule 25 mg   Once     Route: Oral  Ordered Dose: 25 mg     Acknowledged [name] [facility]    03/12/15 1800 03/12/15 1759  ibuprofen (ADVIL,MOTRIN) tablet 600 mg   Once     Route: Oral  Ordered Dose: 600 mg     Last MAR action:  Given Maury Groninger [place]          LABORATORY RESULTS    Ordered and independently interpreted AVAILABLE laboratory tests. Please see results section in chart for full details.      CONSULTATIONS        CRITICAL CARE        ATTESTATIONS        Physician Attestation: Darlyn Read MD, have been the primary provider for Dorothy [name] during this Emergency Dept visit and have reviewed the chart for accuracy and agree with its content.       DIAGNOSIS      Diagnosis:  Final diagnoses:   Sprain of other ligament of left ankle, initial encounter       Disposition:  ED Disposition     Discharge Dorothy [name] discharge to home/self care.    Condition at disposition: Stable            Prescriptions:  Patient's Medications   New Prescriptions    IBUPROFEN (ADVIL,MOTRIN) 600 MG TABLET    Take 1 tablet (600 mg total) by mouth every 6 (six) hours as needed for Pain or Fever.   Previous Medications    ATORVASTATIN CALCIUM PO    Take by mouth.    CLOTRIMAZOLE-BETAMETHASONE (LOTRISONE) CREAM    Apply topically 2 (two) times daily.    DIFLUNISAL (DOLOBID) 500 MG TAB TABLET    Take 1 tablet (500 mg total) by mouth 2 (two) times daily as needed (When necessary pain).    GABAPENTIN PO    Take by mouth.    HYDROMORPHONE (DILAUDID) 2 MG TABLET    Take 1 tablet (2 mg total) by mouth every 6 (six) hours as needed for Pain (As needed for pain).    INSULIN GLARGINE (LANTUS SC)    Inject 45 Units into the skin.       LIRAGLUTIDE (VICTOZA SC)    Inject into the skin.    LISINOPRIL (PRINIVIL,ZESTRIL) 20 MG TABLET    Take 20 mg by mouth daily.    METFORMIN (GLUCOPHAGE) 500 MG TABLET    Take 2 tablets (1,000 mg total) by mouth 2 (two)  times daily with meals.    NYSTATIN (MYCOSTATIN) CREAM    Apply topically 2 (two) times daily.    TRAMADOL (ULTRAM) 50 MG TABLET    Take 1 tablet (50 mg total) by mouth every 6 (six) hours as needed for Pain. Do not drive or operate machinery while taking this medication    VITAMIN D, ERGOCALCIFEROL, PO    Take by mouth.   Modified Medications    No medications on file   Discontinued Medications    IBUPROFEN (ADVIL,MOTRIN) 600 MG TABLET    Take 1 tablet (600  mg total) by mouth every 6 (six) hours as needed for Pain.           Marland Mcalpine, MD  03/12/15 [phone]

## 2015-03-12 NOTE — ED Notes (Signed)
Patient here for eval of left ankle pain.  States she fell in July, and pain returned a few days ago because of the weather.  Alert and oriented x 3.  Pedal pulses present,  No deformity or bruising.  No swelling noted.  Ambulatory for EMS.

## 2015-03-12 NOTE — Discharge Instructions (Signed)
Left lateral ankle sprain.  There is a small bone fragment on the inside of your left ankle which is likely from an old injury. You are not having any pain at this site. It is causing you no problem.  Take Ibuprofen for pain.  Ice left ankle for pain, swelling.  Wear ace wrap for pain, swelling.  Follow up with Dr. Mare Ferrari in the next week.  Return to the ER immediately for new/worsening illness.      Ankle Sprain    You have been diagnosed with an ankle sprain.    A sprain is a ligament injury, usually a tear or partial tear. Sprains can hurt as much as broken bones. Sprains can be classified by the degree of injury. A first-degree sprain is considered a minor tear. A second-degree sprain is a partial tear of the ligament. A third-degree sprain often involves a small fracture, or break, of the bone that the ligament is attached to.    Sprains are usually treated with pain medication and a splint to keep the joint from moving. You should Rest, Ice, Compress, and Elevate the injured ankle. Remember this as "RICE."   REST: Limit the use of the injured body part.   ICE: By applying ice to the affected area, swelling and pain can be reduced. Place some ice cubes in a re-sealable (Ziploc) bag and add some water. Put a thin washcloth between the bag and the skin. Apply the ice bag to the area for at least 20 minutes. Do this at least 4 times per day. Using the ice for longer times and more frequently is OK. NEVER APPLY ICE DIRECTLY TO THE SKIN.   COMPRESS: Compression means to apply pressure around the injured area such as with a splint, cast or an ACE bandage. Compression decreases swelling and improves comfort. Compression should be tight enough to relieve swelling but not so tight as to decrease circulation. Increasing pain, numbness, tingling, or change in skin color, are all signs of decreased circulation.   ELEVATE: Elevate the injured part. For example, a sprained ankle can be placed up on a chair while  sitting and propped up on pillows while lying down.    You have been given an ACE BANDAGE. The bandage will compress the ankle. This increases comfort and reduces swelling. The ACE bandage should fit snugly but not so tight as to decrease circulation (blood supply). Watch for swelling of the area outside the ACE wrap. Check capillary refill (circulation) in your toenails. To do this, press on the nail. It should turn white. When you let go, the nail should return to pink in less than 2 seconds. If it doesn't, the bandage is too tight. Loosen the wrap if you need to.    Wear the ACE bandage:   For the next 1-2 weeks.    Ankle exercises are described below. Begin the exercises as soon as you are able. They will make the ankle stronger to prevent new injuries. Do the exercises 5 to 10 times each day.   Use your big toe to draw out the letters of the alphabet on the ground. Move your ankle as you make each letter.   Sit with your leg straight out in front of you. Wrap a towel around the ball of your foot (just below your toes) and pull back. Pull hard enough to stretch the ankle. Don't pull hard enough to cause pain. Hold the stretch for 30 seconds.   Stand up. Rock onto  the tiptoes of the injured foot and then return to the flat position. Repeat 10 times.   Rotate your ankle in a circle. Make 10 clockwise circles, then make 10 circles going the other way.    YOU SHOULD SEEK MEDICAL ATTENTION IMMEDIATELY, EITHER HERE OR AT THE NEAREST EMERGENCY DEPARTMENT, IF ANY OF THE FOLLOWING OCCURS:   Your pain gets much worse.   Your ankle or foot starts to tingle or it becomes numb.   Your foot is cold or pale. This might mean there is a problem with circulation (blood supply).

## 2015-03-15 ENCOUNTER — Emergency Department: Payer: Medicare Other

## 2015-03-15 ENCOUNTER — Emergency Department
Admission: EM | Admit: 2015-03-15 | Discharge: 2015-03-15 | Disposition: A | Discharge status: Home / Self Care | Payer: Medicare Other | Attending: Emergency Medicine | Admitting: Emergency Medicine

## 2015-03-15 DIAGNOSIS — M79671 Pain in right foot: Secondary | ICD-10-CM | POA: Insufficient documentation

## 2015-03-15 DIAGNOSIS — E119 Type 2 diabetes mellitus without complications: Secondary | ICD-10-CM | POA: Insufficient documentation

## 2015-03-15 DIAGNOSIS — R21 Rash and other nonspecific skin eruption: Secondary | ICD-10-CM | POA: Insufficient documentation

## 2015-03-15 DIAGNOSIS — E7800 Pure hypercholesterolemia, unspecified: Secondary | ICD-10-CM | POA: Insufficient documentation

## 2015-03-15 DIAGNOSIS — R42 Dizziness and giddiness: Secondary | ICD-10-CM | POA: Insufficient documentation

## 2015-03-15 DIAGNOSIS — J45909 Unspecified asthma, uncomplicated: Secondary | ICD-10-CM | POA: Insufficient documentation

## 2015-03-15 DIAGNOSIS — Z794 Long term (current) use of insulin: Secondary | ICD-10-CM | POA: Insufficient documentation

## 2015-03-15 DIAGNOSIS — E669 Obesity, unspecified: Secondary | ICD-10-CM | POA: Insufficient documentation

## 2015-03-15 DIAGNOSIS — Z91040 Latex allergy status: Secondary | ICD-10-CM | POA: Insufficient documentation

## 2015-03-15 LAB — CBC AND DIFFERENTIAL
Basophils Absolute Automated: 0.03 10*3/uL (ref 0.00–0.20)
Basophils Automated: 0 %
Eosinophils Absolute Automated: 0.2 10*3/uL (ref 0.00–0.70)
Eosinophils Automated: 2 %
Hematocrit: 36.3 % — ABNORMAL LOW (ref 37.0–47.0)
Hgb: 12.5 g/dL (ref 12.0–16.0)
Immature Granulocytes Absolute: 0.01 10*3/uL
Immature Granulocytes: 0 %
Lymphocytes Absolute Automated: 3.6 10*3/uL (ref 0.50–4.40)
Lymphocytes Automated: 32 %
MCH: 26.5 pg — ABNORMAL LOW (ref 28.0–32.0)
MCHC: 34.4 g/dL (ref 32.0–36.0)
MCV: 77.1 fL — ABNORMAL LOW (ref 80.0–100.0)
MPV: 9.9 fL (ref 9.4–12.3)
Monocytes Absolute Automated: 0.56 10*3/uL (ref 0.00–1.20)
Monocytes: 5 %
Neutrophils Absolute: 6.67 10*3/uL (ref 1.80–8.10)
Neutrophils: 60 %
Nucleated RBC: 0 /100 WBC (ref 0–1)
Platelets: 364 10*3/uL (ref 140–400)
RBC: 4.71 10*6/uL (ref 4.20–5.40)
RDW: 14 % (ref 12–15)
WBC: 11.06 10*3/uL — ABNORMAL HIGH (ref 3.50–10.80)

## 2015-03-15 LAB — BASIC METABOLIC PANEL
Anion Gap: 12 (ref 5.0–15.0)
BUN: 16 mg/dL (ref 7–19)
CO2: 21 mEq/L — ABNORMAL LOW (ref 22–29)
Calcium: 9.6 mg/dL (ref 8.5–10.5)
Chloride: 101 mEq/L (ref 100–111)
Creatinine: 0.8 mg/dL (ref 0.6–1.0)
Glucose: 225 mg/dL — ABNORMAL HIGH (ref 70–100)
Potassium: 4.1 mEq/L (ref 3.5–5.1)
Sodium: 134 mEq/L — ABNORMAL LOW (ref 136–145)

## 2015-03-15 LAB — URINALYSIS, REFLEX TO MICROSCOPIC EXAM IF INDICATED
Bilirubin, UA: NEGATIVE
Blood, UA: NEGATIVE
Glucose, UA: >=500 — AB
Ketones UA: 5 — AB
Leukocyte Esterase, UA: NEGATIVE
Nitrite, UA: NEGATIVE
Protein, UR: 100 — AB
Specific Gravity UA: 1.031 (ref 1.001–1.035)
Urine pH: 6 (ref 5.0–8.0)
Urobilinogen, UA: NEGATIVE mg/dL

## 2015-03-15 LAB — GFR: EGFR: >60

## 2015-03-15 LAB — URINE HCG QUALITATIVE: Urine HCG Qualitative: NEGATIVE

## 2015-03-15 LAB — GLUCOSE WHOLE BLOOD - POCT: Whole Blood Glucose POCT: 226 mg/dL — ABNORMAL HIGH (ref 70–100)

## 2015-03-15 MED ORDER — HYDROCORTISONE 2.5 % EX CREA
TOPICAL_CREAM | Freq: Two times a day (BID) | CUTANEOUS | Status: DC
Start: 2015-03-15 — End: 2015-03-30

## 2015-03-15 MED ORDER — KETOROLAC TROMETHAMINE 30 MG/ML IJ SOLN
30.0000 mg | Freq: Once | INTRAMUSCULAR | Status: AC
Start: 2015-03-15 — End: 2015-03-15
  Administered 2015-03-15: 30 mg via INTRAVENOUS
  Filled 2015-03-15: qty 1

## 2015-03-15 MED ORDER — HYDROCORTISONE 2.5 % EX CREA
TOPICAL_CREAM | Freq: Two times a day (BID) | CUTANEOUS | Status: DC
Start: 2015-03-15 — End: 2015-04-17

## 2015-03-15 MED ORDER — IBUPROFEN 600 MG PO TABS
600.0000 mg | ORAL_TABLET | Freq: Four times a day (QID) | ORAL | Status: DC | PRN
Start: 2015-03-15 — End: 2015-09-02

## 2015-03-15 MED ORDER — METOCLOPRAMIDE HCL 10 MG PO TABS
10.0000 mg | ORAL_TABLET | Freq: Three times a day (TID) | ORAL | Status: DC | PRN
Start: 2015-03-15 — End: 2015-03-30

## 2015-03-15 MED ORDER — IBUPROFEN 600 MG PO TABS
600.0000 mg | ORAL_TABLET | Freq: Four times a day (QID) | ORAL | Status: DC | PRN
Start: 2015-03-15 — End: 2015-03-15

## 2015-03-15 MED ORDER — METOCLOPRAMIDE HCL 5 MG/ML IJ SOLN
10.0000 mg | Freq: Once | INTRAMUSCULAR | Status: AC
Start: 2015-03-15 — End: 2015-03-15
  Administered 2015-03-15: 10 mg via INTRAVENOUS
  Filled 2015-03-15: qty 2

## 2015-03-15 MED ORDER — METOCLOPRAMIDE HCL 10 MG PO TABS
10.0000 mg | ORAL_TABLET | Freq: Three times a day (TID) | ORAL | Status: DC | PRN
Start: 2015-03-15 — End: 2015-03-15

## 2015-03-15 MED ORDER — SODIUM CHLORIDE 0.9 % IV BOLUS
1000.0000 mL | Freq: Once | INTRAVENOUS | Status: AC
Start: 2015-03-15 — End: 2015-03-15
  Administered 2015-03-15: 1000 mL via INTRAVENOUS

## 2015-03-15 NOTE — ED Provider Notes (Signed)
Physician/Midlevel provider first contact with patient: 03/15/15 1610           EMERGENCY DEPARTMENT NOTE    Physician/Midlevel provider first contact with patient: 03/15/15 0315         HISTORY OF PRESENT ILLNESS   Historian: Patient  Translator Used: None    39 y.o. female presents with lightheadedness when she arrived to the ED to visit her fiance. The patient had a headache earlier today, since resolved. Pt reports she checked her glucose today and it was 260. She has multiple other complaints including a rash beginning several days to the lower abdomen, RLE and arms with pruritis. She reports new soap without detergent, foods, facial swelling, cp, sob. In addition she reports R foot pain. Pt was seen 3 days ago [facility] and found to have an avulsion fracture. She has been using crutches and "hopping on her R foot" to compensate       MEDICAL HISTORY     Past Medical History:  Past Medical History   Diagnosis Date   . Diabetes mellitus without complication    . Asthma without status asthmaticus    . Anemia    . Depression    . Anemia    . Panic attack    . Seasonal allergies    . High cholesterol        Past Surgical History:  Past Surgical History   Procedure Laterality Date   . Cesarean section     . Cholecystectomy     . Tonsillectomy     . Hernia repair         Social History:  Social History     Social History   . Marital Status: Single     Spouse Name: N/A   . Number of Children: N/A   . Years of Education: N/A     Occupational History   . Not on file.     Social History Main Topics   . Smoking status: Never Smoker    . Smokeless tobacco: Not on file   . Alcohol Use: No   . Drug Use: No   . Sexual Activity: Not on file     Other Topics Concern   . Not on file     Social History Narrative       Family History:  Family History   Problem Relation Age of Onset   . Diabetes Mother    . Diabetes Sister    . Diabetes Maternal Aunt    . Hypertension Paternal Grandmother    . No known problems Daughter     . No known problems Son    . No known problems Daughter    . No known problems Son        Outpatient Medication:  Discharge Medication List as of 03/15/2015  5:31 AM      CONTINUE these medications which have NOT CHANGED    Details   ATORVASTATIN CALCIUM PO Take by mouth., Until Discontinued, Historical Med      clotrimazole-betamethasone (LOTRISONE) cream Apply topically 2 (two) times daily., Starting 08/06/2013, Until Discontinued, Print      diflunisal (DOLOBID) 500 MG Tab tablet Take 1 tablet (500 mg total) by mouth 2 (two) times daily as needed (When necessary pain)., Starting 11/07/2014, Until Discontinued, Print      GABAPENTIN PO Take by mouth., Until Discontinued, Historical Med      HYDROmorphone (DILAUDID) 2 MG tablet Take 1 tablet (2 mg total) by  mouth every 6 (six) hours as needed for Pain (As needed for pain)., Starting 08/11/2014, Until Discontinued, Print      Insulin Glargine (LANTUS SC) Inject 45 Units into the skin.   , Until Discontinued, Historical Med      Liraglutide (VICTOZA SC) Inject into the skin., Until Discontinued, Historical Med      lisinopril (PRINIVIL,ZESTRIL) 20 MG tablet Take 20 mg by mouth daily., Until Discontinued, Historical Med      metFORMIN (GLUCOPHAGE) 500 MG tablet Take 2 tablets (1,000 mg total) by mouth 2 (two) times daily with meals., Starting 07/02/2013, Until Discontinued, Print      nystatin (MYCOSTATIN) cream Apply topically 2 (two) times daily., Starting 08/06/2013, Until Discontinued, Print      traMADol (ULTRAM) 50 MG tablet Take 1 tablet (50 mg total) by mouth every 6 (six) hours as needed for Pain. Do not drive or operate machinery while taking this medication, Starting 11/06/2014, Until Discontinued, Print      VITAMIN D, ERGOCALCIFEROL, PO Take by mouth., Until Discontinued, Historical Med             Allergies:  Allergies   Allergen Reactions   . Latex    . Morphine    . Percocet [Oxycodone-Acetaminophen]      Unknown reaction     . Tape          REVIEW OF  SYSTEMS   Review of Systems   Constitutional: Negative for fever and chills.   Respiratory: Negative for cough.    Cardiovascular: Negative for chest pain.   Gastrointestinal: Negative for abdominal pain.   Genitourinary: Negative for dysuria.   Musculoskeletal: Positive for joint pain.   Skin: Positive for itching and rash.   Neurological: Positive for dizziness. Negative for headaches.   All other systems reviewed and are negative.        PHYSICAL EXAM     Filed Vitals:    03/15/15 0303 03/15/15 0601   BP: 146/107 135/70   Pulse: 93 75   Temp: 98.2 F (36.8 C)    Resp: 16 18   SpO2: 96% 100%         Nursing note and vitals reviewed.  Constitutional:  Well developed, well nourished. Awake & Oriented x3. Obese female  Head:  Atraumatic. Normocephalic.    Eyes:  PERRL. EOMI. Conjunctivae are not pale.  ENT:  Mucous membranes are moist and intact. Oropharynx is clear and symmetric.  Patent airway.  Neck:  Supple. Full ROM.    Cardiovascular:  Regular rate. Regular rhythm. No murmurs, rubs, or gallops.  Pulmonary/Chest:  No evidence of respiratory distress. Clear to auscultation bilaterally.  No wheezing, rales or rhonchi.   Abdominal:  Soft and non-distended. There is no tenderness. No rebound, guarding, or rigidity.  Extremities:  No edema. No cyanosis. No clubbing. Full range of motion in all extremities.  Skin:  Skin is warm and dry.  No diaphoresis. Papules to lower abdomen, extremities with excoriation   Neurological:  Alert, awake, and appropriate. Normal speech. Motor normal. CN 2-12 intact No facial droop Steady gait  Psychiatric:  Good eye contact. Normal interaction, affect, and behavior.        MEDICAL DECISION MAKING       On exam pt has a normal neuro exam. Asymptomatic prior to arrival to the ED to see her fiance. Now with multiple complaints. Rash appears benign with mild excoriation. Given hydrocortisone cream. IVF given for dizziness. Walked in with steady gait. CN  2-12 intact. No focal weakness.  UA/UCG negative. She had a prior headache, now resolved. No sudden onset, neck pain/stiffness, f/c. XR of her foot is negative. Adivsed to follow with her pmd. Stable for discharge home    DISCUSSION      Vital Signs: Reviewed the patient?s vital signs.   Nursing Notes: Reviewed and utilized available nursing notes.  Medical Records Reviewed: Reviewed available past medical records.  Counseling: The emergency provider has spoken with the patient and discussed today?s findings, in addition to providing specific details for the plan of care.  Questions are answered and there is agreement with the plan.    IMAGING STUDIES    The following imaging studies were independently interpreted by the Emergency Medicine Physician.  For full imaging study results please see chart.    CARDIAC STUDIES     The following cardiac studies were independently interpreted by the Emergency Medicine Physician. For full cardiac study results please see chart     Monitor Strip   Interpreted by ED Physician   Rate: 74  Rhythm: NSR  ST Changes: None        PULSE OXIMETRY    Oxygen Saturation by Pulse Oximetry: 100%  Interventions:   Interpretation: Normal    EMERGENCY DEPT. MEDICATIONS      ED Medication Orders     Start Ordered     Status Ordering Provider    03/15/15 [phone] 03/15/15 0316  sodium chloride 0.9 % bolus 1,000 mL   Once     Route: Intravenous  Ordered Dose: 1,000 mL     Last MAR action:  Stopped Raliegh Ip    03/15/15 9629 03/15/15 0316  metoclopramide (REGLAN) injection 10 mg   Once     Route: Intravenous  Ordered Dose: 10 mg     Last MAR action:  Given Raliegh Ip    03/15/15 0317 03/15/15 0316  ketorolac (TORADOL) injection 30 mg   Once     Route: Intravenous  Ordered Dose: 30 mg     Last MAR action:  Given Latiana Tomei YVONNE          LABORATORY RESULTS    Ordered and independently interpreted AVAILABLE laboratory tests. Please see results section in chart for full details.  Results for orders placed or  performed during the hospital encounter of 03/15/15   UA, Reflex to Microscopic   Result Value Ref Range    Urine Type Clean Catch     Color, UA Yellow Clear - Yellow    Clarity, UA Sl Cloudy (A) Clear - Hazy    Specific Gravity UA 1.031 1.001-1.035    Urine pH 6.0 5.0-8.0    Leukocyte Esterase, UA Negative Negative    Nitrite, UA Negative Negative    Protein, UR 100 (A) Negative    Glucose, UA >=500 (A) Negative    Ketones UA 5 (A) Negative    Urobilinogen, UA Negative 0.2  -  2.0 mg/dL    Bilirubin, UA Negative Negative    Blood, UA Negative Negative    RBC, UA 0 - 5 0 - 5 /hpf    WBC, UA 0 - 5 0 - 5 /hpf    Squamous Epithelial Cells, Urine 0 - 5 0 - 25 /hpf   Urine HCG Qualitative   Result Value Ref Range    Urine HCG Qualitative Negative Negative   CBC with differential   Result Value Ref Range    WBC 11.06 (H) 3.50 - 10.80  x10 3/uL    Hgb 12.5 12.0 - 16.0 g/dL    Hematocrit 16.1 (L) 37.0 - 47.0 %    Platelets 364 140 - 400 x10 3/uL    RBC 4.71 4.20 - 5.40 x10 6/uL    MCV 77.1 (L) 80.0 - 100.0 fL    MCH 26.5 (L) 28.0 - 32.0 pg    MCHC 34.4 32.0 - 36.0 g/dL    RDW 14 12 - 15 %    MPV 9.9 9.4 - 12.3 fL    Neutrophils 60 None %    Lymphocytes Automated 32 None %    Monocytes 5 None %    Eosinophils Automated 2 None %    Basophils Automated 0 None %    Immature Granulocyte 0 None %    Nucleated RBC 0 0 - 1 /100 WBC    Neutrophils Absolute 6.67 1.80 - 8.10 x10 3/uL    Abs Lymph Automated 3.60 0.50 - 4.40 x10 3/uL    Abs Mono Automated 0.56 0.00 - 1.20 x10 3/uL    Abs Eos Automated 0.20 0.00 - 0.70 x10 3/uL    Absolute Baso Automated 0.03 0.00 - 0.20 x10 3/uL    Absolute Immature Granulocyte 0.01 0 x10 3/uL   Basic Metabolic Panel (BMP)   Result Value Ref Range    Glucose 225 (H) 70 - 100 mg/dL    BUN 16 7 - 19 mg/dL    Creatinine 0.8 0.6 - 1.0 mg/dL    Calcium 9.6 8.5 - 09.6 mg/dL    Sodium 045 (L) 409 - 145 mEq/L    Potassium 4.1 3.5 - 5.1 mEq/L    Chloride 101 100 - 111 mEq/L    CO2 21 (L) 22 - 29 mEq/L    Anion  Gap 12.0 5.0 - 15.0   GFR   Result Value Ref Range    EGFR >60.0    Glucose Whole Blood - POCT   Result Value Ref Range    POCT - Glucose Whole blood 226 (H) 70 - 100 mg/dL       CONSULTATIONS        CRITICAL CARE        ATTESTATIONS        Physician Attestation: I, Dr Marnee Spring DO, have been the primary provider for Dorothy [name] during this Emergency Dept visit and have reviewed the chart for accuracy and agree with its content.       DIAGNOSIS      Diagnosis:  Final diagnoses:   Dizziness   Rash and nonspecific skin eruption   Right foot pain       Disposition:  ED Disposition     Discharge MILEE QUALLS discharge to home/self care.    Condition at disposition: Stable            Prescriptions:  Discharge Medication List as of 03/15/2015  5:31 AM      START taking these medications    Details   hydrocortisone 2.5 % cream Apply topically 2 (two) times daily., Starting 03/15/2015, Until Discontinued, Print      metoclopramide (REGLAN) 10 MG tablet Take 1 tablet (10 mg total) by mouth 3 (three) times daily as needed (nausea, vomiting, headache)., Starting 03/15/2015, Until Discontinued, Print         CONTINUE these medications which have CHANGED    Details   ibuprofen (ADVIL,MOTRIN) 600 MG tablet Take 1 tablet (600 mg total) by mouth every 6 (six) hours as needed  for Pain or Fever., Starting 03/15/2015, Until Discontinued, Print         CONTINUE these medications which have NOT CHANGED    Details   ATORVASTATIN CALCIUM PO Take by mouth., Until Discontinued, Historical Med      clotrimazole-betamethasone (LOTRISONE) cream Apply topically 2 (two) times daily., Starting 08/06/2013, Until Discontinued, Print      diflunisal (DOLOBID) 500 MG Tab tablet Take 1 tablet (500 mg total) by mouth 2 (two) times daily as needed (When necessary pain)., Starting 11/07/2014, Until Discontinued, Print      GABAPENTIN PO Take by mouth., Until Discontinued, Historical Med      HYDROmorphone (DILAUDID) 2 MG tablet Take 1  tablet (2 mg total) by mouth every 6 (six) hours as needed for Pain (As needed for pain)., Starting 08/11/2014, Until Discontinued, Print      Insulin Glargine (LANTUS SC) Inject 45 Units into the skin.   , Until Discontinued, Historical Med      Liraglutide (VICTOZA SC) Inject into the skin., Until Discontinued, Historical Med      lisinopril (PRINIVIL,ZESTRIL) 20 MG tablet Take 20 mg by mouth daily., Until Discontinued, Historical Med      metFORMIN (GLUCOPHAGE) 500 MG tablet Take 2 tablets (1,000 mg total) by mouth 2 (two) times daily with meals., Starting 07/02/2013, Until Discontinued, Print      nystatin (MYCOSTATIN) cream Apply topically 2 (two) times daily., Starting 08/06/2013, Until Discontinued, Print      traMADol (ULTRAM) 50 MG tablet Take 1 tablet (50 mg total) by mouth every 6 (six) hours as needed for Pain. Do not drive or operate machinery while taking this medication, Starting 11/06/2014, Until Discontinued, Print      VITAMIN D, ERGOCALCIFEROL, PO Take by mouth., Until Discontinued, Historical Med               Raliegh Ip, DO  03/16/15 [phone]

## 2015-03-15 NOTE — Discharge Instructions (Signed)
Dizziness, Nonspecific    You have been seen for dizziness.     Dizziness can mean different things to different people. Some people use dizziness to mean the feeling of spinning when there is no actual movement. This often causes nausea (feeling sick). The medical term for this is "vertigo." Others people use the word dizzy to mean "feeling lightheaded," like you might faint. This feeling is usually made better when lying down. For some people, neither of these describes how they are feeling. It can just be a feeling that makes you unsteady. This feeling is common in older people. It can be caused by a number of things. These include poor vision or hearing, foot problems and arthritis. It can also be caused by middle ear or sinus problems. The feeling can come and go.    Dizziness is also caused by more serious things. This includes strokes and heart problems.    It is NEVER normal to have the kind of dizziness you have today together with:   Chest pain.   Problems walking because of problems with balance. Especially if you are falling to one side.   Weakness, numbness or tingling in a part of your body.   Drooping of one side of your face.   Confusion.   Severe headache.   Problems speaking.    If you have these symptoms, it is VERY IMPORTANT to go to the nearest emergency department.    Your tests today were negative (normal). This means we found no life-threatening causes for your dizziness. It is OK for you to go home.    See your primary care doctor for more work-up of your dizziness.     YOU SHOULD SEEK MEDICAL ATTENTION IMMEDIATELY, EITHER HERE OR AT THE NEAREST EMERGENCY DEPARTMENT, IF ANY OF THE FOLLOWING OCCUR:   You cannot speak clearly (slurring), one side of your face droops or you feel weak in the arms or legs (especially on one side).   You have problems with your balance.   You have problems hearing or there is ringing or a feeling of fullness in your ear.   You lose consciousness  ("pass out" or faint).   You have severe headache with dizziness.   You have fever (temperature higher than 100.4F / 38C).   You fall and hit your head.

## 2015-03-15 NOTE — ED Notes (Signed)
Patient states she has to use the bathroom and requests bedpan because she is on crutches,  I say "I will be back with help" she states it is ok for me to help her because she trusts me and I "won't try anything".

## 2015-03-15 NOTE — ED Notes (Signed)
Before I can return to the patient I see the patient very quickly using the crutches and racing to her room.

## 2015-03-15 NOTE — ED Notes (Signed)
Patient urinated all over the bed.

## 2015-03-15 NOTE — ED Notes (Signed)
Patient said felt like she was going to vomit and void her bowels.  Patient became angry with me when I told her we have to go to the bathroom instead of her using the bathroom in her bed.  Patient requests wheel chair because she does not want to use crutches.  I took the patient to the bathroom with crutches and told her I would return.

## 2015-03-30 ENCOUNTER — Emergency Department
Admission: EM | Admit: 2015-03-30 | Discharge: 2015-03-30 | Disposition: A | Discharge status: Home / Self Care | Payer: Medicare Other | Attending: Emergency Medicine | Admitting: Emergency Medicine

## 2015-03-30 ENCOUNTER — Emergency Department: Payer: Medicare Other

## 2015-03-30 ENCOUNTER — Emergency Department
Admission: EM | Admit: 2015-03-30 | Discharge: 2015-03-30 | Disposition: A | Discharge status: Home / Self Care | Payer: Medicare Other | Source: Home / Self Care | Attending: Emergency Medicine | Admitting: Emergency Medicine

## 2015-03-30 DIAGNOSIS — S46911A Strain of unspecified muscle, fascia and tendon at shoulder and upper arm level, right arm, initial encounter: Secondary | ICD-10-CM | POA: Insufficient documentation

## 2015-03-30 DIAGNOSIS — Z91148 Patient's other noncompliance with medication regimen for other reason: Secondary | ICD-10-CM

## 2015-03-30 DIAGNOSIS — J45909 Unspecified asthma, uncomplicated: Secondary | ICD-10-CM | POA: Insufficient documentation

## 2015-03-30 DIAGNOSIS — Z794 Long term (current) use of insulin: Secondary | ICD-10-CM | POA: Insufficient documentation

## 2015-03-30 DIAGNOSIS — X58XXXA Exposure to other specified factors, initial encounter: Secondary | ICD-10-CM | POA: Insufficient documentation

## 2015-03-30 DIAGNOSIS — Z9114 Patient's other noncompliance with medication regimen: Secondary | ICD-10-CM | POA: Insufficient documentation

## 2015-03-30 DIAGNOSIS — E1165 Type 2 diabetes mellitus with hyperglycemia: Secondary | ICD-10-CM | POA: Insufficient documentation

## 2015-03-30 DIAGNOSIS — R21 Rash and other nonspecific skin eruption: Secondary | ICD-10-CM | POA: Insufficient documentation

## 2015-03-30 DIAGNOSIS — IMO0001 Reserved for inherently not codable concepts without codable children: Secondary | ICD-10-CM

## 2015-03-30 DIAGNOSIS — E7800 Pure hypercholesterolemia, unspecified: Secondary | ICD-10-CM | POA: Insufficient documentation

## 2015-03-30 DIAGNOSIS — R739 Hyperglycemia, unspecified: Secondary | ICD-10-CM

## 2015-03-30 LAB — URINALYSIS, REFLEX TO MICROSCOPIC EXAM IF INDICATED
Bilirubin, UA: NEGATIVE
Blood, UA: NEGATIVE
Glucose, UA: >=500 — AB
Ketones UA: NEGATIVE
Leukocyte Esterase, UA: NEGATIVE
Nitrite, UA: NEGATIVE
Protein, UR: NEGATIVE
Specific Gravity UA: 1.026 (ref 1.001–1.035)
Urine pH: 6 (ref 5.0–8.0)
Urobilinogen, UA: NEGATIVE mg/dL

## 2015-03-30 LAB — CBC AND DIFFERENTIAL
Basophils Absolute Automated: 0.03 10*3/uL (ref 0.00–0.20)
Basophils Automated: 0 %
Eosinophils Absolute Automated: 0.18 10*3/uL (ref 0.00–0.70)
Eosinophils Automated: 2 %
Hematocrit: 35.8 % — ABNORMAL LOW (ref 37.0–47.0)
Hgb: 12.5 g/dL (ref 12.0–16.0)
Immature Granulocytes Absolute: 0.01 10*3/uL
Immature Granulocytes: 0 %
Lymphocytes Absolute Automated: 2.37 10*3/uL (ref 0.50–4.40)
Lymphocytes Automated: 30 %
MCH: 26.9 pg — ABNORMAL LOW (ref 28.0–32.0)
MCHC: 34.9 g/dL (ref 32.0–36.0)
MCV: 77 fL — ABNORMAL LOW (ref 80.0–100.0)
MPV: 9.9 fL (ref 9.4–12.3)
Monocytes Absolute Automated: 0.37 10*3/uL (ref 0.00–1.20)
Monocytes: 5 %
Neutrophils Absolute: 4.86 10*3/uL (ref 1.80–8.10)
Neutrophils: 62 %
Nucleated RBC: 0 /100 WBC (ref 0–1)
Platelets: 352 10*3/uL (ref 140–400)
RBC: 4.65 10*6/uL (ref 4.20–5.40)
RDW: 14 % (ref 12–15)
WBC: 7.81 10*3/uL (ref 3.50–10.80)

## 2015-03-30 LAB — COMPREHENSIVE METABOLIC PANEL
ALT: 18 U/L (ref 0–55)
AST (SGOT): 14 U/L (ref 5–34)
Albumin/Globulin Ratio: 1 (ref 0.9–2.2)
Albumin: 3.6 g/dL (ref 3.5–5.0)
Alkaline Phosphatase: 110 U/L — ABNORMAL HIGH (ref 37–106)
Anion Gap: 12 (ref 5.0–15.0)
BUN: 12 mg/dL (ref 7–19)
Bilirubin, Total: 0.3 mg/dL (ref 0.2–1.2)
CO2: 22 mEq/L (ref 22–29)
Calcium: 9.6 mg/dL (ref 8.5–10.5)
Chloride: 97 mEq/L — ABNORMAL LOW (ref 100–111)
Creatinine: 0.9 mg/dL (ref 0.6–1.0)
Globulin: 3.7 g/dL — ABNORMAL HIGH (ref 2.0–3.6)
Glucose: 512 mg/dL (ref 70–100)
Potassium: 4.3 mEq/L (ref 3.5–5.1)
Protein, Total: 7.3 g/dL (ref 6.0–8.3)
Sodium: 131 mEq/L — ABNORMAL LOW (ref 136–145)

## 2015-03-30 LAB — GLUCOSE WHOLE BLOOD - POCT
Whole Blood Glucose POCT: 451 mg/dL — ABNORMAL HIGH (ref 70–100)
Whole Blood Glucose POCT: 486 mg/dL — ABNORMAL HIGH (ref 70–100)

## 2015-03-30 LAB — GFR: EGFR: >60

## 2015-03-30 LAB — TROPONIN I: Troponin I: <0.01 ng/mL (ref 0.00–0.09)

## 2015-03-30 LAB — URINE HCG QUALITATIVE: Urine HCG Qualitative: NEGATIVE

## 2015-03-30 MED ORDER — SODIUM CHLORIDE 0.9 % IV BOLUS
1000.0000 mL | Freq: Once | INTRAVENOUS | Status: DC
Start: 2015-03-30 — End: 2015-03-30

## 2015-03-30 MED ORDER — SODIUM CHLORIDE 0.9 % IV BOLUS
1000.0000 mL | Freq: Once | INTRAVENOUS | Status: AC
Start: 2015-03-30 — End: 2015-03-30
  Administered 2015-03-30: 1000 mL via INTRAVENOUS

## 2015-03-30 MED ORDER — INSULIN NPH (HUMAN) (ISOPHANE) 100 UNIT/ML SC SUSP
5.0000 [IU] | Freq: Once | SUBCUTANEOUS | Status: AC
Start: 2015-03-30 — End: 2015-03-30
  Administered 2015-03-30: 5 [IU] via SUBCUTANEOUS
  Filled 2015-03-30: qty 15

## 2015-03-30 MED ORDER — METFORMIN HCL 500 MG PO TABS
500.0000 mg | ORAL_TABLET | Freq: Two times a day (BID) | ORAL | Status: DC
Start: 2015-03-30 — End: 2015-04-17

## 2015-03-30 MED ORDER — INSULIN REGULAR HUMAN 100 UNIT/ML IJ SOLN
5.0000 [IU] | Freq: Once | INTRAMUSCULAR | Status: AC
Start: 2015-03-30 — End: 2015-03-30
  Administered 2015-03-30: 5 [IU] via INTRAVENOUS
  Filled 2015-03-30: qty 15

## 2015-03-30 NOTE — Discharge Instructions (Signed)
1. Return immediately if worse in any way.    2. Follow up with your primary medical doctor for recheck.

## 2015-03-30 NOTE — ED Notes (Signed)
Pt continues to request for dilaudid

## 2015-03-30 NOTE — ED Notes (Signed)
Pt returns via EMS c/o high blood sugar with dizziness. Checked at home machine and said it was "high high". EMS states FSBS in high 400's.    VS WNL..    On cell phone during triage.

## 2015-03-30 NOTE — ED Notes (Addendum)
Pt requesting "dilaudid" for arm pain, MD made aware

## 2015-03-30 NOTE — Discharge Instructions (Signed)
Diabetes Mellitus, Type I    You have been seen for diabetes.    There are only two things to keep your blood sugar levels under control. This is following your diet and insulin injections as prescribed. 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 Use your insulin as your regular doctor directed. Untreated diabetes can lead to heart problems and kidney problems (including kidney failure). It can also lead to stroke and blindness. Therefore, it is VERY IMPORTANT to follow up with your regular doctor.    Check your blood sugar before every meal and before bedtime. Keep a record of your blood sugars. Show it to your doctor at your next visit.    Use the sliding scale with Regular Insulin as your doctor prescribed.   Insulin lowers the blood sugar. If your blood sugar level gets too low, you may feel lightheaded. You may also sweat and even pass out. Always keep a snack food that contains sugar with you. If you begin to have these symptoms, eat the snack food to see if the symptoms get better. NEVER drive a car when feeling this way.    YOU SHOULD SEEK MEDICAL ATTENTION IMMEDIATELY, EITHER HERE OR AT THE NEAREST EMERGENCY DEPARTMENT, IF ANY OF THE FOLLOWING OCCURS:   Abdominal (belly) pain.   More than 3 vomiting (throwing up) episodes.   Blood sugar over 400 more than 3 times.   Fever (temperature higher than 100.48F / 38C) or shaking chills.   Unable to keep medicines down.   Any trouble swallowing or breathing.   Infection or rash on your skin.   Low blood sugar (less than 70) 3 or more times that does not get better immediately after eating.              Rash, Nonspecific    You have been seen today for a rash.    The rash does not have a specific appearance or cause that would let the medical staff give a definite diagnosis or treatment now.    There are many causes for rashes to appear on the skin. The medical staff will have talked about some of the many  causes. Some causes may be: Allergic reactions, chemical irritation of the skin or infections. A rash alone with no other symptoms is rarely harmful.    If you have some itching you may try an oral (by mouth) antihistamine like diphenhydramine (Benadryl). This is available over the counter (without prescription). Follow the directions and precautions on the medicine packaging.    Follow up with your family doctor or clinic for reevaluation of the rash if it does not clear up in two or three days.    Sometimes, you will need to see a Dermatologist (a skin specialist doctor) for help finding the cause of the rash.    It is OK for you to go home now.    Even though it may be hard, try not to scratch the affected area. A cool wet cloth can help relieve itching and keep you from scratching.    YOU SHOULD SEEK MEDICAL ATTENTION IMMEDIATELY, EITHER HERE OR AT THE NEAREST EMERGENCY DEPARTMENT, IF ANY OF THE FOLLOWING OCCURS:   Rash spreads or gets worse.   Severe itching, pain or burning in the skin.   Blistering, bruising or bleeding skin.   Fever (temperature higher than 100.48F / 38C), headache, vomiting (throwing up).   Any new symptoms which are of concern to  signs of infection in the skin develop. This includes more redness, pain, pus drainage, fevers (temperature higher than 100.4F / 38C) or swelling.

## 2015-03-30 NOTE — ED Notes (Signed)
Bed: E08  Expected date:   Expected time:   Means of arrival:   Comments:  Medic 424

## 2015-03-30 NOTE — ED Notes (Signed)
Patient vitals stable for discharge. Patient is A&Ox4.

## 2015-03-30 NOTE — ED Notes (Signed)
BIBA, Right arm pain since yesterday, no trauma, also states she has bug bites all over her back, arms and back. Dexi per EMS is HI

## 2015-04-01 NOTE — ED Provider Notes (Signed)
EMERGENCY DEPARTMENT NOTE    Physician/Midlevel provider first contact with patient: 03/30/15 1922         HISTORY OF PRESENT ILLNESS   Historian:Patient  Translator Used: No    Chief Complaint: Hyperglycemia     39 y.o. female presents after she took her blood sugar at home and it was elevated.  Patient has h/o DM but has not been routinely taking her medication.  No CP/SOB.  No numbness, tingling, weakness.    1. Location of symptoms: difffuse  2. Onset of symptoms: just PTA  3. What was patient doing when symptoms started (Context): see above  4. Severity: moderate  5. Timing: constant  6. Activities that worsen symptoms: none  7. Activities that improve symptoms: none  8. Quality: arm ache from previous injury but no acute pain.  9. Radiation of symptoms: no  10. Associated signs and Symptoms: see above  11. Are symptoms worsening? yes  MEDICAL HISTORY     Past Medical History:  Past Medical History   Diagnosis Date   . Diabetes mellitus without complication    . Asthma without status asthmaticus    . Anemia    . Depression    . Anemia    . Panic attack    . Seasonal allergies    . High cholesterol        Past Surgical History:  Past Surgical History   Procedure Laterality Date   . Cesarean section     . Cholecystectomy     . Tonsillectomy     . Hernia repair         Social History:  Social History     Social History   . Marital Status: Single     Spouse Name: N/A   . Number of Children: N/A   . Years of Education: N/A     Occupational History   . Not on file.     Social History Main Topics   . Smoking status: Never Smoker    . Smokeless tobacco: Not on file   . Alcohol Use: No   . Drug Use: No   . Sexual Activity: Not on file     Other Topics Concern   . Not on file     Social History Narrative       Family History:  Family History   Problem Relation Age of Onset   . Diabetes Mother    . Diabetes Sister    . Diabetes Maternal Aunt    . Hypertension Paternal Grandmother    . No known problems Daughter    . No  known problems Son    . No known problems Daughter    . No known problems Son        Outpatient Medication:  Discharge Medication List as of 03/30/2015  7:54 PM      CONTINUE these medications which have NOT CHANGED    Details   ATORVASTATIN CALCIUM PO Take by mouth., Until Discontinued, Historical Med      GABAPENTIN PO Take by mouth., Until Discontinued, Historical Med      ibuprofen (ADVIL,MOTRIN) 600 MG tablet Take 1 tablet (600 mg total) by mouth every 6 (six) hours as needed for Pain or Fever., Starting 03/15/2015, Until Discontinued, Print      Insulin Glargine (LANTUS SC) Inject 45 Units into the skin.   , Until Discontinued, Historical Med      Liraglutide (VICTOZA SC) Inject into the skin., Until Discontinued, Historical Med  lisinopril (PRINIVIL,ZESTRIL) 20 MG tablet Take 20 mg by mouth daily., Until Discontinued, Historical Med      !! metFORMIN (GLUCOPHAGE) 500 MG tablet Take 2 tablets (1,000 mg total) by mouth 2 (two) times daily with meals., Starting 07/02/2013, Until Discontinued, Print      !! metFORMIN (GLUCOPHAGE) 500 MG tablet Take 1 tablet (500 mg total) by mouth 2 (two) times daily with meals., Starting 03/30/2015, Until Discontinued, Print      VITAMIN D, ERGOCALCIFEROL, PO Take by mouth., Until Discontinued, Historical Med      !! hydrocortisone 2.5 % cream Apply topically 2 (two) times daily., Starting 03/15/2015, Until Discontinued, Print      clotrimazole-betamethasone (LOTRISONE) cream Apply topically 2 (two) times daily., Starting 08/06/2013, Until Discontinued, Print      diflunisal (DOLOBID) 500 MG Tab tablet Take 1 tablet (500 mg total) by mouth 2 (two) times daily as needed (When necessary pain)., Starting 11/07/2014, Until Discontinued, Print      !! hydrocortisone 2.5 % cream Apply topically 2 (two) times daily., Starting 03/15/2015, Until Discontinued, Print      HYDROmorphone (DILAUDID) 2 MG tablet Take 1 tablet (2 mg total) by mouth every 6 (six) hours as needed for Pain (As  needed for pain)., Starting 08/11/2014, Until Discontinued, Print      metoclopramide (REGLAN) 10 MG tablet Take 1 tablet (10 mg total) by mouth 3 (three) times daily as needed (nausea, vomiting, headache)., Starting 03/15/2015, Until Discontinued, Print      nystatin (MYCOSTATIN) cream Apply topically 2 (two) times daily., Starting 08/06/2013, Until Discontinued, Print      traMADol (ULTRAM) 50 MG tablet Take 1 tablet (50 mg total) by mouth every 6 (six) hours as needed for Pain. Do not drive or operate machinery while taking this medication, Starting 11/06/2014, Until Discontinued, Print       !! - Potential duplicate medications found. Please discuss with provider.            REVIEW OF SYSTEMS   Review of Systems   Constitutional: Negative for fever and chills.   HENT: Negative for congestion.    Eyes: Negative for blurred vision and double vision.   Respiratory: Negative for cough and shortness of breath.    Cardiovascular: Negative for chest pain and leg swelling.   Gastrointestinal: Negative for nausea, vomiting, abdominal pain and diarrhea.   Genitourinary: Negative for dysuria and hematuria.   Musculoskeletal: Negative for back pain and neck pain.   Skin: Negative for rash.   Neurological: Negative for focal weakness, loss of consciousness and headaches.   All other systems reviewed and are negative.       PHYSICAL EXAM   ED Triage Vitals   Enc Vitals Group      BP 03/30/15 1925 137/89 mmHg      Heart Rate 03/30/15 1925 85      Resp Rate 03/30/15 1925 16      Temp 03/30/15 1925 97.5 F (36.4 C)      Temp Source 03/30/15 2011 Oral      SpO2 03/30/15 1925 99 %      Weight --       Height --       Head Cir --       Peak Flow --       Pain Score 03/30/15 1925 10      Pain Loc --       Pain Edu? --       Excl. in GC? --  Constitutional:  Well developed, well nourished.  Awake & alert.    Head:  Atraumatic.  Normocephalic.    Eyes:  PERRL.  EOMI.  Conjunctivae are not pale.  ENT:  Mucous membranes are moist and  intact.  Pharynx injected, uvula midline, peritonsillar pilars are normal and symmetric, no mouth floor tenderness or swelling.  Neck:  Supple.  Full ROM.  No JVD.  Cardiovascular:  Normal S1S2. Heart rhythm is regular  Pulmonary/Chest:  No evidence of respiratory distress.  Clear to auscultation bilaterally.  No wheezing, rales or rhonchi. Chest non-tender.  Abdominal:  Soft and non-distended.  There is no tenderness.    Extremities:  No edema.   No cyanosis.  No clubbing.    Skin:  Skin is warm and dry.  No diaphoresis. No rash.   Neurological:  Alert, awake, and appropriate.  Normal speech.   Psychiatric:  Good eye contact.  Normal interaction, affect, and behavior    MEDICAL DECISION MAKING     DISCUSSION    Sugar elevated.  Patient just left ED not DKA.  Insulin given SQ in ED.  Patient has not been taking her home meds.  Will refer to PCP to discuss medication regimen.  Patient given precautions.    Will discharge home follow PCP return if worse.  Patient understands and agrees with plan.    Follow-up Information     Follow up with Benay Spice D, NP In 2 days.    Specialty:  Nurse Practitioner    Contact information:    [address] [place] [address]  [phone]            Vital Signs: Reviewed the patient?s vital signs.   Nursing Notes: Reviewed and utilized available nursing notes.  Medical Records Reviewed: Reviewed available past medical records.  Counseling: The emergency provider has spoken with the patient and discussed today?s findings, in addition to providing specific details for the plan of care.  Questions are answered and there is agreement with the plan.      IMAGING STUDIES      No orders to display         PULSE OXIMETRY    Oxygen Saturation by Pulse Oximetry: 99%  Interventions: none  Interpretation:  normal    EMERGENCY DEPT. MEDICATIONS      ED Medication Orders     Start Ordered     Status Ordering Provider    03/30/15 1955 03/30/15 1954  insulin NPH (HumuLIN,NovoLIN)  injection 5 Units   Once     Route: Subcutaneous  Ordered Dose: 5 Units     Last MAR action:  Given Daquarius Dubeau D          LABORATORY RESULTS    Ordered and independently interpreted AVAILABLE laboratory tests. Please see results section in chart for full details.  Results for orders placed or performed during the hospital encounter of 03/30/15   Glucose Whole Blood - POCT   Result Value Ref Range    POCT - Glucose Whole blood 486 (H) 70 - 100 mg/dL         DIAGNOSIS      Diagnosis:  Final diagnoses:   Hyperglycemia       Disposition:  ED Disposition     Discharge Dorothy [name] discharge to home/self care.    Condition at disposition: Stable            Prescriptions:  Discharge Medication List as of 03/30/2015  7:54 PM  CONTINUE these medications which have NOT CHANGED    Details   ATORVASTATIN CALCIUM PO Take by mouth., Until Discontinued, Historical Med      GABAPENTIN PO Take by mouth., Until Discontinued, Historical Med      ibuprofen (ADVIL,MOTRIN) 600 MG tablet Take 1 tablet (600 mg total) by mouth every 6 (six) hours as needed for Pain or Fever., Starting 03/15/2015, Until Discontinued, Print      Insulin Glargine (LANTUS SC) Inject 45 Units into the skin.   , Until Discontinued, Historical Med      Liraglutide (VICTOZA SC) Inject into the skin., Until Discontinued, Historical Med      lisinopril (PRINIVIL,ZESTRIL) 20 MG tablet Take 20 mg by mouth daily., Until Discontinued, Historical Med      !! metFORMIN (GLUCOPHAGE) 500 MG tablet Take 2 tablets (1,000 mg total) by mouth 2 (two) times daily with meals., Starting 07/02/2013, Until Discontinued, Print      !! metFORMIN (GLUCOPHAGE) 500 MG tablet Take 1 tablet (500 mg total) by mouth 2 (two) times daily with meals., Starting 03/30/2015, Until Discontinued, Print      VITAMIN D, ERGOCALCIFEROL, PO Take by mouth., Until Discontinued, Historical Med      !! hydrocortisone 2.5 % cream Apply topically 2 (two) times daily., Starting 03/15/2015, Until  Discontinued, Print      clotrimazole-betamethasone (LOTRISONE) cream Apply topically 2 (two) times daily., Starting 08/06/2013, Until Discontinued, Print      diflunisal (DOLOBID) 500 MG Tab tablet Take 1 tablet (500 mg total) by mouth 2 (two) times daily as needed (When necessary pain)., Starting 11/07/2014, Until Discontinued, Print      !! hydrocortisone 2.5 % cream Apply topically 2 (two) times daily., Starting 03/15/2015, Until Discontinued, Print      HYDROmorphone (DILAUDID) 2 MG tablet Take 1 tablet (2 mg total) by mouth every 6 (six) hours as needed for Pain (As needed for pain)., Starting 08/11/2014, Until Discontinued, Print      metoclopramide (REGLAN) 10 MG tablet Take 1 tablet (10 mg total) by mouth 3 (three) times daily as needed (nausea, vomiting, headache)., Starting 03/15/2015, Until Discontinued, Print      nystatin (MYCOSTATIN) cream Apply topically 2 (two) times daily., Starting 08/06/2013, Until Discontinued, Print      traMADol (ULTRAM) 50 MG tablet Take 1 tablet (50 mg total) by mouth every 6 (six) hours as needed for Pain. Do not drive or operate machinery while taking this medication, Starting 11/06/2014, Until Discontinued, Print       !! - Potential duplicate medications found. Please discuss with provider.            Shela Nevin, MD  04/01/15 1359

## 2015-04-02 NOTE — ED Provider Notes (Signed)
EMERGENCY DEPARTMENT NOTE    Physician/Midlevel provider first contact with patient: 03/30/15 1313         HISTORY OF PRESENT ILLNESS   Historian:Patient and ems  Translator Used: No    Chief Complaint: Arm Pain; Hyperglycemia; and Rash         39 y.o. female presents to the ER with right shoulder pain since yesterday.  Patient denies trauma, fall or injury.  Patient also complains of a rash all over her body for a few weeks. States it itches sometimes. Denies fevers. .  Patient also complains of her blood sugar being high.  States she has only been taking her metformin 500 mg twice a day instead of 1000 mg twice a day because she does not like the way it makes her feel. BS by ems was high.     1. Location of symptoms: see above  2. Onset of symptoms: shoulder pain yesterday, rash few weeks  3. What was patient doing when symptoms started (Context): see above  4. Severity: moderate  5. Timing: constant  6. Activities that worsen symptoms: movement of arm  7. Activities that improve symptoms: nothing  8. Quality: aching and sore. Rash is itchy  9. Radiation of symptoms: no  10. Associated signs and Symptoms: see above  11. Are symptoms worsening? yes  MEDICAL HISTORY     Past Medical History:  Past Medical History   Diagnosis Date   . Diabetes mellitus without complication    . Asthma without status asthmaticus    . Anemia    . Depression    . Anemia    . Panic attack    . Seasonal allergies    . High cholesterol        Past Surgical History:  Past Surgical History   Procedure Laterality Date   . Cesarean section     . Cholecystectomy     . Tonsillectomy     . Hernia repair         Social History:  Social History     Social History   . Marital Status: Single     Spouse Name: N/A   . Number of Children: N/A   . Years of Education: N/A     Occupational History   . Not on file.     Social History Main Topics   . Smoking status: Never Smoker    . Smokeless tobacco: Not on file   . Alcohol Use: No   . Drug Use: No   .  Sexual Activity: Not on file     Other Topics Concern   . Not on file     Social History Narrative       Family History:  Family History   Problem Relation Age of Onset   . Diabetes Mother    . Diabetes Sister    . Diabetes Maternal Aunt    . Hypertension Paternal Grandmother    . No known problems Daughter    . No known problems Son    . No known problems Daughter    . No known problems Son        Outpatient Medication:  Discharge Medication List as of 03/30/2015  3:12 PM      CONTINUE these medications which have NOT CHANGED    Details   ATORVASTATIN CALCIUM PO Take by mouth., Until Discontinued, Historical Med      GABAPENTIN PO Take by mouth., Until Discontinued, Historical Med      !!  hydrocortisone 2.5 % cream Apply topically 2 (two) times daily., Starting 03/15/2015, Until Discontinued, Print      ibuprofen (ADVIL,MOTRIN) 600 MG tablet Take 1 tablet (600 mg total) by mouth every 6 (six) hours as needed for Pain or Fever., Starting 03/15/2015, Until Discontinued, Print      Insulin Glargine (LANTUS SC) Inject 45 Units into the skin.   , Until Discontinued, Historical Med      Liraglutide (VICTOZA SC) Inject into the skin., Until Discontinued, Historical Med      lisinopril (PRINIVIL,ZESTRIL) 20 MG tablet Take 20 mg by mouth daily., Until Discontinued, Historical Med      !! metFORMIN (GLUCOPHAGE) 500 MG tablet Take 2 tablets (1,000 mg total) by mouth 2 (two) times daily with meals., Starting 07/02/2013, Until Discontinued, Print      VITAMIN D, ERGOCALCIFEROL, PO Take by mouth., Until Discontinued, Historical Med      clotrimazole-betamethasone (LOTRISONE) cream Apply topically 2 (two) times daily., Starting 08/06/2013, Until Discontinued, Print      diflunisal (DOLOBID) 500 MG Tab tablet Take 1 tablet (500 mg total) by mouth 2 (two) times daily as needed (When necessary pain)., Starting 11/07/2014, Until Discontinued, Print      !! hydrocortisone 2.5 % cream Apply topically 2 (two) times daily., Starting  03/15/2015, Until Discontinued, Print      HYDROmorphone (DILAUDID) 2 MG tablet Take 1 tablet (2 mg total) by mouth every 6 (six) hours as needed for Pain (As needed for pain)., Starting 08/11/2014, Until Discontinued, Print      metoclopramide (REGLAN) 10 MG tablet Take 1 tablet (10 mg total) by mouth 3 (three) times daily as needed (nausea, vomiting, headache)., Starting 03/15/2015, Until Discontinued, Print      nystatin (MYCOSTATIN) cream Apply topically 2 (two) times daily., Starting 08/06/2013, Until Discontinued, Print      traMADol (ULTRAM) 50 MG tablet Take 1 tablet (50 mg total) by mouth every 6 (six) hours as needed for Pain. Do not drive or operate machinery while taking this medication, Starting 11/06/2014, Until Discontinued, Print       !! - Potential duplicate medications found. Please discuss with provider.            REVIEW OF SYSTEMS   Review of Systems   Musculoskeletal: Positive for joint pain.        Right shoulder pain   Skin: Positive for itching and rash.   Endo/Heme/Allergies:        High blood sugar   All other systems reviewed and are negative.      PHYSICAL EXAM   ED Triage Vitals   Enc Vitals Group      BP 03/30/15 1221 98/70 mmHg      Heart Rate 03/30/15 1221 80      Resp Rate 03/30/15 1221 18      Temp 03/30/15 1221 97.1 F (36.2 C)      Temp Source 03/30/15 1221 Oral      SpO2 03/30/15 1221 100 %      Weight 03/30/15 1221 100 kg      Height --       Head Cir --       Peak Flow --       Pain Score 03/30/15 1456 10      Pain Loc --       Pain Edu? --       Excl. in GC? --      Nursing note and vitals reviewed.  Constitutional:  Well developed, well nourished. Awake & Oriented x3.  Head:  Atraumatic. Normocephalic.    Eyes:  PERRL. EOMI. Conjunctivae are not pale.  ENT:  Mucous membranes are moist and intact. Oropharynx is clear and symmetric.  Patent airway.  Neck:  Supple. Full ROM.    Cardiovascular:  Regular rate. Regular rhythm. No murmurs, rubs, or gallops.  Pulmonary/Chest:  No  evidence of respiratory distress. Clear to auscultation bilaterally.  No wheezing, rales or rhonchi.   Abdominal:  Soft and non-distended. There is no tenderness. No rebound, guarding, or rigidity. Obese.   Back:  Full ROM. Nontender.  Extremities:  No edema. No cyanosis. No clubbing. Full range of motion in all extremities. Mild tenderness to palpation right anterior shoulder.  Full range of motion.  Pain with abduction.  Full range of motion.  Right elbow and right wrist.  +2 out of 4 radial pulse, right wrist.  Sensation intact distally.  Skin:  Skin is warm and dry.  No diaphoresis. Micropapular scabs on abdomen extremities and back. No drainage. No erythema or warmth  Neurological:  Alert, awake, and appropriate. Normal speech. Motor normal.  Psychiatric:  Good eye contact. Normal interaction, affect, and behavior.        MEDICAL DECISION MAKING   IVF for hydration  Right shoulder xray to r/o fx or dislocation      Vss. Results discussed with pt. BS improving.  Instructed patient that she must take her metformin as prescribed.  Blood sugar high due to medication noncompliance.    Right shoulder pain due to strain. Instructed to wear sling for support.  Pt refused the sling.   Will d/c. Pt instructed to follow up with pcp.  Instructed to return for worsening symptoms. Pt is upset because demanding dilaudid and I refused to give her dilaudid. Pt states " I wasted my time here!" I am going to [facility] so I can get some dilaudid."    Rash is unspecified rash. Could be due to medications, itching, allergic. Less likely due scabies or bedbugs.   DISCUSSION      Vital Signs: Reviewed the patient?s vital signs.   Nursing Notes: Reviewed and utilized available nursing notes.  Medical Records Reviewed: Reviewed available past medical records.  Counseling: The emergency provider has spoken with the patient and discussed today?s findings, in addition to providing specific details for the plan of care.  Questions  are answered and there is agreement with the plan.          IMAGING STUDIES    The following imaging studies were independently interpreted by the Emergency Medicine Physician.  For full imaging study results please see chart.        PULSE OXIMETRY    Oxygen Saturation by Pulse Oximetry: 99%  Interventions: none  Interpretation:  nml    EMERGENCY DEPT. MEDICATIONS      ED Medication Orders     Start Ordered     Status Ordering Provider    03/30/15 1508 03/30/15 1507  insulin regular (HumuLIN R,NovoLIN R) injection 5 Units   Once     Route: Intravenous  Ordered Dose: 5 Units     Last MAR action:  Given Loetta Rough    03/30/15 1338 03/30/15 1337  sodium chloride 0.9 % bolus 1,000 mL   Once     Route: Intravenous  Ordered Dose: 1,000 mL     Last MAR action:  Roxine Caddy    03/30/15 1315 03/30/15 1314  Once,   Status:  Discontinued     Route: Intravenous  Ordered Dose: 1,000 mL     Discontinued Leonarda Leis G          LABORATORY RESULTS    Ordered and independently interpreted AVAILABLE laboratory tests. Please see results section in chart for full details.  Results for orders placed or performed during the hospital encounter of 03/30/15   Comprehensive metabolic panel   Result Value Ref Range    Glucose 512 (HH) 70 - 100 mg/dL    BUN 12 7 - 19 mg/dL    Creatinine 0.9 0.6 - 1.0 mg/dL    Sodium 161 (L) 096 - 145 mEq/L    Potassium 4.3 3.5 - 5.1 mEq/L    Chloride 97 (L) 100 - 111 mEq/L    CO2 22 22 - 29 mEq/L    Calcium 9.6 8.5 - 10.5 mg/dL    Protein, Total 7.3 6.0 - 8.3 g/dL    Albumin 3.6 3.5 - 5.0 g/dL    AST (SGOT) 14 5 - 34 U/L    ALT 18 0 - 55 U/L    Alkaline Phosphatase 110 (H) 37 - 106 U/L    Bilirubin, Total 0.3 0.2 - 1.2 mg/dL    Globulin 3.7 (H) 2.0 - 3.6 g/dL    Albumin/Globulin Ratio 1.0 0.9 - 2.2    Anion Gap 12.0 5.0 - 15.0   CBC with differential   Result Value Ref Range    WBC 7.81 3.50 - 10.80 x10 3/uL    Hgb 12.5 12.0 - 16.0 g/dL    Hematocrit 04.5 (L) 37.0 - 47.0 %    Platelets 352  140 - 400 x10 3/uL    RBC 4.65 4.20 - 5.40 x10 6/uL    MCV 77.0 (L) 80.0 - 100.0 fL    MCH 26.9 (L) 28.0 - 32.0 pg    MCHC 34.9 32.0 - 36.0 g/dL    RDW 14 12 - 15 %    MPV 9.9 9.4 - 12.3 fL    Neutrophils 62 None %    Lymphocytes Automated 30 None %    Monocytes 5 None %    Eosinophils Automated 2 None %    Basophils Automated 0 None %    Immature Granulocyte 0 None %    Nucleated RBC 0 0 - 1 /100 WBC    Neutrophils Absolute 4.86 1.80 - 8.10 x10 3/uL    Abs Lymph Automated 2.37 0.50 - 4.40 x10 3/uL    Abs Mono Automated 0.37 0.00 - 1.20 x10 3/uL    Abs Eos Automated 0.18 0.00 - 0.70 x10 3/uL    Absolute Baso Automated 0.03 0.00 - 0.20 x10 3/uL    Absolute Immature Granulocyte 0.01 0 x10 3/uL   Troponin I   Result Value Ref Range    Troponin I <0.01 0.00 - 0.09 ng/mL   GFR   Result Value Ref Range    EGFR >60.0    UA with reflex to micro (pts  3 + yrs)   Result Value Ref Range    Urine Type Clean Catch     Color, UA Straw Clear - Yellow    Clarity, UA Clear Clear - Hazy    Specific Gravity UA 1.026 1.001-1.035    Urine pH 6.0 5.0-8.0    Leukocyte Esterase, UA Negative Negative    Nitrite, UA Negative Negative    Protein, UR Negative Negative    Glucose, UA >=500 (A) Negative  Ketones UA Negative Negative    Urobilinogen, UA Negative 0.2  -  2.0 mg/dL    Bilirubin, UA Negative Negative    Blood, UA Negative Negative    RBC, UA 0 - 5 0 - 5 /hpf    WBC, UA 0 - 5 0 - 5 /hpf    Squamous Epithelial Cells, Urine 0 - 5 0 - 25 /hpf   Urine HCG, Qualitative   Result Value Ref Range    Urine HCG Qualitative Negative Negative   Glucose Whole Blood - POCT   Result Value Ref Range    POCT - Glucose Whole blood 451 (H) 70 - 100 mg/dL       CRITICAL CARE/PROCEDURES        DIAGNOSIS      Diagnosis:  Final diagnoses:   Right shoulder strain, initial encounter   Uncontrolled type 2 diabetes mellitus without complication, with long-term current use of insulin   Noncompliance with medication regimen   Rash       Disposition:  ED  Disposition     Discharge Allegra Grana discharge to home/self care.    Condition at disposition: Stable            Prescriptions:  Discharge Medication List as of 03/30/2015  3:12 PM      START taking these medications    Details   !! metFORMIN (GLUCOPHAGE) 500 MG tablet Take 1 tablet (500 mg total) by mouth 2 (two) times daily with meals., Starting 03/30/2015, Until Discontinued, Print       !! - Potential duplicate medications found. Please discuss with provider.      CONTINUE these medications which have NOT CHANGED    Details   ATORVASTATIN CALCIUM PO Take by mouth., Until Discontinued, Historical Med      GABAPENTIN PO Take by mouth., Until Discontinued, Historical Med      !! hydrocortisone 2.5 % cream Apply topically 2 (two) times daily., Starting 03/15/2015, Until Discontinued, Print      ibuprofen (ADVIL,MOTRIN) 600 MG tablet Take 1 tablet (600 mg total) by mouth every 6 (six) hours as needed for Pain or Fever., Starting 03/15/2015, Until Discontinued, Print      Insulin Glargine (LANTUS SC) Inject 45 Units into the skin.   , Until Discontinued, Historical Med      Liraglutide (VICTOZA SC) Inject into the skin., Until Discontinued, Historical Med      lisinopril (PRINIVIL,ZESTRIL) 20 MG tablet Take 20 mg by mouth daily., Until Discontinued, Historical Med      !! metFORMIN (GLUCOPHAGE) 500 MG tablet Take 2 tablets (1,000 mg total) by mouth 2 (two) times daily with meals., Starting 07/02/2013, Until Discontinued, Print      VITAMIN D, ERGOCALCIFEROL, PO Take by mouth., Until Discontinued, Historical Med      clotrimazole-betamethasone (LOTRISONE) cream Apply topically 2 (two) times daily., Starting 08/06/2013, Until Discontinued, Print      diflunisal (DOLOBID) 500 MG Tab tablet Take 1 tablet (500 mg total) by mouth 2 (two) times daily as needed (When necessary pain)., Starting 11/07/2014, Until Discontinued, Print      !! hydrocortisone 2.5 % cream Apply topically 2 (two) times daily., Starting 03/15/2015,  Until Discontinued, Print      HYDROmorphone (DILAUDID) 2 MG tablet Take 1 tablet (2 mg total) by mouth every 6 (six) hours as needed for Pain (As needed for pain)., Starting 08/11/2014, Until Discontinued, Print      metoclopramide (REGLAN) 10 MG tablet Take 1 tablet (  10 mg total) by mouth 3 (three) times daily as needed (nausea, vomiting, headache)., Starting 03/15/2015, Until Discontinued, Print      nystatin (MYCOSTATIN) cream Apply topically 2 (two) times daily., Starting 08/06/2013, Until Discontinued, Print      traMADol (ULTRAM) 50 MG tablet Take 1 tablet (50 mg total) by mouth every 6 (six) hours as needed for Pain. Do not drive or operate machinery while taking this medication, Starting 11/06/2014, Until Discontinued, Print       !! - Potential duplicate medications found. Please discuss with provider.              Loetta Rough, DO  04/02/15 2031

## 2015-04-06 ENCOUNTER — Emergency Department: Payer: Medicare Other

## 2015-04-06 ENCOUNTER — Emergency Department
Admission: EM | Admit: 2015-04-06 | Discharge: 2015-04-06 | Disposition: A | Discharge status: Home / Self Care | Payer: Medicare Other | Attending: Emergency Medicine | Admitting: Emergency Medicine

## 2015-04-06 DIAGNOSIS — Z7984 Long term (current) use of oral hypoglycemic drugs: Secondary | ICD-10-CM | POA: Insufficient documentation

## 2015-04-06 DIAGNOSIS — R739 Hyperglycemia, unspecified: Secondary | ICD-10-CM

## 2015-04-06 DIAGNOSIS — Z794 Long term (current) use of insulin: Secondary | ICD-10-CM | POA: Insufficient documentation

## 2015-04-06 DIAGNOSIS — B3731 Acute candidiasis of vulva and vagina: Secondary | ICD-10-CM

## 2015-04-06 DIAGNOSIS — B373 Candidiasis of vulva and vagina: Secondary | ICD-10-CM | POA: Insufficient documentation

## 2015-04-06 DIAGNOSIS — E1165 Type 2 diabetes mellitus with hyperglycemia: Secondary | ICD-10-CM | POA: Insufficient documentation

## 2015-04-06 DIAGNOSIS — E7800 Pure hypercholesterolemia, unspecified: Secondary | ICD-10-CM | POA: Insufficient documentation

## 2015-04-06 DIAGNOSIS — Z79899 Other long term (current) drug therapy: Secondary | ICD-10-CM | POA: Insufficient documentation

## 2015-04-06 DIAGNOSIS — J45909 Unspecified asthma, uncomplicated: Secondary | ICD-10-CM | POA: Insufficient documentation

## 2015-04-06 LAB — URINALYSIS, REFLEX TO MICROSCOPIC EXAM IF INDICATED
Bilirubin, UA: NEGATIVE
Glucose, UA: >=500 — AB
Ketones UA: 20 — AB
Nitrite, UA: NEGATIVE
Protein, UR: NEGATIVE
Specific Gravity UA: >1.035 — AB (ref 1.001–1.035)
Urine pH: 6 (ref 5.0–8.0)
Urobilinogen, UA: NEGATIVE mg/dL

## 2015-04-06 LAB — RAPID DRUG SCREEN, URINE
Barbiturate Screen, UR: NEGATIVE
Benzodiazepine Screen, UR: NEGATIVE
Cannabinoid Screen, UR: NEGATIVE
Cocaine, UR: NEGATIVE
Opiate Screen, UR: NEGATIVE
PCP Screen, UR: NEGATIVE
Urine Amphetamine Screen: NEGATIVE

## 2015-04-06 LAB — URINE HCG QUALITATIVE: Urine HCG Qualitative: NEGATIVE

## 2015-04-06 MED ORDER — FLUCONAZOLE 150 MG PO TABS
150.0000 mg | ORAL_TABLET | ORAL | Status: DC
Start: 2015-04-06 — End: 2015-04-17

## 2015-04-06 MED ORDER — IBUPROFEN 600 MG PO TABS
600.0000 mg | ORAL_TABLET | Freq: Once | ORAL | Status: DC
Start: 2015-04-06 — End: 2015-04-06

## 2015-04-06 MED ORDER — FLUCONAZOLE 100 MG PO TABS
200.0000 mg | ORAL_TABLET | Freq: Once | ORAL | Status: DC
Start: 2015-04-06 — End: 2015-04-06
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
## 2015-04-06 NOTE — ED Notes (Addendum)
Pt refuses medication at [place]; pt refuses vital signs at [place]; pt is agitated and yelling at staff; Dr aware and discussed medical plan with pt and follow up; security at bedside

## 2015-04-06 NOTE — Discharge Instructions (Signed)
1. Return immediately if worse in any way.    2. Follow up with gynecology and your primary medical doctor for recheck.

## 2015-04-06 NOTE — ED Notes (Signed)
Pelvic exam done with dr Nonda Lou- no cx obtained at this time.

## 2015-04-08 NOTE — ED Provider Notes (Signed)
EMERGENCY DEPARTMENT NOTE    Physician/Midlevel provider first contact with patient: 04/06/15 1722        DISCUSSION    Patient verbally aggressive during her entire stay.  Patient did consent to external genital exam which was completed with RN present.  Exam consistent with vulvovaginal candidiasis.  I recommended diflucan.  Patient very agitated and states that she gets this all the time and does not want diflucan she wants pain medication.  I explained to patient the treatment is diflucan and the reason she continues to have this issue is her poorly controlled blood sugar and we need to work to get her appropriate follow up for that.  Patient refuses repeat blood glucose and pelvic cultures in ED.  Patient very agitated in the ED.  Patient demanding narcotic pain medication.  I informed patient I am not willing to provide this for her chronic issues at which point she became even more agitated.  I explained to patient I would recommend diflucan for her pelvic complaints and f/u with PCP for her poorly controlled blood glucose and chronic back pain.  Patient then started swearing and stated "I am just going to go somewhere else and you are not getting paid".  I explained to patient the payment does not concern me only the appropriate treatment.  I again explained my recommendation and referrals to GYN and PCP provided.  No EMTALA defined EM condition.     HISTORY OF PRESENT ILLNESS   Historian:Patient  Translator Used: No    Chief Complaint: Back Pain     39 y.o. female presents to the ED via ambulance with complaint of 1 year of low back pain.  Pain worse with movement.  No numbness, weakness, tingling.  No bowel/bladder incontinence or retention.  No fever/chills.  No weight loss.  No trauma.  Patient is ambulatory.  No dysuria/hematuria.  Patient also notes 1 year of vaginal irritation which is somewhat constant and improved with treatment (patient unclear what treatment has been provided) but then returns.   Some white vaginal discharge. Patient has not yet followed up with PCP since her last few visits for hyperglycemia.    1. Location of symptoms: pelvis  2. Onset of symptoms: 1 year  3. What was patient doing when symptoms started (Context): see above  4. Severity: moderate  5. Timing: constant  6. Activities that worsen symptoms: none  7. Activities that improve symptoms: none  8. Quality: itchy  9. Radiation of symptoms: no  10. Associated signs and Symptoms: see above  11. Are symptoms worsening? yes  MEDICAL HISTORY     Past Medical History:  Past Medical History   Diagnosis Date   . Diabetes mellitus without complication    . Asthma without status asthmaticus    . Anemia    . Depression    . Anemia    . Panic attack    . Seasonal allergies    . High cholesterol        Past Surgical History:  Past Surgical History   Procedure Laterality Date   . Cesarean section     . Cholecystectomy     . Tonsillectomy     . Hernia repair         Social History:  Social History     Social History   . Marital Status: Single     Spouse Name: N/A   . Number of Children: N/A   . Years of Education: N/A  Occupational History   . Not on file.     Social History Main Topics   . Smoking status: Never Smoker    . Smokeless tobacco: Not on file   . Alcohol Use: No   . Drug Use: No   . Sexual Activity: Not on file     Other Topics Concern   . Not on file     Social History Narrative       Family History:  Family History   Problem Relation Age of Onset   . Diabetes Mother    . Diabetes Sister    . Diabetes Maternal Aunt    . Hypertension Paternal Grandmother    . No known problems Daughter    . No known problems Son    . No known problems Daughter    . No known problems Son        Outpatient Medication:  Discharge Medication List as of 04/06/2015  7:00 PM      CONTINUE these medications which have NOT CHANGED    Details   ATORVASTATIN CALCIUM PO Take by mouth., Until Discontinued, Historical Med      GABAPENTIN PO Take by mouth., Until  Discontinued, Historical Med      hydrocortisone 2.5 % cream Apply topically 2 (two) times daily., Starting 03/15/2015, Until Discontinued, Print      ibuprofen (ADVIL,MOTRIN) 600 MG tablet Take 1 tablet (600 mg total) by mouth every 6 (six) hours as needed for Pain or Fever., Starting 03/15/2015, Until Discontinued, Print      Insulin Glargine (LANTUS SC) Inject 45 Units into the skin.   , Until Discontinued, Historical Med      Liraglutide (VICTOZA SC) Inject into the skin., Until Discontinued, Historical Med      lisinopril (PRINIVIL,ZESTRIL) 20 MG tablet Take 20 mg by mouth daily., Until Discontinued, Historical Med      !! metFORMIN (GLUCOPHAGE) 500 MG tablet Take 2 tablets (1,000 mg total) by mouth 2 (two) times daily with meals., Starting 07/02/2013, Until Discontinued, Print      !! metFORMIN (GLUCOPHAGE) 500 MG tablet Take 1 tablet (500 mg total) by mouth 2 (two) times daily with meals., Starting 03/30/2015, Until Discontinued, Print      VITAMIN D, ERGOCALCIFEROL, PO Take by mouth., Until Discontinued, Historical Med       !! - Potential duplicate medications found. Please discuss with provider.            REVIEW OF SYSTEMS   Review of Systems   Constitutional: Negative for fever and chills.   HENT: Negative for congestion.    Eyes: Negative for blurred vision and double vision.   Respiratory: Negative for cough and shortness of breath.    Cardiovascular: Negative for chest pain and leg swelling.   Gastrointestinal: Negative for nausea, vomiting, abdominal pain and diarrhea.   Genitourinary: Negative for dysuria and hematuria.   Musculoskeletal: Positive for back pain. Negative for neck pain.   Skin: Negative for rash.   Neurological: Negative for focal weakness, loss of consciousness and headaches.   All other systems reviewed and are negative.       PHYSICAL EXAM   ED Triage Vitals   Enc Vitals Group      BP 04/06/15 1714 165/75 mmHg      Heart Rate 04/06/15 1714 99      Resp Rate 04/06/15 1714 16       Temp 04/06/15 1714 98.4 F (36.9 C)  Temp src --       SpO2 04/06/15 1714 98 %      Weight 04/06/15 1714 145.605 kg      Height 04/06/15 1714 1.803 m      Head Cir --       Peak Flow --       Pain Score 04/06/15 1714 6      Pain Loc --       Pain Edu? --       Excl. in GC? --    Nursing note and vitals reviewed.  Constitutional:  Well developed, well nourished.  Awake & alert.    Head:  Atraumatic.  Normocephalic.    ENT:  Mucous membranes are moist and intact.  Oropharynx is clear and symmetric.  Patent airway.  Neck:  Supple.  No JVD.  No lymphadenopathy.  Cardiovascular:  Regular rate.  Regular rhythm.   Pulmonary/Chest:  No evidence of respiratory distress.  Clear to auscultation bilaterally.  No wheezing, rales or rhonchi. Chest non-tender.  Abdominal:  Soft and non-distended.  There is no tenderness.  No rebound, guarding, or rigidity.  No bruit. No masses palpable  GU: Inflammation of the vulva with small white discharge.  Patient refuses internal exam.   Back:  No CVA tenderness. FROM.   Extremities:  No edema.   No cyanosis.  No clubbing.  Full range of motion in all extremities.  No calf tenderness. 2+ DP/PT/radial pulses b/l.  Skin:  Skin is warm and dry.  No diaphoresis.    Neurological:  Alert, awake, and appropriate.  Normal speech.  Motor intact b/l UE and LE.  Psychiatric:  Good eye contact.  Normal interaction, affect, and behavior    MEDICAL DECISION MAKING     DISCUSSION    Patient verbally aggressive during her entire stay.  Patient did consent to external genital exam which was completed with RN present.  Exam consistent with vulvovaginal candidiasis.  I recommended diflucan.  Patient very agitated and states that she gets this all the time and does not want diflucan she wants pain medication.  I explained to patient the treatment is diflucan and the reason she continues to have this issue is her poorly controlled blood sugar and we need to work to get her appropriate follow up for that.   Patient refuses repeat blood glucose and pelvic cultures in ED.  Patient very agitated in the ED.  Patient demanding narcotic pain medication.  I informed patient I am not willing to provide this for her chronic issues at which point she became even more agitated.  I explained to patient I would recommend diflucan for her pelvic complaints and f/u with PCP for her poorly controlled blood glucose and chronic back pain.  Patient then started swearing and stated "I am just going to go somewhere else and you are not getting paid".  I explained to patient the payment does not concern me only the appropriate treatment.  I again explained my recommendation and referrals to GYN and PCP provided.  No EMTALA defined EM condition.    Will discharge home follow PCP/GYN return if worse.     Follow-up Information     Follow up with Duaine Dredge, MD In 3 days.    Specialty:  Obstetrics and Gynecology    Contact information:    [address]  [place] [address]  [phone]          Follow up with [facility] In 3 days.    Contact information:    [address]  [address]  [place] [address]  [phone]            Vital Signs: Reviewed the patient?s vital signs.   Nursing Notes: Reviewed and utilized available nursing notes.  Medical Records Reviewed: Reviewed available past medical records.  Counseling: The emergency provider has spoken with the patient and discussed today?s findings, in addition to providing specific details for the plan of care.  Questions are answered and there is agreement with the plan.      IMAGING STUDIES      No orders to display         PULSE OXIMETRY    Oxygen Saturation by Pulse Oximetry: 98%  Interventions: none  Interpretation:  normal    EMERGENCY DEPT. MEDICATIONS      ED Medication Orders     Start Ordered     Status Ordering Provider    04/06/15 1901 04/06/15 1900     Once,   Status:  Discontinued     Route: Oral  Ordered  Dose: 600 mg     Discontinued Bethann Berkshire D    04/06/15 1900 04/06/15 1859     Once,   Status:  Discontinued     Route: Oral  Ordered Dose: 200 mg     Discontinued Khing Belcher D          LABORATORY RESULTS    Ordered and independently interpreted AVAILABLE laboratory tests. Please see results section in chart for full details.  Results for orders placed or performed during the hospital encounter of 04/06/15   UA with reflex to micro (pts  3 + yrs)   Result Value Ref Range    Urine Type Clean Catch     Color, UA Straw Clear - Yellow    Clarity, UA Clear Clear - Hazy    Specific Gravity UA >1.035 (A) 1.001-1.035    Urine pH 6.0 5.0-8.0    Leukocyte Esterase, UA Moderate (A) Negative    Nitrite, UA Negative Negative    Protein, UR Negative Negative    Glucose, UA >=500 (A) Negative    Ketones UA 20 (A) Negative    Urobilinogen, UA Negative 0.2  -  2.0 mg/dL    Bilirubin, UA Negative Negative    Blood, UA Small (A) Negative    RBC, UA 0 - 5 0 - 5 /hpf    WBC, UA 6 - 10 (A) 0 - 5 /hpf    Squamous Epithelial Cells, Urine 0 - 5 0 - 25 /hpf   Urine HCG, Qualitative   Result Value Ref Range    Urine HCG Qualitative Negative Negative   Urine Tox Screen (Rapid Drug Screen)   Result Value Ref Range    Amphetamine Screen, UR Negative Negative    Barbiturate Screen, UR Negative Negative    Benzodiazepine Screen, UR Negative Negative    Cannabinoid Screen, UR Negative Negative    Cocaine, UR Negative Negative    Opiate Screen, UR Negative Negative    PCP Screen, UR Negative Negative       DIAGNOSIS      Diagnosis:  Final diagnoses:   Vulvovaginal candidiasis   Hyperglycemia       Disposition:  ED Disposition     Discharge Dorothy [name] discharge to home/self care.    Condition at disposition: Stable            Prescriptions:  Discharge Medication List as of 04/06/2015  7:00 PM      START taking these medications    Details   fluconazole (DIFLUCAN) 150 MG tablet Take 1 tablet (150 mg total) by mouth every 3 (three)  days., Starting 04/06/2015, Until Mon 04/13/15, Print         CONTINUE these medications which have NOT CHANGED    Details   ATORVASTATIN CALCIUM PO Take by mouth., Until Discontinued, Historical Med      GABAPENTIN PO Take by mouth., Until Discontinued, Historical Med      hydrocortisone 2.5 % cream Apply topically 2 (two) times daily., Starting 03/15/2015, Until Discontinued, Print      ibuprofen (ADVIL,MOTRIN) 600 MG tablet Take 1 tablet (600 mg total) by mouth every 6 (six) hours as needed for Pain or Fever., Starting 03/15/2015, Until Discontinued, Print      Insulin Glargine (LANTUS SC) Inject 45 Units into the skin.   , Until Discontinued, Historical Med      Liraglutide (VICTOZA SC) Inject into the skin., Until Discontinued, Historical Med      lisinopril (PRINIVIL,ZESTRIL) 20 MG tablet Take 20 mg by mouth daily., Until Discontinued, Historical Med      !! metFORMIN (GLUCOPHAGE) 500 MG tablet Take 2 tablets (1,000 mg total) by mouth 2 (two) times daily with meals., Starting 07/02/2013, Until Discontinued, Print      !! metFORMIN (GLUCOPHAGE) 500 MG tablet Take 1 tablet (500 mg total) by mouth 2 (two) times daily with meals., Starting 03/30/2015, Until Discontinued, Print      VITAMIN D, ERGOCALCIFEROL, PO Take by mouth., Until Discontinued, Historical Med       !! - Potential duplicate medications found. Please discuss with provider.            Shela Nevin, MD  04/08/15 [phone]

## 2015-04-13 ENCOUNTER — Inpatient Hospital Stay
Admission: EM | Admit: 2015-04-13 | Discharge: 2015-04-17 | DRG: 759 | Disposition: A | Discharge status: Home / Self Care | Payer: Medicare Other | Attending: Pediatrics | Admitting: Pediatrics

## 2015-04-13 ENCOUNTER — Inpatient Hospital Stay: Payer: Medicare Other

## 2015-04-13 ENCOUNTER — Inpatient Hospital Stay: Payer: Medicare Other | Admitting: Internal Medicine

## 2015-04-13 DIAGNOSIS — F410 Panic disorder [episodic paroxysmal anxiety] without agoraphobia: Secondary | ICD-10-CM | POA: Diagnosis present

## 2015-04-13 DIAGNOSIS — N762 Acute vulvitis: Secondary | ICD-10-CM | POA: Diagnosis present

## 2015-04-13 DIAGNOSIS — J45909 Unspecified asthma, uncomplicated: Secondary | ICD-10-CM | POA: Diagnosis present

## 2015-04-13 DIAGNOSIS — Z6830 Body mass index (BMI) 30.0-30.9, adult: Secondary | ICD-10-CM

## 2015-04-13 DIAGNOSIS — Z91199 Patient's noncompliance with other medical treatment and regimen due to unspecified reason: Secondary | ICD-10-CM

## 2015-04-13 DIAGNOSIS — E785 Hyperlipidemia, unspecified: Secondary | ICD-10-CM

## 2015-04-13 DIAGNOSIS — E1165 Type 2 diabetes mellitus with hyperglycemia: Secondary | ICD-10-CM | POA: Diagnosis present

## 2015-04-13 DIAGNOSIS — I10 Essential (primary) hypertension: Secondary | ICD-10-CM | POA: Diagnosis present

## 2015-04-13 DIAGNOSIS — B373 Candidiasis of vulva and vagina: Principal | ICD-10-CM | POA: Diagnosis present

## 2015-04-13 DIAGNOSIS — E7800 Pure hypercholesterolemia, unspecified: Secondary | ICD-10-CM | POA: Diagnosis present

## 2015-04-13 DIAGNOSIS — Z885 Allergy status to narcotic agent status: Secondary | ICD-10-CM

## 2015-04-13 DIAGNOSIS — B3731 Acute candidiasis of vulva and vagina: Secondary | ICD-10-CM | POA: Diagnosis present

## 2015-04-13 DIAGNOSIS — Z9114 Patient's other noncompliance with medication regimen: Secondary | ICD-10-CM

## 2015-04-13 DIAGNOSIS — B372 Candidiasis of skin and nail: Secondary | ICD-10-CM | POA: Diagnosis present

## 2015-04-13 DIAGNOSIS — Z794 Long term (current) use of insulin: Secondary | ICD-10-CM

## 2015-04-13 DIAGNOSIS — IMO0002 Reserved for concepts with insufficient information to code with codable children: Secondary | ICD-10-CM

## 2015-04-13 DIAGNOSIS — E1100 Type 2 diabetes mellitus with hyperosmolarity without nonketotic hyperglycemic-hyperosmolar coma (NKHHC): Secondary | ICD-10-CM

## 2015-04-13 DIAGNOSIS — Z9119 Patient's noncompliance with other medical treatment and regimen: Secondary | ICD-10-CM

## 2015-04-13 LAB — COMPREHENSIVE METABOLIC PANEL
ALT: 13 U/L (ref 0–55)
AST (SGOT): 12 U/L (ref 5–34)
Albumin/Globulin Ratio: 0.9 (ref 0.9–2.2)
Albumin: 3.4 g/dL — ABNORMAL LOW (ref 3.5–5.0)
Alkaline Phosphatase: 93 U/L (ref 37–106)
Anion Gap: 10 (ref 5.0–15.0)
BUN: 12 mg/dL (ref 7.0–19.0)
Bilirubin, Total: 0.4 mg/dL (ref 0.2–1.2)
CO2: 22 mEq/L (ref 22–29)
Calcium: 9.5 mg/dL (ref 8.5–10.5)
Chloride: 100 mEq/L (ref 100–111)
Creatinine: 0.8 mg/dL (ref 0.6–1.0)
Globulin: 3.6 g/dL (ref 2.0–3.6)
Glucose: 380 mg/dL — ABNORMAL HIGH (ref 70–100)
Potassium: 3.8 mEq/L (ref 3.5–5.1)
Protein, Total: 7 g/dL (ref 6.0–8.3)
Sodium: 132 mEq/L — ABNORMAL LOW (ref 136–145)

## 2015-04-13 LAB — CBC AND DIFFERENTIAL
Basophils Absolute Automated: 0.03 10*3/uL (ref 0.00–0.20)
Basophils Absolute Automated: 0.03 10*3/uL (ref 0.00–0.20)
Basophils Automated: 0 %
Basophils Automated: 0 %
Eosinophils Absolute Automated: 0.19 10*3/uL (ref 0.00–0.70)
Eosinophils Absolute Automated: 0.16 10*3/uL (ref 0.00–0.70)
Eosinophils Automated: 2 %
Eosinophils Automated: 3 %
Hematocrit: 35.6 % — ABNORMAL LOW (ref 37.0–47.0)
Hematocrit: 33.4 % — ABNORMAL LOW (ref 37.0–47.0)
Hgb: 12.2 g/dL (ref 12.0–16.0)
Hgb: 11.3 g/dL — ABNORMAL LOW (ref 12.0–16.0)
Immature Granulocytes Absolute: 0.01 10*3/uL
Immature Granulocytes Absolute: 0 10*3/uL
Immature Granulocytes: 0 %
Immature Granulocytes: 0 %
Lymphocytes Absolute Automated: 2.75 10*3/uL (ref 0.50–4.40)
Lymphocytes Absolute Automated: 1.77 10*3/uL (ref 0.50–4.40)
Lymphocytes Automated: 34 %
Lymphocytes Automated: 31 %
MCH: 26.2 pg — ABNORMAL LOW (ref 28.0–32.0)
MCH: 26 pg — ABNORMAL LOW (ref 28.0–32.0)
MCHC: 34.3 g/dL (ref 32.0–36.0)
MCHC: 33.8 g/dL (ref 32.0–36.0)
MCV: 76.6 fL — ABNORMAL LOW (ref 80.0–100.0)
MCV: 76.8 fL — ABNORMAL LOW (ref 80.0–100.0)
MPV: 9.4 fL (ref 9.4–12.3)
MPV: 9.4 fL (ref 9.4–12.3)
Monocytes Absolute Automated: 0.47 10*3/uL (ref 0.00–1.20)
Monocytes Absolute Automated: 0.3 10*3/uL (ref 0.00–1.20)
Monocytes: 6 %
Monocytes: 5 %
Neutrophils Absolute: 4.61 10*3/uL (ref 1.80–8.10)
Neutrophils Absolute: 3.45 10*3/uL (ref 1.80–8.10)
Neutrophils: 57 %
Neutrophils: 60 %
Nucleated RBC: 0 /100 WBC (ref 0–1)
Nucleated RBC: 0 /100 WBC (ref 0–1)
Platelets: 343 10*3/uL (ref 140–400)
Platelets: 278 10*3/uL (ref 140–400)
RBC: 4.65 10*6/uL (ref 4.20–5.40)
RBC: 4.35 10*6/uL (ref 4.20–5.40)
RDW: 15 % (ref 12–15)
RDW: 14 % (ref 12–15)
WBC: 8.05 10*3/uL (ref 3.50–10.80)
WBC: 5.71 10*3/uL (ref 3.50–10.80)

## 2015-04-13 LAB — BASIC METABOLIC PANEL
Anion Gap: 10 (ref 5.0–15.0)
BUN: 13 mg/dL (ref 7.0–19.0)
CO2: 22 mEq/L (ref 22–29)
Calcium: 8.7 mg/dL (ref 8.5–10.5)
Chloride: 102 mEq/L (ref 100–111)
Creatinine: 0.8 mg/dL (ref 0.6–1.0)
Glucose: 322 mg/dL — ABNORMAL HIGH (ref 70–100)
Potassium: 3.7 mEq/L (ref 3.5–5.1)
Sodium: 134 mEq/L — ABNORMAL LOW (ref 136–145)

## 2015-04-13 LAB — URINALYSIS WITH MICROSCOPIC
Bilirubin, UA: NEGATIVE
Glucose, UA: 500 — AB
Ketones UA: NEGATIVE
Nitrite, UA: NEGATIVE
Protein, UR: 30 — AB
Specific Gravity UA: >1.035 — AB (ref 1.001–1.035)
Urine pH: 5 (ref 5.0–8.0)
Urobilinogen, UA: NEGATIVE mg/dL

## 2015-04-13 LAB — GLUCOSE WHOLE BLOOD - POCT
Whole Blood Glucose POCT: 345 mg/dL — ABNORMAL HIGH (ref 70–100)
Whole Blood Glucose POCT: 315 mg/dL — ABNORMAL HIGH (ref 70–100)
Whole Blood Glucose POCT: 324 mg/dL — ABNORMAL HIGH (ref 70–100)
Whole Blood Glucose POCT: 272 mg/dL — ABNORMAL HIGH (ref 70–100)
Whole Blood Glucose POCT: 340 mg/dL — ABNORMAL HIGH (ref 70–100)

## 2015-04-13 LAB — URINALYSIS, REFLEX TO MICROSCOPIC EXAM IF INDICATED
Bilirubin, UA: NEGATIVE
Glucose, UA: 500 — AB
Ketones UA: NEGATIVE
Nitrite, UA: NEGATIVE
Protein, UR: 30 — AB
Specific Gravity UA: >1.035 — AB (ref 1.001–1.035)
Urine pH: 6 (ref 5.0–8.0)
Urobilinogen, UA: NEGATIVE mg/dL

## 2015-04-13 LAB — HEMOLYSIS INDEX
Hemolysis Index: 6 (ref 0–18)
Hemolysis Index: 6 (ref 0–18)

## 2015-04-13 LAB — GFR
EGFR: >60
EGFR: >60

## 2015-04-13 LAB — HEMOGLOBIN A1C
Average Estimated Glucose: 294.8 mg/dL
Hemoglobin A1C: 11.9 % — ABNORMAL HIGH (ref 4.6–5.9)

## 2015-04-13 LAB — HCG QUANTITATIVE: hCG, Quant.: <1.2

## 2015-04-13 MED ORDER — INSULIN ASPART 100 UNIT/ML SC SOLN
1.0000 [IU] | SUBCUTANEOUS | Status: DC | PRN
Start: 2015-04-13 — End: 2015-04-17
  Administered 2015-04-13 (×3): 4 [IU] via SUBCUTANEOUS
  Administered 2015-04-13: 3 [IU] via SUBCUTANEOUS
  Administered 2015-04-14: 4 [IU] via SUBCUTANEOUS
  Administered 2015-04-14 (×2): 3 [IU] via SUBCUTANEOUS
  Administered 2015-04-14: 5 [IU] via SUBCUTANEOUS
  Administered 2015-04-14: 3 [IU] via SUBCUTANEOUS
  Administered 2015-04-15: 4 [IU] via SUBCUTANEOUS
  Administered 2015-04-15 (×2): 3 [IU] via SUBCUTANEOUS
  Administered 2015-04-15: 2 [IU] via SUBCUTANEOUS
  Administered 2015-04-15: 3 [IU] via SUBCUTANEOUS
  Administered 2015-04-16: 4 [IU] via SUBCUTANEOUS
  Administered 2015-04-16: 3 [IU] via SUBCUTANEOUS
  Administered 2015-04-16: 2 [IU] via SUBCUTANEOUS
  Administered 2015-04-17: 4 [IU] via SUBCUTANEOUS
  Administered 2015-04-17 (×2): 3 [IU] via SUBCUTANEOUS
  Filled 2015-04-13: qty 2
  Filled 2015-04-13: qty 3
  Filled 2015-04-13: qty 2
  Filled 2015-04-13 (×2): qty 4
  Filled 2015-04-13 (×2): qty 3
  Filled 2015-04-13: qty 4
  Filled 2015-04-13: qty 3
  Filled 2015-04-13 (×2): qty 4
  Filled 2015-04-13 (×3): qty 3
  Filled 2015-04-13 (×2): qty 4
  Filled 2015-04-13: qty 2
  Filled 2015-04-13: qty 5
  Filled 2015-04-13: qty 3

## 2015-04-13 MED ORDER — MORPHINE SULFATE 4 MG/ML IJ/IV SOLN (WRAP)
4.0000 mg | Freq: Once | Status: AC
Start: 2015-04-13 — End: 2015-04-13
  Administered 2015-04-13: 4 mg via INTRAVENOUS
  Filled 2015-04-13: qty 1

## 2015-04-13 MED ORDER — NYSTOP 100000 UNIT/GM EX POWD
1.0000 | Freq: Two times a day (BID) | CUTANEOUS | Status: DC
Start: 2015-04-13 — End: 2015-04-17
  Administered 2015-04-13 – 2015-04-17 (×9): 1 via TOPICAL
  Filled 2015-04-13 (×2): qty 15

## 2015-04-13 MED ORDER — SODIUM CHLORIDE 0.9 % IV BOLUS
1000.0000 mL | Freq: Once | INTRAVENOUS | Status: AC
Start: 2015-04-13 — End: 2015-04-13
  Administered 2015-04-13: 1000 mL via INTRAVENOUS

## 2015-04-13 MED ORDER — DIPHENHYDRAMINE HCL 25 MG PO CAPS
25.0000 mg | ORAL_CAPSULE | Freq: Once | ORAL | Status: AC
Start: 2015-04-13 — End: 2015-04-14
  Administered 2015-04-14: 25 mg via ORAL
  Filled 2015-04-13: qty 1

## 2015-04-13 MED ORDER — GLUCAGON 1 MG IJ SOLR (WRAP)
1.0000 mg | INTRAMUSCULAR | Status: DC | PRN
Start: 2015-04-13 — End: 2015-04-17

## 2015-04-13 MED ORDER — ONDANSETRON HCL 4 MG/2ML IJ SOLN
4.0000 mg | Freq: Three times a day (TID) | INTRAMUSCULAR | Status: DC | PRN
Start: 2015-04-13 — End: 2015-04-13

## 2015-04-13 MED ORDER — VANCOMYCIN HCL 1000 MG IV SOLR
1750.0000 mg | Freq: Two times a day (BID) | INTRAVENOUS | Status: DC
Start: 2015-04-13 — End: 2015-04-15
  Administered 2015-04-13 – 2015-04-15 (×4): 1750 mg via INTRAVENOUS
  Filled 2015-04-13 (×5): qty 1750

## 2015-04-13 MED ORDER — KETOROLAC TROMETHAMINE 30 MG/ML IJ SOLN
30.0000 mg | Freq: Once | INTRAMUSCULAR | Status: AC
Start: 2015-04-13 — End: 2015-04-13
  Administered 2015-04-13: 30 mg via INTRAVENOUS
  Filled 2015-04-13: qty 1

## 2015-04-13 MED ORDER — ONDANSETRON HCL 4 MG/2ML IJ SOLN
4.0000 mg | Freq: Three times a day (TID) | INTRAMUSCULAR | Status: DC | PRN
Start: 2015-04-13 — End: 2015-04-17

## 2015-04-13 MED ORDER — SODIUM CHLORIDE 0.9 % IV MBP
100.0000 mg | INTRAVENOUS | Status: DC
Start: 2015-04-13 — End: 2015-04-17
  Administered 2015-04-13 – 2015-04-17 (×5): 100 mg via INTRAVENOUS
  Filled 2015-04-13 (×5): qty 10

## 2015-04-13 MED ORDER — SODIUM CHLORIDE 0.9 % IV SOLN
INTRAVENOUS | Status: DC
Start: 2015-04-13 — End: 2015-04-15

## 2015-04-13 MED ORDER — SODIUM CHLORIDE 0.9 % IV SOLN
100.0000 mL/h | INTRAVENOUS | Status: DC
Start: 2015-04-13 — End: 2015-04-13

## 2015-04-13 MED ORDER — RISAQUAD PO CAPS
1.0000 | ORAL_CAPSULE | Freq: Every day | ORAL | Status: DC
Start: 2015-04-13 — End: 2015-04-17
  Administered 2015-04-13 – 2015-04-17 (×5): 1 via ORAL
  Filled 2015-04-13 (×5): qty 1

## 2015-04-13 MED ORDER — ACETAMINOPHEN 500 MG PO TABS
1000.0000 mg | ORAL_TABLET | ORAL | Status: DC | PRN
Start: 2015-04-13 — End: 2015-04-17
  Administered 2015-04-14: 1000 mg via ORAL
  Filled 2015-04-13: qty 2

## 2015-04-13 MED ORDER — FLUCONAZOLE IN SODIUM CHLORIDE 100-0.9 MG/50ML-% IV SOLN
100.0000 mg | Freq: Every evening | INTRAVENOUS | Status: DC
Start: 2015-04-13 — End: 2015-04-13
  Administered 2015-04-13: 100 mg via INTRAVENOUS
  Filled 2015-04-13: qty 50

## 2015-04-13 MED ORDER — LISINOPRIL 20 MG PO TABS
20.0000 mg | ORAL_TABLET | Freq: Every day | ORAL | Status: DC
Start: 2015-04-13 — End: 2015-04-17
  Administered 2015-04-13 – 2015-04-16 (×4): 20 mg via ORAL
  Filled 2015-04-13 (×4): qty 1

## 2015-04-13 MED ORDER — ONDANSETRON 4 MG PO TBDP
4.0000 mg | ORAL_TABLET | Freq: Three times a day (TID) | ORAL | Status: DC | PRN
Start: 2015-04-13 — End: 2015-04-17

## 2015-04-13 MED ORDER — SODIUM CHLORIDE 0.9 % IV MBP
4.5000 g | Freq: Three times a day (TID) | INTRAVENOUS | Status: DC
Start: 2015-04-13 — End: 2015-04-17
  Administered 2015-04-13 – 2015-04-17 (×12): 4.5 g via INTRAVENOUS
  Filled 2015-04-13 (×2): qty 100
  Filled 2015-04-13 (×2): qty 20
  Filled 2015-04-13: qty 100
  Filled 2015-04-13: qty 20
  Filled 2015-04-13: qty 100
  Filled 2015-04-13 (×2): qty 20
  Filled 2015-04-13: qty 100
  Filled 2015-04-13: qty 20
  Filled 2015-04-13 (×6): qty 100
  Filled 2015-04-13 (×3): qty 20
  Filled 2015-04-13: qty 100
  Filled 2015-04-13 (×4): qty 20
  Filled 2015-04-13: qty 100

## 2015-04-13 MED ORDER — NALOXONE HCL 0.4 MG/ML IJ SOLN (WRAP)
0.2000 mg | INTRAMUSCULAR | Status: DC | PRN
Start: 2015-04-13 — End: 2015-04-17

## 2015-04-13 MED ORDER — MORPHINE SULFATE 2 MG/ML IJ/IV SOLN (WRAP)
2.0000 mg | Status: DC | PRN
Start: 2015-04-13 — End: 2015-04-13
  Administered 2015-04-13 (×2): 2 mg via INTRAVENOUS
  Filled 2015-04-13 (×2): qty 1

## 2015-04-13 MED ORDER — VANCOMYCIN HCL 1000 MG IV SOLR
2000.0000 mg | Freq: Once | INTRAVENOUS | Status: AC
Start: 2015-04-13 — End: 2015-04-13
  Administered 2015-04-13: 2000 mg via INTRAVENOUS
  Filled 2015-04-13: qty 2000

## 2015-04-13 MED ORDER — DEXTROSE 50 % IV SOLN
25.0000 mL | INTRAVENOUS | Status: DC | PRN
Start: 2015-04-13 — End: 2015-04-17

## 2015-04-13 MED ORDER — INFLUENZA VAC SPLIT QUAD 0.5 ML IM SUSY
0.5000 mL | PREFILLED_SYRINGE | Freq: Once | INTRAMUSCULAR | Status: DC | PRN
Start: 2015-04-13 — End: 2015-04-17

## 2015-04-13 MED ORDER — MORPHINE SULFATE 2 MG/ML IJ/IV SOLN (WRAP)
1.0000 mg | Status: DC | PRN
Start: 2015-04-13 — End: 2015-04-14
  Administered 2015-04-13: 1 mg via INTRAVENOUS
  Filled 2015-04-13: qty 1

## 2015-04-13 NOTE — Plan of Care (Signed)
Problem: Safety  Goal: Patient will be free from injury during hospitalization  Outcome: Progressing  Pt was moderate fall risk, assisted pt to the bathroom, fall precaution in place.   Plan: cont with hourly rounding. Assist pt as needed.    Problem: Pain  Goal: Patient's pain/discomfort is manageable  Outcome: Progressing  Pt reported perineum pain 10/10, morphine given with some relief. Pt also reported itchy, cleaned with bath cloth wipes.  Plan: cont with pain management.
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
## 2015-04-13 NOTE — Treatment Plan (Addendum)
Infectious Diseases & Tropical  Medicine  Full Consult Dictated        Full consult dictated: # [id number]    Assessment:    Vulvovaginitis likely fungal-unresponsive to Diflucan as per patient   Associated cellulitis   Diabetes mellitus   Asthma   Hyperlipidemia   Obesity    Plan:    Start Mycamine   Continue vancomycin and Zosyn   Fungal cultures for identification   Pharmacy to monitor vancomycin levels   Nystatin powder   Probiotics   Monitor electrolytes and renal function closely   Monitor clinically    Thanks for consultation          Amalia Edgecombe A. Janalyn Rouse, MD   04/13/2015

## 2015-04-13 NOTE — Plan of Care (Signed)
MD in to see patient. Patient drowsy, and did not answer when named called. Patient falling asleep while talking to MD. MD gave new orders for Tylenol.

## 2015-04-13 NOTE — Progress Notes (Signed)
SW called [facility] [phone] to verify if pt had an active case.  Per Erie Noe, pt is not active with them.      SW called [facility] [phone] and pt has an active case, but is not assigned a CM at this time.  Regan Lemming [phone]) was the CM for the most recent case, but has not seen pt since August 2016.  Reuel Boom is not at this office today, but can be reached at [phone].  SW called and Reuel Boom is currently with a patient doing an assessment.  He will return my call upon completion.    SW received a call from Reuel Boom who reports that he isn't able to provide me with any information as he is at a different building.      Per Kendra Opitz, Supervisor at [facility], will be calling SW to discuss case.      SW receive call from [facility] [phone], who reports that pt release form will need to be signed before she can provide details of the pt's history.  She reports that pt is not actively on services.      SW met with pt at bedside to discuss Release of Information Form for [facility].  Pt signed, but reports that she has no recollection of receiving services there.    SW faxed a signed release form to [facility]. Per [facility] the pt was referred to them in 2015 by the [facility] ([facility]eless outreach), but did not meet their criteria for history. She reports that pt has a diagnosis of mood disorder, but does not know if pt has a history of medications.      Detectives from [facility] present to speak with pt.          [name], MSW  Case Management   Care Coordinator 2  x [phone]

## 2015-04-13 NOTE — ED Provider Notes (Signed)
Signed out from DR. Seccurro to c/s ID in the AM.    7:39 AM  D/w Dr. Janalyn Rouse, for c/s.    Jethro Bastos, MD  04/13/15 [phone]

## 2015-04-13 NOTE — Progress Notes (Signed)
Pharmacy Note: Vancomycin Dosing Consult    97.5 F (36.4 C) (Oral)   Recent Labs  Lab 04/13/15  0658 04/13/15  0107   WBC 5.71 8.05   CREATININE  --  0.8     Vancomycin Indication: cellulitis/UTI  Goal Trough: 10-15 mcg/mL    Day of Therapy: 1/TBD   Other Anti-microbials: Piperacillin/tazobactam  Other Nephrotoxic Agents/Conditions: None    Date 12/19 12/20 12/21  12/22 12/23   Administered  Dose (time) 2000 mg x 1 1750 q 12 hours      Level (time)  T @ 1730        Assessment/Plan:  1. Renal function: stable  2. Vancomycin dosing plan: 2000 mg LD + 1750 q 12 hours   3. Level: Trough due 12/20 @ 1730  3. Continue to monitor renal function, WBC, temp, microbiologic data, missed doses, and signs/symptoms of adverse reaction.    Thank you for this consult. Pharmacy will continue to follow this patient's progress with you until consult and/or vancomycin is discontinued.    Victorio Palm, Pharm.D.  Clinical Pharmacist  Phone: [phone]
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
## 2015-04-13 NOTE — Progress Notes (Signed)
Pt stated that her inner side of left ankle and outer side of R ankle hurt when she came back from bathroom 8/10, MD notified.
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
## 2015-04-13 NOTE — Plan of Care (Addendum)
Problem: Safety  Goal: Patient will be free from injury during hospitalization  Outcome: Progressing    Problem: Pain  Goal: Patient's pain/discomfort is manageable  Outcome: Progressing    Comments:   Patient received from ED around 4am via stretcher with dx-recurrent yeast infection,acute UTI,  patient alert and oriented, MAE, drowsy because of pain medication, 1 person assist to BR, fall measures in place, started on IV fluids, IV abx. Will continue to monitor for any acute changes. VSS.

## 2015-04-13 NOTE — ED Notes (Signed)
Pt c/o vaginal swelling x 3 weeks. Pt reports now that the swelling has increased more and noticed bleeding when wiping x 3 days. Pt reports h/o DM and state that her "sugar is up" because she has polyuria, polyphagia and polydipsia. Pt state that she went to [facility] for similar complaints but reports that they did not want to help her.

## 2015-04-13 NOTE — Consults (Signed)
Service Date: 04/13/2015     Patient Type: I     CONSULTING PHYSICIAN: Tenna Child MD     REFERRING PHYSICIAN: Elna Breslow MD     Dorothy [name] is a 39 year old Hispanic female with a history of diabetes  mellitus, asthma, and depression, panic attacks, who has been dealing with  a yeast infection in the genital area for the past 3 months.  According to  the patient, she has received several courses of Diflucan without having  any significant improvement and she complains of pain and swelling and  sometimes vaginal bleeding during in that area.  The rash extends from the  genital area into the back and into the buttocks.  The patient any fevers  or chills, no nausea, vomiting, or diarrhea.  No chest pain, cough, or  shortness of breath.  She was started on Diflucan along with vancomycin and  Zosyn, and genital cultures are ordered and are pending.     PAST MEDICAL HISTORY:  Consistent with diabetes mellitus, asthma, depression, and panic attacks,  hypercholesterolemia.     PAST SURGICAL HISTORY:  C-section, cholecystectomy, tonsillectomy, hernia repair.     ALLERGIES:  MORPHINE AND PERCOCET.      SOCIAL HISTORY:  No smoking, no alcohol.     REVIEW OF SYSTEMS:  The patient has a pruritic rash in the genital area extending into the  buttocks, complains of discharge, no fevers or chills.  No runny nose or  sore throat.  No seizures or syncopal episodes.  No chest pain, cough, or  shortness of breath, no abdominal pain, nausea, vomiting, or diarrhea.   Complains of dysuria.     PHYSICAL EXAMINATION:  GENERAL:  The patient is comfortable.  VITAL SIGNS:  Temperature is 96.6, blood pressure 157/90, respiratory rate  is 87, respiratory rate 16.  HEENT:  Head is normocephalic and atraumatic.  Pupils are reactive to  light.  Sclerae anicteric.  Oral mucosa is normal.  NECK:  Supple.  There is no neck lymphadenopathy, no thyromegaly.  LUNGS:  Clear to auscultation.  HEART:  Sounds are regular rate and rhythm  without any murmur.  ABDOMEN:  Soft, nontender.  There is no organomegaly.  EXTREMITIES:  There is no edema, no clubbing, no cyanosis.  In the genital  area there is significant swelling and erythema and white, wet patches  extending into the buttock area.  NEUROLOGIC:  Grossly intact.     LABORATORY DATA:  White cell count is 5.7, hemoglobin 11.3, hematocrit 33.4, platelet count  278,000, neutrophil count 60%.  Sodium 134, potassium 3.7, chloride 102,  bicarbonate 22, BUN 13, creatinine is 0.8, alkaline phosphatase 93, ALT 13,  AST 12.  UA shows too numerous to count white cells, occasional yeast,  large amount of leukocyte esterase.  Genital cultures are ordered and are  pending.     ASSESSMENT:  1.  Vulvovaginitis, likely fungal, unresponsive to Diflucan as per patient.  2.  Associated cellulitis.  3.  Diabetes mellitus.  4.  Asthma.  5.  Hyperlipidemia.  6.  Obesity.      PLAN:   1.  At this time we will start patient on Mycamine.   2.  Continue vancomycin and Zosyn.  Pharmacy to monitor vancomycin levels.      3.  The wound cultures.  4.  Probiotics.  5.  Monitor electrolytes and renal function closely.  6.  Monitor clinically.     Thank you for the consultation.  D:  04/13/2015 11:51 AM by Dr. Gwynneth Aliment A. Janalyn Rouse, MD [phone])  T:  04/13/2015 12:54 PM by NTS      (Conf: [id number]) (Doc ID: [id number])

## 2015-04-13 NOTE — Plan of Care (Signed)
1. Pain management: Morphine 1 mg every 4 hours prn, added Tylenol 1000 mg every 4 prn     2. Labs/test results (including glucose):         3. DVT Prophylaxis:      4. Ambulation/Activity:    5. Plan of Care:add Tylenol every 4 hours    6. Discharge Planning (PT, OT, rehab, etc):    7.  Palliative Care (if patients screen positive):
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
## 2015-04-14 ENCOUNTER — Inpatient Hospital Stay: Payer: Medicare Other

## 2015-04-14 ENCOUNTER — Other Ambulatory Visit: Payer: Medicare Other

## 2015-04-14 LAB — CBC AND DIFFERENTIAL
Basophils Absolute Automated: 0.02 10*3/uL (ref 0.00–0.20)
Basophils Automated: 0 %
Eosinophils Absolute Automated: 0.22 10*3/uL (ref 0.00–0.70)
Eosinophils Automated: 4 %
Hematocrit: 31.6 % — ABNORMAL LOW (ref 37.0–47.0)
Hgb: 10.6 g/dL — ABNORMAL LOW (ref 12.0–16.0)
Immature Granulocytes Absolute: 0.02 10*3/uL
Immature Granulocytes: 0 %
Lymphocytes Absolute Automated: 1.91 10*3/uL (ref 0.50–4.40)
Lymphocytes Automated: 33 %
MCH: 26.1 pg — ABNORMAL LOW (ref 28.0–32.0)
MCHC: 33.5 g/dL (ref 32.0–36.0)
MCV: 77.8 fL — ABNORMAL LOW (ref 80.0–100.0)
MPV: 9.4 fL (ref 9.4–12.3)
Monocytes Absolute Automated: 0.42 10*3/uL (ref 0.00–1.20)
Monocytes: 7 %
Neutrophils Absolute: 3.24 10*3/uL (ref 1.80–8.10)
Neutrophils: 56 %
Nucleated RBC: 0 /100 WBC (ref 0–1)
Platelets: 272 10*3/uL (ref 140–400)
RBC: 4.06 10*6/uL — ABNORMAL LOW (ref 4.20–5.40)
RDW: 15 % (ref 12–15)
WBC: 5.81 10*3/uL (ref 3.50–10.80)

## 2015-04-14 LAB — GLUCOSE WHOLE BLOOD - POCT
Whole Blood Glucose POCT: 391 mg/dL — ABNORMAL HIGH (ref 70–100)
Whole Blood Glucose POCT: 273 mg/dL — ABNORMAL HIGH (ref 70–100)
Whole Blood Glucose POCT: 277 mg/dL — ABNORMAL HIGH (ref 70–100)
Whole Blood Glucose POCT: 284 mg/dL — ABNORMAL HIGH (ref 70–100)
Whole Blood Glucose POCT: 326 mg/dL — ABNORMAL HIGH (ref 70–100)

## 2015-04-14 LAB — BASIC METABOLIC PANEL
Anion Gap: 6 (ref 5.0–15.0)
BUN: 8 mg/dL (ref 7.0–19.0)
CO2: 22 mEq/L (ref 22–29)
Calcium: 8.7 mg/dL (ref 8.5–10.5)
Chloride: 105 mEq/L (ref 100–111)
Creatinine: 0.7 mg/dL (ref 0.6–1.0)
Glucose: 340 mg/dL — ABNORMAL HIGH (ref 70–100)
Potassium: 4.1 mEq/L (ref 3.5–5.1)
Sodium: 133 mEq/L — ABNORMAL LOW (ref 136–145)

## 2015-04-14 LAB — GFR: EGFR: >60

## 2015-04-14 LAB — VANCOMYCIN, TROUGH: Vancomycin Trough: 8.7 ug/mL — ABNORMAL LOW (ref 10.0–20.0)

## 2015-04-14 LAB — HEMOLYSIS INDEX: Hemolysis Index: 6 (ref 0–18)

## 2015-04-14 MED ORDER — HYDROXYZINE PAMOATE 25 MG PO CAPS
25.0000 mg | ORAL_CAPSULE | Freq: Four times a day (QID) | ORAL | Status: DC | PRN
Start: 2015-04-14 — End: 2015-04-17
  Administered 2015-04-14 – 2015-04-16 (×3): 25 mg via ORAL
  Filled 2015-04-14 (×4): qty 1

## 2015-04-14 MED ORDER — MORPHINE SULFATE 2 MG/ML IJ/IV SOLN (WRAP)
1.0000 mg | Freq: Once | Status: AC
Start: 2015-04-14 — End: 2015-04-14
  Administered 2015-04-14: 1 mg via INTRAVENOUS
  Filled 2015-04-14: qty 1

## 2015-04-14 MED ORDER — INSULIN GLARGINE 100 UNIT/ML SC SOLN
20.0000 [IU] | Freq: Every morning | SUBCUTANEOUS | Status: DC
Start: 2015-04-14 — End: 2015-04-14

## 2015-04-14 MED ORDER — MORPHINE SULFATE 2 MG/ML IJ/IV SOLN (WRAP)
1.0000 mg | Freq: Four times a day (QID) | Status: DC | PRN
Start: 2015-04-14 — End: 2015-04-17
  Administered 2015-04-15 – 2015-04-17 (×8): 1 mg via INTRAVENOUS
  Filled 2015-04-14 (×10): qty 1

## 2015-04-14 MED ORDER — INSULIN GLARGINE 100 UNIT/ML SC SOLN
30.0000 [IU] | Freq: Every morning | SUBCUTANEOUS | Status: DC
Start: 2015-04-14 — End: 2015-04-16
  Administered 2015-04-14 – 2015-04-16 (×3): 30 [IU] via SUBCUTANEOUS
  Filled 2015-04-14 (×3): qty 30

## 2015-04-14 MED ORDER — KETOROLAC TROMETHAMINE 30 MG/ML IJ SOLN
30.0000 mg | Freq: Four times a day (QID) | INTRAMUSCULAR | Status: DC | PRN
Start: 2015-04-14 — End: 2015-04-17
  Administered 2015-04-15: 30 mg via INTRAVENOUS
  Filled 2015-04-14: qty 1

## 2015-04-14 MED ORDER — KETOROLAC TROMETHAMINE 15 MG/ML IJ SOLN
15.0000 mg | Freq: Four times a day (QID) | INTRAMUSCULAR | Status: DC | PRN
Start: 2015-04-14 — End: 2015-04-14
  Administered 2015-04-14 (×2): 15 mg via INTRAVENOUS
  Filled 2015-04-14 (×2): qty 1

## 2015-04-14 NOTE — Plan of Care (Signed)
Problem: Pain  Goal: Patient's pain/discomfort is manageable  Outcome: Progressing  Patient receiving pain medication morphine 1 mg iv every four hours. Her vital sign within normal limites and no respiratory diesters noted.   Intervention: Include patient/family/caregiver in decisions related to pain management  .  Intervention: Offer non-pharmocological pain management interventions  .      Problem: Infection  Goal: Remains infection free  Outcome: Progressing  Patient has yeast infection, redness to perineal area , the area cleaned and applied nystatin powder. She is also receiving antibiotics   Plan-: continue treating patient with antibiotics and antifungal treatment.
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
## 2015-04-14 NOTE — Consults (Signed)
Nutritional Support Services  Nutrition Education    Referral Source: MD Consult   Reason for Referral: DM    Who was educated:  Patient, Patient's sister    Readiness to learn:  Health and safety inspector    Education Details:  Pt admits her current social situation makes it difficult to watch her diet.  Pt states for the past 6 months she has been unable to check blood sugars regularly as well as take medication.  Case management consulted.    Pt admits to utilizing food as a form of comfort/numbing during this time on a regular basis.    RD reviewed CHO consistency, with pt as well as the need to deal with the emotional component that is causing her to utilize food to cope.  RD reviewed Overeaters Anonymous- brochure given for support.  Pt receptive to information.    Sister requested list of foods to help when she is on the go- handout given    Type of Instruction Provided:  Verbal & Written      Level of Understanding:  Verbalizes understanding    RD to follow up per nutrition protocol or by consult,    Beryl Hornberger L. Emanuelle Bastos, MS, RDN, LDN, NCC  Spectralink x [phone]  Nutrition Office x [phone]

## 2015-04-14 NOTE — Progress Notes (Signed)
Infectious Diseases & Tropical Medicine  Progress Note    04/14/2015   Dorothy [name] ZOX:[id number] is a 39 y.o. female,       Assessment:      Vulvovaginitis likely fungal-unresponsive to Diflucan    Associated cellulitis   Diabetes mellitus   Asthma   Hyperlipidemia   Obesity    Plan:      Continue Mycamine while awaiting fungal cultures   Continue vancomycin and Zosyn   Pharmacy monitoring vancomycin levels   Continue nystatin powder   Continue probiotics   Monitor electrolytes and renal function closely   Monitor clinically   Discussed with patient in detail    ROS:     General:  no fever, no chills, no rigor, awake and alert, feeling better, ambulating   HEENT: no neck pain, no throat pain  Endocrine: No fatigue   Respiratory: no cough, shortness of breath, or wheezing   Cardiovascular: no chest pain   Gastrointestinal: no abdominal pain,no N/V/D  Genito-Urinary: no dysuria, trouble voiding, or hematuria   Musculoskeletal: Pain in the genital area  Neurological: c/o generalized weakness   Dermatological: No open wound    Physical Examination:     Blood pressure 120/70, pulse 82, temperature 96.5 F (35.8 C), temperature source Oral, resp. rate 18, height 1.803 m (5\' 11" ), weight 146.013 kg (321 lb 14.4 oz), last menstrual period 03/20/2015, SpO2 97 %.     General Appearance: Comfortable, and in no acute distress   HEENT: Pupils are equal, round, and reactive to light.    Lungs:  CTA   Heart: RRR   Chest: Symmetric chest wall expansion.    Abdomen: soft ,non tender,no hepatosplenomegaly   Neurological: No focal deficit   Extremities: No edema, positive edema and erythema of the vulvar area, rash involving the genital area extending into the buttocks     Laboratory And Diagnostic Studies:     Recent Labs      04/14/15   0512  04/13/15   0658   WBC  5.81  5.71   HGB  10.6*  11.3*   HEMATOCRIT  31.6*  33.4*   PLATELETS  272  278     Recent Labs      04/14/15   0512   04/13/15   0658   SODIUM  133*  134*   POTASSIUM  4.1  3.7   CHLORIDE  105  102   CO2  22  22   BUN  8.0  13.0   CREATININE  0.7  0.8   GLUCOSE  340*  322*   CALCIUM  8.7  8.7     Recent Labs      04/13/15   0107   AST (SGOT)  12   ALT  13   ALKALINE PHOSPHATASE  93   PROTEIN, TOTAL  7.0   ALBUMIN  3.4*       Current Meds:      Scheduled Meds: PRN Meds:      diphenhydrAMINE 25 mg Oral Once   insulin glargine 30 Units Subcutaneous QAM   lactobacillus/streptococcus 1 capsule Oral Daily   lisinopril 20 mg Oral Daily   micafungin 100 mg Intravenous Q24H SCH   nystatin 1 application Topical BID   piperacillin-tazobactam 4.5 g Intravenous Q8H SCH   vancomycin 1,750 mg Intravenous Q12H       Continuous Infusions:  . sodium chloride 100 mL/hr at 04/13/15 2343      acetaminophen 1,000 mg Q4H PRN  dextrose 25 mL PRN   glucagon (rDNA) 1 mg PRN   influenza 0.5 mL Once PRN   insulin aspart 1-5 Units PRN   ketorolac 15 mg Q6H PRN   naloxone 0.2 mg PRN   ondansetron 4 mg Q8H PRN   Or     ondansetron 4 mg Q8H PRN         Lynita Groseclose A. Janalyn Rouse, M.D.  04/14/2015  9:25 AM
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
## 2015-04-14 NOTE — Plan of Care (Signed)
Problem: Pain  Goal: Patient's pain/discomfort is manageable  Outcome: Progressing  Pt AOX4. VS are stable. Afebrile. Pt c/o bilateral ankle pain and buttocks/groin area, PRN IV toradol and tylenol given with somewhat relief. Pt is requesting for stronger pain medications, MD aware. Pt c/o itching to groin area, MD notifiy, PRN vestaril started and given with good relief. Buttocks and bilateral groin area redness, raw, painful to touch, applied nystatin powder to area. Cleaned with barrier wipes. No respiratory distress noted. On IVF and IV abx.   Plan: Will continue to assess pain level and monitor for any acute changes

## 2015-04-15 LAB — GFR: EGFR: >60

## 2015-04-15 LAB — BASIC METABOLIC PANEL
Anion Gap: 6 (ref 5.0–15.0)
BUN: 9 mg/dL (ref 7.0–19.0)
CO2: 21 mEq/L — ABNORMAL LOW (ref 22–29)
Calcium: 8.5 mg/dL (ref 8.5–10.5)
Chloride: 108 mEq/L (ref 100–111)
Creatinine: 0.7 mg/dL (ref 0.6–1.0)
Glucose: 270 mg/dL — ABNORMAL HIGH (ref 70–100)
Potassium: 3.9 mEq/L (ref 3.5–5.1)
Sodium: 135 mEq/L — ABNORMAL LOW (ref 136–145)

## 2015-04-15 LAB — CBC AND DIFFERENTIAL
Basophils Absolute Automated: 0.02 10*3/uL (ref 0.00–0.20)
Basophils Automated: 0 %
Eosinophils Absolute Automated: 0.26 10*3/uL (ref 0.00–0.70)
Eosinophils Automated: 4 %
Hematocrit: 30.1 % — ABNORMAL LOW (ref 37.0–47.0)
Hgb: 10.1 g/dL — ABNORMAL LOW (ref 12.0–16.0)
Immature Granulocytes Absolute: 0.01 10*3/uL
Immature Granulocytes: 0 %
Lymphocytes Absolute Automated: 2.34 10*3/uL (ref 0.50–4.40)
Lymphocytes Automated: 36 %
MCH: 26 pg — ABNORMAL LOW (ref 28.0–32.0)
MCHC: 33.6 g/dL (ref 32.0–36.0)
MCV: 77.6 fL — ABNORMAL LOW (ref 80.0–100.0)
MPV: 10.1 fL (ref 9.4–12.3)
Monocytes Absolute Automated: 0.4 10*3/uL (ref 0.00–1.20)
Monocytes: 6 %
Neutrophils Absolute: 3.4 10*3/uL (ref 1.80–8.10)
Neutrophils: 53 %
Nucleated RBC: 0 /100 WBC (ref 0–1)
Platelets: 254 10*3/uL (ref 140–400)
RBC: 3.88 10*6/uL — ABNORMAL LOW (ref 4.20–5.40)
RDW: 15 % (ref 12–15)
WBC: 6.42 10*3/uL (ref 3.50–10.80)

## 2015-04-15 LAB — GLUCOSE WHOLE BLOOD - POCT
Whole Blood Glucose POCT: 279 mg/dL — ABNORMAL HIGH (ref 70–100)
Whole Blood Glucose POCT: 203 mg/dL — ABNORMAL HIGH (ref 70–100)
Whole Blood Glucose POCT: 265 mg/dL — ABNORMAL HIGH (ref 70–100)
Whole Blood Glucose POCT: 341 mg/dL — ABNORMAL HIGH (ref 70–100)
Whole Blood Glucose POCT: 299 mg/dL — ABNORMAL HIGH (ref 70–100)

## 2015-04-15 LAB — HEMOLYSIS INDEX: Hemolysis Index: 0 (ref 0–18)

## 2015-04-15 MED ORDER — HYDROCORTISONE 1 % EX OINT
TOPICAL_OINTMENT | Freq: Two times a day (BID) | CUTANEOUS | Status: DC
Start: 2015-04-15 — End: 2015-04-17
  Filled 2015-04-15: qty 28.35

## 2015-04-15 MED ORDER — VANCOMYCIN HCL 1000 MG IV SOLR
1500.0000 mg | Freq: Three times a day (TID) | INTRAVENOUS | Status: DC
Start: 2015-04-15 — End: 2015-04-17
  Administered 2015-04-15 – 2015-04-17 (×5): 1500 mg via INTRAVENOUS
  Filled 2015-04-15 (×7): qty 1500

## 2015-04-15 NOTE — Consults (Signed)
Pharmacy Note: Vancomycin Dosing Consult    Height Weight Renal Function   1.803 m (5\' 11" ) 146.693 kg (323 lb 6.4 oz) CREATININE: 0.7 (04/15/15 0426)  Estimated creatinine clearance - 175.8 mL/min     97 F (36.1 C) (Oral)   Recent Labs  Lab 04/15/15  0426 04/14/15  1730 04/14/15  0512 04/13/15  0658 04/13/15  0107   WBC 6.42  --  5.81 5.71 8.05   CREATININE 0.7  --  0.7 0.8 0.8   VANCOMYCIN TROUGH  --  8.7*  --   --   --      Vancomycin Dosing Guidelines  Vancomycin Monitoring Protocol    Pharmacy was consulted on 12/19 by Dr.Vickery to dose vancomycin.  Vancomycin Indication: cellulitis  Goal Trough: 10-15 mcg/mL    Cultures: negative chlamydia/trichomonas  Day of Therapy:  3  Other Anti-microbials: Piperacillin/tazobactam  Other Nephrotoxic Agents/Conditions: Piperacillin/tazobactam    Assessment/Plan:  1. Renal function  Recent Labs      04/15/15   0426  04/14/15   0512  04/13/15   0658  04/13/15   0107   CREATININE  0.7  0.7  0.8  0.8     2. Trough/random level assessment:   Recent Labs      04/14/15   1730   VANCOMYCIN TROUGH  8.7*     3. Vancomycin dosing plan: change vanco to 1500mg  q8h based on low T level, T ordered for 3 pm (04/16/15)  4. Continue to monitor renal function, WBC, temp, microbiologic data, missed doses, and signs/symptoms of adverse reaction.      Thank you for this consult. Pharmacy will continue to follow this patient's progress with you until consult and/or vancomycin is discontinued.    Unk Lightning

## 2015-04-15 NOTE — Progress Notes (Signed)
Infectious Diseases & Tropical Medicine  Progress Note    04/15/2015   Rise Traeger ZOX:[id number],XBJ:[id number] is a 39 y.o. female,       Assessment:      Vulvovaginitis likely fungal-unresponsive to Diflucan    Fungal cultures pending   Associated cellulitis slowly improving   Diabetes mellitus   Asthma   Hyperlipidemia   Obesity    Plan:      Continue Mycamine while awaiting fungal cultures   Continue vancomycin and Zosyn   Pharmacy monitoring vancomycin levels   Continue nystatin powder   Hydrocortisone ointment in the left thigh area   Continue probiotics   Monitor electrolytes and renal function closely   Monitor clinically   Discussed with patient in detail    ROS:     General:  no fever, no chills, no rigor, awake and alert, comfortable  HEENT: no neck pain, no throat pain  Endocrine: No fatigue   Respiratory: no cough, shortness of breath, or wheezing   Cardiovascular: no chest pain   Gastrointestinal: no abdominal pain,no N/V/D  Genito-Urinary: no dysuria, trouble voiding, or hematuria, complains of bleeding during wiping the genital area   Musculoskeletal: Pain in the genital area  Neurological: c/o generalized weakness   Dermatological: No open wound, complains of itching in the left inner thigh    Physical Examination:     Blood pressure 119/71, pulse 83, temperature 97 F (36.1 C), temperature source Oral, resp. rate 19, height 1.803 m (5\' 11" ), weight 146.693 kg (323 lb 6.4 oz), last menstrual period 03/20/2015, SpO2 97 %.     General Appearance: Comfortable, and in no acute distress   HEENT: Pupils are equal, round, and reactive to light.    Lungs:  Decreased breath sounds   Heart: RRR   Chest: Symmetric chest wall expansion.    Abdomen: soft ,non tender,no hepatosplenomegaly   Neurological: No focal deficit   Extremities: edema and erythema of the vulvar area slowly improving, rash involving the genital area extending into the buttocks     Laboratory And Diagnostic  Studies:     Recent Labs      04/15/15   0426  04/14/15   0512   WBC  6.42  5.81   HGB  10.1*  10.6*   HEMATOCRIT  30.1*  31.6*   PLATELETS  254  272     Recent Labs      04/15/15   0426  04/14/15   0512   SODIUM  135*  133*   POTASSIUM  3.9  4.1   CHLORIDE  108  105   CO2  21*  22   BUN  9.0  8.0   CREATININE  0.7  0.7   GLUCOSE  270*  340*   CALCIUM  8.5  8.7     Recent Labs      04/13/15   0107   AST (SGOT)  12   ALT  13   ALKALINE PHOSPHATASE  93   PROTEIN, TOTAL  7.0   ALBUMIN  3.4*       Current Meds:      Scheduled Meds: PRN Meds:        insulin glargine 30 Units Subcutaneous QAM   lactobacillus/streptococcus 1 capsule Oral Daily   lisinopril 20 mg Oral Daily   micafungin 100 mg Intravenous Q24H SCH   nystatin 1 application Topical BID   piperacillin-tazobactam 4.5 g Intravenous Q8H SCH   vancomycin 1,750 mg Intravenous Q12H  Continuous Infusions:  . sodium chloride 100 mL/hr at 04/14/15 1120      acetaminophen 1,000 mg Q4H PRN   dextrose 25 mL PRN   glucagon (rDNA) 1 mg PRN   hydrOXYzine 25 mg Q6H PRN   influenza 0.5 mL Once PRN   insulin aspart 1-5 Units PRN   ketorolac 30 mg Q6H PRN   morphine 1 mg Q6H PRN   naloxone 0.2 mg PRN   ondansetron 4 mg Q8H PRN   Or     ondansetron 4 mg Q8H PRN         Idalys Konecny A. Janalyn Rouse, M.D.  04/15/2015  8:58 AM

## 2015-04-15 NOTE — Plan of Care (Signed)
Problem: Tissue integrity  Goal: Damaged tissue is healing and protected  Outcome: Progressing  Pt AOX4. VS are stable. Afebrile. Pt has PRN IV morphine available, explained about s/e, verbalized understanding. Pt c/o buttocks/groin area itching, has PRN vestaril. Buttocks and bilateral groin area redness, raw, painful to touch, applied nystatin powder to area. Steroid ointment cream applied to lower thigh. Cleaned with barrier wipes. No respiratory distress noted. On IV abx. Seem by ID.  Plan: Will continue to assess pain level and skin integrity
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
## 2015-04-15 NOTE — Plan of Care (Addendum)
Problem: Pain  Goal: Patient's pain/discomfort is manageable  Outcome: Progressing  Patient received  pain medication morphine 1 mg iv every six  hours. Her vital sign within normal limites and no respiratory diesters noted.   .    Intervention: Include patient/family/caregiver in decisions related to pain management  .  Intervention: Offer non-pharmocological pain management interventions  .  Problem: Infection  Goal: Remains infection free  Outcome: Progressing  Patient has yeast infection, redness to perineal area , the area cleaned and applied nystatin powder. She is also receiving antibiotics    Plan-: continue treating patient with antibiotics and antifungal treatment.

## 2015-04-16 LAB — GLUCOSE WHOLE BLOOD - POCT
Whole Blood Glucose POCT: 242 mg/dL — ABNORMAL HIGH (ref 70–100)
Whole Blood Glucose POCT: 314 mg/dL — ABNORMAL HIGH (ref 70–100)
Whole Blood Glucose POCT: 251 mg/dL — ABNORMAL HIGH (ref 70–100)

## 2015-04-16 MED ORDER — INSULIN GLARGINE 100 UNIT/ML SC SOLN
37.0000 [IU] | Freq: Every morning | SUBCUTANEOUS | Status: DC
Start: 2015-04-17 — End: 2015-04-17
  Administered 2015-04-17: 37 [IU] via SUBCUTANEOUS
  Filled 2015-04-16: qty 37

## 2015-04-16 MED ORDER — INSULIN ASPART 100 UNIT/ML SC SOLN
3.0000 [IU] | Freq: Three times a day (TID) | SUBCUTANEOUS | Status: DC
Start: 2015-04-16 — End: 2015-04-17
  Administered 2015-04-16 – 2015-04-17 (×4): 3 [IU] via SUBCUTANEOUS
  Filled 2015-04-16 (×4): qty 3

## 2015-04-16 MED ORDER — INSULIN GLARGINE 100 UNIT/ML SC SOLN
35.0000 [IU] | Freq: Every morning | SUBCUTANEOUS | Status: DC
Start: 2015-04-17 — End: 2015-04-16
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
## 2015-04-16 NOTE — Progress Notes (Signed)
While getting iv antibiotic/ vanco, patient complained of IV access hurting her. Iv access was started being infiltrated. Antibiotics stopped. Attempted to restart an iv access with help of 2 Clin-techs without success. IV team called and was able to insert a 22 G in the right AC at 1730. IV abx restarted. Patient tolerated. At 1815, this nurse went to check on patient, when ask patient, patient stated: "My IV is ok, the catheter is good, but the blue thing is uncomfortable. I can not bind my arm. If you could pull it out a little bit" This nurse explained that pull the blue part of the IV is connected to the IV. The choice is to either remove the catheter completely or to keep it.   Patient started being verbally aggressive and screaming toward this nurse. Was reminded of mutual respect and communication without raising her voice. Patient continued screaming toward this nurse and this nurse had to get out of the room. Patient yelled again started :" Are you walking away from me, I am going to give you a hard time. You don't know me". This nurse reported the even to the charge nurse, Byrd Hesselbach and Facilities manager, Randolph Bing made aware and had to talk with patient.   On coming nurse given report, will continue with patient's care.

## 2015-04-16 NOTE — Plan of Care (Addendum)
Problem: Safety  Goal: Patient will be free from injury during hospitalization  Outcome: Progressing  Patient is alert and oriented x 4. VSS. Denies  N/V, CP and SOB.  pt is on RA. OOB x 1 assist. Call light within reach, falls precaution maintained, bed in the lowest position. Will continue to monitor patient closely.   Problem: Potential for Compromised Skin Integrity  Goal: Skin integrity is maintained or improved  Assess and monitor skin integrity. Identify patients at risk for skin breakdown on admission and per policy. Collaborate with interdisciplinary team and initiate plans and interventions as needed.   Outcome: Progressing  Buttocks and groin area redness, painful and raw. Pt educated to pat dry area after going to the bathroom. Cleaned with barrier wipes. Pt is on antibiotics and antifungal medication. Will continue to monitor.
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
## 2015-04-16 NOTE — Progress Notes (Signed)
Infectious Diseases & Tropical Medicine  Progress Note    04/16/2015   Dorothy [name] ZOX:[id number] is a 39 y.o. female,       Assessment:      Vulvovaginitis likely fungal-unresponsive to Diflucan    Fungal cultures still pending   Associated cellulitis slowly improving   Diabetes mellitus   Asthma   Hyperlipidemia   Obesity    Plan:      Continue Mycamine while awaiting fungal cultures   Continue vancomycin and Zosyn   Pharmacy monitoring vancomycin levels   Continue nystatin powder   Hydrocortisone ointment in the left thigh area   Continue probiotics   Monitor electrolytes and renal function closely   Monitor clinically   Discussed with patient in detail   Discussed with Dr.Famakinwa    ROS:     General:  no fever, no chills, no rigor, awake and alert, comfortable  HEENT: no neck pain, no throat pain  Endocrine: No fatigue   Respiratory: no cough, shortness of breath, or wheezing   Cardiovascular: no chest pain   Gastrointestinal: no abdominal pain,no N/V/D  Genito-Urinary: no dysuria, trouble voiding, or hematuria, still complaining of bleeding during wiping the genital area   Musculoskeletal: Pain in the genital area  Neurological: c/o generalized weakness   Dermatological: No open wound, complains of itching in the left inner thigh    Physical Examination:     Blood pressure 108/71, pulse 80, temperature 97.5 F (36.4 C), temperature source Oral, resp. rate 18, height 1.803 m (5\' 11" ), weight 149.25 kg (329 lb 0.6 oz), last menstrual period 03/20/2015, SpO2 98 %.     General Appearance: Comfortable, and in no acute distress   HEENT: Pupils are equal, round, and reactive to light.    Lungs:  Clear to auscultation   Heart: RRR   Chest: Symmetric chest wall expansion.    Abdomen: soft ,non tender,no hepatosplenomegaly   Neurological: No focal deficit   Extremities: edema and erythema of the vulvar area slowly improving, rash involving the genital area extending into the  buttocks     Laboratory And Diagnostic Studies:     Recent Labs      04/15/15   0426  04/14/15   0512   WBC  6.42  5.81   HGB  10.1*  10.6*   HEMATOCRIT  30.1*  31.6*   PLATELETS  254  272     Recent Labs      04/15/15   0426  04/14/15   0512   SODIUM  135*  133*   POTASSIUM  3.9  4.1   CHLORIDE  108  105   CO2  21*  22   BUN  9.0  8.0   CREATININE  0.7  0.7   GLUCOSE  270*  340*   CALCIUM  8.5  8.7     No results for input(s): AST, ALT, ALKPHOS, PROT, ALB in the last 72 hours.    Current Meds:      Scheduled Meds: PRN Meds:        hydrocortisone  Topical BID   insulin glargine 30 Units Subcutaneous QAM   lactobacillus/streptococcus 1 capsule Oral Daily   lisinopril 20 mg Oral Daily   micafungin 100 mg Intravenous Q24H SCH   nystatin 1 application Topical BID   piperacillin-tazobactam 4.5 g Intravenous Q8H SCH   vancomycin 1,500 mg Intravenous Q8H       Continuous Infusions:      acetaminophen 1,000 mg Q4H PRN  dextrose 25 mL PRN   glucagon (rDNA) 1 mg PRN   hydrOXYzine 25 mg Q6H PRN   influenza 0.5 mL Once PRN   insulin aspart 1-5 Units PRN   ketorolac 30 mg Q6H PRN   morphine 1 mg Q6H PRN   naloxone 0.2 mg PRN   ondansetron 4 mg Q8H PRN   Or     ondansetron 4 mg Q8H PRN         [name] A. Janalyn Rouse, M.D.  04/16/2015  9:04 AM

## 2015-04-16 NOTE — Plan of Care (Signed)
Problem: Pain  Goal: Patient's pain/discomfort is manageable  Outcome: Progressing  C/o groin and vaginal area pain, morphine administered per MAR. Skin very raw, nystatin powder applied.

## 2015-04-16 NOTE — Plan of Care (Signed)
Problem: Safety  Goal: Patient will be free from injury during hospitalization  Outcome: Progressing  Patient ambulate with stand by assist. Aware of fall and safety measures. Will continue to monitor.     Problem: Pain  Goal: Patient's pain/discomfort is manageable  Outcome: Progressing  Patient with vaginal and groin area redness and tenderness. On multiple antibiotics. Area cleaned and applied nystatin and hydrocortisone cream.

## 2015-04-17 LAB — VANCOMYCIN, TROUGH: Vancomycin Trough: 12.6 ug/mL (ref 10.0–20.0)

## 2015-04-17 LAB — GLUCOSE WHOLE BLOOD - POCT
Whole Blood Glucose POCT: 288 mg/dL — ABNORMAL HIGH (ref 70–100)
Whole Blood Glucose POCT: 262 mg/dL — ABNORMAL HIGH (ref 70–100)
Whole Blood Glucose POCT: 265 mg/dL — ABNORMAL HIGH (ref 70–100)
Whole Blood Glucose POCT: 332 mg/dL — ABNORMAL HIGH (ref 70–100)
Whole Blood Glucose POCT: 355 mg/dL — ABNORMAL HIGH (ref 70–100)

## 2015-04-17 MED ORDER — FREESTYLE SYSTEM KIT
PACK | Status: DC
Start: 2015-04-17 — End: 2015-09-02

## 2015-04-17 MED ORDER — INSULIN GLARGINE 100 UNIT/ML SC SOPN
45.0000 [IU] | PEN_INJECTOR | Freq: Every day | SUBCUTANEOUS | Status: DC
Start: 2015-04-17 — End: 2015-08-09

## 2015-04-17 MED ORDER — FLUCONAZOLE 200 MG PO TABS
200.0000 mg | ORAL_TABLET | Freq: Every day | ORAL | Status: AC
Start: 2015-04-17 — End: 2015-05-01

## 2015-04-17 MED ORDER — AMOXICILLIN-POT CLAVULANATE 875-125 MG PO TABS
1.0000 | ORAL_TABLET | Freq: Two times a day (BID) | ORAL | Status: DC
Start: 2015-04-17 — End: 2015-04-17

## 2015-04-17 MED ORDER — INSULIN GLARGINE 100 UNIT/ML SC SOLN
45.0000 [IU] | Freq: Every day | SUBCUTANEOUS | Status: DC
Start: 2015-04-17 — End: 2015-04-17

## 2015-04-17 MED ORDER — NYSTOP 100000 UNIT/GM EX POWD
1.0000 | Freq: Two times a day (BID) | CUTANEOUS | Status: AC
Start: 2015-04-17 — End: 2015-05-01

## 2015-04-17 MED ORDER — INSULIN GLARGINE 100 UNIT/ML SC SOPN
45.0000 [IU] | PEN_INJECTOR | Freq: Every evening | SUBCUTANEOUS | Status: DC
Start: 2015-04-17 — End: 2015-04-17

## 2015-04-17 MED ORDER — FREESTYLE TEST VI STRP
ORAL_STRIP | Status: DC
Start: 2015-04-17 — End: 2015-04-17

## 2015-04-17 MED ORDER — FREESTYLE LANCETS MISC
Status: DC
Start: 2015-04-17 — End: 2015-09-02

## 2015-04-17 MED ORDER — LISINOPRIL 20 MG PO TABS
20.0000 mg | ORAL_TABLET | Freq: Every day | ORAL | Status: AC
Start: 2015-04-17 — End: 2015-05-17

## 2015-04-17 MED ORDER — FREESTYLE TEST VI STRP
ORAL_STRIP | Status: DC
Start: 2015-04-17 — End: 2015-09-02

## 2015-04-17 MED ORDER — RISAQUAD PO CAPS
1.0000 | ORAL_CAPSULE | Freq: Every day | ORAL | Status: AC
Start: 2015-04-17 — End: 2015-04-24

## 2015-04-17 MED ORDER — INSULIN ASPART 100 UNIT/ML SC SOLN
3.0000 [IU] | Freq: Three times a day (TID) | SUBCUTANEOUS | Status: DC
Start: 2015-04-17 — End: 2015-09-02

## 2015-04-17 MED ORDER — METFORMIN HCL 500 MG PO TABS
1000.0000 mg | ORAL_TABLET | Freq: Two times a day (BID) | ORAL | Status: DC
Start: 2015-04-17 — End: 2015-08-09

## 2015-04-17 NOTE — Progress Notes (Addendum)
Infectious Diseases & Tropical Medicine  Progress Note    04/17/2015   Dorothy [name] [id number] is a 39 y.o. female,       Assessment:      Vulvovaginitis likely fungal-unresponsive to Diflucan    Fungal cultures-Candida species as per microbiology lab, still awaiting identification   Cellulitis significantly improved   Diabetes mellitus   Asthma   Hyperlipidemia   Obesity.   Clinically improved    Plan:      Continue Mycamine while awaiting identification of the candidate   Continue vancomycin and Zosyn   Pharmacy monitoring vancomycin levels   Continue nystatin powder   Hydrocortisone ointment in the left thigh area   Continue probiotics   Monitor electrolytes and renal function closely   Monitor clinically   Discussed with patient in detail   Discussed with microbiology lab    ROS:     General:  no fever, no chills, no rigor, awake and alert, comfortable  HEENT: no neck pain, no throat pain  Endocrine: No fatigue   Respiratory: no cough, shortness of breath, or wheezing   Cardiovascular: no chest pain   Gastrointestinal: no abdominal pain,no N/V/D  Genito-Urinary: no dysuria, trouble voiding, or hematuria, no more bleeding from the genital area  Musculoskeletal: Pain in the genital area  Neurological: c/o generalized weakness   Dermatological: No open wound    Physical Examination:     Blood pressure 124/73, pulse 84, temperature 98.5 F (36.9 C), temperature source Oral, resp. rate 18, height 1.803 m (5\' 11" ), weight 149.895 kg (330 lb 7.3 oz), last menstrual period 03/20/2015, SpO2 95 %.     General Appearance: Comfortable, and in no acute distress   HEENT: Pupils are equal, round, and reactive to light.    Lungs:  Decreased breath sounds   Heart: RRR   Chest: Symmetric chest wall expansion.    Abdomen: soft ,non tender,no hepatosplenomegaly   Neurological: No focal deficit   Extremities: edema and erythema of the vulvar area significantly improved, rash involving  the genital area extending into the buttocks improving     Laboratory And Diagnostic Studies:     Recent Labs      04/15/15   0426   WBC  6.42   HGB  10.1*   HEMATOCRIT  30.1*   PLATELETS  254     Recent Labs      04/15/15   0426   SODIUM  135*   POTASSIUM  3.9   CHLORIDE  108   CO2  21*   BUN  9.0   CREATININE  0.7   GLUCOSE  270*   CALCIUM  8.5     No results for input(s): AST, ALT, ALKPHOS, PROT, ALB in the last 72 hours.    Current Meds:      Scheduled Meds: PRN Meds:        hydrocortisone  Topical BID   insulin aspart 3 Units Subcutaneous TID AC   insulin glargine 37 Units Subcutaneous QAM   lactobacillus/streptococcus 1 capsule Oral Daily   lisinopril 20 mg Oral Daily   micafungin 100 mg Intravenous Q24H SCH   nystatin 1 application Topical BID   piperacillin-tazobactam 4.5 g Intravenous Q8H SCH   vancomycin 1,500 mg Intravenous Q8H       Continuous Infusions:      acetaminophen 1,000 mg Q4H PRN   dextrose 25 mL PRN   glucagon (rDNA) 1 mg PRN   hydrOXYzine 25 mg Q6H PRN   influenza  0.5 mL Once PRN   insulin aspart 1-5 Units PRN   ketorolac 30 mg Q6H PRN   morphine 1 mg Q6H PRN   naloxone 0.2 mg PRN   ondansetron 4 mg Q8H PRN   Or     ondansetron 4 mg Q8H PRN         Ahleah Simko A. Janalyn Rouse, M.D.  04/17/2015  7:55 AM

## 2015-04-17 NOTE — Discharge Instr - Activity (Signed)
As tolerated

## 2015-04-17 NOTE — Plan of Care (Signed)
Problem: Safety  Goal: Patient will be free from injury during hospitalization  Outcome: Progressing    Problem: Pain  Goal: Patient's pain/discomfort is manageable  Outcome: Progressing    Problem: Tissue integrity  Goal: Damaged tissue is healing and protected  Outcome: Progressing    Comments:   Patient alert and oriented, MAE, stand by assist when OOB, continent, continue antifungal, IV abx for yeast infection, vaginal, perianal, groin rash and swelling improving per pt, nystatin powder applied, hydrocortisone cream for itching. Pt c/o pain at rash site, medicated with prn pain meds, IV morphine with good relief. Pt refused for lab drawn, spoke with pt, charge RN made aware, educated on the importance of lab drawn for dosing the IV vancomycin, agreed to blood draw once, vanco draw 12.6, per pharmacist IV vancomycin given. VSS. Will continue to monitor for any acute changes

## 2015-04-17 NOTE — Discharge Instructions (Signed)
[facility] DISCHARGE INSTRUCTIONS     Date of Admission: 04/13/2015    Date of Discharge: 04/17/2015    Discharge Physician: Elna Breslow    You were admitted to [facility] for *** Recurrent candidiasis of vagina. It is important that you make your follow up appointments listed below and take your medications as prescribed.      If you are unable to obtain an appointment, unable to obtain newly prescribed medications, or are unclear about any of your discharge instructions please contact your discharging physician at [phone] (M-F, 8am-3pm) or weekends and after hours via the hospital operator [phone], hospital case manager, or your primary care physician.    Follow up Appointments  Follow-up Information     Follow up with [facility].    Why:  Please call on Tuesday morning to make an appointment.     Contact information:    [address]  [address]  [place] [address]  [phone]          Consultants: Infection Disease    Pending Studies: None    Further Instructions: Please be sure to use your insulin and check your blood sugar twice a day. If you feel lightheaded, dizzy, sweaty, or have a headache, this may be a sign of low blood sugar and you should drink juice. If your blood sugar goes over 450, please seek medical attention immediately.     Discharge Diet: Diabetic diet (low carbohydrates, low fats)    Surgery/Procedure: None        Exercise to Manage Your Blood Sugar  Being physically active every day can help you manage your blood sugar. That's because an active lifestyle can improve your body's ability to use insulin. Daily activity can also help delay or prevent complications of diabetes. And it's a great way to relieve stress. If you aren't normally active, be sure to consult your health care provider before getting started.  How Much Activity Do You Need?  If daily activity is new to you, start slow  and steady. Begin with89minutes of activity each day. Then work up to at least40 minutes of moderate to high intensity physical activity on at least 3 to 4 days each week. Do this by adding a few minutes each week. It doesn't have to be done all at once. Each active period throughout the day adds up.  Just Move!  You don't have to join a gym or own pricey sports equipment. Just get out and walk. Walking is an aerobic exercise that makes your heart and lungs work hard. It helps your heart and blood vessels. Walking requires only a sturdy pair of sneakers and your own two feet. The more you walk, the easier it gets.   Schedule time every day to move your feet.   Make it part of your daily routine.   Walk with a friend or a group to keep it interesting and fun.   Try taking several short walks during the day to meet your daily activity goal.    Adding Resistance Exercise  Resistance exercise (also called strength training), makes muscles stronger. It also helps muscles use insulin better. Ask your health care provider whether this type of exercise is right for you. If it is, your health care provider can help you work it in to your activity plan.  Staying Safe  Being active may cause blood sugar to drop faster than usual. This is especially true if  you take medication to manage your blood sugar. But there are things you can do to help reduce the risk of accidental lows. Keep these tips in mind:   Always carry identification when you exercise outside your home. Carry a cell phone to use in case of emergency.   If you can, include friends and family in your activities.   Wear a medical ID bracelet that says you have diabetes.   Use the right safety equipment for the activity you do (such as a bicycle helmet when you ride a bicycle outdoors). Wear closed-toed shoes that fit your feet well.   Drink plenty of water before and during activity.   Keep a fast-acting sugar (such as glucose tablets) on hand in case  of low blood sugar.   Dress properly for the weather. Wear a hat if it's sunny, or wait until evening if it's too hot.   Avoid being active for long periods in very hot or very cold weather.   Skip activity if you're sick.       [address] [employer] [address] [place], [place] [address]. All rights reserved. This information is not intended as a substitute for professional medical care. Always follow your healthcare professional's instructions.

## 2015-04-17 NOTE — Progress Notes (Signed)
IV out; catheter intact; skin dry, intact, clean; discharge instructions and prescriptions were given to pt; car voucher given to pt and white cab called; pt got her stuff back from safe; pt seen by MD before discharge and questions were answered; pt left in stable condition

## 2015-04-17 NOTE — Discharge Instr - Diet (Signed)
Diabetic diet

## 2015-04-17 NOTE — Plan of Care (Addendum)
Problem: Safety  Goal: Patient will be free from injury during hospitalization  Outcome: Progressing  Bed in low position; three side rails up; call bell and phone within reach; non skid socks on; pt's independent after set up; ambulatory in room; pt's encouraged to call for help when in need    P: continue hourly rounding    Problem: Pain  Goal: Patient's pain/discomfort is manageable  Outcome: Progressing  Complained of continuous pain in buttocks and groin due to rash, recurrent yeast infection; prn morphine given as ordered; pt's satisfied with the current pain regimen    P: continue to assess pain    Problem: Tissue integrity  Goal: Nutritional Status Improving  Outcome: Progressing  Good appetite; tolerating PO intake well; voiding well    Pt's BS elevated to 332; per technician, pt was on the phone yelling at someone and looked very upset and refused to get blood sugar check at that moment, so technician left the room saying that she would be back; when technician was back and pt calmed down a bit and was ready for blood sugar check, she already ate dinner; therefore, BS was taken after pt ate her dinner; scheduled 3unit of Novolog plus 4unit sliding scale given to pt; 30 minutes later, BS retaken and it was 355; MD notified; pt said she had one cup of ice cream before the sugar check; pt seen by MD; pt has pencil insulin taken at home with her; after talking to pt, MD cleared her for discharge
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
## 2015-08-04 ENCOUNTER — Emergency Department: Payer: Medicare Other

## 2015-08-04 ENCOUNTER — Emergency Department
Admission: EM | Admit: 2015-08-04 | Discharge: 2015-08-04 | Disposition: A | Discharge status: Home / Self Care | Payer: Medicare Other | Attending: Emergency Medical Services | Admitting: Emergency Medical Services

## 2015-08-04 DIAGNOSIS — E1165 Type 2 diabetes mellitus with hyperglycemia: Secondary | ICD-10-CM | POA: Insufficient documentation

## 2015-08-04 DIAGNOSIS — E1136 Type 2 diabetes mellitus with diabetic cataract: Secondary | ICD-10-CM | POA: Insufficient documentation

## 2015-08-04 DIAGNOSIS — H538 Other visual disturbances: Secondary | ICD-10-CM | POA: Insufficient documentation

## 2015-08-04 DIAGNOSIS — E7800 Pure hypercholesterolemia, unspecified: Secondary | ICD-10-CM | POA: Insufficient documentation

## 2015-08-04 DIAGNOSIS — J45909 Unspecified asthma, uncomplicated: Secondary | ICD-10-CM | POA: Insufficient documentation

## 2015-08-04 DIAGNOSIS — H269 Unspecified cataract: Secondary | ICD-10-CM

## 2015-08-04 DIAGNOSIS — R519 Headache, unspecified: Secondary | ICD-10-CM

## 2015-08-04 DIAGNOSIS — R739 Hyperglycemia, unspecified: Secondary | ICD-10-CM

## 2015-08-04 DIAGNOSIS — Z79899 Other long term (current) drug therapy: Secondary | ICD-10-CM | POA: Insufficient documentation

## 2015-08-04 DIAGNOSIS — Z7984 Long term (current) use of oral hypoglycemic drugs: Secondary | ICD-10-CM | POA: Insufficient documentation

## 2015-08-04 DIAGNOSIS — Z794 Long term (current) use of insulin: Secondary | ICD-10-CM | POA: Insufficient documentation

## 2015-08-04 DIAGNOSIS — R51 Headache: Secondary | ICD-10-CM | POA: Insufficient documentation

## 2015-08-04 LAB — GFR: EGFR: >60

## 2015-08-04 LAB — URINALYSIS, REFLEX TO MICROSCOPIC EXAM IF INDICATED
Bilirubin, UA: NEGATIVE
Blood, UA: NEGATIVE
Glucose, UA: >=500 — AB
Ketones UA: NEGATIVE
Leukocyte Esterase, UA: NEGATIVE
Nitrite, UA: NEGATIVE
Protein, UR: NEGATIVE
Specific Gravity UA: >1.035 — AB (ref 1.001–1.035)
Urine pH: 6 (ref 5.0–8.0)
Urobilinogen, UA: NEGATIVE mg/dL

## 2015-08-04 LAB — COMPREHENSIVE METABOLIC PANEL
ALT: 11 U/L (ref 0–55)
AST (SGOT): 11 U/L (ref 5–34)
Albumin/Globulin Ratio: 0.9 (ref 0.9–2.2)
Albumin: 3.5 g/dL (ref 3.5–5.0)
Alkaline Phosphatase: 104 U/L (ref 37–106)
Anion Gap: 14 (ref 5.0–15.0)
BUN: 13 mg/dL (ref 7–19)
Bilirubin, Total: 0.2 mg/dL (ref 0.2–1.2)
CO2: 20 mEq/L — ABNORMAL LOW (ref 22–29)
Calcium: 9.7 mg/dL (ref 8.5–10.5)
Chloride: 99 mEq/L — ABNORMAL LOW (ref 100–111)
Creatinine: 0.8 mg/dL (ref 0.6–1.0)
Globulin: 3.8 g/dL — ABNORMAL HIGH (ref 2.0–3.6)
Glucose: 417 mg/dL — ABNORMAL HIGH (ref 70–100)
Potassium: 3.7 mEq/L (ref 3.5–5.1)
Protein, Total: 7.3 g/dL (ref 6.0–8.3)
Sodium: 133 mEq/L — ABNORMAL LOW (ref 136–145)

## 2015-08-04 LAB — CBC AND DIFFERENTIAL
Basophils Absolute Automated: 0.03 10*3/uL (ref 0.00–0.20)
Basophils Automated: 0 %
Eosinophils Absolute Automated: 0.18 10*3/uL (ref 0.00–0.70)
Eosinophils Automated: 2 %
Hematocrit: 36.9 % — ABNORMAL LOW (ref 37.0–47.0)
Hgb: 12.9 g/dL (ref 12.0–16.0)
Immature Granulocytes Absolute: 0.01 10*3/uL
Immature Granulocytes: 0 %
Lymphocytes Absolute Automated: 2.18 10*3/uL (ref 0.50–4.40)
Lymphocytes Automated: 28 %
MCH: 26.4 pg — ABNORMAL LOW (ref 28.0–32.0)
MCHC: 35 g/dL (ref 32.0–36.0)
MCV: 75.5 fL — ABNORMAL LOW (ref 80.0–100.0)
MPV: 10.2 fL (ref 9.4–12.3)
Monocytes Absolute Automated: 0.5 10*3/uL (ref 0.00–1.20)
Monocytes: 6 %
Neutrophils Absolute: 4.98 10*3/uL (ref 1.80–8.10)
Neutrophils: 63 %
Nucleated RBC: 0 /100 WBC (ref 0–1)
Platelets: 344 10*3/uL (ref 140–400)
RBC: 4.89 10*6/uL (ref 4.20–5.40)
RDW: 15 % (ref 12–15)
WBC: 7.87 10*3/uL (ref 3.50–10.80)

## 2015-08-04 LAB — GLUCOSE WHOLE BLOOD - POCT: Whole Blood Glucose POCT: 445 mg/dL — ABNORMAL HIGH (ref 70–100)

## 2015-08-04 LAB — PT/INR
PT INR: 1 (ref 0.9–1.1)
PT: 12.8 s (ref 12.6–15.0)

## 2015-08-04 LAB — TROPONIN I: Troponin I: <0.01 ng/mL (ref 0.00–0.09)

## 2015-08-04 LAB — WHOLE BLOOD GLUCOSE POCT: Whole Blood Glucose POCT: 296 mg/dL — ABNORMAL HIGH (ref 70–100)

## 2015-08-04 LAB — APTT: PTT: 28 s (ref 23–37)

## 2015-08-04 MED ORDER — KETOROLAC TROMETHAMINE 30 MG/ML IJ SOLN
30.0000 mg | Freq: Once | INTRAMUSCULAR | Status: AC
Start: 2015-08-04 — End: 2015-08-04
  Administered 2015-08-04: 30 mg via INTRAVENOUS
  Filled 2015-08-04: qty 1

## 2015-08-04 MED ORDER — METOCLOPRAMIDE HCL 5 MG/ML IJ SOLN
10.0000 mg | Freq: Once | INTRAMUSCULAR | Status: AC
Start: 2015-08-04 — End: 2015-08-04
  Administered 2015-08-04: 19:00:00 10 mg via INTRAVENOUS
  Filled 2015-08-04: qty 2

## 2015-08-04 MED ORDER — MAGNESIUM SULFATE IN D5W 10-5 MG/ML-% IV SOLN
1.0000 g | Freq: Once | INTRAVENOUS | Status: AC
Start: 2015-08-04 — End: 2015-08-04
  Administered 2015-08-04: 1 g via INTRAVENOUS
  Filled 2015-08-04: qty 100

## 2015-08-04 MED ORDER — IODIXANOL 320 MG/ML IV SOLN
INTRAVENOUS | Status: AC
Start: 2015-08-04 — End: 2015-08-04
  Administered 2015-08-04: 50 mL
  Filled 2015-08-04: qty 100

## 2015-08-04 MED ORDER — INSULIN REGULAR HUMAN 100 UNIT/ML IJ SOLN
10.0000 [IU] | Freq: Once | INTRAMUSCULAR | Status: AC
Start: 2015-08-04 — End: 2015-08-04
  Administered 2015-08-04: 10 [IU] via INTRAVENOUS
  Filled 2015-08-04: qty 30

## 2015-08-04 MED ORDER — IODIXANOL 320 MG/ML IV SOLN
INTRAVENOUS | Status: AC
Start: 2015-08-04 — End: 2015-08-04
  Administered 2015-08-04: 100 mL
  Filled 2015-08-04: qty 50

## 2015-08-04 MED ORDER — ONDANSETRON 4 MG PO TBDP
4.0000 mg | ORAL_TABLET | Freq: Once | ORAL | Status: AC
Start: 2015-08-04 — End: 2015-08-04
  Administered 2015-08-04: 4 mg via ORAL
  Filled 2015-08-04: qty 1

## 2015-08-04 MED ORDER — HYDROCODONE-ACETAMINOPHEN 5-325 MG PO TABS
1.0000 | ORAL_TABLET | Freq: Once | ORAL | Status: AC
Start: 2015-08-04 — End: 2015-08-04
  Administered 2015-08-04: 1 via ORAL
  Filled 2015-08-04: qty 1

## 2015-08-04 MED ORDER — TETRACAINE HCL 0.5 % OP SOLN
2.0000 [drp] | Freq: Once | OPHTHALMIC | Status: AC
Start: 2015-08-04 — End: 2015-08-04
  Administered 2015-08-04: 2 [drp] via OPHTHALMIC
  Filled 2015-08-04: qty 2

## 2015-08-04 NOTE — ED Notes (Signed)
Patient presents to the ED via ambulance for blurred vision. Per EMS, patient approached them while at a grocery store and stated that she was having trouble with her vision. Patient +nausea, +HA, and +abdominal pain. Patient denies any chest pain. Per EMS dexi 125.

## 2015-08-04 NOTE — ED Notes (Signed)
Visual acuity test attempted, pt reports not being able to see anything, however able to ambulate independently with no noted deficits, pt also witnessed texting on her phone and watching television.  Difficult to assess actual acuity.

## 2015-08-04 NOTE — ED Notes (Signed)
Bed: E10  Expected date:   Expected time:   Means of arrival:   Comments:  Medic

## 2015-08-04 NOTE — Consults (Signed)
NEUROLOGY CONSULTATION    Date Time: 08/04/2015 6:13 PM  Patient Name: WRUEAVW,UJWJXBJY Y  Attending Physician: He, Zadie Rhine, MD PHD      Assessment & Plan:   Sudden onset of blurred vision and diplopia about 30 minutes prior to arrival in the ED.  Etiology is unclear .  Given inconsistent responses to questions, and inconsistencies on examination.     Given lack of clarity about actual diagnosis, we will proceed with CT angiogram of the head and neck, and that is negative, will get a stat MRI diffusion weighted imaging to make sure that the patient is not having any ischemia.  If the scans are negative, then I would consider ophthalmology Evaluation   Given symptoms of headache, headache related to visual disturbances in the differential diagnosis as well.   Aspirin on empiric basis for now   Will follow-up    History of Present Illness:   Patient is a 39 year old lady, comes to the hospital because of blurred vision.  She initially mentioned to the ED that she was having double vision, in her left eye.  When I examined the patient.  She said that she could not see anything, she could not even see light, or fingers or  my hand movements.  But when I examined her pupils, she said that the light was too bright.  Other times she says that she cannot see and then she said that she was able to count my fingers as well.    She is complaining of a mild to moderate headache as well, she reports no numbness stinging paresthesias in the upper or lower extremities.  She had no motor deficits    Past Medical History:     Past Medical History   Diagnosis Date   . Diabetes mellitus without complication    . Asthma without status asthmaticus    . Anemia    . Depression    . Anemia    . Panic attack    . Seasonal allergies    . High cholesterol        Meds:      Scheduled Meds: PRN Meds:      iodixanol      iodixanol          Continuous Infusions:           I personally reviewed all of the medications    Allergies   Allergen  Reactions   . Latex    . Percocet [Oxycodone-Acetaminophen]      Unknown reaction     . Tape        Social & Family History:     Social History     Social History   . Marital Status: Divorced     Spouse Name: N/A   . Number of Children: N/A   . Years of Education: N/A     Occupational History   . Not on file.     Social History Main Topics   . Smoking status: Never Smoker    . Smokeless tobacco: Not on file   . Alcohol Use: No   . Drug Use: No   . Sexual Activity: Not on file     Other Topics Concern   . Not on file     Social History Narrative     Family History   Problem Relation Age of Onset   . Diabetes Mother    . Diabetes Sister    . Diabetes Maternal Aunt    .  Hypertension Paternal Grandmother    . No known problems Daughter    . No known problems Son    . No known problems Daughter    . No known problems Son        Review of Systems:   Planes of headache, complains of blurred vision.  Denies neck pain, denies chest pain, denies nausea, denies vomiting, denies skin rash, denies any oncological issues, does complain of ankle pain    Physical Exam:   Blood pressure 151/85, pulse 93, temperature 98.7 F (37.1 C), temperature source Oral, resp. rate 20, height 1.803 m (5\' 11" ), weight 135.353 kg (298 lb 6.4 oz), last menstrual period 07/28/2015, SpO2 99 %.    HEENT: Normocephalic. Non-icter, no congestion, no carotid bruits  Optho: Given complaint of severely bright light, I was unable to visualize  Lungs:  CTA bil  Cardiac:  S1,S2, normal rate and rhythm  Neck: supple, no lymphadenopathy, no thyromegaly, no JVD, no cartoid bruits  Extremities: no clubbing, cyanosis, or edema  Skin: no rashes or lesions noted    Neuro:  Level of consciousness and alert, somewhat odd affect, follows simple commands, speech was fluent.  Smile was symmetric.  Able to move both arms and legs in equal fashion.  Visual acuity, could not be really checked, initially she said she could not see anything at all including light and at  other times, she stated she was able to count fingers, at other times she is complaining of double vision and then at other times she complained of blurred vision        Labs:         Invalid input(s): TBILI          No results for input(s): PTT, PT, INR in the last 72 hours.       Radiology Results (24 Hour)     Procedure Component Value Units Date/Time    CT Head WO Contrast [[id number]] Collected:  08/04/15 1719    Order Status:  Completed Updated:  08/04/15 1727    Narrative:       CT HEAD WO CONTRAST     Clinical History: HEADACHE, CHRONIC, NEW FEATURES OR NEURO DEFICIT.    Comparison: None. .    Technique: Axial CT is performed from the foramen magnum to the vertex  without intravenous contrast.    Note: Note that CT scanning at this site  utilizes multiple dose  reduction techniques including automatic exposure control, adjustment of  the MAS and/or KVP according to patient's size and use of iterative  reconstruction technique    Findings: The ventricles and cortical sulci are normal in size and  configuration. There is no acute intracranial hemorrhage. No mass or  mass effect is evident.  There is no midline shift.  No abnormal  intra-axial or extra-axial fluid collection is seen. Visualized portions  of the cranium, orbits, paranasal sinuses, and mastoids are  unremarkable.       Impression:       No evidence of acute intracranial abnormality.  Negative for  acute intracranial hemorrhage.    Orson Gear, MD   08/04/2015 5:23 PM             All recent brain and spine imaging (MRI, CT) personally reviewed.    Chart reviewed    Case discussed with: ED attending, patient,         Signed by: Cathe Mons, MD  Spectralink: [phone]      Answering  Service: [phone]

## 2015-08-04 NOTE — ED Notes (Signed)
Patient requested her phone and is currently texting. Dr. Francesco Sor notified while in unit.

## 2015-08-04 NOTE — Progress Notes (Signed)
[facility]  Stroke Team Evaluation     Code stroke called on:   Date: 08/04/2015  Time Code Called: 73    40 y.o. female presenting with blurred vision, headache    Last known well: 1630  Initial NIH Stroke Scale: 8    Prior stroke: no  Patient on anticoagulation: no  Pre-stroke mRS: 0  Left or right handed: right  List cardiovascular risk factors (htn, hld, dm, a-fib, tobacco, etc.): dm    Patient is not a candidate for thrombolytics due to:    Discussed with Dr. He, Dr. Derry Lory, RN.    Valinda Party, RN  08/04/2015 5:10 PM    ED Charge Nurse / [phone]  Stroke Coordinator / [phone]

## 2015-08-04 NOTE — Discharge Instructions (Signed)
Please follow up with PMD.   Please follow up with eye doctor (ophthalmologist).   Return if worsening symptoms.

## 2015-08-05 LAB — ECG 12-LEAD
Atrial Rate: 88 {beats}/min
P Axis: 53 degrees
P-R Interval: 148 ms
Q-T Interval: 378 ms
QRS Duration: 102 ms
QTC Calculation (Bezet): 457 ms
R Axis: 1 degrees
T Axis: 34 degrees
Ventricular Rate: 88 {beats}/min

## 2015-08-05 NOTE — ED Provider Notes (Signed)
Physician/Midlevel provider first contact with patient: 08/04/15 1654         EMERGENCY DEPARTMENT NOTE    Physician/Midlevel provider first contact with patient: 08/04/15 1654         HISTORY OF PRESENT ILLNESS   Historian:Patient  Translator Used: No    40 y.o. female h/o dm, here because she feel like her vision is blurry and she sees double. Onset about 30 min prior to presentation. No focal weakness. No n/v/d. No cp. No sob. Pt's history seems to change over time. Poor historian.     1. Location of symptoms: ent  2. Onset of symptoms: 30 min   3. What was patient doing when symptoms started (Context): nothing  4. Severity: moderate  5. Timing: constant  6. Activities that worsen symptoms: none  7. Activities that improve symptoms: none  8. Quality:   9. Radiation of symptoms:  10. Associated signs and Symptoms: see above  11. Are symptoms worsening? NO  MEDICAL HISTORY     Past Medical History:  Past Medical History   Diagnosis Date   . Diabetes mellitus without complication    . Asthma without status asthmaticus    . Anemia    . Depression    . Anemia    . Panic attack    . Seasonal allergies    . High cholesterol        Past Surgical History:  Past Surgical History   Procedure Laterality Date   . Cesarean section     . Cholecystectomy     . Tonsillectomy     . Hernia repair         Social History:  Social History     Social History   . Marital Status: Divorced     Spouse Name: N/A   . Number of Children: N/A   . Years of Education: N/A     Occupational History   . Not on file.     Social History Main Topics   . Smoking status: Never Smoker    . Smokeless tobacco: Not on file   . Alcohol Use: No   . Drug Use: No   . Sexual Activity: Not on file     Other Topics Concern   . Not on file     Social History Narrative       Family History:  Family History   Problem Relation Age of Onset   . Diabetes Mother    . Diabetes Sister    . Diabetes Maternal Aunt    . Hypertension Paternal Grandmother    . No known  problems Daughter    . No known problems Son    . No known problems Daughter    . No known problems Son        Outpatient Medication:  Discharge Medication List as of 08/04/2015 10:09 PM      CONTINUE these medications which have NOT CHANGED    Details   ATORVASTATIN CALCIUM PO Take by mouth., Until Discontinued, Historical Med      FREESTYLE TEST STRIPS test strip Check blood sugar twice a day., Print      GABAPENTIN PO Take by mouth., Until Discontinued, Historical Med      glucose monitoring kit (FREESTYLE) monitoring kit use as instructed, Print      ibuprofen (ADVIL,MOTRIN) 600 MG tablet Take 1 tablet (600 mg total) by mouth every 6 (six) hours as needed for Pain or Fever., Starting 03/15/2015, Until Discontinued, Print  insulin aspart (NOVOLOG) 100 UNIT/ML injection Inject 3 Units into the skin 3 (three) times daily before meals., Starting 04/17/2015, Until Discontinued, No Print      insulin glargine (LANTUS SOLOSTAR) 100 UNIT/ML injection pen Inject 45 Units into the skin daily., Starting 04/17/2015, Until Discontinued, Print      Lancets (FREESTYLE) lancets Use as instructed, Print      metFORMIN (GLUCOPHAGE) 500 MG tablet Take 2 tablets (1,000 mg total) by mouth 2 (two) times daily with meals., Starting 04/17/2015, Until Discontinued, Print      VITAMIN D, ERGOCALCIFEROL, PO Take by mouth., Until Discontinued, Historical Med           REVIEW OF SYSTEMS   ADD ROS  Review of Systems   Eyes: Positive for blurred vision.   All other systems reviewed and are negative.    PHYSICAL EXAM     Filed Vitals:    08/04/15 2239   BP: 152/87   Pulse: 82   Temp: 98.1 F (36.7 C)   Resp: 17   SpO2: 96%     Nursing note and vitals reviewed.  Constitutional:  Well developed, well nourished.   Head:  Atraumatic. Normocephalic.    Eyes:  PERRL. EOMI. Conjunctivae are not pale. [place]: 20/200 bilaterally; pressure: left 15; right 20  ENT:  Mucous membranes are moist and intact. Oropharynx is clear and symmetric.  Patent  airway.  Neck:  Supple. Full ROM.    Cardiovascular:  Regular rate. Regular rhythm. No murmurs, rubs, or gallops.  Respiratory:  No evidence of respiratory distress. Clear to auscultation bilaterally.  No wheezing, rales or rhonchi.   GI:  Soft and non-distended. There is no tenderness. No rebound, guarding, or rigidity.  Back:  Full ROM. Nontender.  MSK:  No edema. No cyanosis. No clubbing. Full range of motion in all extremities.  Skin:  Skin is warm and dry.  No diaphoresis. No rash.   Neurological:  Alert, awake, and appropriate. Normal speech. Motor normal.  Psychiatric:  Good eye contact. Normal interaction, affect, and behavior.        MEDICAL DECISION MAKING     DISCUSSION      R/o ich, r/o posterior arterial circulation, r/o electrolyte abn, r/o uti, pt has cataracts and seems able to see cell phone just fine.       tpa considered not indicated cause of blurry vision unlikely cva.      Vital Signs: Reviewed the patient?s vital signs.   Nursing Notes: Reviewed and utilized available nursing notes.  Medical Records Reviewed: Reviewed available past medical records.      PROCEDURES        CARDIAC STUDIES    The following cardiac studies were independently interpreted by the Emergency Medicine Physician.  For full cardiac study results please see chart.      EKG Interpretation:    19:00 NSR at 88 bpm, NAD, no LVH, qrs 102 (-) ST-T changes similar to ekg on 01/24/2015 as read by me.       IMAGING STUDIES    The following imaging studies were independently interpreted by the Emergency Medicine Physician.  For full imaging study results please see chart.    CT Head W Contrast   Final Result       1. No evidence of acute intracranial abnormality.   2. No high grade vascular narrowing or occlusion within the head and   neck.   3. No evidence of abnormal perfusion.     4.  Pulmonary nodule within the left upper lobe measuring 5 mm. Please   see follow-up recommendations below.      Fleischner Society 2017 Guidelines  for Management of Incidentally   Detected Pulmonary Nodules in Adults      I. Solid nodules      Solitary nodule size: <6 mm    *  low risk patients: no follow-up needed    *  high risk patients: optional CT at 12 months (e.g. suspicious nodule   morphology, upper lobe location, or both)      Solitary nodule size: 6-8 mm   *  low risk patients: follow-up CT at 6-12 months, then consider   further follow-up at 18-24 months    *  high risk patients: initial follow-up CT at 6-12 months and then at   18-24 months if no change      Solitary nodule size: >8 mm   *  either low or high risk patients:  consider follow-up CT at 3   months, and/or CT-PET, and/or biopsy      Note:    *  newly detected indeterminate nodule in persons 78 years of age or   older.   *  low risk patients: minimal or absent history of smoking and or   other known risk factors   *  high risk patients: history of smoking or of other known risk   factors (e.g. first degree relative with lung cancer, or exposure to   asbestos, radon, uranium)    *  multiple solid nodule recommendations assume no known or suspected   primary malignancy that could be a source of metastatic disease   *  suspicious nodule features include spiculation, lobulation and cystic   component.            Gustavus Messing, MD    08/04/2015 11:23 PM         CT Angiogram Head Neck   Final Result       1. No evidence of acute intracranial abnormality.   2. No high grade vascular narrowing or occlusion within the head and   neck.   3. No evidence of abnormal perfusion.     4. Pulmonary nodule within the left upper lobe measuring 5 mm. Please   see follow-up recommendations below.      Fleischner Society 2017 Guidelines for Management of Incidentally   Detected Pulmonary Nodules in Adults      I. Solid nodules      Solitary nodule size: <6 mm    *  low risk patients: no follow-up needed    *  high risk patients: optional CT at 12 months (e.g. suspicious nodule   morphology, upper lobe location,  or both)      Solitary nodule size: 6-8 mm   *  low risk patients: follow-up CT at 6-12 months, then consider   further follow-up at 18-24 months    *  high risk patients: initial follow-up CT at 6-12 months and then at   18-24 months if no change      Solitary nodule size: >8 mm   *  either low or high risk patients:  consider follow-up CT at 3   months, and/or CT-PET, and/or biopsy      Note:    *  newly detected indeterminate nodule in persons 42 years of age or   older.   *  low risk patients: minimal or absent history of smoking and or  other known risk factors   *  high risk patients: history of smoking or of other known risk   factors (e.g. first degree relative with lung cancer, or exposure to   asbestos, radon, uranium)    *  multiple solid nodule recommendations assume no known or suspected   primary malignancy that could be a source of metastatic disease   *  suspicious nodule features include spiculation, lobulation and cystic   component.      Gustavus Messing, MD    08/04/2015 11:23 PM         CT Angiogram Cerebral Perfusion W 3D Reconstruction   Final Result       1. No evidence of acute intracranial abnormality.   2. No high grade vascular narrowing or occlusion within the head and   neck.   3. No evidence of abnormal perfusion.     4. Pulmonary nodule within the left upper lobe measuring 5 mm. Please   see follow-up recommendations below.      Fleischner Society 2017 Guidelines for Management of Incidentally   Detected Pulmonary Nodules in Adults      I. Solid nodules      Solitary nodule size: <6 mm    *  low risk patients: no follow-up needed    *  high risk patients: optional CT at 12 months (e.g. suspicious nodule   morphology, upper lobe location, or both)      Solitary nodule size: 6-8 mm   *  low risk patients: follow-up CT at 6-12 months, then consider   further follow-up at 18-24 months    *  high risk patients: initial follow-up CT at 6-12 months and then at   18-24 months if no change       Solitary nodule size: >8 mm   *  either low or high risk patients:  consider follow-up CT at 3   months, and/or CT-PET, and/or biopsy      Note:    *  newly detected indeterminate nodule in persons 14 years of age or   older.   *  low risk patients: minimal or absent history of smoking and or   other known risk factors   *  high risk patients: history of smoking or of other known risk   factors (e.g. first degree relative with lung cancer, or exposure to   asbestos, radon, uranium)    *  multiple solid nodule recommendations assume no known or suspected   primary malignancy that could be a source of metastatic disease   *  suspicious nodule features include spiculation, lobulation and cystic   component.      Gustavus Messing, MD    08/04/2015 11:23 PM         Chest AP Portable   Final Result    No acute cardiopulmonary findings.      Orson Gear, MD    08/04/2015 8:09 PM         MRI Brain WO Contrast   Final Result    No evidence of acute ischemia.      Gustavus Messing, MD    08/04/2015 6:59 PM         CT Head WO Contrast   Final Result    No evidence of acute intracranial abnormality.  Negative for   acute intracranial hemorrhage.      Orson Gear, MD    08/04/2015 5:23 PM  PULSE OXIMETRY    Oxygen Saturation by Pulse Oximetry: 96%  Interventions: none  Interpretation: normal    EMERGENCY DEPT. MEDICATIONS      ED Medication Orders     Start Ordered     Status Ordering Provider    08/04/15 2201 08/04/15 2201  HYDROcodone-acetaminophen (NORCO) 5-325 MG per tablet 1 tablet   Once     Route: Oral  Ordered Dose: 1 tablet     Last MAR action:  Given Selma Rodelo SHU    08/04/15 2201 08/04/15 2201  ondansetron (ZOFRAN-ODT) disintegrating tablet 4 mg   Once     Route: Oral  Ordered Dose: 4 mg     Last MAR action:  Given Joannah Gitlin SHU    08/04/15 2104 08/04/15 2103  tetracaine (PONTOCAINE) 0.5 % ophthalmic solution 2 drop   Once     Route: Both Eyes  Ordered Dose: 2 drop     Last MAR action:  Given Marnie Fazzino  SHU    08/04/15 1902 08/04/15 1901  ketorolac (TORADOL) injection 30 mg   Once     Route: Intravenous  Ordered Dose: 30 mg     Last MAR action:  Given Alianah Lofton SHU    08/04/15 1902 08/04/15 1901  metoclopramide (REGLAN) injection 10 mg   Once     Route: Intravenous  Ordered Dose: 10 mg     Last MAR action:  Given Oscar Hank SHU    08/04/15 1902 08/04/15 1901  magnesium sulfate 1g in dextrose 5% IVPB (premix)   Once     Route: Intravenous  Ordered Dose: 1 g     Last MAR action:  Stopped Marybell Robards SHU    08/04/15 1902 08/04/15 1901  insulin regular (HumuLIN R,NovoLIN R) injection 10 Units   Once     Route: Intravenous  Ordered Dose: 10 Units     Last MAR action:  Given Vahan Wadsworth SHU    08/04/15 1735 08/04/15 1735  iodixanol (VISIPAQUE) 320 MG/ML injection     Comments:  Created by cabinet override    Last MAR action:  Imaging Agent Given     08/04/15 1735 08/04/15 1735  iodixanol (VISIPAQUE) 320 MG/ML injection     Comments:  Created by cabinet override    Last MAR action:  Imaging Agent Given           LABORATORY RESULTS    Ordered and independently interpreted AVAILABLE laboratory tests. Please see results section in chart for full details.  Results for orders placed or performed during the hospital encounter of 08/04/15   CBC with differential   Result Value Ref Range    WBC 7.87 3.50 - 10.80 x10 3/uL    Hgb 12.9 12.0 - 16.0 g/dL    Hematocrit 74.2 (L) 37.0 - 47.0 %    Platelets 344 140 - 400 x10 3/uL    RBC 4.89 4.20 - 5.40 x10 6/uL    MCV 75.5 (L) 80.0 - 100.0 fL    MCH 26.4 (L) 28.0 - 32.0 pg    MCHC 35.0 32.0 - 36.0 g/dL    RDW 15 12 - 15 %    MPV 10.2 9.4 - 12.3 fL    Neutrophils 63 None %    Lymphocytes Automated 28 None %    Monocytes 6 None %    Eosinophils Automated 2 None %    Basophils Automated 0 None %    Immature Granulocyte 0 None %  Nucleated RBC 0 0 - 1 /100 WBC    Neutrophils Absolute 4.98 1.80 - 8.10 x10 3/uL    Abs Lymph Automated 2.18 0.50 - 4.40 x10 3/uL    Abs Mono Automated 0.50  0.00 - 1.20 x10 3/uL    Abs Eos Automated 0.18 0.00 - 0.70 x10 3/uL    Absolute Baso Automated 0.03 0.00 - 0.20 x10 3/uL    Absolute Immature Granulocyte 0.01 0 x10 3/uL   Protime-INR   Result Value Ref Range    PT 12.8 12.6 - 15.0 sec    PT INR 1.0 0.9 - 1.1    PT Anticoag. Given Within 48 hrs. None    APTT   Result Value Ref Range    PTT 28 23 - 37 sec   UA, Reflex to Microscopic   Result Value Ref Range    Urine Type Random     Color, UA Straw Clear - Yellow    Clarity, UA Clear Clear - Hazy    Specific Gravity UA >1.035 (A) 1.001-1.035    Urine pH 6.0 5.0-8.0    Leukocyte Esterase, UA Negative Negative    Nitrite, UA Negative Negative    Protein, UR Negative Negative    Glucose, UA >=500 (A) Negative    Ketones UA Negative Negative    Urobilinogen, UA Negative 0.2  -  2.0 mg/dL    Bilirubin, UA Negative Negative    Blood, UA Negative Negative   Comprehensive metabolic panel   Result Value Ref Range    Glucose 417 (H) 70 - 100 mg/dL    BUN 13 7 - 19 mg/dL    Creatinine 0.8 0.6 - 1.0 mg/dL    Sodium 161 (L) 096 - 145 mEq/L    Potassium 3.7 3.5 - 5.1 mEq/L    Chloride 99 (L) 100 - 111 mEq/L    CO2 20 (L) 22 - 29 mEq/L    Calcium 9.7 8.5 - 10.5 mg/dL    Protein, Total 7.3 6.0 - 8.3 g/dL    Albumin 3.5 3.5 - 5.0 g/dL    AST (SGOT) 11 5 - 34 U/L    ALT 11 0 - 55 U/L    Alkaline Phosphatase 104 37 - 106 U/L    Bilirubin, Total 0.2 0.2 - 1.2 mg/dL    Globulin 3.8 (H) 2.0 - 3.6 g/dL    Albumin/Globulin Ratio 0.9 0.9 - 2.2    Anion Gap 14.0 5.0 - 15.0   Troponin I   Result Value Ref Range    Troponin I <0.01 0.00 - 0.09 ng/mL   GFR   Result Value Ref Range    EGFR >60.0    Glucose Whole Blood - POCT   Result Value Ref Range    POCT - Glucose Whole blood 445 (H) 70 - 100 mg/dL   Glucose Whole Blood - POCT   Result Value Ref Range    POCT - Glucose Whole blood 296 (H) 70 - 100 mg/dL   ECG 12 lead   Result Value Ref Range    Ventricular Rate 88 BPM    Atrial Rate 88 BPM    P-R Interval 148 ms    QRS Duration 102 ms    Q-T  Interval 378 ms    QTC Calculation (Bezet) 457 ms    P Axis 53 degrees    R Axis 1 degrees    T Axis 34 degrees       CONSULTATIONS and ED Course      .  Patient feels better. I have discussed all testing results and plan of care with patient. Patient agrees with going home and following up and return if worsening symptoms. Side effects of medications such as dizziness associated with opioids and muscle relaxants, nausea/vomiting and constipation associated with opioids and potential complications of antibiotics i.e. c. diff, yeast infection, rash, allergic reaction discussed.     ATTESTATIONS      Physician Attestation: I, Dr. Zadie Rhine Eshika Reckart, MD PhD , have been the primary provider for Dorothy [name] during this Emergency Dept visit and have reviewed the chart documented for accuracy and agree with its content.       DIAGNOSIS      Diagnosis:  Final diagnoses:   Blurry vision, bilateral   Nonintractable headache, unspecified chronicity pattern, unspecified headache type   Hyperglycemia   Cataract       Disposition:  ED Disposition     Discharge Dorothy [name] discharge to home/self care.    Condition at disposition: Stable            Prescriptions:  Discharge Medication List as of 08/04/2015 10:09 PM      CONTINUE these medications which have NOT CHANGED    Details   ATORVASTATIN CALCIUM PO Take by mouth., Until Discontinued, Historical Med      FREESTYLE TEST STRIPS test strip Check blood sugar twice a day., Print      GABAPENTIN PO Take by mouth., Until Discontinued, Historical Med      glucose monitoring kit (FREESTYLE) monitoring kit use as instructed, Print      ibuprofen (ADVIL,MOTRIN) 600 MG tablet Take 1 tablet (600 mg total) by mouth every 6 (six) hours as needed for Pain or Fever., Starting 03/15/2015, Until Discontinued, Print      insulin aspart (NOVOLOG) 100 UNIT/ML injection Inject 3 Units into the skin 3 (three) times daily before meals., Starting 04/17/2015, Until Discontinued, No Print       insulin glargine (LANTUS SOLOSTAR) 100 UNIT/ML injection pen Inject 45 Units into the skin daily., Starting 04/17/2015, Until Discontinued, Print      Lancets (FREESTYLE) lancets Use as instructed, Print      metFORMIN (GLUCOPHAGE) 500 MG tablet Take 2 tablets (1,000 mg total) by mouth 2 (two) times daily with meals., Starting 04/17/2015, Until Discontinued, Print      VITAMIN D, ERGOCALCIFEROL, PO Take by mouth., Until Discontinued, Historical Med                 Lanorris Kalisz, Zadie Rhine, MD [facility]  08/07/15 1428

## 2015-08-09 ENCOUNTER — Emergency Department: Payer: Medicare Other

## 2015-08-09 ENCOUNTER — Emergency Department
Admission: EM | Admit: 2015-08-09 | Discharge: 2015-08-09 | Disposition: A | Discharge status: Home / Self Care | Payer: Medicare Other | Attending: Emergency Medicine | Admitting: Emergency Medicine

## 2015-08-09 DIAGNOSIS — Z79899 Other long term (current) drug therapy: Secondary | ICD-10-CM | POA: Insufficient documentation

## 2015-08-09 DIAGNOSIS — J45909 Unspecified asthma, uncomplicated: Secondary | ICD-10-CM | POA: Insufficient documentation

## 2015-08-09 DIAGNOSIS — Z794 Long term (current) use of insulin: Secondary | ICD-10-CM | POA: Insufficient documentation

## 2015-08-09 DIAGNOSIS — E1165 Type 2 diabetes mellitus with hyperglycemia: Secondary | ICD-10-CM | POA: Insufficient documentation

## 2015-08-09 DIAGNOSIS — R739 Hyperglycemia, unspecified: Secondary | ICD-10-CM

## 2015-08-09 DIAGNOSIS — E7800 Pure hypercholesterolemia, unspecified: Secondary | ICD-10-CM | POA: Insufficient documentation

## 2015-08-09 LAB — URINALYSIS, REFLEX TO MICROSCOPIC EXAM IF INDICATED
Bilirubin, UA: NEGATIVE
Glucose, UA: >=500 — AB
Ketones UA: NEGATIVE
Leukocyte Esterase, UA: NEGATIVE
Nitrite, UA: NEGATIVE
Protein, UR: 30 — AB
Specific Gravity UA: 1.026 (ref 1.001–1.035)
Urine pH: 7 (ref 5.0–8.0)
Urobilinogen, UA: NEGATIVE mg/dL

## 2015-08-09 LAB — BASIC METABOLIC PANEL
Anion Gap: 15 (ref 5.0–15.0)
BUN: 10 mg/dL (ref 7–19)
CO2: 19 mEq/L — ABNORMAL LOW (ref 22–29)
Calcium: 9.2 mg/dL (ref 8.5–10.5)
Chloride: 95 mEq/L — ABNORMAL LOW (ref 100–111)
Creatinine: 0.9 mg/dL (ref 0.6–1.0)
Glucose: 607 mg/dL (ref 70–100)
Potassium: 4.2 mEq/L (ref 3.5–5.1)
Sodium: 129 mEq/L — ABNORMAL LOW (ref 136–145)

## 2015-08-09 LAB — HEPATIC FUNCTION PANEL
ALT: 12 U/L (ref 0–55)
AST (SGOT): 14 U/L (ref 5–34)
Albumin/Globulin Ratio: 0.9 (ref 0.9–2.2)
Albumin: 3.3 g/dL — ABNORMAL LOW (ref 3.5–5.0)
Alkaline Phosphatase: 102 U/L (ref 37–106)
Bilirubin Direct: <0.1 mg/dL (ref 0.0–0.5)
Bilirubin, Total: 0.2 mg/dL (ref 0.2–1.2)
Globulin: 3.8 g/dL — ABNORMAL HIGH (ref 2.0–3.6)
Protein, Total: 7.1 g/dL (ref 6.0–8.3)

## 2015-08-09 LAB — CBC AND DIFFERENTIAL
Basophils Absolute Automated: 0.03 10*3/uL (ref 0.00–0.20)
Basophils Automated: 0 %
Eosinophils Absolute Automated: 0.19 10*3/uL (ref 0.00–0.70)
Eosinophils Automated: 2 %
Hematocrit: 33 % — ABNORMAL LOW (ref 37.0–47.0)
Hgb: 11.8 g/dL — ABNORMAL LOW (ref 12.0–16.0)
Immature Granulocytes Absolute: 0.01 10*3/uL
Immature Granulocytes: 0 %
Lymphocytes Absolute Automated: 2.5 10*3/uL (ref 0.50–4.40)
Lymphocytes Automated: 33 %
MCH: 27.1 pg — ABNORMAL LOW (ref 28.0–32.0)
MCHC: 35.8 g/dL (ref 32.0–36.0)
MCV: 75.7 fL — ABNORMAL LOW (ref 80.0–100.0)
MPV: 10.1 fL (ref 9.4–12.3)
Monocytes Absolute Automated: 0.41 10*3/uL (ref 0.00–1.20)
Monocytes: 5 %
Neutrophils Absolute: 4.53 10*3/uL (ref 1.80–8.10)
Neutrophils: 59 %
Nucleated RBC: 0 /100 WBC (ref 0–1)
Platelets: 318 10*3/uL (ref 140–400)
RBC: 4.36 10*6/uL (ref 4.20–5.40)
RDW: 15 % (ref 12–15)
WBC: 7.66 10*3/uL (ref 3.50–10.80)

## 2015-08-09 LAB — TROPONIN I: Troponin I: <0.01 ng/mL (ref 0.00–0.09)

## 2015-08-09 LAB — GLUCOSE WHOLE BLOOD - POCT
Whole Blood Glucose POCT: 582 mg/dL (ref 70–100)
Whole Blood Glucose POCT: 410 mg/dL — ABNORMAL HIGH (ref 70–100)
Whole Blood Glucose POCT: 377 mg/dL — ABNORMAL HIGH (ref 70–100)

## 2015-08-09 LAB — LIPASE: Lipase: 26 U/L (ref 8–78)

## 2015-08-09 LAB — GFR: EGFR: >60

## 2015-08-09 LAB — URINE HCG QUALITATIVE: Urine HCG Qualitative: NEGATIVE

## 2015-08-09 MED ORDER — SODIUM CHLORIDE 0.9 % IV BOLUS
1000.0000 mL | Freq: Once | INTRAVENOUS | Status: AC
Start: 2015-08-09 — End: 2015-08-09
  Administered 2015-08-09: 1000 mL via INTRAVENOUS

## 2015-08-09 MED ORDER — INSULIN REGULAR HUMAN 100 UNIT/ML IJ SOLN
5.0000 [IU] | Freq: Once | INTRAMUSCULAR | Status: AC
Start: 2015-08-09 — End: 2015-08-09
  Administered 2015-08-09: 5 [IU] via INTRAVENOUS
  Filled 2015-08-09: qty 15

## 2015-08-09 MED ORDER — INSULIN GLARGINE 100 UNIT/ML SC SOLN
45.0000 [IU] | Freq: Every evening | SUBCUTANEOUS | Status: DC
Start: 2015-08-09 — End: 2015-09-02

## 2015-08-09 MED ORDER — INSULIN GLARGINE 100 UNIT/ML SC SOLN
25.0000 [IU] | Freq: Once | SUBCUTANEOUS | Status: AC
Start: 2015-08-09 — End: 2015-08-09
  Administered 2015-08-09: 25 [IU] via SUBCUTANEOUS
  Filled 2015-08-09: qty 25

## 2015-08-09 MED ORDER — METFORMIN HCL 500 MG PO TABS
500.0000 mg | ORAL_TABLET | Freq: Two times a day (BID) | ORAL | Status: DC
Start: 2015-08-09 — End: 2015-09-02

## 2015-08-09 NOTE — Discharge Instructions (Signed)
Medical Noncompliance    You are getting this information because your caregivers are worried. They are afraid that you are not following the instructions given to you by your doctors.    This is called medical noncompliance. It is also called non-adherence. This is when a patient does not follow the instructions given to them by their doctors or nurses. This can mean that the patient is not going to doctor's appointments as scheduled. It can also mean that they are not taking their medications as prescribed. This can be very dangerous.     Common signs of noncompliance are:   Not filling a prescription at the pharmacy.   Not taking medications.   Taking different amounts of medications than prescribed.   Not following up with the doctor.   Not scheduling appointments as planned.    Noncompliance can be very dangerous. Medical problems that can be managed and cared for may get out of control.    Some causes for noncompliance are:   Not understanding complicated directions.   Not enough money (or insurance) to pay for medical treatment.   No transportation to appointments.   Problems with child care.   Having a bad experience with side-effects from prescribed medications.   Fear or concern for possible side-effects of a prescribed medication.   Fear of a specific medical condition. This can cause a person to ignore symptoms and/or treatments.   Disagreements with a doctor.   Depression.   Other psychiatric problems.   Problems with memory and thinking (early dementia).    Whatever the reason is, contact your doctor. Talk about why you are having problems keeping appointments and following medical instructions. It may save your life!    YOU SHOULD SEEK MEDICAL ATTENTION IMMEDIATELY, EITHER HERE OR AT THE NEAREST EMERGENCY DEPARTMENT, IF ANY OF THE FOLLOWING HAPPENS:     You feel that your medical problems are getting out of control.   You have any other symptoms or medical concerns.    If  you can't follow up with your doctor, or if at any time you feel you need to be rechecked or seen again, come back here or go to the nearest emergency department.               Hyperglycemia    During the visit today, your blood sugar was found to be high.    The medical term for high blood sugar is hyperglycemia. This may be a one-time event, but it could mean you have diabetes. Untreated diabetes can lead to heart problems and kidney problems (including kidney failure). It can also lead to stroke and blindness. It is very important to follow up with your regular doctor to have your blood sugar re-checked.    Tell your regular doctor that your blood sugar was high today. Your doctor will want to recheck your blood. He or she may also want to order other lab tests. If the doctor finds that you have diabetes, you will need medicine and a special diet to control your blood sugar.    YOU SHOULD SEEK MEDICAL ATTENTION IMMEDIATELY, EITHER HERE OR AT THE NEAREST EMERGENCY DEPARTMENT, IF ANY OF THE FOLLOWING OCCURS:   Confusion or lethargy (very sleepy and hard to wake up).   Signs of dehydration. Signs include less urination (peeing), dry mouth, extreme fatigue (tiredness), lightheadedness or fainting.   Constant vomiting (throwing up).   Fever (temperature higher than 100.4F / 38C) or shaking chills.   Abdominal (belly)   pain or vomiting (throwing up).

## 2015-08-09 NOTE — ED Notes (Signed)
Pt BIBA from [facility]. C/o sudden weakness and moderate headache (5/10) since 0030 today. Headache is located at forehead. Pt did not lose consciousness or fall. Denies blurred vision or dizziness. DEXI upon arrival at [facility] was [phone].

## 2015-08-10 NOTE — ED Provider Notes (Signed)
EMERGENCY DEPARTMENT HISTORY AND PHYSICAL EXAM    Date Time: 08/10/2015 1:50 AM  Patient Name: Dorothy [name], 40 y.o., female  ED Provider: Sonda Primes, MD    History of Presenting Illness:     Chief Complaint: hyperglycemia  History obtained from: Patient.  Onset/Duration: 1 hour ago  Narrative/Additional Historical Findings:Dorothy [name] is a 40 y.o. female  who felt as if she might pass out when she was at [facility].  She follow her blood sugar was high, he has similar to call EMS for her.  Blood sugar was noted to be elevated.  She reports that she has been noncompliant with her insulin medications because she does not have access to them.  She reports it has been a few weeks since she has had her Lantus or her regular insulin.  She is also on metformin at home and also endorses that she has not been taking this medication.  She denies any chest pain, shortness of breath or palpitations.  She denies any abdominal pain.  She reports polyuria and polydipsia.    Nursing notes from this date of service were reviewed.    Past Medical History:     Past Medical History   Diagnosis Date   . Diabetes mellitus without complication    . Asthma without status asthmaticus    . Anemia    . Depression    . Anemia    . Panic attack    . Seasonal allergies    . High cholesterol        Past Surgical History:     Past Surgical History   Procedure Laterality Date   . Cesarean section     . Cholecystectomy     . Tonsillectomy     . Hernia repair         Family History:     Family History   Problem Relation Age of Onset   . Diabetes Mother    . Diabetes Sister    . Diabetes Maternal Aunt    . Hypertension Paternal Grandmother    . No known problems Daughter    . No known problems Son    . No known problems Daughter    . No known problems Son        Social History:     Social History     Social History   . Marital Status: Divorced     Spouse Name: N/A   . Number of Children: N/A   . Years of Education: N/A     Social  History Main Topics   . Smoking status: Never Smoker    . Smokeless tobacco: Not on file   . Alcohol Use: No   . Drug Use: No   . Sexual Activity: Not on file     Other Topics Concern   . Not on file     Social History Narrative       Allergies:     Allergies   Allergen Reactions   . Latex    . Percocet [Oxycodone-Acetaminophen]      Unknown reaction     . Tape        Medications:   No current facility-administered medications for this encounter.    Current outpatient prescriptions:   .  ATORVASTATIN CALCIUM PO, Take by mouth., Disp: , Rfl:   .  FREESTYLE TEST STRIPS test strip, Check blood sugar twice a day., Disp: 100 each, Rfl: 0  .  GABAPENTIN PO, Take by  mouth., Disp: , Rfl:   .  glucose monitoring kit (FREESTYLE) monitoring kit, use as instructed, Disp: 1 each, Rfl: 0  .  ibuprofen (ADVIL,MOTRIN) 600 MG tablet, Take 1 tablet (600 mg total) by mouth every 6 (six) hours as needed for Pain or Fever., Disp: 30 tablet, Rfl: 0  .  insulin aspart (NOVOLOG) 100 UNIT/ML injection, Inject 3 Units into the skin 3 (three) times daily before meals., Disp: 10 mL, Rfl: 0  .  insulin glargine (LANTUS) 100 UNIT/ML injection, Inject 45 Units into the skin nightly., Disp: 10 mL, Rfl: 0  .  Lancets (FREESTYLE) lancets, Use as instructed, Disp: 100 each, Rfl: 0  .  metFORMIN (GLUCOPHAGE) 500 MG tablet, Take 1 tablet (500 mg total) by mouth 2 (two) times daily with meals., Disp: 14 tablet, Rfl: 0  .  VITAMIN D, ERGOCALCIFEROL, PO, Take by mouth., Disp: , Rfl:     Review of Systems:   Constitutional: No fever or change in activity.  Eyes: No eye redness. No eye discharge.  ENT: No ear pain or sore throat  Cardiovascular: No cp or palpitations  Respiratory: No cough or shortness of breath.  GI: No vomiting or diarrhea.  Genitourinary: Normal urination frequency  Musculoskeletal: No extremity pain or decreased use  Skin: no rash or skin lesions.  Neurologic: Normal level of alertness    All other systems reviewed and are  negative    Physical Exam:   ED Triage Vitals   Enc Vitals Group      BP 08/09/15 0103 149/70 mmHg      Heart Rate 08/09/15 0103 86      Resp Rate 08/09/15 0103 18      Temp 08/09/15 0103 98.2 F (36.8 C)      Temp Source 08/09/15 0103 Oral      SpO2 08/09/15 0103 100 %      Weight 08/09/15 0103 141.2 kg      Height --       Head Cir --       Peak Flow --       Pain Score --       Pain Loc --       Pain Edu? --       Excl. in GC? --      Constitutional: Vital signs reviewed. Well hydrated, well perfused, and no increased work of breathing. Appearance: Morbidly obese female in no apparent distress  Head:  Normocephalic, atraumatic  Eyes: No conjunctival injection. No discharge. EOMI  ENT: Mucous membranes moist, No oral lesions.  Neck: Normal range of motion. Non-tender.  Respiratory/Chest: Clear to auscultation. No respiratory distress.   Cardiovascular: Regular rate and rhythm. No murmur.   Abdomen: Soft and non-tender. No masses or hepatosplenomegaly.  Genitourinary:  UpperExtremity: No edema or cyanosis.  Moving well.  LowerExtremity: No edema or cyanosis.  Moving well.  Neurological: No focal motor deficits by observation. Speech normal. Memory normal.  Skin: Warm and dry. No rash.  Lymphatic: No cervical lymphadenopathy.  Psychiatric: Normal affect. Normal concentration.    Labs:     Labs Reviewed   BASIC METABOLIC PANEL - Abnormal; Notable for the following:     Glucose 607 (*)     Sodium 129 (*)     Chloride 95 (*)     CO2 19 (*)     All other components within normal limits    Narrative:     Critical Glucose     result called to  [id number]    . Readback confirmed[place] by  [id number] on 08/09/2015 at 02:07   CBC AND DIFFERENTIAL - Abnormal; Notable for the following:     Hgb 11.8 (*)     Hematocrit 33.0 (*)     MCV 75.7 (*)     MCH 27.1 (*)     All other components within normal limits   URINALYSIS, REFLEX TO MICROSCOPIC EXAM IF INDICATED - Abnormal; Notable for the following:     Clarity, UA Cloudy (*)     Protein, UR  30 (*)     Glucose, UA >=500 (*)     Blood, UA Large (*)     RBC, UA TNTC (*)     All other components within normal limits   HEPATIC FUNCTION PANEL - Abnormal; Notable for the following:     Albumin 3.3 (*)     Globulin 3.8 (*)     All other components within normal limits   GLUCOSE WHOLE BLOOD - POCT - Abnormal; Notable for the following:     POCT - Glucose Whole blood 582 (*)     All other components within normal limits   GLUCOSE WHOLE BLOOD - POCT - Abnormal; Notable for the following:     POCT - Glucose Whole blood 410 (*)     All other components within normal limits   GLUCOSE WHOLE BLOOD - POCT - Abnormal; Notable for the following:     POCT - Glucose Whole blood 377 (*)     All other components within normal limits   URINE HCG QUALITATIVE   GFR    Narrative:     Critical Glucose     result called to  [id number]    . Readback confirmed, by  [id number] on 08/09/2015 at 02:07   LIPASE   TROPONIN I         Rads:     Radiology Results (24 Hour)     ** No results found for the last 24 hours. **          MDM and ED Course   DR. Chantella Creech  is the primary attending for this patient and has obtained and performed the history, PE, and medical decision making for this patient.    MDM:    Blood sugar elevated to almost 600, patient started on IV fluids and regular insulin, also given a dose of Lantus here in the emergency department.  Her blood sugar was monitored throughout the night.  Her labs demonstrated hyponatremia reflective of hyperglycemia.  Corrected sodium is within normal limits.  After treatment with IV fluids, her Accu-Chek was in a more normal range for her.  We will give her prescriptions to refill her insulins, discussed with her the importance of following up with her primary doctor for her diabetes management.    EKG Interpretation:   Signed and interpreted by ED Physician   Time Interpreted: 0229  Comparison: none  Rate: 82  Rhythm: sinus   Axis: normal   Intervals: normal  Blocks: none  ST segments: No  acute changes.   Interpretation: Normal EKG      Assessment/Plan:   Results and instructions reviewed at the bedside with patient and family.    Clinical Impression  Final diagnoses:   Hyperglycemia       Disposition  ED Disposition     Discharge Dorothy [name] discharge to home/self care.    Condition at disposition: Stable  Prescriptions  Discharge Medication List as of 08/09/2015  5:26 AM      START taking these medications    Details   insulin glargine (LANTUS) 100 UNIT/ML injection Inject 45 Units into the skin nightly., Starting 08/09/2015, Until Discontinued, Print                 Signed by: Vito Backers, MD  08/10/15 [phone]

## 2015-08-13 ENCOUNTER — Emergency Department: Payer: Medicare Other

## 2015-08-13 ENCOUNTER — Emergency Department
Admission: EM | Admit: 2015-08-13 | Discharge: 2015-08-13 | Disposition: A | Discharge status: Home / Self Care | Payer: Medicare Other | Attending: Emergency Medicine | Admitting: Emergency Medicine

## 2015-08-13 DIAGNOSIS — Z794 Long term (current) use of insulin: Secondary | ICD-10-CM | POA: Insufficient documentation

## 2015-08-13 DIAGNOSIS — R739 Hyperglycemia, unspecified: Secondary | ICD-10-CM

## 2015-08-13 DIAGNOSIS — E1165 Type 2 diabetes mellitus with hyperglycemia: Secondary | ICD-10-CM | POA: Insufficient documentation

## 2015-08-13 DIAGNOSIS — Z7984 Long term (current) use of oral hypoglycemic drugs: Secondary | ICD-10-CM | POA: Insufficient documentation

## 2015-08-13 DIAGNOSIS — B356 Tinea cruris: Secondary | ICD-10-CM | POA: Insufficient documentation

## 2015-08-13 DIAGNOSIS — J45909 Unspecified asthma, uncomplicated: Secondary | ICD-10-CM | POA: Insufficient documentation

## 2015-08-13 DIAGNOSIS — E7800 Pure hypercholesterolemia, unspecified: Secondary | ICD-10-CM | POA: Insufficient documentation

## 2015-08-13 DIAGNOSIS — Z79899 Other long term (current) drug therapy: Secondary | ICD-10-CM | POA: Insufficient documentation

## 2015-08-13 LAB — CBC AND DIFFERENTIAL
Basophils Absolute Automated: 0.02 10*3/uL (ref 0.00–0.20)
Basophils Automated: 0 %
Eosinophils Absolute Automated: 0.2 10*3/uL (ref 0.00–0.70)
Eosinophils Automated: 2 %
Hematocrit: 35.1 % — ABNORMAL LOW (ref 37.0–47.0)
Hgb: 12.4 g/dL (ref 12.0–16.0)
Immature Granulocytes Absolute: 0.02 10*3/uL
Immature Granulocytes: 0 %
Lymphocytes Absolute Automated: 2.36 10*3/uL (ref 0.50–4.40)
Lymphocytes Automated: 29 %
MCH: 27 pg — ABNORMAL LOW (ref 28.0–32.0)
MCHC: 35.3 g/dL (ref 32.0–36.0)
MCV: 76.5 fL — ABNORMAL LOW (ref 80.0–100.0)
MPV: 9.8 fL (ref 9.4–12.3)
Monocytes Absolute Automated: 0.36 10*3/uL (ref 0.00–1.20)
Monocytes: 4 %
Neutrophils Absolute: 5.25 10*3/uL (ref 1.80–8.10)
Neutrophils: 64 %
Nucleated RBC: 0 /100 WBC (ref 0–1)
Platelets: 384 10*3/uL (ref 140–400)
RBC: 4.59 10*6/uL (ref 4.20–5.40)
RDW: 15 % (ref 12–15)
WBC: 8.19 10*3/uL (ref 3.50–10.80)

## 2015-08-13 LAB — COMPREHENSIVE METABOLIC PANEL
ALT: 13 U/L (ref 0–55)
AST (SGOT): 14 U/L (ref 5–34)
Albumin/Globulin Ratio: 0.8 — ABNORMAL LOW (ref 0.9–2.2)
Albumin: 3.4 g/dL — ABNORMAL LOW (ref 3.5–5.0)
Alkaline Phosphatase: 111 U/L — ABNORMAL HIGH (ref 37–106)
Anion Gap: 19 — ABNORMAL HIGH (ref 5.0–15.0)
BUN: 11 mg/dL (ref 7–19)
Bilirubin, Total: 0.2 mg/dL (ref 0.2–1.2)
CO2: 18 mEq/L — ABNORMAL LOW (ref 22–29)
Calcium: 9.6 mg/dL (ref 8.5–10.5)
Chloride: 96 mEq/L — ABNORMAL LOW (ref 100–111)
Creatinine: 0.8 mg/dL (ref 0.6–1.0)
Globulin: 4.4 g/dL — ABNORMAL HIGH (ref 2.0–3.6)
Glucose: 445 mg/dL — ABNORMAL HIGH (ref 70–100)
Potassium: 3.9 mEq/L (ref 3.5–5.1)
Protein, Total: 7.8 g/dL (ref 6.0–8.3)
Sodium: 133 mEq/L — ABNORMAL LOW (ref 136–145)

## 2015-08-13 LAB — URINALYSIS, REFLEX TO MICROSCOPIC EXAM IF INDICATED
Bilirubin, UA: NEGATIVE
Glucose, UA: >=500 — AB
Ketones UA: 5 — AB
Leukocyte Esterase, UA: NEGATIVE
Nitrite, UA: NEGATIVE
Protein, UR: NEGATIVE
Specific Gravity UA: 1.031 (ref 1.001–1.035)
Urine pH: 6 (ref 5.0–8.0)
Urobilinogen, UA: NEGATIVE mg/dL

## 2015-08-13 LAB — GLUCOSE WHOLE BLOOD - POCT
Whole Blood Glucose POCT: 326 mg/dL — ABNORMAL HIGH (ref 70–100)
Whole Blood Glucose POCT: 440 mg/dL — ABNORMAL HIGH (ref 70–100)
Whole Blood Glucose POCT: 487 mg/dL — ABNORMAL HIGH (ref 70–100)

## 2015-08-13 LAB — ETHANOL: Alcohol: NOT DETECTED mg/dL

## 2015-08-13 LAB — RAPID DRUG SCREEN, URINE
Barbiturate Screen, UR: NEGATIVE
Benzodiazepine Screen, UR: NEGATIVE
Cannabinoid Screen, UR: NEGATIVE
Cocaine, UR: NEGATIVE
Opiate Screen, UR: NEGATIVE
PCP Screen, UR: NEGATIVE
Urine Amphetamine Screen: NEGATIVE

## 2015-08-13 LAB — GFR: EGFR: >60

## 2015-08-13 MED ORDER — INSULIN REGULAR HUMAN 100 UNIT/ML IJ SOLN
8.0000 [IU] | Freq: Once | INTRAMUSCULAR | Status: AC
Start: 2015-08-13 — End: 2015-08-13
  Administered 2015-08-13: 8 [IU] via SUBCUTANEOUS
  Filled 2015-08-13: qty 24

## 2015-08-13 MED ORDER — KETOROLAC TROMETHAMINE 30 MG/ML IJ SOLN
30.0000 mg | Freq: Once | INTRAMUSCULAR | Status: AC
Start: 2015-08-13 — End: 2015-08-13
  Administered 2015-08-13: 30 mg via INTRAVENOUS
  Filled 2015-08-13: qty 1

## 2015-08-13 MED ORDER — INSULIN REGULAR HUMAN 100 UNIT/ML IJ SOLN
10.0000 [IU] | Freq: Once | INTRAMUSCULAR | Status: AC
Start: 2015-08-13 — End: 2015-08-13

## 2015-08-13 MED ORDER — KETOCONAZOLE 2 % EX CREA
TOPICAL_CREAM | Freq: Two times a day (BID) | CUTANEOUS | Status: DC
Start: 2015-08-13 — End: 2015-09-02

## 2015-08-13 MED ORDER — INSULIN REGULAR HUMAN 100 UNIT/ML IJ SOLN
INTRAMUSCULAR | Status: AC
Start: 2015-08-13 — End: 2015-08-13
  Administered 2015-08-13: 10 [IU] via INTRAVENOUS
  Filled 2015-08-13: qty 30

## 2015-08-13 MED ORDER — SODIUM CHLORIDE 0.9 % IV BOLUS
1000.0000 mL | Freq: Once | INTRAVENOUS | Status: AC
Start: 2015-08-13 — End: 2015-08-13
  Administered 2015-08-13: 1000 mL via INTRAVENOUS

## 2015-08-13 NOTE — Discharge Instructions (Signed)
Blood Sugar Monitoring (Edu)    Why you were in the hospital.    Blood sugar testing is an important tool for improving your health. You have been diagnosed with a condition, or started on a medicine that makes your blood sugar run too high or too low.    There are several conditions that can cause abnormal blood sugar levels. These include diabetes, hypoglycemia (low sugar), liver disease, thyroid disease or other metabolic problems.     Certain medicines can also cause abnormal blood sugar levels. These include corticosteroids (prednisone), antipsychotic medicines and diabetes medicines.     Your doctor will tell you how often you should check your blood sugar. He or she will also tell you what your target blood sugar levels should be. The target level will be based on your age, your general health and any other medical problems you may have.    For most people, a general range of target blood sugars is as follows:   Before a meal: 70-130 mg/dL (4-7 mmol/L).   1 to 2 hours after a meal: less than 180 mg/dL (10 mmol/L).    Fasting more than 8 hours: 90-130 mg/dL (5-7 mmol/L).    Talk to your health care provider about what to do when your blood sugar levels are not in the normal range for your target goals.    How to test your blood sugar:   Wash your hands with soap and water.   Remove a test strip from the container. Put the cap back on the bottle.   Insert the test strip into the meter.   Prick your finger with the needle (lancet) provided with your test kit. Prick the side of your finger, rather than the tip. This causes less pain.   Gently squeeze or massage your finger until a drop of blood forms.   Touch the test strip to the blood. Avoid touching your skin.   The meter will show your blood sugar level on the screen.   Write down the level and bring a record of these measurements to appointments with your health care provider.   Note: You may experience pain or bruising where you test.  You may have minor bleeding at your testing site. Hold firm pressure for 5 minutes over the site if you keep bleeding from a testing site.    Follow the instructions in the user manual for your meter. Meters vary based on brand and style.   Change batteries in your meter as directed in the instruction manual.   Use test strips designed for your meter. Not all strips and meters will work together.   Store test strips as instructed.   Do not use expired test strips.   Clean the meter regularly.   Run quality control tests as directed in the user manual.   Check the manual if you have any other questions or problems with how the meter works.   Bring the meter with you to your appointments so you can ask questions.    What you should do.    You may be scheduled for a follow-up appointment with your health care provider or a specialist. This is to ensure he or she can properly follow up and discuss your plan of care. Call your doctor as soon as possible if you can't keep your appointment.    Signs of low blood sugar include:   Shakiness and general weakness.   Sweating.   Dizziness.   Headache.     Blurred vision.   Irritability.    Slurred (unclear) speech.   Drowsiness (feeling sleepy).   Confusion.   Fainting and/or seizures.    Heart palpitations (feeling your heart is beating abnormally).   Hunger.    If any of the above happens, do the following (if you can):   Test your blood sugar right away.    If your blood sugar is less than 70, eat a snack with some sugar. This can be a glass of orange juice or a piece of candy.    Then, eat a snack with some protein to help stabilize your blood sugar.   Recheck your sugar 15 minutes after your snack or sooner if your symptoms are not going away.     Call Emergency Medical Services (911) if you feel confused, faint (weak), have slurred speech or keep having low blood sugars.     Signs of low blood sugar include:   Urinating (peeing)  often.   Greater thirst.   Dry mouth.   Blurred vision.   Fatigue (tiredness).   Nausea.     If any of the above happens, do the following:   Test your blood sugar if you are having these symptoms.    If your level is higher than your target range, this is called hyperglycemia. Very high blood sugar can also be very dangerous.    Talk with your health care provider about what to do when your blood sugars are above your target goals.    Call Emergency Medical Services (911) if you feel confused, faint (weak) or you can't do the things necessary to take of your high blood sugar.    When you should contact your doctor.    If your blood sugar is below or above the targets discussed with your health care provider.   If you are having low blood sugars.   If you have loss of appetite, nausea (sick to stomach), vomiting or abdominal pain.   If you have sweet, fruity smelling breath (a sign of serious problems with high blood sugar).   If you think you have taken too much insulin or other diabetes medicine.   If you have signs of an infection.   If another doctor tells you to make changes in your medicines, vitamins, or supplements.   If you have been told you have kidney disease.   If you get sudden severe chest pain or have trouble breathing.   If you get a fever (temperature higher than 100.4F or 38C) or have chills.    When you should seek care at an Emergency Department.    YOU SHOULD SEEK MEDICAL ATTENTION IMMEDIATELY AT THE NEAREST EMERGENCY DEPARTMENT, IF ANY OF THE FOLLOWING HAPPENS:     You have major bleeding or bleeding that worries you.   You have any new or sudden warmth, redness, or swelling in the finger you use for testing.   You have trouble breathing or sudden shortness of breath.   You have chest pain, discomfort or tightness.   You have weakness, numbness or tingling in your arms or legs.   You have any of the above symptoms and can't reach your primary care doctor.                Diabetic Diet (Edu)    You have been given information about diet guidelines for diabetes control. Use these only as general dietary guidelines. You should also get formal diabetes instruction from a specialist.      Diabetes is when your body cannot make insulin or use it properly. Insulin is a hormone that controls your blood sugar. High blood sugars can be very serious. Over time, they can lead to other serious health problems. These include vision loss, sensation loss in the feet, heart disease, stroke, and kidney problems.     Eating healthy helps to lower your blood sugar. It is one of the most important steps in managing diabetes.     A diabetes expert can help you manage your diabetes. This could be your doctor or endocrinologist (a doctor who specializes in the body's hormone system). It could also be a registered dietician or nutritionist. They can give you flexible meal plans to go with your lifestyle and other health needs.     A healthy diabetic diet includes a balance of foods. Use the food pyramid for reference. You can keep your blood sugars steady by:   Limiting sweet foods and fats.   Being careful about how many carbohydrates you eat, and when you eat them.   Eating a lot of whole grain foods, fruits and vegetables.    You may be taking medicine for your diabetes. If so, it is very important to follow a schedule for your meals and snacks.    Here are some general guidelines:   Fats and sweets are not as nutritious or healthy as other foods. Fats have a lot of calories. Sweets can be high in carbohydrates and fats. Some sweet foods with a lot of fat and calories are ice cream, pie, syrup, cookies, doughnuts and cake. Other examples of fats are salad dressing, oil, cream cheese, butter, mayonnaise, olives and bacon. You do not have to get rid of these foods completely. Instead, try sugar-free popsicles or cocoa mix, fat-free ice cream or frozen yogurt or diet soda.   Other  carbohydrates are bread, grains, cereal, pasta, and some vegetables like corn and potatoes. These foods give you vitamins, minerals and fiber. Whole grain starches are healthier. They have more of the vitamins, minerals and fiber your body needs. You should eat starches at every meal.   Meat and meat substitutes give your body the protein it needs. The meat and meat substitutes group includes chicken, beef, fish, pork, ham, lamb, turkey, eggs, cheese and tofu. Eat small amounts of these foods every day. Buy cuts of meat that have just a small amount of fat. Trim off any extra fat. Eat chicken or turkey without the skin. Cook the meat in low-fat ways. This includes broiling, grilling, roasting and steaming. Try vinegar, lemon juice, soy sauce, salsa, ketchup, herbs and spices for extra flavor.    Measure your food. This helps control your meal portions or serving sizes. This will help you maintain a good body weight. It will also help with a steady blood sugar level. Use measuring cups or spoons to make sure your servings are the right size. You can also use a food scale. You can find all nutrition facts and serving sizes on food package labels. Talk to your nutritionist about portions of each of the food groups.     For more information, talk to your doctor, nurse or nutritionist. You can also contact your local American Diabetes Association. www.diabetes.org

## 2015-08-13 NOTE — ED Provider Notes (Addendum)
Physician/Midlevel provider first contact with patient: 08/13/15 0034         History     Chief Complaint   Patient presents with   . Hyperglycemia     HPI Comments: High blood sugar and rash left groin with headache chronic    No nausea/vomiting no diarrhea, no abdominal pain no dysuria no fever no chest pain no shortness of breath no dizziness.    Patient is a 40 y.o. female presenting with general illness.   Illness           Past Medical History   Diagnosis Date   . Diabetes mellitus without complication    . Asthma without status asthmaticus    . Anemia    . Depression    . Anemia    . Panic attack    . Seasonal allergies    . High cholesterol        Past Surgical History   Procedure Laterality Date   . Cesarean section     . Cholecystectomy     . Tonsillectomy     . Hernia repair         Family History   Problem Relation Age of Onset   . Diabetes Mother    . Diabetes Sister    . Diabetes Maternal Aunt    . Hypertension Paternal Grandmother    . No known problems Daughter    . No known problems Son    . No known problems Daughter    . No known problems Son        Social  Social History   Substance Use Topics   . Smoking status: Never Smoker    . Smokeless tobacco: None   . Alcohol Use: No       .     Allergies   Allergen Reactions   . Latex    . Percocet [Oxycodone-Acetaminophen]      Unknown reaction     . Tape        Home Medications     Last Medication Reconciliation Action:  Complete Renita Papa, RN 08/13/2015 12:35 AM                  ATORVASTATIN CALCIUM PO     Take by mouth.     FREESTYLE TEST STRIPS test strip     Check blood sugar twice a day.     GABAPENTIN PO     Take by mouth.     glucose monitoring kit (FREESTYLE) monitoring kit     use as instructed     ibuprofen (ADVIL,MOTRIN) 600 MG tablet     Take 1 tablet (600 mg total) by mouth every 6 (six) hours as needed for Pain or Fever.     insulin aspart (NOVOLOG) 100 UNIT/ML injection     Inject 3 Units into the skin 3 (three) times daily  before meals.     insulin glargine (LANTUS) 100 UNIT/ML injection     Inject 45 Units into the skin nightly.     Lancets (FREESTYLE) lancets     Use as instructed     metFORMIN (GLUCOPHAGE) 500 MG tablet     Take 1 tablet (500 mg total) by mouth 2 (two) times daily with meals.     VITAMIN D, ERGOCALCIFEROL, PO     Take by mouth.        Ongoing Comment    Katherene Ponto RN    09/13/2012  4:37 PM  Unknown medications           Review of Systems   All other systems reviewed and are negative.      Physical Exam    BP: 136/79 mmHg, Heart Rate: 87, Temp: 97.8 F (36.6 C), Resp Rate: 18, SpO2: 99 %, Weight: 138.891 kg    Physical Exam   Constitutional: She is oriented to person, place, and time. She appears well-developed and well-nourished. No distress.   HENT:   Head: Normocephalic and atraumatic.   Right Ear: External ear normal.   Left Ear: External ear normal.   Nose: Nose normal.   Mouth/Throat: Oropharynx is clear and moist.   Eyes: Conjunctivae and EOM are normal. Pupils are equal, round, and reactive to light.   Neck: Normal range of motion. Neck supple.   Cardiovascular: Normal rate, regular rhythm, normal heart sounds and intact distal pulses.  Exam reveals no gallop and no friction rub.    No murmur heard.  Pulmonary/Chest: Effort normal and breath sounds normal. No respiratory distress. She has no wheezes. She has no rales. She exhibits no tenderness.   Abdominal: Soft. Bowel sounds are normal. She exhibits no distension. There is no tenderness. There is no rebound and no guarding.   Musculoskeletal: Normal range of motion.   Neurological: She is alert and oriented to person, place, and time. She has normal reflexes. No cranial nerve deficit.   Skin: Skin is warm and dry. Rash noted. She is not diaphoretic.   Lefty groin tinea cruris rash intertrigo macerated red and scales   Psychiatric: She has a normal mood and affect.   Nursing note and vitals reviewed.        MDM and ED Course     ED Medication  Orders     Start Ordered     Status Ordering Provider    08/13/15 0259 08/13/15 0258  sodium chloride 0.9 % bolus 1,000 mL   Once     Route: Intravenous  Ordered Dose: 1,000 mL     Last MAR action:  New Bag Synethia Endicott ALAN    08/13/15 0220 08/13/15 0254  insulin regular (HumuLIN R,NovoLIN R) injection 10 Units   Once     Route: Intravenous  Ordered Dose: 10 Units     Last MAR action:  Given Zekiah Caruth ALAN    08/13/15 0047 08/13/15 0046  ketorolac (TORADOL) injection 30 mg   Once     Route: Intravenous  Ordered Dose: 30 mg     Last MAR action:  Given Shavonte Zhao ALAN    08/13/15 0044 08/13/15 0043  insulin regular (HumuLIN R,NovoLIN R) injection 8 Units   Once     Route: Subcutaneous  Ordered Dose: 8 Units     Last MAR action:  Given Obadiah Dennard ALAN             MDM  Number of Diagnoses or Management Options  Hyperglycemia:   Tinea cruris:      Amount and/or Complexity of Data Reviewed  Clinical lab tests: ordered  Tests in the radiology section of CPT: ordered    Risk of Complications, Morbidity, and/or Mortality  Presenting problems: moderate  Diagnostic procedures: moderate  Management options: moderate    Patient Progress  Patient progress: stable            Procedures    Clinical Impression & Disposition     Clinical Impression  Final diagnoses:   Hyperglycemia   Tinea cruris  ED Disposition     Discharge Allegra Grana discharge to home/self care.    Condition at disposition: Stable             Discharge Medication List as of 08/13/2015  3:33 AM      START taking these medications    Details   ketoconazole (NIZORAL) 2 % cream Apply topically 2 (two) times daily. Apply to rash left gorin, Starting 08/13/2015, Until Discontinued, Print                         Donnell Wion, Claudius Sis, MD  08/13/15 0045    Joseph Berkshire, MD  08/13/15 1610    Joseph Berkshire, MD  08/13/15 [phone]

## 2015-08-13 NOTE — ED Notes (Signed)
Pt BIBA c/o intermittent, throbbing headache in the middle of her forehead and some blurred vision. Denies nausea, vomiting. Alert + oriented. Ambulatory. DEXI 355 in ambulance.

## 2015-08-13 NOTE — ED Notes (Signed)
Bed: E10  Expected date:   Expected time:   Means of arrival:   Comments:

## 2015-08-14 ENCOUNTER — Emergency Department: Payer: Medicare Other

## 2015-08-14 ENCOUNTER — Emergency Department
Admission: EM | Admit: 2015-08-14 | Discharge: 2015-08-15 | Disposition: A | Discharge status: Home / Self Care | Payer: Medicare Other | Attending: Emergency Medicine | Admitting: Emergency Medicine

## 2015-08-14 DIAGNOSIS — E7800 Pure hypercholesterolemia, unspecified: Secondary | ICD-10-CM | POA: Insufficient documentation

## 2015-08-14 DIAGNOSIS — J45909 Unspecified asthma, uncomplicated: Secondary | ICD-10-CM | POA: Insufficient documentation

## 2015-08-14 DIAGNOSIS — Z79899 Other long term (current) drug therapy: Secondary | ICD-10-CM | POA: Insufficient documentation

## 2015-08-14 DIAGNOSIS — E1165 Type 2 diabetes mellitus with hyperglycemia: Secondary | ICD-10-CM | POA: Insufficient documentation

## 2015-08-14 DIAGNOSIS — Z7984 Long term (current) use of oral hypoglycemic drugs: Secondary | ICD-10-CM | POA: Insufficient documentation

## 2015-08-14 DIAGNOSIS — Z885 Allergy status to narcotic agent status: Secondary | ICD-10-CM | POA: Insufficient documentation

## 2015-08-14 DIAGNOSIS — R739 Hyperglycemia, unspecified: Secondary | ICD-10-CM

## 2015-08-14 DIAGNOSIS — Z794 Long term (current) use of insulin: Secondary | ICD-10-CM | POA: Insufficient documentation

## 2015-08-14 LAB — ECG 12-LEAD
Atrial Rate: 82 {beats}/min
P Axis: 52 degrees
P-R Interval: 162 ms
Q-T Interval: 398 ms
QRS Duration: 112 ms
QTC Calculation (Bezet): 464 ms
R Axis: 2 degrees
T Axis: 14 degrees
Ventricular Rate: 82 {beats}/min

## 2015-08-14 LAB — GLUCOSE WHOLE BLOOD - POCT
Whole Blood Glucose POCT: 385 mg/dL — ABNORMAL HIGH (ref 70–100)
Whole Blood Glucose POCT: 394 mg/dL — ABNORMAL HIGH (ref 70–100)

## 2015-08-14 MED ORDER — ACETAMINOPHEN 325 MG PO TABS
650.0000 mg | ORAL_TABLET | Freq: Once | ORAL | Status: AC
Start: 2015-08-14 — End: 2015-08-14
  Administered 2015-08-14: 650 mg via ORAL
  Filled 2015-08-14: qty 2

## 2015-08-14 MED ORDER — INSULIN REGULAR HUMAN 100 UNIT/ML IJ SOLN
6.0000 [IU] | Freq: Once | INTRAMUSCULAR | Status: AC
Start: 2015-08-14 — End: 2015-08-14
  Administered 2015-08-14: 6 [IU] via SUBCUTANEOUS
  Filled 2015-08-14: qty 18

## 2015-08-14 NOTE — ED Notes (Signed)
Bed: GR6  Expected date:   Expected time:   Means of arrival: [id number]  Comments:  hyperglycemia

## 2015-08-14 NOTE — ED Notes (Signed)
Dorothy [name] is a 40 y.o. female, BIBA from [facility] for Blood sugar of 492. No Insulin was given. Lab works were done. Patient is AAox4. Denies chest pain. Breathing is even and unlabored.

## 2015-08-14 NOTE — ED Provider Notes (Addendum)
EMERGENCY DEPARTMENT HISTORY AND PHYSICAL EXAM     Physician/Midlevel provider first contact with patient: 08/14/15 2225         Date: 08/14/2015  Patient Name: Dorothy [name]    History of Presenting Illness     Chief Complaint   Patient presents with   . Hyperglycemia     BG 451     History Provided By: Patient    Chief Complaint: Hyperglycemia  Onset: Earlier today  Location:Blood  Quality: glucose 451--Higher than normal   Severity: Moderate  Modifying factors: Pt hasn't taken her insulin today.  Associated Symptoms: none    Additional History: Dorothy [name] is a 40 y.o. female who is a type II diabetic presenting to the ED BIBA for c/o hyperglycemia that has been uncontrolled today. Pt is homeless and does not have glucometer so she has not been able to check her sugar to take her insulin. Pt was seen earlier today at [facility]. Pt otherwise feels fine, no complaints. Labs drawn at [facility] were unremarkable except for hyperglycemia.     PCP: Pcp, Noneorunknown, MD  SPECIALISTS:    No current facility-administered medications for this encounter.     Current Outpatient Prescriptions   Medication Sig Dispense Refill   . ATORVASTATIN CALCIUM PO Take by mouth.     . [employer] LANCETS MICRO THIN 33G Misc Check blood sugar bid 100 each 0   . FREESTYLE TEST STRIPS test strip Check blood sugar twice a day. 100 each 0   . GABAPENTIN PO Take by mouth.     Marland Kitchen glucose monitoring kit (FREESTYLE) monitoring kit use as instructed 1 each 0   . ibuprofen (ADVIL,MOTRIN) 600 MG tablet Take 1 tablet (600 mg total) by mouth every 6 (six) hours as needed for Pain or Fever. 30 tablet 0   . insulin aspart (NOVOLOG) 100 UNIT/ML injection Inject 3 Units into the skin 3 (three) times daily before meals. 10 mL 0   . insulin glargine (LANTUS) 100 UNIT/ML injection Inject 45 Units into the skin nightly. 10 mL 0   . ketoconazole (NIZORAL) 2 % cream Apply topically 2 (two) times daily. Apply to rash left gorin 30 g 1   . Lancet Device  Misc Dispense one 1 each 0   . Lancets (FREESTYLE) lancets Use as instructed 100 each 0   . liraglutide (VICTOZA) 18 MG/3ML Solution Pen-injector      . lisinopril (PRINIVIL,ZESTRIL) 20 MG tablet Take 20 mg by mouth daily.     . metFORMIN (GLUCOPHAGE) 500 MG tablet Take 1 tablet (500 mg total) by mouth 2 (two) times daily with meals. 14 tablet 0   . VITAMIN D, ERGOCALCIFEROL, PO Take by mouth.         Past History     Past Medical History:  Past Medical History   Diagnosis Date   . Diabetes mellitus without complication    . Asthma without status asthmaticus    . Anemia    . Depression    . Anemia    . Panic attack    . Seasonal allergies    . High cholesterol        Past Surgical History:  Past Surgical History   Procedure Laterality Date   . Cesarean section     . Cholecystectomy     . Tonsillectomy     . Hernia repair         Family History:  Family History   Problem Relation Age  of Onset   . Diabetes Mother    . Diabetes Sister    . Diabetes Maternal Aunt    . Hypertension Paternal Grandmother    . No known problems Daughter    . No known problems Son    . No known problems Daughter    . No known problems Son        Social History:  Social History   Substance Use Topics   . Smoking status: Never Smoker    . Smokeless tobacco: None   . Alcohol Use: No       Allergies:  Allergies   Allergen Reactions   . Latex    . Morphine Itching     Generalized bumps with itchiness   . Percocet [Oxycodone-Acetaminophen]      Unknown reaction     . Tape        Review of Systems     Review of Systems   Constitutional: Negative for fever and fatigue.   HENT: Negative for rhinorrhea and sore throat.    Eyes: Negative for discharge, redness and visual disturbance.   Respiratory: Negative for cough and shortness of breath.    Cardiovascular: Negative for chest pain and leg swelling.   Gastrointestinal: Negative for nausea and abdominal pain.   Endocrine: Negative for polyuria.   Genitourinary: Negative for dysuria, urgency, frequency  and flank pain.   Musculoskeletal: Negative for back pain, neck pain and neck stiffness.   Skin: Negative for rash.   Allergic/Immunologic: Negative for immunocompromised state.   Neurological: Negative for light-headedness and headaches.   Hematological: Does not bruise/bleed easily.   Psychiatric/Behavioral: Negative for suicidal ideas.         Physical Exam   BP 108/61 mmHg  Pulse 76  Temp(Src) 97 F (36.1 C) (Oral)  Resp 14  Ht 5\' 11"  (1.803 m)  Wt 137.44 kg  BMI 42.28 kg/m2  SpO2 97%  LMP 08/06/2015    Constitutional: Vital signs reviewed. Well appearing. Obese. No distress.  Head: Normocephalic, atraumatic  Eyes: Conjunctiva and sclera are normal.  No injection or discharge.  Ears, Nose, Throat:  Normal external examination of the nose and ears.  Mucous membranes moist.  Neck: Normal range of motion. Supple, no meningeal signs. Trachea midline.  Respiratory/Chest: Clear to auscultation. No respiratory distress.   Cardiovascular: Regular rate and rhythm. No murmurs.  Abdomen:  Bowel sounds intact. No rebound or guarding. Soft.  Non-tender.  Back: No cva tenderness to percussion.  Upper Extremity:  No edema. No cyanosis. Bilateral radial pulses intact and equal.   Lower Extremity:  No edema. No cyanosis. Bilateral calves symmetrical and non-tender.   Skin: Warm and dry. No rash.  Neuro: A&Ox3. Moves all extremities spontaneously. Normal gait.   Psychiatric: Normal affect.  Normal insight.      Diagnostic Study Results     Labs -     Results     Procedure Component Value Units Date/Time    Glucose Whole Blood - POCT [[id number]]  (Abnormal) Collected:  08/14/15 2354     POCT - Glucose Whole blood 385 (H) mg/dL Updated:  04/26/70 5366    Glucose Whole Blood - POCT [[id number]]  (Abnormal) Collected:  08/14/15 2237     POCT - Glucose Whole blood 394 (H) mg/dL Updated:  95/63/87 5643          Radiologic Studies -   Radiology Results (24 Hour)     ** No results found for the last 24  hours. **      .    Medical  Decision Making   I am the first provider for this patient.    I reviewed the vital signs, available nursing notes, past medical history, past surgical history, family history and social history.    Vital Signs-Reviewed the patient's vital signs.     Patient Vitals for the past 12 hrs:   BP Temp Pulse Resp   08/15/15 0143 108/61 mmHg 97 F (36.1 C) 76 14   08/14/15 2231 125/72 mmHg 97.5 F (36.4 C) 83 16     Pulse Oximetry Analysis - Normal 97% on RA    Old Medical Records: Nursing notes.   Old records  Outside records (patient firs today).     ED Course:       2:10 AM - Discussed results with pt and counseled on diagnosis, f/u plans, medication use, and signs and symptoms when to return to ED.   Pt voices understanding and agreement with plan. All questions and concerns addressed.         Provider Notes: Pt well appearing. [facility] labs drawn PTA show hyperglycemia, no DKA. Vital signs unremarkable. Given dose of insulin and rx for glucometer and lancets. Provided referral to primary care.     Diagnosis     Clinical Impression:   1. Hyperglycemia        Treatment Plan:   ED Disposition     Discharge KIYOKO MCGUIRT discharge to home/self care.    Condition at disposition: Stable              _______________________________      Attestations: This note is prepared by Marland Kitchen, acting as scribe for Lynnea Ferrier, MD. The scribe's documentation has been prepared under my direction and personally reviewed by me in its entirety.  I confirm that the note above accurately reflects all work, treatment, procedures, and medical decision making performed by me.    _______________________________    Maryella Shivers, MD  08/15/15 1722    Maryella Shivers, MD  08/15/15 1723

## 2015-08-14 NOTE — ED Notes (Addendum)
Glucose 394 mg/dl

## 2015-08-15 MED ORDER — CVS LANCETS MICRO THIN 33G MISC
Status: DC
Start: 2015-08-15 — End: 2015-09-02

## 2015-08-15 MED ORDER — LANCET DEVICE MISC
Status: DC
Start: 2015-08-15 — End: 2015-09-02

## 2015-08-15 NOTE — Discharge Instructions (Signed)
Blood Sugar Monitoring (Edu)     Why you were in the hospital…     Blood sugar testing is an important tool for improving your health. You have been diagnosed with a condition, or started on a medicine that makes your blood sugar run too high or too low.     There are several conditions that can cause abnormal blood sugar levels. These include diabetes, hypoglycemia (low sugar), liver disease, thyroid disease or other metabolic problems.      Certain medicines can also cause abnormal blood sugar levels. These include corticosteroids (prednisone), antipsychotic medicines and diabetes medicines.      Your doctor will tell you how often you should check your blood sugar. He or she will also tell you what your target blood sugar levels should be. The target level will be based on your age, your general health and any other medical problems you may have.     For most people, a general range of target blood sugars is as follows:  · Before a meal: 70-130 mg/dL (4-7 mmol/L).  · 1 to 2 hours after a meal: less than 180 mg/dL (10 mmol/L).   · Fasting more than 8 hours: 90-130 mg/dL (5-7 mmol/L).     Talk to your health care provider about what to do when your blood sugar levels are not in the normal range for your target goals.     How to test your blood sugar:  · Wash your hands with soap and water.  · Remove a test strip from the container. Put the cap back on the bottle.  · Insert the test strip into the meter.  · Prick your finger with the needle (lancet) provided with your test kit. Prick the side of your finger, rather than the tip. This causes less pain.  · Gently squeeze or massage your finger until a drop of blood forms.  · Touch the test strip to the blood. Avoid touching your skin.  · The meter will show your blood sugar level on the screen.  · Write down the level and bring a record of these measurements to appointments with your health care provider.  · Note: You may experience pain or bruising where you test.  You may have minor bleeding at your testing site. Hold firm pressure for 5 minutes over the site if you keep bleeding from a testing site.     Follow the instructions in the user manual for your meter. Meters vary based on brand and style.  · Change batteries in your meter as directed in the instruction manual.  · Use test strips designed for your meter. Not all strips and meters will work together.  · Store test strips as instructed.  · Do not use expired test strips.  · Clean the meter regularly.  · Run quality control tests as directed in the user manual.  · Check the manual if you have any other questions or problems with how the meter works.  · Bring the meter with you to your appointments so you can ask questions.     What you should do…     You may be scheduled for a follow-up appointment with your health care provider or a specialist. This is to ensure he or she can properly follow up and discuss your plan of care. Call your doctor as soon as possible if you can t keep your appointment.     Signs of low blood sugar include:  · Shakiness and general weakness.  · Sweating.  · Dizziness.  · Headache.  ·   Blurred vision.  · Irritability.   · Slurred (unclear) speech.  · Drowsiness (feeling sleepy).  · Confusion.  · Fainting and/or seizures.   · Heart palpitations (feeling your heart is beating abnormally).  · Hunger.     If any of the above happens, do the following (if you can):  · Test your blood sugar right away.   · If your blood sugar is less than 70, eat a snack with some sugar. This can be a glass of orange juice or a piece of candy.   · Then, eat a snack with some protein to help stabilize your blood sugar.  · Recheck your sugar 15 minutes after your snack or sooner if your symptoms are not going away.      Call Emergency Medical Services (911) if you feel confused, faint (weak), have slurred speech or keep having low blood sugars.      Signs of low blood sugar include:  · Urinating (peeing)  often.  · Greater thirst.  · Dry mouth.  · Blurred vision.  · Fatigue (tiredness).  · Nausea.      If any of the above happens, do the following:  · Test your blood sugar if you are having these symptoms.   · If your level is higher than your target range, this is called hyperglycemia. Very high blood sugar can also be very dangerous.   · Talk with your health care provider about what to do when your blood sugars are above your target goals.   · Call Emergency Medical Services (911) if you feel confused, faint (weak) or you can t do the things necessary to take of your high blood sugar.     When you should contact your doctor…  · If your blood sugar is below or above the targets discussed with your health care provider.  · If you are having low blood sugars.  · If you have loss of appetite, nausea (sick to stomach), vomiting or abdominal pain.  · If you have sweet, fruity smelling breath (a sign of serious problems with high blood sugar).  · If you think you have taken too much insulin or other diabetes medicine.  · If you have signs of an infection.  · If another doctor tells you to make changes in your medicines, vitamins, or supplements.  · If you have been told you have kidney disease.  · If you get sudden severe chest pain or have trouble breathing.  · If you get a fever (temperature higher than 100.4°F or 38°C) or have chills.     When you should seek care at an Emergency Department…     YOU SHOULD SEEK MEDICAL ATTENTION IMMEDIATELY AT THE NEAREST EMERGENCY DEPARTMENT, IF ANY OF THE FOLLOWING HAPPENS:     · You have major bleeding or bleeding that worries you.  · You have any new or sudden warmth, redness, or swelling in the finger you use for testing.  · You have trouble breathing or sudden shortness of breath.  · You have chest pain, discomfort or tightness.  · You have weakness, numbness or tingling in your arms or legs.  · You have any of the above symptoms and can t reach your primary care doctor.

## 2015-08-20 ENCOUNTER — Emergency Department
Admission: EM | Admit: 2015-08-20 | Discharge: 2015-08-20 | Disposition: A | Discharge status: Home / Self Care | Payer: Medicare Other | Attending: Emergency Medicine | Admitting: Emergency Medicine

## 2015-08-20 ENCOUNTER — Emergency Department: Payer: Medicare Other

## 2015-08-20 DIAGNOSIS — Z8709 Personal history of other diseases of the respiratory system: Secondary | ICD-10-CM | POA: Insufficient documentation

## 2015-08-20 DIAGNOSIS — E7800 Pure hypercholesterolemia, unspecified: Secondary | ICD-10-CM | POA: Insufficient documentation

## 2015-08-20 DIAGNOSIS — Z79899 Other long term (current) drug therapy: Secondary | ICD-10-CM | POA: Insufficient documentation

## 2015-08-20 DIAGNOSIS — E1165 Type 2 diabetes mellitus with hyperglycemia: Secondary | ICD-10-CM | POA: Insufficient documentation

## 2015-08-20 DIAGNOSIS — R739 Hyperglycemia, unspecified: Secondary | ICD-10-CM

## 2015-08-20 DIAGNOSIS — R0789 Other chest pain: Secondary | ICD-10-CM | POA: Insufficient documentation

## 2015-08-20 DIAGNOSIS — Z91199 Patient's noncompliance with other medical treatment and regimen due to unspecified reason: Secondary | ICD-10-CM

## 2015-08-20 DIAGNOSIS — Z794 Long term (current) use of insulin: Secondary | ICD-10-CM | POA: Insufficient documentation

## 2015-08-20 DIAGNOSIS — Z9114 Patient's other noncompliance with medication regimen: Secondary | ICD-10-CM | POA: Insufficient documentation

## 2015-08-20 DIAGNOSIS — Z7984 Long term (current) use of oral hypoglycemic drugs: Secondary | ICD-10-CM | POA: Insufficient documentation

## 2015-08-20 LAB — BASIC METABOLIC PANEL
Anion Gap: 10 (ref 5.0–15.0)
BUN: 10 mg/dL (ref 7.0–19.0)
CO2: 24 mEq/L (ref 22–29)
Calcium: 9.6 mg/dL (ref 8.5–10.5)
Chloride: 100 mEq/L (ref 100–111)
Creatinine: 0.8 mg/dL (ref 0.6–1.0)
Glucose: 310 mg/dL — ABNORMAL HIGH (ref 70–100)
Potassium: 4.1 mEq/L (ref 3.5–5.1)
Sodium: 134 mEq/L — ABNORMAL LOW (ref 136–145)

## 2015-08-20 LAB — GLUCOSE WHOLE BLOOD - POCT
Whole Blood Glucose POCT: 321 mg/dL — ABNORMAL HIGH (ref 70–100)
Whole Blood Glucose POCT: 294 mg/dL — ABNORMAL HIGH (ref 70–100)

## 2015-08-20 LAB — GFR: EGFR: >60

## 2015-08-20 LAB — TROPONIN I: Troponin I: <0.01 ng/mL (ref 0.00–0.09)

## 2015-08-20 LAB — HEMOLYSIS INDEX: Hemolysis Index: 16 (ref 0–18)

## 2015-08-20 MED ORDER — ACETAMINOPHEN 500 MG PO TABS
1000.0000 mg | ORAL_TABLET | Freq: Once | ORAL | Status: AC
Start: 2015-08-20 — End: 2015-08-20
  Administered 2015-08-20: 1000 mg via ORAL
  Filled 2015-08-20: qty 2

## 2015-08-20 MED ORDER — SODIUM CHLORIDE 0.9 % IV BOLUS
1000.0000 mL | Freq: Once | INTRAVENOUS | Status: AC
Start: 2015-08-20 — End: 2015-08-20
  Administered 2015-08-20: 1000 mL via INTRAVENOUS

## 2015-08-20 MED ORDER — INSULIN REGULAR HUMAN 100 UNIT/ML IJ SOLN
10.0000 [IU] | Freq: Once | INTRAMUSCULAR | Status: AC
Start: 2015-08-20 — End: 2015-08-20
  Administered 2015-08-20: 10 [IU] via SUBCUTANEOUS
  Filled 2015-08-20: qty 30

## 2015-08-20 NOTE — ED Provider Notes (Signed)
EMERGENCY DEPARTMENT HISTORY AND PHYSICAL EXAM     Physician/Midlevel provider first contact with patient: 08/20/15 2020         Date: 08/20/2015  Patient Name: Dorothy [name]    History of Presenting Illness     Chief Complaint   Patient presents with   . Chest Pain       History Provided By: Patient    Chief Complaint: Chest pain  Onset: 5pm this evening  Timing: Intermittent  Location: Left sided  Quality: Sharp  Severity: Moderate  Exacerbating factors: Worse during palpation  Pertinent Negatives: No fever    Additional History: Dorothy [name] is a 40 y.o. female with a hx of DM presenting to the ED complaining of intermittent left sided chest pain, which began at 5pm this evening. Pt describes the pain as sharp. Pt notes the pain is worse during palpation. Pt notes no recent injury or trauma to her chest. Pt denies fever.       PCP: Pcp, Noneorunknown, MD  SPECIALISTS:    Current Facility-Administered Medications   Medication Dose Route Frequency Provider Last Rate Last Dose   . sodium chloride 0.9 % bolus 1,000 mL  1,000 mL Intravenous Once Sharyne Richters, MD 1,000 mL/hr at 08/20/15 2047 1,000 mL at 08/20/15 2047     Current Outpatient Prescriptions   Medication Sig Dispense Refill   . ibuprofen (ADVIL,MOTRIN) 600 MG tablet Take 1 tablet (600 mg total) by mouth every 6 (six) hours as needed for Pain or Fever. 30 tablet 0   . liraglutide (VICTOZA) 18 MG/3ML Solution Pen-injector      . lisinopril (PRINIVIL,ZESTRIL) 20 MG tablet Take 20 mg by mouth daily.     . metFORMIN (GLUCOPHAGE) 500 MG tablet Take 1 tablet (500 mg total) by mouth 2 (two) times daily with meals. 14 tablet 0   . ATORVASTATIN CALCIUM PO Take by mouth.     . [employer] LANCETS MICRO THIN 33G Misc Check blood sugar bid 100 each 0   . FREESTYLE TEST STRIPS test strip Check blood sugar twice a day. 100 each 0   . GABAPENTIN PO Take by mouth.     Marland Kitchen glucose monitoring kit (FREESTYLE) monitoring kit use as instructed 1 each 0   . insulin  aspart (NOVOLOG) 100 UNIT/ML injection Inject 3 Units into the skin 3 (three) times daily before meals. 10 mL 0   . insulin glargine (LANTUS) 100 UNIT/ML injection Inject 45 Units into the skin nightly. 10 mL 0   . ketoconazole (NIZORAL) 2 % cream Apply topically 2 (two) times daily. Apply to rash left gorin 30 g 1   . Lancet Device Misc Dispense one 1 each 0   . Lancets (FREESTYLE) lancets Use as instructed 100 each 0   . VITAMIN D, ERGOCALCIFEROL, PO Take by mouth.         Past History     Past Medical History:  Past Medical History   Diagnosis Date   . Diabetes mellitus without complication    . Asthma without status asthmaticus    . Anemia    . Depression    . Anemia    . Panic attack    . Seasonal allergies    . High cholesterol        Past Surgical History:  Past Surgical History   Procedure Laterality Date   . Cesarean section     . Cholecystectomy     . Tonsillectomy     .  Hernia repair         Family History:  Family History   Problem Relation Age of Onset   . Diabetes Mother    . Diabetes Sister    . Diabetes Maternal Aunt    . Hypertension Paternal Grandmother    . No known problems Daughter    . No known problems Son    . No known problems Daughter    . No known problems Son        Social History:  Social History   Substance Use Topics   . Smoking status: Never Smoker    . Smokeless tobacco: None   . Alcohol Use: No       Allergies:  Allergies   Allergen Reactions   . Latex    . Morphine Itching     Generalized bumps with itchiness   . Percocet [Oxycodone-Acetaminophen]      Unknown reaction     . Tape        Review of Systems     Review of Systems   Constitutional: Negative for fever.   Cardiovascular: Positive for chest pain.   All other systems reviewed and are negative.        Physical Exam   BP 129/62 mmHg  Pulse 97  Temp(Src) 98.1 F (36.7 C) (Oral)  Resp 18  SpO2 99%  LMP 08/06/2015    Physical Exam   Constitutional: Patient is oriented to person, place, and time and well-developed,  well-nourished, and in no distress.   Head: Atraumatic.   Eyes: EOMI. PERRL  ENT:  MMM.   Neck:  FROM. No spinal tenderness. Neck supple.    Cardiovascular: Normal rate and regular rhythm.   Pulmonary/Chest: Effort normal and breath sounds normal. No respiratory distress.  Pain reproducible over sternal area w/light palpation.   Abdominal: Soft. There is no tenderness. Bowel sounds present and normal.    Musculoskeletal:  No lower extremity edema or tenderness.    Neurological: Patient is alert and oriented to person, place, and time.  No focal deficits.   Skin: Skin is warm and dry.    Diagnostic Study Results     Labs -     Results     Procedure Component Value Units Date/Time    Glucose Whole Blood - POCT [[id number]]  (Abnormal) Collected:  08/20/15 2132     POCT - Glucose Whole blood 294 (H) mg/dL Updated:  09/32/35 5732    Troponin I [[id number]] Collected:  08/20/15 2035    Specimen Information:  Blood Updated:  08/20/15 2112     Troponin I <0.01 ng/mL     Basic Metabolic Panel (BMP) [[id number]]  (Abnormal) Collected:  08/20/15 2035    Specimen Information:  Blood Updated:  08/20/15 2105     Glucose 310 (H) mg/dL      BUN 17.6 mg/dL      Creatinine 0.8 mg/dL      Calcium 9.6 mg/dL      Sodium 160 (L) mEq/L      Potassium 4.1 mEq/L      Chloride 100 mEq/L      CO2 24 mEq/L      Anion Gap 10.0     Hemolysis index [[id number]] Collected:  08/20/15 2035     Hemolysis Index 16 Updated:  08/20/15 2105    GFR [[id number]] Collected:  08/20/15 2035     EGFR >60.0 Updated:  08/20/15 2105    Glucose Whole Blood - POCT [[id number]]  (Abnormal)  Collected:  08/20/15 2028     POCT - Glucose Whole blood 321 (H) mg/dL Updated:  16/10/96 0454          Radiologic Studies -   Radiology Results (24 Hour)     ** No results found for the last 24 hours. **      .    Medical Decision Making   I am the first provider for this patient.    I reviewed the vital signs, available nursing notes, past medical history, past surgical history,  family history and social history.    Vital Signs-Reviewed the patient's vital signs.     Patient Vitals for the past 12 hrs:   BP Temp Pulse Resp   08/20/15 2025 129/62 mmHg 98.1 F (36.7 C) 97 18       Pulse Oximetry Analysis - Normal 99% on RA    Cardiac Monitor:  Rate: 97  Rhythm:  Normal Sinus Rhythm     EKG:  Interpreted by the EP.   Time Interpreted: 20:21   Rate: 93   Rhythm: Normal Sinus Rhythm    Interpretation: No ST or T wave changes   Comparison: Predominantly unchanged compared to prior on 08/09/15.     Old Medical Records: Nursing notes.     ED Course:   9:20 PM - Discussed blood work results with pt and counseled on diagnosis, f/u plans with [facility], medication use, and signs and symptoms when to return to ED.  Pt is stable and ready for discharge.       Provider Notes: [facility] CP w/hyperglycemia.  Given insulin and IVFs.  F/U Discharge Clinic.     Diagnosis     Clinical Impression:   1. Musculoskeletal chest pain    2. Hyperglycemia    3. Medically noncompliant        Treatment Plan:   ED Disposition     Discharge Dorothy [name] discharge to home/self care.    Condition at disposition: Stable              _______________________________      Attestations: This note is prepared by Ree Edman, acting as scribe for Ulice Bold, MD. The scribe's documentation has been prepared under my direction and personally reviewed by me in its entirety.  I confirm that the note above accurately reflects all work, treatment, procedures, and medical decision making performed by me.    _______________________________    Sharyne Richters, MD  08/21/15 [id number]
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
## 2015-08-20 NOTE — Discharge Instructions (Signed)
Blood Sugar Monitoring (Edu)     Why you were in the hospital…     Blood sugar testing is an important tool for improving your health. You have been diagnosed with a condition, or started on a medicine that makes your blood sugar run too high or too low.     There are several conditions that can cause abnormal blood sugar levels. These include diabetes, hypoglycemia (low sugar), liver disease, thyroid disease or other metabolic problems.      Certain medicines can also cause abnormal blood sugar levels. These include corticosteroids (prednisone), antipsychotic medicines and diabetes medicines.      Your doctor will tell you how often you should check your blood sugar. He or she will also tell you what your target blood sugar levels should be. The target level will be based on your age, your general health and any other medical problems you may have.     For most people, a general range of target blood sugars is as follows:  · Before a meal: 70-130 mg/dL (4-7 mmol/L).  · 1 to 2 hours after a meal: less than 180 mg/dL (10 mmol/L).   · Fasting more than 8 hours: 90-130 mg/dL (5-7 mmol/L).     Talk to your health care provider about what to do when your blood sugar levels are not in the normal range for your target goals.     How to test your blood sugar:  · Wash your hands with soap and water.  · Remove a test strip from the container. Put the cap back on the bottle.  · Insert the test strip into the meter.  · Prick your finger with the needle (lancet) provided with your test kit. Prick the side of your finger, rather than the tip. This causes less pain.  · Gently squeeze or massage your finger until a drop of blood forms.  · Touch the test strip to the blood. Avoid touching your skin.  · The meter will show your blood sugar level on the screen.  · Write down the level and bring a record of these measurements to appointments with your health care provider.  · Note: You may experience pain or bruising where you test.  You may have minor bleeding at your testing site. Hold firm pressure for 5 minutes over the site if you keep bleeding from a testing site.     Follow the instructions in the user manual for your meter. Meters vary based on brand and style.  · Change batteries in your meter as directed in the instruction manual.  · Use test strips designed for your meter. Not all strips and meters will work together.  · Store test strips as instructed.  · Do not use expired test strips.  · Clean the meter regularly.  · Run quality control tests as directed in the user manual.  · Check the manual if you have any other questions or problems with how the meter works.  · Bring the meter with you to your appointments so you can ask questions.     What you should do…     You may be scheduled for a follow-up appointment with your health care provider or a specialist. This is to ensure he or she can properly follow up and discuss your plan of care. Call your doctor as soon as possible if you can t keep your appointment.     Signs of low blood sugar include:  · Shakiness and general weakness.  · Sweating.  · Dizziness.  · Headache.  ·   Blurred vision.   Irritability.    Slurred (unclear) speech.   Drowsiness (feeling sleepy).   Confusion.   Fainting and/or seizures.    Heart palpitations (feeling your heart is beating abnormally).   Hunger.    If any of the above happens, do the following (if you can):   Test your blood sugar right away.    If your blood sugar is less than 70, eat a snack with some sugar. This can be a glass of orange juice or a piece of candy.    Then, eat a snack with some protein to help stabilize your blood sugar.   Recheck your sugar 15 minutes after your snack or sooner if your symptoms are not going away.     Call [facility] [phone]) if you feel confused, faint (weak), have slurred speech or keep having low blood sugars.     Signs of low blood sugar include:   Urinating (peeing)  often.   Greater thirst.   Dry mouth.   Blurred vision.   Fatigue (tiredness).   Nausea.     If any of the above happens, do the following:   Test your blood sugar if you are having these symptoms.    If your level is higher than your target range, this is called hyperglycemia. Very high blood sugar can also be very dangerous.    Talk with your health care provider about what to do when your blood sugars are above your target goals.    Call [facility] [phone]) if you feel confused, faint (weak) or you can t do the things necessary to take of your high blood sugar.    When you should contact your doctor.   If your blood sugar is below or above the targets discussed with your health care provider.   If you are having low blood sugars.   If you have loss of appetite, nausea (sick to stomach), vomiting or abdominal pain.   If you have sweet, fruity smelling breath (a sign of serious problems with high blood sugar).   If you think you have taken too much insulin or other diabetes medicine.   If you have signs of an infection.   If another doctor tells you to make changes in your medicines, vitamins, or supplements.   If you have been told you have kidney disease.   If you get sudden severe chest pain or have trouble breathing.   If you get a fever (temperature higher than 100.57F or 38C) or have chills.    When you should seek care at an Emergency Department.    YOU SHOULD SEEK MEDICAL ATTENTION IMMEDIATELY AT THE NEAREST EMERGENCY DEPARTMENT, IF ANY OF THE FOLLOWING HAPPENS:     You have major bleeding or bleeding that worries you.   You have any new or sudden warmth, redness, or swelling in the finger you use for testing.   You have trouble breathing or sudden shortness of breath.   You have chest pain, discomfort or tightness.   You have weakness, numbness or tingling in your arms or legs.   You have any of the above symptoms and can t reach your primary care doctor.               Hyperglycemia    During the visit today, your blood sugar was found to be high.    The medical term for high blood sugar is hyperglycemia. This may be a one-time event, but it  could mean you have diabetes. Untreated diabetes can lead to heart problems and kidney problems (including kidney failure). It can also lead to stroke and blindness. It is very important to follow up with your regular doctor to have your blood sugar re-checked.    Tell your regular doctor that your blood sugar was high today. Your doctor will want to recheck your blood. He or she may also want to order other lab tests. If the doctor finds that you have diabetes, you will need medicine and a special diet to control your blood sugar.    YOU SHOULD SEEK MEDICAL ATTENTION IMMEDIATELY, EITHER HERE OR AT THE NEAREST EMERGENCY DEPARTMENT, IF ANY OF THE FOLLOWING OCCURS:   Confusion or lethargy (very sleepy and hard to wake up).   Signs of dehydration. Signs include less urination (peeing), dry mouth, extreme fatigue (tiredness), lightheadedness or fainting.   Constant vomiting (throwing up).   Fever (temperature higher than 100.59F / 38C) or shaking chills.   Abdominal (belly) pain or vomiting (throwing up).              Musculoskeletal Chest Pain    You have been diagnosed with musculoskeletal chest pain.    Your pain is due to an injury or inflammation (swelling) of the muscles, ligaments, cartilage (soft bone), or bone in your chest. The pain is usually sharp and knife-like and becomes worse with twisting, bending, or moving. It commonly occurs in a small area, and can be irritated by pressing on it. There is usually no shortness of breath, lightheadedness, weakness, or sweaty feeling. Some children will have pain when taking a deep breath or when coughing. Exercise usually does not affect these symptoms.    Musculoskeletal chest pain is treated with anti-inflammatory medications like ibuprofen (Advil or Motrin) or naproxen  (Aleve). Other pain medications are usually not needed. Depending on the reason for your symptoms, either warm or cool compresses (damp washcloths laid on the skin) may be helpful.    Most musculoskeletal chest pain improves over several days.    You do not need to follow up with a doctor unless your symptoms get worse or fail to improve in the next few days.    YOU SHOULD SEEK MEDICAL ATTENTION IMMEDIATELY, EITHER HERE OR AT THE NEAREST EMERGENCY DEPARTMENT, IF ANY OF THE FOLLOWING OCCURS:   Your pain gets worse.   Your pain makes you feel short of breath, nauseated, or sweaty.   You notice that your pain gets worse as you walk, go up stairs, or exert yourself.   You have any weakness or lightheadedness with your pain.   Your pain makes breathing difficult.   You develop a swollen leg.   Your symptoms get worse or you have other concerns.

## 2015-08-20 NOTE — ED Notes (Signed)
Bed: GE95  Expected date:   Expected time:   Means of arrival: [id number] [place]  Comments:  CP

## 2015-08-21 LAB — ECG 12-LEAD
Atrial Rate: 93 {beats}/min
P Axis: 49 degrees
P-R Interval: 150 ms
Q-T Interval: 362 ms
QRS Duration: 94 ms
QTC Calculation (Bezet): 450 ms
R Axis: 7 degrees
T Axis: 24 degrees
Ventricular Rate: 93 {beats}/min

## 2015-08-28 ENCOUNTER — Emergency Department: Payer: Medicare Other

## 2015-08-28 ENCOUNTER — Emergency Department
Admission: EM | Admit: 2015-08-28 | Discharge: 2015-08-28 | Disposition: A | Discharge status: Home / Self Care | Payer: Medicare Other | Attending: Emergency Medicine | Admitting: Emergency Medicine

## 2015-08-28 DIAGNOSIS — Z7984 Long term (current) use of oral hypoglycemic drugs: Secondary | ICD-10-CM | POA: Insufficient documentation

## 2015-08-28 DIAGNOSIS — E1165 Type 2 diabetes mellitus with hyperglycemia: Secondary | ICD-10-CM | POA: Insufficient documentation

## 2015-08-28 DIAGNOSIS — Z794 Long term (current) use of insulin: Secondary | ICD-10-CM | POA: Insufficient documentation

## 2015-08-28 DIAGNOSIS — Z79899 Other long term (current) drug therapy: Secondary | ICD-10-CM | POA: Insufficient documentation

## 2015-08-28 DIAGNOSIS — R739 Hyperglycemia, unspecified: Secondary | ICD-10-CM

## 2015-08-28 DIAGNOSIS — M25552 Pain in left hip: Secondary | ICD-10-CM | POA: Insufficient documentation

## 2015-08-28 DIAGNOSIS — E7800 Pure hypercholesterolemia, unspecified: Secondary | ICD-10-CM | POA: Insufficient documentation

## 2015-08-28 LAB — COMPREHENSIVE METABOLIC PANEL
ALT: 9 U/L (ref 0–55)
AST (SGOT): 9 U/L (ref 5–34)
Albumin/Globulin Ratio: 0.9 (ref 0.9–2.2)
Albumin: 3.5 g/dL (ref 3.5–5.0)
Alkaline Phosphatase: 95 U/L (ref 37–106)
Anion Gap: 10 (ref 5.0–15.0)
BUN: 13 mg/dL (ref 7.0–19.0)
Bilirubin, Total: 0.3 mg/dL (ref 0.2–1.2)
CO2: 22 mEq/L (ref 22–29)
Calcium: 9.3 mg/dL (ref 8.5–10.5)
Chloride: 99 mEq/L — ABNORMAL LOW (ref 100–111)
Creatinine: 0.9 mg/dL (ref 0.6–1.0)
Globulin: 3.7 g/dL — ABNORMAL HIGH (ref 2.0–3.6)
Glucose: 392 mg/dL — ABNORMAL HIGH (ref 70–100)
Potassium: 4.1 mEq/L (ref 3.5–5.1)
Protein, Total: 7.2 g/dL (ref 6.0–8.3)
Sodium: 131 mEq/L — ABNORMAL LOW (ref 136–145)

## 2015-08-28 LAB — CBC AND DIFFERENTIAL
Basophils Absolute Automated: 0.02 10*3/uL (ref 0.00–0.20)
Basophils Automated: 0 %
Eosinophils Absolute Automated: 0.14 10*3/uL (ref 0.00–0.70)
Eosinophils Automated: 2 %
Hematocrit: 34.9 % — ABNORMAL LOW (ref 37.0–47.0)
Hgb: 11.8 g/dL — ABNORMAL LOW (ref 12.0–16.0)
Immature Granulocytes Absolute: 0.03 10*3/uL
Immature Granulocytes: 0 %
Lymphocytes Absolute Automated: 2.43 10*3/uL (ref 0.50–4.40)
Lymphocytes Automated: 30 %
MCH: 26.2 pg — ABNORMAL LOW (ref 28.0–32.0)
MCHC: 33.8 g/dL (ref 32.0–36.0)
MCV: 77.6 fL — ABNORMAL LOW (ref 80.0–100.0)
MPV: 9.5 fL (ref 9.4–12.3)
Monocytes Absolute Automated: 0.35 10*3/uL (ref 0.00–1.20)
Monocytes: 4 %
Neutrophils Absolute: 5.16 10*3/uL (ref 1.80–8.10)
Neutrophils: 64 %
Nucleated RBC: 0 /100 WBC (ref 0–1)
Platelets: 369 10*3/uL (ref 140–400)
RBC: 4.5 10*6/uL (ref 4.20–5.40)
RDW: 15 % (ref 12–15)
WBC: 8.1 10*3/uL (ref 3.50–10.80)

## 2015-08-28 LAB — URINALYSIS, REFLEX TO MICROSCOPIC EXAM IF INDICATED
Bilirubin, UA: NEGATIVE
Blood, UA: NEGATIVE
Glucose, UA: 500 — AB
Ketones UA: NEGATIVE
Leukocyte Esterase, UA: NEGATIVE
Nitrite, UA: NEGATIVE
Protein, UR: NEGATIVE
Specific Gravity UA: 1.024 (ref 1.001–1.035)
Urine pH: 7 (ref 5.0–8.0)
Urobilinogen, UA: NEGATIVE mg/dL

## 2015-08-28 LAB — GLUCOSE WHOLE BLOOD - POCT: Whole Blood Glucose POCT: 527 mg/dL (ref 70–100)

## 2015-08-28 LAB — HEMOLYSIS INDEX: Hemolysis Index: 5 (ref 0–18)

## 2015-08-28 LAB — GFR: EGFR: >60

## 2015-08-28 MED ORDER — SODIUM CHLORIDE 0.9 % IV BOLUS
1000.0000 mL | Freq: Once | INTRAVENOUS | Status: AC
Start: 2015-08-28 — End: 2015-08-28
  Administered 2015-08-28: 1000 mL via INTRAVENOUS

## 2015-08-28 MED ORDER — INSULIN GLARGINE 100 UNIT/ML SC SOLN
6.0000 [IU] | Freq: Once | SUBCUTANEOUS | Status: AC
Start: 2015-08-28 — End: 2015-08-28
  Administered 2015-08-28: 6 [IU] via SUBCUTANEOUS
  Filled 2015-08-28: qty 6

## 2015-08-28 MED ORDER — ACCU-CHEK FASTCLIX LANCETS MISC
Status: DC
Start: 2015-08-28 — End: 2015-09-02

## 2015-08-28 NOTE — ED Provider Notes (Signed)
EMERGENCY DEPARTMENT HISTORY AND PHYSICAL EXAM     Physician/Midlevel provider first contact with patient: 08/28/15 1503         Date: 08/28/2015  Patient Name: Dorothy [name]  Attending Physician: Louis Matte, MD  Advanced Practice Provider: Zandra Abts    History of Presenting Illness       History Provided By: Patient    Chief Complaint:  Chief Complaint   Patient presents with   . Foot Pain     Onset: 1 day  Timing: Constant  Location: left inner thigh and left lateral hip  Quality: sharp  Severity: Moderate/Severe  Exacerbating factors: direct touch, movement  Alleviating factors: rest  Associated Symptoms: none  Pertinent Negatives: Denies changes in bowel or bladder habits, saddle anesthesia, chest pain, SOB, abd pain, n/v, vertebral tenderness.    Additional History: Dorothy [name] is a 40 y.o. female with h/o diabetes presenting to the ED with atraumatic left inner thigh and hip pain x 1 day. Pain began suddenly last night with no obvious triggering event. Patient was seen at [facility] last Tuesday for c/o L foot pain, where she was given a splint and told to follow-up with a podiatrist (not yet done). Tried ibuprofen with no relief. Denies chest pain, SOB, abdominal pain, nausea, vomiting, tenderness at cervical or lumbar spine, or changes to bowel and bladder habits. Allergies significant for morphine and Percocet.    PCP: Pcp, Noneorunknown, MD  SPECIALISTS:    No current facility-administered medications for this encounter.     Current Outpatient Prescriptions   Medication Sig Dispense Refill   . insulin glargine (LANTUS) 100 UNIT/ML injection Inject 45 Units into the skin nightly.     . metFORMIN (GLUCOPHAGE) 500 MG tablet Take 500 mg by mouth 2 (two) times daily with meals.     Marland Kitchen ACCU-CHEK FASTCLIX LANCETS Misc Check blood sugar 7 times daily, as directed by MD. 204 each 11   . ATORVASTATIN CALCIUM PO Take by mouth.     . [employer] LANCETS MICRO THIN 33G Misc Check blood sugar bid 100 each 0    . FREESTYLE TEST STRIPS test strip Check blood sugar twice a day. 100 each 0   . GABAPENTIN PO Take by mouth.     Marland Kitchen glucose monitoring kit (FREESTYLE) monitoring kit use as instructed 1 each 0   . ibuprofen (ADVIL,MOTRIN) 600 MG tablet Take 1 tablet (600 mg total) by mouth every 6 (six) hours as needed for Pain or Fever. 30 tablet 0   . insulin aspart (NOVOLOG) 100 UNIT/ML injection Inject 3 Units into the skin 3 (three) times daily before meals. 10 mL 0   . insulin glargine (LANTUS) 100 UNIT/ML injection Inject 45 Units into the skin nightly. 10 mL 0   . ketoconazole (NIZORAL) 2 % cream Apply topically 2 (two) times daily. Apply to rash left gorin 30 g 1   . Lancet Device Misc Dispense one 1 each 0   . Lancets (FREESTYLE) lancets Use as instructed 100 each 0   . liraglutide (VICTOZA) 18 MG/3ML Solution Pen-injector      . lisinopril (PRINIVIL,ZESTRIL) 20 MG tablet Take 20 mg by mouth daily.     . metFORMIN (GLUCOPHAGE) 500 MG tablet Take 1 tablet (500 mg total) by mouth 2 (two) times daily with meals. 14 tablet 0   . VITAMIN D, ERGOCALCIFEROL, PO Take by mouth.         Past History     Past  Medical History:  Past Medical History   Diagnosis Date   . Diabetes mellitus without complication    . Asthma without status asthmaticus    . Anemia    . Depression    . Anemia    . Panic attack    . Seasonal allergies    . High cholesterol        Past Surgical History:  Past Surgical History   Procedure Laterality Date   . Cesarean section     . Cholecystectomy     . Tonsillectomy     . Hernia repair         Family History:  Family History   Problem Relation Age of Onset   . Diabetes Mother    . Diabetes Sister    . Diabetes Maternal Aunt    . Hypertension Paternal Grandmother    . No known problems Daughter    . No known problems Son    . No known problems Daughter    . No known problems Son        Social History:  Social History   Substance Use Topics   . Smoking status: Never Smoker    . Smokeless tobacco: None   .  Alcohol Use: No       Allergies:  Allergies   Allergen Reactions   . Latex    . Morphine Itching     Generalized bumps with itchiness   . Percocet [Oxycodone-Acetaminophen]      Unknown reaction     . Tape        Review of Systems     Review of Systems   Constitutional: Negative for fever, chills and weight loss.   HENT: Negative for congestion.    Eyes: Negative for discharge and redness.   Respiratory: Negative for cough, shortness of breath and wheezing.    Cardiovascular: Negative for chest pain.   Gastrointestinal: Negative for nausea, vomiting, abdominal pain, diarrhea and constipation.   Genitourinary: Negative for dysuria, urgency, frequency and flank pain.   Musculoskeletal: Negative for neck pain.        Left hip pain. Left inner thigh pain.   Skin: Negative for itching and rash.   Neurological: Negative for dizziness, tingling, tremors, speech change, seizures, loss of consciousness and headaches.   Endo/Heme/Allergies: Negative for environmental allergies. Does not bruise/bleed easily.   Psychiatric/Behavioral: Negative for depression.       Physical Exam     Filed Vitals:    08/28/15 1500 08/28/15 1620 08/28/15 1756 08/28/15 1952   BP: 120/77 130/66 112/63 138/86   Pulse: 86 87 82 94   Temp: 97.6 F (36.4 C) 98.5 F (36.9 C)     TempSrc: Oral Oral     Resp: 18 18 17 18    Height:  5\' 11"  (1.803 m)     Weight:  138.347 kg     SpO2: 99% 99% 99% 98%       Constitutional: Patient is oriented to person, place, and time and well-developed, well-nourished, and in no distress.   Head: Normocephalic and atraumatic.   Eyes: EOM are normal. Pupils are equal, round, and reactive to light.   Neck: Normal range of motion. Neck supple. No anterior cervical LNs.  Cardiovascular: Normal rate and regular rhythm.   Pulmonary/Chest: Effort normal and breath sounds normal. No respiratory distress.   Abdominal: Soft. There is no tenderness. Bowel sounds present and normal. No CVA tenderness.   Musculoskeletal: Pain with hip  flexion  and extension, unable to . No midline vertebral tenderness. Pain elicited on palpation at L lateral hip. Splint intact on left foot. No edema LLE.  Neurological: Patient is alert and oriented to person, place, and time. GCS score is 15.  Skin/PV: Skin is warm and dry. No overlying skin changes at left lateral hip or left foot. No ulcers. Pedal pulses equal and intact bilat. Capillary refill <3 secc    Diagnostic Study Results     Labs -     Results     Procedure Component Value Units Date/Time    Comprehensive Metabolic Panel (CMP) [[id number]]  (Abnormal) Collected:  08/28/15 1748    Specimen Information:  Blood Updated:  08/28/15 1815     Glucose 392 (H) mg/dL      BUN 62.1 mg/dL      Creatinine 0.9 mg/dL      Sodium 308 (L) mEq/L      Potassium 4.1 mEq/L      Chloride 99 (L) mEq/L      CO2 22 mEq/L      Calcium 9.3 mg/dL      Protein, Total 7.2 g/dL      Albumin 3.5 g/dL      AST (SGOT) 9 U/L      ALT 9 U/L      Alkaline Phosphatase 95 U/L      Bilirubin, Total 0.3 mg/dL      Globulin 3.7 (H) g/dL      Albumin/Globulin Ratio 0.9      Anion Gap 10.0     Hemolysis index [[id number]] Collected:  08/28/15 1748     Hemolysis Index 5 Updated:  08/28/15 1815    GFR [[id number]] Collected:  08/28/15 1748     EGFR >60.0 Updated:  08/28/15 1815    CBC with differential [[id number]]  (Abnormal) Collected:  08/28/15 1748    Specimen Information:  Blood from Blood Updated:  08/28/15 1800     WBC 8.10 x10 3/uL      Hgb 11.8 (L) g/dL      Hematocrit 66.4 (L) %      Platelets 369 x10 3/uL      RBC 4.50 x10 6/uL      MCV 77.6 (L) fL      MCH 26.2 (L) pg      MCHC 33.8 g/dL      RDW 15 %      MPV 9.5 fL      Neutrophils 64 %      Lymphocytes Automated 30 %      Monocytes 4 %      Eosinophils Automated 2 %      Basophils Automated 0 %      Immature Granulocyte 0 %      Nucleated RBC 0 /100 WBC      Neutrophils Absolute 5.16 x10 3/uL      Abs Lymph Automated 2.43 x10 3/uL      Abs Mono Automated 0.35 x10 3/uL      Abs Eos  Automated 0.14 x10 3/uL      Absolute Baso Automated 0.02 x10 3/uL      Absolute Immature Granulocyte 0.03 x10 3/uL     UA, Reflex to Microscopic [[id number]]  (Abnormal) Collected:  08/28/15 1637    Specimen Information:  Urine Updated:  08/28/15 1701     Urine Type Clean Catch      Color, UA Straw      Clarity, UA Clear  Specific Gravity UA 1.024      Urine pH 7.0      Leukocyte Esterase, UA Negative      Nitrite, UA Negative      Protein, UR Negative      Glucose, UA 500 (A)      Ketones UA Negative      Urobilinogen, UA Negative mg/dL      Bilirubin, UA Negative      Blood, UA Negative      Squamous Epithelial Cells, Urine 0 - 5 /hpf     Glucose Whole Blood - POCT [[id number]]  (Abnormal) Collected:  08/28/15 1538     POCT - Glucose Whole blood 527 (HH) mg/dL Updated:  40/98/11 9147          Radiologic Studies -   Radiology Results (24 Hour)     Procedure Component Value Units Date/Time    US Venous Dopp Left Low Extrem [[id number]] Collected:  08/28/15 1934    Order Status:  Completed Updated:  08/28/15 1938    Narrative:      EXAMINATION: Venous Duplex Ultrasound Left Lower Extremity    HISTORY:  Left lower extremity pain. Clinical suspicion exists for  venous thromboembolic disease.    TECHNIQUE:  Duplex evaluation of the veins of the left lower extremity  is performed from the lower pelvis to the ankle with gray scale imaging,  transverse compression and gated and color Doppler techniques.  In  addition, evaluation of the contralateral iliofemoral veins is  performed.    INTERPRETATION:  Examination of the venous system of the left lower  extremity demonstrates no evidence of intraluminal thrombus or  obstruction to venous flow.  Normal phasicity is present at the  iliofemoral junctions indicating no central obstruction.  There is  normal coaptation of the femoropopliteal veins throughout their course  with transverse compression.  The saphenofemoral junction is also noted  to be widely patent.  Deep and  superficial veins of the left calf are  also demonstrated to be patent without thrombus or obstruction.      Impression:         Normal venous duplex of the left lower extremity.  No  evidence of intraluminal thrombus or obstruction to venous flow in the  left lower extremity.    Alphonzo Grieve, MD   08/28/2015 7:34 PM      Hip Left AP and Lateral with Pelvis [[id number]] Collected:  08/28/15 1619    Order Status:  Completed Updated:  08/28/15 1624    Narrative:      XR HIP LEFT 1 VW WITH PELVIS    CLINICAL INDICATION:   hip pain    COMPARISON: None      Impression:      FINDINGS and       There is no evidence for fracture or dislocation of the left hip.    Sandie Ano, MD   08/28/2015 4:20 PM        .    Medical Decision Making   I am the first provider for this patient.    I reviewed the vital signs, available nursing notes, past medical history, past surgical history, family history and social history.    Vital Signs-Reviewed the patient's vital signs.     Patient Vitals for the past 12 hrs:   BP Temp Pulse Resp   08/28/15 1952 138/86 mmHg - 94 18   08/28/15 1756 112/63 mmHg - 82 17   08/28/15 1620 130/66  mmHg 98.5 F (36.9 C) 87 18   08/28/15 1500 120/77 mmHg 97.6 F (36.4 C) 86 18       Pulse Oximetry Analysis - Normal 99% on RA    Old Medical Records: Old medical records.     ED Course:   3:28PM - Pt resting comfortably on gurney in no acute distress at this time. Pt is atraumatic but given previous foot injury and complaints of hip pain and decreased ROM, will Xray to rule out fracture or dislocation. Given multiple medical illnesses and unclear medication compliance, will recommend check glucose for now.  3:40PM - Glucose elevated at 527. Dr. Daphine Deutscher consulted. Will check CBC, CMP, UA. Start fluids. Left leg doppler to rule out DVT.   7:00PM - Lantus 6 units and recheck glucose. DVT and Xray negative. Pt complains of 9/10 pain but is resting comfortably and does not appear to be in any distress. Options for  pain management discussed at length. Pt requesting Dilaudid but given clinical presentation recommend other options. Ibuprofen offered but pt declines. Will refer to pain specialists--resources given and explained at length. Condition and treatment plan reviewed with pt at length and all questions answered. Concerned about long term follow-up given inconsistent compliance with medications and lack of primary care management, so refer to [facility] for ongoing monitoring. Pt reported that she has ample medications at home and no refills needed at this time. Strongly encouraged pt to take medications as prescribed, follow-up regularly with primary care, and check glucose levels regularly. Reviewed complications of uncontrolled diabetes, including but not limited to DKA, coma, blindness, and renal failure. Pt verbalized understanding.   7:30PM - Pt stable, in no acute distress, and amenable to discharge. Bus tokens given and directions to [employer] reviewed.    Provider Notes:   40yo F with history of uncontrolled insulin-dependent Type II DM presenting with left hip and inner thigh pain x1 day. Imaging negative for fracture, dislocation, and DVT with labs significant for hyperglycemia. Findings most consistent with musculoskeletal pain and hyperglycemia secondary to medication noncompliance. Strongly encouraged pt to establish regular follow-up with primary care and pain specialists for ongoing maintenance.    D/w Louis Matte, MD      Diagnosis     Clinical Impression:   1. Hyperglycemia    2. Pain of left hip joint        Treatment Plan:   ED Disposition     Discharge Dorothy [name] discharge to home/self care.    Condition at disposition: Stable              _______________________________    CHART OWNERSHIPLevora Angel, PA-C, am the primary clinician of record.  _______________________________        Zandra Abts, PA  09/01/15 1325    Louis Matte, MD  09/01/15 [phone]

## 2015-08-28 NOTE — ED Notes (Signed)
Patient presents to the ED with left inner thigh into left hip since last night. [facility] and was advised she had multiple bone spurs left foot. Needs to see a podiatrist.

## 2015-08-28 NOTE — ED Notes (Signed)
Pt brought to ER by EMS for lt leg worsening pain. Admitting VSS. Pt found to be hyperglycemic in the ED with BG >500. Pt is A&Ox4. Ambulatory pt. Pt denies chest pain, SOB, or any other discomfort.

## 2015-08-28 NOTE — Discharge Instructions (Signed)
Blood Sugar Monitoring (Edu)     Why you were in the hospital…     Blood sugar testing is an important tool for improving your health. You have been diagnosed with a condition, or started on a medicine that makes your blood sugar run too high or too low.     There are several conditions that can cause abnormal blood sugar levels. These include diabetes, hypoglycemia (low sugar), liver disease, thyroid disease or other metabolic problems.      Certain medicines can also cause abnormal blood sugar levels. These include corticosteroids (prednisone), antipsychotic medicines and diabetes medicines.      Your doctor will tell you how often you should check your blood sugar. He or she will also tell you what your target blood sugar levels should be. The target level will be based on your age, your general health and any other medical problems you may have.     For most people, a general range of target blood sugars is as follows:  · Before a meal: 70-130 mg/dL (4-7 mmol/L).  · 1 to 2 hours after a meal: less than 180 mg/dL (10 mmol/L).   · Fasting more than 8 hours: 90-130 mg/dL (5-7 mmol/L).     Talk to your health care provider about what to do when your blood sugar levels are not in the normal range for your target goals.     How to test your blood sugar:  · Wash your hands with soap and water.  · Remove a test strip from the container. Put the cap back on the bottle.  · Insert the test strip into the meter.  · Prick your finger with the needle (lancet) provided with your test kit. Prick the side of your finger, rather than the tip. This causes less pain.  · Gently squeeze or massage your finger until a drop of blood forms.  · Touch the test strip to the blood. Avoid touching your skin.  · The meter will show your blood sugar level on the screen.  · Write down the level and bring a record of these measurements to appointments with your health care provider.  · Note: You may experience pain or bruising where you test.  You may have minor bleeding at your testing site. Hold firm pressure for 5 minutes over the site if you keep bleeding from a testing site.     Follow the instructions in the user manual for your meter. Meters vary based on brand and style.  · Change batteries in your meter as directed in the instruction manual.  · Use test strips designed for your meter. Not all strips and meters will work together.  · Store test strips as instructed.  · Do not use expired test strips.  · Clean the meter regularly.  · Run quality control tests as directed in the user manual.  · Check the manual if you have any other questions or problems with how the meter works.  · Bring the meter with you to your appointments so you can ask questions.     What you should do…     You may be scheduled for a follow-up appointment with your health care provider or a specialist. This is to ensure he or she can properly follow up and discuss your plan of care. Call your doctor as soon as possible if you can t keep your appointment.     Signs of low blood sugar include:  · Shakiness and general weakness.  · Sweating.  · Dizziness.  · Headache.  ·   Blurred vision.  · Irritability.   · Slurred (unclear) speech.  · Drowsiness (feeling sleepy).  · Confusion.  · Fainting and/or seizures.   · Heart palpitations (feeling your heart is beating abnormally).  · Hunger.     If any of the above happens, do the following (if you can):  · Test your blood sugar right away.   · If your blood sugar is less than 70, eat a snack with some sugar. This can be a glass of orange juice or a piece of candy.   · Then, eat a snack with some protein to help stabilize your blood sugar.  · Recheck your sugar 15 minutes after your snack or sooner if your symptoms are not going away.      Call Emergency Medical Services (911) if you feel confused, faint (weak), have slurred speech or keep having low blood sugars.      Signs of low blood sugar include:  · Urinating (peeing)  often.  · Greater thirst.  · Dry mouth.  · Blurred vision.  · Fatigue (tiredness).  · Nausea.      If any of the above happens, do the following:  · Test your blood sugar if you are having these symptoms.   · If your level is higher than your target range, this is called hyperglycemia. Very high blood sugar can also be very dangerous.   · Talk with your health care provider about what to do when your blood sugars are above your target goals.   · Call Emergency Medical Services (911) if you feel confused, faint (weak) or you can t do the things necessary to take of your high blood sugar.     When you should contact your doctor…  · If your blood sugar is below or above the targets discussed with your health care provider.  · If you are having low blood sugars.  · If you have loss of appetite, nausea (sick to stomach), vomiting or abdominal pain.  · If you have sweet, fruity smelling breath (a sign of serious problems with high blood sugar).  · If you think you have taken too much insulin or other diabetes medicine.  · If you have signs of an infection.  · If another doctor tells you to make changes in your medicines, vitamins, or supplements.  · If you have been told you have kidney disease.  · If you get sudden severe chest pain or have trouble breathing.  · If you get a fever (temperature higher than 100.4°F or 38°C) or have chills.     When you should seek care at an Emergency Department…     YOU SHOULD SEEK MEDICAL ATTENTION IMMEDIATELY AT THE NEAREST EMERGENCY DEPARTMENT, IF ANY OF THE FOLLOWING HAPPENS:     · You have major bleeding or bleeding that worries you.  · You have any new or sudden warmth, redness, or swelling in the finger you use for testing.  · You have trouble breathing or sudden shortness of breath.  · You have chest pain, discomfort or tightness.  · You have weakness, numbness or tingling in your arms or legs.  · You have any of the above symptoms and can t reach your primary care doctor.

## 2015-08-28 NOTE — ED Notes (Signed)
Bed: EX26  Expected date:   Expected time:   Means of arrival:   Comments:  [facility] 410

## 2015-09-01 ENCOUNTER — Telehealth: Payer: Self-pay | Admitting: Physician Assistant

## 2015-09-02 ENCOUNTER — Emergency Department: Payer: Medicare Other

## 2015-09-02 ENCOUNTER — Emergency Department
Admission: EM | Admit: 2015-09-02 | Discharge: 2015-09-02 | Disposition: A | Discharge status: Home / Self Care | Payer: Medicare Other | Attending: Emergency Medicine | Admitting: Emergency Medicine

## 2015-09-02 DIAGNOSIS — E1165 Type 2 diabetes mellitus with hyperglycemia: Secondary | ICD-10-CM | POA: Insufficient documentation

## 2015-09-02 DIAGNOSIS — E7800 Pure hypercholesterolemia, unspecified: Secondary | ICD-10-CM | POA: Insufficient documentation

## 2015-09-02 DIAGNOSIS — J45909 Unspecified asthma, uncomplicated: Secondary | ICD-10-CM | POA: Insufficient documentation

## 2015-09-02 DIAGNOSIS — Z794 Long term (current) use of insulin: Secondary | ICD-10-CM | POA: Insufficient documentation

## 2015-09-02 DIAGNOSIS — Z79899 Other long term (current) drug therapy: Secondary | ICD-10-CM | POA: Insufficient documentation

## 2015-09-02 DIAGNOSIS — Z7984 Long term (current) use of oral hypoglycemic drugs: Secondary | ICD-10-CM | POA: Insufficient documentation

## 2015-09-02 DIAGNOSIS — R739 Hyperglycemia, unspecified: Secondary | ICD-10-CM

## 2015-09-02 DIAGNOSIS — R0789 Other chest pain: Secondary | ICD-10-CM | POA: Insufficient documentation

## 2015-09-02 LAB — HEMOLYSIS INDEX
Hemolysis Index: 6 (ref 0–18)
Hemolysis Index: 8 (ref 0–18)
Hemolysis Index: 12 (ref 0–18)

## 2015-09-02 LAB — CBC AND DIFFERENTIAL
Basophils Absolute Automated: 0.01 10*3/uL (ref 0.00–0.20)
Basophils Automated: 0 %
Eosinophils Absolute Automated: 0.16 10*3/uL (ref 0.00–0.70)
Eosinophils Automated: 2 %
Hematocrit: 34.1 % — ABNORMAL LOW (ref 37.0–47.0)
Hgb: 11.8 g/dL — ABNORMAL LOW (ref 12.0–16.0)
Immature Granulocytes Absolute: 0.02 10*3/uL
Immature Granulocytes: 0 %
Lymphocytes Absolute Automated: 2.19 10*3/uL (ref 0.50–4.40)
Lymphocytes Automated: 28 %
MCH: 26.8 pg — ABNORMAL LOW (ref 28.0–32.0)
MCHC: 34.6 g/dL (ref 32.0–36.0)
MCV: 77.5 fL — ABNORMAL LOW (ref 80.0–100.0)
MPV: 9.9 fL (ref 9.4–12.3)
Monocytes Absolute Automated: 0.37 10*3/uL (ref 0.00–1.20)
Monocytes: 5 %
Neutrophils Absolute: 5.2 10*3/uL (ref 1.80–8.10)
Neutrophils: 66 %
Nucleated RBC: 0 /100 WBC (ref 0–1)
Platelets: 320 10*3/uL (ref 140–400)
RBC: 4.4 10*6/uL (ref 4.20–5.40)
RDW: 15 % (ref 12–15)
WBC: 7.93 10*3/uL (ref 3.50–10.80)

## 2015-09-02 LAB — BASIC METABOLIC PANEL
Anion Gap: 10 (ref 5.0–15.0)
BUN: 11 mg/dL (ref 7.0–19.0)
CO2: 20 mEq/L — ABNORMAL LOW (ref 22–29)
Calcium: 8.9 mg/dL (ref 8.5–10.5)
Chloride: 103 mEq/L (ref 100–111)
Creatinine: 0.8 mg/dL (ref 0.6–1.0)
Glucose: 335 mg/dL — ABNORMAL HIGH (ref 70–100)
Potassium: 4 mEq/L (ref 3.5–5.1)
Sodium: 133 mEq/L — ABNORMAL LOW (ref 136–145)

## 2015-09-02 LAB — COMPREHENSIVE METABOLIC PANEL
ALT: 10 U/L (ref 0–55)
AST (SGOT): 10 U/L (ref 5–34)
Albumin/Globulin Ratio: 0.8 — ABNORMAL LOW (ref 0.9–2.2)
Albumin: 3.3 g/dL — ABNORMAL LOW (ref 3.5–5.0)
Alkaline Phosphatase: 101 U/L (ref 37–106)
Anion Gap: 11 (ref 5.0–15.0)
BUN: 14 mg/dL (ref 7.0–19.0)
Bilirubin, Total: 0.2 mg/dL (ref 0.2–1.2)
CO2: 20 mEq/L — ABNORMAL LOW (ref 22–29)
Calcium: 9.3 mg/dL (ref 8.5–10.5)
Chloride: 96 mEq/L — ABNORMAL LOW (ref 100–111)
Creatinine: 1 mg/dL (ref 0.6–1.0)
Globulin: 4.1 g/dL — ABNORMAL HIGH (ref 2.0–3.6)
Glucose: 628 mg/dL (ref 70–100)
Potassium: 4.3 mEq/L (ref 3.5–5.1)
Protein, Total: 7.4 g/dL (ref 6.0–8.3)
Sodium: 127 mEq/L — ABNORMAL LOW (ref 136–145)

## 2015-09-02 LAB — BLOOD GAS, VENOUS
Base Excess, Ven: -1.8 mEq/L
HCO3, Ven: 21.9 mEq/L
O2 Sat, Venous: 96.6 %
Temperature: 37
Venous Total CO2: 19.6 mEq/L
pCO2, Venous: 35.4 mmhg
pH, Ven: 7.408
pO2, Venous: 81.6 mmhg

## 2015-09-02 LAB — GFR
EGFR: >60
EGFR: >60

## 2015-09-02 LAB — TROPONIN I
Troponin I: <0.01 ng/mL (ref 0.00–0.09)
Troponin I: <0.01 ng/mL (ref 0.00–0.09)

## 2015-09-02 LAB — GLUCOSE WHOLE BLOOD - POCT
Whole Blood Glucose POCT: 591 mg/dL (ref 70–100)
Whole Blood Glucose POCT: 343 mg/dL — ABNORMAL HIGH (ref 70–100)

## 2015-09-02 LAB — PHOSPHORUS: Phosphorus: 3.2 mg/dL (ref 2.3–4.7)

## 2015-09-02 LAB — MAGNESIUM: Magnesium: 1.7 mg/dL (ref 1.6–2.6)

## 2015-09-02 MED ORDER — INSULIN GLARGINE 100 UNIT/ML SC SOLN
45.0000 [IU] | Freq: Every evening | SUBCUTANEOUS | Status: AC
Start: 2015-09-02 — End: ?

## 2015-09-02 MED ORDER — METFORMIN HCL 500 MG PO TABS
500.0000 mg | ORAL_TABLET | Freq: Two times a day (BID) | ORAL | Status: DC
Start: 2015-09-02 — End: 2015-09-18

## 2015-09-02 MED ORDER — FREESTYLE TEST VI STRP
ORAL_STRIP | Status: AC
Start: 2015-09-02 — End: ?

## 2015-09-02 MED ORDER — FREESTYLE SYSTEM KIT
PACK | Status: AC
Start: 2015-09-02 — End: ?

## 2015-09-02 MED ORDER — INSULIN ASPART 100 UNIT/ML SC SOLN
3.0000 [IU] | Freq: Three times a day (TID) | SUBCUTANEOUS | Status: DC
Start: 2015-09-02 — End: 2015-09-02

## 2015-09-02 MED ORDER — INSULIN REGULAR HUMAN 100 UNIT/ML IJ SOLN
10.0000 [IU] | Freq: Once | INTRAMUSCULAR | Status: AC
Start: 2015-09-02 — End: 2015-09-02
  Administered 2015-09-02: 10 [IU] via INTRAVENOUS
  Filled 2015-09-02: qty 30

## 2015-09-02 MED ORDER — INSULIN GLARGINE 100 UNIT/ML SC SOLN
45.0000 [IU] | Freq: Every evening | SUBCUTANEOUS | Status: DC
Start: 2015-09-02 — End: 2015-09-02

## 2015-09-02 MED ORDER — METFORMIN HCL 500 MG PO TABS
500.0000 mg | ORAL_TABLET | Freq: Two times a day (BID) | ORAL | Status: DC
Start: 2015-09-02 — End: 2015-09-02

## 2015-09-02 MED ORDER — SODIUM CHLORIDE 0.9 % IV BOLUS
1000.0000 mL | Freq: Once | INTRAVENOUS | Status: AC
Start: 2015-09-02 — End: 2015-09-02
  Administered 2015-09-02: 1000 mL via INTRAVENOUS

## 2015-09-02 MED ORDER — FREESTYLE TEST VI STRP
ORAL_STRIP | Status: DC
Start: 2015-09-02 — End: 2015-09-02

## 2015-09-02 MED ORDER — INSULIN GLARGINE 100 UNIT/ML SC SOLN
45.0000 [IU] | Freq: Once | SUBCUTANEOUS | Status: AC
Start: 2015-09-02 — End: 2015-09-02
  Administered 2015-09-02: 45 [IU] via SUBCUTANEOUS
  Filled 2015-09-02: qty 45

## 2015-09-02 MED ORDER — FREESTYLE SYSTEM KIT
PACK | Status: DC
Start: 2015-09-02 — End: 2015-09-02

## 2015-09-02 MED ORDER — INSULIN ASPART 100 UNIT/ML SC SOLN
3.0000 [IU] | Freq: Three times a day (TID) | SUBCUTANEOUS | Status: AC
Start: 2015-09-02 — End: ?

## 2015-09-02 NOTE — ED Notes (Signed)
Dorothy [name] is a 40 y.o. female Patient presents with Chief Complaint of substernal Chest Pain x approx . Pt sts the pain is non radiating and describes it as sharp. Pain is reproducible with palpation. Per EMS the pt's Dexi just read "high".     Blood pressure 140/79, pulse 86, temperature 97.7 F (36.5 C), temperature source Oral, resp. rate 14, last menstrual period 08/06/2015, SpO2 97 %.

## 2015-09-02 NOTE — Discharge Instructions (Signed)
Chest Pain of Unclear Etiology  You have been seen for chest pain. The cause of your pain is not yet known.  Your doctor has learned about your medical history, examined you, and checked any tests that were done. Still, it is unclear why you are having pain. The doctor thinks there is only a very small chance that your pain is caused by a life-threatening condition. Later, your primary care doctor might do more tests or check you again.  Sometimes chest pain is caused by a dangerous condition, like a heart attack, aorta injury, blood clot in the lung, or collapsed lung. It is unlikely that your pain is caused by a life-threatening condition if: Your chest pain lasts only a few seconds at a time; you are not short of breath, nauseated (sick to your stomach), sweaty, or lightheaded; your pain gets worse when you twist or bend; your pain improves with exercise or hard work.  Chest pain is serious. It is VERY IMPORTANT that you follow up with your regular doctor and seek medical attention immediately here or at the nearest Emergency Department if your symptoms become worse or they change.  YOU SHOULD SEEK MEDICAL ATTENTION IMMEDIATELY, EITHER HERE OR AT THE NEAREST EMERGENCY DEPARTMENT, IF ANY OF THE FOLLOWING OCCURS:  Your pain gets worse.   Your pain makes you short of breath, nauseated, or sweaty.   Your pain gets worse when you walk, go up stairs, or exert yourself.   You feel weak, lightheaded, or faint.   It hurts to breathe.   Your leg swells.   Your symptoms get worse or you have new symptoms or concerns.    Hyperglycemia  During the visit today, your blood sugar was found to be high.  The medical term for high blood sugar is hyperglycemia. This may be a one-time event, but it could mean you have diabetes. Untreated diabetes can lead to heart problems and kidney problems (including kidney failure). It can also lead to stroke and blindness. It is very important to follow up with your regular doctor to have your  blood sugar re-checked.  Tell your regular doctor that your blood sugar was high today. Your doctor will want to recheck your blood. He or she may also want to order other lab tests. If the doctor finds that you have diabetes, you will need medicine and a special diet to control your blood sugar.  YOU SHOULD SEEK MEDICAL ATTENTION IMMEDIATELY, EITHER HERE OR AT THE NEAREST EMERGENCY DEPARTMENT, IF ANY OF THE FOLLOWING OCCURS:  Confusion or lethargy (very sleepy and hard to wake up).   Signs of dehydration. Signs include less urination (peeing), dry mouth, extreme fatigue (tiredness), lightheadedness or fainting.   Constant vomiting (throwing up).   Fever (temperature higher than 100.4F / 38C) or shaking chills.   Abdominal (belly) pain or vomiting (throwing up).    Diabetic Diet (Edu)  You have been given information about diet guidelines for diabetes control. Use these only as general dietary guidelines. You should also get formal diabetes instruction from a specialist.  Diabetes is when your body cannot make insulin or use it properly. Insulin is a hormone that controls your blood sugar. High blood sugars can be very serious. Over time, they can lead to other serious health problems. These include vision loss, sensation loss in the feet, heart disease, stroke, and kidney problems.   Eating healthy helps to lower your blood sugar. It is one of the most important steps in managing diabetes.     A diabetes expert can help you manage your diabetes. This could be your doctor or endocrinologist (a doctor who specializes in the body s hormone system). It could also be a registered dietician or nutritionist. They can give you flexible meal plans to go with your lifestyle and other health needs.   A healthy diabetic diet includes a balance of foods. Use the food pyramid for reference. You can keep your blood sugars steady by:  Limiting sweet foods and fats.   Being careful about how many carbohydrates you eat, and when you  eat them.   Eating a lot of whole grain foods, fruits and vegetables.  You may be taking medicine for your diabetes. If so, it is very important to follow a schedule for your meals and snacks.  Here are some general guidelines:  Fats and sweets are not as nutritious or healthy as other foods. Fats have a lot of calories. Sweets can be high in carbohydrates and fats. Some sweet foods with a lot of fat and calories are ice cream, pie, syrup, cookies, doughnuts and cake. Other examples of fats are salad dressing, oil, cream cheese, butter, mayonnaise, olives and bacon. You do not have to get rid of these foods completely. Instead, try sugar-free popsicles or cocoa mix, fat-free ice cream or frozen yogurt or diet soda.   Other carbohydrates are bread, grains, cereal, pasta, and some vegetables like corn and potatoes. These foods give you vitamins, minerals and fiber. Whole grain starches are healthier. They have more of the vitamins, minerals and fiber your body needs. You should eat starches at every meal.   Meat and meat substitutes give your body the protein it needs. The meat and meat substitutes group includes chicken, beef, fish, pork, ham, lamb, turkey, eggs, cheese and tofu. Eat small amounts of these foods every day. Buy cuts of meat that have just a small amount of fat. Trim off any extra fat. Eat chicken or turkey without the skin. Cook the meat in low-fat ways. This includes broiling, grilling, roasting and steaming. Try vinegar, lemon juice, soy sauce, salsa, ketchup, herbs and spices for extra flavor.  Measure your food. This helps control your meal portions or serving sizes. This will help you maintain a good body weight. It will also help with a steady blood sugar level. Use measuring cups or spoons to make sure your servings are the right size. You can also use a food scale. You can find all nutrition facts and serving sizes on food package labels. Talk to your nutritionist about portions of each of the  food groups.   For more information, talk to your doctor, nurse or nutritionist. You can also contact your local American Diabetes Association. www.diabetes.org

## 2015-09-02 NOTE — ED Notes (Signed)
Pt refused chest xray. MD aware.

## 2015-09-02 NOTE — ED Provider Notes (Signed)
EMERGENCY DEPARTMENT HISTORY AND PHYSICAL EXAM     Physician/Midlevel provider first contact with patient: 09/02/15 1739         Date: 09/02/2015  Patient Name: Dorothy [name]    History of Presenting Illness     Chief Complaint   Patient presents with   . Chest Pain       History Provided By: Patient    Chief Complaint: CP  Onset: ~ 3 hours ago  Timing: Constant  Location: Sternal  Quality: Sharp  Severity: Moderate  Exacerbating factors: Worse without her medication  Alleviating factors: None  Associated Symptoms: None  Pertinent Negatives: No n/v or SOB    Additional History: Dorothy [name] is a 40 y.o. female presenting to the ED with constant, sharp, sternal CP, starting ~3 hours ago. Pt says that she has DM and that she has been unable to take her medication recently. She presented to the ED ~1 month ago for blurry vision, which has since resolved.  Pt lives in a shelter and explains that she is unable to eat a diabetic diet because of that and has been eating cake and various sweets. She has no n/v or SOB. Chest pain is substernal , nonradiating, without other associated symptoms.  There is no known exacerbating or relieving quality.    PCP: Pcp, Noneorunknown, MD  SPECIALISTS:    No current facility-administered medications for this encounter.     Current Outpatient Prescriptions   Medication Sig Dispense Refill   . ATORVASTATIN CALCIUM PO Take by mouth.     Marland Kitchen FREESTYLE TEST STRIPS test strip Check blood sugar twice a day. 100 each 0   . GABAPENTIN PO Take by mouth.     Marland Kitchen glucose monitoring kit (FREESTYLE) monitoring kit use as instructed 1 each 0   . insulin aspart (NOVOLOG) 100 UNIT/ML injection Inject 3 Units into the skin 3 (three) times daily before meals. 10 mL 0   . insulin glargine (LANTUS) 100 UNIT/ML injection Inject 45 Units into the skin nightly. 10 mL 0   . liraglutide (VICTOZA) 18 MG/3ML Solution Pen-injector      . lisinopril (PRINIVIL,ZESTRIL) 20 MG tablet Take 20 mg by mouth daily.      . metFORMIN (GLUCOPHAGE) 500 MG tablet Take 1 tablet (500 mg total) by mouth 2 (two) times daily with meals. 14 tablet 0   . VITAMIN D, ERGOCALCIFEROL, PO Take by mouth.         Past History     Past Medical History:  Past Medical History   Diagnosis Date   . Diabetes mellitus without complication    . Asthma without status asthmaticus    . Anemia    . Depression    . Anemia    . Panic attack    . Seasonal allergies    . High cholesterol        Past Surgical History:  Past Surgical History   Procedure Laterality Date   . Cesarean section     . Cholecystectomy     . Tonsillectomy     . Hernia repair         Family History:  Family History   Problem Relation Age of Onset   . Diabetes Mother    . Diabetes Sister    . Diabetes Maternal Aunt    . Hypertension Paternal Grandmother    . No known problems Daughter    . No known problems Son    . No known problems  Daughter    . No known problems Son        Social History:  Social History   Substance Use Topics   . Smoking status: Never Smoker    . Smokeless tobacco: None   . Alcohol Use: No       Allergies:  Allergies   Allergen Reactions   . Latex    . Morphine Itching     Generalized bumps with itchiness   . Percocet [Oxycodone-Acetaminophen]      Unknown reaction     . Tape        Review of Systems     Review of Systems   Constitutional: Negative for fever and chills.   HENT: Negative for congestion and rhinorrhea.    Eyes: Negative for discharge and redness.   Respiratory: Negative for chest tightness, shortness of breath and wheezing.    Cardiovascular: Positive for chest pain. Negative for palpitations.   Gastrointestinal: Negative for nausea, vomiting and abdominal pain.   Genitourinary: Negative for dysuria and hematuria.   Musculoskeletal: Negative for back pain, neck pain and neck stiffness.   Skin: Negative for rash and wound.   Neurological: Negative for dizziness, weakness, numbness and headaches.   Psychiatric/Behavioral: Negative for confusion.        Physical Exam   BP 134/82 mmHg  Pulse 79  Temp(Src) 97.8 F (36.6 C) (Oral)  Resp 16  SpO2 100%  LMP 08/06/2015    Physical Exam   Constitutional: She is oriented to person, place, and time. She appears well-developed and well-nourished. No distress.   HENT:   Head: Normocephalic and atraumatic.   Eyes: Conjunctivae are normal. No scleral icterus.   Neck: Normal range of motion. No JVD present.   Cardiovascular: Normal rate, regular rhythm, normal heart sounds and intact distal pulses.  Exam reveals no gallop and no friction rub.    No murmur heard.  Pulmonary/Chest: Effort normal and breath sounds normal. No respiratory distress. She has no wheezes.   Abdominal: Soft. Bowel sounds are normal. She exhibits no distension and no mass. There is no tenderness. There is no rebound and no guarding.   Musculoskeletal: Normal range of motion. She exhibits no edema or tenderness.   Left lower extremity cramping   Neurological: She is alert and oriented to person, place, and time.   Skin: Skin is warm and dry. No rash noted. She is not diaphoretic.   Psychiatric: She has a normal mood and affect. Her behavior is normal.       Diagnostic Study Results     Labs -     Results     Procedure Component Value Units Date/Time    Troponin I [[id number]] Collected:  09/02/15 2116    Specimen Information:  Blood Updated:  09/02/15 2217     Troponin I <0.01 ng/mL     Narrative:      To order a timed Troponin, enter the appropriate time    GFR [[id number]] Collected:  09/02/15 2116     EGFR >60.0 Updated:  09/02/15 2209    Narrative:      To order a timed Troponin, enter the appropriate time    Basic Metabolic Panel (BMP) [[id number]]  (Abnormal) Collected:  09/02/15 2116    Specimen Information:  Blood Updated:  09/02/15 2209     Glucose 335 (H) mg/dL      BUN 08.6 mg/dL      Creatinine 0.8 mg/dL      Calcium 8.9 mg/dL  Sodium 133 (L) mEq/L      Potassium 4.0 mEq/L      Chloride 103 mEq/L      CO2 20 (L) mEq/L      Anion Gap  10.0     Narrative:      To order a timed Troponin, enter the appropriate time    Hemolysis index [[id number]] Collected:  09/02/15 2116     Hemolysis Index 12 Updated:  09/02/15 2209    Narrative:      To order a timed Troponin, enter the appropriate time    Glucose Whole Blood - POCT [[id number]]  (Abnormal) Collected:  09/02/15 2101     POCT - Glucose Whole blood 343 (H) mg/dL Updated:  78/46/96 2952    Magnesium [[id number]] Collected:  09/02/15 1751    Specimen Information:  Blood Updated:  09/02/15 2006     Magnesium 1.7 mg/dL     Phosphorus [[id number]] Collected:  09/02/15 1751    Specimen Information:  Blood Updated:  09/02/15 2006     Phosphorus 3.2 mg/dL     Hemolysis index [[id number]] Collected:  09/02/15 1751     Hemolysis Index 8 Updated:  09/02/15 2006    Comprehensive metabolic panel [[id number]]  (Abnormal) Collected:  09/02/15 1751    Specimen Information:  Blood Updated:  09/02/15 1919     Glucose 628 (HH) mg/dL      BUN 32.9 mg/dL      Creatinine 1.0 mg/dL      Sodium 518 (L) mEq/L      Potassium 4.3 mEq/L      Chloride 96 (L) mEq/L      CO2 20 (L) mEq/L      Calcium 9.3 mg/dL      Protein, Total 7.4 g/dL      Albumin 3.3 (L) g/dL      AST (SGOT) 10 U/L      ALT 10 U/L      Alkaline Phosphatase 101 U/L      Bilirubin, Total 0.2 mg/dL      Globulin 4.1 (H) g/dL      Albumin/Globulin Ratio 0.8 (L)      Anion Gap 11.0     Troponin I [[id number]] Collected:  09/02/15 1751    Specimen Information:  Blood Updated:  09/02/15 1919     Troponin I <0.01 ng/mL     Hemolysis index [[id number]] Collected:  09/02/15 1751     Hemolysis Index 6 Updated:  09/02/15 1919    GFR [[id number]] Collected:  09/02/15 1751     EGFR >60.0 Updated:  09/02/15 1919    Venous Blood Gas [[id number]] Collected:  09/02/15 1840    Specimen Information:  Blood Updated:  09/02/15 1858     pH, Ven 7.408      pCO2, Venous 35.4 mmhg      pO2, Venous 81.6 mmhg      HCO3, Ven 21.9 mEq/L      Venous Total CO2 19.6 mEq/L      Base Excess, Ven -1.8  mEq/L      O2 Sat, Venous 96.6 %      Temperature 37.0      VBG CollectionSite Other     CBC with differential [[id number]]  (Abnormal) Collected:  09/02/15 1751    Specimen Information:  Blood from Blood Updated:  09/02/15 1824     WBC 7.93 x10 3/uL      Hgb 11.8 (L) g/dL      Hematocrit 60.7 (L) %  Platelets 320 x10 3/uL      RBC 4.40 x10 6/uL      MCV 77.5 (L) fL      MCH 26.8 (L) pg      MCHC 34.6 g/dL      RDW 15 %      MPV 9.9 fL      Neutrophils 66 %      Lymphocytes Automated 28 %      Monocytes 5 %      Eosinophils Automated 2 %      Basophils Automated 0 %      Immature Granulocyte 0 %      Nucleated RBC 0 /100 WBC      Neutrophils Absolute 5.20 x10 3/uL      Abs Lymph Automated 2.19 x10 3/uL      Abs Mono Automated 0.37 x10 3/uL      Abs Eos Automated 0.16 x10 3/uL      Absolute Baso Automated 0.01 x10 3/uL      Absolute Immature Granulocyte 0.02 x10 3/uL     Glucose Whole Blood - POCT [[id number]]  (Abnormal) Collected:  09/02/15 1806     POCT - Glucose Whole blood 591 (HH) mg/dL Updated:  65/78/46 9629          Radiologic Studies -   Radiology Results (24 Hour)     ** No results found for the last 24 hours. **      .    Medical Decision Making   I am the first provider for this patient.    I reviewed the vital signs, available nursing notes, past medical history, past surgical history, family history and social history.    Vital Signs-Reviewed the patient's vital signs.     Patient Vitals for the past 12 hrs:   BP Temp Pulse Resp   09/02/15 2153 134/82 mmHg 97.8 F (36.6 C) 79 16   09/02/15 1936 119/64 mmHg - 90 16   09/02/15 1843 108/54 mmHg - 92 18   09/02/15 1741 140/79 mmHg 97.7 F (36.5 C) 86 14       Pulse Oximetry Analysis - Normal 100% on RA    Cardiac Monitor:  Rate: 79 bpm  Rhythm:  Normal Sinus Rhythm     EKG:  Interpreted by the EP.   Time Interpreted: 17:53    Rate: 90 bpm   Rhythm: Normal Sinus Rhythm    Interpretation: Normal PR, QRS and QT intervals.  No ST segment changes or  significant T wave inversions.   Comparison: Compared to 08/20/2015: no change     EKG:  Interpreted by the Emergency Physician.   Time Interpreted: 21:21   Rate: 80 bpm   Rhythm: Normal Sinus Rhythm   Interpretation: Normal PR, QRS and QT intervals.  No ST segment changes or significant T wave inversions.   Comparison: Similar compared to 17:53    Core Measures: Aspirin not given (noncardiac CP)    Clinical Decision Support:       Old Medical Records:   Old medical records: Pt was seen for her DM on the 16th, 20th and 21st of April. She was seen in the ED for CP on the 27th of April. Pt normally takes lantis 45 and insulin 3 units.  Nursing notes.     ED Course:     7:47 PM - Pt is refusing chest x-ray.    10:10 PM - Pt was re-evaluated and she is resting comfortably. She was informed  of her lab results and the plan for her to speak to case management. Pt gave the number for her social worker: [phone]. She is ready to be discharged.    10:30 PM - Left message on social worker's phone, discussing the pt's case.    10:35 PM - Told the pt about the message left for her social worker and the prescription to be left with them. Discussed lab results with pt and counseled on diagnosis, f/u plans with [facility], medication use (Gluophage, Lantus, Novolog and the glucose monitoring kit), and signs and symptoms when to return to ED.  Pt is stable and ready for discharge.       10:40 PM - Pt was given a note to notify the staff at the shelter, where she lives, of her dietary restrictions.     Provider Notes:   40 y/o woman with multiple complaints    Hyperglycemia - combination of diet and noncompliance vs inability to take medications (pt report unable to access insulin and the food at shelter is mostly sweets/high in carbohydrates - will give note to shelter regarding diet needs as well as leave message with social work about helping with medications and glucose monitoring). Normal anion gap. Given ivf, insulin and lantus  in ed, glucose improved.    Chest pain - constant, nonexertional, began 3 hours prior to arrival without sob, back pain, abd pain, n/v, or other associated symptoms.    HEART SCORE  Used for ACS Risk Stratification  Low Risk = total score of 0-3  High Risk = total score of 4 or greater    History:         Slightly suspicous  ECG:            Normal  Age:              <=45  Risk:             >=3 risk factors or history of atherosclerotic disease  +2  Troponin:      <= normal limit    Score: 2    This patient with chest pain does not have a clinical presentation highly concerning for acute myocardial infarction or unstable angina, the EKG does not show a new ischemic abnormality, the troponin is negative, there are no alternative diagnoses necessitating hospital admission, there are no identified barriers to outpatient followup, the HEART score is 0-3, and the troponin was drawn 3-6 hours after onset of chest pain.  Therefore, per the EMA Chest Pain Pathway, the EKG and troponin will be repeated 6 hours after the onset of chest pain.  If the EKG is unchanged and the troponin is negative, this patient is at very low risk for ACS within 30 days and is safe for discharge.      Diagnosis     Clinical Impression:   1. Atypical chest pain    2. Hyperglycemia        Treatment Plan:   ED Disposition     Discharge Dorothy [name] discharge to home/self care.    Condition at disposition: Stable              _______________________________      Attestations: This note is prepared by Areatha Keas, acting as scribe for Geannie Risen, MD.    The scribe's documentation has been prepared under my direction and personally reviewed by me in its entirety.  I confirm that the note above accurately reflects all work, treatment, procedures,  and medical decision making performed by me.    _______________________________    Thea Gist, MD  09/06/15 2308

## 2015-09-02 NOTE — ED Notes (Signed)
Bed: PU31  Expected date: 09/02/15  Expected time: 5:29 PM  Means of arrival: [id number] [place]  Comments:  [facility]

## 2015-09-03 ENCOUNTER — Encounter (INDEPENDENT_AMBULATORY_CARE_PROVIDER_SITE_OTHER): Payer: Self-pay

## 2015-09-03 LAB — ECG 12-LEAD
Atrial Rate: 90 {beats}/min
Atrial Rate: 80 {beats}/min
P Axis: 56 degrees
P Axis: 61 degrees
P-R Interval: 142 ms
P-R Interval: 152 ms
Q-T Interval: 378 ms
Q-T Interval: 396 ms
QRS Duration: 94 ms
QRS Duration: 100 ms
QTC Calculation (Bezet): 462 ms
QTC Calculation (Bezet): 456 ms
R Axis: 3 degrees
R Axis: -1 degrees
T Axis: 14 degrees
T Axis: 21 degrees
Ventricular Rate: 90 {beats}/min
Ventricular Rate: 80 {beats}/min

## 2015-09-03 NOTE — Progress Notes (Signed)
Received a call from the MD that saw the pt last evening requesting medication assistance. CM reviewed the case and determined the pt has both Medicare and Medicaid but no PCP. Pt is currently staying at [facility] shelter. She is in agreement w/ establishing a PCP via IMG. Appt scheduled for 09/04/15 11:30AM. Pt on three way as appt is being scheduled. Appt to establish primary care as well as medication review due to pt complaints she can't maintain compliance (financial issues). Pt in agreement w/ the plan. No further needs identified.    Noel Christmas, LGSW  [phone] (ED)

## 2015-09-03 NOTE — Progress Notes (Signed)
[facility]    Received a referral to schedule a follow up appointment with the [facility].  Left patient a voicemail and provided main clinic phone number.  Requested patient return call to schedule a follow up appointment as soon as possible.       Dorothy [name]  Transitional Services   Sched Reg Rep II  T [phone]  F [phone]

## 2015-09-04 ENCOUNTER — Ambulatory Visit (INDEPENDENT_AMBULATORY_CARE_PROVIDER_SITE_OTHER): Payer: Medicare Other | Admitting: Internal Medicine

## 2015-09-18 ENCOUNTER — Emergency Department
Admission: EM | Admit: 2015-09-18 | Discharge: 2015-09-18 | Disposition: A | Discharge status: Home / Self Care | Payer: Medicare Other | Attending: Emergency Medicine | Admitting: Emergency Medicine

## 2015-09-18 ENCOUNTER — Emergency Department: Payer: Medicare Other

## 2015-09-18 DIAGNOSIS — E1165 Type 2 diabetes mellitus with hyperglycemia: Secondary | ICD-10-CM | POA: Insufficient documentation

## 2015-09-18 DIAGNOSIS — Z794 Long term (current) use of insulin: Secondary | ICD-10-CM | POA: Insufficient documentation

## 2015-09-18 DIAGNOSIS — Z79899 Other long term (current) drug therapy: Secondary | ICD-10-CM | POA: Insufficient documentation

## 2015-09-18 DIAGNOSIS — E7800 Pure hypercholesterolemia, unspecified: Secondary | ICD-10-CM | POA: Insufficient documentation

## 2015-09-18 DIAGNOSIS — J45909 Unspecified asthma, uncomplicated: Secondary | ICD-10-CM | POA: Insufficient documentation

## 2015-09-18 LAB — CBC AND DIFFERENTIAL
Basophils Absolute Automated: 0.02 10*3/uL (ref 0.00–0.20)
Basophils Automated: 0 %
Eosinophils Absolute Automated: 0.21 10*3/uL (ref 0.00–0.70)
Eosinophils Automated: 3 %
Hematocrit: 34.6 % — ABNORMAL LOW (ref 37.0–47.0)
Hgb: 11.8 g/dL — ABNORMAL LOW (ref 12.0–16.0)
Immature Granulocytes Absolute: 0.03 10*3/uL
Immature Granulocytes: 0 %
Lymphocytes Absolute Automated: 2.43 10*3/uL (ref 0.50–4.40)
Lymphocytes Automated: 34 %
MCH: 26.6 pg — ABNORMAL LOW (ref 28.0–32.0)
MCHC: 34.1 g/dL (ref 32.0–36.0)
MCV: 77.9 fL — ABNORMAL LOW (ref 80.0–100.0)
MPV: 9.7 fL (ref 9.4–12.3)
Monocytes Absolute Automated: 0.47 10*3/uL (ref 0.00–1.20)
Monocytes: 6 %
Neutrophils Absolute: 4.05 10*3/uL (ref 1.80–8.10)
Neutrophils: 56 %
Nucleated RBC: 0 /100 WBC (ref 0–1)
Platelets: 357 10*3/uL (ref 140–400)
RBC: 4.44 10*6/uL (ref 4.20–5.40)
RDW: 15 % (ref 12–15)
WBC: 7.18 10*3/uL (ref 3.50–10.80)

## 2015-09-18 LAB — URINALYSIS, REFLEX TO MICROSCOPIC EXAM IF INDICATED
Bilirubin, UA: NEGATIVE
Blood, UA: NEGATIVE
Glucose, UA: >=500 — AB
Ketones UA: NEGATIVE
Nitrite, UA: NEGATIVE
Protein, UR: NEGATIVE
Specific Gravity UA: >1.035 — AB (ref 1.001–1.035)
Urine pH: 6 (ref 5.0–8.0)
Urobilinogen, UA: NEGATIVE mg/dL

## 2015-09-18 LAB — BASIC METABOLIC PANEL
Anion Gap: 13 (ref 5.0–15.0)
BUN: 11 mg/dL (ref 7–19)
CO2: 23 mEq/L (ref 22–29)
Calcium: 9.5 mg/dL (ref 8.5–10.5)
Chloride: 99 mEq/L — ABNORMAL LOW (ref 100–111)
Creatinine: 0.7 mg/dL (ref 0.6–1.0)
Glucose: 332 mg/dL — ABNORMAL HIGH (ref 70–100)
Potassium: 4.2 mEq/L (ref 3.5–5.1)
Sodium: 135 mEq/L — ABNORMAL LOW (ref 136–145)

## 2015-09-18 LAB — GFR: EGFR: >60

## 2015-09-18 LAB — GLUCOSE WHOLE BLOOD - POCT: Whole Blood Glucose POCT: 351 mg/dL — ABNORMAL HIGH (ref 70–100)

## 2015-09-18 MED ORDER — SODIUM CHLORIDE 0.9 % IV BOLUS
1000.0000 mL | Freq: Once | INTRAVENOUS | Status: AC
Start: 2015-09-18 — End: 2015-09-18
  Administered 2015-09-18: 1000 mL via INTRAVENOUS

## 2015-09-18 MED ORDER — LIRAGLUTIDE 18 MG/3ML SC SOPN
1.8000 mg | PEN_INJECTOR | Freq: Every day | SUBCUTANEOUS | Status: AC
Start: 2015-09-18 — End: ?

## 2015-09-18 MED ORDER — METFORMIN HCL 500 MG PO TABS
500.0000 mg | ORAL_TABLET | Freq: Once | ORAL | Status: AC
Start: 2015-09-18 — End: 2015-09-18
  Administered 2015-09-18: 500 mg via ORAL
  Filled 2015-09-18: qty 1

## 2015-09-18 MED ORDER — METFORMIN HCL 500 MG PO TABS
500.0000 mg | ORAL_TABLET | Freq: Two times a day (BID) | ORAL | Status: AC
Start: 2015-09-18 — End: 2015-10-18

## 2015-09-18 NOTE — ED Provider Notes (Signed)
EMERGENCY DEPARTMENT NOTE    Physician/Midlevel provider first contact with patient: 09/18/15 2102         HISTORY OF PRESENT ILLNESS   Historian: patient  Translator Used: no    40 y.o. female presents with not feeling well.    1. Location of symptoms: not feeling well  2. Onset of symptoms: 8 pm  3. What was patient doing when symptoms started (Context): riding bus, states her boyfriend in [place] took her metformin (500 mg bid) and victoza (1.8 units daily), she took her lantus 45 units this morning  4. Severity: 6/10  5. Timing: acute onset  6. Activities that worsen symptoms: none  7. Activities that improve symptoms: none  8. Quality: not feeling well. BS in 300's for EMS.   9. Radiation of symptoms: none  10. Associated signs and Symptoms: anxiety   11. Are symptoms worsening? yes          MEDICAL HISTORY     Past Medical History:  Past Medical History   Diagnosis Date   . Diabetes mellitus without complication    . Asthma without status asthmaticus    . Anemia    . Depression    . Anemia    . Panic attack    . Seasonal allergies    . High cholesterol        Past Surgical History:  Past Surgical History   Procedure Laterality Date   . Cesarean section     . Cholecystectomy     . Tonsillectomy     . Hernia repair         Social History:  Social History     Social History   . Marital Status: Divorced     Spouse Name: N/A   . Number of Children: N/A   . Years of Education: N/A     Occupational History   . Not on file.     Social History Main Topics   . Smoking status: Never Smoker    . Smokeless tobacco: Not on file   . Alcohol Use: No   . Drug Use: No   . Sexual Activity: Not on file     Other Topics Concern   . Not on file     Social History Narrative       Family History:  Family History   Problem Relation Age of Onset   . Diabetes Mother    . Diabetes Sister    . Diabetes Maternal Aunt    . Hypertension Paternal Grandmother    . No known problems Daughter    . No known problems Son    . No known problems  Daughter    . No known problems Son        Outpatient Medication:  Discharge Medication List as of 09/18/2015 10:15 PM      CONTINUE these medications which have NOT CHANGED    Details   ATORVASTATIN CALCIUM PO Take by mouth., Until Discontinued, Historical Med      FREESTYLE TEST STRIPS test strip Check blood sugar twice a day., Print      GABAPENTIN PO Take by mouth., Until Discontinued, Historical Med      glucose monitoring kit (FREESTYLE) monitoring kit use as instructed, Print      insulin aspart (NOVOLOG) 100 UNIT/ML injection Inject 3 Units into the skin 3 (three) times daily before meals., Starting 09/02/2015, Until Discontinued, No Print      insulin glargine (LANTUS) 100 UNIT/ML injection  Inject 45 Units into the skin nightly., Starting 09/02/2015, Until Discontinued, Print      lisinopril (PRINIVIL,ZESTRIL) 20 MG tablet Take 20 mg by mouth daily., Until Discontinued, Historical Med      VITAMIN D, ERGOCALCIFEROL, PO Take by mouth., Until Discontinued, Historical Med             Allergies:  Allergies   Allergen Reactions   . Latex    . Morphine Itching     Generalized bumps with itchiness   . Percocet [Oxycodone-Acetaminophen]      Unknown reaction     . Tape          REVIEW OF SYSTEMS   Review of Systems   Constitutional: Positive for malaise/fatigue.   All other systems reviewed and are negative.        PHYSICAL EXAM     Filed Vitals:    09/18/15 2231   BP: 141/80   Pulse: 89   Temp: 97.9 F (36.6 C)   Resp: 18   SpO2: 96%       Nursing note and vitals reviewed.    Constitutional: non-toxic  Head: Atraumatic.  Eyes: PERRL. EOMI. No scleral icterus.  ENT: Mucous membranes are moist and intact. Oropharynx is clear. Patent airway.  Neck: Supple. No cervical lymphadenopathy.  Cardiovascular: Regular rate. Regular rhythm. No murmurs, rubs, or gallops.  Pulmonary/Chest: No evidence of respiratory distress. Clear to auscultation bilaterally. No wheezing, rales or rhonchi.   GI: Soft, non-distended abdomen. No  tenderness to palpation of abdomen.  Extremities: No edema. No deformity.  Skin: No rash.   Neurological: Awake, alert and oriented x 3. CN II-XII intact. Strength intact. Sensation intact.  Psychiatric: Appropriate affect. Appropriate mood. Appropriate behavior.    MEDICAL DECISION MAKING   "not feeling well". Normal exam.  Boyfriend took some of her meds for DM. DM is poorly controlled as result. Elevated BS. Checked labs. No evidence of DKA. Gave IV fluids for hyperglycemia, possible dehydration. Prescribed her missing meds. Discharged home.      DISCUSSION      Vital Signs: Reviewed the patient?s vital signs.   Nursing Notes: Reviewed and utilized available nursing notes.  Medical Records Reviewed: Reviewed available past medical records.  Counseling: The emergency provider has spoken with the patient and discussed today?s findings, in addition to providing specific details for the plan of care.  Questions are answered and there is agreement with the plan.    IMAGING STUDIES    The following imaging studies were independently interpreted by the Emergency Medicine Physician.  For full imaging study results please see chart.    CARDIAC STUDIES     The following cardiac studies were independently interpreted by the Emergency Medicine Physician. For full cardiac study results please see chart     PULSE OXIMETRY        EMERGENCY DEPT. MEDICATIONS      ED Medication Orders     Start Ordered     Status Ordering Provider    09/18/15 2214 09/18/15 2213  metFORMIN (GLUCOPHAGE) tablet 500 mg   Once     Route: Oral  Ordered Dose: 500 mg     Last MAR action:  Given [name]    09/18/15 2109 09/18/15 2108  sodium chloride 0.9 % bolus 1,000 mL   Once     Route: Intravenous  Ordered Dose: 1,000 mL     Last MAR action:  Stopped [name] [facility]  LABORATORY RESULTS    Ordered and independently interpreted AVAILABLE laboratory tests. Please see results section in chart for full details.  Results for  orders placed or performed during the hospital encounter of 09/18/15   CBC with differential   Result Value Ref Range    WBC 7.18 3.50 - 10.80 x10 3/uL    Hgb 11.8 (L) 12.0 - 16.0 g/dL    Hematocrit 20.2 (L) 37.0 - 47.0 %    Platelets 357 140 - 400 x10 3/uL    RBC 4.44 4.20 - 5.40 x10 6/uL    MCV 77.9 (L) 80.0 - 100.0 fL    MCH 26.6 (L) 28.0 - 32.0 pg    MCHC 34.1 32.0 - 36.0 g/dL    RDW 15 12 - 15 %    MPV 9.7 9.4 - 12.3 fL    Neutrophils 56 None %    Lymphocytes Automated 34 None %    Monocytes 6 None %    Eosinophils Automated 3 None %    Basophils Automated 0 None %    Immature Granulocyte 0 None %    Nucleated RBC 0 0 - 1 /100 WBC    Neutrophils Absolute 4.05 1.80 - 8.10 x10 3/uL    Abs Lymph Automated 2.43 0.50 - 4.40 x10 3/uL    Abs Mono Automated 0.47 0.00 - 1.20 x10 3/uL    Abs Eos Automated 0.21 0.00 - 0.70 x10 3/uL    Absolute Baso Automated 0.02 0.00 - 0.20 x10 3/uL    Absolute Immature Granulocyte 0.03 0 x10 3/uL   Basic Metabolic Panel (BMP)   Result Value Ref Range    Glucose 332 (H) 70 - 100 mg/dL    BUN 11 7 - 19 mg/dL    Creatinine 0.7 0.6 - 1.0 mg/dL    Calcium 9.5 8.5 - 54.2 mg/dL    Sodium 706 (L) 237 - 145 mEq/L    Potassium 4.2 3.5 - 5.1 mEq/L    Chloride 99 (L) 100 - 111 mEq/L    CO2 23 22 - 29 mEq/L    Anion Gap 13.0 5.0 - 15.0   UA, Reflex to Microscopic   Result Value Ref Range    Urine Type Clean Catch     Color, UA Yellow Clear - Yellow    Clarity, UA Cloudy (A) Clear - Hazy    Specific Gravity UA >1.035 (A) 1.001-1.035    Urine pH 6.0 5.0-8.0    Leukocyte Esterase, UA Small (A) Negative    Nitrite, UA Negative Negative    Protein, UR Negative Negative    Glucose, UA >=500 (A) Negative    Ketones UA Negative Negative    Urobilinogen, UA Negative 0.2  -  2.0 mg/dL    Bilirubin, UA Negative Negative    Blood, UA Negative Negative    RBC, UA 6 - 10 (A) 0 - 5 /hpf    WBC, UA 0 - 5 0 - 5 /hpf    Squamous Epithelial Cells, Urine 11 - 25 0 - 25 /hpf    Yeast, UA Few (A) None   GFR   Result  Value Ref Range    EGFR >60.0    Glucose Whole Blood - POCT   Result Value Ref Range    POCT - Glucose Whole blood 351 (H) 70 - 100 mg/dL       CONSULTATIONS        CRITICAL CARE        ATTESTATIONS  Physician Attestation: Darlyn Read MD, have been the primary provider for Dorothy [name] during this Emergency Dept visit and have reviewed the chart for accuracy and agree with its content.       DIAGNOSIS      Diagnosis:  Final diagnoses:   Type 2 diabetes mellitus with hyperglycemia, with long-term current use of insulin       Disposition:  ED Disposition     Discharge Dorothy [name] discharge to home/self care.    Condition at disposition: Stable            Prescriptions:  Discharge Medication List as of 09/18/2015 10:15 PM      CONTINUE these medications which have CHANGED    Details   liraglutide (VICTOZA) 18 MG/3ML Solution Pen-injector Inject 0.3 mLs (1.8 mg total) into the skin daily., Starting 09/18/2015, Until Discontinued, Print      metFORMIN (GLUCOPHAGE) 500 MG tablet Take 1 tablet (500 mg total) by mouth 2 (two) times daily with meals., Starting 09/18/2015, Until Sun 10/18/15, Print         CONTINUE these medications which have NOT CHANGED    Details   ATORVASTATIN CALCIUM PO Take by mouth., Until Discontinued, Historical Med      FREESTYLE TEST STRIPS test strip Check blood sugar twice a day., Print      GABAPENTIN PO Take by mouth., Until Discontinued, Historical Med      glucose monitoring kit (FREESTYLE) monitoring kit use as instructed, Print      insulin aspart (NOVOLOG) 100 UNIT/ML injection Inject 3 Units into the skin 3 (three) times daily before meals., Starting 09/02/2015, Until Discontinued, No Print      insulin glargine (LANTUS) 100 UNIT/ML injection Inject 45 Units into the skin nightly., Starting 09/02/2015, Until Discontinued, Print      lisinopril (PRINIVIL,ZESTRIL) 20 MG tablet Take 20 mg by mouth daily., Until Discontinued, Historical Med      VITAMIN D, ERGOCALCIFEROL,  PO Take by mouth., Until Discontinued, Historical Med                 Marland Mcalpine, MD  09/18/15 2340

## 2015-09-18 NOTE — Discharge Instructions (Signed)
Diabetes Mellitus, Type II     You have been diagnosed with type II diabetes.     Diabetes is a disease. It is when the body has trouble regulating the amount of sugar in the blood stream.     Type II diabetes is sometimes called "adult-onset diabetes". It is usually found in adults older than 40. However, type II diabetes is also seen in children and young adults.     Diabetes is a very serious disease. When not controlled, it can cause life-threatening problems. Untreated diabetes can lead to heart problems and kidney problems (including kidney failure). It can also lead to stroke and blindness.     Your insulin or pills will help keep your sugar under control. Also follow the diet prescribed by your doctor to keep your sugar under control. Untreated diabetes can cause heart problems, stroke or blindness. Therefore, it is very important for you to follow up with your regular doctor.     Check your blood sugar before every meal and before bedtime.     Insulin and other medicines for diabetes can make you feel lightheaded. You may sweat or even pass out. Always keep a snack food that contains sugar with you. If you begin to have these symptoms, eat the snack food to see if the symptoms get better. NEVER drive a car when feeling this way.     YOU SHOULD SEEK MEDICAL ATTENTION IMMEDIATELY, EITHER HERE OR AT THE NEAREST EMERGENCY DEPARTMENT, IF ANY OF THE FOLLOWING OCCURS:  · Abdominal (belly) pain.  · More than 3 vomiting (throwing up) episodes.  · Blood sugar over 400 more than 3 times.  · Fever (temperature higher than 100.4ºF / 38ºC) or shaking chills.  · Unable to keep medicines down.  · Any trouble swallowing or breathing.  · Infection or rash on your skin.  · Low blood sugar (less than 70) 3 or more times that does not get better after eating.

## 2015-09-18 NOTE — ED Notes (Signed)
On phone talking in no distress, talking about her next date, her allergy to latex, and "I'm allergic to latex, I heard they got sheepskin or something.  We can talk about that tomorrow."   Appears comfortable

## 2015-09-18 NOTE — ED Notes (Signed)
Bed: E11  Expected date:   Expected time:   Means of arrival:   Comments:  medic

## 2015-09-27 ENCOUNTER — Emergency Department: Payer: Medicare Other

## 2015-09-27 ENCOUNTER — Emergency Department
Admission: EM | Admit: 2015-09-27 | Discharge: 2015-09-27 | Disposition: A | Discharge status: Home / Self Care | Payer: Medicare Other | Attending: Emergency Medicine | Admitting: Emergency Medicine

## 2015-09-27 DIAGNOSIS — E7800 Pure hypercholesterolemia, unspecified: Secondary | ICD-10-CM | POA: Insufficient documentation

## 2015-09-27 DIAGNOSIS — Z794 Long term (current) use of insulin: Secondary | ICD-10-CM | POA: Insufficient documentation

## 2015-09-27 DIAGNOSIS — M79672 Pain in left foot: Secondary | ICD-10-CM | POA: Insufficient documentation

## 2015-09-27 DIAGNOSIS — E119 Type 2 diabetes mellitus without complications: Secondary | ICD-10-CM | POA: Insufficient documentation

## 2015-09-27 DIAGNOSIS — M79641 Pain in right hand: Secondary | ICD-10-CM | POA: Insufficient documentation

## 2015-09-27 DIAGNOSIS — Z7984 Long term (current) use of oral hypoglycemic drugs: Secondary | ICD-10-CM | POA: Insufficient documentation

## 2015-09-27 DIAGNOSIS — Z79899 Other long term (current) drug therapy: Secondary | ICD-10-CM | POA: Insufficient documentation

## 2015-09-27 DIAGNOSIS — J45909 Unspecified asthma, uncomplicated: Secondary | ICD-10-CM | POA: Insufficient documentation

## 2015-09-27 MED ORDER — METFORMIN HCL 500 MG PO TABS
500.0000 mg | ORAL_TABLET | Freq: Two times a day (BID) | ORAL | Status: DC
Start: 2015-09-27 — End: 2015-10-05

## 2015-09-27 NOTE — ED Provider Notes (Signed)
EMERGENCY DEPARTMENT NOTE    Physician/Midlevel provider first contact with patient: 09/27/15 1940         HISTORY OF PRESENT ILLNESS   Historian:Patient  Translator Used: No    Chief Complaint: Hand Pain and Foot Pain     Mechanism of Injury: denies injury      40 y.o. female ambulatory to the ed presents with right hand and left foot pain for months. Denies trauma, fall or injury. Pt is right hand dominant.     1. Location of symptoms: Right hand and left foot  2. Onset of symptoms: Few months  3. What was patient doing when symptoms started (Context): see above  4. Severity: moderate  5. Timing: Constant  6. Activities that worsen symptoms: Movement  7. Activities that improve symptoms: Nothing  8. Quality: Aching and sore  9. Radiation of symptoms: no  10. Associated signs and Symptoms: see above  11. Are symptoms worsening? yes  MEDICAL HISTORY     Past Medical History:  Past Medical History   Diagnosis Date   . Diabetes mellitus without complication    . Asthma without status asthmaticus    . Anemia    . Depression    . Anemia    . Panic attack    . Seasonal allergies    . High cholesterol        Past Surgical History:  Past Surgical History   Procedure Laterality Date   . Cesarean section     . Cholecystectomy     . Tonsillectomy     . Hernia repair         Social History:  Social History     Social History   . Marital Status: Divorced     Spouse Name: N/A   . Number of Children: N/A   . Years of Education: N/A     Occupational History   . Not on file.     Social History Main Topics   . Smoking status: Never Smoker    . Smokeless tobacco: Not on file   . Alcohol Use: No   . Drug Use: No   . Sexual Activity: Not on file     Other Topics Concern   . Not on file     Social History Narrative       Family History:  Family History   Problem Relation Age of Onset   . Diabetes Mother    . Diabetes Sister    . Diabetes Maternal Aunt    . Hypertension Paternal Grandmother    . No known problems Daughter    . No known  problems Son    . No known problems Daughter    . No known problems Son        Outpatient Medication:  Previous Medications    ATORVASTATIN CALCIUM PO    Take by mouth.    FREESTYLE TEST STRIPS TEST STRIP    Check blood sugar twice a day.    GABAPENTIN PO    Take by mouth.    GLUCOSE MONITORING KIT (FREESTYLE) MONITORING KIT    use as instructed    INSULIN ASPART (NOVOLOG) 100 UNIT/ML INJECTION    Inject 3 Units into the skin 3 (three) times daily before meals.    INSULIN GLARGINE (LANTUS) 100 UNIT/ML INJECTION    Inject 45 Units into the skin nightly.    LIRAGLUTIDE (VICTOZA) 18 MG/3ML SOLUTION PEN-INJECTOR    Inject 0.3 mLs (1.8 mg total) into  the skin daily.    LISINOPRIL (PRINIVIL,ZESTRIL) 20 MG TABLET    Take 20 mg by mouth daily.    METFORMIN (GLUCOPHAGE) 500 MG TABLET    Take 1 tablet (500 mg total) by mouth 2 (two) times daily with meals.    VITAMIN D, ERGOCALCIFEROL, PO    Take by mouth.         REVIEW OF SYSTEMS   Review of Systems   Musculoskeletal: Positive for joint pain.        Right hand and left foot   All other systems reviewed and are negative.      PHYSICAL EXAM   ED Triage Vitals   Enc Vitals Group      BP 09/27/15 1936 149/96 mmHg      Heart Rate 09/27/15 1936 103      Resp Rate 09/27/15 1936 19      Temp 09/27/15 1936 98.7 F (37.1 C)      Temp Source 09/27/15 1936 Oral      SpO2 09/27/15 1936 99 %      Weight 09/27/15 1936 136.669 kg      Height 09/27/15 1936 1.803 m      Head Cir --       Peak Flow --       Pain Score 09/27/15 1936 9      Pain Loc --       Pain Edu? --       Excl. in GC? --        Nursing note and vitals reviewed.   Constitutional:  No acute distress.  Head:  Atraumatic.  Normocephalic.   Eyes:  Normal sclera.  PERRL.    ENT:  Moist mucosa.     Cardiovascular:  Well perfused.  Equal pulses.  Regular rate.  Normal capillary refill.    Pulmonary/Chest:  No respiratory distress.  Airway patent.  No tachypnea.  No accessory muscle usage.    Abdominal:  Non-distended.   obese  Extremities:  No peripheral edema. Moves all extremities equally. Right Hand: + tenderness to palpation right lateral palm just below 5th mcp joint. No ecchymosis, swelling, or deformity of the right hand. No snuff box tenderness. Full flexion and extension of the wrist.  Full flexion and extension of all fingers at the DIP, PIP and MCP joints.  Normal finger abduction, adduction, and thumb opposition.  Normal OK sign. Radial and ulnar pulses are 2+. Normal capillary refill.  Distal sensation is intact.  Skin:  Normal color. No cyanosis or pallor.  Neurological:  Alert, awake, and appropriate.  Normal speech.    Psychological: Normal affect.      MEDICAL DECISION MAKING   xrays to r/o fxs    Xray to r/o fracture or dislocation  Splint applied by technician, and checked by EP. Splint in good position. NV intact      Pt texting  on phone without difficulty.  ambulatory to the ER.  No acute process at this time.    Will d/c    Pt asking for metformin refill. Pt given metformin rx    Pt asking for finger splint. Pt given finger splint    Pt asking for walking boot. Pt does not need walking boot. Instructed to follow up with podiatrist      DISCUSSION          Vital Signs: Reviewed the patient?s vital signs.   Nursing Notes: Reviewed and utilized available nursing notes.  Medical Records Reviewed: Reviewed  available past medical records.  Counseling: The emergency provider has spoken with the patient and discussed today?s findings, in addition to providing specific details for the plan of care.  Questions are answered and there is agreement with the plan.          EMERGENCY IMAGING STUDIES    The following imagine studies were independently interpreted by me (emergency physician):        Radiology:  Exam interpreted by me (ED Physician)  Exam: right hand and left foot  Findings: No fracture. No Dislocation.   Impression: No acute abnormality.     RADIOLOGY IMAGING STUDIES      Foot Left AP Lateral and Oblique    Final Result    No acute fracture or dislocation involving the left foot is   demonstrated.      Merri Ray, MD    09/27/2015 8:48 PM         Hand Right PA Lateral and Oblique   Final Result    Unremarkable radiographs of the right hand.      Merri Ray, MD    09/27/2015 8:42 PM                 PULSE OXIMETRY    Oxygen Saturation by Pulse Oximetry: 99%  Interventions: none  Interpretation:  nml      EMERGENCY DEPT. MEDICATIONS      ED Medication Orders     None          LABORATORY RESULTS    Ordered and independently interpreted AVAILABLE laboratory tests. Please see results section in chart for full details.      CRITICAL CARE/PROCEDURES        DIAGNOSIS      Diagnosis:  Final diagnoses:   Right hand pain   Left foot pain       Disposition:  ED Disposition     Discharge Dorothy [name] discharge to home/self care.    Condition at disposition: Stable            Prescriptions:  Patient's Medications   New Prescriptions    METFORMIN (GLUCOPHAGE) 500 MG TABLET    Take 1 tablet (500 mg total) by mouth 2 (two) times daily with meals.   Previous Medications    ATORVASTATIN CALCIUM PO    Take by mouth.    FREESTYLE TEST STRIPS TEST STRIP    Check blood sugar twice a day.    GABAPENTIN PO    Take by mouth.    GLUCOSE MONITORING KIT (FREESTYLE) MONITORING KIT    use as instructed    INSULIN ASPART (NOVOLOG) 100 UNIT/ML INJECTION    Inject 3 Units into the skin 3 (three) times daily before meals.    INSULIN GLARGINE (LANTUS) 100 UNIT/ML INJECTION    Inject 45 Units into the skin nightly.    LIRAGLUTIDE (VICTOZA) 18 MG/3ML SOLUTION PEN-INJECTOR    Inject 0.3 mLs (1.8 mg total) into the skin daily.    LISINOPRIL (PRINIVIL,ZESTRIL) 20 MG TABLET    Take 20 mg by mouth daily.    METFORMIN (GLUCOPHAGE) 500 MG TABLET    Take 1 tablet (500 mg total) by mouth 2 (two) times daily with meals.    VITAMIN D, ERGOCALCIFEROL, PO    Take by mouth.   Modified Medications    No medications on file   Discontinued Medications    No  medications on file  Loetta Rough, DO  09/27/15 2059

## 2015-09-27 NOTE — ED Notes (Signed)
C/O right hand pain and left foot pain that "has been going on for a while now." Denies injury. Pain (9/10) worsens with extending her fingers and moving her foot

## 2015-09-27 NOTE — Discharge Instructions (Signed)
Arthralgia    You have been diagnosed with Arthralgia.    Arthralgia means pain and stiffness of the joints. People often describe the pain as aching or throbbing. Arthralgia can affect one or more joints. It can be caused by many types of conditions and/or injuries. Often, arthralgia lasts for a long time and people need treatment over months or years. Some causes of arthralgia are:     Infection with a virus.   Many types of infections that are starting to improve. When recovering from infection, sometimes there is joint pain.    Autoimmune diseases (where the body attacks itself). Examples are Lupus or Rheumatoid Arthritis.   Inflammation of the tendons or the fluid-filled sacs (bursa) surrounding your joints.   Low thyroid function.   Depression.     You might need another exam or more tests to find out why you have arthralgias. At this time, the cause of your symptoms does not seem dangerous. You do not need to stay in the hospital.    We don't believe your condition is dangerous right now. However, you need to be careful. Sometimes a problem that seems small can get serious later. This is why it is very important to come back here or go to the nearest Emergency Department unless you are much improved.    Clues that joint pain is dangerous are:     Hot and swollen joints. This may mean they are infected.   Fever (Temperature higher than 100.4F or 38C), weight loss and feeling very ill can be symptoms of severe infection (sepsis).   Severe pain, weakness or numbness (loss of feeling).    Some things you can try at home are:     Over-the-counter pain medications.   Heating pads and warm baths.   Physical therapy.    Follow the instructions for any medication you get prescribed.     Have a close follow-up with your primary care doctor.    YOU SHOULD SEEK MEDICAL ATTENTION IMMEDIATELY, EITHER HERE OR AT THE NEAREST EMERGENCY DEPARTMENT, IF ANY OF THE FOLLOWING OCCURS:     You have a  fever (temperature higher than 100.4F or 38C).   Your pain does not go away or gets worse.   The joint that hurts turns red and/or gets swollen.   You suddenly can t walk.   You don t feel better after treatment or feel you re getting worse.   You get any other symptoms, concerns, or don t get better as expected.    If you can t follow up with your doctor, or if at any time you feel you need to be rechecked or seen again, come back here or go to the nearest emergency department.

## 2015-09-28 ENCOUNTER — Telehealth (INDEPENDENT_AMBULATORY_CARE_PROVIDER_SITE_OTHER): Payer: Self-pay

## 2015-09-28 NOTE — Telephone Encounter (Signed)
What symptoms made you go to the ED? Pain in feet and fingers     How are you feeling today after your recent ED visit? Still having pain     Do you have any new or worsening symptoms since being seen?  Pt in stomach; possible hernia - especially in a.m.    What instructions did they give you at discharge? To follow up podiatrist     Do you have your printed copy of your discharge instructions? Yes      What questions do you have regarding your instructions? None at that      What new medications were prescribed at your visit or what changes to your medications were made? None      What questions do you have regarding your discharge medications? None     Do you have assistance at home and transportation to appointments? N/a - pt reported that she has her own PCP         Advised to follow up as instructed.  Patient / family member verbalizes understanding and has no other questions or concerns.   Instructed to call back if symptoms change or worsen and/or go to the emergency room or call 911 for emergency symptoms.        ED Follow Up CALL: 09/28/15    EDVisit Date: 09/27/15  Primary Discharge ZO:[id number] hand pain  Secondary Dx: Left foot pain   Follow up Appt with PCP: none at this time

## 2015-10-02 ENCOUNTER — Emergency Department: Payer: Medicare Other

## 2015-10-02 ENCOUNTER — Emergency Department
Admission: EM | Admit: 2015-10-02 | Discharge: 2015-10-02 | Disposition: A | Discharge status: Home / Self Care | Payer: Medicare Other | Attending: Emergency Medicine | Admitting: Emergency Medicine

## 2015-10-02 DIAGNOSIS — J45909 Unspecified asthma, uncomplicated: Secondary | ICD-10-CM | POA: Insufficient documentation

## 2015-10-02 DIAGNOSIS — Z91040 Latex allergy status: Secondary | ICD-10-CM | POA: Insufficient documentation

## 2015-10-02 DIAGNOSIS — L03012 Cellulitis of left finger: Secondary | ICD-10-CM | POA: Insufficient documentation

## 2015-10-02 DIAGNOSIS — E119 Type 2 diabetes mellitus without complications: Secondary | ICD-10-CM | POA: Insufficient documentation

## 2015-10-02 DIAGNOSIS — Z794 Long term (current) use of insulin: Secondary | ICD-10-CM | POA: Insufficient documentation

## 2015-10-02 DIAGNOSIS — E7800 Pure hypercholesterolemia, unspecified: Secondary | ICD-10-CM | POA: Insufficient documentation

## 2015-10-02 DIAGNOSIS — Z885 Allergy status to narcotic agent status: Secondary | ICD-10-CM | POA: Insufficient documentation

## 2015-10-02 DIAGNOSIS — M5431 Sciatica, right side: Secondary | ICD-10-CM | POA: Insufficient documentation

## 2015-10-02 MED ORDER — BACITRACIN ZINC 500 UNIT/GM EX OINT
TOPICAL_OINTMENT | Freq: Once | CUTANEOUS | Status: AC
Start: 2015-10-02 — End: 2015-10-02
  Administered 2015-10-02: 1 g via TOPICAL
  Filled 2015-10-02: qty 1.8

## 2015-10-02 MED ORDER — PREDNISONE 20 MG PO TABS
60.0000 mg | ORAL_TABLET | Freq: Every day | ORAL | Status: AC
Start: 2015-10-02 — End: 2015-10-07

## 2015-10-02 MED ORDER — HYDROCODONE-ACETAMINOPHEN 5-325 MG PO TABS
1.0000 | ORAL_TABLET | ORAL | Status: DC | PRN
Start: 2015-10-02 — End: 2017-08-10

## 2015-10-02 MED ORDER — PREDNISONE 20 MG PO TABS
100.0000 mg | ORAL_TABLET | Freq: Once | ORAL | Status: AC
Start: 2015-10-02 — End: 2015-10-02
  Administered 2015-10-02: 100 mg via ORAL
  Filled 2015-10-02: qty 5

## 2015-10-02 MED ORDER — HYDROCODONE-ACETAMINOPHEN 5-325 MG PO TABS
1.0000 | ORAL_TABLET | Freq: Once | ORAL | Status: AC
Start: 2015-10-02 — End: 2015-10-02
  Administered 2015-10-02: 1 via ORAL
  Filled 2015-10-02: qty 1

## 2015-10-02 NOTE — ED Notes (Signed)
Bed: E13  Expected date:   Expected time:   Means of arrival:   Comments:  medic

## 2015-10-02 NOTE — ED Provider Notes (Signed)
Physician/Midlevel provider first contact with patient: 10/02/15 1810         History   No chief complaint on file.    HPI     Time seen: 41    Historian: patient    Chief complaint: right buttock pain    HPI: the pt p/w the gradual onset of the above problem starting one day ago with   no trauma    Time of onset: one day ago    Severity: moderate-severe    Quality: aching pain    Improved by: rest    Worsened by: movement    Accompanied by: no fever/chills; no radiation; no weakness/numbness    Complaint experienced before: no    Add: left long finger nailbed infection for several days starting after a manicure    Past Medical History   Diagnosis Date   . Diabetes mellitus without complication    . Asthma without status asthmaticus    . Anemia    . Depression    . Anemia    . Panic attack    . Seasonal allergies    . High cholesterol      Past Surgical History   Procedure Laterality Date   . Cesarean section     . Cholecystectomy     . Tonsillectomy     . Hernia repair       Family History   Problem Relation Age of Onset   . Diabetes Mother    . Diabetes Sister    . Diabetes Maternal Aunt    . Hypertension Paternal Grandmother    . No known problems Daughter    . No known problems Son    . No known problems Daughter    . No known problems Son      Social  Social History   Substance Use Topics   . Smoking status: Never Smoker    . Smokeless tobacco: Not on file   . Alcohol Use: No   .   Allergies   Allergen Reactions   . Latex    . Morphine Itching     Generalized bumps with itchiness   . Percocet [Oxycodone-Acetaminophen]      Unknown reaction     . Tape      Home Medications             ATORVASTATIN CALCIUM PO     Take by mouth.     FREESTYLE TEST STRIPS test strip     Check blood sugar twice a day.     GABAPENTIN PO     Take by mouth.     glucose monitoring kit (FREESTYLE) monitoring kit     use as instructed     insulin aspart (NOVOLOG) 100 UNIT/ML injection     Inject 3 Units into the skin 3 (three) times  daily before meals.     insulin glargine (LANTUS) 100 UNIT/ML injection     Inject 45 Units into the skin nightly.     liraglutide (VICTOZA) 18 MG/3ML Solution Pen-injector     Inject 0.3 mLs (1.8 mg total) into the skin daily.     lisinopril (PRINIVIL,ZESTRIL) 20 MG tablet     Take 20 mg by mouth daily.     metFORMIN (GLUCOPHAGE) 500 MG tablet     Take 1 tablet (500 mg total) by mouth 2 (two) times daily with meals.     metFORMIN (GLUCOPHAGE) 500 MG tablet     Take 1 tablet (500 mg  total) by mouth 2 (two) times daily with meals.     VITAMIN D, ERGOCALCIFEROL, PO     Take by mouth.        Ongoing Comment    Katherene Ponto RN    09/13/2012  4:37 PM    Unknown medications         Review of Systems   Constitutional: Negative for fever and chills.   Respiratory: Negative for shortness of breath.    Cardiovascular: Negative for chest pain.   Gastrointestinal: Negative for abdominal pain and constipation.   Genitourinary: Positive for difficulty urinating.   Musculoskeletal: Positive for back pain. Negative for joint swelling, arthralgias and neck pain.   Skin: Positive for wound.   Neurological: Negative for weakness, numbness and headaches.     Physical Exam    BP: 148/71 mmHg, Heart Rate: 97, Temp: 98.5 F (36.9 C), Resp Rate: 18, SpO2: 95 %    Physical Exam   Constitutional: She is oriented to person, place, and time. She appears well-developed. She appears distressed.   Distress from right buttock pain   Neck: Normal range of motion. Neck supple.   Pulmonary/Chest: No respiratory distress.   Abdominal: There is no tenderness.   Musculoskeletal: She exhibits tenderness.   Neurological: She is alert and oriented to person, place, and time. She has normal reflexes. No cranial nerve deficit. She exhibits normal muscle tone.   Skin: Skin is warm and dry. She is not diaphoretic.   Tender swelling on side of LLF nail with greenish color,   with NVT-wnl   Nursing note and vitals reviewed.    The patient is not tender  over the lumbar spine in the midline or over the PSM's;   the sacrum and coccyx are nontender; the right buttock is tender; the hips   and LE's are normal; SLR's are negative; DTR's are +2/=; NVT-wnl; there are   no skin changes; there is no swelling; the patient's pain is increased with   active motion    MDM and ED Course     ED Medication Orders     Start Ordered     Status Ordering Provider    10/02/15 2140 10/02/15 2139  HYDROcodone-acetaminophen (NORCO) 5-325 MG per tablet 1 tablet   Once     Route: Oral  Ordered Dose: 1 tablet     Last MAR action:  Given Terri Skains    10/02/15 2135 10/02/15 2134  bacitracin zinc ointment   Once in ED     Route: Topical     Last MAR action:  Given Terri Skains    10/02/15 2135 10/02/15 2134  predniSONE (DELTASONE) tablet 100 mg   Once     Route: Oral  Ordered Dose: 100 mg     Last MAR action:  Given Terri Skains    10/02/15 1907 10/02/15 1906  HYDROcodone-acetaminophen (NORCO) 5-325 MG per tablet 1 tablet   Once     Route: Oral  Ordered Dose: 1 tablet     Last MAR action:  Given Sagal Gayton G         The patient will not be driving after being discharged from the ED    MDM  Number of Diagnoses or Management Options  Paronychia, left:   Sciatica of right side:   Risk of Complications, Morbidity, and/or Mortality  Presenting problems: low  Diagnostic procedures: low  Management options: low    Patient Progress  Patient progress: improved  Incision/Drainage  Date/Time: 10/05/2015 4:52 PM  Performed by: Terri Skains  Authorized by: Terri Skains    Consent:     Consent obtained:  Verbal  Location:     Type:  Abscess    Location:  Upper extremity    Upper extremity location:  L long finger  Pre-procedure details:     Skin preparation:  Antiseptic wash  Anesthesia (see MAR for exact dosages):     Anesthesia method:  Local infiltration    Local anesthetic:  Lidocaine 1% w/o epi  Procedure details:     Complexity:  Simple    Incision types:  Single  straight    Scalpel blade:  15    Wound management:  Probed and deloculated    Drainage:  Purulent    Drainage amount:  Moderate    Wound treatment:  Wound left open    Packing materials:  None  Post-procedure details:     Patient tolerance of procedure:  Tolerated well, no immediate complications    Clinical Impression & Disposition     Clinical Impression  Final diagnoses:   Paronychia, left   Sciatica of right side      ED Disposition     Discharge Allegra Grana discharge to home/self care.    Condition at disposition: Stable                      Terri Skains, MD  10/05/15 1655

## 2015-10-02 NOTE — Discharge Instructions (Signed)
Study results to go  Activity as tolerated  Advil/Motrin/Ibuprofen - 600 mg every eight (8) hours for mild pain  Tylenol - 500 mg every four (4) hours for mild pain  Norco for moderate-severe pain  Prednisone for five more days  Soak finger in hot water three times per day for three days  Fluids-keep hydrated  Return if you are not improving or are worse   Consider MRI as outpatient @ [phone]    Paronychia    You have been diagnosed with a paronychia.    A paronychia is an infection in the skin on the side of a fingernail or toenail.    The tissue next to the nail is usually warm, red, swollen and painful. It may contain pus.    It is usually treated by making a small cut (incision) in the skin next to the nail. This is made to drain the pus. Sometimes, a small part of the nail must also be taken out.    Normally, treatment only involves cutting into the skin and draining the pus, but antibiotics are sometimes prescribed.    Sometimes a paronychia may not have developed a pus pocket. When this happens, treatment is with antibiotics and moist soaks.    Patients often ask for antibiotics. If an incision (cut in the skin) has been made and the pus drained, antibiotics are often not needed. Your doctor will decide.    Take off old dressings every day. Put on a clean, dry dressing. If the dressing sticks to the wound, slightly moisten it with water. This way, it can come off more easily.    YOU SHOULD SEEK MEDICAL ATTENTION IMMEDIATELY, EITHER HERE OR AT THE NEAREST EMERGENCY DEPARTMENT, IF ANY OF THE FOLLOWING OCCURS:   You see redness or swelling.   There are red streaks going up the arm or leg.   The wound smells bad or has a lot of drainage.   It hurts when you move the extremity and/or you have swollen lymph nodes.   You have fever (temperature higher than 100.69F / 38C), chills, worse pain and / or swelling.

## 2015-10-02 NOTE — ED Notes (Signed)
PT states that she has R hip pain x 1 day and claims to have "an infection of her nail on the left middle digit" since Sunday.

## 2015-10-05 ENCOUNTER — Emergency Department
Admission: EM | Admit: 2015-10-05 | Discharge: 2015-10-05 | Disposition: A | Discharge status: Home / Self Care | Payer: Medicare Other | Attending: Emergency Medicine | Admitting: Emergency Medicine

## 2015-10-05 ENCOUNTER — Emergency Department: Payer: Medicare Other

## 2015-10-05 DIAGNOSIS — Z794 Long term (current) use of insulin: Secondary | ICD-10-CM | POA: Insufficient documentation

## 2015-10-05 DIAGNOSIS — Z7984 Long term (current) use of oral hypoglycemic drugs: Secondary | ICD-10-CM | POA: Insufficient documentation

## 2015-10-05 DIAGNOSIS — L03012 Cellulitis of left finger: Secondary | ICD-10-CM | POA: Insufficient documentation

## 2015-10-05 DIAGNOSIS — E119 Type 2 diabetes mellitus without complications: Secondary | ICD-10-CM | POA: Insufficient documentation

## 2015-10-05 DIAGNOSIS — J45909 Unspecified asthma, uncomplicated: Secondary | ICD-10-CM | POA: Insufficient documentation

## 2015-10-05 MED ORDER — CEPHALEXIN 500 MG PO CAPS: 500.0000 mg | ORAL_CAPSULE | Freq: Four times a day (QID) | ORAL | Status: AC

## 2015-10-05 MED ORDER — CEPHALEXIN 250 MG PO CAPS
500.0000 mg | ORAL_CAPSULE | Freq: Once | ORAL | Status: AC
Start: 2015-10-05 — End: 2015-10-05
  Administered 2015-10-05: 500 mg via ORAL
  Filled 2015-10-05: qty 2

## 2015-10-05 MED ORDER — BACITRACIN ZINC 500 UNIT/GM EX OINT
TOPICAL_OINTMENT | Freq: Once | CUTANEOUS | Status: AC
Start: 2015-10-05 — End: 2015-10-05
  Administered 2015-10-05: 1 g via TOPICAL
  Filled 2015-10-05: qty 0.9

## 2015-10-05 NOTE — Discharge Instructions (Signed)
Medications as prescribed.   Tylenol and motrin as needed for pain or fever.    Follow up with PCP.   Return to ED for any new or worsening symptoms or concerns.       Paronychia    You have been diagnosed with a paronychia.    A paronychia is an infection in the skin on the side of a fingernail or toenail.    The tissue next to the nail is usually warm, red, swollen and painful. It may contain pus.    It is usually treated by making a small cut (incision) in the skin next to the nail. This is made to drain the pus. Sometimes, a small part of the nail must also be taken out.    Normally, treatment only involves cutting into the skin and draining the pus, but antibiotics are sometimes prescribed.    Sometimes a paronychia may not have developed a pus pocket. When this happens, treatment is with antibiotics and moist soaks.    Patients often ask for antibiotics. If an incision (cut in the skin) has been made and the pus drained, antibiotics are often not needed. Your doctor will decide.    Take off old dressings every day. Put on a clean, dry dressing. If the dressing sticks to the wound, slightly moisten it with water. This way, it can come off more easily.    YOU SHOULD SEEK MEDICAL ATTENTION IMMEDIATELY, EITHER HERE OR AT THE NEAREST EMERGENCY DEPARTMENT, IF ANY OF THE FOLLOWING OCCURS:   You see redness or swelling.   There are red streaks going up the arm or leg.   The wound smells bad or has a lot of drainage.   It hurts when you move the extremity and/or you have swollen lymph nodes.   You have fever (temperature higher than 100.52F / 38C), chills, worse pain and / or swelling.

## 2015-10-07 ENCOUNTER — Telehealth (INDEPENDENT_AMBULATORY_CARE_PROVIDER_SITE_OTHER): Payer: Self-pay

## 2015-10-07 NOTE — Telephone Encounter (Signed)
LMTCB     Please have pt schedule a follow up office visit with Dr. Clotilde Dieter at their earliest convenience.

## 2015-10-12 NOTE — ED Provider Notes (Signed)
EMERGENCY DEPARTMENT NOTE    Physician/Midlevel provider first contact with patient: 10/05/15 1455         HISTORY OF PRESENT ILLNESS   Historian:Patient  Translator Used: No    Chief Complaint: Hand Pain     Mechanism of Injury: denies      40 y.o. female with c/o increased swelling, pain and bruising to left middle finger. Reports she was seen on 10/02/15 for a paronychia and had it drained, concerned that it needs to re-drained and infected. Reports symptoms started shortly after having a manicure.  Denies fever. Denies any additional injury or trauma.    1. Location of symptoms: left middle finger  2. Onset of symptoms: 4-5 days ago  3. What was patient doing when symptoms started (Context): see above  4. Severity: moderate  5. Timing: constant  6. Activities that worsen symptoms: palpitation  7. Activities that improve symptoms: nothing tried  8. Quality: tender, thorbbing  9. Radiation of symptoms: no  10. Associated signs and Symptoms: see above  11. Are symptoms worsening? yes  MEDICAL HISTORY     Past Medical History:  Past Medical History   Diagnosis Date   . Diabetes mellitus without complication    . Asthma without status asthmaticus    . Anemia    . Depression    . Anemia    . Panic attack    . Seasonal allergies        Past Surgical History:  Past Surgical History   Procedure Laterality Date   . Cesarean section     . Cholecystectomy     . Tonsillectomy     . Hernia repair         Social History:  Social History     Social History   . Marital Status: Divorced     Spouse Name: N/A   . Number of Children: N/A   . Years of Education: N/A     Occupational History   . Not on file.     Social History Main Topics   . Smoking status: Never Smoker    . Smokeless tobacco: Not on file   . Alcohol Use: No   . Drug Use: No   . Sexual Activity: Not on file     Other Topics Concern   . Not on file     Social History Narrative       Family History:  Family History   Problem Relation Age of Onset   . Diabetes Mother    .  Diabetes Sister    . Diabetes Maternal Aunt    . Hypertension Paternal Grandmother    . No known problems Daughter    . No known problems Son    . No known problems Daughter    . No known problems Son        Outpatient Medication:  Discharge Medication List as of 10/05/2015  4:05 PM      CONTINUE these medications which have NOT CHANGED    Details   ATORVASTATIN CALCIUM PO Take by mouth., Until Discontinued, Historical Med      FREESTYLE TEST STRIPS test strip Check blood sugar twice a day., Print      GABAPENTIN PO Take by mouth., Until Discontinued, Historical Med      glucose monitoring kit (FREESTYLE) monitoring kit use as instructed, Print      HYDROcodone-acetaminophen (NORCO) 5-325 MG per tablet Take 1 tablet by mouth every 4 (four) hours as needed for  Pain., Starting 10/02/2015, Until Discontinued, Print      insulin aspart (NOVOLOG) 100 UNIT/ML injection Inject 3 Units into the skin 3 (three) times daily before meals., Starting 09/02/2015, Until Discontinued, No Print      insulin glargine (LANTUS) 100 UNIT/ML injection Inject 45 Units into the skin nightly., Starting 09/02/2015, Until Discontinued, Print      liraglutide (VICTOZA) 18 MG/3ML Solution Pen-injector Inject 0.3 mLs (1.8 mg total) into the skin daily., Starting 09/18/2015, Until Discontinued, Print      lisinopril (PRINIVIL,ZESTRIL) 20 MG tablet Take 20 mg by mouth daily., Until Discontinued, Historical Med      metFORMIN (GLUCOPHAGE) 500 MG tablet Take 1 tablet (500 mg total) by mouth 2 (two) times daily with meals., Starting 09/18/2015, Until Sun 10/18/15, Print      predniSONE (DELTASONE) 20 MG tablet Take 3 tablets (60 mg total) by mouth daily., Starting 10/02/2015, Until Wed 10/07/15, Print      VITAMIN D, ERGOCALCIFEROL, PO Take by mouth., Until Discontinued, Historical Med               REVIEW OF SYSTEMS   Review of Systems   Constitutional: Negative for fever and chills.   Musculoskeletal:        Left hand, middle finger   All other systems  reviewed and are negative.       PHYSICAL EXAM   ED Triage Vitals   Enc Vitals Group      BP 10/05/15 1458 156/77 mmHg      Heart Rate 10/05/15 1458 88      Resp Rate 10/05/15 1458 18      Temp 10/05/15 1458 97.7 F (36.5 C)      Temp src --       SpO2 10/05/15 1458 98 %      Weight 10/05/15 1458 137.44 kg      Height 10/05/15 1458 1.803 m      Head Cir --       Peak Flow --       Pain Score 10/05/15 1544 10      Pain Loc --       Pain Edu? --       Excl. in GC? --      Physical Exam   Constitutional: She is oriented to person, place, and time. She appears well-developed and well-nourished. No distress.   HENT:   Head: Normocephalic and atraumatic.   Eyes: Conjunctivae are normal.   Cardiovascular: Normal rate.    Musculoskeletal:        Left hand: She exhibits tenderness and swelling. She exhibits normal range of motion, no bony tenderness, normal capillary refill, no deformity and no laceration. Normal sensation noted. Normal strength noted.        Hands:  Neurological: She is alert and oriented to person, place, and time.   Skin: Skin is warm and dry.   Nursing note and vitals reviewed.            MEDICAL DECISION MAKING     DISCUSSION      Paronychia to left middle finger, small amount of swelling noted to radial side of middle finger with dried blood noted. No warmth noted. No indication for additonal I&D at this time. Will start on antibiotics and warm compresses. Medications as prescribed. Tylenol and motrin as needed for pain or fever.  Follow up with PCP. Return to ED for any new or worsening symptoms or concerns. Plan of care discussed with patient,  patient agreeable.      Vital Signs: Reviewed the patient?s vital signs.   Nursing Notes: Reviewed and utilized available nursing notes.  Medical Records Reviewed: Reviewed available past medical records.  Counseling: The emergency provider has spoken with the patient and discussed today?s findings, in addition to providing specific details for the plan of  care.  Questions are answered and there is agreement with the plan.        PULSE OXIMETRY    Oxygen Saturation by Pulse Oximetry: 98%  Interventions: none  Interpretation:  normal    EMERGENCY DEPT. MEDICATIONS      ED Medication Orders     Start Ordered     Status Ordering Provider    10/05/15 1610 10/05/15 1609  bacitracin zinc ointment   Once     Route: Topical     Last MAR action:  Given Rowan Pollman N    10/05/15 1605 10/05/15 1604  cephALEXin (KEFLEX) capsule 500 mg   Once     Route: Oral  Ordered Dose: 500 mg     Last MAR action:  Given Malli Falotico N          LABORATORY RESULTS    Ordered and independently interpreted AVAILABLE laboratory tests. Please see results section in chart for full details.  Results for orders placed or performed during the hospital encounter of 09/18/15   CBC with differential   Result Value Ref Range    WBC 7.18 3.50 - 10.80 x10 3/uL    Hgb 11.8 (L) 12.0 - 16.0 g/dL    Hematocrit 66.0 (L) 37.0 - 47.0 %    Platelets 357 140 - 400 x10 3/uL    RBC 4.44 4.20 - 5.40 x10 6/uL    MCV 77.9 (L) 80.0 - 100.0 fL    MCH 26.6 (L) 28.0 - 32.0 pg    MCHC 34.1 32.0 - 36.0 g/dL    RDW 15 12 - 15 %    MPV 9.7 9.4 - 12.3 fL    Neutrophils 56 None %    Lymphocytes Automated 34 None %    Monocytes 6 None %    Eosinophils Automated 3 None %    Basophils Automated 0 None %    Immature Granulocyte 0 None %    Nucleated RBC 0 0 - 1 /100 WBC    Neutrophils Absolute 4.05 1.80 - 8.10 x10 3/uL    Abs Lymph Automated 2.43 0.50 - 4.40 x10 3/uL    Abs Mono Automated 0.47 0.00 - 1.20 x10 3/uL    Abs Eos Automated 0.21 0.00 - 0.70 x10 3/uL    Absolute Baso Automated 0.02 0.00 - 0.20 x10 3/uL    Absolute Immature Granulocyte 0.03 0 x10 3/uL   Basic Metabolic Panel (BMP)   Result Value Ref Range    Glucose 332 (H) 70 - 100 mg/dL    BUN 11 7 - 19 mg/dL    Creatinine 0.7 0.6 - 1.0 mg/dL    Calcium 9.5 8.5 - 63.0 mg/dL    Sodium 160 (L) 109 - 145 mEq/L    Potassium 4.2 3.5 - 5.1 mEq/L    Chloride 99 (L) 100 -  111 mEq/L    CO2 23 22 - 29 mEq/L    Anion Gap 13.0 5.0 - 15.0   UA, Reflex to Microscopic   Result Value Ref Range    Urine Type Clean Catch     Color, UA Yellow Clear - Yellow    Clarity,  UA Cloudy (A) Clear - Hazy    Specific Gravity UA >1.035 (A) 1.001-1.035    Urine pH 6.0 5.0-8.0    Leukocyte Esterase, UA Small (A) Negative    Nitrite, UA Negative Negative    Protein, UR Negative Negative    Glucose, UA >=500 (A) Negative    Ketones UA Negative Negative    Urobilinogen, UA Negative 0.2  -  2.0 mg/dL    Bilirubin, UA Negative Negative    Blood, UA Negative Negative    RBC, UA 6 - 10 (A) 0 - 5 /hpf    WBC, UA 0 - 5 0 - 5 /hpf    Squamous Epithelial Cells, Urine 11 - 25 0 - 25 /hpf    Yeast, UA Few (A) None   GFR   Result Value Ref Range    EGFR >60.0    Glucose Whole Blood - POCT   Result Value Ref Range    POCT - Glucose Whole blood 351 (H) 70 - 100 mg/dL         DIAGNOSIS      Diagnosis:  Final diagnoses:   Paronychia, left       Disposition:  ED Disposition     Discharge Dorothy [name] discharge to home/self care.    Condition at disposition: Stable            Prescriptions:  Discharge Medication List as of 10/05/2015  4:05 PM      START taking these medications    Details   cephalexin (KEFLEX) 500 MG capsule Take 1 capsule (500 mg total) by mouth 4 (four) times daily., Starting 10/05/2015, Until Mon 10/12/15, Print         CONTINUE these medications which have NOT CHANGED    Details   ATORVASTATIN CALCIUM PO Take by mouth., Until Discontinued, Historical Med      FREESTYLE TEST STRIPS test strip Check blood sugar twice a day., Print      GABAPENTIN PO Take by mouth., Until Discontinued, Historical Med      glucose monitoring kit (FREESTYLE) monitoring kit use as instructed, Print      HYDROcodone-acetaminophen (NORCO) 5-325 MG per tablet Take 1 tablet by mouth every 4 (four) hours as needed for Pain., Starting 10/02/2015, Until Discontinued, Print      insulin aspart (NOVOLOG) 100 UNIT/ML injection Inject 3  Units into the skin 3 (three) times daily before meals., Starting 09/02/2015, Until Discontinued, No Print      insulin glargine (LANTUS) 100 UNIT/ML injection Inject 45 Units into the skin nightly., Starting 09/02/2015, Until Discontinued, Print      liraglutide (VICTOZA) 18 MG/3ML Solution Pen-injector Inject 0.3 mLs (1.8 mg total) into the skin daily., Starting 09/18/2015, Until Discontinued, Print      lisinopril (PRINIVIL,ZESTRIL) 20 MG tablet Take 20 mg by mouth daily., Until Discontinued, Historical Med      metFORMIN (GLUCOPHAGE) 500 MG tablet Take 1 tablet (500 mg total) by mouth 2 (two) times daily with meals., Starting 09/18/2015, Until Sun 10/18/15, Print      predniSONE (DELTASONE) 20 MG tablet Take 3 tablets (60 mg total) by mouth daily., Starting 10/02/2015, Until Wed 10/07/15, Print      VITAMIN D, ERGOCALCIFEROL, PO Take by mouth., Until Discontinued, Historical Med                 Daune Perch, NP  10/12/15 1121

## 2016-05-16 ENCOUNTER — Encounter (HOSPITAL_COMMUNITY): Payer: Self-pay | Admitting: Emergency Medicine

## 2016-05-16 DIAGNOSIS — J45909 Unspecified asthma, uncomplicated: Secondary | ICD-10-CM | POA: Insufficient documentation

## 2016-05-16 DIAGNOSIS — M79672 Pain in left foot: Secondary | ICD-10-CM | POA: Insufficient documentation

## 2016-05-16 DIAGNOSIS — M79622 Pain in left upper arm: Secondary | ICD-10-CM | POA: Insufficient documentation

## 2016-05-16 DIAGNOSIS — M25572 Pain in left ankle and joints of left foot: Secondary | ICD-10-CM | POA: Diagnosis not present

## 2016-05-16 DIAGNOSIS — G8929 Other chronic pain: Secondary | ICD-10-CM | POA: Diagnosis present

## 2016-05-16 DIAGNOSIS — E119 Type 2 diabetes mellitus without complications: Secondary | ICD-10-CM | POA: Insufficient documentation

## 2016-05-16 DIAGNOSIS — M25571 Pain in right ankle and joints of right foot: Secondary | ICD-10-CM | POA: Insufficient documentation

## 2016-05-16 LAB — CBC
HCT: 37.1 % (ref 36.0–46.0)
Hemoglobin: 13.4 g/dL (ref 12.0–15.0)
MCH: 28.5 pg (ref 26.0–34.0)
MCHC: 36.1 g/dL — ABNORMAL HIGH (ref 30.0–36.0)
MCV: 78.9 fL (ref 78.0–100.0)
PLATELETS: 381 10*3/uL (ref 150–400)
RBC: 4.7 MIL/uL (ref 3.87–5.11)
RDW: 13.6 % (ref 11.5–15.5)
WBC: 7.3 10*3/uL (ref 4.0–10.5)

## 2016-05-16 LAB — URINALYSIS, ROUTINE W REFLEX MICROSCOPIC
Bilirubin Urine: NEGATIVE
HGB URINE DIPSTICK: NEGATIVE
Ketones, ur: 20 mg/dL — AB
LEUKOCYTES UA: NEGATIVE
Nitrite: NEGATIVE
PH: 6 (ref 5.0–8.0)
Protein, ur: NEGATIVE mg/dL
Specific Gravity, Urine: 1.025 (ref 1.005–1.030)

## 2016-05-16 LAB — BASIC METABOLIC PANEL
Anion gap: 9 (ref 5–15)
BUN: 16 mg/dL (ref 6–20)
CALCIUM: 9.1 mg/dL (ref 8.9–10.3)
CHLORIDE: 97 mmol/L — AB (ref 101–111)
CO2: 23 mmol/L (ref 22–32)
CREATININE: 0.81 mg/dL (ref 0.44–1.00)
GFR calc non Af Amer: >60 mL/min (ref >60)
Glucose, Bld: 429 mg/dL — ABNORMAL HIGH (ref 65–99)
Potassium: 4.6 mmol/L (ref 3.5–5.1)
SODIUM: 129 mmol/L — AB (ref 135–145)

## 2016-05-16 NOTE — ED Triage Notes (Signed)
Per [facility] pt from home c/o  Bilateral ankle, chest and left elbow pain for past 2 months. Pt reports chest wall pain with palpitation after coughing. Nausea onset this am.

## 2016-05-17 ENCOUNTER — Emergency Department (HOSPITAL_COMMUNITY)
Admission: EM | Admit: 2016-05-17 | Discharge: 2016-05-17 | Disposition: A | Discharge status: Home / Self Care | Payer: Medicare Other | Attending: Emergency Medicine | Admitting: Emergency Medicine

## 2016-05-17 DIAGNOSIS — G8929 Other chronic pain: Secondary | ICD-10-CM

## 2016-05-17 HISTORY — DX: Type 2 diabetes mellitus without complications: E11.9

## 2016-05-17 HISTORY — DX: Unspecified asthma, uncomplicated: J45.909

## 2016-05-17 MED ORDER — KETOROLAC TROMETHAMINE 60 MG/2ML IM SOLN
60.0000 mg | Freq: Once | INTRAMUSCULAR | Status: AC
  Administered 2016-05-17: 60 mg via INTRAMUSCULAR
  Filled 2016-05-17: qty 2

## 2016-05-17 NOTE — ED Provider Notes (Signed)
[facility]-EMERGENCY DEPT Provider Note   CSN: [id number] Arrival date & time: 05/16/16  1939 By signing my name below, I, Evelyn Watts, attest that this documentation has been prepared under the direction and in the presence of Tomasita CrumbleAdeleke Mearl Harewood, MD. Electronically Signed: Bridgette HabermannMaria Watts, ED Scribe. 05/17/16. 2:26 AM.  History   Chief Complaint Chief Complaint  Patient presents with  . Generalized Body Aches  . Hyperglycemia    HPI The history is provided by the patient. No language interpreter was used.   HPI Comments: Evelyn Schlatteratricia Mahler is a 41 y.o. female with h/o asthma and DM, who presents to the Emergency Department by EMS for multiple complaints.  Pt is complaining of right ankle pain s/p mechanical fall three years ago, worsening yesterday. Pt states she was misdiagnosed with a sprain when she was seen three years ago and it ended up being broken.  Pt is also complaining of left ankle pain following an injury one year ago. She has not been seen for this. Pt is also complaining of left elbow pain beginning 2 months ago. Pain is exacerbated with palpation to the area.  Additionally, pt is complaining of left foot pain beginning several years ago. She has not tried any OTC medications PTA. No recent illness. Pt has been compliant with her medications. Pt denies fever, chills, rhinorrhea, or any other associated symptoms.   Past Medical History:  Diagnosis Date  . Asthma   . Diabetes mellitus without complication (HCC)     There are no active problems to display for this patient.   Past Surgical History:  Procedure Laterality Date  . CESAREAN SECTION     4  . CHOLECYSTECTOMY    . HERNIA REPAIR    . TONSILLECTOMY      OB History    No data available       Home Medications    Prior to Admission medications   Not on File    Family History No family history on file.  Social History Social History  Substance Use Topics  . Smoking status: Never Smoker  . Smokeless tobacco: Not  on file  . Alcohol use No     Allergies   Morphine and related and Percocet [oxycodone-acetaminophen]   Review of Systems Review of Systems  Constitutional: Negative for chills and fever.  Musculoskeletal: Positive for arthralgias and myalgias.  All other systems reviewed and are negative.    Physical Exam Updated Vital Signs BP (!) 136/101 (BP Location: Left Arm)   Pulse 88   Temp 98.1 F (36.7 C) (Oral)   Resp 18   LMP 04/29/2016   SpO2 100%   Physical Exam  Constitutional: She is oriented to person, place, and time. She appears well-developed and well-nourished. No distress.  HENT:  Head: Normocephalic and atraumatic.  Nose: Nose normal.  Mouth/Throat: Oropharynx is clear and moist. No oropharyngeal exudate.  Eyes: Conjunctivae and EOM are normal. Pupils are equal, round, and reactive to light. No scleral icterus.  Neck: Normal range of motion. Neck supple. No JVD present. No tracheal deviation present. No thyromegaly present.  Cardiovascular: Normal rate, regular rhythm and normal heart sounds.  Exam reveals no gallop and no friction rub.   No murmur heard. Pulmonary/Chest: Effort normal and breath sounds normal. No respiratory distress. She has no wheezes. She exhibits no tenderness.  Abdominal: Soft. Bowel sounds are normal. She exhibits no distension and no mass. There is no tenderness. There is no rebound and no guarding.  Musculoskeletal: Normal range  of motion. She exhibits no edema or tenderness.  Lymphadenopathy:    She has no cervical adenopathy.  Neurological: She is alert and oriented to person, place, and time. No cranial nerve deficit. She exhibits normal muscle tone.  Skin: Skin is warm and dry. No rash noted. No erythema. No pallor.  Nursing note and vitals reviewed.    ED Treatments / Results  DIAGNOSTIC STUDIES: Oxygen Saturation is 100% on RA, normal by my interpretation.    COORDINATION OF CARE: 2:24 AM Discussed treatment plan with pt at  bedside and pt agreed to plan.  Labs (all labs ordered are listed, but only abnormal results are displayed) Labs Reviewed  BASIC METABOLIC PANEL - Abnormal; Notable for the following:       Result Value   Sodium 129 (*)    Chloride 97 (*)    Glucose, Bld 429 (*)    All other components within normal limits  CBC - Abnormal; Notable for the following:    MCHC 36.1 (*)    All other components within normal limits  URINALYSIS, ROUTINE W REFLEX MICROSCOPIC - Abnormal; Notable for the following:    Color, Urine STRAW (*)    Glucose, UA >=500 (*)    Ketones, ur 20 (*)    Bacteria, UA RARE (*)    Squamous Epithelial / LPF 0-5 (*)    All other components within normal limits  CBG MONITORING, ED    EKG  EKG Interpretation None       Radiology No results found.  Procedures Procedures (including critical care time)  Medications Ordered in ED Medications - No data to display   Initial Impression / Assessment and Plan / ED Course  I have reviewed the triage vital signs and the nursing notes.  Pertinent labs & imaging results that were available during my care of the patient were reviewed by me and considered in my medical decision making (see chart for details).     Patient presents to the ED for pain for the past 3 years in her ankles and elbow.  Physical exam reveals stable joints without any deformity.  She denies new trauma. She has PCP fu and was advised to call to get a sooner appt.  She was given toradol in the ED for pain control.  Tylenol and ibuprofen recommended at home.  No narcotics given for chronic pain. She appears well and in NAD. VS remain within her normal limits and she is safe for DC.  Final Clinical Impressions(s) / ED Diagnoses   Final diagnoses:  None    New Prescriptions New Prescriptions   No medications on file   I personally performed the services described in this documentation, which was scribed in my presence. The recorded information has  been reviewed and is accurate.       Tomasita Crumble, MD 05/17/16 [phone]

## 2016-05-24 DIAGNOSIS — E66812 Obesity, class 2: Secondary | ICD-10-CM | POA: Insufficient documentation

## 2016-05-24 DIAGNOSIS — F331 Major depressive disorder, recurrent, moderate: Secondary | ICD-10-CM | POA: Insufficient documentation

## 2016-08-27 ENCOUNTER — Encounter (HOSPITAL_COMMUNITY): Payer: Self-pay | Admitting: Emergency Medicine

## 2016-08-27 ENCOUNTER — Emergency Department (HOSPITAL_COMMUNITY)
Admission: EM | Admit: 2016-08-27 | Discharge: 2016-08-27 | Disposition: A | Discharge status: Home / Self Care | Payer: Medicare Other | Attending: Emergency Medicine | Admitting: Emergency Medicine

## 2016-08-27 DIAGNOSIS — N739 Female pelvic inflammatory disease, unspecified: Secondary | ICD-10-CM | POA: Insufficient documentation

## 2016-08-27 DIAGNOSIS — M65051 Abscess of tendon sheath, right thigh: Secondary | ICD-10-CM | POA: Insufficient documentation

## 2016-08-27 DIAGNOSIS — Z794 Long term (current) use of insulin: Secondary | ICD-10-CM | POA: Insufficient documentation

## 2016-08-27 DIAGNOSIS — Z79899 Other long term (current) drug therapy: Secondary | ICD-10-CM | POA: Insufficient documentation

## 2016-08-27 DIAGNOSIS — B373 Candidiasis of vulva and vagina: Secondary | ICD-10-CM | POA: Diagnosis not present

## 2016-08-27 DIAGNOSIS — B3731 Acute candidiasis of vulva and vagina: Secondary | ICD-10-CM

## 2016-08-27 DIAGNOSIS — J45909 Unspecified asthma, uncomplicated: Secondary | ICD-10-CM | POA: Diagnosis not present

## 2016-08-27 DIAGNOSIS — E119 Type 2 diabetes mellitus without complications: Secondary | ICD-10-CM | POA: Insufficient documentation

## 2016-08-27 DIAGNOSIS — R102 Pelvic and perineal pain: Secondary | ICD-10-CM | POA: Diagnosis present

## 2016-08-27 DIAGNOSIS — Z91040 Latex allergy status: Secondary | ICD-10-CM | POA: Insufficient documentation

## 2016-08-27 DIAGNOSIS — N730 Acute parametritis and pelvic cellulitis: Secondary | ICD-10-CM

## 2016-08-27 DIAGNOSIS — L0291 Cutaneous abscess, unspecified: Secondary | ICD-10-CM

## 2016-08-27 LAB — URINALYSIS, ROUTINE W REFLEX MICROSCOPIC
BILIRUBIN URINE: NEGATIVE
Bacteria, UA: NONE SEEN
Glucose, UA: >=500 mg/dL — AB
HGB URINE DIPSTICK: NEGATIVE
KETONES UR: NEGATIVE mg/dL
LEUKOCYTES UA: NEGATIVE
Nitrite: NEGATIVE
PROTEIN: NEGATIVE mg/dL
SPECIFIC GRAVITY, URINE: 1.03 (ref 1.005–1.030)
Squamous Epithelial / LPF: NONE SEEN
pH: 5 (ref 5.0–8.0)

## 2016-08-27 LAB — WET PREP, GENITAL
Sperm: NONE SEEN
Trich, Wet Prep: NONE SEEN
Yeast Wet Prep HPF POC: NONE SEEN

## 2016-08-27 LAB — POC URINE PREG, ED: PREG TEST UR: NEGATIVE

## 2016-08-27 LAB — CBG MONITORING, ED: GLUCOSE-CAPILLARY: 362 mg/dL — AB (ref 65–99)

## 2016-08-27 MED ORDER — DOXYCYCLINE HYCLATE 100 MG PO TABS
100.0000 mg | ORAL_TABLET | Freq: Once | ORAL | Status: AC
  Administered 2016-08-27: 100 mg via ORAL
  Filled 2016-08-27: qty 1

## 2016-08-27 MED ORDER — NYSTATIN 100000 UNIT/GM EX POWD: Freq: Three times a day (TID) | CUTANEOUS | 0 refills | Status: DC

## 2016-08-27 MED ORDER — STERILE WATER FOR INJECTION IJ SOLN
INTRAMUSCULAR | Status: AC
  Administered 2016-08-27: 10 mL
  Filled 2016-08-27: qty 10

## 2016-08-27 MED ORDER — METRONIDAZOLE 500 MG PO TABS: 500.0000 mg | ORAL_TABLET | Freq: Two times a day (BID) | ORAL | 0 refills | Status: AC

## 2016-08-27 MED ORDER — CEFTRIAXONE SODIUM 1 G IJ SOLR
1.0000 g | INTRAMUSCULAR | Status: DC
  Administered 2016-08-27: 1 g via INTRAMUSCULAR
  Filled 2016-08-27: qty 10

## 2016-08-27 MED ORDER — DOXYCYCLINE HYCLATE 100 MG PO CAPS: 100.0000 mg | ORAL_CAPSULE | Freq: Two times a day (BID) | ORAL | 0 refills | Status: AC

## 2016-08-27 MED ORDER — FLUCONAZOLE 150 MG PO TABS: 150.0000 mg | ORAL_TABLET | Freq: Once | ORAL | 0 refills | Status: AC

## 2016-08-27 MED ORDER — NAPROXEN 500 MG PO TABS
500.0000 mg | ORAL_TABLET | Freq: Once | ORAL | Status: AC
  Administered 2016-08-27: 500 mg via ORAL
  Filled 2016-08-27: qty 1

## 2016-08-27 MED ORDER — METRONIDAZOLE 500 MG PO TABS
500.0000 mg | ORAL_TABLET | Freq: Once | ORAL | Status: AC
  Administered 2016-08-27: 500 mg via ORAL
  Filled 2016-08-27: qty 1

## 2016-08-27 MED ORDER — LIDOCAINE-EPINEPHRINE (PF) 2 %-1:200000 IJ SOLN
20.0000 mL | Freq: Once | INTRAMUSCULAR | Status: AC
  Administered 2016-08-27: 20 mL via INTRADERMAL
  Filled 2016-08-27: qty 20

## 2016-08-27 NOTE — ED Provider Notes (Addendum)
[facility]-EMERGENCY DEPT Provider Note   CSN: [id number] Arrival date & time: 08/27/16  1640     History   Chief Complaint Chief Complaint  Patient presents with  . Vaginal Pain  . Abscess    HPI Evelyn Watts is a 41 y.o. female.  HPI 41 yo F with h/o DM here with vaginal pain and leg pain. Regarding her vaginal pain, she endorses moderate white vaginal discharge over past 2-3 days. She has noticed pain with wiping after urination but no pain during urination. She has no history of recent sexaul intercourse. She admits her blood sugars have been high and that she has h/o yeast infections in the past with this. Regarding her leg pain, she endorses a red, painful boil to her right proximal thigh. She has been trying to drain this without relief. She has h/o boils requiring I&D. No fever or chills. No vomiting or diarrhea. No other med complaints.  Past Medical History:  Diagnosis Date  . Asthma   . Diabetes mellitus without complication (HCC)     There are no active problems to display for this patient.   Past Surgical History:  Procedure Laterality Date  . CESAREAN SECTION     4  . CHOLECYSTECTOMY    . HERNIA REPAIR    . TONSILLECTOMY      OB History    No data available       Home Medications    Prior to Admission medications   Medication Sig Start Date End Date Taking? Authorizing Provider  atorvastatin (LIPITOR) 20 MG tablet Take 20 mg by mouth at bedtime.    Yes [provider]  lisinopril (PRINIVIL,ZESTRIL) 10 MG tablet Take 10 mg by mouth daily.   Yes [provider]  metFORMIN (GLUCOPHAGE) 500 MG tablet Take 500 mg by mouth 2 (two) times daily.   Yes [provider]  albuterol (PROVENTIL HFA;VENTOLIN HFA) 108 (90 Base) MCG/ACT inhaler Inhale 1-2 puffs into the lungs every 6 (six) hours as needed for wheezing or shortness of breath. 09/18/16   Ward, Chase Picket, PA-C  fluconazole (DIFLUCAN) 150 MG tablet Take 1 tablet (150 mg total)  by mouth daily. Patient not taking: Reported on 09/18/2016 09/07/16   Deatra Canter, FNP  insulin glargine (LANTUS) 100 UNIT/ML injection Inject 45 Units into the skin daily as needed (for high blood sugar).    [provider]  nystatin (MYCOSTATIN/NYSTOP) powder Apply topically 3 (three) times daily. Patient not taking: Reported on 09/18/2016 09/07/16   Deatra Canter, FNP  nystatin cream (MYCOSTATIN) Apply 1 application topically 2 (two) times daily. Patient not taking: Reported on 09/18/2016 09/07/16   Deatra Canter, FNP    Family History No family history on file.  Social History Social History  Substance Use Topics  . Smoking status: Never Smoker  . Smokeless tobacco: Never Used  . Alcohol use No     Allergies   Latex; Morphine and related; Percocet [oxycodone-acetaminophen]; and Tape   Review of Systems Review of Systems  Constitutional: Positive for fatigue.  Genitourinary: Positive for vaginal discharge and vaginal pain.  Skin: Positive for rash and wound.  All other systems reviewed and are negative.    Physical Exam Updated Vital Signs BP (!) 162/87 (BP Location: Right Arm)   Pulse 78   Resp 18   LMP 08/19/2016   SpO2 99%   Physical Exam  Constitutional: She is oriented to person, place, and time. She appears well-developed and well-nourished. No distress.  HENT:  Head: Normocephalic and atraumatic.  Eyes: Conjunctivae are normal.  Neck: Neck supple.  Cardiovascular: Normal rate, regular rhythm and normal heart sounds.   Pulmonary/Chest: Effort normal. No respiratory distress. She has no wheezes.  Abdominal: She exhibits no distension.  Musculoskeletal: She exhibits no edema.  Neurological: She is alert and oriented to person, place, and time. She exhibits normal muscle tone.  Skin: Skin is warm. Capillary refill takes less than 2 seconds. No rash noted.  Nursing note and vitals reviewed.    ED Treatments / Results  Labs (all labs  ordered are listed, but only abnormal results are displayed) Labs Reviewed  WET PREP, GENITAL - Abnormal; Notable for the following:       Result Value   Clue Cells Wet Prep HPF POC PRESENT (*)    WBC, Wet Prep HPF POC MODERATE (*)    All other components within normal limits  URINALYSIS, ROUTINE W REFLEX MICROSCOPIC - Abnormal; Notable for the following:    Color, Urine STRAW (*)    Glucose, UA >=500 (*)    All other components within normal limits  CBG MONITORING, ED - Abnormal; Notable for the following:    Glucose-Capillary 362 (*)    All other components within normal limits  POC URINE PREG, ED  GC/CHLAMYDIA PROBE AMP ([place]) NOT AT [facility]    EKG  EKG Interpretation None       Radiology No results found.  Procedures .Marland KitchenIncision and Drainage Date/Time: 09/20/2016 10:07 PM Performed by: Shaune Pollack Authorized by: Shaune Pollack   Consent:    Consent obtained:  Verbal   Consent given by:  Patient   Risks discussed:  Bleeding, infection, incomplete drainage and pain   Alternatives discussed:  Delayed treatment and alternative treatment Location:    Type:  Abscess   Location: right thigh. Pre-procedure details:    Skin preparation:  Betadine Anesthesia (see MAR for exact dosages):    Anesthesia method:  Local infiltration   Local anesthetic:  Lidocaine 1% WITH epi Procedure type:    Complexity:  Simple Procedure details:    Needle aspiration: no     Incision types:  Single straight   Incision depth:  Dermal   Scalpel blade:  11   Wound management:  Probed and deloculated   Drainage:  Purulent   Drainage amount:  Moderate   Wound treatment:  Wound left open   Packing materials:  None Post-procedure details:    Patient tolerance of procedure:  Tolerated well, no immediate complications   (including critical care time)  Medications Ordered in ED Medications  lidocaine-EPINEPHrine (XYLOCAINE W/EPI) 2 %-1:200000 (PF) injection 20 mL (20 mLs  Intradermal Given 08/27/16 1708)  doxycycline (VIBRA-TABS) tablet 100 mg (100 mg Oral Given 08/27/16 1808)  metroNIDAZOLE (FLAGYL) tablet 500 mg (500 mg Oral Given 08/27/16 1809)  naproxen (NAPROSYN) tablet 500 mg (500 mg Oral Given 08/27/16 1809)  sterile water (preservative free) injection (10 mLs  Given 08/27/16 1808)     Initial Impression / Assessment and Plan / ED Course  I have reviewed the triage vital signs and the nursing notes.  Pertinent labs & imaging results that were available during my care of the patient were reviewed by me and considered in my medical decision making (see chart for details).     41 yo F with PMHx as above including chronic, poorly-controlled Dm here with vaginal pain and right thigh pain. Thigh pain is 2/2 local folliculitis, with possible component of chronic hydradenitis.  Abscess I&D as above with small amount of drainage. No surrounding induration, crepitance, no fevers, do not suspect nec fasc/deep infection. Regarding her vaginal pain, she has marked CMT, yellow-white discharge c/f PID. No vulvar crepitance or signs of abscess. Wet prep + clue cells, WBCs, no trich. Will treat with doxy/flagyl, advise adherence with insulin regimen, and d/c. Glu <400, no ketones in urine. No evidence of acute sepsis/systemic infection.  Final Clinical Impressions(s) / ED Diagnoses   Final diagnoses:  Abscess  Vaginal candidiasis  PID (acute pelvic inflammatory disease)    New Prescriptions Discharge Medication List as of 08/27/2016  6:02 PM    START taking these medications   Details  doxycycline (VIBRAMYCIN) 100 MG capsule Take 1 capsule (100 mg total) by mouth 2 (two) times daily., Starting Sat 08/27/2016, Until Sat 09/10/2016, Print    fluconazole (DIFLUCAN) 150 MG tablet Take 1 tablet (150 mg total) by mouth once., Starting Sat 08/27/2016, Print    metroNIDAZOLE (FLAGYL) 500 MG tablet Take 1 tablet (500 mg total) by mouth 2 (two) times daily., Starting Sat 08/27/2016, Until  Sat 09/10/2016, Print    nystatin (MYCOSTATIN/NYSTOP) powder Apply topically 3 (three) times daily., Starting Sat 08/27/2016, Print         Shaune PollackIsaacs, Inara Dike, MD 08/27/16 [id number]    Shaune PollackIsaacs, Harley Fitzwater, MD 09/20/16 2209

## 2016-08-27 NOTE — ED Triage Notes (Signed)
Patient in with complaints of vaginal pain. Reports that the pain coming from her hernia is radiating down into pelvis. Also reports abscess to right thigh.

## 2016-08-29 LAB — GC/CHLAMYDIA PROBE AMP (~~LOC~~) NOT AT ARMC
Chlamydia: NEGATIVE
Neisseria Gonorrhea: NEGATIVE

## 2016-09-02 ENCOUNTER — Emergency Department (HOSPITAL_COMMUNITY)
Admission: EM | Admit: 2016-09-02 | Discharge: 2016-09-03 | Disposition: A | Discharge status: Home / Self Care | Payer: Medicare Other | Attending: Emergency Medicine | Admitting: Emergency Medicine

## 2016-09-02 ENCOUNTER — Encounter (HOSPITAL_COMMUNITY): Payer: Self-pay | Admitting: Nurse Practitioner

## 2016-09-02 ENCOUNTER — Emergency Department (HOSPITAL_COMMUNITY): Payer: Medicare Other

## 2016-09-02 DIAGNOSIS — E1165 Type 2 diabetes mellitus with hyperglycemia: Secondary | ICD-10-CM | POA: Diagnosis not present

## 2016-09-02 DIAGNOSIS — K439 Ventral hernia without obstruction or gangrene: Secondary | ICD-10-CM | POA: Insufficient documentation

## 2016-09-02 DIAGNOSIS — J45909 Unspecified asthma, uncomplicated: Secondary | ICD-10-CM | POA: Insufficient documentation

## 2016-09-02 DIAGNOSIS — N739 Female pelvic inflammatory disease, unspecified: Secondary | ICD-10-CM | POA: Insufficient documentation

## 2016-09-02 DIAGNOSIS — Z7984 Long term (current) use of oral hypoglycemic drugs: Secondary | ICD-10-CM | POA: Insufficient documentation

## 2016-09-02 DIAGNOSIS — R1084 Generalized abdominal pain: Secondary | ICD-10-CM

## 2016-09-02 DIAGNOSIS — Z91040 Latex allergy status: Secondary | ICD-10-CM | POA: Insufficient documentation

## 2016-09-02 DIAGNOSIS — R739 Hyperglycemia, unspecified: Secondary | ICD-10-CM

## 2016-09-02 DIAGNOSIS — N898 Other specified noninflammatory disorders of vagina: Secondary | ICD-10-CM | POA: Diagnosis present

## 2016-09-02 LAB — URINALYSIS, ROUTINE W REFLEX MICROSCOPIC
BACTERIA UA: NONE SEEN
BILIRUBIN URINE: NEGATIVE
Glucose, UA: >=500 mg/dL — AB
Hgb urine dipstick: NEGATIVE
Ketones, ur: 5 mg/dL — AB
Leukocytes, UA: NEGATIVE
NITRITE: NEGATIVE
PH: 5 (ref 5.0–8.0)
Protein, ur: NEGATIVE mg/dL
SPECIFIC GRAVITY, URINE: 1.035 — AB (ref 1.005–1.030)
SQUAMOUS EPITHELIAL / LPF: NONE SEEN
WBC, UA: NONE SEEN WBC/hpf (ref 0–5)

## 2016-09-02 LAB — CBC
HCT: 38.5 % (ref 36.0–46.0)
HEMOGLOBIN: 13.9 g/dL (ref 12.0–15.0)
MCH: 29.1 pg (ref 26.0–34.0)
MCHC: 36.1 g/dL — AB (ref 30.0–36.0)
MCV: 80.7 fL (ref 78.0–100.0)
Platelets: 330 10*3/uL (ref 150–400)
RBC: 4.77 MIL/uL (ref 3.87–5.11)
RDW: 13.4 % (ref 11.5–15.5)
WBC: 7.6 10*3/uL (ref 4.0–10.5)

## 2016-09-02 LAB — WET PREP, GENITAL
Sperm: NONE SEEN
TRICH WET PREP: NONE SEEN
YEAST WET PREP: NONE SEEN

## 2016-09-02 LAB — COMPREHENSIVE METABOLIC PANEL
ALBUMIN: 4 g/dL (ref 3.5–5.0)
ALT: 17 U/L (ref 14–54)
AST: 17 U/L (ref 15–41)
Alkaline Phosphatase: 102 U/L (ref 38–126)
Anion gap: 10 (ref 5–15)
BILIRUBIN TOTAL: 0.4 mg/dL (ref 0.3–1.2)
BUN: 11 mg/dL (ref 6–20)
CO2: 25 mmol/L (ref 22–32)
Calcium: 9.7 mg/dL (ref 8.9–10.3)
Chloride: 97 mmol/L — ABNORMAL LOW (ref 101–111)
Creatinine, Ser: 0.67 mg/dL (ref 0.44–1.00)
GFR calc Af Amer: >60 mL/min (ref >60)
GFR calc non Af Amer: >60 mL/min (ref >60)
GLUCOSE: 392 mg/dL — AB (ref 65–99)
POTASSIUM: 3.7 mmol/L (ref 3.5–5.1)
SODIUM: 132 mmol/L — AB (ref 135–145)
TOTAL PROTEIN: 8.2 g/dL — AB (ref 6.5–8.1)

## 2016-09-02 LAB — LIPASE, BLOOD: Lipase: 16 U/L (ref 11–51)

## 2016-09-02 LAB — I-STAT BETA HCG BLOOD, ED (MC, WL, AP ONLY)

## 2016-09-02 MED ORDER — IOPAMIDOL (ISOVUE-300) INJECTION 61%
INTRAVENOUS | Status: AC
  Filled 2016-09-02: qty 100

## 2016-09-02 MED ORDER — IOPAMIDOL (ISOVUE-300) INJECTION 61%
100.0000 mL | Freq: Once | INTRAVENOUS | Status: AC | PRN
  Administered 2016-09-02: 100 mL via INTRAVENOUS

## 2016-09-02 MED ORDER — AZITHROMYCIN 1 G PO PACK
1.0000 g | PACK | Freq: Once | ORAL | Status: AC
  Administered 2016-09-02: 1 g via ORAL
  Filled 2016-09-02: qty 1

## 2016-09-02 MED ORDER — DEXTROSE 5 % IV SOLN
1.0000 g | Freq: Once | INTRAVENOUS | Status: AC
  Administered 2016-09-02: 1 g via INTRAVENOUS
  Filled 2016-09-02: qty 10

## 2016-09-02 NOTE — ED Notes (Signed)
Attempted to draw patient's blood when patient began to complain that the triage nurse had told her that she was getting a room. Patient stated that she should not have come to the hospital. She was mad so I asked do you want me to get your blood. She stated that I had an attitude and I let her know I did not but if she wanted to leave and did not want her blood drawn I would not do it. Again she said that I had an attitude and she wanted someone else to draw her blood. So I let her know I would place her in the lobby and was pushing the wheelchair out of the consultation room when she put her hands on the door in way that  would not allow me to push her and the wheelchair out of the room or to let me out.  I felt trapped and  at this point I moved the patient's hands off of the door to get her and myself out of the room. The patient then jumped out of the wheelchair and wanted the police called. So I called on the radio for the off duty officer.

## 2016-09-02 NOTE — ED Provider Notes (Signed)
[facility]-EMERGENCY DEPT Provider Note   CSN: [id number] Arrival date & time: 09/02/16  1759     History   Chief Complaint Chief Complaint  Patient presents with  . Abdominal Pain  . Vaginal Pain    HPI Evelyn Watts is a 41 y.o. female.  HPI  41 year old morbidly obese female presents today complaining of bilateral flank pain radiating to umbilicus. She states this is consistent with previous hernia pain. Has been present for a week or month. She states it is worse today. She states she also has abnormal vaginal discharge and feels some of the pain radiates down to her pelvis. She denies any fever, chills, or vomiting. She has had some nausea. She denies any previous STDs and states that she has not been sexually active for 5 months. Her last menstrual period was 2 weeks ago and was normal menstrual period. She states she has had bilateral tubal ligation. Pain is not worsened by food intake or movement. She was terminated E for pain.  Past Medical History:  Diagnosis Date  . Asthma   . Diabetes mellitus without complication (HCC)     There are no active problems to display for this patient.   Past Surgical History:  Procedure Laterality Date  . CESAREAN SECTION     4  . CHOLECYSTECTOMY    . HERNIA REPAIR    . TONSILLECTOMY      OB History    No data available       Home Medications    Prior to Admission medications   Medication Sig Start Date End Date Taking? Authorizing Provider  albuterol (PROVENTIL HFA;VENTOLIN HFA) 108 (90 Base) MCG/ACT inhaler Inhale 1-2 puffs into the lungs every 6 (six) hours as needed for wheezing or shortness of breath.    Yes [provider]  atorvastatin (LIPITOR) 20 MG tablet Take 20 mg by mouth at bedtime.    Yes [provider]  lisinopril (PRINIVIL,ZESTRIL) 10 MG tablet Take 10 mg by mouth daily.   Yes [provider]  metFORMIN (GLUCOPHAGE) 500 MG tablet Take 500 mg by mouth 2 (two) times daily.   Yes  [provider]  doxycycline (VIBRAMYCIN) 100 MG capsule Take 1 capsule (100 mg total) by mouth 2 (two) times daily. Patient not taking: Reported on 09/02/2016 08/27/16 09/10/16  Shaune Pollack, MD  metroNIDAZOLE (FLAGYL) 500 MG tablet Take 1 tablet (500 mg total) by mouth 2 (two) times daily. Patient not taking: Reported on 09/02/2016 08/27/16 09/10/16  Shaune Pollack, MD  nystatin (MYCOSTATIN/NYSTOP) powder Apply topically 3 (three) times daily. Patient not taking: Reported on 09/02/2016 08/27/16   Shaune Pollack, MD    Family History History reviewed. No pertinent family history.  Social History Social History  Substance Use Topics  . Smoking status: Never Smoker  . Smokeless tobacco: Never Used  . Alcohol use No     Allergies   Latex; Morphine and related; Percocet [oxycodone-acetaminophen]; and Tape   Review of Systems Review of Systems  Constitutional: Negative for activity change, appetite change, diaphoresis, fatigue and fever.  HENT: Negative.   Eyes: Negative.   Respiratory: Negative.   Cardiovascular: Negative.   Gastrointestinal: Positive for abdominal pain.  Endocrine: Negative.   Genitourinary: Positive for frequency.  Musculoskeletal: Negative.   Skin: Negative.   Allergic/Immunologic: Negative.   Hematological: Negative.   Psychiatric/Behavioral: Negative.   All other systems reviewed and are negative.    Physical Exam Updated Vital Signs BP (!) 132/91 (BP Location: Left Arm)  Pulse 87   Temp 98.2 F (36.8 C)   Resp 20   SpO2 96%   Physical Exam  Constitutional: She is oriented to person, place, and time. She appears well-developed and well-nourished. No distress.  Morbidly obese female does not appear to be in acute distress  HENT:  Head: Normocephalic and atraumatic.  Right Ear: External ear normal.  Left Ear: External ear normal.  Nose: Nose normal.  Eyes: Conjunctivae and EOM are normal. Pupils are equal, round, and reactive to light.   Neck: Normal range of motion. Neck supple.  Cardiovascular: Normal rate and regular rhythm.   Pulmonary/Chest: Effort normal.  Abdominal: Soft.  Mild diffuse tenderness palpation  Genitourinary: Vaginal discharge found.  Genitourinary Comments: Patient plans of pain with pelvic exam. She has tenderness throughout bimanual exam. Not appear to be worse with cervical motion  Musculoskeletal: Normal range of motion.  Neurological: She is alert and oriented to person, place, and time. She exhibits normal muscle tone. Coordination normal.  Skin: Skin is warm and dry.  Psychiatric: She has a normal mood and affect. Her behavior is normal. Thought content normal.  Nursing note and vitals reviewed.    ED Treatments / Results  Labs (all labs ordered are listed, but only abnormal results are displayed) Labs Reviewed  COMPREHENSIVE METABOLIC PANEL - Abnormal; Notable for the following:       Result Value   Sodium 132 (*)    Chloride 97 (*)    Glucose, Bld 392 (*)    Total Protein 8.2 (*)    All other components within normal limits  CBC - Abnormal; Notable for the following:    MCHC 36.1 (*)    All other components within normal limits  URINALYSIS, ROUTINE W REFLEX MICROSCOPIC - Abnormal; Notable for the following:    Color, Urine STRAW (*)    Specific Gravity, Urine 1.035 (*)    Glucose, UA >=500 (*)    Ketones, ur 5 (*)    All other components within normal limits  WET PREP, GENITAL  LIPASE, BLOOD  I-STAT BETA HCG BLOOD, ED ([facility], [facility], [facility] ONLY)  GC/CHLAMYDIA PROBE AMP [facility]    EKG  EKG Interpretation None       Radiology No results found.  Procedures Procedures (including critical care time)  Medications Ordered in ED Medications  cefTRIAXone (ROCEPHIN) 1 g in dextrose 5 % 50 mL IVPB (not administered)  azithromycin (ZITHROMAX) powder 1 g (1 g Oral Given 09/02/16 2248)     Initial Impression / Assessment and Plan / ED Course  I have reviewed the  triage vital signs and the nursing notes.  Pertinent labs & imaging results that were available during my care of the patient were reviewed by me and considered in my medical decision making (see chart for details).     1-abdominal pain 2 vaginal pain with discharge patient treated here with Rocephin and Zithromax 3-hyperglycemia without DKA in diabetic Final Clinical Impressions(s) / ED Diagnoses   Final diagnoses:  Generalized abdominal pain  Ventral hernia without obstruction or gangrene  Pelvic infection in female  Hyperglycemia    New Prescriptions New Prescriptions   No medications on file     Margarita Grizzleay, Schuyler Olden, MD 09/03/16 [phone]

## 2016-09-02 NOTE — ED Triage Notes (Signed)
Pt wants to evaluated for bilateral lower quadrants abdominal pain and vaginal pain with recent treatment for candida and trich. She also states she "might be pregnant.

## 2016-09-03 NOTE — Discharge Instructions (Signed)
You're abdominal ct shows no change in your hernia. You were treated today for a pelvic infection Please followup with your doctor next week.  Your blood sugar is elevated. Please take all of your diabetes medications as prescribed. Avoid sugar or simple carbohydrates.

## 2016-09-05 NOTE — ED Notes (Signed)
Error occurred with swab sample sent out for GC/Chlamydia.  Notified Dr. Dalene SeltzerSchlossman for consult.  Pt treated in facility.  Notify patient to follow up with primary if needed.

## 2016-09-07 ENCOUNTER — Encounter (HOSPITAL_COMMUNITY): Payer: Self-pay | Admitting: *Deleted

## 2016-09-07 ENCOUNTER — Ambulatory Visit (HOSPITAL_COMMUNITY)
Admission: EM | Admit: 2016-09-07 | Discharge: 2016-09-07 | Disposition: A | Discharge status: Home / Self Care | Payer: Medicare Other | Attending: Family Medicine | Admitting: Family Medicine

## 2016-09-07 DIAGNOSIS — B3731 Acute candidiasis of vulva and vagina: Secondary | ICD-10-CM

## 2016-09-07 DIAGNOSIS — B373 Candidiasis of vulva and vagina: Secondary | ICD-10-CM

## 2016-09-07 MED ORDER — FLUCONAZOLE 150 MG PO TABS: 150.0000 mg | ORAL_TABLET | Freq: Every day | ORAL | 0 refills | Status: DC

## 2016-09-07 MED ORDER — NYSTATIN 100000 UNIT/GM EX CREA: 1.0000 "application " | TOPICAL_CREAM | Freq: Two times a day (BID) | CUTANEOUS | 1 refills | Status: DC

## 2016-09-07 MED ORDER — NYSTATIN 100000 UNIT/GM EX POWD: Freq: Three times a day (TID) | CUTANEOUS | 1 refills | Status: DC

## 2016-09-07 NOTE — ED Provider Notes (Signed)
CSN: [id number]     Arrival date & time 09/07/16  1808 History   None    Chief Complaint  Patient presents with  . Groin Swelling   (Consider location/radiation/quality/duration/timing/severity/associated sxs/prior Treatment) Patient c/o vaginal discomfort.  She has recent workup which was negative.  She reports burning on her genital area.   The history is provided by the patient.  Vaginal Itching  This is a new problem. The problem occurs constantly. The problem has not changed since onset.Nothing aggravates the symptoms. Nothing relieves the symptoms. She has tried nothing for the symptoms.    Past Medical History:  Diagnosis Date  . Asthma   . Diabetes mellitus without complication [facility])    Past Surgical History:  Procedure Laterality Date  . CESAREAN SECTION     4  . CHOLECYSTECTOMY    . HERNIA REPAIR    . TONSILLECTOMY     History reviewed. No pertinent family history. Social History  Substance Use Topics  . Smoking status: Never Smoker  . Smokeless tobacco: Never Used  . Alcohol use No   OB History    No data available     Review of Systems  Constitutional: Negative.   HENT: Negative.   Eyes: Negative.   Respiratory: Negative.   Cardiovascular: Negative.   Gastrointestinal: Negative.   Endocrine: Negative.   Genitourinary: Negative.   Musculoskeletal: Negative.   Skin: Positive for rash.  Allergic/Immunologic: Negative.   Neurological: Negative.   Hematological: Negative.   Psychiatric/Behavioral: Negative.     Allergies  Latex; Morphine and related; Percocet [oxycodone-acetaminophen]; and Tape  Home Medications   Prior to Admission medications   Medication Sig Start Date End Date Taking? Authorizing Provider  albuterol (PROVENTIL HFA;VENTOLIN HFA) 108 (90 Base) MCG/ACT inhaler Inhale 1-2 puffs into the lungs every 6 (six) hours as needed for wheezing or shortness of breath.     [provider]  atorvastatin (LIPITOR) 20 MG tablet Take  20 mg by mouth at bedtime.     [provider]  doxycycline (VIBRAMYCIN) 100 MG capsule Take 1 capsule (100 mg total) by mouth 2 (two) times daily. Patient not taking: Reported on 09/02/2016 08/27/16 09/10/16  Shaune Pollack, MD  fluconazole (DIFLUCAN) 150 MG tablet Take 1 tablet (150 mg total) by mouth daily. 09/07/16   Deatra Canter, FNP  lisinopril (PRINIVIL,ZESTRIL) 10 MG tablet Take 10 mg by mouth daily.    [provider]  metFORMIN (GLUCOPHAGE) 500 MG tablet Take 500 mg by mouth 2 (two) times daily.    [provider]  metroNIDAZOLE (FLAGYL) 500 MG tablet Take 1 tablet (500 mg total) by mouth 2 (two) times daily. Patient not taking: Reported on 09/02/2016 08/27/16 09/10/16  Shaune Pollack, MD  nystatin (MYCOSTATIN/NYSTOP) powder Apply topically 3 (three) times daily. 09/07/16   Deatra Canter, FNP  nystatin cream (MYCOSTATIN) Apply 1 application topically 2 (two) times daily. 09/07/16   Deatra Canter, FNP   Meds Ordered and Administered this Visit  Medications - No data to display  BP 140/72 (BP Location: Right Arm)   Pulse 82   Temp 98.6 F (37 C) (Oral)   Resp 18   LMP 08/19/2016   SpO2 100%  No data found.   Physical Exam  Constitutional: She appears well-developed and well-nourished.  HENT:  Head: Normocephalic and atraumatic.  Eyes: Conjunctivae and EOM are normal. Pupils are equal, round, and reactive to light.  Neck: Normal range of motion. Neck supple.  Cardiovascular: Normal rate,  regular rhythm and normal heart sounds.   Pulmonary/Chest: Effort normal and breath sounds normal.  Genitourinary:  Genitourinary Comments: Vagina and Vulva with rash and erythema.  Perineum with erythema.  Nursing note and vitals reviewed.   Urgent Care Course     Procedures (including critical care time)  Labs Review Labs Reviewed - No data to display  Imaging Review No results found.   Visual Acuity Review  Right Eye Distance:   Left Eye  Distance:   Bilateral Distance:    Right Eye Near:   Left Eye Near:    Bilateral Near:         MDM   1. Candidiasis of vulva and vagina    Difulcan 150mg  one po qd x 5 days Nystatin Cream apply bid #30grams Nystatin Powder apply qd      Deatra CanterOxford, Aminat Shelburne J, FNP 09/07/16 1932

## 2016-09-07 NOTE — ED Triage Notes (Signed)
Pt  Reports   Vaginal    Pain       And   Swelling    X   4  Months   [facility]   [facility]  Had   Examination        Had  Tests     Done   Which  She  Was  Told  Were  Normal

## 2016-09-18 ENCOUNTER — Encounter (HOSPITAL_COMMUNITY): Payer: Self-pay | Admitting: Emergency Medicine

## 2016-09-18 ENCOUNTER — Emergency Department (HOSPITAL_COMMUNITY): Payer: Medicare Other

## 2016-09-18 ENCOUNTER — Emergency Department (HOSPITAL_COMMUNITY)
Admission: EM | Admit: 2016-09-18 | Discharge: 2016-09-18 | Disposition: A | Discharge status: Home / Self Care | Payer: Medicare Other | Attending: Emergency Medicine | Admitting: Emergency Medicine

## 2016-09-18 DIAGNOSIS — R062 Wheezing: Secondary | ICD-10-CM | POA: Diagnosis present

## 2016-09-18 DIAGNOSIS — Z91040 Latex allergy status: Secondary | ICD-10-CM | POA: Insufficient documentation

## 2016-09-18 DIAGNOSIS — E119 Type 2 diabetes mellitus without complications: Secondary | ICD-10-CM | POA: Diagnosis not present

## 2016-09-18 DIAGNOSIS — J4521 Mild intermittent asthma with (acute) exacerbation: Secondary | ICD-10-CM | POA: Insufficient documentation

## 2016-09-18 DIAGNOSIS — Z794 Long term (current) use of insulin: Secondary | ICD-10-CM | POA: Diagnosis not present

## 2016-09-18 DIAGNOSIS — Z79899 Other long term (current) drug therapy: Secondary | ICD-10-CM | POA: Insufficient documentation

## 2016-09-18 LAB — CBG MONITORING, ED: GLUCOSE-CAPILLARY: 323 mg/dL — AB (ref 65–99)

## 2016-09-18 MED ORDER — IPRATROPIUM-ALBUTEROL 0.5-2.5 (3) MG/3ML IN SOLN
3.0000 mL | Freq: Once | RESPIRATORY_TRACT | Status: AC
  Administered 2016-09-18: 3 mL via RESPIRATORY_TRACT
  Filled 2016-09-18: qty 3

## 2016-09-18 MED ORDER — ALBUTEROL SULFATE HFA 108 (90 BASE) MCG/ACT IN AERS: 1.0000 | INHALATION_SPRAY | Freq: Four times a day (QID) | RESPIRATORY_TRACT | 0 refills | Status: DC | PRN

## 2016-09-18 MED ORDER — PREDNISONE 20 MG PO TABS
60.0000 mg | ORAL_TABLET | Freq: Once | ORAL | Status: AC
  Administered 2016-09-18: 60 mg via ORAL
  Filled 2016-09-18: qty 3

## 2016-09-18 NOTE — Discharge Instructions (Signed)
It was my pleasure taking care of you today!   Use your inhaler as needed for cough, wheezing, shortness of breath.  Take your home medications as directed. Please monitor your blood sugars closely.  Return to ER for difficulty breathing, new or worsening symptoms, any additional concerns.  It is very important that you follow up with your primary care provider. If you do not have one, please read information below.    To find a primary care or specialty doctor please call [phone] or [phone] to access "[place] Find a Doctor Service."  You may also go on the [facility] website at [email or web address]  There are also multiple [facility] practices throughout the [place] that are frequently accepting new patients. You may find a clinic that is close to your home and contact them.  [facility] - [address] [place] [phone]  [facility] in [place] (also locations in [place] and [place]) - [address] [place] [phone]  [facility] - [address] [place] [phone]

## 2016-09-18 NOTE — ED Notes (Signed)
Patient transported to X-ray 

## 2016-09-18 NOTE — ED Notes (Signed)
Pt ambulates to br with steady gait and no resp distress or complaint

## 2016-09-18 NOTE — ED Triage Notes (Signed)
Pt to ED via [facility] from home-- [place] [address]- pt lives in rooming house near where she was picked up. [t initial complaint to EMS was cough-- was nonverbal- "throat hurts" then started pointing to chest w c/o chest pain. Pt has hx of asthma- has tight nonproductive cough, states she is out of her inhalers.  Did receive ASA 324 enroute.

## 2016-09-18 NOTE — ED Notes (Signed)
Pt states she understands instructions. Home stable with steady gait. 

## 2016-09-18 NOTE — ED Provider Notes (Signed)
[facility]-EMERGENCY DEPT Provider Note   CSN: [id number] Arrival date & time: 09/18/16  1022     History   Chief Complaint Chief Complaint  Patient presents with  . Cough  . Chest Pain    HPI Evelyn Watts is a 41 y.o. female.  The history is provided by the patient and medical records. No language interpreter was used.  Cough  Associated symptoms include shortness of breath and wheezing. Pertinent negatives include no chest pain.  Chest Pain   Associated symptoms include cough and shortness of breath.   Evelyn Watts is a 42 y.o. female  with a PMH of asthma and DM who presents to the Emergency Department complaining of shortness of breath and wheezing which began last night. Associated with dry cough. She states that her seasonal allergies have been acting up for the last month. She has been struggling with nasal congestion and her eyes for the last month, however last night, her chest began to feel tight and she could hear herself wheezing. She tried to find her inhaler, but did not know where it was. No medications or treatment at home for symptoms. 324 ASA given by EMS in route. Patient states this feels similar to prior asthma exacerbations. Blood sugars have been doing well, 150 or less. No fever or chills. No back pain or abdominal pain. No sore throat, hoarseness.   Past Medical History:  Diagnosis Date  . Asthma   . Diabetes mellitus without complication (HCC)     There are no active problems to display for this patient.   Past Surgical History:  Procedure Laterality Date  . CESAREAN SECTION     4  . CHOLECYSTECTOMY    . HERNIA REPAIR    . TONSILLECTOMY      OB History    No data available       Home Medications    Prior to Admission medications   Medication Sig Start Date End Date Taking? Authorizing Provider  atorvastatin (LIPITOR) 20 MG tablet Take 20 mg by mouth at bedtime.    Yes [provider]  insulin glargine (LANTUS) 100 UNIT/ML  injection Inject 45 Units into the skin daily as needed (for high blood sugar).   Yes [provider]  lisinopril (PRINIVIL,ZESTRIL) 10 MG tablet Take 10 mg by mouth daily.   Yes [provider]  metFORMIN (GLUCOPHAGE) 500 MG tablet Take 500 mg by mouth 2 (two) times daily.   Yes [provider]  albuterol (PROVENTIL HFA;VENTOLIN HFA) 108 (90 Base) MCG/ACT inhaler Inhale 1-2 puffs into the lungs every 6 (six) hours as needed for wheezing or shortness of breath. 09/18/16   Ward, Chase Picket, PA-C  fluconazole (DIFLUCAN) 150 MG tablet Take 1 tablet (150 mg total) by mouth daily. Patient not taking: Reported on 09/18/2016 09/07/16   Deatra Canter, FNP  nystatin (MYCOSTATIN/NYSTOP) powder Apply topically 3 (three) times daily. Patient not taking: Reported on 09/18/2016 09/07/16   Deatra Canter, FNP  nystatin cream (MYCOSTATIN) Apply 1 application topically 2 (two) times daily. Patient not taking: Reported on 09/18/2016 09/07/16   Deatra Canter, FNP    Family History No family history on file.  Social History Social History  Substance Use Topics  . Smoking status: Never Smoker  . Smokeless tobacco: Never Used  . Alcohol use No     Allergies   Latex; Morphine and related; Percocet [oxycodone-acetaminophen]; and Tape   Review of Systems Review of Systems  Respiratory: Positive for cough,  chest tightness, shortness of breath and wheezing.   Cardiovascular: Negative for chest pain.  All other systems reviewed and are negative.    Physical Exam Updated Vital Signs BP (!) 100/53 (BP Location: Right Arm)   Pulse 92   Temp 97.9 F (36.6 C) (Oral)   Resp 18   Ht 5\' 11"  (1.803 m)   Wt 127 kg (280 lb)   LMP 09/13/2016   SpO2 97%   BMI 39.05 kg/m   Physical Exam  Constitutional: She is oriented to person, place, and time. She appears well-developed and well-nourished. No distress.  HENT:  Head: Normocephalic and atraumatic.  Mouth/Throat:  Oropharynx is clear and moist.  Cardiovascular: Normal rate, regular rhythm and normal heart sounds.   No murmur heard. Pulmonary/Chest: Effort normal. No respiratory distress.  Expiratory wheezing bilaterally. Speaking in full sentences without difficulty or wheezing.  Abdominal: Soft. She exhibits no distension. There is no tenderness.  Musculoskeletal: She exhibits no edema.  Neurological: She is alert and oriented to person, place, and time.  Skin: Skin is warm and dry.  Nursing note and vitals reviewed.    ED Treatments / Results  Labs (all labs ordered are listed, but only abnormal results are displayed) Labs Reviewed  CBG MONITORING, ED - Abnormal; Notable for the following:       Result Value   Glucose-Capillary 323 (*)    All other components within normal limits    EKG  EKG Interpretation None       Radiology Dg Chest 2 View  Result Date: 09/18/2016 CLINICAL DATA:  Nonproductive cough, wheezing EXAM: CHEST  2 VIEW COMPARISON:  04/07/2016 FINDINGS: The heart size and mediastinal contours are within normal limits. Both lungs are clear. The visualized skeletal structures are unremarkable. IMPRESSION: No active cardiopulmonary disease. Electronically Signed   By: Elige KoHetal  Patel   On: 09/18/2016 12:58    Procedures Procedures (including critical care time)  Medications Ordered in ED Medications  predniSONE (DELTASONE) tablet 60 mg (60 mg Oral Given 09/18/16 1126)  ipratropium-albuterol (DUONEB) 0.5-2.5 (3) MG/3ML nebulizer solution 3 mL (3 mLs Nebulization Given 09/18/16 1127)     Initial Impression / Assessment and Plan / ED Course  I have reviewed the triage vital signs and the nursing notes.  Pertinent labs & imaging results that were available during my care of the patient were reviewed by me and considered in my medical decision making (see chart for details).    Evelyn Schlatteratricia Terrance is a 41 y.o. female who presents to ED for cough and wheezing which began  yesterday. On exam, patient is afebrile, hemodynamically stable with expiratory wheezing bilaterally. Duobneb and 60mg  prednisone given. CXR negative. On re-evaluation, patient feels much improved. Faint wheezing, but lungs sound much better. She feels comfortable with discharge to home. Hx of DM with CBG of 323 today. She appears to have very little insight on her medical condition and diabetes medication regimen. She does not know the names of her DM meds and states that one of them she takes "45 units when I need it". She is on Metformin, but when asked about this, she states the prescription is at a different pharmacy and she has forgotten to call and get it switched to the pharmacy here in town. She also asked me if she could have something to eat, stating that her blood sugar is likely high today because she has not eaten all day and it may go down if I could get her something to eat.  Given poor insight into DM and blood sugar already elevated, I believe the risks of prednisone burst outweigh the benefits. She was out of her home rescue inhaler which I have refilled. We discussed at length how to use rescue inhaler and reasons to return to ER. Stressed the importance of PCP follow up. All questions answered and patient discharged to home in satisfactory condition.   Patient discussed with Dr. Juleen China who agrees with treatment plan.    Final Clinical Impressions(s) / ED Diagnoses   Final diagnoses:  Wheezing  Exacerbation of intermittent asthma, unspecified asthma severity    New Prescriptions New Prescriptions   ALBUTEROL (PROVENTIL HFA;VENTOLIN HFA) 108 (90 BASE) MCG/ACT INHALER    Inhale 1-2 puffs into the lungs every 6 (six) hours as needed for wheezing or shortness of breath.     Ward, Chase Picket, PA-C 09/18/16 1433    Raeford Razor, MD 09/19/16 [phone]

## 2016-09-18 NOTE — ED Notes (Signed)
Pt resp treatment finished-- resp easy-- voice clear, requesting referral to regular doctor-- states only has a doctor in "group" for alcohol and drug treatment.

## 2016-10-04 ENCOUNTER — Emergency Department (HOSPITAL_COMMUNITY)
Admission: EM | Admit: 2016-10-04 | Discharge: 2016-10-04 | Disposition: A | Discharge status: Home / Self Care | Payer: Medicare Other | Attending: Emergency Medicine | Admitting: Emergency Medicine

## 2016-10-04 ENCOUNTER — Encounter (HOSPITAL_COMMUNITY): Payer: Self-pay | Admitting: Emergency Medicine

## 2016-10-04 ENCOUNTER — Emergency Department (HOSPITAL_COMMUNITY): Payer: Medicare Other

## 2016-10-04 DIAGNOSIS — R1031 Right lower quadrant pain: Secondary | ICD-10-CM | POA: Insufficient documentation

## 2016-10-04 DIAGNOSIS — R1084 Generalized abdominal pain: Secondary | ICD-10-CM | POA: Diagnosis present

## 2016-10-04 DIAGNOSIS — Z79899 Other long term (current) drug therapy: Secondary | ICD-10-CM | POA: Diagnosis not present

## 2016-10-04 DIAGNOSIS — Z91040 Latex allergy status: Secondary | ICD-10-CM | POA: Insufficient documentation

## 2016-10-04 DIAGNOSIS — R739 Hyperglycemia, unspecified: Secondary | ICD-10-CM | POA: Diagnosis not present

## 2016-10-04 DIAGNOSIS — J45909 Unspecified asthma, uncomplicated: Secondary | ICD-10-CM | POA: Diagnosis not present

## 2016-10-04 DIAGNOSIS — E119 Type 2 diabetes mellitus without complications: Secondary | ICD-10-CM | POA: Insufficient documentation

## 2016-10-04 DIAGNOSIS — Z7984 Long term (current) use of oral hypoglycemic drugs: Secondary | ICD-10-CM | POA: Diagnosis not present

## 2016-10-04 LAB — COMPREHENSIVE METABOLIC PANEL
ALK PHOS: 81 U/L (ref 38–126)
ALT: 14 U/L (ref 14–54)
ANION GAP: 9 (ref 5–15)
AST: 18 U/L (ref 15–41)
Albumin: 3.4 g/dL — ABNORMAL LOW (ref 3.5–5.0)
BUN: 13 mg/dL (ref 6–20)
CALCIUM: 8.7 mg/dL — AB (ref 8.9–10.3)
CO2: 22 mmol/L (ref 22–32)
Chloride: 100 mmol/L — ABNORMAL LOW (ref 101–111)
Creatinine, Ser: 0.6 mg/dL (ref 0.44–1.00)
GFR calc non Af Amer: >60 mL/min (ref >60)
Glucose, Bld: 401 mg/dL — ABNORMAL HIGH (ref 65–99)
Potassium: 3.9 mmol/L (ref 3.5–5.1)
SODIUM: 131 mmol/L — AB (ref 135–145)
Total Bilirubin: 0.4 mg/dL (ref 0.3–1.2)
Total Protein: 6.8 g/dL (ref 6.5–8.1)

## 2016-10-04 LAB — BLOOD GAS, VENOUS
ACID-BASE DEFICIT: 2.6 mmol/L — AB (ref 0.0–2.0)
BICARBONATE: 22.1 mmol/L (ref 20.0–28.0)
FIO2: 21
O2 Saturation: 82.4 %
PCO2 VEN: 40.1 mmHg — AB (ref 44.0–60.0)
PH VEN: 7.36 (ref 7.250–7.430)
Patient temperature: 98.6
pO2, Ven: 50.3 mmHg — ABNORMAL HIGH (ref 32.0–45.0)

## 2016-10-04 LAB — CBC
HCT: 35.1 % — ABNORMAL LOW (ref 36.0–46.0)
HEMOGLOBIN: 12.7 g/dL (ref 12.0–15.0)
MCH: 29.2 pg (ref 26.0–34.0)
MCHC: 36.2 g/dL — ABNORMAL HIGH (ref 30.0–36.0)
MCV: 80.7 fL (ref 78.0–100.0)
Platelets: 274 10*3/uL (ref 150–400)
RBC: 4.35 MIL/uL (ref 3.87–5.11)
RDW: 13.4 % (ref 11.5–15.5)
WBC: 7.1 10*3/uL (ref 4.0–10.5)

## 2016-10-04 LAB — URINALYSIS, ROUTINE W REFLEX MICROSCOPIC
BILIRUBIN URINE: NEGATIVE
Bacteria, UA: NONE SEEN
Glucose, UA: >=500 mg/dL — AB
HGB URINE DIPSTICK: NEGATIVE
KETONES UR: 5 mg/dL — AB
LEUKOCYTES UA: NEGATIVE
NITRITE: NEGATIVE
PROTEIN: NEGATIVE mg/dL
RBC / HPF: NONE SEEN RBC/hpf (ref 0–5)
Specific Gravity, Urine: 1.031 — ABNORMAL HIGH (ref 1.005–1.030)
pH: 6 (ref 5.0–8.0)

## 2016-10-04 LAB — CBG MONITORING, ED
Glucose-Capillary: 395 mg/dL — ABNORMAL HIGH (ref 65–99)
Glucose-Capillary: 335 mg/dL — ABNORMAL HIGH (ref 65–99)

## 2016-10-04 LAB — LIPASE, BLOOD: LIPASE: 20 U/L (ref 11–51)

## 2016-10-04 LAB — POC URINE PREG, ED: PREG TEST UR: NEGATIVE

## 2016-10-04 MED ORDER — SODIUM CHLORIDE 0.9 % IV BOLUS (SEPSIS)
1000.0000 mL | Freq: Once | INTRAVENOUS | Status: AC
  Administered 2016-10-04: 1000 mL via INTRAVENOUS

## 2016-10-04 MED ORDER — ONDANSETRON HCL 4 MG/2ML IJ SOLN
4.0000 mg | Freq: Once | INTRAMUSCULAR | Status: AC
  Administered 2016-10-04: 4 mg via INTRAVENOUS
  Filled 2016-10-04: qty 2

## 2016-10-04 MED ORDER — FENTANYL CITRATE (PF) 100 MCG/2ML IJ SOLN
50.0000 ug | Freq: Once | INTRAMUSCULAR | Status: AC
  Administered 2016-10-04: 50 ug via INTRAVENOUS
  Filled 2016-10-04: qty 2

## 2016-10-04 MED ORDER — SODIUM CHLORIDE 0.9 % IV BOLUS (SEPSIS)
1000.0000 mL | Freq: Once | INTRAVENOUS | Status: AC
Start: 2016-10-04 — End: 2016-10-04
  Administered 2016-10-04: 1000 mL via INTRAVENOUS

## 2016-10-04 MED ORDER — IOPAMIDOL (ISOVUE-300) INJECTION 61%
INTRAVENOUS | Status: AC
  Administered 2016-10-04: 100 mL via INTRAVENOUS
  Filled 2016-10-04: qty 100

## 2016-10-04 MED ORDER — IOPAMIDOL (ISOVUE-300) INJECTION 61%
100.0000 mL | Freq: Once | INTRAVENOUS | Status: AC | PRN
  Administered 2016-10-04: 100 mL via INTRAVENOUS

## 2016-10-04 MED ORDER — INSULIN ASPART 100 UNIT/ML ~~LOC~~ SOLN
10.0000 [IU] | Freq: Once | SUBCUTANEOUS | Status: AC
  Administered 2016-10-04: 10 [IU] via SUBCUTANEOUS
  Filled 2016-10-04: qty 1

## 2016-10-04 NOTE — ED Notes (Signed)
Bed: [id number] Expected date:  Expected time:  Means of arrival:  Comments: 41 yo F  abd pain

## 2016-10-04 NOTE — ED Notes (Signed)
ED Provider at bedside. 

## 2016-10-04 NOTE — ED Triage Notes (Signed)
Per EMS, pt. From home with complaint of RLQ abdominal pain at 10/10 which started at 9pm last night. Pt. cbg is 460mg /dl. At 1230 Per EMS. Pt. Stated that she's on metformin and taking it regularly but no means of checking sugar for two months now. Pt. Denied N/V. Denied diarrhea.

## 2016-10-04 NOTE — Discharge Instructions (Signed)
Make sure you get sure medications refilled. Motrin and Tylenol for pain. Make sure you follow up with her primary care doctor. Return to the ED if your symptoms worsen.

## 2016-10-04 NOTE — ED Provider Notes (Signed)
[facility]-EMERGENCY DEPT Provider Note   CSN: [id number] Arrival date & time: 10/04/16  0051     History   Chief Complaint Chief Complaint  Patient presents with  . Hyperglycemia  . Abdominal Pain    HPI Evelyn Watts is a 41 y.o. female.  HPI 41 year old African-American female past medical history significant for asthma and diabetes presents to the emergency department today with complaints of right lower quadrant abdominal pain. Patient states the pain started approximately 9 PM this evening. She describes it as sharp in nature and constant. Does not radiate. Nothing makes better or worse. She has not tried nothing for her pain prior to arrival. Notes history of cholecystectomy. Bowel movements are normal. No melena or hematochezia. No nausea or emesis. No urinary symptoms or vaginal discharge. EMS also notes the patient's blood sugar was 460 and route. Patient states that she takes metformin and has been out of her insulin for the past 2 days. Patient denies any fever, chills, headache, vision changes, lightheadedness, dizziness, chest pain, shortness of breath, urinary symptoms, vaginal symptoms, change in bowel habits including diarrhea, paresthesias. Past Medical History:  Diagnosis Date  . Asthma   . Diabetes mellitus without complication (HCC)     There are no active problems to display for this patient.   Past Surgical History:  Procedure Laterality Date  . CESAREAN SECTION     4  . CHOLECYSTECTOMY    . HERNIA REPAIR    . TONSILLECTOMY      OB History    No data available       Home Medications    Prior to Admission medications   Medication Sig Start Date End Date Taking? Authorizing Provider  albuterol (PROVENTIL HFA;VENTOLIN HFA) 108 (90 Base) MCG/ACT inhaler Inhale 1-2 puffs into the lungs every 6 (six) hours as needed for wheezing or shortness of breath. 09/18/16  Yes Ward, Chase Picket, PA-C  atorvastatin (LIPITOR) 20 MG tablet Take 20 mg by mouth at  bedtime.    Yes [provider]  insulin glargine (LANTUS) 100 UNIT/ML injection Inject 45 Units into the skin daily as needed (for high blood sugar).   Yes [provider]  lisinopril (PRINIVIL,ZESTRIL) 10 MG tablet Take 10 mg by mouth daily.   Yes [provider]  metFORMIN (GLUCOPHAGE) 500 MG tablet Take 500 mg by mouth 2 (two) times daily.   Yes [provider]  fluconazole (DIFLUCAN) 150 MG tablet Take 1 tablet (150 mg total) by mouth daily. Patient not taking: Reported on 09/18/2016 09/07/16   Deatra Canter, FNP  nystatin (MYCOSTATIN/NYSTOP) powder Apply topically 3 (three) times daily. Patient not taking: Reported on 09/18/2016 09/07/16   Deatra Canter, FNP  nystatin cream (MYCOSTATIN) Apply 1 application topically 2 (two) times daily. Patient not taking: Reported on 09/18/2016 09/07/16   Deatra Canter, FNP    Family History History reviewed. No pertinent family history.  Social History Social History  Substance Use Topics  . Smoking status: Never Smoker  . Smokeless tobacco: Never Used  . Alcohol use No     Allergies   Latex; Morphine and related; Percocet [oxycodone-acetaminophen]; and Tape   Review of Systems Review of Systems  Constitutional: Negative for chills and fever.  Eyes: Negative for visual disturbance.  Respiratory: Negative for cough and shortness of breath.   Cardiovascular: Negative for chest pain.  Gastrointestinal: Positive for abdominal pain. Negative for blood in stool, diarrhea, nausea and vomiting.  Genitourinary: Negative for dysuria, flank pain,  frequency, hematuria, urgency, vaginal bleeding and vaginal discharge.  Musculoskeletal: Negative for myalgias.  Skin: Negative.   Neurological: Negative for dizziness, syncope, weakness, light-headedness and headaches.     Physical Exam Updated Vital Signs BP 129/87 (BP Location: Left Arm)   Pulse 88   Temp 97.8 F (36.6 C) (Oral)   Resp 17   Wt 127 kg  (280 lb)   LMP 09/13/2016   SpO2 98%   BMI 39.05 kg/m   Physical Exam  Constitutional: She is oriented to person, place, and time. She appears well-developed and well-nourished. No distress.  Nontoxic appearing. Well appearing.  HENT:  Head: Normocephalic and atraumatic.  Mouth/Throat: Oropharynx is clear and moist.  Eyes: Conjunctivae are normal. Right eye exhibits no discharge. Left eye exhibits no discharge. No scleral icterus.  Neck: Normal range of motion. Neck supple. No thyromegaly present.  Cardiovascular: Normal rate, regular rhythm, normal heart sounds and intact distal pulses.  Exam reveals no gallop and no friction rub.   No murmur heard. Pulmonary/Chest: Effort normal and breath sounds normal.  Abdominal: Soft. Bowel sounds are normal. She exhibits no distension. There is tenderness in the right lower quadrant. There is no rigidity, no rebound, no guarding, no CVA tenderness, no tenderness at McBurney's point and negative Murphy's sign.    Obese abdomen.  Musculoskeletal: Normal range of motion.  Lymphadenopathy:    She has no cervical adenopathy.  Neurological: She is alert and oriented to person, place, and time.  Skin: Skin is warm and dry. Capillary refill takes less than 2 seconds.  Nursing note and vitals reviewed.    ED Treatments / Results  Labs (all labs ordered are listed, but only abnormal results are displayed) Labs Reviewed  COMPREHENSIVE METABOLIC PANEL - Abnormal; Notable for the following:       Result Value   Sodium 131 (*)    Chloride 100 (*)    Glucose, Bld 401 (*)    Calcium 8.7 (*)    Albumin 3.4 (*)    All other components within normal limits  CBC - Abnormal; Notable for the following:    HCT 35.1 (*)    MCHC 36.2 (*)    All other components within normal limits  URINALYSIS, ROUTINE W REFLEX MICROSCOPIC - Abnormal; Notable for the following:    Color, Urine STRAW (*)    Specific Gravity, Urine 1.031 (*)    Glucose, UA >=500 (*)     Ketones, ur 5 (*)    Squamous Epithelial / LPF 0-5 (*)    All other components within normal limits  BLOOD GAS, VENOUS - Abnormal; Notable for the following:    pCO2, Ven 40.1 (*)    pO2, Ven 50.3 (*)    Acid-base deficit 2.6 (*)    All other components within normal limits  CBG MONITORING, ED - Abnormal; Notable for the following:    Glucose-Capillary 395 (*)    All other components within normal limits  CBG MONITORING, ED - Abnormal; Notable for the following:    Glucose-Capillary 335 (*)    All other components within normal limits  LIPASE, BLOOD  POC URINE PREG, ED    EKG  EKG Interpretation None       Radiology Ct Abdomen Pelvis W Contrast  Result Date: 10/04/2016 CLINICAL DATA:  Acute onset of right lower quadrant abdominal pain. Initial encounter. EXAM: CT ABDOMEN AND PELVIS WITH CONTRAST TECHNIQUE: Multidetector CT imaging of the abdomen and pelvis was performed using the standard protocol following  bolus administration of intravenous contrast. CONTRAST:  100 mL of Isovue 300 IV contrast COMPARISON:  CT of the abdomen and pelvis performed 09/02/2016 FINDINGS: Lower chest: The visualized lung bases are grossly clear. The visualized portions of the mediastinum are unremarkable. Hepatobiliary: The liver is unremarkable in appearance. The patient is status post cholecystectomy, with clips noted at the gallbladder fossa. The common bile duct remains normal in caliber. Pancreas: The pancreas is within normal limits. Spleen: The spleen is unremarkable in appearance. Adrenals/Urinary Tract: The adrenal glands are unremarkable in appearance. The kidneys are within normal limits. There is no evidence of hydronephrosis. No renal or ureteral stones are identified. No perinephric stranding is seen. All Stomach/Bowel: The stomach is unremarkable in appearance. Small-bowel loops extend into a large left lower quadrant anterior abdominal wall hernia, with adjacent mesh. The small bowel is  otherwise unremarkable. There is no evidence of bowel obstruction. The appendix is normal in caliber, without evidence of appendicitis. It extends minimally into the hernia. The colon is unremarkable in appearance. Vascular/Lymphatic: The abdominal aorta is unremarkable in appearance. The inferior vena cava is grossly unremarkable. No retroperitoneal lymphadenopathy is seen. No pelvic sidewall lymphadenopathy is identified. Reproductive: The bladder is moderately distended grossly remarkable. The uterus is unremarkable in appearance. No suspicious adnexal masses are seen. The ovaries are relatively symmetric. Other: No additional soft tissue abnormalities are seen. Musculoskeletal: No acute osseous abnormalities are identified. The visualized musculature is unremarkable in appearance. IMPRESSION: 1. No acute abnormality seen within the abdomen or pelvis. 2. Small bowel loops extend into a large left lower quadrant anterior abdominal wall hernia, with adjacent mesh. No evidence of bowel obstruction. Electronically Signed   By: Roanna RaiderJeffery  Chang M.D.   On: 10/04/2016 05:40    Procedures Procedures (including critical care time)  Medications Ordered in ED Medications  insulin aspart (novoLOG) injection 10 Units (not administered)  fentaNYL (SUBLIMAZE) injection 50 mcg (not administered)  ondansetron (ZOFRAN) injection 4 mg (not administered)  sodium chloride 0.9 % bolus 1,000 mL (1,000 mLs Intravenous New Bag/Given 10/04/16 0345)     Initial Impression / Assessment and Plan / ED Course  I have reviewed the triage vital signs and the nursing notes.  Pertinent labs & imaging results that were available during my care of the patient were reviewed by me and considered in my medical decision making (see chart for details).     Patient resents with right lower quadrant abdominal pain and hyperglycemia. She's been out of her insulin for 2 days. Patient is nontoxic, nonseptic appearing, in no apparent  distress.  Patient's pain and other symptoms adequately managed in emergency department.  Fluid bolus given.  Labs, imaging and vitals reviewed. Patient's blood sugar was elevated at 450. Given 2 fluid boluses and regular insulin. Improved to 335. Patient's anion gap is normal. Bicarbonate is normal. PH is normal. Small amount of ketones in urine. Doubt DKA. She does not have intractable vomiting. Patient does not meet the SIRS or Sepsis criteria.  On repeat exam patient does not have a surgical abdomin and there are no peritoneal signs.  No indication of appendicitis, bowel obstruction, bowel perforation, cholecystitis, diverticulitis, PID or ectopic pregnancy.  Patient would not like a pelvic ultrasound exam at this time. Doubt ovarian torsion or ovarian cyst given grossly normal CT scan of the pelvis. Patient discharged home with symptomatic treatment and given strict instructions for follow-up with their primary care physician.  I have also discussed reasons to return immediately to the ER.  Patient expresses understanding and agrees with plan.     Final Clinical Impressions(s) / ED Diagnoses   Final diagnoses:  Hyperglycemia  Right lower quadrant abdominal pain    New Prescriptions Discharge Medication List as of 10/04/2016  6:17 AM       Rise Mu, PA-C 10/04/16 0808    Molpus, Jonny Ruiz, MD 10/04/16 1610

## 2016-10-10 ENCOUNTER — Encounter (HOSPITAL_COMMUNITY): Payer: Self-pay | Admitting: Emergency Medicine

## 2016-10-10 ENCOUNTER — Emergency Department (HOSPITAL_COMMUNITY)
Admission: EM | Admit: 2016-10-10 | Discharge: 2016-10-10 | Disposition: A | Discharge status: Home / Self Care | Payer: Medicare Other | Attending: Emergency Medicine | Admitting: Emergency Medicine

## 2016-10-10 DIAGNOSIS — N898 Other specified noninflammatory disorders of vagina: Secondary | ICD-10-CM | POA: Insufficient documentation

## 2016-10-10 DIAGNOSIS — Z113 Encounter for screening for infections with a predominantly sexual mode of transmission: Secondary | ICD-10-CM | POA: Diagnosis not present

## 2016-10-10 DIAGNOSIS — R739 Hyperglycemia, unspecified: Secondary | ICD-10-CM

## 2016-10-10 DIAGNOSIS — E1165 Type 2 diabetes mellitus with hyperglycemia: Secondary | ICD-10-CM | POA: Diagnosis not present

## 2016-10-10 DIAGNOSIS — Z91040 Latex allergy status: Secondary | ICD-10-CM | POA: Insufficient documentation

## 2016-10-10 DIAGNOSIS — J45909 Unspecified asthma, uncomplicated: Secondary | ICD-10-CM | POA: Insufficient documentation

## 2016-10-10 DIAGNOSIS — R102 Pelvic and perineal pain: Secondary | ICD-10-CM | POA: Insufficient documentation

## 2016-10-10 DIAGNOSIS — B379 Candidiasis, unspecified: Secondary | ICD-10-CM | POA: Diagnosis not present

## 2016-10-10 LAB — URINALYSIS, ROUTINE W REFLEX MICROSCOPIC
BACTERIA UA: NONE SEEN
Bilirubin Urine: NEGATIVE
Hgb urine dipstick: NEGATIVE
KETONES UR: NEGATIVE mg/dL
Nitrite: NEGATIVE
PROTEIN: NEGATIVE mg/dL
Specific Gravity, Urine: 1.028 (ref 1.005–1.030)
pH: 6 (ref 5.0–8.0)

## 2016-10-10 LAB — WET PREP, GENITAL
CLUE CELLS WET PREP: NONE SEEN
Sperm: NONE SEEN
Trich, Wet Prep: NONE SEEN
YEAST WET PREP: NONE SEEN

## 2016-10-10 LAB — CBC
HCT: 40.6 % (ref 36.0–46.0)
Hemoglobin: 14.9 g/dL (ref 12.0–15.0)
MCH: 29.5 pg (ref 26.0–34.0)
MCHC: 36.7 g/dL — ABNORMAL HIGH (ref 30.0–36.0)
MCV: 80.4 fL (ref 78.0–100.0)
PLATELETS: 348 10*3/uL (ref 150–400)
RBC: 5.05 MIL/uL (ref 3.87–5.11)
RDW: 13.5 % (ref 11.5–15.5)
WBC: 9 10*3/uL (ref 4.0–10.5)

## 2016-10-10 LAB — POC URINE PREG, ED: PREG TEST UR: NEGATIVE

## 2016-10-10 LAB — BASIC METABOLIC PANEL
ANION GAP: 13 (ref 5–15)
BUN: 13 mg/dL (ref 6–20)
CALCIUM: 9.4 mg/dL (ref 8.9–10.3)
CO2: 20 mmol/L — ABNORMAL LOW (ref 22–32)
CREATININE: 0.64 mg/dL (ref 0.44–1.00)
Chloride: 98 mmol/L — ABNORMAL LOW (ref 101–111)
Glucose, Bld: 392 mg/dL — ABNORMAL HIGH (ref 65–99)
Potassium: 4.1 mmol/L (ref 3.5–5.1)
SODIUM: 131 mmol/L — AB (ref 135–145)

## 2016-10-10 LAB — CBG MONITORING, ED: GLUCOSE-CAPILLARY: 437 mg/dL — AB (ref 65–99)

## 2016-10-10 MED ORDER — METFORMIN HCL 500 MG PO TABS: 500.0000 mg | ORAL_TABLET | Freq: Two times a day (BID) | ORAL | 0 refills | Status: DC

## 2016-10-10 MED ORDER — FLUCONAZOLE 150 MG PO TABS: 150.0000 mg | ORAL_TABLET | Freq: Every day | ORAL | 0 refills | Status: DC

## 2016-10-10 MED ORDER — INSULIN GLARGINE 100 UNIT/ML ~~LOC~~ SOLN: 45.0000 [IU] | Freq: Every day | SUBCUTANEOUS | 0 refills | Status: DC | PRN

## 2016-10-10 MED ORDER — NYSTATIN 100000 UNIT/GM EX CREA: 1.0000 "application " | TOPICAL_CREAM | Freq: Two times a day (BID) | CUTANEOUS | 1 refills | Status: DC

## 2016-10-10 NOTE — ED Notes (Signed)
Patient very upset and screaming in lobby. Patient stirring up dissention in the waiting room stating "This is why I hate this stupid hospital!" and "My blood sugar is in the 500s and they told me Id be the next to go back, but theyre calling all these other people who arent as sick as me!" and  "I can complain with the best of them! Ill scream and shout until someone pays attention to me and gets me a room! I came in on an EMS - I should be seen first!" This nurse attempted to explain to patient that the ER sees patients in order of acuity as well as wait time. This nurse also assured patient that we knew her blood sugar was high and that we would be monitoring it closely. Patient remains verbally aggressive and this nurse notified charge nurse Johnny BridgeMartha. Charge nurse came out to waiting room and explained the ER wait times and apologized to the patients. This patient yelled at charge nurse despite the charge nurses explanation and apology. Patient screams "this [facility] hospital isnt going to charge me a dime for this terrible care! They dont care at all! Ill complain! Ill go all the way up the chain to complain! This is bullshit!" The charge nurse attempted to calm patient, but was unable. Charge nurse excused herself and left. Patient screams "no one has even checked my blood sugar again to see what it is!" This nurse immediately offered to re-check patient blood sugar, which resulted at 427. Patient vital signs were also checked and shown to be stable. Patient provided with x2 cups of ice water, and an ice pack for the pain in her vagina. Patient calmed down as soon as AC walked up and spoke to patient of her complaints. [facility] that she was ever aggressive.

## 2016-10-10 NOTE — Discharge Instructions (Signed)
You were seen in the ED today with a yeast infection over the skin. This is because your blood sugar is much too high. YOu need to begin seeing the local PCP listed for treatment of your high blood sugar.

## 2016-10-10 NOTE — ED Triage Notes (Signed)
Per [facility] pt from home c/o vaginal pain for past 4 months. States she is swollen for past 3 weeks and is having burning sensation with urination. C/o clear discharge with foul odor.CBG 560, pt is non compliant with meds bc her meds do not work.

## 2016-10-10 NOTE — ED Notes (Signed)
Pt is screaming at staff in lobby.  Upset that she was placed in lobby from ambulance.  Pt is not having n/v or altered mental status.  Pt is alert and vitals are stable.  Pt has known cbg elevated.  Here for vaginal complaint.  Will check labs for DKA.  Explained delay to patient.  Notified AC of patient behavior regarding delay.

## 2016-10-10 NOTE — ED Provider Notes (Signed)
Emergency Department Provider Note   I have reviewed the triage vital signs and the nursing notes.   HISTORY  Chief Complaint Vaginal Pain and Hyperglycemia   HPI Evelyn Watts is a 41 y.o. female with PMH of asthma and DM presents to the emergency department for evaluation of vaginal pain and itching for the past 4 months. Patient is also concerned about her high blood sugars. She has been compliant with her metformin and Lantus until recently when she was unable to afford this medication. She states that she is doing fairly well on Victoza prescribed by primary care physician in [facility] but her insurance stopped covering this medication because of travel issues she's been unable to see her [employer] physician. She has not established care with a more local physician. No fevers or chills. Patient reports mainly vaginal pain with some discharge. No dysuria, hesitancy, urgency. No vaginal bleeding. She states she's had multiple exams but no ones able to find a reason for her vaginal pain and discharge. She is sexually active with her boyfriend. No radiation of pain.    Past Medical History:  Diagnosis Date  . Asthma   . Diabetes mellitus without complication (HCC)     There are no active problems to display for this patient.   Past Surgical History:  Procedure Laterality Date  . CESAREAN SECTION     4  . CHOLECYSTECTOMY    . HERNIA REPAIR    . TONSILLECTOMY      Current Outpatient Rx  . Order #: [id number] Class: Print  . Order #: [id number] Class: Historical Med  . Order #: [id number] Class: Historical Med  . Order #: [id number] Class: Print  . Order #: [id number] Class: Print  . Order #: [id number] Class: Print  . Order #: [id number] Class: Print    Allergies Latex; Morphine and related; Percocet [oxycodone-acetaminophen]; and Tape  No family history on file.  Social History Social History  Substance Use Topics  . Smoking status: Never Smoker  . Smokeless tobacco:  Never Used  . Alcohol use No    Review of Systems  Constitutional: No fever/chills. Positive elevated blood sugars.  Eyes: No visual changes. ENT: No sore throat. Cardiovascular: Denies chest pain. Respiratory: Denies shortness of breath. Gastrointestinal: No abdominal pain.  No nausea, no vomiting.  No diarrhea.  No constipation. Genitourinary: Negative for dysuria. Positive vaginal pain and discharge.  Musculoskeletal: Negative for back pain. Skin: Negative for rash. Neurological: Negative for headaches, focal weakness or numbness.  10-point ROS otherwise negative.  ____________________________________________   PHYSICAL EXAM:  VITAL SIGNS: ED Triage Vitals  Enc Vitals Group     BP 10/10/16 1624 102/61     Pulse Rate 10/10/16 1624 87     Resp 10/10/16 1624 16     Temp 10/10/16 1624 98.3 F (36.8 C)     Temp Source 10/10/16 1624 Oral     SpO2 10/10/16 1624 97 %     Weight 10/10/16 1624 280 lb (127 kg)     Height 10/10/16 1624 5\' 11"  (1.803 m)     Pain Score 10/10/16 1623 10   Constitutional: Alert and oriented. Well appearing and in no acute distress. Eyes: Conjunctivae are normal.  Head: Atraumatic. Nose: No congestion/rhinnorhea. Mouth/Throat: Mucous membranes are moist.  Neck: No stridor.  Cardiovascular: Normal rate, regular rhythm. Good peripheral circulation. Grossly normal heart sounds.   Respiratory: Normal respiratory effort.  No retractions. Lungs CTAB. Gastrointestinal: Soft and nontender. No distention.  Genitourinary: Dry, erythematous with white discharge rash  around the vulva and extending to the inguinal creases. Mild/moderate white vaginal discharge. No CMT or adnexal tenderness/fullness.  Musculoskeletal: No lower extremity tenderness nor edema. No gross deformities of extremities. Neurologic:  Normal speech and language. No gross focal neurologic deficits are appreciated.  Skin:  Skin is warm, dry and intact. No rash  noted.  ____________________________________________   LABS (all labs ordered are listed, but only abnormal results are displayed)  Labs Reviewed  WET PREP, GENITAL - Abnormal; Notable for the following:       Result Value   WBC, Wet Prep HPF POC MANY (*)    All other components within normal limits  URINALYSIS, ROUTINE W REFLEX MICROSCOPIC - Abnormal; Notable for the following:    Color, Urine COLORLESS (*)    Glucose, UA >500 (*)    Leukocytes, UA TRACE (*)    Squamous Epithelial / LPF 0-5 (*)    All other components within normal limits  BASIC METABOLIC PANEL - Abnormal; Notable for the following:    Sodium 131 (*)    Chloride 98 (*)    CO2 20 (*)    Glucose, Bld 392 (*)    All other components within normal limits  CBC - Abnormal; Notable for the following:    MCHC 36.7 (*)    All other components within normal limits  CBG MONITORING, ED - Abnormal; Notable for the following:    Glucose-Capillary 437 (*)    All other components within normal limits  POC URINE PREG, ED  GC/CHLAMYDIA PROBE AMP ([place]) [facility]   ____________________________________________   PROCEDURES  Procedure(s) performed:   Procedures  None ____________________________________________   INITIAL IMPRESSION / ASSESSMENT AND PLAN / ED COURSE  Pertinent labs & imaging results that were available during my care of the patient were reviewed by me and considered in my medical decision making (see chart for details).  Patient presents to the emergency department for evaluation of vaginal pain and hyperglycemia. She has no evidence of DKA on labs. Plan to refill her metformin and Lantus. Discussed at length the importance of establishing a local primary care physician who can adjust her diabetes medications. Plan for pelvic exam. Some of her vaginal symptoms may likely be due to her hyperglycemia. Patient understands this. Plan for exam with wet prep, STI testing, reevaluation.  Pelvic exam  as above. Will continue her treatment for candida and sending STD testing. No treatment for STD in the ED. Also refilled her Lantus and Metformin. I spent a Latonda Larrivee time discussing the patient's vaginal symptoms and how this is directly related to her poor blood sugar control. I provided a phone number for [place] with whom she can establish care. Patient with no evidence of DKA by labs or exam. Plan for discharge.   At this time, I do not feel there is any life-threatening condition present. I have reviewed and discussed all results (EKG, imaging, lab, urine as appropriate), exam findings with patient. I have reviewed nursing notes and appropriate previous records.  I feel the patient is safe to be discharged home without further emergent workup. Discussed usual and customary return precautions. Patient and family (if present) verbalize understanding and are comfortable with this plan.  Patient will follow-up with their primary care provider. If they do not have a primary care provider, information for follow-up has been provided to them. All questions have been answered.  Made aware by nursing that the patient left the ED prior to receiving  paperwork, prescriptions, or PCP referral information.  ____________________________________________  FINAL CLINICAL IMPRESSION(S) / ED DIAGNOSES  Final diagnoses:  Vaginal pain  Candida infection  Hyperglycemia     MEDICATIONS GIVEN DURING THIS VISIT:  None  NEW OUTPATIENT MEDICATIONS STARTED DURING THIS VISIT:  Multiple medication refills including Lantus and Metformin.    Note:  This document was prepared using Dragon voice recognition software and may include unintentional dictation errors.  Alona Bene, MD Emergency Medicine   Erilyn Pearman, Arlyss Repress, MD 10/11/16 [phone]

## 2016-10-10 NOTE — ED Notes (Signed)
Patient left before receiving discharge instructions. Went into room and found her gown on the stretcher. Notified Dr. Shela CommonsJ. Long of patients departure.

## 2016-10-11 LAB — GC/CHLAMYDIA PROBE AMP (~~LOC~~) NOT AT ARMC
CHLAMYDIA, DNA PROBE: NEGATIVE
Neisseria Gonorrhea: NEGATIVE

## 2016-11-29 ENCOUNTER — Emergency Department (HOSPITAL_COMMUNITY): Payer: Medicare Other

## 2016-11-29 ENCOUNTER — Encounter (HOSPITAL_COMMUNITY): Payer: Self-pay | Admitting: Emergency Medicine

## 2016-11-29 DIAGNOSIS — R079 Chest pain, unspecified: Secondary | ICD-10-CM | POA: Diagnosis present

## 2016-11-29 DIAGNOSIS — Z5321 Procedure and treatment not carried out due to patient leaving prior to being seen by health care provider: Secondary | ICD-10-CM | POA: Diagnosis not present

## 2016-11-29 LAB — CBC
HCT: 38.7 % (ref 36.0–46.0)
Hemoglobin: 13.8 g/dL (ref 12.0–15.0)
MCH: 28.5 pg (ref 26.0–34.0)
MCHC: 35.7 g/dL (ref 30.0–36.0)
MCV: 80 fL (ref 78.0–100.0)
PLATELETS: 360 10*3/uL (ref 150–400)
RBC: 4.84 MIL/uL (ref 3.87–5.11)
RDW: 13.1 % (ref 11.5–15.5)
WBC: 7.9 10*3/uL (ref 4.0–10.5)

## 2016-11-29 LAB — BASIC METABOLIC PANEL
Anion gap: 12 (ref 5–15)
BUN: 8 mg/dL (ref 6–20)
CALCIUM: 9.7 mg/dL (ref 8.9–10.3)
CO2: 22 mmol/L (ref 22–32)
CREATININE: 0.64 mg/dL (ref 0.44–1.00)
Chloride: 99 mmol/L — ABNORMAL LOW (ref 101–111)
GFR calc non Af Amer: >60 mL/min (ref >60)
GLUCOSE: 391 mg/dL — AB (ref 65–99)
Potassium: 4.1 mmol/L (ref 3.5–5.1)
Sodium: 133 mmol/L — ABNORMAL LOW (ref 135–145)

## 2016-11-29 LAB — I-STAT TROPONIN, ED: TROPONIN I, POC: 0 ng/mL (ref 0.00–0.08)

## 2016-11-29 NOTE — ED Triage Notes (Signed)
Pt arrives to the ED from the bus stop where the pt was sitting on the curb because her chest was hurting. Per pt she started having chest pain for 1 week. Pt is alert and opx4, warm and dry. Pt states she fell yesterday outside while walking and fell back wards and landed on her back.

## 2016-11-30 ENCOUNTER — Emergency Department (HOSPITAL_COMMUNITY)
Admission: EM | Admit: 2016-11-30 | Discharge: 2016-11-30 | Discharge status: Against Medical Advice | Payer: Medicare Other | Attending: Emergency Medicine | Admitting: Emergency Medicine

## 2016-11-30 ENCOUNTER — Ambulatory Visit (HOSPITAL_COMMUNITY): Payer: Medicare Other

## 2016-11-30 ENCOUNTER — Emergency Department (HOSPITAL_COMMUNITY)
Admission: EM | Admit: 2016-11-30 | Discharge: 2016-11-30 | Disposition: A | Discharge status: Home / Self Care | Payer: Medicare Other

## 2016-11-30 DIAGNOSIS — M438X4 Other specified deforming dorsopathies, thoracic region: Secondary | ICD-10-CM

## 2016-11-30 DIAGNOSIS — R079 Chest pain, unspecified: Secondary | ICD-10-CM

## 2016-11-30 DIAGNOSIS — W19XXXA Unspecified fall, initial encounter: Secondary | ICD-10-CM | POA: Insufficient documentation

## 2016-11-30 DIAGNOSIS — M546 Pain in thoracic spine: Secondary | ICD-10-CM | POA: Insufficient documentation

## 2016-11-30 DIAGNOSIS — M549 Dorsalgia, unspecified: Secondary | ICD-10-CM

## 2016-11-30 NOTE — ED Notes (Signed)
Pt was called for vitals re-check, no answer. 

## 2016-12-02 ENCOUNTER — Encounter (HOSPITAL_COMMUNITY): Payer: Self-pay

## 2016-12-02 ENCOUNTER — Ambulatory Visit (INDEPENDENT_AMBULATORY_CARE_PROVIDER_SITE_OTHER): Payer: Medicare Other

## 2016-12-02 ENCOUNTER — Ambulatory Visit (HOSPITAL_COMMUNITY)
Admission: EM | Admit: 2016-12-02 | Discharge: 2016-12-02 | Disposition: A | Discharge status: Home / Self Care | Payer: Medicare Other | Attending: Urgent Care | Admitting: Urgent Care

## 2016-12-02 DIAGNOSIS — M25531 Pain in right wrist: Secondary | ICD-10-CM | POA: Diagnosis not present

## 2016-12-02 DIAGNOSIS — M79631 Pain in right forearm: Secondary | ICD-10-CM | POA: Diagnosis not present

## 2016-12-02 DIAGNOSIS — W19XXXA Unspecified fall, initial encounter: Secondary | ICD-10-CM

## 2016-12-02 DIAGNOSIS — M79641 Pain in right hand: Secondary | ICD-10-CM

## 2016-12-02 MED ORDER — KETOROLAC TROMETHAMINE 60 MG/2ML IM SOLN
INTRAMUSCULAR | Status: AC
  Filled 2016-12-02: qty 2

## 2016-12-02 MED ORDER — KETOROLAC TROMETHAMINE 60 MG/2ML IM SOLN
60.0000 mg | Freq: Once | INTRAMUSCULAR | Status: AC
  Administered 2016-12-02: 60 mg via INTRAMUSCULAR

## 2016-12-02 MED ORDER — CELECOXIB 100 MG PO CAPS: 100.0000 mg | ORAL_CAPSULE | Freq: Two times a day (BID) | ORAL | 0 refills | Status: DC

## 2016-12-02 NOTE — Discharge Instructions (Signed)
For your forearm pain, please use the wrist splint. You can schedule Tylenol 500mg  every 6 hours together with Celebrex.

## 2016-12-02 NOTE — ED Provider Notes (Signed)
MRN: [id number] DOB: July 24, 1975  Subjective:   Evelyn Watts is a 41 y.o. female presenting for chief complaint of Fall  Reports suffering a fall on her back 2 days ago. She has since developed severe right forearm and hand pain. Pain is constant, has intermittent numbness, is sharp in nature, does not radiate. Has tried APAP, ibuprofen, Motrin, Alleve without any relief. Denies breaking her fall with her arm, neck pain, redness, weakness, trauma to her arm. She admits that she was drinking and cannot recall exactly what happened. Denies history RA. Has Type 2 DM, managed with metformin and insulin. Also takes lisinopril for renal protection. Denies history of kidney disease.  Also is allergic to latex; morphine and related; percocet [oxycodone-acetaminophen]; and tape.  Elease Hashimotoatricia  has a past medical history of Asthma and Diabetes mellitus without complication (HCC). Also  has a past surgical history that includes Tonsillectomy; Hernia repair; Cholecystectomy; and Cesarean section.  Objective:   Vitals: BP (!) 156/102 (BP Location: Left Wrist)   Pulse 89   Temp 97.6 F (36.4 C) (Oral)   Resp 16   LMP 11/17/2016 (Exact Date)   SpO2 98%   BP Readings from Last 3 Encounters:  12/02/16 (!) 156/102  11/29/16 127/73  10/10/16 (!) 144/87   Physical Exam  Constitutional: She is oriented to person, place, and time. She appears well-developed and well-nourished.  Cardiovascular: Normal rate.   Pulmonary/Chest: Effort normal.  Musculoskeletal:       Right elbow: She exhibits normal range of motion, no swelling, no effusion, no deformity and no laceration. No tenderness found.       Left elbow: She exhibits normal range of motion, no swelling, no effusion, no deformity and no laceration. No tenderness found.       Right wrist: She exhibits normal range of motion, no tenderness, no bony tenderness, no swelling, no effusion, no crepitus, no deformity and no laceration.       Left wrist: She  exhibits normal range of motion, no tenderness, no bony tenderness, no swelling, no effusion, no crepitus, no deformity and no laceration.       Right forearm: She exhibits tenderness (over distal forearm). She exhibits no bony tenderness, no swelling, no edema, no deformity and no laceration.       Left forearm: She exhibits no tenderness, no bony tenderness, no swelling, no edema, no deformity and no laceration.       Right hand: She exhibits tenderness (over area depicted). She exhibits normal range of motion, no bony tenderness, normal capillary refill, no deformity, no laceration and no swelling. Normal sensation noted. Normal strength noted.       Left hand: She exhibits normal range of motion, no tenderness, no bony tenderness, normal capillary refill, no deformity, no laceration and no swelling. Normal sensation noted. Normal strength noted.       Hands: Neurological: She is alert and oriented to person, place, and time.   Dg Forearm Right  Result Date: 12/02/2016 CLINICAL DATA:  Fall 3 days ago with right forearm and hand pain. Initial encounter. EXAM: RIGHT FOREARM - 2 VIEW COMPARISON:  None. FINDINGS: There is no evidence of fracture or other focal bone lesions. Soft tissues are unremarkable. IMPRESSION: Negative. Electronically Signed   By: Irish LackGlenn  Yamagata M.D.   On: 12/02/2016 20:30   Dg Hand Complete Right  Result Date: 12/02/2016 CLINICAL DATA:  Fall 3 days ago with right forearm and hand pain. Initial encounter. EXAM: RIGHT HAND - COMPLETE 3+ VIEW  COMPARISON:  None. FINDINGS: There is no evidence of fracture or dislocation. There is no evidence of arthropathy or other focal bone abnormality. Soft tissues are unremarkable. IMPRESSION: Negative. Electronically Signed   By: Irish Lack M.D.   On: 12/02/2016 20:31   Assessment and Plan :   1. Arthralgia of right forearm   2. Fall, initial encounter   3. Right hand pain   4. Right wrist pain     Will use wrist brace for  conservative management. Radiology report is reassuring. Patient will use Celebrex for pain and inflammation. Return-to-clinic precautions discussed, patient verbalized understanding.

## 2016-12-02 NOTE — ED Triage Notes (Signed)
Pt was drinking celebrating her marriage and said she fell but thought she landed on her back. But has bruise on right knee, right arm pain and swelling and pain in her left hand as well. Said it's getting to the point she almost cannot use her right arm. Has tried advil, ibuprofen and tylenol without relief.

## 2016-12-16 ENCOUNTER — Encounter (HOSPITAL_COMMUNITY): Payer: Self-pay | Admitting: Emergency Medicine

## 2016-12-16 ENCOUNTER — Emergency Department (HOSPITAL_COMMUNITY)
Admission: EM | Admit: 2016-12-16 | Discharge: 2016-12-16 | Disposition: A | Discharge status: Home / Self Care | Payer: Medicare Other | Attending: Emergency Medicine | Admitting: Emergency Medicine

## 2016-12-16 DIAGNOSIS — B373 Candidiasis of vulva and vagina: Secondary | ICD-10-CM | POA: Diagnosis not present

## 2016-12-16 DIAGNOSIS — J45909 Unspecified asthma, uncomplicated: Secondary | ICD-10-CM | POA: Diagnosis not present

## 2016-12-16 DIAGNOSIS — R102 Pelvic and perineal pain: Secondary | ICD-10-CM | POA: Insufficient documentation

## 2016-12-16 DIAGNOSIS — Z794 Long term (current) use of insulin: Secondary | ICD-10-CM | POA: Diagnosis not present

## 2016-12-16 DIAGNOSIS — Z79899 Other long term (current) drug therapy: Secondary | ICD-10-CM | POA: Insufficient documentation

## 2016-12-16 DIAGNOSIS — E1165 Type 2 diabetes mellitus with hyperglycemia: Secondary | ICD-10-CM | POA: Insufficient documentation

## 2016-12-16 DIAGNOSIS — Z91199 Patient's noncompliance with other medical treatment and regimen due to unspecified reason: Secondary | ICD-10-CM

## 2016-12-16 DIAGNOSIS — R350 Frequency of micturition: Secondary | ICD-10-CM | POA: Insufficient documentation

## 2016-12-16 DIAGNOSIS — Z91040 Latex allergy status: Secondary | ICD-10-CM | POA: Insufficient documentation

## 2016-12-16 DIAGNOSIS — Z9119 Patient's noncompliance with other medical treatment and regimen: Secondary | ICD-10-CM | POA: Insufficient documentation

## 2016-12-16 DIAGNOSIS — B3731 Acute candidiasis of vulva and vagina: Secondary | ICD-10-CM

## 2016-12-16 LAB — BASIC METABOLIC PANEL
Anion gap: 10 (ref 5–15)
BUN: 9 mg/dL (ref 6–20)
CHLORIDE: 101 mmol/L (ref 101–111)
CO2: 24 mmol/L (ref 22–32)
CREATININE: 0.82 mg/dL (ref 0.44–1.00)
Calcium: 9.4 mg/dL (ref 8.9–10.3)
Glucose, Bld: 415 mg/dL — ABNORMAL HIGH (ref 65–99)
POTASSIUM: 4.4 mmol/L (ref 3.5–5.1)
SODIUM: 135 mmol/L (ref 135–145)

## 2016-12-16 LAB — CBC
HEMATOCRIT: 37.6 % (ref 36.0–46.0)
Hemoglobin: 13.3 g/dL (ref 12.0–15.0)
MCH: 28.7 pg (ref 26.0–34.0)
MCHC: 35.4 g/dL (ref 30.0–36.0)
MCV: 81.2 fL (ref 78.0–100.0)
PLATELETS: 319 10*3/uL (ref 150–400)
RBC: 4.63 MIL/uL (ref 3.87–5.11)
RDW: 13.9 % (ref 11.5–15.5)
WBC: 7 10*3/uL (ref 4.0–10.5)

## 2016-12-16 LAB — URINALYSIS, ROUTINE W REFLEX MICROSCOPIC
BILIRUBIN URINE: NEGATIVE
KETONES UR: NEGATIVE mg/dL
LEUKOCYTES UA: NEGATIVE
NITRITE: NEGATIVE
PH: 6 (ref 5.0–8.0)
PROTEIN: NEGATIVE mg/dL
Specific Gravity, Urine: 1.036 — ABNORMAL HIGH (ref 1.005–1.030)

## 2016-12-16 LAB — CBG MONITORING, ED: Glucose-Capillary: 435 mg/dL — ABNORMAL HIGH (ref 65–99)

## 2016-12-16 LAB — PREGNANCY, URINE: Preg Test, Ur: NEGATIVE

## 2016-12-16 MED ORDER — HYDROCODONE-ACETAMINOPHEN 5-325 MG PO TABS
1.0000 | ORAL_TABLET | Freq: Once | ORAL | Status: AC
  Administered 2016-12-16: 1 via ORAL
  Filled 2016-12-16: qty 1

## 2016-12-16 MED ORDER — FLUCONAZOLE 150 MG PO TABS: 150.0000 mg | ORAL_TABLET | Freq: Every day | ORAL | 0 refills | Status: AC

## 2016-12-16 MED ORDER — NYSTATIN 100000 UNIT/GM EX POWD: CUTANEOUS | 2 refills | Status: DC

## 2016-12-16 MED ORDER — FLUCONAZOLE 100 MG PO TABS
150.0000 mg | ORAL_TABLET | Freq: Once | ORAL | Status: AC
  Administered 2016-12-16: 150 mg via ORAL
  Filled 2016-12-16: qty 2

## 2016-12-16 NOTE — ED Triage Notes (Signed)
Pt arrives via ambulance for c/o hyperglycemia and vaginal swelling and discharge, reports vaginal pain and discharge has been going on x4 months. States she was unable to get her victoza for her diabetes since she moved down here.

## 2016-12-16 NOTE — ED Notes (Signed)
Pt unable to sign had to wait outside room due to EMS pt stable and states understanding follow up and medications

## 2016-12-16 NOTE — ED Provider Notes (Signed)
[facility]-EMERGENCY DEPT Provider Note   CSN: [id number] Arrival date & time: 12/16/16  1041     History   Chief Complaint Chief Complaint  Patient presents with  . Hyperglycemia  . Vaginal Pain    HPI Evelyn Watts is a 41 y.o. female with a PMHx of DM2 and asthma, who presents to the ED with complaints of persistent vaginal itching, swelling, maceration of the skin, and thick white vaginal discharge x4 months, as well as poorly controlled blood sugars. Patient states that she has been having intense vaginal itching for 4 months, has noticed that her labia and vulva are swollen and have macerated skin, she has tried different feminine washes, powders, and creams with no help. She has been using an over-the-counter irritation cream which has helped with the macerated areas however the itching and swelling persist. Her symptoms worsen with activity or sitting down (since that irritates her vulva/vagina). She denies any recent changes in feminine products, and douches, or recent antibiotics. She also continues to have thick white vaginal discharge. Chart review reveals that she was seen in the ED on 10/10/16 for similar complaints, had pelvic exam which revealed white discharge extending onto the vulva, no CMT or other concerning findings, wet prep was unremarkable, GC/CT testing was subsequently negative, U/A WNL, Upreg neg, labs showed hyperglycemia but no evidence of DKA. Pt was given nystatin cream and advised to f/up with PCP, diabetes med refills were provided to pt by EDP that day. Pt states that she never got the rx for nystatin cream so she never used this. She states her sugars consistently run in the 400s and endorses polyuria despite taking her metformin as directed. She reports that she runs out of her lantus because the ED only provides one vial of meds when she comes, which runs out quickly. She last took it 2 days ago, and has since run out. She was also supposed to be on victoza but she  needs a prior auth for this medication, and she hasn't gotten this, so she hasn't had the medication in over 1 year. She has not followed up with her PCP ([facility]) since 05/24/16, and hasn't seen them for the vaginal issues; she has a home health RN that comes to her house, and thought that this RN was supposed to get her the medications. She didn't know that her PCP is the one that would need to rx any meds, and do any prior auths. She is sexually active with her husband, unprotected. She's currently on her menses. She denies any fevers, chills, CP, SOB, abd pain, N/V/D/C, hematuria, dysuria, genital sores/lesions aside from the macerated areas, myalgias, arthralgias, numbness, tingling, focal weakness, vision changes, lightheadedness, or any other complaints at this time.    The history is provided by the patient and medical records. No language interpreter was used.  Hyperglycemia  Associated symptoms: polyuria   Associated symptoms: no abdominal pain, no chest pain, no confusion, no dysuria, no fever, no nausea, no shortness of breath, no vomiting and no weakness   Vaginal Pain  This is a chronic problem. The current episode started more than 1 week ago. The problem occurs constantly. The problem has not changed since onset.Pertinent negatives include no chest pain, no abdominal pain and no shortness of breath. Exacerbated by: activity. Relieved by: OTC irritation cream. Treatments tried: OTC irritation cream, powders, feminine soaps. The treatment provided mild relief.    Past Medical History:  Diagnosis Date  . Asthma   .  Diabetes mellitus without complication (HCC)     There are no active problems to display for this patient.   Past Surgical History:  Procedure Laterality Date  . CESAREAN SECTION     4  . CHOLECYSTECTOMY    . HERNIA REPAIR    . TONSILLECTOMY      OB History    No data available       Home Medications    Prior to Admission medications     Medication Sig Start Date End Date Taking? Authorizing Provider  albuterol (PROVENTIL HFA;VENTOLIN HFA) 108 (90 Base) MCG/ACT inhaler Inhale 1-2 puffs into the lungs every 6 (six) hours as needed for wheezing or shortness of breath. 09/18/16   Ward, Chase Picket, PA-C  atorvastatin (LIPITOR) 20 MG tablet Take 20 mg by mouth at bedtime.     [provider]  celecoxib (CELEBREX) 100 MG capsule Take 1 capsule (100 mg total) by mouth 2 (two) times daily. 12/02/16   Wallis Bamberg, PA-C  fluconazole (DIFLUCAN) 150 MG tablet Take 1 tablet (150 mg total) by mouth daily. 10/10/16   Long, Arlyss Repress, MD  insulin glargine (LANTUS) 100 UNIT/ML injection Inject 0.45 mLs (45 Units total) into the skin daily as needed (for high blood sugar). 10/10/16   Long, Arlyss Repress, MD  lisinopril (PRINIVIL,ZESTRIL) 10 MG tablet Take 10 mg by mouth daily.    [provider]  metFORMIN (GLUCOPHAGE) 500 MG tablet Take 1 tablet (500 mg total) by mouth 2 (two) times daily. 10/10/16 12/02/16  Long, Arlyss Repress, MD  nystatin cream (MYCOSTATIN) Apply 1 application topically 2 (two) times daily. 10/10/16   Long, Arlyss Repress, MD    Family History No family history on file.  Social History Social History  Substance Use Topics  . Smoking status: Never Smoker  . Smokeless tobacco: Never Used  . Alcohol use No     Allergies   Latex; Morphine and related; Percocet [oxycodone-acetaminophen]; and Tape   Review of Systems Review of Systems  Constitutional: Negative for chills and fever.  Eyes: Negative for visual disturbance.  Respiratory: Negative for shortness of breath.   Cardiovascular: Negative for chest pain.  Gastrointestinal: Negative for abdominal pain, constipation, diarrhea, nausea and vomiting.  Endocrine: Positive for polyuria.  Genitourinary: Positive for vaginal bleeding (on menses), vaginal discharge and vaginal pain. Negative for dysuria, genital sores and hematuria.  Musculoskeletal: Negative for  arthralgias and myalgias.  Skin: Negative for color change.  Allergic/Immunologic: Positive for immunocompromised state (DM2).  Neurological: Negative for weakness, light-headedness and numbness.  Psychiatric/Behavioral: Negative for confusion.   All other systems reviewed and are negative for acute change except as noted in the HPI.    Physical Exam Updated Vital Signs BP (!) 117/96 (BP Location: Left Arm)   Pulse 97   Temp 98 F (36.7 C) (Oral)   Resp 18   Ht 5\' 11"  (1.803 m)   Wt 127 kg (280 lb)   LMP 12/16/2016 (Exact Date)   SpO2 98%   BMI 39.05 kg/m   Physical Exam  Constitutional: She is oriented to person, place, and time. Vital signs are normal. She appears well-developed and well-nourished.  Non-toxic appearance. No distress.  Afebrile, nontoxic, NAD  HENT:  Head: Normocephalic and atraumatic.  Mouth/Throat: Oropharynx is clear and moist and mucous membranes are normal.  Eyes: Conjunctivae and EOM are normal. Right eye exhibits no discharge. Left eye exhibits no discharge.  Neck: Normal range of motion. Neck supple.  Cardiovascular: Normal rate, regular  rhythm, normal heart sounds and intact distal pulses.  Exam reveals no gallop and no friction rub.   No murmur heard. Pulmonary/Chest: Effort normal and breath sounds normal. No respiratory distress. She has no decreased breath sounds. She has no wheezes. She has no rhonchi. She has no rales.  Abdominal: Soft. Normal appearance and bowel sounds are normal. She exhibits no distension. There is no tenderness. There is no rigidity, no rebound, no guarding, no CVA tenderness, no tenderness at McBurney's point and negative Murphy's sign.  Genitourinary: Pelvic exam was performed with patient prone. There is rash and tenderness on the right labia. There is rash and tenderness on the left labia. There is erythema in the vagina. Vaginal discharge found.  Genitourinary Comments: Chaperone present for exam.  Pt on menses therefore  menstrual blood noted on labia and at introitus. Macerated erythematous skin noted on labia/vulva and along inguinal folds bilaterally, R>L, which is mildly TTP; thick creamy white vaginal discharge noted at introitus and extending onto vulva/labia. No other lesions/sores noted, no ulcerations or vesicles. Full bimanual/speculum pelvic examination not performed.   Musculoskeletal: Normal range of motion.  Neurological: She is alert and oriented to person, place, and time. She has normal strength. No sensory deficit.  Skin: Skin is warm, dry and intact. No rash noted.  Psychiatric: She has a normal mood and affect.  Nursing note and vitals reviewed.    ED Treatments / Results  Labs (all labs ordered are listed, but only abnormal results are displayed) Labs Reviewed  BASIC METABOLIC PANEL - Abnormal; Notable for the following:       Result Value   Glucose, Bld 415 (*)    All other components within normal limits  URINALYSIS, ROUTINE W REFLEX MICROSCOPIC - Abnormal; Notable for the following:    Color, Urine STRAW (*)    Specific Gravity, Urine 1.036 (*)    Glucose, UA >=500 (*)    Hgb urine dipstick LARGE (*)    Bacteria, UA RARE (*)    Squamous Epithelial / LPF 0-5 (*)    All other components within normal limits  CBG MONITORING, ED - Abnormal; Notable for the following:    Glucose-Capillary 435 (*)    All other components within normal limits  CBC  PREGNANCY, URINE    Gonorrhea and chlamydia testing 10/08/16: negative   EKG  EKG Interpretation None       Radiology No results found.  Procedures Procedures (including critical care time)  Medications Ordered in ED Medications  HYDROcodone-acetaminophen (NORCO/VICODIN) 5-325 MG per tablet 1 tablet (1 tablet Oral Given 12/16/16 1524)  fluconazole (DIFLUCAN) tablet 150 mg (150 mg Oral Given 12/16/16 1601)     Initial Impression / Assessment and Plan / ED Course  I have reviewed the triage vital signs and the nursing  notes.  Pertinent labs & imaging results that were available during my care of the patient were reviewed by me and considered in my medical decision making (see chart for details).     41 y.o. female here with ongoing vaginal pain, itching, macerated skin, and thick white d/c x4 months, as well as persistent hyperglycemia due to poor compliance on diabetes regimen. Was seen here 2 months ago for same, had pelvic exam that revealed findings c/w yeast vaginitis, given nystatin cream but pt states she never received the rx. Her GC/CT testing was neg that day, as were here U/A and Upreg, and wet prep was unremarkable. Med refills given that day, but pt hasn't  f/up with PCP so she runs out of lantus and then ends up back in the ED for refills. Has home health aid but didn't understand that she needs her PCP to rx these meds, as the home health nurse is unable to do this. Pt states victoza just needs prior auth, but hasn't gone to PCP for that either because she didn't know that's who had to do it. On exam today, macerated skin to inguinal folds, erythematous with creamy white discharge at vaginal opening, vulva/labia mildly TTP; full pelvic not performed. Pt on menses. U/A unremarkable aside from Hgb likely from menstrual blood; no evidence of UTI. BMP with gluc 415, consistent with prior, no bicarb/anion gap changes, no evidence of DKA. CBC WNL. Will add on Upreg, if neg then will treat with diflucan and send home with course of this to help treat more intensely this persistent yeast infection. Discussed that she's getting yeast because of her hyperglycemia, and MUST get better control of her diabetes; advised close f/up with PCP in order to get that done. Stay hydrated, warm sitz baths advised, tylenol/motrin for pain, discussed keeping area very dry, will rx diflucan course x6 days beyond today, and also rx nystatin powder. Doubt need for further emergent work up at this time. Will await Upreg and then reassess.    4:35 PM Upreg neg. Diflucan dose given here. Will d/c with previously outlined plan. I explained the diagnosis and have given explicit precautions to return to the ER including for any other new or worsening symptoms. The patient understands and accepts the medical plan as it's been dictated and I have answered their questions. Discharge instructions concerning home care and prescriptions have been given. The patient is STABLE and is discharged to home in good condition.    Final Clinical Impressions(s) / ED Diagnoses   Final diagnoses:  Vaginal pain  Yeast vaginitis  Type 2 diabetes mellitus with hyperglycemia, with long-term current use of insulin (HCC)  Noncompliance with diabetes treatment    New Prescriptions New Prescriptions   FLUCONAZOLE (DIFLUCAN) 150 MG TABLET    Take 1 tablet (150 mg total) by mouth daily. Starting 12/17/16   NYSTATIN (MYCOSTATIN/NYSTOP) POWDER    Apply to affected area three times daily until resolution of yeast infection.     [address], [place], [place] 12/16/16 1635    Melene Plan, DO 12/19/16 [phone]

## 2016-12-16 NOTE — Discharge Instructions (Signed)
Your symptoms are due to the fact that your sugars are running high consistently. It's very important that you follow up with your regular doctor in order to get better control of your diabetes. Take your medications as directed. Stay well hydrated. Take diflucan as directed and until completed, starting tomorrow since you received today's dose here in the ER. Use nystatin powder as directed. Keep your vaginal area dry, changing any undergarments if they get damp or wet. Perform warm sitz baths as needed for pain, making sure to dry well after you get out of the bath. Alternate between tylenol and motrin as needed for pain. Follow up with your regular doctor in 5-7 days for recheck of symptoms and for ongoing management of your medical problems. Return to the ER for emergent changes or worsening symptoms.

## 2016-12-19 ENCOUNTER — Telehealth: Payer: Self-pay | Admitting: Emergency Medicine

## 2016-12-19 NOTE — Telephone Encounter (Signed)
CM consulted for PCP follow up. Called pt with appointment time for Thurs Sept 13th at 9:00 am [facility].  Pt took all information down and was appreciative of the appointment.  No further CM needs noted at this time.

## 2017-01-05 ENCOUNTER — Ambulatory Visit: Payer: Medicare Other | Admitting: Family Medicine

## 2017-03-04 ENCOUNTER — Encounter (HOSPITAL_COMMUNITY): Payer: Self-pay | Admitting: Emergency Medicine

## 2017-03-04 ENCOUNTER — Emergency Department (HOSPITAL_COMMUNITY)
Admission: EM | Admit: 2017-03-04 | Discharge: 2017-03-04 | Disposition: A | Discharge status: Home / Self Care | Payer: Medicare Other | Attending: Emergency Medicine | Admitting: Emergency Medicine

## 2017-03-04 DIAGNOSIS — J45909 Unspecified asthma, uncomplicated: Secondary | ICD-10-CM | POA: Diagnosis not present

## 2017-03-04 DIAGNOSIS — E119 Type 2 diabetes mellitus without complications: Secondary | ICD-10-CM | POA: Insufficient documentation

## 2017-03-04 DIAGNOSIS — N3000 Acute cystitis without hematuria: Secondary | ICD-10-CM | POA: Insufficient documentation

## 2017-03-04 DIAGNOSIS — Z79899 Other long term (current) drug therapy: Secondary | ICD-10-CM | POA: Insufficient documentation

## 2017-03-04 DIAGNOSIS — Z91040 Latex allergy status: Secondary | ICD-10-CM | POA: Insufficient documentation

## 2017-03-04 DIAGNOSIS — L309 Dermatitis, unspecified: Secondary | ICD-10-CM

## 2017-03-04 DIAGNOSIS — R102 Pelvic and perineal pain: Secondary | ICD-10-CM | POA: Diagnosis present

## 2017-03-04 DIAGNOSIS — Z794 Long term (current) use of insulin: Secondary | ICD-10-CM | POA: Diagnosis not present

## 2017-03-04 LAB — WET PREP, GENITAL
CLUE CELLS WET PREP: NONE SEEN
SPERM: NONE SEEN
TRICH WET PREP: NONE SEEN
YEAST WET PREP: NONE SEEN

## 2017-03-04 LAB — CBG MONITORING, ED: Glucose-Capillary: 369 mg/dL — ABNORMAL HIGH (ref 65–99)

## 2017-03-04 NOTE — ED Triage Notes (Addendum)
Pt reports brown/yellow/white vaginal discharge for the past 8 months that has gradually gotten worse. Pt reports now pain is in pelvis and R hip. LMP a month ago. Pt reports she has been seen by multiple providers for this, but has not seen an OB/GYN. Pt also reports R side CP from hip for over a month. NSR with EMS. CP when moving R side or breathing deeply. CBG 319 with EMS. Pt reports she has been struggling with her type 2 DM since her medications were changed a year ago.

## 2017-03-04 NOTE — ED Notes (Signed)
Bed: WA20 Expected date:  Expected time:  Means of arrival:  Comments: abd pain, vag swelling and discharge

## 2017-03-04 NOTE — ED Provider Notes (Signed)
[facility]-EMERGENCY DEPT Provider Note   CSN: [id number] Arrival date & time: 03/04/17  1134     History   Chief Complaint Chief Complaint  Patient presents with  . Vaginal Discharge  . Chest Pain    HPI Evelyn Watts is a 41 y.o. female.  HPI Patient is a 41 year old female presents the emergency department complaining of worsening vaginal and external genitalia pain over the past 8 months.  She states she is intermittently been to the emergency department prescribed multiple medications including antifungal and antibacterial medications.  She states nothing seems to improve this.  She has not seen a gynecologist at this point.  She denies any vaginal discharge.  No new sexual contacts.  Denies fevers and chills.  No nausea vomiting.  Denies significant abdominal pain.  Some dysuria without urinary frequency.  Symptoms are mild to moderate in severity    Past Medical History:  Diagnosis Date  . Asthma   . Diabetes mellitus without complication (HCC)     There are no active problems to display for this patient.   Past Surgical History:  Procedure Laterality Date  . CESAREAN SECTION     4  . CHOLECYSTECTOMY    . HERNIA REPAIR    . TONSILLECTOMY      OB History    No data available       Home Medications    Prior to Admission medications   Medication Sig Start Date End Date Taking? Authorizing Provider  albuterol (PROVENTIL HFA;VENTOLIN HFA) 108 (90 Base) MCG/ACT inhaler Inhale 1-2 puffs into the lungs every 6 (six) hours as needed for wheezing or shortness of breath. 09/18/16   Ward, Chase PicketJaime Pilcher, PA-C  atorvastatin (LIPITOR) 20 MG tablet Take 20 mg by mouth at bedtime.     [provider]  celecoxib (CELEBREX) 100 MG capsule Take 1 capsule (100 mg total) by mouth 2 (two) times daily. 12/02/16   Wallis BambergMani, Mario, PA-C  fluconazole (DIFLUCAN) 150 MG tablet Take 1 tablet (150 mg total) by mouth daily. 10/10/16   Long, Arlyss RepressJoshua G, MD    ibuprofen (ADVIL,MOTRIN) 200 MG tablet Take 400 mg by mouth every 6 (six) hours as needed for mild pain or cramping.    [provider]  insulin glargine (LANTUS) 100 UNIT/ML injection Inject 0.45 mLs (45 Units total) into the skin daily as needed (for high blood sugar). 10/10/16   Long, Arlyss RepressJoshua G, MD  lisinopril (PRINIVIL,ZESTRIL) 10 MG tablet Take 10 mg by mouth daily.    [provider]  metFORMIN (GLUCOPHAGE) 500 MG tablet Take 1 tablet (500 mg total) by mouth 2 (two) times daily. 10/10/16 12/16/16  Maia PlanLong, Joshua G, MD  nystatin (MYCOSTATIN/NYSTOP) powder Apply to affected area three times daily until resolution of yeast infection. 12/16/16   Street, HelenaMercedes, PA-C  nystatin cream (MYCOSTATIN) Apply 1 application topically 2 (two) times daily. 10/10/16   Long, Arlyss RepressJoshua G, MD    Family History History reviewed. No pertinent family history.  Social History Social History   Tobacco Use  . Smoking status: Never Smoker  . Smokeless tobacco: Never Used  Substance Use Topics  . Alcohol use: No  . Drug use: No     Allergies   Latex; Morphine and related; Percocet [oxycodone-acetaminophen]; and Tape   Review of Systems Review of Systems  All other systems reviewed and are negative.    Physical Exam Updated Vital Signs BP 128/82   Pulse 96   Temp 98.3 F (36.8 C) (  Oral)   Resp 16   SpO2 96%   Physical Exam  Constitutional: She is oriented to person, place, and time. She appears well-developed and well-nourished.  HENT:  Head: Normocephalic.  Eyes: EOM are normal.  Neck: Normal range of motion.  Pulmonary/Chest: Effort normal.  Abdominal: She exhibits no distension.  Genitourinary:  Genitourinary Comments: Rod mild erythema of the external genitalia.  No signs of labial abscess.  No signs of cellulitis.  Chaperone present.  Pelvic exam demonstrates no significant vaginal discharge.  No cervical motion tenderness.  No cervical discharge or lesions noted   Musculoskeletal: Normal range of motion.  Neurological: She is alert and oriented to person, place, and time.  Psychiatric: She has a normal mood and affect.  Nursing note and vitals reviewed.    ED Treatments / Results  Labs (all labs ordered are listed, but only abnormal results are displayed) Labs Reviewed  CBG MONITORING, ED - Abnormal; Notable for the following components:      Result Value   Glucose-Capillary 369 (*)    All other components within normal limits  WET PREP, GENITAL  GC/CHLAMYDIA PROBE AMP ([place]    EKG  EKG Interpretation None       Radiology No results found.  Procedures Procedures (including critical care time)  Medications Ordered in ED Medications - No data to display   Initial Impression / Assessment and Plan / ED Course  I have reviewed the triage vital signs and the nursing notes.  Pertinent labs & imaging results that were available during my care of the patient were reviewed by me and considered in my medical decision making (see chart for details).    Suspect much of this is dermatitis likely from moisture and increased skin folds.  Recommended drying her scan recommended barrier cream.  Recommended gynecologic follow-up.  Questionable urinary tract infection.  Patient will be discharged home on Keflex.  Urine culture sent.  Patient understands return to the ER for new or worsening symptoms  Final Clinical Impressions(s) / ED Diagnoses   Final diagnoses:  Dermatitis  Acute cystitis without hematuria    ED Discharge Orders    None       Azalia Bilisampos, Andrei Mccook, MD 03/04/17 1315

## 2017-03-06 LAB — GC/CHLAMYDIA PROBE AMP (~~LOC~~) NOT AT ARMC
CHLAMYDIA, DNA PROBE: NEGATIVE
Neisseria Gonorrhea: NEGATIVE

## 2017-04-06 ENCOUNTER — Encounter (HOSPITAL_COMMUNITY): Payer: Self-pay | Admitting: Emergency Medicine

## 2017-04-06 ENCOUNTER — Emergency Department (HOSPITAL_COMMUNITY): Payer: Medicare Other

## 2017-04-06 ENCOUNTER — Emergency Department (HOSPITAL_COMMUNITY)
Admission: EM | Admit: 2017-04-06 | Discharge: 2017-04-06 | Disposition: A | Discharge status: Home / Self Care | Payer: Medicare Other | Attending: Emergency Medicine | Admitting: Emergency Medicine

## 2017-04-06 DIAGNOSIS — E119 Type 2 diabetes mellitus without complications: Secondary | ICD-10-CM | POA: Diagnosis not present

## 2017-04-06 DIAGNOSIS — Z79899 Other long term (current) drug therapy: Secondary | ICD-10-CM | POA: Diagnosis not present

## 2017-04-06 DIAGNOSIS — J45909 Unspecified asthma, uncomplicated: Secondary | ICD-10-CM | POA: Diagnosis not present

## 2017-04-06 DIAGNOSIS — R079 Chest pain, unspecified: Secondary | ICD-10-CM | POA: Insufficient documentation

## 2017-04-06 DIAGNOSIS — Z794 Long term (current) use of insulin: Secondary | ICD-10-CM | POA: Diagnosis not present

## 2017-04-06 DIAGNOSIS — Z91040 Latex allergy status: Secondary | ICD-10-CM | POA: Insufficient documentation

## 2017-04-06 DIAGNOSIS — R42 Dizziness and giddiness: Secondary | ICD-10-CM | POA: Diagnosis present

## 2017-04-06 LAB — BASIC METABOLIC PANEL
Anion gap: 8 (ref 5–15)
BUN: 19 mg/dL (ref 6–20)
CO2: 21 mmol/L — ABNORMAL LOW (ref 22–32)
Calcium: 9.3 mg/dL (ref 8.9–10.3)
Chloride: 103 mmol/L (ref 101–111)
Creatinine, Ser: 0.54 mg/dL (ref 0.44–1.00)
GFR calc Af Amer: >60 mL/min (ref >60)
GFR calc non Af Amer: >60 mL/min (ref >60)
Glucose, Bld: 288 mg/dL — ABNORMAL HIGH (ref 65–99)
Potassium: 4.1 mmol/L (ref 3.5–5.1)
Sodium: 132 mmol/L — ABNORMAL LOW (ref 135–145)

## 2017-04-06 LAB — CBC
HCT: 41.8 % (ref 36.0–46.0)
Hemoglobin: 15.1 g/dL — ABNORMAL HIGH (ref 12.0–15.0)
MCH: 28.9 pg (ref 26.0–34.0)
MCHC: 36.1 g/dL — ABNORMAL HIGH (ref 30.0–36.0)
MCV: 80.1 fL (ref 78.0–100.0)
Platelets: 405 10*3/uL — ABNORMAL HIGH (ref 150–400)
RBC: 5.22 MIL/uL — ABNORMAL HIGH (ref 3.87–5.11)
RDW: 13 % (ref 11.5–15.5)
WBC: 14 10*3/uL — ABNORMAL HIGH (ref 4.0–10.5)

## 2017-04-06 LAB — I-STAT BETA HCG BLOOD, ED (MC, WL, AP ONLY): I-stat hCG, quantitative: <5 m[IU]/mL (ref <5)

## 2017-04-06 LAB — I-STAT TROPONIN, ED: Troponin i, poc: 0 ng/mL (ref 0.00–0.08)

## 2017-04-06 NOTE — ED Triage Notes (Signed)
Patient adds that started having sharp central chest pains as patient was answering triage questions.

## 2017-04-06 NOTE — ED Triage Notes (Signed)
Per [facility] patient from store for dizziness that started while [facility] thinking might would [facility].  Patient under lot of stress and had some nausea.

## 2017-04-21 NOTE — ED Provider Notes (Signed)
[facility]-EMERGENCY DEPT Provider Note   CSN: [id number] Arrival date & time: 04/06/17  1752     History   Chief Complaint Chief Complaint  Patient presents with  . Dizziness  . Fatigue  . Chest Pain    HPI Evelyn Watts is a 41 y.o. female.  HPI   41 year old female with dizziness.  Onset before arrival.  She was out shopping when she felt dizzy as if she may pass out.  Asked a bystander to call for help.  Symptoms last about 15-20 minutes.  Currently resolved.  No fevers or chills.  No unusual leg pain or swelling.  No nausea.  No urinary complaints.  Felt fine earlier in the day.  Past Medical History:  Diagnosis Date  . Asthma   . Diabetes mellitus without complication (HCC)     There are no active problems to display for this patient.   Past Surgical History:  Procedure Laterality Date  . CESAREAN SECTION     4  . CHOLECYSTECTOMY    . HERNIA REPAIR    . TONSILLECTOMY      OB History    No data available       Home Medications    Prior to Admission medications   Medication Sig Start Date End Date Taking? Authorizing Provider  albuterol (PROVENTIL HFA;VENTOLIN HFA) 108 (90 Base) MCG/ACT inhaler Inhale 1-2 puffs into the lungs every 6 (six) hours as needed for wheezing or shortness of breath. 09/18/16   Ward, Chase PicketJaime Pilcher, PA-C  atorvastatin (LIPITOR) 20 MG tablet Take 20 mg by mouth at bedtime.     [provider]  celecoxib (CELEBREX) 100 MG capsule Take 1 capsule (100 mg total) by mouth 2 (two) times daily. 12/02/16   Wallis BambergMani, Mario, PA-C  fluconazole (DIFLUCAN) 150 MG tablet Take 1 tablet (150 mg total) by mouth daily. 10/10/16   Long, Arlyss RepressJoshua G, MD  ibuprofen (ADVIL,MOTRIN) 200 MG tablet Take 400 mg by mouth every 6 (six) hours as needed for mild pain or cramping.    [provider]  insulin glargine (LANTUS) 100 UNIT/ML injection Inject 0.45 mLs (45 Units total) into the skin daily as needed (for high blood sugar).  10/10/16   Long, Arlyss RepressJoshua G, MD  lisinopril (PRINIVIL,ZESTRIL) 10 MG tablet Take 10 mg by mouth daily.    [provider]  metFORMIN (GLUCOPHAGE) 500 MG tablet Take 1 tablet (500 mg total) by mouth 2 (two) times daily. 10/10/16 12/16/16  Maia PlanLong, Joshua G, MD  nystatin (MYCOSTATIN/NYSTOP) powder Apply to affected area three times daily until resolution of yeast infection. 12/16/16   Street, Eagle RockMercedes, PA-C  nystatin cream (MYCOSTATIN) Apply 1 application topically 2 (two) times daily. 10/10/16   Long, Arlyss RepressJoshua G, MD    Family History No family history on file.  Social History Social History   Tobacco Use  . Smoking status: Never Smoker  . Smokeless tobacco: Never Used  Substance Use Topics  . Alcohol use: No  . Drug use: No     Allergies   Latex; Morphine and related; Percocet [oxycodone-acetaminophen]; and Tape   Review of Systems Review of Systems  All systems reviewed and negative, other than as noted in HPI.  Physical Exam Updated Vital Signs BP (!) 148/97 (BP Location: Left Arm)   Pulse 93   Temp 97.9 F (36.6 C) (Oral)   Resp 12   LMP 03/17/2017   SpO2 100%   Physical Exam  Constitutional: She appears well-developed and well-nourished. No  distress.  HENT:  Head: Normocephalic and atraumatic.  Eyes: Conjunctivae are normal. Right eye exhibits no discharge. Left eye exhibits no discharge.  Neck: Neck supple.  Cardiovascular: Normal rate, regular rhythm and normal heart sounds. Exam reveals no gallop and no friction rub.  No murmur heard. Pulmonary/Chest: Effort normal and breath sounds normal. No respiratory distress.  Abdominal: Soft. She exhibits no distension. There is no tenderness.  Musculoskeletal: She exhibits no edema or tenderness.  Neurological: She is alert.  Skin: Skin is warm and dry.  Psychiatric: She has a normal mood and affect. Her behavior is normal. Thought content normal.  Nursing note and vitals reviewed.    ED Treatments / Results    Labs (all labs ordered are listed, but only abnormal results are displayed) Labs Reviewed  BASIC METABOLIC PANEL - Abnormal; Notable for the following components:      Result Value   Sodium 132 (*)    CO2 21 (*)    Glucose, Bld 288 (*)    All other components within normal limits  CBC - Abnormal; Notable for the following components:   WBC 14.0 (*)    RBC 5.22 (*)    Hemoglobin 15.1 (*)    MCHC 36.1 (*)    Platelets 405 (*)    All other components within normal limits  I-STAT TROPONIN, ED  I-STAT BETA HCG BLOOD, ED ([facility], [facility], [facility] ONLY)    EKG  EKG Interpretation  Date/Time:  Thursday April 06 2017 18:09:17 EST Ventricular Rate:  102 PR Interval:    QRS Duration: 84 QT Interval:  340 QTC Calculation: 443 R Axis:   7 Text Interpretation:  Sinus tachycardia Probable left atrial enlargement No significant change since last tracing Confirmed by Raeford RazorKohut, Keliah Harned [phone]) on 04/06/2017 7:09:49 PM Also confirmed by Raeford RazorKohut, Nanna Ertle [phone]), editor Madalyn RobEverhart, Marilyn [phone]  on 04/07/2017 7:09:12 AM       Radiology No results found.  Procedures Procedures (including critical care time)  Medications Ordered in ED Medications - No data to display   Initial Impression / Assessment and Plan / ED Course  I have reviewed the triage vital signs and the nursing notes.  Pertinent labs & imaging results that were available during my care of the patient were reviewed by me and considered in my medical decision making (see chart for details).     41 year old female with dizziness now resolved.  Hemodynamically stable.  ED workup fairly reassuring.  I doubt emergent process.  Final Clinical Impressions(s) / ED Diagnoses   Final diagnoses:  Dizziness    ED Discharge Orders    None       Raeford RazorKohut, Jas Betten, MD 04/21/17 [phone]

## 2017-04-26 ENCOUNTER — Encounter (HOSPITAL_COMMUNITY): Payer: Self-pay | Admitting: Emergency Medicine

## 2017-04-26 ENCOUNTER — Emergency Department (HOSPITAL_COMMUNITY)
Admission: EM | Admit: 2017-04-26 | Discharge: 2017-04-26 | Disposition: A | Discharge status: Home / Self Care | Payer: Medicare Other | Attending: Emergency Medicine | Admitting: Emergency Medicine

## 2017-04-26 DIAGNOSIS — Z5321 Procedure and treatment not carried out due to patient leaving prior to being seen by health care provider: Secondary | ICD-10-CM | POA: Diagnosis not present

## 2017-04-26 DIAGNOSIS — R51 Headache: Secondary | ICD-10-CM | POA: Diagnosis present

## 2017-04-26 NOTE — ED Notes (Signed)
Pt called for room, no responce 

## 2017-04-26 NOTE — ED Triage Notes (Signed)
Pt BIB EMS- Per EMS, pt called out for right facial/orbital pain. EMS reports pt was saying she was going to lie about how this injury occurred- pt tells EMS that she slipped and fell in the kitchen. Pt from motel. EMS states [employer] was requested to be present at call and  Pt became very angry. EMS states conerns over pt's story and safety through out the call.

## 2017-04-26 NOTE — ED Notes (Signed)
Patient called to be placed in treatment room x2 with no answer. 

## 2017-04-26 NOTE — ED Triage Notes (Addendum)
Patient c/o right sided facial pain after reported "slip and fall in kitchen today." Denies LOC, neck and back pain. Ambulatory. Reports she does not take blood thinners.

## 2017-06-02 ENCOUNTER — Emergency Department (HOSPITAL_COMMUNITY): Payer: Medicare Other

## 2017-06-02 ENCOUNTER — Emergency Department (HOSPITAL_COMMUNITY)
Admission: EM | Admit: 2017-06-02 | Discharge: 2017-06-02 | Disposition: A | Discharge status: Home / Self Care | Payer: Medicare Other | Attending: Emergency Medicine | Admitting: Emergency Medicine

## 2017-06-02 ENCOUNTER — Encounter (HOSPITAL_COMMUNITY): Payer: Self-pay | Admitting: Emergency Medicine

## 2017-06-02 DIAGNOSIS — M25521 Pain in right elbow: Secondary | ICD-10-CM | POA: Diagnosis not present

## 2017-06-02 DIAGNOSIS — N760 Acute vaginitis: Secondary | ICD-10-CM | POA: Diagnosis not present

## 2017-06-02 DIAGNOSIS — J45909 Unspecified asthma, uncomplicated: Secondary | ICD-10-CM | POA: Insufficient documentation

## 2017-06-02 DIAGNOSIS — Z794 Long term (current) use of insulin: Secondary | ICD-10-CM | POA: Diagnosis not present

## 2017-06-02 DIAGNOSIS — M79675 Pain in left toe(s): Secondary | ICD-10-CM

## 2017-06-02 DIAGNOSIS — R102 Pelvic and perineal pain: Secondary | ICD-10-CM | POA: Diagnosis present

## 2017-06-02 DIAGNOSIS — E119 Type 2 diabetes mellitus without complications: Secondary | ICD-10-CM | POA: Diagnosis not present

## 2017-06-02 DIAGNOSIS — B9689 Other specified bacterial agents as the cause of diseases classified elsewhere: Secondary | ICD-10-CM

## 2017-06-02 DIAGNOSIS — Z91040 Latex allergy status: Secondary | ICD-10-CM | POA: Insufficient documentation

## 2017-06-02 LAB — CBC WITH DIFFERENTIAL/PLATELET
Basophils Absolute: 0 K/uL (ref 0.0–0.1)
Basophils Relative: 0 %
Eosinophils Absolute: 0.2 K/uL (ref 0.0–0.7)
Eosinophils Relative: 2 %
HCT: 37 % (ref 36.0–46.0)
Hemoglobin: 12.8 g/dL (ref 12.0–15.0)
Lymphocytes Relative: 28 %
Lymphs Abs: 2.3 K/uL (ref 0.7–4.0)
MCH: 27.9 pg (ref 26.0–34.0)
MCHC: 34.6 g/dL (ref 30.0–36.0)
MCV: 80.6 fL (ref 78.0–100.0)
Monocytes Absolute: 0.4 K/uL (ref 0.1–1.0)
Monocytes Relative: 4 %
Neutro Abs: 5.4 K/uL (ref 1.7–7.7)
Neutrophils Relative %: 66 %
Platelets: 319 K/uL (ref 150–400)
RBC: 4.59 MIL/uL (ref 3.87–5.11)
RDW: 14.2 % (ref 11.5–15.5)
WBC: 8.3 K/uL (ref 4.0–10.5)

## 2017-06-02 LAB — URINALYSIS, ROUTINE W REFLEX MICROSCOPIC
Bilirubin Urine: NEGATIVE
Glucose, UA: >=500 mg/dL — AB
Hgb urine dipstick: NEGATIVE
Ketones, ur: 5 mg/dL — AB
Nitrite: NEGATIVE
Protein, ur: NEGATIVE mg/dL
Specific Gravity, Urine: 1.028 (ref 1.005–1.030)
pH: 6 (ref 5.0–8.0)

## 2017-06-02 LAB — COMPREHENSIVE METABOLIC PANEL WITH GFR
ALT: 18 U/L (ref 14–54)
AST: 22 U/L (ref 15–41)
Albumin: 3.1 g/dL — ABNORMAL LOW (ref 3.5–5.0)
Alkaline Phosphatase: 69 U/L (ref 38–126)
Anion gap: 10 (ref 5–15)
BUN: 14 mg/dL (ref 6–20)
CO2: 23 mmol/L (ref 22–32)
Calcium: 8.8 mg/dL — ABNORMAL LOW (ref 8.9–10.3)
Chloride: 99 mmol/L — ABNORMAL LOW (ref 101–111)
Creatinine, Ser: 0.7 mg/dL (ref 0.44–1.00)
GFR calc Af Amer: >60 mL/min
GFR calc non Af Amer: >60 mL/min
Glucose, Bld: 295 mg/dL — ABNORMAL HIGH (ref 65–99)
Potassium: 4.1 mmol/L (ref 3.5–5.1)
Sodium: 132 mmol/L — ABNORMAL LOW (ref 135–145)
Total Bilirubin: 0.4 mg/dL (ref 0.3–1.2)
Total Protein: 6.4 g/dL — ABNORMAL LOW (ref 6.5–8.1)

## 2017-06-02 LAB — WET PREP, GENITAL
Sperm: NONE SEEN
Trich, Wet Prep: NONE SEEN
Yeast Wet Prep HPF POC: NONE SEEN

## 2017-06-02 LAB — PREGNANCY, URINE: Preg Test, Ur: NEGATIVE

## 2017-06-02 MED ORDER — KETOROLAC TROMETHAMINE 60 MG/2ML IM SOLN
30.0000 mg | Freq: Once | INTRAMUSCULAR | Status: AC
  Administered 2017-06-02: 30 mg via INTRAMUSCULAR
  Filled 2017-06-02: qty 2

## 2017-06-02 MED ORDER — METRONIDAZOLE 500 MG PO TABS
500.0000 mg | ORAL_TABLET | Freq: Once | ORAL | Status: AC
Start: 2017-06-02 — End: 2017-06-02
  Administered 2017-06-02: 500 mg via ORAL
  Filled 2017-06-02: qty 1

## 2017-06-02 MED ORDER — METRONIDAZOLE 500 MG PO TABS: 500.0000 mg | ORAL_TABLET | Freq: Two times a day (BID) | ORAL | 0 refills | Status: DC

## 2017-06-02 MED ORDER — FLUCONAZOLE 150 MG PO TABS: 150.0000 mg | ORAL_TABLET | Freq: Once | ORAL | 0 refills | Status: AC

## 2017-06-02 MED ORDER — NAPROXEN 500 MG PO TABS: 500.0000 mg | ORAL_TABLET | Freq: Two times a day (BID) | ORAL | 0 refills | Status: DC

## 2017-06-02 NOTE — ED Notes (Signed)
ED Provider at bedside. Sheria Langameron, NT present as well for pelvic exam.

## 2017-06-02 NOTE — ED Notes (Signed)
Pt ambulatory to hallway bathroom without any assistance and in NAD.

## 2017-06-02 NOTE — Discharge Instructions (Signed)
You were seen here today for vaginal irritation and discharge, right elbow pain, and left pinky toe pain.  Your xrays were negative for your right elbow and left foot Exam is consistent with tennis elbow.  Please see attached handout and follow instructions.  Please pick up a tennis elbow brace at local pharmacy. Found to have acute vaginosis.  I am prescribing you Flagyl.  Please do not drink while taking this medication can make you very ill. Clinically he appears to have yeast infection.  Please take fluconazole as prescribed. Follow attached instructions.  Please wear post op shoe for comfort. Ice 20 minutes, 3x per day.  Take Naproxen with food as directed for pain relief.  If you develop worsening or new concerning symptoms you can return to the emergency department for re-evaluation.  Follow up with your PCP for further evaluation of your symptoms.  Additional Information:  Your vital signs today were: BP 119/65 (BP Location: Left Arm)    Pulse 83    Temp 98 F (36.7 C) (Oral)    Resp 14    SpO2 100%  If your blood pressure (BP) was elevated above 135/85 this visit, please have this repeated by your doctor within one month. ---------------

## 2017-06-02 NOTE — ED Triage Notes (Signed)
Pt to ER for evaluation of multiple complaints - vaginal pain, right elbow pain, and left pinky toe pain x months. States not sexually active, but has discharge and spotting. Left toe pain began last month, bruising noted, reports "stumped" it last month. States right elbow injury years ago, but pain is worse over 6 months. Pt in NAD

## 2017-06-02 NOTE — ED Notes (Signed)
Pt departed in NAD, refused use of wheelchair.  

## 2017-06-02 NOTE — ED Provider Notes (Signed)
Patient placed in Quick Look pathway, seen and evaluated   Chief Complaint: vaginal pain, R elbow pain, left pinky toe pain  HPI:   42 y.o morbidly obese F presents with 6 months of vaginal pain, dysuria and vaginal discharge. She reports that she has "lacerations" on the sides of her vagina that hurt with wiping. She does occasionally see blood on the toilet paper when she wipes. She is not sexually active. Last menstrual cycle was ?2 weeks ago. She has occasional left sided abdominal pani around prior ventral hernia.  Also complaining of 1 month hx of R elbow pain. No know trauma or injury. Able to move without difficulty. No swelling or skin changes  Pt stubbed her left pinky toe 1 month ago and still having pain.   ROS: + vaginal discharge, dysuria, vaginal pain, R elbow pain, left pinky toe pain,  Negative for fevers, chills.  (one)  Physical Exam:   Gen: No distress  Neuro: Awake and Alert  Skin: Warm    Focused Exam: ABd: mild TTP of left mid abdomen with deep palpation, soft, NABS, no rebound or guarding MSK: TTP over lateral aspect of R elbow without obvious deformities. No decrease ROM. Bruising over left 5th toe.   Will order CBC, BMP, UA, Right elbow xr, left foot XR   Initiation of care has begun. The patient has been counseled on the process, plan, and necessity for staying for the completion/evaluation, and the remainder of the medical screening examination    Dub MikesDowless, Cesareo Vickrey Tripp, PA-C 06/02/17 1534    Pricilla LovelessGoldston, Scott, MD 06/02/17 2216

## 2017-06-02 NOTE — ED Notes (Signed)
Pt asking for pain medication

## 2017-06-02 NOTE — ED Provider Notes (Signed)
[facility] EMERGENCY DEPARTMENT Provider Note   CSN: [id number] Arrival date & time: 06/02/17  1521     History   Chief Complaint Chief Complaint  Patient presents with  . Vaginal Pain  . Elbow Pain  . Toe Pain    HPI Evelyn Watts is a 42 y.o. female with a history of diabetes and morbid obesity who presents the emergency department today for vaginal pain/discharge, right elbow pain and left fifth toe pain.  Patient notes over the last 6 months she has been having vaginal irritation, pruritus, thick cottage cheese like discharge.  She notes that the area has become very irritated and now she has some several small cuts from wiping that cause her to see small amounts of blood on toilet paper.  The patient denies antibiotics prior to onset of symptoms.  She notes this is similar to her prior yeast infections.  No history of STI's.  She is married and in a monogamous relationship but denies sexual activity in the last 6 months.  Last menstrual cycle 2 weeks ago.  She denies any associated fever, chills, nausea/vomiting, abdominal pain, vaginal bleeding, hematuria, urinary urgency, urinary urgency or flank pain.  Patient notes that the due to the irritation she has had dysuria as his symptoms have progressed.  Patient states that over the last 1 month she has had right elbow pain.  She notes that the symptoms are worse with full extension of her right elbow and also with palpation over the radial aspect of her elbow as well as distal tricep.  She has been taking over-the-counter medications such as ibuprofen and this with moderate relief but is concerned as the symptoms have persisted.  She denies any trauma, injury, skin swelling or erythema, fevers, difficulty with range of motion.  Patient notes that one month ago she stubbed her left pinky toe on door.  She notes that she had continued pain with ambulation over this area.  She denies any associated numbness/tingling in  this area.  No open wounds.  HPI  Past Medical History:  Diagnosis Date  . Asthma   . Diabetes mellitus without complication (HCC)     There are no active problems to display for this patient.   Past Surgical History:  Procedure Laterality Date  . CESAREAN SECTION     4  . CHOLECYSTECTOMY    . HERNIA REPAIR    . TONSILLECTOMY      OB History    No data available       Home Medications    Prior to Admission medications   Medication Sig Start Date End Date Taking? Authorizing Provider  albuterol (PROVENTIL HFA;VENTOLIN HFA) 108 (90 Base) MCG/ACT inhaler Inhale 1-2 puffs into the lungs every 6 (six) hours as needed for wheezing or shortness of breath. 09/18/16   Ward, Chase Picket, PA-C  atorvastatin (LIPITOR) 20 MG tablet Take 20 mg by mouth at bedtime.     [provider]  celecoxib (CELEBREX) 100 MG capsule Take 1 capsule (100 mg total) by mouth 2 (two) times daily. 12/02/16   Wallis Bamberg, PA-C  fluconazole (DIFLUCAN) 150 MG tablet Take 1 tablet (150 mg total) by mouth daily. 10/10/16   Long, Arlyss Repress, MD  ibuprofen (ADVIL,MOTRIN) 200 MG tablet Take 400 mg by mouth every 6 (six) hours as needed for mild pain or cramping.    [provider]  insulin glargine (LANTUS) 100 UNIT/ML injection Inject 0.45 mLs (45 Units total) into the skin  daily as needed (for high blood sugar). 10/10/16   Long, Arlyss Repress, MD  lisinopril (PRINIVIL,ZESTRIL) 10 MG tablet Take 10 mg by mouth daily.    [provider]  metFORMIN (GLUCOPHAGE) 500 MG tablet Take 1 tablet (500 mg total) by mouth 2 (two) times daily. 10/10/16 12/16/16  Maia Plan, MD  nystatin (MYCOSTATIN/NYSTOP) powder Apply to affected area three times daily until resolution of yeast infection. 12/16/16   Street, [place], PA-C  nystatin cream (MYCOSTATIN) Apply 1 application topically 2 (two) times daily. 10/10/16   Long, Arlyss Repress, MD    Family History History reviewed. No pertinent family history.  Social  History Social History   Tobacco Use  . Smoking status: Never Smoker  . Smokeless tobacco: Never Used  Substance Use Topics  . Alcohol use: No  . Drug use: No     Allergies   Latex; Morphine and related; Percocet [oxycodone-acetaminophen]; and Tape   Review of Systems Review of Systems  All other systems reviewed and are negative.    Physical Exam Updated Vital Signs BP 124/76 (BP Location: Right Arm)   Pulse 87   Temp 98 F (36.7 C) (Oral)   Resp 18   SpO2 100%   Physical Exam  Constitutional: She appears well-developed and well-nourished.  HENT:  Head: Normocephalic and atraumatic.  Right Ear: External ear normal.  Left Ear: External ear normal.  Nose: Nose normal.  Mouth/Throat: Uvula is midline, oropharynx is clear and moist and mucous membranes are normal. No tonsillar exudate.  Eyes: Pupils are equal, round, and reactive to light. Right eye exhibits no discharge. Left eye exhibits no discharge. No scleral icterus.  Neck: Trachea normal. Neck supple. No spinous process tenderness present. No neck rigidity. Normal range of motion present.  Cardiovascular: Normal rate, regular rhythm and intact distal pulses.  No murmur heard. Pulses:      Radial pulses are 2+ on the right side, and 2+ on the left side.       Dorsalis pedis pulses are 2+ on the right side, and 2+ on the left side.       Posterior tibial pulses are 2+ on the right side, and 2+ on the left side.  No lower extremity swelling or edema. Calves symmetric in size bilaterally.  Pulmonary/Chest: Effort normal and breath sounds normal. She exhibits no tenderness.  Abdominal: Soft. Bowel sounds are normal. She exhibits no distension. There is no tenderness. There is no rigidity, no rebound, no guarding and no CVA tenderness.  Obese abdomen limiting exam.  Genitourinary:  Genitourinary Comments: Exam performed by Jacinto Halim, exam chaperoned Pelvic exam: normal external genitalia without evidence of  trauma. VULVA: normal appearing vulva with no masses, tenderness or lesion. There was redness noted. No evidence of Bartholin cyst abscess. VAGINA: normal appearing vagina with normal color and discharge, no lesions. CERVIX: normal appearing cervix without lesions, cervical motion tenderness absent, cervical os closed with out purulent discharge; vaginal discharge - white, curd-like, thick and vulvar erythema noted, Wet prep and DNA probe for chlamydia and GC obtained.   ADNEXA: normal adnexa in size, nontender and no masses UTERUS: uterus is normal size, shape, consistency and nontender.   Musculoskeletal: She exhibits no edema.       Right shoulder: Normal.       Right wrist: Normal.       Left foot: There is tenderness. There is normal range of motion, no swelling, normal capillary refill, no crepitus, no deformity and no  laceration.       Feet:  RightElbow: Appearance normal. No obvious bony deformity. No skin swelling, erythema, heat, fluctuance or break of the skin. TTP over lateral epicondyle.  No bony tenderness. Active flexion, extension, supination and pronation full and intact.  She notes pain with maximal extension, worsened when wrist is and resisted extension.  Strength able and appropriate for age for flexion and extension.  Radial Pulse 2+. Cap refill <2 seconds. SILT for M/U/R distributions. Compartments soft.   Lymphadenopathy:    She has no cervical adenopathy.  Neurological: She is alert. She has normal strength. No sensory deficit.  Skin: Skin is warm, dry and intact. Capillary refill takes less than 2 seconds. No rash noted. She is not diaphoretic. No erythema.  No skin erythema, heat or drainage.  Minimal ecchymosis noted over the left fifth digit.  Psychiatric: She has a normal mood and affect.  Nursing note and vitals reviewed.    ED Treatments / Results  Labs (all labs ordered are listed, but only abnormal results are displayed) Labs Reviewed  URINALYSIS, ROUTINE  W REFLEX MICROSCOPIC - Abnormal; Notable for the following components:      Result Value   APPearance HAZY (*)    Glucose, UA >=500 (*)    Ketones, ur 5 (*)    Leukocytes, UA MODERATE (*)    Bacteria, UA RARE (*)    Squamous Epithelial / LPF 0-5 (*)    All other components within normal limits  COMPREHENSIVE METABOLIC PANEL - Abnormal; Notable for the following components:   Sodium 132 (*)    Chloride 99 (*)    Glucose, Bld 295 (*)    Calcium 8.8 (*)    Total Protein 6.4 (*)    Albumin 3.1 (*)    All other components within normal limits  PREGNANCY, URINE  CBC WITH DIFFERENTIAL/PLATELET    EKG  EKG Interpretation None       Radiology Dg Elbow 2 Views Right  Result Date: 06/02/2017 CLINICAL DATA:  Pain after trauma 1 month ago. EXAM: RIGHT ELBOW - 2 VIEW COMPARISON:  None. FINDINGS: There is no evidence of fracture, dislocation, or joint effusion. There is no evidence of arthropathy or other focal bone abnormality. Soft tissues are unremarkable. IMPRESSION: Negative. Electronically Signed   By: Gerome Sam III M.D   On: 06/02/2017 16:15   Dg Foot Complete Left  Result Date: 06/02/2017 CLINICAL DATA:  Pain after trauma 1 month ago with bruising. EXAM: LEFT FOOT - COMPLETE 3+ VIEW COMPARISON:  None. FINDINGS: An enthesophyte is seen at the Achilles insertion site on the posterior calcaneus. Mild degenerative changes in the hindfoot. A calcification distal to the medial malleolus is likely from a previous avulsion injury. No definitive fracture in the fifth toe. IMPRESSION: No definitive fracture in the fifth toe. Electronically Signed   By: Gerome Sam III M.D   On: 06/02/2017 16:16    Procedures Procedures (including critical care time)  Medications Ordered in ED Medications  ketorolac (TORADOL) injection 30 mg (30 mg Intramuscular Given 06/02/17 1951)  metroNIDAZOLE (FLAGYL) tablet 500 mg (500 mg Oral Given 06/02/17 2121)     Initial Impression / Assessment and Plan /  ED Course  I have reviewed the triage vital signs and the nursing notes.  Pertinent labs & imaging results that were available during my care of the patient were reviewed by me and considered in my medical decision making (see chart for details).     42 y.o.  female here with vaginal discharge/irritation, right elbow pain and left 5th toe pain.   Vaginal Discharge and Irritation Patient notes in the last several months she has had vaginal irritation, pruritus, thick cottage cheeselike discharge.  No associated fever, abdominal pain, vaginal bleeding or urinary symptoms with this.  Vital signs are reassuring.  Abdomen is soft and nontender.  Pelvic exam consistent with vulvovaginitis suspicious for yeast infection with foul-smelling clear discharge along with white cottage cheeselike discharge.  No concern for PID as the patient is without cervical motion tenderness.  No adnexal tenderness or masses palpated.  Lab work obtained in triage shows patient is without leukocytosis or anemia. Pregnancy test negative. Lipase wnl. CMP reassuring. UA not suggestive of UTI.  Wet prep obtained and consistent for bacterial vaginosis.  Will discharge the patient home on Flagyl for this. Do not suspect intra-abdominal pathology.   Right elbow pain Patient states that over the last 1 month she has had right elbow pain that is worse with full extension of her right elbow as well as palpation.  She has not been taking anything for her symptoms.  No trauma, injury, skin swelling or erythema, fevers, difficulty with range of motion.  Exam is consistent with tennis elbow with pain and maximal extension, worsened by resisted wrist extension and tenderness palpation of the lateral epicondyle.  She is NVI. X-rays negative for fracture.  Patient with intact range of motion, afebrile and without joint swelling or overlying erythema.  Septic joint.  Advised conservative therapy, brace and follow-up with PCP.  Left 5th Toe  Pain Patient notes that one month ago she stubbed her left pinky toe on door.  She notes that she had continued pain with ambulation over this area.  No open wounds or paresthesias with this.  There is a small amount of bruising noted to the left toe.  X-rays negative for fracture or dislocation.  Will place in postop shoe and follow-up with PCP.  I advised the patient to follow-up with PCP this week for this. Specific return precautions discussed. Time was given for all questions to be answered. The patient verbalized understanding and agreement with plan. The patient appears safe for discharge home.   Final Clinical Impressions(s) / ED Diagnoses   Final diagnoses:  Bacterial vaginosis  Pain of toe of left foot  Right elbow pain  Vulvovaginitis    ED Discharge Orders        Ordered    naproxen (NAPROSYN) 500 MG tablet  2 times daily     06/02/17 2129    fluconazole (DIFLUCAN) 150 MG tablet   Once     06/02/17 2129    metroNIDAZOLE (FLAGYL) 500 MG tablet  2 times daily with meals     06/02/17 2129       Princella PellegriniMaczis, Tramaine Snell M, PA-C 06/03/17 David [id number]    Arby BarrettePfeiffer, Marcy, MD 06/04/17 [phone]

## 2017-06-02 NOTE — ED Notes (Signed)
The pt is c/o vaginal pain for 7 months rt arm pain  For weeks and lt little toe pain for  Weeks also

## 2017-06-05 LAB — GC/CHLAMYDIA PROBE AMP (~~LOC~~) NOT AT ARMC
Chlamydia: NEGATIVE
Neisseria Gonorrhea: NEGATIVE

## 2017-06-26 ENCOUNTER — Encounter (HOSPITAL_COMMUNITY): Payer: Self-pay

## 2017-06-26 ENCOUNTER — Other Ambulatory Visit: Payer: Self-pay

## 2017-06-26 DIAGNOSIS — R102 Pelvic and perineal pain: Secondary | ICD-10-CM | POA: Diagnosis present

## 2017-06-26 DIAGNOSIS — B373 Candidiasis of vulva and vagina: Secondary | ICD-10-CM | POA: Insufficient documentation

## 2017-06-26 DIAGNOSIS — Z91040 Latex allergy status: Secondary | ICD-10-CM | POA: Diagnosis not present

## 2017-06-26 DIAGNOSIS — Z79899 Other long term (current) drug therapy: Secondary | ICD-10-CM | POA: Diagnosis not present

## 2017-06-26 DIAGNOSIS — E119 Type 2 diabetes mellitus without complications: Secondary | ICD-10-CM | POA: Diagnosis not present

## 2017-06-26 DIAGNOSIS — R739 Hyperglycemia, unspecified: Secondary | ICD-10-CM | POA: Insufficient documentation

## 2017-06-26 DIAGNOSIS — J45909 Unspecified asthma, uncomplicated: Secondary | ICD-10-CM | POA: Insufficient documentation

## 2017-06-26 LAB — CBC
HCT: 33.2 % — ABNORMAL LOW (ref 36.0–46.0)
Hemoglobin: 11.6 g/dL — ABNORMAL LOW (ref 12.0–15.0)
MCH: 27.6 pg (ref 26.0–34.0)
MCHC: 34.9 g/dL (ref 30.0–36.0)
MCV: 79 fL (ref 78.0–100.0)
PLATELETS: 409 10*3/uL — AB (ref 150–400)
RBC: 4.2 MIL/uL (ref 3.87–5.11)
RDW: 13.9 % (ref 11.5–15.5)
WBC: 8.4 10*3/uL (ref 4.0–10.5)

## 2017-06-26 LAB — BASIC METABOLIC PANEL
Anion gap: 12 (ref 5–15)
BUN: 6 mg/dL (ref 6–20)
CHLORIDE: 98 mmol/L — AB (ref 101–111)
CO2: 23 mmol/L (ref 22–32)
CREATININE: 0.66 mg/dL (ref 0.44–1.00)
Calcium: 8.9 mg/dL (ref 8.9–10.3)
GFR calc Af Amer: >60 mL/min (ref >60)
GFR calc non Af Amer: >60 mL/min (ref >60)
GLUCOSE: 433 mg/dL — AB (ref 65–99)
Potassium: 3.9 mmol/L (ref 3.5–5.1)
Sodium: 133 mmol/L — ABNORMAL LOW (ref 135–145)

## 2017-06-26 LAB — URINALYSIS, ROUTINE W REFLEX MICROSCOPIC
Bacteria, UA: NONE SEEN
Bilirubin Urine: NEGATIVE
Glucose, UA: >=500 mg/dL — AB
Hgb urine dipstick: NEGATIVE
Ketones, ur: 20 mg/dL — AB
Nitrite: NEGATIVE
PROTEIN: NEGATIVE mg/dL
SPECIFIC GRAVITY, URINE: 1.028 (ref 1.005–1.030)
pH: 6 (ref 5.0–8.0)

## 2017-06-26 LAB — I-STAT BETA HCG BLOOD, ED (MC, WL, AP ONLY)

## 2017-06-26 NOTE — ED Triage Notes (Addendum)
Pt BIB [facility] for eval of vaginal pain. Pt reports recently dx'd w/ yeast infection on 2/8. Pt reports pain was resolving, but today is the "worst its been". Pt endorses dysuria, reports vaginal area is "raw w/ lacerations on side and swoled up". Pt reports attempting to have sex with husband earlier today, but states it was too painful and she believes that is what caused her pain. EMS reports BG 448.

## 2017-06-27 ENCOUNTER — Emergency Department (HOSPITAL_COMMUNITY)
Admission: EM | Admit: 2017-06-27 | Discharge: 2017-06-27 | Disposition: A | Discharge status: Home / Self Care | Payer: Medicare Other | Attending: Emergency Medicine | Admitting: Emergency Medicine

## 2017-06-27 DIAGNOSIS — B373 Candidiasis of vulva and vagina: Secondary | ICD-10-CM | POA: Diagnosis not present

## 2017-06-27 DIAGNOSIS — R102 Pelvic and perineal pain unspecified side: Secondary | ICD-10-CM

## 2017-06-27 DIAGNOSIS — R739 Hyperglycemia, unspecified: Secondary | ICD-10-CM

## 2017-06-27 DIAGNOSIS — B3731 Acute candidiasis of vulva and vagina: Secondary | ICD-10-CM

## 2017-06-27 LAB — CBG MONITORING, ED
Glucose-Capillary: 434 mg/dL — ABNORMAL HIGH (ref 65–99)
Glucose-Capillary: 397 mg/dL — ABNORMAL HIGH (ref 65–99)

## 2017-06-27 MED ORDER — FLUCONAZOLE 150 MG PO TABS
150.0000 mg | ORAL_TABLET | Freq: Once | ORAL | Status: AC
  Administered 2017-06-27: 150 mg via ORAL
  Filled 2017-06-27: qty 1

## 2017-06-27 MED ORDER — IBUPROFEN 400 MG PO TABS
600.0000 mg | ORAL_TABLET | Freq: Once | ORAL | Status: AC
  Administered 2017-06-27: 05:00:00 600 mg via ORAL
  Filled 2017-06-27: qty 1

## 2017-06-27 MED ORDER — IBUPROFEN 600 MG PO TABS: 600.0000 mg | ORAL_TABLET | Freq: Four times a day (QID) | ORAL | 0 refills | Status: DC | PRN

## 2017-06-27 MED ORDER — CLOTRIMAZOLE 1 % EX CREA: TOPICAL_CREAM | CUTANEOUS | 0 refills | Status: DC

## 2017-06-27 MED ORDER — INSULIN GLARGINE 100 UNIT/ML ~~LOC~~ SOLN
45.0000 [IU] | Freq: Once | SUBCUTANEOUS | Status: AC
  Administered 2017-06-27: 45 [IU] via SUBCUTANEOUS
  Filled 2017-06-27: qty 0.45

## 2017-06-27 MED ORDER — FLUCONAZOLE 150 MG PO TABS: 150.0000 mg | ORAL_TABLET | Freq: Every day | ORAL | 0 refills | Status: AC

## 2017-06-27 NOTE — ED Notes (Signed)
ED Provider at bedside. 

## 2017-06-27 NOTE — Discharge Instructions (Signed)
Take medications as prescribed. Follow up with a primary care provider of your choice for control of blood sugar and other routine health concerns.

## 2017-06-27 NOTE — ED Provider Notes (Signed)
[facility] EMERGENCY DEPARTMENT Provider Note   CSN: [id number] Arrival date & time: 06/26/17  2155     History   Chief Complaint Chief Complaint  Patient presents with  . Vaginal Pain  . Hyperglycemia    HPI Evelyn Watts is a 42 y.o. female.  Patient presents with complaint of vaginal pain that has progressed over the last several weeks. She was diagnosed with a yeast infection on 06/02/17 and has been using the medication provided without relief. She states the pain is external, includes a red rash and is now causing difficulty with any contact to the area. No vaginal bleeding, abdominal pain, urinary frequency though it is painful when urine makes contact with external vaginal structures.    The history is provided by the patient. No language interpreter was used.  Vaginal Pain  Pertinent negatives include no abdominal pain.  Hyperglycemia  Associated symptoms: no abdominal pain, no fever, no increased thirst and no polyuria     Past Medical History:  Diagnosis Date  . Asthma   . Diabetes mellitus without complication (HCC)     There are no active problems to display for this patient.   Past Surgical History:  Procedure Laterality Date  . CESAREAN SECTION     4  . CHOLECYSTECTOMY    . HERNIA REPAIR    . TONSILLECTOMY      OB History    No data available       Home Medications    Prior to Admission medications   Medication Sig Start Date End Date Taking? Authorizing Provider  albuterol (PROVENTIL HFA;VENTOLIN HFA) 108 (90 Base) MCG/ACT inhaler Inhale 1-2 puffs into the lungs every 6 (six) hours as needed for wheezing or shortness of breath. 09/18/16   Ward, Chase Picket, PA-C  atorvastatin (LIPITOR) 20 MG tablet Take 20 mg by mouth at bedtime.     [provider]  celecoxib (CELEBREX) 100 MG capsule Take 1 capsule (100 mg total) by mouth 2 (two) times daily. 12/02/16   Wallis Bamberg, PA-C  clotrimazole (LOTRIMIN) 1 % cream Apply  to affected area 2 times daily 06/27/17   Elpidio Anis, PA-C  fluconazole (DIFLUCAN) 150 MG tablet Take 1 tablet (150 mg total) by mouth daily for 3 days. 06/27/17 06/30/17  Elpidio Anis, PA-C  ibuprofen (ADVIL,MOTRIN) 600 MG tablet Take 1 tablet (600 mg total) by mouth every 6 (six) hours as needed. 06/27/17   Elpidio Anis, PA-C  insulin glargine (LANTUS) 100 UNIT/ML injection Inject 0.45 mLs (45 Units total) into the skin daily as needed (for high blood sugar). 10/10/16   Long, Arlyss Repress, MD  lisinopril (PRINIVIL,ZESTRIL) 10 MG tablet Take 10 mg by mouth daily.    [provider]  metFORMIN (GLUCOPHAGE) 500 MG tablet Take 1 tablet (500 mg total) by mouth 2 (two) times daily. 10/10/16 12/16/16  Long, Arlyss Repress, MD  metroNIDAZOLE (FLAGYL) 500 MG tablet Take 1 tablet (500 mg total) by mouth 2 (two) times daily with a meal. DO NOT CONSUME ALCOHOL WHILE TAKING THIS MEDICATION. 06/02/17   Maczis, Elmer Sow, PA-C  naproxen (NAPROSYN) 500 MG tablet Take 1 tablet (500 mg total) by mouth 2 (two) times daily. 06/02/17   Maczis, Elmer Sow, PA-C  nystatin (MYCOSTATIN/NYSTOP) powder Apply to affected area three times daily until resolution of yeast infection. 12/16/16   Street, [place], PA-C  nystatin cream (MYCOSTATIN) Apply 1 application topically 2 (two) times daily. 10/10/16   Long, Arlyss Repress, MD  Family History History reviewed. No pertinent family history.  Social History Social History   Tobacco Use  . Smoking status: Never Smoker  . Smokeless tobacco: Never Used  Substance Use Topics  . Alcohol use: No  . Drug use: No     Allergies   Latex; Morphine and related; Percocet [oxycodone-acetaminophen]; and Tape   Review of Systems Review of Systems  Constitutional: Negative for fever.  Gastrointestinal: Negative for abdominal pain.  Endocrine: Negative for polydipsia and polyuria.  Genitourinary: Positive for vaginal pain. Negative for frequency, pelvic pain, vaginal bleeding and vaginal  discharge.  Musculoskeletal: Negative for myalgias.  Skin: Positive for rash.     Physical Exam Updated Vital Signs BP 132/83   Pulse (!) 101   Temp 98.4 F (36.9 C)   Resp 20   Ht 5\' 11"  (1.803 m)   Wt 127 kg (280 lb)   LMP 06/16/2017   SpO2 100%   BMI 39.05 kg/m   Physical Exam  Constitutional: She is oriented to person, place, and time. She appears well-developed and well-nourished.  Neck: Normal range of motion.  Pulmonary/Chest: Effort normal.  Abdominal: There is no tenderness.  Genitourinary:  Genitourinary Comments: External vaginal visualized only as she could not tolerate speculum exam. There is erythematous plaque like rash covering the external vaginal extending to the perineum. No weeping or blistering. Vulvar swelling present. Exquisitely tender.    Neurological: She is alert and oriented to person, place, and time.  Skin: Skin is warm and dry.     ED Treatments / Results  Labs (all labs ordered are listed, but only abnormal results are displayed) Labs Reviewed  BASIC METABOLIC PANEL - Abnormal; Notable for the following components:      Result Value   Sodium 133 (*)    Chloride 98 (*)    Glucose, Bld 433 (*)    All other components within normal limits  CBC - Abnormal; Notable for the following components:   Hemoglobin 11.6 (*)    HCT 33.2 (*)    Platelets 409 (*)    All other components within normal limits  URINALYSIS, ROUTINE W REFLEX MICROSCOPIC - Abnormal; Notable for the following components:   Glucose, UA >=500 (*)    Ketones, ur 20 (*)    Leukocytes, UA TRACE (*)    Squamous Epithelial / LPF 0-5 (*)    All other components within normal limits  CBG MONITORING, ED - Abnormal; Notable for the following components:   Glucose-Capillary 397 (*)    All other components within normal limits  I-STAT BETA HCG BLOOD, ED ([facility], [facility], [facility] ONLY)    EKG  EKG Interpretation None       Radiology No results found.  Procedures Procedures  (including critical care time)  Medications Ordered in ED Medications  insulin glargine (LANTUS) injection 45 Units (not administered)     Initial Impression / Assessment and Plan / ED Course  I have reviewed the triage vital signs and the nursing notes.  Pertinent labs & imaging results that were available during my care of the patient were reviewed by me and considered in my medical decision making (see chart for details).     Patient presents with persistent vaginal infection diagnosed, per patient, last month as yeast. Exam is c/w yeast infection.   The patient reports her blood sugar is not controlled because she has been unable to obtain her victoza since moving from [place] several months ago. She is taking her other medications -  Lantus insulin and Meptormin. No evidence of acidosis. She is felt appropriate for discharge home.   Final Clinical Impressions(s) / ED Diagnoses   Final diagnoses:  Vaginal pain  Vaginal yeast infection  Hyperglycemia    ED Discharge Orders        Ordered    fluconazole (DIFLUCAN) 150 MG tablet  Daily     06/27/17 0401    clotrimazole (LOTRIMIN) 1 % cream     06/27/17 0401    ibuprofen (ADVIL,MOTRIN) 600 MG tablet  Every 6 hours PRN     06/27/17 0401       Elpidio AnisUpstill, Nastassia Bazaldua, PA-C 06/27/17 0416    Horton, Mayer Maskerourtney F, MD 06/27/17 [phone]

## 2017-06-27 NOTE — ED Notes (Addendum)
Pt drinking coke in lobby

## 2017-07-21 ENCOUNTER — Encounter (HOSPITAL_COMMUNITY): Payer: Self-pay

## 2017-07-21 ENCOUNTER — Emergency Department (HOSPITAL_COMMUNITY): Payer: Medicare Other

## 2017-07-21 ENCOUNTER — Emergency Department (HOSPITAL_COMMUNITY)
Admission: EM | Admit: 2017-07-21 | Discharge: 2017-07-21 | Disposition: A | Discharge status: Home / Self Care | Payer: Medicare Other | Attending: Emergency Medicine | Admitting: Emergency Medicine

## 2017-07-21 ENCOUNTER — Other Ambulatory Visit: Payer: Self-pay

## 2017-07-21 DIAGNOSIS — Z79899 Other long term (current) drug therapy: Secondary | ICD-10-CM | POA: Diagnosis not present

## 2017-07-21 DIAGNOSIS — R0602 Shortness of breath: Secondary | ICD-10-CM | POA: Diagnosis present

## 2017-07-21 DIAGNOSIS — J40 Bronchitis, not specified as acute or chronic: Secondary | ICD-10-CM | POA: Diagnosis not present

## 2017-07-21 DIAGNOSIS — Z794 Long term (current) use of insulin: Secondary | ICD-10-CM | POA: Diagnosis not present

## 2017-07-21 DIAGNOSIS — E119 Type 2 diabetes mellitus without complications: Secondary | ICD-10-CM | POA: Diagnosis not present

## 2017-07-21 DIAGNOSIS — J45909 Unspecified asthma, uncomplicated: Secondary | ICD-10-CM | POA: Insufficient documentation

## 2017-07-21 LAB — BASIC METABOLIC PANEL
Anion gap: 9 (ref 5–15)
BUN: 16 mg/dL (ref 6–20)
CO2: 24 mmol/L (ref 22–32)
Calcium: 9.1 mg/dL (ref 8.9–10.3)
Chloride: 100 mmol/L — ABNORMAL LOW (ref 101–111)
Creatinine, Ser: 0.68 mg/dL (ref 0.44–1.00)
GFR calc Af Amer: >60 mL/min (ref >60)
GFR calc non Af Amer: >60 mL/min (ref >60)
Glucose, Bld: 366 mg/dL — ABNORMAL HIGH (ref 65–99)
Potassium: 4.3 mmol/L (ref 3.5–5.1)
Sodium: 133 mmol/L — ABNORMAL LOW (ref 135–145)

## 2017-07-21 LAB — CBC WITH DIFFERENTIAL/PLATELET
Basophils Absolute: 0 10*3/uL (ref 0.0–0.1)
Basophils Relative: 0 %
Eosinophils Absolute: 0.1 10*3/uL (ref 0.0–0.7)
Eosinophils Relative: 1 %
HCT: 36.2 % (ref 36.0–46.0)
Hemoglobin: 12.6 g/dL (ref 12.0–15.0)
Lymphocytes Relative: 10 %
Lymphs Abs: 1 10*3/uL (ref 0.7–4.0)
MCH: 27.8 pg (ref 26.0–34.0)
MCHC: 34.8 g/dL (ref 30.0–36.0)
MCV: 79.7 fL (ref 78.0–100.0)
Monocytes Absolute: 0.1 10*3/uL (ref 0.1–1.0)
Monocytes Relative: 1 %
Neutro Abs: 8.7 10*3/uL — ABNORMAL HIGH (ref 1.7–7.7)
Neutrophils Relative %: 88 %
Platelets: 406 10*3/uL — ABNORMAL HIGH (ref 150–400)
RBC: 4.54 MIL/uL (ref 3.87–5.11)
RDW: 13.6 % (ref 11.5–15.5)
WBC: 9.8 10*3/uL (ref 4.0–10.5)

## 2017-07-21 MED ORDER — ACETAMINOPHEN-CODEINE 120-12 MG/5ML PO SOLN: 10.0000 mL | ORAL | 0 refills | Status: DC | PRN

## 2017-07-21 MED ORDER — ALBUTEROL (5 MG/ML) CONTINUOUS INHALATION SOLN
10.0000 mg/h | INHALATION_SOLUTION | RESPIRATORY_TRACT | Status: AC
  Administered 2017-07-21: 10 mg/h via RESPIRATORY_TRACT
  Filled 2017-07-21: qty 20

## 2017-07-21 MED ORDER — GUAIFENESIN ER 1200 MG PO TB12: 1.0000 | ORAL_TABLET | Freq: Two times a day (BID) | ORAL | 0 refills | Status: DC

## 2017-07-21 MED ORDER — ALBUTEROL SULFATE HFA 108 (90 BASE) MCG/ACT IN AERS
2.0000 | INHALATION_SPRAY | RESPIRATORY_TRACT | Status: DC | PRN
  Administered 2017-07-21: 2 via RESPIRATORY_TRACT

## 2017-07-21 MED ORDER — PREDNISONE 50 MG PO TABS: 50.0000 mg | ORAL_TABLET | Freq: Every day | ORAL | 0 refills | Status: DC

## 2017-07-21 MED ORDER — AEROCHAMBER PLUS FLO-VU MEDIUM MISC
1.0000 | Freq: Once | Status: AC
  Administered 2017-07-21: 1
  Filled 2017-07-21: qty 1

## 2017-07-21 NOTE — Discharge Instructions (Addendum)
Return here as needed. Follow up with a primary doctor. °

## 2017-07-21 NOTE — ED Triage Notes (Signed)
She reports shortness of breath with congested cough x 2 days. She rec'd. Duoneb treatment and Solu Medrol 125mg  IV en route to hospital.

## 2017-07-21 NOTE — ED Notes (Signed)
Patient O2 did not drop below 100% during ambulation

## 2017-07-21 NOTE — ED Provider Notes (Signed)
[facility]-EMERGENCY DEPT Provider Note   CSN: [id number] Arrival date & time: 07/21/17  1834     History   Chief Complaint Chief Complaint  Patient presents with  . Shortness of Breath    HPI Evelyn Watts is a 42 y.o. female.  HPI Patient presents to the emergency department with difficulty with cough that started on Monday.  The patient states she is also having congestion with shortness of breath.  Patient states she does have asthma and does not have an inhaler at this time.  The patient does not have a primary doctor at this time.  Patient states that nothing seemed to make the condition better.  She states that she feels like she got a cold and that this started the symptoms.  Patient states she is also had nasal congestion and drainage.  The patient denies chest pain, headache,blurred vision, neck pain, fever, patient ambulated without difficulty. Call patient, weakness, numbness, dizziness, anorexia, edema, abdominal pain, nausea, vomiting, diarrhea, rash, back pain, dysuria, hematemesis, bloody stool, near syncope, or syncope. Past Medical History:  Diagnosis Date  . Asthma   . Diabetes mellitus without complication (HCC)     There are no active problems to display for this patient.   Past Surgical History:  Procedure Laterality Date  . CESAREAN SECTION     4  . CHOLECYSTECTOMY    . HERNIA REPAIR    . TONSILLECTOMY       OB History   None      Home Medications    Prior to Admission medications   Medication Sig Start Date End Date Taking? Authorizing Provider  insulin glargine (LANTUS) 100 UNIT/ML injection Inject 0.45 mLs (45 Units total) into the skin daily as needed (for high blood sugar). 10/10/16  Yes Long, Arlyss Repress, MD  lisinopril (PRINIVIL,ZESTRIL) 10 MG tablet Take 10 mg by mouth daily.   Yes [provider]  metFORMIN (GLUCOPHAGE) 500 MG tablet Take 1 tablet (500 mg total) by mouth 2 (two) times daily. 10/10/16  07/21/17 Yes Long, Arlyss Repress, MD  acetaminophen-codeine 120-12 MG/5ML solution Take 10 mLs by mouth every 4 (four) hours as needed for moderate pain. 07/21/17   Thresea Doble, Cristal Deer, PA-C  albuterol (PROVENTIL HFA;VENTOLIN HFA) 108 (90 Base) MCG/ACT inhaler Inhale 1-2 puffs into the lungs every 6 (six) hours as needed for wheezing or shortness of breath. Patient not taking: Reported on 07/21/2017 09/18/16   Ward, Chase Picket, PA-C  celecoxib (CELEBREX) 100 MG capsule Take 1 capsule (100 mg total) by mouth 2 (two) times daily. Patient not taking: Reported on 07/21/2017 12/02/16   Wallis Bamberg, PA-C  clotrimazole (LOTRIMIN) 1 % cream Apply to affected area 2 times daily Patient not taking: Reported on 07/21/2017 06/27/17   Elpidio Anis, PA-C  Guaifenesin 1200 MG TB12 Take 1 tablet (1,200 mg total) by mouth 2 (two) times daily. 07/21/17   Lelend Heinecke, Cristal Deer, PA-C  ibuprofen (ADVIL,MOTRIN) 600 MG tablet Take 1 tablet (600 mg total) by mouth every 6 (six) hours as needed. Patient not taking: Reported on 07/21/2017 06/27/17   Elpidio Anis, PA-C  metroNIDAZOLE (FLAGYL) 500 MG tablet Take 1 tablet (500 mg total) by mouth 2 (two) times daily with a meal. DO NOT CONSUME ALCOHOL WHILE TAKING THIS MEDICATION. Patient not taking: Reported on 07/21/2017 06/02/17   Jacinto Halim, PA-C  naproxen (NAPROSYN) 500 MG tablet Take 1 tablet (500 mg total) by mouth 2 (two) times daily. Patient not taking: Reported on 07/21/2017 06/02/17  Maczis, Elmer SowMichael M, PA-C  nystatin (MYCOSTATIN/NYSTOP) powder Apply to affected area three times daily until resolution of yeast infection. Patient not taking: Reported on 07/21/2017 12/16/16   Street, [employer] BarbaraMercedes, PA-C  nystatin cream (MYCOSTATIN) Apply 1 application topically 2 (two) times daily. Patient not taking: Reported on 07/21/2017 10/10/16   Long, Arlyss RepressJoshua G, MD  predniSONE (DELTASONE) 50 MG tablet Take 1 tablet (50 mg total) by mouth daily. 07/21/17   Charlestine NightLawyer, Lavetta Geier, PA-C    Family  History No family history on file.  Social History Social History   Tobacco Use  . Smoking status: Never Smoker  . Smokeless tobacco: Never Used  Substance Use Topics  . Alcohol use: No  . Drug use: No     Allergies   Latex; Morphine and related; Percocet [oxycodone-acetaminophen]; and Tape   Review of Systems Review of Systems  All other systems negative except as documented in the HPI. All pertinent positives and negatives as reviewed in the HPI.  Physical Exam Updated Vital Signs BP 124/73 (BP Location: Left Arm)   Pulse (!) 118   Temp 97.6 F (36.4 C) (Oral)   Resp 20   LMP 06/28/2017 (Approximate)   SpO2 99%   Physical Exam  Constitutional: She is oriented to person, place, and time. She appears well-developed and well-nourished. No distress.  HENT:  Head: Normocephalic and atraumatic.  Mouth/Throat: Oropharynx is clear and moist.  Eyes: Pupils are equal, round, and reactive to light.  Neck: Normal range of motion. Neck supple.  Cardiovascular: Normal rate, regular rhythm and normal heart sounds. Exam reveals no gallop and no friction rub.  No murmur heard. Pulmonary/Chest: Effort normal. No respiratory distress. She has wheezes in the right upper field, the right middle field, the right lower field, the left upper field, the left middle field and the left lower field.  Abdominal: Soft. Bowel sounds are normal. She exhibits no distension. There is no tenderness.  Neurological: She is alert and oriented to person, place, and time. She exhibits normal muscle tone. Coordination normal.  Skin: Skin is warm and dry. Capillary refill takes less than 2 seconds. No rash noted. No erythema.  Psychiatric: She has a normal mood and affect. Her behavior is normal.  Nursing note and vitals reviewed.    ED Treatments / Results  Labs (all labs ordered are listed, but only abnormal results are displayed) Labs Reviewed  BASIC METABOLIC PANEL - Abnormal; Notable for the  following components:      Result Value   Sodium 133 (*)    Chloride 100 (*)    Glucose, Bld 366 (*)    All other components within normal limits  CBC WITH DIFFERENTIAL/PLATELET - Abnormal; Notable for the following components:   Platelets 406 (*)    Neutro Abs 8.7 (*)    All other components within normal limits    EKG None  Radiology Dg Chest 2 View  Result Date: 07/21/2017 CLINICAL DATA:  Initial evaluation for acute shortness of breath, cough. EXAM: CHEST - 2 VIEW COMPARISON:  Prior radiograph from 04/06/2017. FINDINGS: Cardiac and mediastinal silhouettes are stable in size and contour, and remain within normal limits. Lungs mildly hypoinflated. Mild scattered peribronchial thickening, which could reflect sequelae of acute bronchiolitis given history of cough and congestion. No consolidative opacity to suggest pneumonia. No pulmonary edema or pleural effusion. No pneumothorax. No acute osseous abnormality. IMPRESSION: Mild scattered peribronchial thickening, suggesting possible bronchiolitis given the history of cough and congestion. No focal infiltrates to suggest pneumonia.  Electronically Signed   By: Rise Mu M.D.   On: 07/21/2017 20:55    Procedures Procedures (including critical care time)  Medications Ordered in ED Medications  albuterol (PROVENTIL,VENTOLIN) solution continuous neb (0 mg/hr Nebulization Stopped 07/21/17 2319)  albuterol (PROVENTIL HFA;VENTOLIN HFA) 108 (90 Base) MCG/ACT inhaler 2 puff (has no administration in time range)  AEROCHAMBER PLUS FLO-VU MEDIUM MISC 1 each (1 each Other Given 07/21/17 2342)     Initial Impression / Assessment and Plan / ED Course  I have reviewed the triage vital signs and the nursing notes.  Pertinent labs & imaging results that were available during my care of the patient were reviewed by me and considered in my medical decision making (see chart for details).    Has been stable here in the emergency department.   Patient ambulated without difficulty.  The patient will be treated for bronchitis and advised to follow-up with her doctor or urgent care.  Patient's pulse rate was elevated due to the hour-long neb treatment.  She did improve dramatically following this treatment.  Final Clinical Impressions(s) / ED Diagnoses   Final diagnoses:  Bronchitis    ED Discharge Orders        Ordered    Guaifenesin 1200 MG TB12  2 times daily     07/21/17 2318    predniSONE (DELTASONE) 50 MG tablet  Daily     07/21/17 2318    acetaminophen-codeine 120-12 MG/5ML solution  Every 4 hours PRN     07/21/17 2318       Charlestine Night, PA-C 07/21/17 2359    Charlynne Pander, MD 07/22/17 [phone]

## 2017-08-17 ENCOUNTER — Other Ambulatory Visit: Payer: Self-pay

## 2017-08-17 ENCOUNTER — Emergency Department (HOSPITAL_COMMUNITY)
Admission: EM | Admit: 2017-08-17 | Discharge: 2017-08-17 | Disposition: A | Discharge status: Home / Self Care | Payer: Medicare Other | Attending: Emergency Medicine | Admitting: Emergency Medicine

## 2017-08-17 ENCOUNTER — Emergency Department (HOSPITAL_COMMUNITY): Payer: Medicare Other

## 2017-08-17 ENCOUNTER — Encounter (HOSPITAL_COMMUNITY): Payer: Self-pay | Admitting: Emergency Medicine

## 2017-08-17 DIAGNOSIS — R739 Hyperglycemia, unspecified: Secondary | ICD-10-CM

## 2017-08-17 DIAGNOSIS — Z79899 Other long term (current) drug therapy: Secondary | ICD-10-CM | POA: Insufficient documentation

## 2017-08-17 DIAGNOSIS — Z794 Long term (current) use of insulin: Secondary | ICD-10-CM | POA: Insufficient documentation

## 2017-08-17 DIAGNOSIS — J45909 Unspecified asthma, uncomplicated: Secondary | ICD-10-CM | POA: Insufficient documentation

## 2017-08-17 DIAGNOSIS — K7689 Other specified diseases of liver: Secondary | ICD-10-CM | POA: Diagnosis not present

## 2017-08-17 DIAGNOSIS — Z91040 Latex allergy status: Secondary | ICD-10-CM | POA: Insufficient documentation

## 2017-08-17 DIAGNOSIS — R1030 Lower abdominal pain, unspecified: Secondary | ICD-10-CM | POA: Diagnosis present

## 2017-08-17 DIAGNOSIS — E1165 Type 2 diabetes mellitus with hyperglycemia: Secondary | ICD-10-CM | POA: Insufficient documentation

## 2017-08-17 LAB — URINALYSIS, ROUTINE W REFLEX MICROSCOPIC
Bilirubin Urine: NEGATIVE
KETONES UR: 5 mg/dL — AB
Leukocytes, UA: NEGATIVE
Nitrite: NEGATIVE
PH: 6 (ref 5.0–8.0)
Protein, ur: NEGATIVE mg/dL
Specific Gravity, Urine: 1.029 (ref 1.005–1.030)

## 2017-08-17 LAB — COMPREHENSIVE METABOLIC PANEL
ALK PHOS: 80 U/L (ref 38–126)
ALT: 13 U/L — ABNORMAL LOW (ref 14–54)
AST: 23 U/L (ref 15–41)
Albumin: 3.2 g/dL — ABNORMAL LOW (ref 3.5–5.0)
Anion gap: 13 (ref 5–15)
BILIRUBIN TOTAL: 0.7 mg/dL (ref 0.3–1.2)
BUN: 12 mg/dL (ref 6–20)
CALCIUM: 8.7 mg/dL — AB (ref 8.9–10.3)
CO2: 17 mmol/L — AB (ref 22–32)
CREATININE: 0.77 mg/dL (ref 0.44–1.00)
Chloride: 100 mmol/L — ABNORMAL LOW (ref 101–111)
Glucose, Bld: 517 mg/dL (ref 65–99)
Potassium: 4.1 mmol/L (ref 3.5–5.1)
SODIUM: 130 mmol/L — AB (ref 135–145)
TOTAL PROTEIN: 6.3 g/dL — AB (ref 6.5–8.1)

## 2017-08-17 LAB — I-STAT BETA HCG BLOOD, ED (MC, WL, AP ONLY): I-stat hCG, quantitative: <5 m[IU]/mL (ref <5)

## 2017-08-17 LAB — CBC
HCT: 36.2 % (ref 36.0–46.0)
Hemoglobin: 12.5 g/dL (ref 12.0–15.0)
MCH: 26.9 pg (ref 26.0–34.0)
MCHC: 34.5 g/dL (ref 30.0–36.0)
MCV: 78 fL (ref 78.0–100.0)
PLATELETS: 352 10*3/uL (ref 150–400)
RBC: 4.64 MIL/uL (ref 3.87–5.11)
RDW: 14.3 % (ref 11.5–15.5)
WBC: 7.8 10*3/uL (ref 4.0–10.5)

## 2017-08-17 LAB — CBG MONITORING, ED
GLUCOSE-CAPILLARY: 444 mg/dL — AB (ref 65–99)
GLUCOSE-CAPILLARY: 284 mg/dL — AB (ref 65–99)
Glucose-Capillary: 521 mg/dL (ref 65–99)

## 2017-08-17 LAB — LIPASE, BLOOD: Lipase: 23 U/L (ref 11–51)

## 2017-08-17 MED ORDER — IBUPROFEN 400 MG PO TABS
400.0000 mg | ORAL_TABLET | Freq: Once | ORAL | Status: AC | PRN
  Administered 2017-08-17: 400 mg via ORAL
  Filled 2017-08-17: qty 1

## 2017-08-17 MED ORDER — SODIUM CHLORIDE 0.9 % IV BOLUS
1000.0000 mL | Freq: Once | INTRAVENOUS | Status: AC
  Administered 2017-08-17: 1000 mL via INTRAVENOUS

## 2017-08-17 MED ORDER — IOPAMIDOL (ISOVUE-300) INJECTION 61%
100.0000 mL | Freq: Once | INTRAVENOUS | Status: AC | PRN
  Administered 2017-08-17: 100 mL via INTRAVENOUS

## 2017-08-17 MED ORDER — IOPAMIDOL (ISOVUE-300) INJECTION 61%
INTRAVENOUS | Status: AC
  Filled 2017-08-17: qty 100

## 2017-08-17 MED ORDER — ONDANSETRON HCL 4 MG/2ML IJ SOLN
4.0000 mg | Freq: Once | INTRAMUSCULAR | Status: AC
  Administered 2017-08-17: 4 mg via INTRAVENOUS
  Filled 2017-08-17: qty 2

## 2017-08-17 MED ORDER — HYDROMORPHONE HCL 2 MG/ML IJ SOLN
0.5000 mg | Freq: Once | INTRAMUSCULAR | Status: AC
  Administered 2017-08-17: 0.5 mg via INTRAVENOUS
  Filled 2017-08-17: qty 1

## 2017-08-17 NOTE — ED Provider Notes (Signed)
[facility]   CSN: [id number] Arrival date & time: 08/17/17  1200     History   Chief Complaint Chief Complaint  Patient presents with  . Abdominal Pain    HPI Evelyn Watts is a 42 y.o. female.  Patient with history of tubal ligation, cesarean section, incisional hernia repairs, cholecystectomy, diabetes --presents the emergency department with complaint of lower abdominal pain at the site of her previous C-section incisions.  Pain started last night.  It is sharp and nonradiating.  She has nausea but no vomiting.  Last bowel movement was 2 days ago and was normal.  No urinary symptoms.  Symptoms not improved with Tylenol and ibuprofen.  The onset of this condition was acute. The course is constant. Aggravating factors: none. Alleviating factors: none.       Past Medical History:  Diagnosis Date  . Asthma   . Diabetes mellitus without complication (HCC)     There are no active problems to display for this patient.   Past Surgical History:  Procedure Laterality Date  . CESAREAN SECTION     4  . CHOLECYSTECTOMY    . HERNIA REPAIR    . TONSILLECTOMY       OB History   None      Home Medications    Prior to Admission medications   Medication Sig Start Date End Date Taking? Authorizing Provider  acetaminophen-codeine 120-12 MG/5ML solution Take 10 mLs by mouth every 4 (four) hours as needed for moderate pain. 07/21/17   Lawyer, Cristal Deerhristopher, PA-C  albuterol (PROVENTIL HFA;VENTOLIN HFA) 108 (90 Base) MCG/ACT inhaler Inhale 1-2 puffs into the lungs every 6 (six) hours as needed for wheezing or shortness of breath. Patient not taking: Reported on 07/21/2017 09/18/16   Ward, Chase PicketJaime Pilcher, PA-C  celecoxib (CELEBREX) 100 MG capsule Take 1 capsule (100 mg total) by mouth 2 (two) times daily. Patient not taking: Reported on 07/21/2017 12/02/16   Wallis BambergMani, Mario, PA-C  clotrimazole (LOTRIMIN) 1 % cream Apply to affected area 2  times daily Patient not taking: Reported on 07/21/2017 06/27/17   Elpidio AnisUpstill, Shari, PA-C  Guaifenesin 1200 MG TB12 Take 1 tablet (1,200 mg total) by mouth 2 (two) times daily. 07/21/17   Lawyer, Cristal Deerhristopher, PA-C  ibuprofen (ADVIL,MOTRIN) 600 MG tablet Take 1 tablet (600 mg total) by mouth every 6 (six) hours as needed. Patient not taking: Reported on 07/21/2017 06/27/17   Elpidio AnisUpstill, Shari, PA-C  insulin glargine (LANTUS) 100 UNIT/ML injection Inject 0.45 mLs (45 Units total) into the skin daily as needed (for high blood sugar). 10/10/16   Long, Arlyss RepressJoshua G, MD  lisinopril (PRINIVIL,ZESTRIL) 10 MG tablet Take 10 mg by mouth daily.    [provider]  metFORMIN (GLUCOPHAGE) 500 MG tablet Take 1 tablet (500 mg total) by mouth 2 (two) times daily. 10/10/16 07/21/17  Long, Arlyss RepressJoshua G, MD  metroNIDAZOLE (FLAGYL) 500 MG tablet Take 1 tablet (500 mg total) by mouth 2 (two) times daily with a meal. DO NOT CONSUME ALCOHOL WHILE TAKING THIS MEDICATION. Patient not taking: Reported on 07/21/2017 06/02/17   Jacinto HalimMaczis, Michael M, PA-C  naproxen (NAPROSYN) 500 MG tablet Take 1 tablet (500 mg total) by mouth 2 (two) times daily. Patient not taking: Reported on 07/21/2017 06/02/17   Jacinto HalimMaczis, Michael M, PA-C  nystatin (MYCOSTATIN/NYSTOP) powder Apply to affected area three times daily until resolution of yeast infection. Patient not taking: Reported on 07/21/2017 12/16/16   Street, [place]cedes, PA-C  nystatin cream (MYCOSTATIN)  Apply 1 application topically 2 (two) times daily. Patient not taking: Reported on 07/21/2017 10/10/16   Long, Arlyss Repress, MD  predniSONE (DELTASONE) 50 MG tablet Take 1 tablet (50 mg total) by mouth daily. 07/21/17   Charlestine Night, PA-C    Family History No family history on file.  Social History Social History   Tobacco Use  . Smoking status: Never Smoker  . Smokeless tobacco: Never Used  Substance Use Topics  . Alcohol use: No  . Drug use: No     Allergies   Latex; Morphine and related; Percocet  [oxycodone-acetaminophen]; and Tape   Review of Systems Review of Systems  Constitutional: Negative for fever.  HENT: Negative for rhinorrhea and sore throat.   Eyes: Negative for redness.  Respiratory: Negative for cough.   Cardiovascular: Negative for chest pain.  Gastrointestinal: Positive for abdominal pain and nausea. Negative for diarrhea and vomiting.  Genitourinary: Negative for dysuria.  Musculoskeletal: Negative for myalgias.  Skin: Negative for rash.  Neurological: Negative for headaches.     Physical Exam Updated Vital Signs BP 108/64 (BP Location: Right Wrist)   Pulse 78   Temp 97.8 F (36.6 C) (Oral)   Resp 16   LMP 08/10/2017   SpO2 100%   Physical Exam  Constitutional: She appears well-developed and well-nourished.  HENT:  Head: Normocephalic and atraumatic.  Eyes: Conjunctivae are normal. Right eye exhibits no discharge. Left eye exhibits no discharge.  Neck: Normal range of motion. Neck supple.  Cardiovascular: Normal rate, regular rhythm and normal heart sounds.  Pulmonary/Chest: Effort normal and breath sounds normal.  Abdominal: Soft. She exhibits no mass. There is tenderness. There is no rebound and no guarding.  Patient with postsurgical changes over the lower abdomen.  No focal areas of swelling.  Patient generally tender over the mid lower abdomen without rebound or guarding.  Neurological: She is alert.  Skin: Skin is warm and dry.  Psychiatric: She has a normal mood and affect.  Nursing note and vitals reviewed.    ED Treatments / Results  Labs (all labs ordered are listed, but only abnormal results are displayed) Labs Reviewed  COMPREHENSIVE METABOLIC PANEL - Abnormal; Notable for the following components:      Result Value   Sodium 130 (*)    Chloride 100 (*)    CO2 17 (*)    Glucose, Bld 517 (*)    Calcium 8.7 (*)    Total Protein 6.3 (*)    Albumin 3.2 (*)    ALT 13 (*)    All other components within normal limits  URINALYSIS,  ROUTINE W REFLEX MICROSCOPIC - Abnormal; Notable for the following components:   Color, Urine STRAW (*)    Glucose, UA >=500 (*)    Hgb urine dipstick SMALL (*)    Ketones, ur 5 (*)    Bacteria, UA RARE (*)    All other components within normal limits  CBG MONITORING, ED - Abnormal; Notable for the following components:   Glucose-Capillary 521 (*)    All other components within normal limits  CBG MONITORING, ED - Abnormal; Notable for the following components:   Glucose-Capillary 444 (*)    All other components within normal limits  LIPASE, BLOOD  CBC  I-STAT BETA HCG BLOOD, ED ([facility], [facility], [facility] ONLY)    EKG None  Radiology Ct Abdomen Pelvis W Contrast  Result Date: 08/17/2017 CLINICAL DATA:  Lower abdominal pain.  Multiple hernias. EXAM: CT ABDOMEN AND PELVIS WITH CONTRAST TECHNIQUE: Multidetector  CT imaging of the abdomen and pelvis was performed using the standard protocol following bolus administration of intravenous contrast. CONTRAST:  ISOVUE-300 IOPAMIDOL (ISOVUE-300) INJECTION 61% COMPARISON:  10/04/2016 FINDINGS: Lower chest: There is atelectasis at the lung bases. The included heart is normal in size. Stable pleural-based nodular density in the right middle lobe is unchanged, partially included measuring approximately 4 mm. Hepatobiliary: Cholecystectomy. No biliary dilatation. Predominantly hypodense masslike abnormality in the medial segment of the left hepatic lobe measuring 6.5 x 4.2 x 3.2 cm is identified, slightly bulging the capsule. MRI without and with IV contrast is suggested for better characterization. Possibility might include FNH or adenoma among some possibilities though not exclusive. This is more conspicuous on current exam. Pancreas: Unremarkable. No pancreatic ductal dilatation or surrounding inflammatory changes. Spleen: Normal in size without focal abnormality. Adrenals/Urinary Tract: Normal bilateral adrenal glands. Symmetric enhancement of both kidneys. No  obstructive uropathy. Urinary bladder is free of stones. No focal mural thickening is noted. Stomach/Bowel: Decompressed stomach. Redemonstration of left lower quadrant ventral abdominal wall hernia just lateral to pre-existing ventral hernia mesh repair. There is herniation of omental fat, small and large bowel loops, similar in appearance to prior without incarceration. No acute bowel inflammation.  Normal appendix. Vascular/Lymphatic: No significant vascular findings are present. No enlarged abdominal or pelvic lymph nodes. Reproductive: Uterus and bilateral adnexa are unremarkable. Other: No free air nor free fluid. Musculoskeletal: No acute or significant osseous findings. IMPRESSION: 1. Indeterminate subcapsular left hepatic lobe mass measuring 6.5 x 4.2 x 3.2 cm demonstrating mild vascularity, more conspicuous than on prior unenhanced studies. Differential possibilities might include patent adenoma given patient age and demographics or potentially focal nodular hyperplasia among some possibilities though not exclusive. Benign and malignant possibilities still remain within the differential. MRI with and without IV contrast on a nonemergent basis is recommended. 2. Redemonstration of ventral left lower quadrant hernia adjacent to pre-existing mesh repair containing omental fat, small and large bowel loops without incarceration or bowel obstruction. Electronically Signed   By: Tollie Eth M.D.   On: 08/17/2017 21:08    Procedures Procedures (including critical care time)  Medications Ordered in ED Medications  iopamidol (ISOVUE-300) 61 % injection (has no administration in time range)  ibuprofen (ADVIL,MOTRIN) tablet 400 mg (400 mg Oral Given 08/17/17 1608)  sodium chloride 0.9 % bolus 1,000 mL (0 mLs Intravenous Stopped 08/17/17 2147)  HYDROmorphone (DILAUDID) injection 0.5 mg (0.5 mg Intravenous Given 08/17/17 1939)  ondansetron (ZOFRAN) injection 4 mg (4 mg Intravenous Given 08/17/17 1939)    iopamidol (ISOVUE-300) 61 % injection 100 mL (100 mLs Intravenous Contrast Given 08/17/17 2028)     Initial Impression / Assessment and Plan / ED Course  I have reviewed the triage vital signs and the nursing notes.  Pertinent labs & imaging results that were available during my care of the patient were reviewed by me and considered in my medical decision making (see chart for details).     Patient seen and examined. Work-up reviewed. Medications ordered.  Blood sugar in the mid 400s at last check.  Will give fluid bolus.  CT abdomen pelvis ordered.  Patient will need primary care referrals for home.  Vital signs reviewed and are as follows: BP 108/64 (BP Location: Right Wrist)   Pulse 78   Temp 97.8 F (36.6 C) (Oral)   Resp 16   LMP 08/10/2017   SpO2 100%   9:59 PM CT findings as above.  Patient informed of the liver findings  and need to follow-up with either her PCP or GI referral for this.  Blood sugar improved after IV fluids.  Abdominal exam is stable.  We will discharged home at this time.  The patient was urged to return to the Emergency Department immediately with worsening of current symptoms, worsening abdominal pain, persistent vomiting, blood noted in stools, fever, or any other concerns. The patient verbalized understanding.    Final Clinical Impressions(s) / ED Diagnoses   Final diagnoses:  Lower abdominal pain  Hepatic cyst  Hyperglycemia   Lower abdominal pain: Patient with abdominal pain. Vitals are stable, no fever. Labs reassuring without leukocytosis. Imaging without signs of abdominal infection or incarcerated hernia. No signs of dehydration, patient is tolerating PO's.  Patient has growth on liver which is likely incidental. Lungs are clear and no signs suggestive of PNA. Low concern for appendicitis, cholecystitis, pancreatitis, ruptured viscus, UTI, kidney stone, aortic dissection, aortic aneurysm or other emergent abdominal etiology. Supportive therapy  indicated with return if symptoms worsen.    Hyperglycemia: No signs of DKA.  Treated with IV fluids.  Patient to hold metformin given IV contrast load.    ED Discharge Orders    None       Renne Crigler, Cordelia Poche 08/17/17 2201    Linwood Dibbles, MD 08/18/17 1430

## 2017-08-17 NOTE — ED Notes (Signed)
Pt pulled back to triage to be medicated.

## 2017-08-17 NOTE — ED Notes (Signed)
ED Provider at bedside. 

## 2017-08-17 NOTE — Discharge Instructions (Signed)
Please read and follow all provided instructions.  Your diagnoses today include:  1. Lower abdominal pain   2. Hepatic cyst   3. Hyperglycemia     Tests performed today include: Blood counts and electrolytes Blood tests to check liver and kidney function - shows high blood sugar, improved Blood tests to check pancreas function Urine test to look for infection CT scan - shows stable hernia, also a liver cyst which will need to be checked by your doctor or the gastroenterologist listed Vital signs. See below for your results today.   Medications prescribed:  None  Take any prescribed medications only as directed.  Home care instructions:  Follow any educational materials contained in this packet.  Follow-up instructions: Please follow-up with your primary care provider in the next 7 days for further evaluation of your symptoms.    Return instructions:  SEEK IMMEDIATE MEDICAL ATTENTION IF: The pain does not go away or becomes severe  A temperature above 101F develops  Repeated vomiting occurs (multiple episodes)  The pain becomes localized to portions of the abdomen. The right side could possibly be appendicitis. In an adult, the left lower portion of the abdomen could be colitis or diverticulitis.  Blood is being passed in stools or vomit (bright red or black tarry stools)  You develop chest pain, difficulty breathing, dizziness or fainting, or become confused, poorly responsive, or inconsolable (young children) If you have any other emergent concerns regarding your health  Additional Information: Abdominal (belly) pain can be caused by many things. Your caregiver performed an examination and possibly ordered blood/urine tests and imaging (CT scan, x-rays, ultrasound). Many cases can be observed and treated at home after initial evaluation in the emergency department. Even though you are being discharged home, abdominal pain can be unpredictable. Therefore, you need a repeated  exam if your pain does not resolve, returns, or worsens. Most patients with abdominal pain don't have to be admitted to the hospital or have surgery, but serious problems like appendicitis and gallbladder attacks can start out as nonspecific pain. Many abdominal conditions cannot be diagnosed in one visit, so follow-up evaluations are very important.  Your vital signs today were: BP (!) 147/88    Pulse 81    Temp 97.8 F (36.6 C) (Oral)    Resp (!) 24    LMP 08/10/2017    SpO2 100%  If your blood pressure (bp) was elevated above 135/85 this visit, please have this repeated by your doctor within one month. --------------

## 2017-08-17 NOTE — ED Notes (Signed)
ED Provider at bedside. [place]

## 2017-08-17 NOTE — ED Notes (Signed)
Patient transported to CT 

## 2017-08-17 NOTE — ED Triage Notes (Signed)
Patient complaining of abdominal pain, "feels like I just gave birth", starting at 0100 this morning. CBG with EMS >500, received NS. Reports she has not been able to take her victoza after moving because she needs her medications pre-authorized with insurance.

## 2017-08-17 NOTE — ED Notes (Signed)
Patient uncomfortable stating she is in pain and wants" something stronger like opioids". This tech offered to lay her down on the bench with warm blankets for comfort. Pt complied and seems content at this time.

## 2017-08-24 ENCOUNTER — Ambulatory Visit: Payer: Medicare Other | Attending: Family Medicine | Admitting: Physician Assistant

## 2017-08-24 VITALS — BP 119/81 | HR 95 | Temp 98.4°F | Resp 18 | Ht 71.0 in | Wt 295.6 lb

## 2017-08-24 DIAGNOSIS — Z79899 Other long term (current) drug therapy: Secondary | ICD-10-CM | POA: Insufficient documentation

## 2017-08-24 DIAGNOSIS — B373 Candidiasis of vulva and vagina: Secondary | ICD-10-CM | POA: Insufficient documentation

## 2017-08-24 DIAGNOSIS — Z9049 Acquired absence of other specified parts of digestive tract: Secondary | ICD-10-CM | POA: Insufficient documentation

## 2017-08-24 DIAGNOSIS — Z885 Allergy status to narcotic agent status: Secondary | ICD-10-CM | POA: Insufficient documentation

## 2017-08-24 DIAGNOSIS — B3731 Acute candidiasis of vulva and vagina: Secondary | ICD-10-CM

## 2017-08-24 DIAGNOSIS — Z91040 Latex allergy status: Secondary | ICD-10-CM | POA: Diagnosis not present

## 2017-08-24 DIAGNOSIS — Z9889 Other specified postprocedural states: Secondary | ICD-10-CM | POA: Diagnosis not present

## 2017-08-24 DIAGNOSIS — Z888 Allergy status to other drugs, medicaments and biological substances status: Secondary | ICD-10-CM | POA: Diagnosis not present

## 2017-08-24 DIAGNOSIS — E119 Type 2 diabetes mellitus without complications: Secondary | ICD-10-CM | POA: Insufficient documentation

## 2017-08-24 DIAGNOSIS — R935 Abnormal findings on diagnostic imaging of other abdominal regions, including retroperitoneum: Secondary | ICD-10-CM

## 2017-08-24 DIAGNOSIS — R110 Nausea: Secondary | ICD-10-CM | POA: Diagnosis not present

## 2017-08-24 DIAGNOSIS — J45909 Unspecified asthma, uncomplicated: Secondary | ICD-10-CM | POA: Insufficient documentation

## 2017-08-24 DIAGNOSIS — Z794 Long term (current) use of insulin: Secondary | ICD-10-CM | POA: Insufficient documentation

## 2017-08-24 LAB — GLUCOSE, POCT (MANUAL RESULT ENTRY)
POC Glucose: 324 mg/dl — AB (ref 70–99)
POC Glucose: 308 mg/dl — AB (ref 70–99)

## 2017-08-24 LAB — POCT GLYCOSYLATED HEMOGLOBIN (HGB A1C): HEMOGLOBIN A1C: 12.6

## 2017-08-24 MED ORDER — LIRAGLUTIDE 18 MG/3ML ~~LOC~~ SOPN: 1.2000 mg | PEN_INJECTOR | Freq: Every day | SUBCUTANEOUS | 3 refills | Status: DC

## 2017-08-24 MED ORDER — INSULIN ASPART 100 UNIT/ML ~~LOC~~ SOLN
10.0000 [IU] | Freq: Once | SUBCUTANEOUS | Status: AC
Start: 2017-08-24 — End: 2017-08-24
  Administered 2017-08-24: 10 [IU] via SUBCUTANEOUS

## 2017-08-24 MED ORDER — INSULIN GLARGINE 100 UNIT/ML ~~LOC~~ SOLN: 50.0000 [IU] | Freq: Every day | SUBCUTANEOUS | 3 refills | Status: DC

## 2017-08-24 MED ORDER — TRUEPLUS LANCETS 28G MISC: 28.0000 g | Freq: Four times a day (QID) | 1 refills | Status: DC

## 2017-08-24 MED ORDER — FLUCONAZOLE 150 MG PO TABS: 150.0000 mg | ORAL_TABLET | Freq: Once | ORAL | 1 refills | Status: AC

## 2017-08-24 MED ORDER — BLOOD GLUCOSE MONITOR KIT: PACK | 0 refills | Status: DC

## 2017-08-24 MED ORDER — GLUCOSE BLOOD VI STRP: ORAL_STRIP | 12 refills | Status: DC

## 2017-08-24 MED ORDER — BLOOD GLUCOSE MONITOR KIT
PACK | 0 refills | Status: DC
Start: 2017-08-24 — End: 2017-08-24

## 2017-08-24 MED ORDER — TRUE METRIX METER DEVI: 1.0000 | Freq: Four times a day (QID) | 0 refills | Status: DC

## 2017-08-24 NOTE — Patient Instructions (Signed)
Aim for 30 minutes of exercise most days. Rethink what you drink. Water is great! Aim for 2-3 Carb Choices per meal (30-45 grams) +/- 1 either way  Aim for 0-15 Carbs per snack if hungry  Include protein in moderation with your meals and snacks  Consider reading food labels for Total Carbohydrate and Fat Grams of foods  Consider checking BG at alternate times per day  Continue taking medication as directed Be mindful about how much sugar you are adding to beverages and other foods. Fruit Punch - find one with no sugar  Measure and decrease portions of carbohydrate foods  Make your plate and don't go back for seconds   Pick up meter today  Inc Lantus to 50 Units nightly until we can get the Victoza

## 2017-08-24 NOTE — Progress Notes (Signed)
Evelyn Watts  QIW:[id number]  ERD:[id number]  DOB - 05-25-1975  Chief Complaint  Patient presents with  . Hospitalization Follow-up  . Diabetes  . Vaginal Discharge       Subjective:   Evelyn Watts is a 42 y.o. female here today for establishment of care. She has a past medical history of asthma and diabetes mellitus type 2, insulin requiring. She is new to [place] approximately one year ago. She has not been seen by primary care provider since then. She has had multiple emergency department visits over the last year.  The most recent one was on 08/17/2017. At that time she presented for a lower abdominal pain. She described a sharp, nonradiating pain with nausea but no vomiting. No fevers. Unrelieved with over-the-counter regimen. She was noted to have a glucose of greater than 500. Her sodium was 130. There was minimal ketones in her urine. She was given IV fluids anti-emetics and pain medications. Her vital signs remained stable. They did do a CT scan of her abdomen and pelvis. There was an incidental finding of a liver mass in the medial segment of the left hepatic lobe measuring 6.5 x 4.2 x 3.2 cm.  In regards to her diabetes, she does not have a blood sugar meter Previously was taking Lantus 45 units along with Victoza 1.2 mg daily and metformin 500 mg twice daily. This regimen had her sugar under control but she has been off of the Victoza for at least 1 year. She states that she was never educated on what she should and should not eat.  Occasionally she makes adjustments to her insulin regimen according to how she feels. Abd pain has subsided.  She also complains of a white thick discharge from the vaginal area. She states that she frequently gets yeast infections. She is itching vaginally. Positive odor. Declines a vaginal exam. States that she knows what it is.  ROS: GEN: denies fever or chills, denies change in weight Skin: denies lesions or rashes HEENT: denies  headache, earache, epistaxis, sore throat, or neck pain LUNGS: denies SHOB, dyspnea, PND, orthopnea CV: denies CP or palpitations ABD: denies abd pain, N or V EXT: denies muscle spasms or swelling; no pain in lower ext, no weakness NEURO: denies numbness or tingling, denies sz, stroke or TIA GU- +discharge  ALLERGIES: Allergies  Allergen Reactions  . Latex Rash  . Morphine And Related Rash  . Percocet [Oxycodone-Acetaminophen] Rash  . Tape Rash    PAST MEDICAL HISTORY: Past Medical History:  Diagnosis Date  . Asthma   . Diabetes mellitus without complication ([place])     PAST SURGICAL HISTORY: Past Surgical History:  Procedure Laterality Date  . CESAREAN SECTION     4  . CHOLECYSTECTOMY    . HERNIA REPAIR    . TONSILLECTOMY      MEDICATIONS AT HOME: Prior to Admission medications   Medication Sig Start Date End Date Taking? Authorizing Provider  albuterol (PROVENTIL HFA;VENTOLIN HFA) 108 (90 Base) MCG/ACT inhaler Inhale 1-2 puffs into the lungs every 6 (six) hours as needed for wheezing or shortness of breath. 09/18/16  Yes Ward, Ozella Almond, PA-C  insulin glargine (LANTUS) 100 UNIT/ML injection Inject 0.5 mLs (50 Units total) into the skin at bedtime. 08/24/17  Yes Ena Dawley, Caroljean Monsivais S, PA-C  lisinopril (PRINIVIL,ZESTRIL) 20 MG tablet Take 20 mg by mouth daily.    Yes [provider]  metFORMIN (GLUCOPHAGE) 500 MG tablet Take 1 tablet (500 mg total) by mouth 2 (  two) times daily. 10/10/16 08/24/17 Yes Long, Wonda Olds, MD  Blood Glucose Monitoring Suppl (TRUE METRIX METER) DEVI 1 kit by Does not apply route 4 (four) times daily. 08/24/17   Brayton Caves, PA-C  Dulaglutide (TRULICITY [place]) Inject into the skin.    [provider]  fluconazole (DIFLUCAN) 150 MG tablet Take 1 tablet (150 mg total) by mouth once for 1 dose. 1 tab now then repeat dose in 3 days 08/24/17 08/24/17  Brayton Caves, PA-C  glucose blood (TRUE METRIX BLOOD GLUCOSE TEST) test strip Use as instructed  08/24/17   Brayton Caves, PA-C  liraglutide (VICTOZA) 18 MG/3ML SOPN Inject 0.2 mLs (1.2 mg total) into the skin daily. 08/24/17   Brayton Caves, PA-C  TRUEPLUS LANCETS 28G MISC 28 g by Does not apply route 4 (four) times daily. 08/24/17   Brayton Caves, PA-C   Family-noncontributiry  Social-married, recently moved to area from [place], nonsmoker  Objective:   Vitals:   08/24/17 1424  BP: 119/81  Pulse: 95  Resp: 18  Temp: 98.4 F (36.9 C)  SpO2: 93%  Weight: 295 lb 9.6 oz (134.1 kg)  Height: _0  (1.803 m)    Exam General appearance : Awake, alert, not in any distress. Speech Clear. Not toxic looking HEENT: Atraumatic and Normocephalic, pupils equally reactive to light and accomodation Neck: supple, no JVD. No cervical lymphadenopathy.  Chest:Good air entry bilaterally, no added sounds  CVS: S1 S2 regular, no murmurs.  Abdomen: Bowel sounds present, Non tender and not distended with no guarding, rigidity or rebound. Extremities: B/L Lower Ext shows no edema, both legs are warm to touch Neurology: Awake alert, and oriented X 3, CN II-XII intact, Non focal Skin:Rash, yeasty skin folds GU-declined  Data Review Lab Results  Component Value Date   HGBA1C 12.6 08/24/2017     Assessment & Plan  1. DM 2, insulin requiring  -diabetes education class  -meter prescribed (chjeck 3-4 X/day)  -inc Lantus 50 U nightly until we can see about  Victoza  -Cont Metformin 500 mg BID (higher dose causes GI  upset)  2. Yeast Vaginitis  -Diflucan  -better BS control   3. Abn CT A/P/liver mass  -ED gave her the number to [facility], she states  she will call today   Return in about 2 weeks (around 09/07/2017).  The patient was given clear instructions to go to ER or return to medical center if symptoms don't improve, worsen or new problems develop. The patient verbalized understanding. The patient was told to call to get lab results if they haven't heard anything in the next week.    Total time spent with patient was 49 min. Greater than 50 % of this visit was spent face to face counseling and coordinating care regarding risk factor modification, compliance importance and encouragement, education related to diabetes.  This note has been created with Surveyor, quantity. Any transcriptional errors are unintentional.    Zettie Pho, PA-C [facility] [place], [place]   08/24/2017, 2:59 PM

## 2017-08-24 NOTE — Addendum Note (Signed)
Addended byVivianne Master on: 08/24/2017 03:21 PM   Modules accepted: Orders

## 2017-08-24 NOTE — Addendum Note (Signed)
Addended byVivianne Master on: 08/24/2017 03:32 PM   Modules accepted: Orders

## 2017-08-25 LAB — BASIC METABOLIC PANEL
BUN/Creatinine Ratio: 18 (ref 9–23)
BUN: 11 mg/dL (ref 6–20)
CALCIUM: 9.5 mg/dL (ref 8.7–10.2)
CHLORIDE: 98 mmol/L (ref 96–106)
CO2: 20 mmol/L (ref 20–29)
Creatinine, Ser: 0.62 mg/dL (ref 0.57–1.00)
GFR calc Af Amer: 131 mL/min/{1.73_m2} (ref >59)
GFR calc non Af Amer: 114 mL/min/{1.73_m2} (ref >59)
GLUCOSE: 309 mg/dL — AB (ref 65–99)
Potassium: 4.4 mmol/L (ref 3.5–5.2)
SODIUM: 134 mmol/L (ref 134–144)

## 2017-09-04 ENCOUNTER — Ambulatory Visit: Payer: Medicare Other | Admitting: Dietician

## 2017-09-06 ENCOUNTER — Emergency Department (HOSPITAL_COMMUNITY): Payer: Medicare Other

## 2017-09-06 ENCOUNTER — Emergency Department (HOSPITAL_COMMUNITY)
Admission: EM | Admit: 2017-09-06 | Discharge: 2017-09-06 | Disposition: A | Discharge status: Home / Self Care | Payer: Medicare Other | Attending: Emergency Medicine | Admitting: Emergency Medicine

## 2017-09-06 ENCOUNTER — Encounter (HOSPITAL_COMMUNITY): Payer: Self-pay

## 2017-09-06 DIAGNOSIS — M545 Low back pain, unspecified: Secondary | ICD-10-CM

## 2017-09-06 DIAGNOSIS — Z91040 Latex allergy status: Secondary | ICD-10-CM | POA: Diagnosis not present

## 2017-09-06 DIAGNOSIS — Z7984 Long term (current) use of oral hypoglycemic drugs: Secondary | ICD-10-CM | POA: Insufficient documentation

## 2017-09-06 DIAGNOSIS — E119 Type 2 diabetes mellitus without complications: Secondary | ICD-10-CM | POA: Diagnosis not present

## 2017-09-06 DIAGNOSIS — Z794 Long term (current) use of insulin: Secondary | ICD-10-CM | POA: Diagnosis not present

## 2017-09-06 DIAGNOSIS — W19XXXA Unspecified fall, initial encounter: Secondary | ICD-10-CM

## 2017-09-06 DIAGNOSIS — J45909 Unspecified asthma, uncomplicated: Secondary | ICD-10-CM | POA: Diagnosis not present

## 2017-09-06 LAB — I-STAT BETA HCG BLOOD, ED (MC, WL, AP ONLY): I-stat hCG, quantitative: <5 m[IU]/mL (ref <5)

## 2017-09-06 MED ORDER — HYDROMORPHONE HCL 1 MG/ML IJ SOLN
1.0000 mg | Freq: Once | INTRAMUSCULAR | Status: AC
  Administered 2017-09-06: 1 mg via INTRAVENOUS
  Filled 2017-09-06: qty 1

## 2017-09-06 NOTE — ED Provider Notes (Signed)
[facility] DEPT Provider Note   CSN: [id number] Arrival date & time: 09/06/17  1005     History   Chief Complaint Chief Complaint  Patient presents with  . Fall    HPI Evelyn Watts is a 42 y.o. female.  The history is provided by the patient and medical records.  Back Pain   This is a new problem. The current episode started less than 1 hour ago. The problem occurs constantly. The problem has not changed since onset.The pain is associated with falling. The pain is present in the thoracic spine and lumbar spine. The quality of the pain is described as stabbing and aching. The pain does not radiate. The pain is at a severity of 10/10. The pain is severe. The symptoms are aggravated by bending, twisting and certain positions. The pain is the same all the time. Pertinent negatives include no chest pain, no fever, no numbness, no headaches, no abdominal pain, no abdominal swelling, no bowel incontinence, no perianal numbness, no bladder incontinence, no dysuria, no leg pain, no paresthesias, no paresis, no tingling and no weakness. She has tried nothing for the symptoms. The treatment provided no relief.    Past Medical History:  Diagnosis Date  . Asthma   . Diabetes mellitus without complication [facility])     Patient Active Problem List   Diagnosis Date Noted  . Diabetes mellitus without complication ([place]) 29/93/7169    Past Surgical History:  Procedure Laterality Date  . CESAREAN SECTION     4  . CHOLECYSTECTOMY    . HERNIA REPAIR    . TONSILLECTOMY       OB History   None      Home Medications    Prior to Admission medications   Medication Sig Start Date End Date Taking? Authorizing Provider  albuterol (PROVENTIL HFA;VENTOLIN HFA) 108 (90 Base) MCG/ACT inhaler Inhale 1-2 puffs into the lungs every 6 (six) hours as needed for wheezing or shortness of breath. 09/18/16   Ward, Ozella Almond, PA-C  blood glucose meter kit and supplies KIT  Dispense based on patient and insurance preference. Use up to four times daily as directed. (FOR ICD-9 250.00, 250.01). 08/24/17   Brayton Caves, PA-C  Dulaglutide (TRULICITY [place]) Inject into the skin.    [provider]  insulin glargine (LANTUS) 100 UNIT/ML injection Inject 0.5 mLs (50 Units total) into the skin at bedtime. 08/24/17   Brayton Caves, PA-C  liraglutide (VICTOZA) 18 MG/3ML SOPN Inject 0.2 mLs (1.2 mg total) into the skin daily. 08/24/17   Brayton Caves, PA-C  lisinopril (PRINIVIL,ZESTRIL) 20 MG tablet Take 20 mg by mouth daily.     [provider]  metFORMIN (GLUCOPHAGE) 500 MG tablet Take 1 tablet (500 mg total) by mouth 2 (two) times daily. 10/10/16 08/24/17  Long, Wonda Olds, MD    Family History History reviewed. No pertinent family history.  Social History Social History   Tobacco Use  . Smoking status: Never Smoker  . Smokeless tobacco: Never Used  Substance Use Topics  . Alcohol use: No  . Drug use: No     Allergies   Latex; Morphine and related; Percocet [oxycodone-acetaminophen]; and Tape   Review of Systems Review of Systems  Constitutional: Negative for chills, diaphoresis, fatigue and fever.  HENT: Negative for congestion.   Respiratory: Negative for chest tightness, shortness of breath and stridor.   Cardiovascular: Negative for chest pain and palpitations.  Gastrointestinal: Negative for abdominal pain, bowel  incontinence, constipation, diarrhea, nausea and vomiting.  Genitourinary: Negative for bladder incontinence, dysuria, flank pain and frequency.  Musculoskeletal: Positive for back pain. Negative for neck pain and neck stiffness.  Skin: Negative for rash.  Neurological: Negative for tingling, weakness, light-headedness, numbness, headaches and paresthesias.  All other systems reviewed and are negative.    Physical Exam Updated Vital Signs BP (!) 129/101 (BP Location: Left Arm)   Pulse 98   Temp 98.3 F (36.8 C) (Oral)    Resp 18   LMP 08/10/2017   SpO2 99%   Physical Exam  Constitutional: She is oriented to person, place, and time. She appears well-developed and well-nourished. No distress.  HENT:  Head: Normocephalic and atraumatic.  Mouth/Throat: Oropharynx is clear and moist.  Eyes: Pupils are equal, round, and reactive to light. Conjunctivae and EOM are normal.  Neck: Normal range of motion. Neck supple.  Cardiovascular: Normal rate, regular rhythm and intact distal pulses.  No murmur heard. Pulmonary/Chest: Effort normal and breath sounds normal. No stridor. No respiratory distress. She has no wheezes. She exhibits no tenderness.  Abdominal: Soft. There is no tenderness. There is no guarding.  Musculoskeletal: She exhibits tenderness. She exhibits no edema or deformity.       Thoracic back: She exhibits tenderness and pain.       Lumbar back: She exhibits tenderness and pain.       Back:  Lymphadenopathy:    She has no cervical adenopathy.  Neurological: She is alert and oriented to person, place, and time. She is not disoriented. No cranial nerve deficit or sensory deficit. She exhibits normal muscle tone. Coordination normal. GCS eye subscore is 4. GCS verbal subscore is 5. GCS motor subscore is 6.  No numbness, tingling, or weakness of bilateral lower extremity's.  Normal pulses in legs.  Tenderness of the back.  Skin: Skin is warm and dry. No rash noted. She is not diaphoretic. No erythema.  Psychiatric: She has a normal mood and affect.  Nursing note and vitals reviewed.    ED Treatments / Results  Labs (all labs ordered are listed, but only abnormal results are displayed) Labs Reviewed  I-STAT BETA HCG BLOOD, ED ([facility], [facility], [facility] ONLY)    EKG None  Radiology Ct Thoracic Spine Wo Contrast  Result Date: 09/06/2017 CLINICAL DATA:  The patient fell in a puddle this morning and felt a pop in her back with onset of pain. Anterior right thigh pain. Initial encounter. EXAM: CT THORACIC AND  LUMBAR SPINE WITHOUT CONTRAST TECHNIQUE: Multidetector CT imaging of the thoracic and lumbar spine was performed without contrast. Multiplanar CT image reconstructions were also generated. COMPARISON:  CT abdomen and pelvis 10/04/2016. FINDINGS: CT THORACIC SPINE FINDINGS Alignment: Maintained. Vertebrae: No fracture or focal lesion. Paraspinal and other soft tissues: Negative. Disc levels: Intervertebral disc space height is maintained. Scattered facet degenerative disease is noted. The central canal and foramina appear open. CT LUMBAR SPINE FINDINGS Segmentation: Standard. Alignment: Maintained. Vertebrae: No fracture or focal lesion. Paraspinal and other soft tissues: No acute abnormality. Fatty infiltration of the liver is noted. The patient is status post cholecystectomy. Disc levels: Intervertebral disc space height is maintained. Facet arthropathy is present and appears worst at L4-5 and L5-S1 on the right. IMPRESSION: CT THORACIC SPINE IMPRESSION Negative exam. CT LUMBAR SPINE IMPRESSION No acute abnormality. 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 Fatty infiltration of the liver. Electronically Signed   By: Inge Rise M.D.  On: 09/06/2017 12:09   Ct Lumbar Spine Wo Contrast  Result Date: 09/06/2017 CLINICAL DATA:  The patient fell in a puddle this morning and felt a pop in her back with onset of pain. Anterior right thigh pain. Initial encounter. EXAM: CT THORACIC AND LUMBAR SPINE WITHOUT CONTRAST TECHNIQUE: Multidetector CT imaging of the thoracic and lumbar spine was performed without contrast. Multiplanar CT image reconstructions were also generated. COMPARISON:  CT abdomen and pelvis 10/04/2016. FINDINGS: CT THORACIC SPINE FINDINGS Alignment: Maintained. Vertebrae: No fracture or focal lesion. Paraspinal and other soft tissues: Negative. Disc levels: Intervertebral disc space height is maintained. Scattered facet degenerative disease is  noted. The central canal and foramina appear open. CT LUMBAR SPINE FINDINGS Segmentation: Standard. Alignment: Maintained. Vertebrae: No fracture or focal lesion. Paraspinal and other soft tissues: No acute abnormality. Fatty infiltration of the liver is noted. The patient is status post cholecystectomy. Disc levels: Intervertebral disc space height is maintained. Facet arthropathy is present and appears worst at L4-5 and L5-S1 on the right. IMPRESSION: CT THORACIC SPINE IMPRESSION Negative exam. CT LUMBAR SPINE IMPRESSION No acute abnormality. 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 Fatty infiltration of the liver. Electronically Signed   By: Inge Rise M.D.   On: 09/06/2017 12:09    Procedures Procedures (including critical care time)  Medications Ordered in ED Medications  HYDROmorphone (DILAUDID) injection 1 mg (1 mg Intravenous Given 09/06/17 1059)     Initial Impression / Assessment and Plan / ED Course  I have reviewed the triage vital signs and the nursing notes.  Pertinent labs & imaging results that were available during my care of the patient were reviewed by me and considered in my medical decision making (see chart for details).     Evelyn Watts is a 42 y.o. female with a past medical history significant for diabetes and asthma as well as prior abdominal hernia who presents with back pain from a fall.  Patient reports that within the last hour, she was walking inside a store when she slipped on a puddle.  She reports that she tried to catch herself holding onto a shelf but still fell to the ground.  She denies hitting her head or neck but reports she felt a large pop in her mid and low back causing severe pain.  She reports the pain is greater than 10 out of 10 in severity.  It is isolated intermittent low back.  It does not radiate down her legs.  She has no numbness, tingling, or weakness of legs.  She denies urinary  incontinence or bowel incontinence.  She denies any headache or neck pain and did not lose consciousness.  She denies abdominal pain or chest pain.  She denies other complaints.  On exam, patient's lungs clear.  Chest nontender.  Abdomen nontender.  Legs had normal strength and sensation distally.  Normal pulses in bilateral lower extremities.  Patient had tenderness in the paraspinal and mid thoracic and lumbar areas of the back.  Neck nontender.  Based on patient's fall with a pop and pain, patient will have CT imaging of thoracic and lumbar spine to look for acute fracture dislocation or other abnormalities.  Of note, patient was found to have a mass in her liver during her last visit and is going to see GI for this.  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  Patient given pain medicine and images will be collected.  Anticipate  reassessment after work-up.  2:17 PM Patient reports her pain is improved.  CT scan showed no evidence of acute fracture or dislocation of the lumbar or thoracic spine.  Mild degenerative disc disease seen in the lumbar spine.    Do not feel patient has any traumatic injury to her cord or bones of her back.  Patient was felt stable for discharge home given her reassuring exam.    Patient will follow with PCP for further management and return precautions.  Patient discharged in good condition.    Final Clinical Impressions(s) / ED Diagnoses   Final diagnoses:  Fall, initial encounter  Acute bilateral low back pain without sciatica    ED Discharge Orders    None      Clinical Impression: 1. Fall, initial encounter   2. Acute bilateral low back pain without sciatica     Disposition: Discharge  Condition: Good  I have discussed the results, Dx and Tx plan with the pt(& family if present). He/she/they expressed understanding and agree(s) with the plan. Discharge instructions discussed at great length. Strict return precautions discussed  and pt &/or family have verbalized understanding of the instructions. No further questions at time of discharge.    New Prescriptions   No medications on file    Follow Up: [place] [id number] [phone] Follow up Either of these offices can become your doctor.  [place] [phone] Follow up Either of these offices can become your doctor.     Sahithi Ordoyne, Gwenyth Allegra, MD 09/06/17 1420

## 2017-09-06 NOTE — ED Triage Notes (Signed)
Patient requested to have c-collar on.

## 2017-09-06 NOTE — Discharge Instructions (Signed)
Your work-up today did not show evidence of acute fracture suspect soft tissue injury.  Please stay hydrated and use over-the-counter inflammatory pain medications to help with your symptoms.  Please follow-up with primary care physician for further management.  If any symptoms change or worsen, please return to the nearest emergency department.

## 2017-09-06 NOTE — ED Notes (Signed)
Patient ambulated to the bathroom with minimum assist by the nurse tech.

## 2017-09-06 NOTE — ED Triage Notes (Signed)
Patient arrived from [employer]. Patient slipped on a puddle in the store and caught herself on shelves and is c/o of lower back pain. Patient states she felt something pop in her lower back. Patient on phone on the ride over to ED. Hx.DM, Hypertension. Allergic to morphine and percocet.

## 2017-09-25 ENCOUNTER — Ambulatory Visit: Payer: Medicare Other | Admitting: Internal Medicine

## 2017-10-05 ENCOUNTER — Telehealth: Payer: Self-pay | Admitting: General Practice

## 2017-10-05 NOTE — Telephone Encounter (Signed)
Pt states she is taking medications as directed. The only thing that helps is the Victoza. She has vaginal yeast that is so thick she can use her finger to remove it. She also adds her labia are very swollen. Pt informed that this could be from elevated blood sugars. Advised if she is unable to keep food on her stomach or becomes lethargic to proceed to the ED. She has an appointment with Dr. Laural BenesJohnson on tomorrow. Pt verbalized understanding.

## 2017-10-05 NOTE — Telephone Encounter (Signed)
Patient is calling because the DM medication is not working. Her DM is almost 500. Patient is sore and she said she has to pull white stuff out of her.

## 2017-10-06 ENCOUNTER — Ambulatory Visit: Payer: Medicare Other | Admitting: Internal Medicine

## 2017-10-17 ENCOUNTER — Ambulatory Visit: Payer: Medicare Other | Admitting: Nurse Practitioner

## 2017-11-30 ENCOUNTER — Ambulatory Visit: Payer: Medicare Other | Attending: Family Medicine | Admitting: Physician Assistant

## 2017-11-30 VITALS — BP 119/85 | HR 93 | Temp 98.2°F | Ht 71.0 in | Wt 296.4 lb

## 2017-11-30 DIAGNOSIS — Z79899 Other long term (current) drug therapy: Secondary | ICD-10-CM | POA: Insufficient documentation

## 2017-11-30 DIAGNOSIS — M79672 Pain in left foot: Secondary | ICD-10-CM

## 2017-11-30 DIAGNOSIS — Z794 Long term (current) use of insulin: Secondary | ICD-10-CM | POA: Diagnosis not present

## 2017-11-30 DIAGNOSIS — E119 Type 2 diabetes mellitus without complications: Secondary | ICD-10-CM | POA: Diagnosis not present

## 2017-11-30 LAB — POCT GLYCOSYLATED HEMOGLOBIN (HGB A1C): HEMOGLOBIN A1C: 11.5 % — AB (ref 4.0–5.6)

## 2017-11-30 LAB — POCT URINALYSIS DIP (CLINITEK)
BILIRUBIN UA: NEGATIVE
Glucose, UA: 500 mg/dL — AB
LEUKOCYTES UA: NEGATIVE
Nitrite, UA: NEGATIVE
RBC UA: NEGATIVE
Spec Grav, UA: 1.01 (ref 1.010–1.025)
Urobilinogen, UA: 0.2 E.U./dL

## 2017-11-30 LAB — GLUCOSE, POCT (MANUAL RESULT ENTRY)
POC GLUCOSE: 348 mg/dL — AB (ref 70–99)
POC Glucose: 378 mg/dl — AB (ref 70–99)

## 2017-11-30 MED ORDER — INSULIN ASPART 100 UNIT/ML ~~LOC~~ SOLN
25.0000 [IU] | Freq: Once | SUBCUTANEOUS | Status: AC
  Administered 2017-11-30: 25 [IU] via SUBCUTANEOUS

## 2017-11-30 MED ORDER — INSULIN GLARGINE 100 UNIT/ML ~~LOC~~ SOLN: 30.0000 [IU] | Freq: Two times a day (BID) | SUBCUTANEOUS | 3 refills | Status: DC

## 2017-11-30 MED ORDER — METFORMIN HCL 500 MG PO TABS: 1000.0000 mg | ORAL_TABLET | Freq: Two times a day (BID) | ORAL | 3 refills | Status: DC

## 2017-11-30 MED ORDER — LIRAGLUTIDE 18 MG/3ML ~~LOC~~ SOPN: 1.2000 mg | PEN_INJECTOR | Freq: Every day | SUBCUTANEOUS | 3 refills | Status: DC

## 2017-11-30 NOTE — Progress Notes (Signed)
Patient ID: Evelyn Watts, female   DOB: [birth year], 42 y.o.   MRN: [id number]   Evelyn Watts, is a 42 y.o. female  XJO:[id number]  EBR:[id number]  DOB - 07/08/1975  Subjective:  Chief Complaint and HPI: Evelyn Watts is a 42 y.o. female here today for L foot pain and prominence for about 1 month.  She did slip and fall a while ago at [employer] but didn't see a doctor about it.    Says she takes her diabetes meds all the time.  Metformin seems to make her feel nauseated at either dose.  She says she is compliant with Lantus.  She says she has not been able to get Victoza at the pharmacy due to PA challenges.  She says blood sugar always >300.     ROS:   Constitutional:  No f/c, No night sweats, No unexplained weight loss. EENT:  No vision changes, No blurry vision, No hearing changes. No mouth, throat, or ear problems.  Respiratory: No cough, No SOB Cardiac: No CP, no palpitations GI:  No abd pain, No N/V/D. GU: No Urinary s/sx Musculoskeletal: No joint pain Neuro: No headache, no dizziness, no motor weakness.  Skin: No rash Endocrine:  No polydipsia. No polyuria.  Psych: Denies SI/HI  No problems updated.  ALLERGIES: Allergies  Allergen Reactions  . Latex Rash  . Morphine And Related Rash  . Percocet [Oxycodone-Acetaminophen] Rash  . Tape Rash    PAST MEDICAL HISTORY: Past Medical History:  Diagnosis Date  . Asthma   . Diabetes mellitus without complication ([place])     MEDICATIONS AT HOME: Prior to Admission medications   Medication Sig Start Date End Date Taking? Authorizing Provider  albuterol (PROVENTIL HFA;VENTOLIN HFA) 108 (90 Base) MCG/ACT inhaler Inhale 1-2 puffs into the lungs every 6 (six) hours as needed for wheezing or shortness of breath. 09/18/16  Yes Ward, Ozella Almond, PA-C  blood glucose meter kit and supplies KIT Dispense based on patient and insurance preference. Use up to four times daily as directed. (FOR ICD-9 250.00, 250.01). 08/24/17   Yes Ena Dawley, Tiffany S, PA-C  insulin glargine (LANTUS) 100 UNIT/ML injection Inject 0.3 mLs (30 Units total) into the skin 2 (two) times daily. 11/30/17  Yes Freeman Caldron M, PA-C  lisinopril (PRINIVIL,ZESTRIL) 20 MG tablet Take 20 mg by mouth daily.    Yes [provider]  liraglutide (VICTOZA) 18 MG/3ML SOPN Inject 0.2 mLs (1.2 mg total) into the skin daily. 11/30/17   Argentina Donovan, PA-C  metFORMIN (GLUCOPHAGE) 500 MG tablet Take 2 tablets (1,000 mg total) by mouth 2 (two) times daily with a meal. 11/30/17 12/30/17  Argentina Donovan, PA-C     Objective:  EXAM:   Vitals:   11/30/17 1115  BP: 119/85  Pulse: 93  Temp: 98.2 F (36.8 C)  TempSrc: Oral  SpO2: 95%  Weight: 296 lb 6.4 oz (134.4 kg)  Height: '5\' 11"'  (1.803 m)    General appearance : A&OX3. NAD. Non-toxic-appearing HEENT: Atraumatic and Normocephalic.  PERRLA. EOM intact. Neck: supple, no JVD. No cervical lymphadenopathy. No thyromegaly Chest/Lungs:  Breathing-non-labored, Good air entry bilaterally, breath sounds normal without rales, rhonchi, or wheezing  CVS: S1 S2 regular, no murmurs, gallops, rubs  Extremities: Bilateral Lower Ext shows no edema, both legs are warm to touch with = pulse throughout.  Prominence on proximal mid-foot dorsally.  DP pulse=B.  Mild TTP.  ROM WNL.   Neurology:  CN II-XII grossly intact, Non focal.   Psych:  TP linear. J/I WNL. Normal speech. Appropriate eye contact and affect.  Skin:  No Rash  Data Review Lab Results  Component Value Date   HGBA1C 11.5 (A) 11/30/2017   HGBA1C 12.6 08/24/2017     Assessment & Plan   1. Diabetes mellitus without complication ([place]) Improved since 08/2017 but uncontrolled.  Diet discussed at length.  Spent >60mns face to face about diet and exercise as well as compliance-I am unsure if compliance with meds is an issue bc there is some conflicting information as she gives history and she isn't checking her blood sugars regularly.  Check blood sugars  fasting and bedtime and record and bring to next visit.  - Glucose (CBG) - HgB A1c - insulin aspart (novoLOG) injection 25 Units - POCT URINALYSIS DIP (CLINITEK) Increase and divide dosing- insulin glargine (LANTUS) 100 UNIT/ML injection; Inject 0.3 mLs (30 Units total) into the skin 2 (two) times daily.  Dispense: 10 mL; Refill: 3 Increase dose- metFORMIN (GLUCOPHAGE) 500 MG tablet; Take 2 tablets (1,000 mg total) by mouth 2 (two) times daily with a meal.  Dispense: 120 tablet; Refill: 3 I will try and order Victoza again since it has worked in the past but for now, implement the above changes- liraglutide (VICTOZA) 18 MG/3ML SOPN; Inject 0.2 mLs (1.2 mg total) into the skin daily.  Dispense: 6 mL; Refill: 3  2. Left foot pain with mild prominence - Ambulatory referral to Podiatry advil or tylenol for pain  Patient have been counseled extensively about nutrition and exercise  Return in about 1 month (around 12/31/2017) for assign PCP;  recheck blood sugars/DM.  The patient was given clear instructions to go to ER or return to medical center if symptoms don't improve, worsen or new problems develop. The patient verbalized understanding. The patient was told to call to get lab results if they haven't heard anything in the next week.     AFreeman Caldron PA-C [facility]  11/30/2017, 11:48 AM

## 2017-11-30 NOTE — Patient Instructions (Signed)
Check blood sugars fasting and at bedtime and record and bring to your next visit 

## 2017-12-14 ENCOUNTER — Ambulatory Visit: Payer: Medicare Other | Admitting: Podiatry

## 2018-01-05 ENCOUNTER — Ambulatory Visit: Payer: Medicare Other | Admitting: Family Medicine

## 2018-01-07 ENCOUNTER — Emergency Department (HOSPITAL_COMMUNITY): Payer: Medicare Other

## 2018-01-07 ENCOUNTER — Emergency Department (HOSPITAL_COMMUNITY)
Admission: EM | Admit: 2018-01-07 | Discharge: 2018-01-07 | Disposition: A | Discharge status: Home / Self Care | Payer: Medicare Other | Attending: Emergency Medicine | Admitting: Emergency Medicine

## 2018-01-07 ENCOUNTER — Other Ambulatory Visit: Payer: Self-pay

## 2018-01-07 ENCOUNTER — Encounter (HOSPITAL_COMMUNITY): Payer: Self-pay

## 2018-01-07 DIAGNOSIS — R739 Hyperglycemia, unspecified: Secondary | ICD-10-CM

## 2018-01-07 DIAGNOSIS — Z9049 Acquired absence of other specified parts of digestive tract: Secondary | ICD-10-CM | POA: Insufficient documentation

## 2018-01-07 DIAGNOSIS — J45909 Unspecified asthma, uncomplicated: Secondary | ICD-10-CM | POA: Diagnosis not present

## 2018-01-07 DIAGNOSIS — E1165 Type 2 diabetes mellitus with hyperglycemia: Secondary | ICD-10-CM | POA: Insufficient documentation

## 2018-01-07 DIAGNOSIS — Z79899 Other long term (current) drug therapy: Secondary | ICD-10-CM | POA: Insufficient documentation

## 2018-01-07 DIAGNOSIS — Z91040 Latex allergy status: Secondary | ICD-10-CM | POA: Insufficient documentation

## 2018-01-07 DIAGNOSIS — M79672 Pain in left foot: Secondary | ICD-10-CM | POA: Diagnosis not present

## 2018-01-07 DIAGNOSIS — Z794 Long term (current) use of insulin: Secondary | ICD-10-CM | POA: Diagnosis not present

## 2018-01-07 LAB — I-STAT TROPONIN, ED: Troponin i, poc: 0 ng/mL (ref 0.00–0.08)

## 2018-01-07 LAB — BASIC METABOLIC PANEL
ANION GAP: 10 (ref 5–15)
BUN: 15 mg/dL (ref 6–20)
CALCIUM: 9.2 mg/dL (ref 8.9–10.3)
CO2: 24 mmol/L (ref 22–32)
Chloride: 100 mmol/L (ref 98–111)
Creatinine, Ser: 0.63 mg/dL (ref 0.44–1.00)
GFR calc Af Amer: >60 mL/min (ref >60)
GFR calc non Af Amer: >60 mL/min (ref >60)
GLUCOSE: 422 mg/dL — AB (ref 70–99)
Potassium: 3.9 mmol/L (ref 3.5–5.1)
Sodium: 134 mmol/L — ABNORMAL LOW (ref 135–145)

## 2018-01-07 LAB — CBC WITH DIFFERENTIAL/PLATELET
BASOS ABS: 0 10*3/uL (ref 0.0–0.1)
Basophils Relative: 0 %
Eosinophils Absolute: 0.3 10*3/uL (ref 0.0–0.7)
Eosinophils Relative: 4 %
HCT: 37.4 % (ref 36.0–46.0)
Hemoglobin: 13.1 g/dL (ref 12.0–15.0)
LYMPHS ABS: 1.9 10*3/uL (ref 0.7–4.0)
LYMPHS PCT: 25 %
MCH: 26.6 pg (ref 26.0–34.0)
MCHC: 35 g/dL (ref 30.0–36.0)
MCV: 76 fL — AB (ref 78.0–100.0)
MONO ABS: 0.4 10*3/uL (ref 0.1–1.0)
Monocytes Relative: 5 %
NEUTROS ABS: 5 10*3/uL (ref 1.7–7.7)
NEUTROS PCT: 66 %
Platelets: 349 10*3/uL (ref 150–400)
RBC: 4.92 MIL/uL (ref 3.87–5.11)
RDW: 14.7 % (ref 11.5–15.5)
WBC: 7.6 10*3/uL (ref 4.0–10.5)

## 2018-01-07 LAB — URINALYSIS, ROUTINE W REFLEX MICROSCOPIC
BILIRUBIN URINE: NEGATIVE
Bacteria, UA: NONE SEEN
Glucose, UA: >=500 mg/dL — AB
Hgb urine dipstick: NEGATIVE
Ketones, ur: NEGATIVE mg/dL
Leukocytes, UA: NEGATIVE
NITRITE: NEGATIVE
PH: 6 (ref 5.0–8.0)
Protein, ur: NEGATIVE mg/dL
SPECIFIC GRAVITY, URINE: 1.029 (ref 1.005–1.030)

## 2018-01-07 LAB — CBG MONITORING, ED: Glucose-Capillary: 227 mg/dL — ABNORMAL HIGH (ref 70–99)

## 2018-01-07 MED ORDER — GLUCOSE BLOOD VI STRP: ORAL_STRIP | 0 refills | Status: DC

## 2018-01-07 MED ORDER — SODIUM CHLORIDE 0.9 % IV BOLUS
1000.0000 mL | Freq: Once | INTRAVENOUS | Status: AC
  Administered 2018-01-07: 1000 mL via INTRAVENOUS

## 2018-01-07 MED ORDER — INSULIN ASPART 100 UNIT/ML ~~LOC~~ SOLN
10.0000 [IU] | Freq: Once | SUBCUTANEOUS | Status: AC
  Administered 2018-01-07: 10 [IU] via INTRAVENOUS
  Filled 2018-01-07: qty 1

## 2018-01-07 NOTE — ED Notes (Signed)
Bed: [id number] Expected date:  Expected time:  Means of arrival:  Comments: [age] F Hyperglycemia

## 2018-01-07 NOTE — ED Provider Notes (Signed)
[facility] DEPT Provider Note   CSN: [id number] Arrival date & time: 01/07/18  0121     History   Chief Complaint Chief Complaint  Patient presents with  . Hyperglycemia  . Fatigue    HPI Evelyn Watts is a 42 y.o. female.  Patient with past medical history of uncontrolled diabetes presents to the emergency department with chief complaint of hyperglycemia and fatigue.  She states that she felt more fatigued than usual today.  This prompted her to check her blood sugar, and it was greater than 500.  She came to the emergency department for evaluation of this.  She states that she has had uncontrollable blood sugars since stopping Victoza a couple of years ago.  She states that she has been unable to get this refilled due to insurance preapproval problems.  She also complains of left foot pain which is been ongoing for about a month.  She states that she tripped and fell at a grocery store.  She has been told that she has a bone spur, but reports increasing pain in her foot.  She has tried taking metformin and Lantus with no improvement of her blood sugars.  She denies any other associated symptoms.  The history is provided by the patient. No language interpreter was used.    Past Medical History:  Diagnosis Date  . Asthma   . Diabetes mellitus without complication [facility])     Patient Active Problem List   Diagnosis Date Noted  . Diabetes mellitus without complication ([place]) 84/16/6063    Past Surgical History:  Procedure Laterality Date  . CESAREAN SECTION     4  . CHOLECYSTECTOMY    . HERNIA REPAIR    . TONSILLECTOMY       OB History   None      Home Medications    Prior to Admission medications   Medication Sig Start Date End Date Taking? Authorizing Provider  albuterol (PROVENTIL HFA;VENTOLIN HFA) 108 (90 Base) MCG/ACT inhaler Inhale 1-2 puffs into the lungs every 6 (six) hours as needed for wheezing or shortness of breath.  09/18/16   Ward, Ozella Almond, PA-C  blood glucose meter kit and supplies KIT Dispense based on patient and insurance preference. Use up to four times daily as directed. (FOR ICD-9 250.00, 250.01). 08/24/17   Brayton Caves, PA-C  insulin glargine (LANTUS) 100 UNIT/ML injection Inject 0.3 mLs (30 Units total) into the skin 2 (two) times daily. 11/30/17   Argentina Donovan, PA-C  liraglutide (VICTOZA) 18 MG/3ML SOPN Inject 0.2 mLs (1.2 mg total) into the skin daily. 11/30/17   Argentina Donovan, PA-C  lisinopril (PRINIVIL,ZESTRIL) 20 MG tablet Take 20 mg by mouth daily.     [provider]  metFORMIN (GLUCOPHAGE) 500 MG tablet Take 2 tablets (1,000 mg total) by mouth 2 (two) times daily with a meal. 11/30/17 12/30/17  Argentina Donovan, PA-C    Family History History reviewed. No pertinent family history.  Social History Social History   Tobacco Use  . Smoking status: Never Smoker  . Smokeless tobacco: Never Used  Substance Use Topics  . Alcohol use: No  . Drug use: No     Allergies   Latex; Morphine and related; Percocet [oxycodone-acetaminophen]; and Tape   Review of Systems Review of Systems  All other systems reviewed and are negative.    Physical Exam Updated Vital Signs BP 124/80 (BP Location: Right Arm)   Pulse 90   Temp 98.7  F (37.1 C) (Oral)   Resp 18   Ht _0  (1.803 m)   Wt 124.7 kg   SpO2 100%   BMI 38.35 kg/m   Physical Exam  Constitutional: She is oriented to person, place, and time. She appears well-developed and well-nourished.  Morbidly obese  HENT:  Head: Normocephalic and atraumatic.  Eyes: Pupils are equal, round, and reactive to light. Conjunctivae and EOM are normal.  Neck: Normal range of motion. Neck supple.  Cardiovascular: Normal rate and regular rhythm. Exam reveals no gallop and no friction rub.  No murmur heard. Pulmonary/Chest: Effort normal and breath sounds normal. No respiratory distress. She has no wheezes. She has no rales.  She exhibits no tenderness.  Abdominal: Soft. Bowel sounds are normal. She exhibits no distension and no mass. There is no tenderness. There is no rebound and no guarding.  Musculoskeletal: Normal range of motion. She exhibits no edema or tenderness.  Neurological: She is alert and oriented to person, place, and time.  Skin: Skin is warm and dry.  Psychiatric: She has a normal mood and affect. Her behavior is normal. Judgment and thought content normal.  Nursing note and vitals reviewed.    ED Treatments / Results  Labs (all labs ordered are listed, but only abnormal results are displayed) Labs Reviewed  URINALYSIS, ROUTINE W REFLEX MICROSCOPIC  CBC WITH DIFFERENTIAL/PLATELET  BASIC METABOLIC PANEL  CBG MONITORING, ED  I-STAT TROPONIN, ED    EKG None  Radiology No results found.  Procedures Procedures (including critical care time)  Medications Ordered in ED Medications - No data to display   Initial Impression / Assessment and Plan / ED Course  I have reviewed the triage vital signs and the nursing notes.  Pertinent labs & imaging results that were available during my care of the patient were reviewed by me and considered in my medical decision making (see chart for details).     Patient here with fatigue and generalized weakness.  Her glucose is noted to be high.  She has had difficulty controlling this.  States that it was greater than 500 today.  She denies any infectious symptoms.  She is in no acute distress, and is well-appearing.  Laboratory work-up is reassuring save for her hyperglycemia.  Given fluids and insulin.  Her glucose has trended down nicely.  Will discharge to home with primary care follow-up.  Patient asks for x-ray of left foot for bone spur versus hairline fracture.  There is a bone spur.  Will give cam walker.  Final Clinical Impressions(s) / ED Diagnoses   Final diagnoses:  Hyperglycemia  Left foot pain    ED Discharge Orders          Ordered    glucose blood test strip     01/07/18 0552           Montine Circle, PA-C 01/07/18 1610    Rolland Porter, MD 01/07/18 [phone]

## 2018-01-07 NOTE — Discharge Instructions (Signed)
Please follow-up with your doctor.  Please be mindful of your glucose levels.  Try to control this with diet and exercise in addition to medication.  Please follow-up with the orthopedic for the foot pain.  You may wear the boot as needed.

## 2018-01-07 NOTE — ED Provider Notes (Signed)
   ED ECG REPORT   Date: 01/07/2018  Rate: 87  Rhythm: normal sinus rhythm  QRS Axis: right  Intervals: normal  ST/T Wave abnormalities: nonspecific T wave changes  Conduction Disutrbances:none  Narrative Interpretation:   Old EKG Reviewed: none available  I have personally reviewed the EKG tracing and agree with the computerized printout as noted.    Devoria AlbeKnapp, Surya Schroeter, MD 01/07/18 [phone]

## 2018-01-07 NOTE — ED Triage Notes (Signed)
Pt presents to ED from home for hyperglycemia and fatigue. Pt reports that she takes insulin, but is without Victoza which usually manages her BG well. PT reports that she is having a problem with getting a prescription. Pt reports that she took a total of 65 units of lantus last night because her BG was over 500. Pt reports that she has been very fatigued lately.

## 2018-02-14 ENCOUNTER — Encounter: Payer: Self-pay | Admitting: Nurse Practitioner

## 2018-02-14 ENCOUNTER — Encounter

## 2018-02-14 ENCOUNTER — Ambulatory Visit: Payer: Medicare Other | Attending: Nurse Practitioner | Admitting: Nurse Practitioner

## 2018-02-14 VITALS — BP 130/86 | HR 103 | Temp 98.0°F | Ht 71.0 in | Wt 288.0 lb

## 2018-02-14 DIAGNOSIS — E1165 Type 2 diabetes mellitus with hyperglycemia: Secondary | ICD-10-CM | POA: Insufficient documentation

## 2018-02-14 DIAGNOSIS — I10 Essential (primary) hypertension: Secondary | ICD-10-CM | POA: Insufficient documentation

## 2018-02-14 DIAGNOSIS — J45909 Unspecified asthma, uncomplicated: Secondary | ICD-10-CM | POA: Diagnosis not present

## 2018-02-14 DIAGNOSIS — Z6841 Body Mass Index (BMI) 40.0 and over, adult: Secondary | ICD-10-CM | POA: Insufficient documentation

## 2018-02-14 DIAGNOSIS — Z885 Allergy status to narcotic agent status: Secondary | ICD-10-CM | POA: Diagnosis not present

## 2018-02-14 DIAGNOSIS — Z794 Long term (current) use of insulin: Secondary | ICD-10-CM | POA: Insufficient documentation

## 2018-02-14 DIAGNOSIS — M79672 Pain in left foot: Secondary | ICD-10-CM | POA: Diagnosis present

## 2018-02-14 DIAGNOSIS — Z79899 Other long term (current) drug therapy: Secondary | ICD-10-CM | POA: Diagnosis not present

## 2018-02-14 DIAGNOSIS — Z23 Encounter for immunization: Secondary | ICD-10-CM | POA: Diagnosis not present

## 2018-02-14 LAB — GLUCOSE, POCT (MANUAL RESULT ENTRY): POC Glucose: 278 mg/dl — AB (ref 70–99)

## 2018-02-14 MED ORDER — ATORVASTATIN CALCIUM 20 MG PO TABS: 20.0000 mg | ORAL_TABLET | Freq: Every day | ORAL | 3 refills | Status: DC

## 2018-02-14 MED ORDER — LISINOPRIL 20 MG PO TABS: 20.0000 mg | ORAL_TABLET | Freq: Every day | ORAL | 0 refills | Status: DC

## 2018-02-14 MED ORDER — ALBUTEROL SULFATE HFA 108 (90 BASE) MCG/ACT IN AERS: 1.0000 | INHALATION_SPRAY | Freq: Four times a day (QID) | RESPIRATORY_TRACT | 0 refills | Status: DC | PRN

## 2018-02-14 MED ORDER — LIRAGLUTIDE 18 MG/3ML ~~LOC~~ SOPN: 1.8000 mg | PEN_INJECTOR | Freq: Every day | SUBCUTANEOUS | 3 refills | Status: DC

## 2018-02-14 MED ORDER — MELOXICAM 15 MG PO TABS: 15.0000 mg | ORAL_TABLET | Freq: Every day | ORAL | 1 refills | Status: DC

## 2018-02-14 MED ORDER — MISC. DEVICES MISC: 3 refills | Status: DC

## 2018-02-14 MED ORDER — INSULIN GLARGINE 100 UNIT/ML ~~LOC~~ SOLN: 30.0000 [IU] | Freq: Two times a day (BID) | SUBCUTANEOUS | 3 refills | Status: DC

## 2018-02-14 MED ORDER — METFORMIN HCL 500 MG PO TABS: 1000.0000 mg | ORAL_TABLET | Freq: Two times a day (BID) | ORAL | 3 refills | Status: DC

## 2018-02-14 MED ORDER — GLUCOSE BLOOD VI STRP: ORAL_STRIP | 3 refills | Status: DC

## 2018-02-14 NOTE — Patient Instructions (Signed)
Heel Spur A heel spur is a bony growth that forms on the bottom of your heel bone (calcaneus). Heel spurs are common and do not always cause pain. However, heel spurs often cause inflammation in the strong band of tissue that runs underneath the bone of your foot (plantar fascia). When this happens, you may feel pain on the bottom of your foot, near your heel. What are the causes? The cause of heel spurs is not completely understood. They may be caused by pressure on the heel. Or, they may stem from the muscle attachments (tendons) near the spur pulling on the heel. What increases the risk? You may be at risk for a heel spur if you:  Are older than 40.  Are overweight.  Have wear and tear arthritis (osteoarthritis).  Have plantar fascia inflammation.  What are the signs or symptoms? Some people have heel spurs but no symptoms. If you do have symptoms, they may include:  Pain in the bottom of your heel.  Pain that is worse when you first get out of bed.  Pain that gets worse after walking or standing.  How is this diagnosed? Your health care provider may diagnose a heel spur based on your symptoms and a physical exam. You may also have an X-ray of your foot to check for a bony growth coming from the calcaneus. How is this treated? Treatment aims to relieve the pain from the heel spur. This may include:  Stretching exercises.  Losing weight.  Wearing specific shoes, inserts, or orthotics for comfort and support.  Wearing splints at night to properly position your feet.  Taking over-the-counter medicine to relieve pain.  Being treated with high-intensity sound waves to break up the heel spur (extracorporeal shock wave therapy).  Getting steroid injections in your heel to reduce swelling and ease pain.  Having surgery if your heel spur causes long-term (chronic) pain.  Follow these instructions at home:  Take medicines only as directed by your health care provider.  Ask  your health care provider if you should use ice or cold packs on the painful areas of your heel or foot.  Avoid activities that cause you pain until you recover or as directed by your health care provider.  Stretch before exercising or being physically active.  Wear supportive shoes that fit well as directed by your health care provider. You might need to buy new shoes. Wearing old shoes or shoes that do not fit correctly may not provide the support that you need.  Lose weight if your health care provider thinks you should. This can relieve pressure on your foot that may be causing pain and discomfort. Contact a health care provider if:  Your pain continues or gets worse. This information is not intended to replace advice given to you by your health care provider. Make sure you discuss any questions you have with your health care provider. Document Released: 05/18/2005 Document Revised: 09/17/2015 Document Reviewed: 06/12/2013 Elsevier Interactive Patient Education  2018 [employer]  

## 2018-02-14 NOTE — Progress Notes (Signed)
Assessment & Plan:  [name] was seen today for establish care.  Diagnoses and all orders for this visit:  Left foot pain -     Ambulatory referral to Podiatry -     meloxicam (MOBIC) 15 MG tablet; Take 1 tablet (15 mg total) by mouth daily.  Type 2 diabetes mellitus with hyperglycemia, with long-term current use of insulin (HCC) -     Glucose (CBG) -     Referral to Nutrition and Diabetes Services -     Lipid panel -     CMP14+EGFR -     atorvastatin (LIPITOR) 20 MG tablet; Take 1 tablet (20 mg total) by mouth daily. -     liraglutide (VICTOZA) 18 MG/3ML SOPN; Inject 0.3 mLs (1.8 mg total) into the skin daily. -     metFORMIN (GLUCOPHAGE) 500 MG tablet; Take 2 tablets (1,000 mg total) by mouth 2 (two) times daily with a meal. -     insulin glargine (LANTUS) 100 UNIT/ML injection; Inject 0.3 mLs (30 Units total) into the skin 2 (two) times daily. -     glucose blood test strip; Use as instructed. Check blood glucose levels by fingerstick twice per day -     Ambulatory referral to Ophthalmology -     Misc. Devices MISC; Please provide patient with glucerna for nutritional support. Continue blood sugar control as discussed in office today, low carbohydrate diet, and regular physical exercise as tolerated, 150 minutes per week (30 min each day, 5 days per week, or 50 min 3 days per week). Keep blood sugar logs with fasting goal of 90-130 mg/dl, post prandial (after you eat) less than 180.  For Hypoglycemia: BS <60 and Hyperglycemia BS >400; contact the clinic ASAP. Annual eye exams and foot exams are recommended.   Morbid obesity with BMI of 40.0-44.9, adult (HCC) -     TSH Discussed diet and exercise for person with BMI >40. Instructed: You must burn more calories than you eat. Losing 5 percent of your body weight should be considered a success. In the longer term, losing more than 15 percent of your body weight and staying at this weight is an extremely good result. However, keep in  mind that even losing 5 percent of your body weight leads to important health benefits, so try not to get discouraged if you're not able to lose more than this. Will recheck weight in 3-6 months.   Need for immunization against influenza -     Flu Vaccine QUAD 36+ mos IM     Patient has been counseled on age-appropriate routine health concerns for screening and prevention. These are reviewed and up-to-date. Referrals have been placed accordingly. Immunizations are up-to-date or declined.    Subjective:   Chief Complaint  Patient presents with  . Establish Care    Pt. is here to establish care. Pt. stated she is having a bad left leg and foot pain due to a fall incident in July.    HPI Evelyn Watts 42 y.o. female presents to office today to establish care. She has a history of DM, HTN. I do not have a lipid panel on file for her. She has complaints of left foot pain today.   Left Foot Pain She reports she fell at the [employer] while shopping 5 months ago. She has tried advil, motrin, tylenol, ibuprofen (800 mg), and aleve for pain relief with no improvement. She has on flip flops today and endorses pain in the left forefoot.  Pain is described as sharp and aching. Aggravating factors are weight bearing and direct pressure. She is able to bear full weight. She had an xray performed on her foot last month with impression below: FINDINGS: There is no evidence of fracture or dislocation. Mild osteoarthritis of the great toe involving the interphalangeal joint and metatarsal phalangeal joint. Minimal midfoot degenerative change. Prominent plantar calcaneal spur and small Achilles tendon enthesophyte. Soft tissues are unremarkable. IMPRESSION: No fracture or dislocation of the left foot. Scattered osteoarthritis.   DM Type 2 Diagnosed in 2005. She has never seen a nutritionist/dietician. Glucose is >250 today and she states "oh that's good".  She does not monitor her blood glucose  levels. She states she does not have any strips or lancets. We discussed poorly controlled diabetes as well as dietary modifications. I am not sure she has a good grasp on nutrition in regards to diabetes. She is morbidly obese but states she does not "eat a lot". Not sure how forthcoming she is in regards to her diet. Will order TSH today. She denies any hypo or hyperglycemic symptoms. Current medications include Victoza 1.2 ( will increase to 1.8 today based on glucose reading), metformin 1000 mg BID, and lantus 30units BID. She denies hypo or hyperglycemic symptoms. She is due for eye exam.  Lab Results  Component Value Date   HGBA1C 11.5 (A) 11/30/2017   CHRONIC HYPERTENSION Disease Monitoring  Blood pressure range BP Readings from Last 3 Encounters:  02/14/18 130/86  01/07/18 118/88  11/30/17 119/85   Chest pain: no   Dyspnea: no   Claudication: no  Medication compliance: yes, lisinopril 70m  Medication Side Effects  Lightheadedness: no   Urinary frequency: no   Edema: no   Impotence: no  Preventitive Healthcare:  Exercise: no   Diet Pattern: diet: general  Salt Restriction:  no  Review of Systems  Constitutional: Negative for fever, malaise/fatigue and weight loss.  HENT: Negative.  Negative for nosebleeds.   Eyes: Negative.  Negative for blurred vision, double vision and photophobia.  Respiratory: Positive for shortness of breath (history of asthma). Negative for cough.   Cardiovascular: Negative.  Negative for chest pain, palpitations and leg swelling.  Gastrointestinal: Negative.  Negative for heartburn, nausea and vomiting.  Musculoskeletal: Positive for joint pain (left foot pain). Negative for myalgias.       SEE HPI  Neurological: Negative.  Negative for dizziness, focal weakness, seizures and headaches.  Psychiatric/Behavioral: Negative.  Negative for suicidal ideas.    Past Medical History:  Diagnosis Date  . Asthma   . Diabetes mellitus without complication  ([facility]     Past Surgical History:  Procedure Laterality Date  . CESAREAN SECTION     4  . CHOLECYSTECTOMY    . HERNIA REPAIR    . TONSILLECTOMY      Family History  Problem Relation Age of Onset  . Hypertension Neg Hx   . Diabetes Neg Hx     Social History Reviewed with no changes to be made today.   Outpatient Medications Prior to Visit  Medication Sig Dispense Refill  . blood glucose meter kit and supplies KIT Dispense based on patient and insurance preference. Use up to four times daily as directed. (FOR ICD-9 250.00, 250.01). 1 each 0  . albuterol (PROVENTIL HFA;VENTOLIN HFA) 108 (90 Base) MCG/ACT inhaler Inhale 1-2 puffs into the lungs every 6 (six) hours as needed for wheezing or shortness of breath. 1 Inhaler 0  . glucose blood test  strip Use as instructed 100 each 0  . insulin glargine (LANTUS) 100 UNIT/ML injection Inject 0.3 mLs (30 Units total) into the skin 2 (two) times daily. 10 mL 3  . liraglutide (VICTOZA) 18 MG/3ML SOPN Inject 0.2 mLs (1.2 mg total) into the skin daily. 6 mL 3  . lisinopril (PRINIVIL,ZESTRIL) 20 MG tablet Take 20 mg by mouth daily.     . metFORMIN (GLUCOPHAGE) 500 MG tablet Take 2 tablets (1,000 mg total) by mouth 2 (two) times daily with a meal. 120 tablet 3   No facility-administered medications prior to visit.     Allergies  Allergen Reactions  . Latex Rash  . Morphine And Related Rash  . Tape Rash       Objective:    BP 130/86 (BP Location: Right Arm, Patient Position: Sitting, Cuff Size: Large)   Pulse (!) 103   Temp 98 F (36.7 C) (Oral)   Ht '5\' 11"'  (1.803 m)   Wt 288 lb (130.6 kg)   LMP 02/06/2018   SpO2 98%   BMI 40.17 kg/m  Wt Readings from Last 3 Encounters:  02/14/18 288 lb (130.6 kg)  01/07/18 275 lb (124.7 kg)  11/30/17 296 lb 6.4 oz (134.4 kg)    Physical Exam  Constitutional: She is oriented to person, place, and time. She appears well-developed and well-nourished. She is cooperative.  HENT:  Head:  Normocephalic and atraumatic.  Eyes: EOM are normal.  Neck: Normal range of motion.  Cardiovascular: Regular rhythm, normal heart sounds and intact distal pulses. Tachycardia present. Exam reveals no gallop and no friction rub.  No murmur heard. Pulmonary/Chest: Effort normal and breath sounds normal. No tachypnea. No respiratory distress. She has no decreased breath sounds. She has no wheezes. She has no rhonchi. She has no rales. She exhibits no tenderness.  Abdominal: Soft. Bowel sounds are normal.  Musculoskeletal: Normal range of motion. She exhibits no edema.  Neurological: She is alert and oriented to person, place, and time. Coordination normal.  Skin: Skin is warm and dry.  Psychiatric: She has a normal mood and affect. Her behavior is normal. Judgment and thought content normal.  Nursing note and vitals reviewed.      Patient has been counseled extensively about nutrition and exercise as well as the importance of adherence with medications and regular follow-up. The patient was given clear instructions to go to ER or return to medical center if symptoms don't improve, worsen or new problems develop. The patient verbalized understanding.   Follow-up: Return in about 3 weeks (around 03/07/2018) for DM .   Gildardo Pounds, FNP-BC [facility] [place], [place]   02/14/2018, 4:24 PM

## 2018-02-15 LAB — CMP14+EGFR
A/G RATIO: 1.5 (ref 1.2–2.2)
ALBUMIN: 4.1 g/dL (ref 3.5–5.5)
ALT: 17 IU/L (ref 0–32)
AST: 24 IU/L (ref 0–40)
Alkaline Phosphatase: 100 IU/L (ref 39–117)
BILIRUBIN TOTAL: 0.2 mg/dL (ref 0.0–1.2)
BUN/Creatinine Ratio: 17 (ref 9–23)
BUN: 10 mg/dL (ref 6–24)
CHLORIDE: 97 mmol/L (ref 96–106)
CO2: 18 mmol/L — ABNORMAL LOW (ref 20–29)
Calcium: 10 mg/dL (ref 8.7–10.2)
Creatinine, Ser: 0.58 mg/dL (ref 0.57–1.00)
GFR calc Af Amer: 133 mL/min/{1.73_m2} (ref >59)
GFR calc non Af Amer: 116 mL/min/{1.73_m2} (ref >59)
GLUCOSE: 309 mg/dL — AB (ref 65–99)
Globulin, Total: 2.8 g/dL (ref 1.5–4.5)
Potassium: 4.3 mmol/L (ref 3.5–5.2)
Sodium: 137 mmol/L (ref 134–144)
Total Protein: 6.9 g/dL (ref 6.0–8.5)

## 2018-02-15 LAB — LIPID PANEL
CHOLESTEROL TOTAL: 217 mg/dL — AB (ref 100–199)
Chol/HDL Ratio: 7.2 ratio — ABNORMAL HIGH (ref 0.0–4.4)
HDL: 30 mg/dL — AB (ref >39)
TRIGLYCERIDES: 631 mg/dL — AB (ref 0–149)

## 2018-02-15 LAB — TSH: TSH: 0.958 u[IU]/mL (ref 0.450–4.500)

## 2018-02-21 ENCOUNTER — Telehealth: Payer: Self-pay

## 2018-02-21 NOTE — Telephone Encounter (Signed)
-----   Message from Claiborne Rigg, NP sent at 02/18/2018  9:53 PM EDT ----- Triglyceride levels are extremely elevated. I would like for you to make a lab appointment for one morning at 830 and nothing to eat for 8 hours before your lab appointment. I need to make sure your labs are accurate prior to starting you on additional cholesterol lowering medication.

## 2018-02-21 NOTE — Telephone Encounter (Signed)
CMA spoke to patient.   Patient verified DOB. Patient understood.  Pt. Made a fasting lab appt. On 03/02/2018 for lipid panel.  Pt. Was inform to fast for 8 hours prior to the blood work to have an accurate reading of her cholesterol level.

## 2018-02-28 ENCOUNTER — Other Ambulatory Visit: Payer: Self-pay | Admitting: Nurse Practitioner

## 2018-02-28 DIAGNOSIS — E782 Mixed hyperlipidemia: Secondary | ICD-10-CM

## 2018-03-02 ENCOUNTER — Other Ambulatory Visit: Payer: Medicare Other

## 2018-03-05 ENCOUNTER — Ambulatory Visit: Payer: Medicare Other | Admitting: Dietician

## 2018-03-06 ENCOUNTER — Ambulatory Visit: Payer: Medicare Other | Admitting: Sports Medicine

## 2018-03-07 ENCOUNTER — Ambulatory Visit: Payer: Medicare Other | Admitting: Nurse Practitioner

## 2018-03-13 ENCOUNTER — Other Ambulatory Visit: Payer: Self-pay | Admitting: Sports Medicine

## 2018-03-13 ENCOUNTER — Encounter: Payer: Self-pay | Admitting: Sports Medicine

## 2018-03-13 ENCOUNTER — Ambulatory Visit (INDEPENDENT_AMBULATORY_CARE_PROVIDER_SITE_OTHER): Payer: Medicare Other

## 2018-03-13 ENCOUNTER — Ambulatory Visit (INDEPENDENT_AMBULATORY_CARE_PROVIDER_SITE_OTHER): Payer: Medicare Other | Admitting: Sports Medicine

## 2018-03-13 VITALS — BP 113/73 | HR 97 | Resp 16

## 2018-03-13 DIAGNOSIS — M79672 Pain in left foot: Secondary | ICD-10-CM

## 2018-03-13 DIAGNOSIS — M7752 Other enthesopathy of left foot: Secondary | ICD-10-CM | POA: Diagnosis not present

## 2018-03-13 DIAGNOSIS — M19079 Primary osteoarthritis, unspecified ankle and foot: Secondary | ICD-10-CM

## 2018-03-13 DIAGNOSIS — M779 Enthesopathy, unspecified: Secondary | ICD-10-CM | POA: Diagnosis not present

## 2018-03-13 MED ORDER — MELOXICAM 15 MG PO TABS: 15.0000 mg | ORAL_TABLET | Freq: Every day | ORAL | 1 refills | Status: DC

## 2018-03-13 MED ORDER — MELOXICAM 15 MG PO TABS: 15.0000 mg | ORAL_TABLET | Freq: Every day | ORAL | 0 refills | Status: DC

## 2018-03-13 MED ORDER — TRIAMCINOLONE ACETONIDE 10 MG/ML IJ SUSP
10.0000 mg | Freq: Once | INTRAMUSCULAR | Status: AC
  Administered 2018-03-13: 10 mg

## 2018-03-13 NOTE — Progress Notes (Signed)
Subjective: Evelyn Watts is a 42 y.o. female patient who presents to office for evaluation of left foot pain. Patient complains of progressive pain especially over the last year after a slip and fall in store. Reports excessive swelling and ranks pain unbearable sometimes and difficulty with walking and standing. Patient states that she has tried everything OTC and is requesting Pain meds. Patient denies any other pedal complaints.  Review of Systems  Cardiovascular: Positive for leg swelling.  Musculoskeletal: Positive for joint pain.  All other systems reviewed and are negative.    Patient Active Problem List   Diagnosis Date Noted  . Type 2 diabetes mellitus with hyperglycemia, with long-term current use of insulin ([place]) 02/14/2018  . Diabetes mellitus without complication ([place]) 37/62/8315  . Moderate episode of recurrent major depressive disorder ([place]) 05/24/2016  . Morbid obesity ([place]) 05/24/2016    Current Outpatient Medications on File Prior to Visit  Medication Sig Dispense Refill  . atorvastatin (LIPITOR) 20 MG tablet Take 1 tablet (20 mg total) by mouth daily. 90 tablet 3  . blood glucose meter kit and supplies KIT Dispense based on patient and insurance preference. Use up to four times daily as directed. (FOR ICD-9 250.00, 250.01). 1 each 0  . glucose blood test strip Use as instructed. Check blood glucose levels by fingerstick twice per day 100 each 3  . insulin glargine (LANTUS) 100 UNIT/ML injection Inject 0.3 mLs (30 Units total) into the skin 2 (two) times daily. 10 mL 3  . liraglutide (VICTOZA) 18 MG/3ML SOPN Inject 0.3 mLs (1.8 mg total) into the skin daily. 6 mL 3  . lisinopril (PRINIVIL,ZESTRIL) 20 MG tablet Take 1 tablet (20 mg total) by mouth daily. 90 tablet 0  . metFORMIN (GLUCOPHAGE) 500 MG tablet Take 2 tablets (1,000 mg total) by mouth 2 (two) times daily with a meal. 120 tablet 3  . Misc. Devices MISC Please provide patient with glucerna for nutritional  support. 48 each 3  . VENTOLIN HFA 108 (90 Base) MCG/ACT inhaler INHALE 1-2 PUFFS INTO THE LUNGS EVERY 6 (SIX) HOURS AS NEEDED FOR WHEEZING OR SHORTNESS OF BREATH. 18 Inhaler 0   No current facility-administered medications on file prior to visit.     Allergies  Allergen Reactions  . Latex Rash  . Morphine And Related Rash  . Tape Rash    Objective:  General: Alert and oriented x3 in no acute distress  Dermatology: No open lesions bilateral lower extremities, no webspace macerations, no ecchymosis bilateral, all nails x 10 are well manicured.  Vascular: Dorsalis Pedis and Posterior Tibial pedal pulses palpable, Capillary Fill Time 3 seconds,(+) pedal hair growth bilateral, no edema bilateral lower extremities, Temperature gradient within normal limits.  Neurology: Gross sensation intact via light touch bilateral.  Musculoskeletal:Pain to dorsolateral left foot that subjectively radiates to 5th toe. Strength within normal limits in all groups bilateral.  + Pes Planus.   Gait: Antalgic gait  Xrays  Left Foot   Impression:Midfoot arthritis, breech supportive of pes planus, no other acute findings.   Assessment and Plan: Problem List Items Addressed This Visit    None    Visit Diagnoses    Left foot pain    -  Primary   Relevant Medications   meloxicam (MOBIC) 15 MG tablet   triamcinolone acetonide (KENALOG) 10 MG/ML injection 10 mg (Completed) (Start on 03/13/2018  8:00 PM)   Capsulitis       Relevant Medications   triamcinolone acetonide (KENALOG) 10 MG/ML injection  10 mg (Completed) (Start on 03/13/2018  8:00 PM)   Tendonitis       Relevant Medications   triamcinolone acetonide (KENALOG) 10 MG/ML injection 10 mg (Completed) (Start on 03/13/2018  8:00 PM)   Arthritis of foot       Relevant Medications   meloxicam (MOBIC) 15 MG tablet   meloxicam (MOBIC) 15 MG tablet   triamcinolone acetonide (KENALOG) 10 MG/ML injection 10 mg (Completed) (Start on 03/13/2018  8:00 PM)       -Complete examination performed -Xrays reviewed -Discussed treatement options for tendonitis and arthritis  -After oral consent and aseptic prep, injected a mixture containing 1 ml of 2%  plain lidocaine, 1 ml 0.5% plain marcaine, 0.5 ml of kenalog 10 and 0.5 ml of dexamethasone phosphate into dorsolateral left foot without complication. Post-injection care discussed with patient.  -Rx Mobic and advised patient that I will not give her narcotics for this type of pain  -Recommend ice daily -Patient to return to office as needed or sooner if condition worsens.  Landis Martins, DPM

## 2018-03-14 ENCOUNTER — Ambulatory Visit: Payer: Medicare Other | Admitting: Nurse Practitioner

## 2018-03-19 ENCOUNTER — Encounter (HOSPITAL_COMMUNITY): Payer: Self-pay

## 2018-03-19 ENCOUNTER — Other Ambulatory Visit: Payer: Self-pay

## 2018-03-19 ENCOUNTER — Ambulatory Visit (INDEPENDENT_AMBULATORY_CARE_PROVIDER_SITE_OTHER): Payer: Medicare Other

## 2018-03-19 ENCOUNTER — Ambulatory Visit (HOSPITAL_COMMUNITY)
Admission: EM | Admit: 2018-03-19 | Discharge: 2018-03-19 | Disposition: A | Discharge status: Home / Self Care | Payer: Medicare Other | Attending: Family Medicine | Admitting: Family Medicine

## 2018-03-19 DIAGNOSIS — M19079 Primary osteoarthritis, unspecified ankle and foot: Secondary | ICD-10-CM

## 2018-03-19 MED ORDER — NAPROXEN 375 MG PO TABS: 375.0000 mg | ORAL_TABLET | Freq: Two times a day (BID) | ORAL | 0 refills | Status: DC

## 2018-03-19 NOTE — ED Triage Notes (Signed)
Pt cc ankle pain this started today.

## 2018-03-19 NOTE — Discharge Instructions (Addendum)
X-rays did not show fracture or dislocation, just arthritis Cam walker attached Continue conservative management of rest, ice, and elevation Take naproxen as needed for pain relief (may cause abdominal discomfort, ulcers, and GI bleeds avoid taking with other NSAIDs) Follow up with orthopedist if symptoms persist Return or go to the ER if you have any new or worsening symptoms (fever, chills, chest pain, abdominal pain, changes in bowel or bladder habits, pain radiating into lower legs, etc...)

## 2018-03-19 NOTE — ED Provider Notes (Signed)
[facility]   [id number] 03/19/18 Arrival Time: 1620  CC: Ankle pain  SUBJECTIVE: History from: patient. Evelyn Watts is a 42 y.o. female complains of left ankle pain that began today.  Denies a precipitating event or specific injury.  Localizes the pain to the outside of ankle.  Describes the pain as constant and sharp in character.  Has NOT tried OTC medications.  Symptoms are made worse with weight-bearing and walking.  Reports previous ankle fracture.  Complains of associated swelling.  Denies fever, chills, erythema, ecchymosis, weakness, numbness and tingling.      ROS: As per HPI.  Past Medical History:  Diagnosis Date  . Asthma   . Diabetes mellitus without complication [facility])    Past Surgical History:  Procedure Laterality Date  . CESAREAN SECTION     4  . CHOLECYSTECTOMY    . HERNIA REPAIR    . TONSILLECTOMY     Allergies  Allergen Reactions  . Latex Rash  . Morphine And Related Rash  . Tape Rash   No current facility-administered medications on file prior to encounter.    Current Outpatient Medications on File Prior to Encounter  Medication Sig Dispense Refill  . atorvastatin (LIPITOR) 20 MG tablet Take 1 tablet (20 mg total) by mouth daily. 90 tablet 3  . blood glucose meter kit and supplies KIT Dispense based on patient and insurance preference. Use up to four times daily as directed. (FOR ICD-9 250.00, 250.01). 1 each 0  . glucose blood test strip Use as instructed. Check blood glucose levels by fingerstick twice per day 100 each 3  . insulin glargine (LANTUS) 100 UNIT/ML injection Inject 0.3 mLs (30 Units total) into the skin 2 (two) times daily. 10 mL 3  . liraglutide (VICTOZA) 18 MG/3ML SOPN Inject 0.3 mLs (1.8 mg total) into the skin daily. 6 mL 3  . lisinopril (PRINIVIL,ZESTRIL) 20 MG tablet Take 1 tablet (20 mg total) by mouth daily. 90 tablet 0  . meloxicam (MOBIC) 15 MG tablet Take 1 tablet (15 mg total) by mouth daily. 30 tablet 0  .  meloxicam (MOBIC) 15 MG tablet Take 1 tablet (15 mg total) by mouth daily. 30 tablet 1  . metFORMIN (GLUCOPHAGE) 500 MG tablet Take 2 tablets (1,000 mg total) by mouth 2 (two) times daily with a meal. 120 tablet 3  . Misc. Devices MISC Please provide patient with glucerna for nutritional support. 48 each 3  . VENTOLIN HFA 108 (90 Base) MCG/ACT inhaler INHALE 1-2 PUFFS INTO THE LUNGS EVERY 6 (SIX) HOURS AS NEEDED FOR WHEEZING OR SHORTNESS OF BREATH. 18 Inhaler 0   Social History   Socioeconomic History  . Marital status: Married    Spouse name: Not on file  . Number of children: Not on file  . Years of education: Not on file  . Highest education level: Not on file  Occupational History  . Not on file  Social Needs  . Financial resource strain: Not on file  . Food insecurity:    Worry: Not on file    Inability: Not on file  . Transportation needs:    Medical: Not on file    Non-medical: Not on file  Tobacco Use  . Smoking status: Never Smoker  . Smokeless tobacco: Never Used  Substance and Sexual Activity  . Alcohol use: No  . Drug use: No  . Sexual activity: Not Currently  Lifestyle  . Physical activity:    Days per week: Not on file  Minutes per session: Not on file  . Stress: Not on file  Relationships  . Social connections:    Talks on phone: Not on file    Gets together: Not on file    Attends religious service: Not on file    Active member of club or organization: Not on file    Attends meetings of clubs or organizations: Not on file    Relationship status: Not on file  . Intimate partner violence:    Fear of current or ex partner: Not on file    Emotionally abused: Not on file    Physically abused: Not on file    Forced sexual activity: Not on file  Other Topics Concern  . Not on file  Social History Narrative  . Not on file   Family History  Problem Relation Age of Onset  . Hypertension Neg Hx   . Diabetes Neg Hx     OBJECTIVE:  Vitals:   03/19/18  1728 03/19/18 1731  BP:  (!) 149/82  Pulse:  98  Resp:  19  Temp:  98.6 F (37 C)  TempSrc:  Oral  SpO2:  100%  Weight: 222 lb (100.7 kg)     General appearance: AOx3; in no acute distress.  Head: NCAT Lungs: CTA bilaterally Heart: RRR.  Clear S1 and S2 without murmur, gallops, or rubs.  Radial pulses 2+ bilaterally. Musculoskeletal: Right ankle Inspection: Skin warm, dry, clear and intact without obvious erythema, or ecchymosis. Mild soft-tissue swelling over lateral ankle Palpation: Tender to palpation over the lateral malleolus ROM: LROM Strength: deferred due to pain Skin: warm and dry Neurologic: Antalgic gait; Sensation intact about the lower extremities Psychological: alert and cooperative; normal mood and affect  DIAGNOSTIC STUDIES:  Dg Ankle Complete Right  Result Date: 03/19/2018 CLINICAL DATA:  Right ankle swelling and pain EXAM: RIGHT ANKLE - COMPLETE 3+ VIEW COMPARISON:  None. FINDINGS: Mild soft tissue swelling overlying the malleoli. Well corticated 4 mm ossific structure below the medial malleolus. Plafond and talar dome unremarkable. Plantar and Achilles calcaneal spurs. There is some heterotopic ossification along interosseous ligament. Vascular calcifications including subcutaneous phleboliths and venous stasis calcifications along the cutaneous structures of the lower leg. There is some spurring at the Lisfranc joint. IMPRESSION: 1. Soft tissue swelling overlying the malleoli. No fracture identified. 2. Well-corticated small ossification below the medial malleolus, possibly an unfused ossification center or remote fracture fragment. 3. 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 4. Subcutaneous vascular phleboliths and fine cutaneous calcifications probably from venous stasis. Electronically Signed   By: Van Clines M.D.   On: 03/19/2018 18:46   Dg Foot Complete Left  Result Date: 03/13/2018 Please see detailed  radiograph report in office note.   ASSESSMENT & PLAN:  1. Arthritis of ankle    Meds ordered this encounter  Medications  . naproxen (NAPROSYN) 375 MG tablet    Sig: Take 1 tablet (375 mg total) by mouth 2 (two) times daily.    Dispense:  20 tablet    Refill:  0    Order Specific Question:   Supervising Provider    Answer:   Wynona Luna [[id number]]   X-rays did not show fracture or dislocation, just arthritis Cam walker given.  Use as needed for comfort Continue conservative management of rest, ice, and elevation Take naproxen as needed for pain relief (may cause abdominal discomfort, ulcers, and GI bleeds avoid taking with other NSAIDs) Follow up with orthopedist if symptoms  persist Return or go to the ER if you have any new or worsening symptoms (fever, chills, chest pain, abdominal pain, changes in bowel or bladder habits, pain radiating into lower legs, etc...)   Reviewed expectations re: course of current medical issues. Questions answered. Outlined signs and symptoms indicating need for more acute intervention. Patient verbalized understanding. After Visit Summary given.    Lestine Box, PA-C 03/19/18 1931

## 2018-04-12 ENCOUNTER — Other Ambulatory Visit: Payer: Self-pay | Admitting: Family Medicine

## 2018-04-24 ENCOUNTER — Telehealth: Payer: Self-pay | Admitting: Sports Medicine

## 2018-04-24 NOTE — Telephone Encounter (Signed)
Pt was prescribed medication for heel pain and pt states that the medication does not seem to be helping. Pt is having to walk on tip toes to keep pressure off of her heel. Can something else be prescribed or should pt come back in to be seen? Please give pt a call.

## 2018-04-24 NOTE — Telephone Encounter (Signed)
I called pt and offered an appt, pt accepted and I transferred to scheduler.

## 2018-05-01 ENCOUNTER — Ambulatory Visit: Payer: Medicare Other | Admitting: Sports Medicine

## 2018-05-02 ENCOUNTER — Other Ambulatory Visit: Payer: Self-pay | Admitting: Nurse Practitioner

## 2018-05-17 ENCOUNTER — Other Ambulatory Visit: Payer: Self-pay | Admitting: Nurse Practitioner

## 2018-05-17 DIAGNOSIS — Z794 Long term (current) use of insulin: Principal | ICD-10-CM

## 2018-05-17 DIAGNOSIS — E1165 Type 2 diabetes mellitus with hyperglycemia: Secondary | ICD-10-CM

## 2018-05-22 ENCOUNTER — Other Ambulatory Visit: Payer: Self-pay

## 2018-05-22 ENCOUNTER — Emergency Department (HOSPITAL_COMMUNITY)
Admission: EM | Admit: 2018-05-22 | Discharge: 2018-05-23 | Disposition: A | Discharge status: Home / Self Care | Payer: Medicare Other | Attending: Emergency Medicine | Admitting: Emergency Medicine

## 2018-05-22 ENCOUNTER — Encounter (HOSPITAL_COMMUNITY): Payer: Self-pay | Admitting: Emergency Medicine

## 2018-05-22 DIAGNOSIS — N898 Other specified noninflammatory disorders of vagina: Secondary | ICD-10-CM | POA: Diagnosis present

## 2018-05-22 DIAGNOSIS — Z5321 Procedure and treatment not carried out due to patient leaving prior to being seen by health care provider: Secondary | ICD-10-CM | POA: Diagnosis not present

## 2018-05-22 LAB — URINALYSIS, ROUTINE W REFLEX MICROSCOPIC
Bilirubin Urine: NEGATIVE
HGB URINE DIPSTICK: NEGATIVE
KETONES UR: NEGATIVE mg/dL
LEUKOCYTES UA: NEGATIVE
Nitrite: NEGATIVE
PROTEIN: NEGATIVE mg/dL
Specific Gravity, Urine: 1.031 — ABNORMAL HIGH (ref 1.005–1.030)
pH: 6 (ref 5.0–8.0)

## 2018-05-22 LAB — CBC
HCT: 39.9 % (ref 36.0–46.0)
Hemoglobin: 13.1 g/dL (ref 12.0–15.0)
MCH: 25.9 pg — ABNORMAL LOW (ref 26.0–34.0)
MCHC: 32.8 g/dL (ref 30.0–36.0)
MCV: 79 fL — AB (ref 80.0–100.0)
Platelets: 380 10*3/uL (ref 150–400)
RBC: 5.05 MIL/uL (ref 3.87–5.11)
RDW: 13.7 % (ref 11.5–15.5)
WBC: 10.6 10*3/uL — ABNORMAL HIGH (ref 4.0–10.5)
nRBC: 0 % (ref 0.0–0.2)

## 2018-05-22 LAB — BASIC METABOLIC PANEL
Anion gap: 11 (ref 5–15)
BUN: 10 mg/dL (ref 6–20)
CALCIUM: 9.1 mg/dL (ref 8.9–10.3)
CO2: 20 mmol/L — AB (ref 22–32)
CREATININE: 0.64 mg/dL (ref 0.44–1.00)
Chloride: 101 mmol/L (ref 98–111)
GFR calc Af Amer: >60 mL/min (ref >60)
GLUCOSE: 333 mg/dL — AB (ref 70–99)
Potassium: 3.8 mmol/L (ref 3.5–5.1)
Sodium: 132 mmol/L — ABNORMAL LOW (ref 135–145)

## 2018-05-22 LAB — I-STAT BETA HCG BLOOD, ED (MC, WL, AP ONLY): I-stat hCG, quantitative: <5 m[IU]/mL (ref <5)

## 2018-05-22 LAB — CBG MONITORING, ED: GLUCOSE-CAPILLARY: 316 mg/dL — AB (ref 70–99)

## 2018-05-22 NOTE — ED Notes (Signed)
Pt said she needed food for her blood sugar in the waiting room, she was advised that she could purchase something from the vending machines or she could have crackers and juice.  She refused both and remained verbally combative, calling this EMT a "bitch"

## 2018-05-22 NOTE — ED Notes (Signed)
No answer for treatment room. 

## 2018-05-22 NOTE — ED Triage Notes (Addendum)
C/o vaginal pain x 5 months with brown and yellow discharge.  Pt states symptoms may be related to high blood sugar.

## 2018-05-23 NOTE — ED Notes (Signed)
No answer for treatment room. 

## 2018-05-30 ENCOUNTER — Ambulatory Visit: Payer: Medicare Other

## 2018-06-13 ENCOUNTER — Telehealth: Payer: Self-pay | Admitting: General Practice

## 2018-06-13 NOTE — Telephone Encounter (Signed)
Patient called with a complaint of vaginal bleeding.    Patient has a short menstrual cycle for her of 3 days.  Her cycle stopped abruptly.   day and a half later patient had a sudden "gush" of blood and clots."  Patient stated she has had two episodes in the last 36 hours.    Patient states she has had 8/10 pain in her lower abdomen since the beginning of her episodes. Patient attested that she is still having vaginal bleeding.   Patient advised to go to the Emergency Room for evaluation.

## 2018-06-13 NOTE — Telephone Encounter (Signed)
Patient called asking to speak to the nurse about her cycle.

## 2018-06-20 ENCOUNTER — Other Ambulatory Visit: Payer: Self-pay | Admitting: Nurse Practitioner

## 2018-06-20 DIAGNOSIS — Z794 Long term (current) use of insulin: Principal | ICD-10-CM

## 2018-06-20 DIAGNOSIS — E1165 Type 2 diabetes mellitus with hyperglycemia: Secondary | ICD-10-CM

## 2018-07-06 ENCOUNTER — Other Ambulatory Visit: Payer: Self-pay | Admitting: Nurse Practitioner

## 2018-07-06 DIAGNOSIS — E1165 Type 2 diabetes mellitus with hyperglycemia: Secondary | ICD-10-CM

## 2018-07-06 DIAGNOSIS — Z794 Long term (current) use of insulin: Principal | ICD-10-CM

## 2018-08-30 IMAGING — DX DG CHEST 2V
2 series · 2 of 2 positions shown · non-contrast
Comparison: 04/07/2016

CLINICAL DATA: Nonproductive cough, wheezing

EXAM:
CHEST  2 VIEW

[w chest pa]
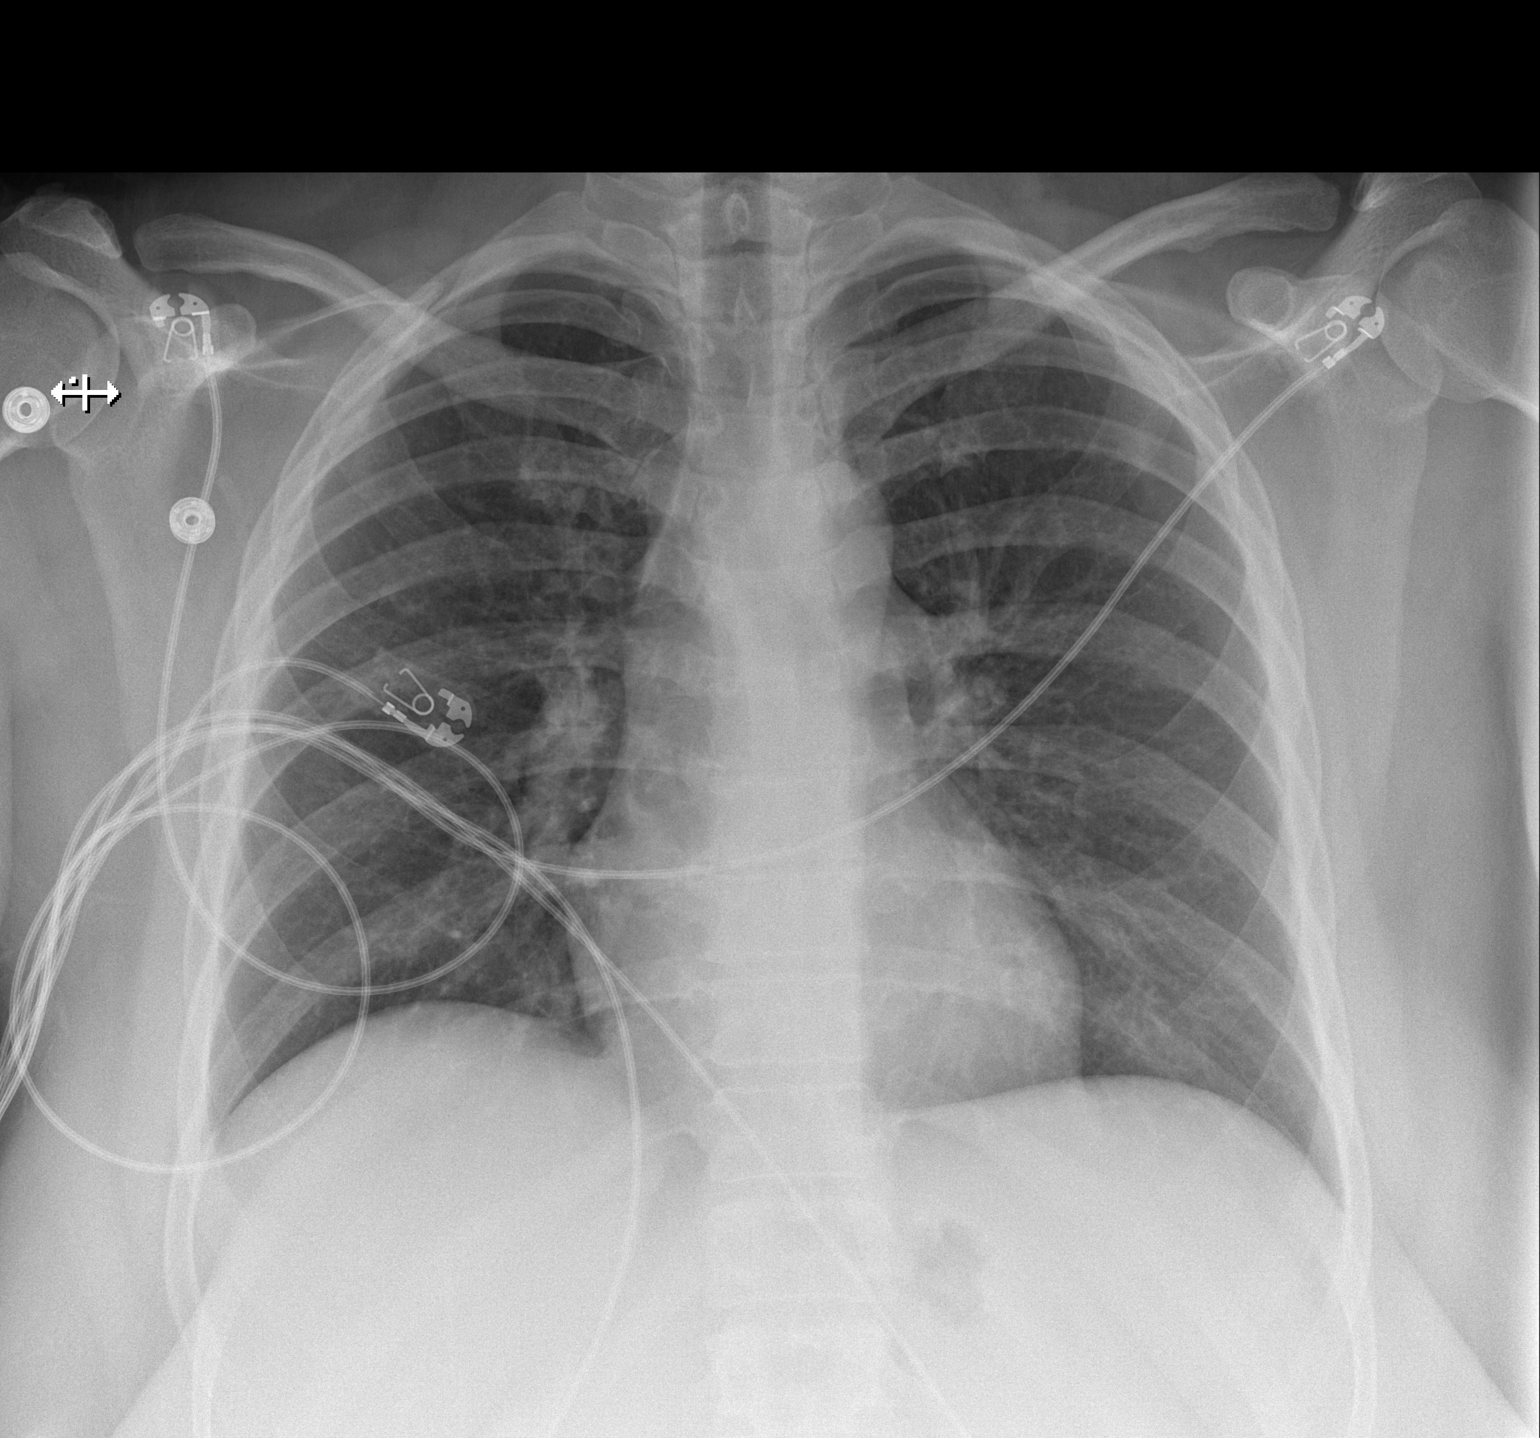

[w chest lat]
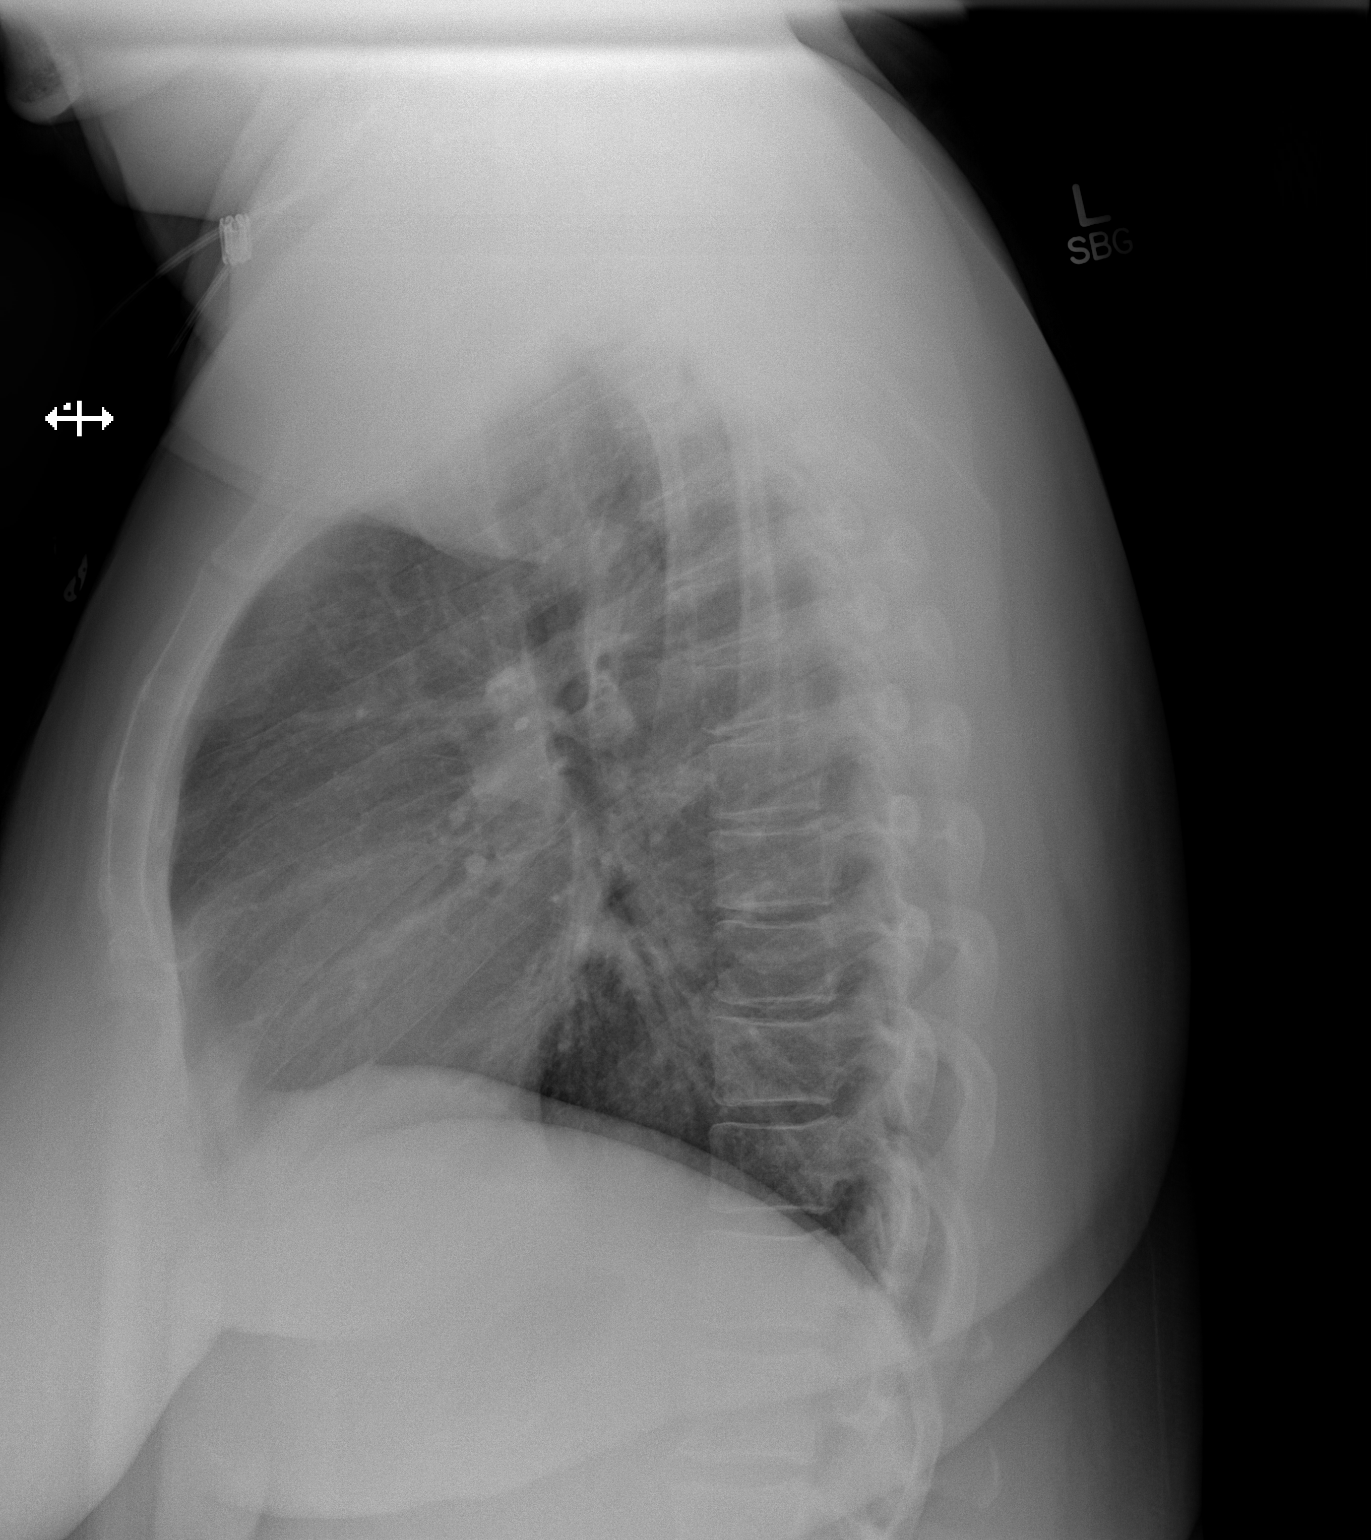

[2 of 2 positions shown; findings below may reference images not displayed]

FINDINGS: The heart size and mediastinal contours are within normal limits.
Both lungs are clear. The visualized skeletal structures are
unremarkable.
IMPRESSION: No active cardiopulmonary disease.

## 2018-10-10 ENCOUNTER — Emergency Department (HOSPITAL_COMMUNITY)
Admission: EM | Admit: 2018-10-10 | Discharge: 2018-10-10 | Discharge status: Against Medical Advice | Payer: Medicare Other | Attending: Emergency Medicine | Admitting: Emergency Medicine

## 2018-10-10 ENCOUNTER — Emergency Department (HOSPITAL_COMMUNITY): Payer: Medicare Other

## 2018-10-10 DIAGNOSIS — J45909 Unspecified asthma, uncomplicated: Secondary | ICD-10-CM | POA: Diagnosis not present

## 2018-10-10 DIAGNOSIS — R2232 Localized swelling, mass and lump, left upper limb: Secondary | ICD-10-CM | POA: Diagnosis not present

## 2018-10-10 DIAGNOSIS — W010XXA Fall on same level from slipping, tripping and stumbling without subsequent striking against object, initial encounter: Secondary | ICD-10-CM | POA: Diagnosis not present

## 2018-10-10 DIAGNOSIS — Z79899 Other long term (current) drug therapy: Secondary | ICD-10-CM | POA: Diagnosis not present

## 2018-10-10 DIAGNOSIS — M79642 Pain in left hand: Secondary | ICD-10-CM | POA: Insufficient documentation

## 2018-10-10 DIAGNOSIS — Y998 Other external cause status: Secondary | ICD-10-CM | POA: Insufficient documentation

## 2018-10-10 DIAGNOSIS — R739 Hyperglycemia, unspecified: Secondary | ICD-10-CM

## 2018-10-10 DIAGNOSIS — Z91040 Latex allergy status: Secondary | ICD-10-CM | POA: Diagnosis not present

## 2018-10-10 DIAGNOSIS — E1165 Type 2 diabetes mellitus with hyperglycemia: Secondary | ICD-10-CM | POA: Diagnosis not present

## 2018-10-10 DIAGNOSIS — Y929 Unspecified place or not applicable: Secondary | ICD-10-CM | POA: Insufficient documentation

## 2018-10-10 DIAGNOSIS — W19XXXA Unspecified fall, initial encounter: Secondary | ICD-10-CM

## 2018-10-10 DIAGNOSIS — Z794 Long term (current) use of insulin: Secondary | ICD-10-CM | POA: Diagnosis not present

## 2018-10-10 DIAGNOSIS — Y9389 Activity, other specified: Secondary | ICD-10-CM | POA: Insufficient documentation

## 2018-10-10 LAB — I-STAT BETA HCG BLOOD, ED (MC, WL, AP ONLY): I-stat hCG, quantitative: <5 m[IU]/mL (ref <5)

## 2018-10-10 LAB — I-STAT CHEM 8, ED
BUN: 12 mg/dL (ref 6–20)
Calcium, Ion: 1.24 mmol/L (ref 1.15–1.40)
Chloride: 100 mmol/L (ref 98–111)
Creatinine, Ser: 0.5 mg/dL (ref 0.44–1.00)
Glucose, Bld: 439 mg/dL — ABNORMAL HIGH (ref 70–99)
HCT: 34 % — ABNORMAL LOW (ref 36.0–46.0)
Hemoglobin: 11.6 g/dL — ABNORMAL LOW (ref 12.0–15.0)
Potassium: 4 mmol/L (ref 3.5–5.1)
Sodium: 133 mmol/L — ABNORMAL LOW (ref 135–145)
TCO2: 23 mmol/L (ref 22–32)

## 2018-10-10 LAB — CBG MONITORING, ED: Glucose-Capillary: 411 mg/dL — ABNORMAL HIGH (ref 70–99)

## 2018-10-10 MED ORDER — ACETAMINOPHEN 325 MG PO TABS
650.0000 mg | ORAL_TABLET | Freq: Once | ORAL | Status: AC
  Administered 2018-10-10: 650 mg via ORAL
  Filled 2018-10-10: qty 2

## 2018-10-10 MED ORDER — DOXYCYCLINE HYCLATE 100 MG PO CAPS: 100.0000 mg | ORAL_CAPSULE | Freq: Two times a day (BID) | ORAL | 0 refills | Status: AC

## 2018-10-10 MED ORDER — BLOOD GLUCOSE MONITOR KIT: PACK | 0 refills | Status: DC

## 2018-10-10 MED ORDER — KETOROLAC TROMETHAMINE 30 MG/ML IJ SOLN: 30.0000 mg | Freq: Once | INTRAMUSCULAR | Status: DC

## 2018-10-10 NOTE — Discharge Instructions (Addendum)
You may take Tylenol and Motrin for your pain.  Please keep the hand elevated to help reduce swelling.  You may use ice packs for 20 minutes at a time every hour to help with the swelling and pain.  You were also given a splint for comfort.  Please follow-up with your regular doctor in regards to your fall and your hand pain today.  You also need to follow-up in regards to your elevated blood sugars.  Today you left before we obtained your urinalysis and therefore we are unable to definitively rule out diabetic ketoacidosis, however if you choose to return to the emergency department for the remainder of your work-up you are more than welcome to do so.  You are also advised to return to the emergency department for any new or worsening symptoms in the meantime.

## 2018-10-10 NOTE — ED Notes (Addendum)
Pt states husband is demanding she come outside right now, she is not allowed to wait for PA to finish her work-up. PA Couture aware. Pt states she feels safe at home. States she can't stay for vitals.

## 2018-10-10 NOTE — ED Provider Notes (Signed)
[place] EMERGENCY DEPARTMENT Provider Note   CSN: [id number] Arrival date & time: 10/10/18  1932    History   Chief Complaint Chief Complaint  Patient presents with  . Hand Injury    HPI Evelyn Watts is a 43 y.o. female.     HPI  Patient is a 43 year old female history of asthma, T2DM, who presents the emergency department today for evaluation after a fall.  States that she was trying to move a mattress prior to arrival when she fell and landed on her left hand.  She is complaining of pain mainly to the left index finger.  There is some swelling associated with this.  She reports decreased sensation to the dorsum of the right hand.  Denies any pain in the wrist.  Eyes any other injuries including no head trauma, LOC, neck or back pain.   She was noted to have elevated blood sugars while in the ED.  On further questioning about this she states that she needs a new glucometer at home and she does not have any of her diabetes testing supplies.  She does state that she has an appropriate amount of her medications.  Has had no recent vomiting diarrhea, abdominal pain, or other symptoms of DKA.  Patient further mentions that she has an area of redness and pain to the right groin area that has been present for several days.  She has not seen any drainage from the area.  She has had no fevers.  Past Medical History:  Diagnosis Date  . Asthma   . Diabetes mellitus without complication [facility])     Patient Active Problem List   Diagnosis Date Noted  . Type 2 diabetes mellitus with hyperglycemia, with long-term current use of insulin ([place]) 02/14/2018  . Diabetes mellitus without complication ([place]) 95/12/3265  . Moderate episode of recurrent major depressive disorder ([place]) 05/24/2016  . Morbid obesity ([place]) 05/24/2016    Past Surgical History:  Procedure Laterality Date  . CESAREAN SECTION     4  . CHOLECYSTECTOMY    . HERNIA REPAIR    . TONSILLECTOMY       OB History   No obstetric history on file.      Home Medications    Prior to Admission medications   Medication Sig Start Date End Date Taking? Authorizing Provider  atorvastatin (LIPITOR) 20 MG tablet Take 1 tablet (20 mg total) by mouth daily. 02/14/18   Gildardo Pounds, NP  blood glucose meter kit and supplies KIT Dispense based on patient and insurance preference. Use up to four times daily as directed. (FOR ICD-9 250.00, 250.01). 10/10/18   Christle Nolting S, PA-C  doxycycline (VIBRAMYCIN) 100 MG capsule Take 1 capsule (100 mg total) by mouth 2 (two) times daily for 7 days. 10/10/18 10/17/18  Krysta Bloomfield S, PA-C  glucose blood test strip Use as instructed. Check blood glucose levels by fingerstick twice per day 02/14/18   Gildardo Pounds, NP  insulin glargine (LANTUS) 100 UNIT/ML injection Inject 0.3 mLs (30 Units total) into the skin 2 (two) times daily. 02/14/18   Gildardo Pounds, NP  liraglutide (VICTOZA) 18 MG/3ML SOPN Inject 0.3 mLs (1.8 mg total) into the skin daily. 02/14/18 03/16/18  Gildardo Pounds, NP  lisinopril (PRINIVIL,ZESTRIL) 20 MG tablet Take 1 tablet (20 mg total) by mouth daily. 02/14/18   Gildardo Pounds, NP  meloxicam (MOBIC) 15 MG tablet Take 1 tablet (15 mg total) by mouth daily. 03/13/18  Landis Martins, DPM  meloxicam (MOBIC) 15 MG tablet Take 1 tablet (15 mg total) by mouth daily. 03/13/18   Landis Martins, DPM  metFORMIN (GLUCOPHAGE) 500 MG tablet Take 2 tablets (1,000 mg total) by mouth 2 (two) times daily with a meal. MUST MAKE APPT FOR FURTHER REFILLS 05/18/18   Gildardo Pounds, NP  Misc. Devices MISC Please provide patient with glucerna for nutritional support. 02/14/18   Gildardo Pounds, NP  naproxen (NAPROSYN) 375 MG tablet Take 1 tablet (375 mg total) by mouth 2 (two) times daily. 03/19/18   Wurst, [place], PA-C  VENTOLIN HFA 108 (90 Base) MCG/ACT inhaler INHALE ONE TO TWO PUFFS INTO THE LUNGS EVERY 6 HOURS AS NEEDED FOR WHEEZING OR SHORTNESS  OF BREATH 04/13/18   Gildardo Pounds, NP    Family History Family History  Problem Relation Age of Onset  . Hypertension Neg Hx   . Diabetes Neg Hx     Social History Social History   Tobacco Use  . Smoking status: Never Smoker  . Smokeless tobacco: Never Used  Substance Use Topics  . Alcohol use: No  . Drug use: No     Allergies   Latex, Morphine and related, and Tape   Review of Systems Review of Systems  Constitutional: Negative for chills and fever.  HENT: Negative for ear pain and sore throat.   Eyes: Negative for visual disturbance.  Respiratory: Negative for cough and shortness of breath.   Cardiovascular: Negative for chest pain.  Gastrointestinal: Negative for abdominal pain, nausea and vomiting.  Genitourinary: Negative for flank pain.  Musculoskeletal: Negative for back pain and neck pain.       Left hand pain  Skin: Positive for color change (right inguinal area). Negative for rash.  Neurological: Negative for headaches.       No head trauma or loc  All other systems reviewed and are negative.    Physical Exam Updated Vital Signs BP (!) 144/87 (BP Location: Right Wrist)   Pulse (!) 110   Temp 98.7 F (37.1 C) (Oral)   Resp 18   SpO2 100%   Physical Exam Vitals signs and nursing note reviewed.  Constitutional:      General: She is not in acute distress.    Appearance: She is well-developed.  HENT:     Head: Normocephalic and atraumatic.  Eyes:     Conjunctiva/sclera: Conjunctivae normal.  Neck:     Musculoskeletal: Neck supple.  Cardiovascular:     Rate and Rhythm: Normal rate and regular rhythm.     Heart sounds: No murmur.  Pulmonary:     Effort: Pulmonary effort is normal. No respiratory distress.     Breath sounds: Normal breath sounds.  Abdominal:     Palpations: Abdomen is soft.     Tenderness: There is no abdominal tenderness.  Musculoskeletal:     Comments: TTP over the first MCP and index finger on the left hand with mild  associated swelling.  Patient reports decreased sensation to this portion of the hand.  Radial/ulnar pulses intact.  Flexion/extension intact of the left wrist without pain.  No anatomical snuffbox tenderness.  No significant tenderness throughout the remainder of the hand.  Exam somewhat limited as patient not participating, but she does appear to have good range of motion.  Neurovascularly intact distally  Skin:    General: Skin is warm and dry.     Comments: induration and erythema to the inguinal area but no evidence of fluctuance  or obvious drainable abscess. No crepitus  Neurological:     Mental Status: She is alert.     ED Treatments / Results  Labs (all labs ordered are listed, but only abnormal results are displayed) Labs Reviewed  CBG MONITORING, ED - Abnormal; Notable for the following components:      Result Value   Glucose-Capillary 411 (*)    All other components within normal limits  I-STAT CHEM 8, ED - Abnormal; Notable for the following components:   Sodium 133 (*)    Glucose, Bld 439 (*)    Hemoglobin 11.6 (*)    HCT 34.0 (*)    All other components within normal limits  URINALYSIS, ROUTINE W REFLEX MICROSCOPIC  I-STAT BETA HCG BLOOD, ED ([facility], [facility], [facility] ONLY)    EKG None  Radiology Dg Hand Complete Left  Result Date: 10/10/2018 CLINICAL DATA:  Fall EXAM: LEFT HAND - COMPLETE 3+ VIEW COMPARISON:  07/09/2015 FINDINGS: There is no evidence of fracture or dislocation. There is no evidence of arthropathy or other focal bone abnormality. Soft tissues are unremarkable. IMPRESSION: Negative. Electronically Signed   By: Donavan Foil M.D.   On: 10/10/2018 20:50    Procedures Procedures (including critical care time)  Medications Ordered in ED Medications  ketorolac (TORADOL) 30 MG/ML injection 30 mg (has no administration in time range)  acetaminophen (TYLENOL) tablet 650 mg (650 mg Oral Given 10/10/18 2042)     Initial Impression / Assessment and Plan / ED Course   I have reviewed the triage vital signs and the nursing notes.  Pertinent labs & imaging results that were available during my care of the patient were reviewed by me and considered in my medical decision making (see chart for details).     Final Clinical Impressions(s) / ED Diagnoses   Final diagnoses:  Fall, initial encounter  Left hand pain  Hyperglycemia   43 year old female presenting for evaluation of a mechanical fall that occurred prior to arrival.  She has no injuries except for pain to the left hand.  Exam is somewhat limited secondary to effort but she appears to be neurovascularly intact without any obvious deformity.  X-ray of the left hand is negative. I ordered a finger splint for comfort and advised tylenol and motrin for pain   She also mentioned swelling and erythema to the right inguinal area.  On exam it appears that she has some induration and erythema to the inguinal area but no evidence of fluctuance or obvious drainable abscess.  She is afebrile nontoxic will place her on an antibiotic for this and have her follow-up closely with her PCP.    She was also noted to be hyperglycemic on arrival therefore i-STAT Chem-8 was drawn.  Anion gap is normal at 10.  Blood sugar is elevated at 439.  Normal bicarb.  Attempted to obtain a UA however prior to collection of this the patient stated that she needed to leave abruptly as her husband was on his way.  I discussed that we are unable to definitively rule out DKA  and that we would need to recheck blood sugar however she declined further w/u about this. I again advised to f/u with her pcp in regards to this. I gave rx for glucometer for home.  10:29 PM It was noted that after the patient's discharge, she had not received antibiotic prescription. Contacted the pt in regards to Rx for abx. Left a HIPPA compliant voicemail that abx will be sent to her pharmacy  at [facility].   ED Discharge Orders         Ordered     blood glucose meter kit and supplies KIT     10/10/18 2123    doxycycline (VIBRAMYCIN) 100 MG capsule  2 times daily     10/10/18 [place], Haward Pope S, PA-C 10/10/18 [place], [place], DO 10/10/18 2316

## 2018-10-10 NOTE — ED Triage Notes (Signed)
Pt arrived via [facility] from home after slipping and falling on her open left hand. Pt denies LOC or striking head. Pt is alert and oriented x4. States pain is 10/10. Left hand splinted by EMS PTA, swelling noted at time of triage. Pt has hx of DM. EMS v/s: 146/90, hr110, 98%ra, 97.9.

## 2018-10-10 NOTE — ED Notes (Addendum)
AMA instructions discussed with Pt. Pt verbalized understanding. Pt stable and ambulatory.

## 2018-10-19 ENCOUNTER — Other Ambulatory Visit: Payer: Self-pay

## 2018-10-19 ENCOUNTER — Emergency Department (HOSPITAL_COMMUNITY)
Admission: EM | Admit: 2018-10-19 | Discharge: 2018-10-19 | Disposition: A | Discharge status: Home / Self Care | Payer: Medicare Other | Attending: Emergency Medicine | Admitting: Emergency Medicine

## 2018-10-19 ENCOUNTER — Encounter (HOSPITAL_COMMUNITY): Payer: Self-pay

## 2018-10-19 DIAGNOSIS — L02211 Cutaneous abscess of abdominal wall: Secondary | ICD-10-CM | POA: Diagnosis present

## 2018-10-19 DIAGNOSIS — Z91040 Latex allergy status: Secondary | ICD-10-CM | POA: Diagnosis not present

## 2018-10-19 DIAGNOSIS — Z794 Long term (current) use of insulin: Secondary | ICD-10-CM | POA: Insufficient documentation

## 2018-10-19 DIAGNOSIS — J45909 Unspecified asthma, uncomplicated: Secondary | ICD-10-CM | POA: Insufficient documentation

## 2018-10-19 DIAGNOSIS — Z79899 Other long term (current) drug therapy: Secondary | ICD-10-CM | POA: Diagnosis not present

## 2018-10-19 DIAGNOSIS — E1165 Type 2 diabetes mellitus with hyperglycemia: Secondary | ICD-10-CM | POA: Insufficient documentation

## 2018-10-19 DIAGNOSIS — L0291 Cutaneous abscess, unspecified: Secondary | ICD-10-CM

## 2018-10-19 DIAGNOSIS — R739 Hyperglycemia, unspecified: Secondary | ICD-10-CM

## 2018-10-19 LAB — POCT I-STAT EG7
Acid-base deficit: 1 mmol/L (ref 0.0–2.0)
Bicarbonate: 23 mmol/L (ref 20.0–28.0)
Calcium, Ion: 1.22 mmol/L (ref 1.15–1.40)
HCT: 36 % (ref 36.0–46.0)
Hemoglobin: 12.2 g/dL (ref 12.0–15.0)
O2 Saturation: 91 %
Potassium: 3.9 mmol/L (ref 3.5–5.1)
Sodium: 132 mmol/L — ABNORMAL LOW (ref 135–145)
TCO2: 24 mmol/L (ref 22–32)
pCO2, Ven: 36.8 mmHg — ABNORMAL LOW (ref 44.0–60.0)
pH, Ven: 7.403 (ref 7.250–7.430)
pO2, Ven: 60 mmHg — ABNORMAL HIGH (ref 32.0–45.0)

## 2018-10-19 LAB — CBG MONITORING, ED: Glucose-Capillary: 346 mg/dL — ABNORMAL HIGH (ref 70–99)

## 2018-10-19 LAB — URINALYSIS, ROUTINE W REFLEX MICROSCOPIC
Bacteria, UA: NONE SEEN
Bilirubin Urine: NEGATIVE
Glucose, UA: >=500 mg/dL — AB
Hgb urine dipstick: NEGATIVE
Ketones, ur: 5 mg/dL — AB
Leukocytes,Ua: NEGATIVE
Nitrite: NEGATIVE
Protein, ur: NEGATIVE mg/dL
Specific Gravity, Urine: 1.035 — ABNORMAL HIGH (ref 1.005–1.030)
pH: 6 (ref 5.0–8.0)

## 2018-10-19 MED ORDER — ACETAMINOPHEN 325 MG PO TABS
650.0000 mg | ORAL_TABLET | Freq: Once | ORAL | Status: AC
  Administered 2018-10-19: 650 mg via ORAL
  Filled 2018-10-19: qty 2

## 2018-10-19 MED ORDER — LIDOCAINE HCL 2 % IJ SOLN
10.0000 mL | Freq: Once | INTRAMUSCULAR | Status: AC
  Administered 2018-10-19: 200 mg via INTRADERMAL
  Filled 2018-10-19: qty 20

## 2018-10-19 MED ORDER — DOXYCYCLINE HYCLATE 100 MG PO CAPS: 100.0000 mg | ORAL_CAPSULE | Freq: Two times a day (BID) | ORAL | 0 refills | Status: AC

## 2018-10-19 NOTE — TOC Transition Note (Addendum)
Transition of Care [facility]) - CM/SW Discharge Note   Patient Details  Name: Evelyn Watts MRN: [id number] Date of Birth: Oct 12, 1975  [facility]) CM/SW Contact:  Erenest Rasher, RN Phone Number: [phone] 10/19/2018, 1:59 PM   Clinical Narrative:   Referral- PCP/2 ED visits in June, 2020   Arranged follow up appt with pt's PCP on 11/01/2018 at 2:30 pm. Will provide info to patient.

## 2018-10-19 NOTE — Discharge Instructions (Signed)
You were given a prescription for antibiotics. Please take the antibiotic prescription fully.   You were also noted to have elevated blood sugars today, you need to follow-up with your regular doctor in regards to possibly titrating your diabetes medications to help lower your blood sugars.  Please follow up with your primary care provider within 5-7 days for re-evaluation of your symptoms. If you do not have a primary care provider, information for a healthcare clinic has been provided for you to make arrangements for follow up care.   Please return to the emergency room immediately if you experience any new or worsening symptoms or any symptoms that indicate worsening infection such as fevers, increased redness/swelling/pain, warmth, or drainage from the affected area.

## 2018-10-19 NOTE — ED Provider Notes (Signed)
[place] DEPT Provider Note   CSN: [id number] Arrival date & time: 10/19/18  1317    History   Chief Complaint Chief Complaint  Patient presents with  . Abscess    HPI Evelyn Watts is a 43 y.o. female.     HPI  Pt is a 43 y/o female with a h/o asthma, DM, who presents to the ED today for eval of possible skin infection that has been present for the last 4 weeks. States pain 10/10. Denies exacerbating or alleviating factors.  This patient was seen by myself about a week ago for similar complaint.  At that time she had some induration but did not have any fluctuance on exam or drainable abscess.  I called her in a prescription for Keflex which she states she did not fill.  She filled the prescription from her home health nurse for an antibiotic but she is unsure what the name is.  She is been on that for about a week.  It has not improved her symptoms.  She feels like her symptoms are worse. I am unable to contact the patient's home health nurse in order to confirm the antibiotic.  Denies fevers.   Past Medical History:  Diagnosis Date  . Asthma   . Diabetes mellitus without complication [facility])     Patient Active Problem List   Diagnosis Date Noted  . Type 2 diabetes mellitus with hyperglycemia, with long-term current use of insulin ([place]) 02/14/2018  . Diabetes mellitus without complication ([place]) 02/58/5277  . Moderate episode of recurrent major depressive disorder ([place]) 05/24/2016  . Morbid obesity ([place]) 05/24/2016    Past Surgical History:  Procedure Laterality Date  . CESAREAN SECTION     4  . CHOLECYSTECTOMY    . HERNIA REPAIR    . TONSILLECTOMY       OB History   No obstetric history on file.     Home Medications    Prior to Admission medications   Medication Sig Start Date End Date Taking? Authorizing Provider  atorvastatin (LIPITOR) 20 MG tablet Take 1 tablet (20 mg total) by mouth daily. 02/14/18   Gildardo Pounds, NP   blood glucose meter kit and supplies KIT Dispense based on patient and insurance preference. Use up to four times daily as directed. (FOR ICD-9 250.00, 250.01). 10/10/18   Sederick Jacobsen S, PA-C  doxycycline (VIBRAMYCIN) 100 MG capsule Take 1 capsule (100 mg total) by mouth 2 (two) times daily for 7 days. 10/19/18 10/26/18  Moncerrath Berhe S, PA-C  glucose blood test strip Use as instructed. Check blood glucose levels by fingerstick twice per day 02/14/18   Gildardo Pounds, NP  insulin glargine (LANTUS) 100 UNIT/ML injection Inject 0.3 mLs (30 Units total) into the skin 2 (two) times daily. 02/14/18   Gildardo Pounds, NP  liraglutide (VICTOZA) 18 MG/3ML SOPN Inject 0.3 mLs (1.8 mg total) into the skin daily. 02/14/18 03/16/18  Gildardo Pounds, NP  lisinopril (PRINIVIL,ZESTRIL) 20 MG tablet Take 1 tablet (20 mg total) by mouth daily. 02/14/18   Gildardo Pounds, NP  meloxicam (MOBIC) 15 MG tablet Take 1 tablet (15 mg total) by mouth daily. 03/13/18   Landis Martins, DPM  meloxicam (MOBIC) 15 MG tablet Take 1 tablet (15 mg total) by mouth daily. 03/13/18   Landis Martins, DPM  metFORMIN (GLUCOPHAGE) 500 MG tablet Take 2 tablets (1,000 mg total) by mouth 2 (two) times daily with a meal. MUST MAKE APPT FOR FURTHER  REFILLS 05/18/18   Gildardo Pounds, NP  Misc. Devices MISC Please provide patient with glucerna for nutritional support. 02/14/18   Gildardo Pounds, NP  naproxen (NAPROSYN) 375 MG tablet Take 1 tablet (375 mg total) by mouth 2 (two) times daily. 03/19/18   Wurst, [place], PA-C  VENTOLIN HFA 108 (90 Base) MCG/ACT inhaler INHALE ONE TO TWO PUFFS INTO THE LUNGS EVERY 6 HOURS AS NEEDED FOR WHEEZING OR SHORTNESS OF BREATH 04/13/18   Gildardo Pounds, NP    Family History Family History  Problem Relation Age of Onset  . Hypertension Neg Hx   . Diabetes Neg Hx     Social History Social History   Tobacco Use  . Smoking status: Never Smoker  . Smokeless tobacco: Never Used  Substance  Use Topics  . Alcohol use: No  . Drug use: No     Allergies   Latex, Morphine and related, and Tape   Review of Systems Review of Systems  Constitutional: Negative for fever.  HENT: Negative for ear pain and sore throat.   Eyes: Negative for visual disturbance.  Respiratory: Negative for shortness of breath.   Cardiovascular: Negative for chest pain.  Gastrointestinal: Negative for abdominal pain, nausea and vomiting.  Genitourinary: Negative for dysuria and hematuria.  Musculoskeletal: Negative for back pain.  Skin: Positive for color change.       Possible abscess  Neurological: Negative for headaches.  All other systems reviewed and are negative.    Physical Exam Updated Vital Signs BP 133/87 (BP Location: Left Arm)   Pulse (!) 101   Temp 98.3 F (36.8 C) (Oral)   Resp 20   Ht _0  (1.803 m)   Wt 122.5 kg   SpO2 100%   BMI 37.66 kg/m   Physical Exam Vitals signs and nursing note reviewed.  Constitutional:      General: She is not in acute distress.    Appearance: She is well-developed.  HENT:     Head: Normocephalic and atraumatic.  Eyes:     Conjunctiva/sclera: Conjunctivae normal.  Neck:     Musculoskeletal: Neck supple.  Cardiovascular:     Rate and Rhythm: Normal rate and regular rhythm.     Heart sounds: No murmur.  Pulmonary:     Effort: Pulmonary effort is normal. No respiratory distress.     Breath sounds: Normal breath sounds.  Abdominal:     Palpations: Abdomen is soft.     Tenderness: There is no abdominal tenderness.  Musculoskeletal:     Comments: 4x4cm area of induration to the right inguinal area with a larger surrounding are of erythema with a central area of fluctuance.   Skin:    General: Skin is warm and dry.  Neurological:     Mental Status: She is alert.      ED Treatments / Results  Labs (all labs ordered are listed, but only abnormal results are displayed) Labs Reviewed  URINALYSIS, ROUTINE W REFLEX MICROSCOPIC -  Abnormal; Notable for the following components:      Result Value   Color, Urine STRAW (*)    Specific Gravity, Urine 1.035 (*)    Glucose, UA >=500 (*)    Ketones, ur 5 (*)    All other components within normal limits  CBG MONITORING, ED - Abnormal; Notable for the following components:   Glucose-Capillary 346 (*)    All other components within normal limits  POCT I-STAT EG7 - Abnormal; Notable for the following components:  pCO2, Ven 36.8 (*)    pO2, Ven 60.0 (*)    Sodium 132 (*)    All other components within normal limits    EKG None  Radiology No results found.  Procedures .Marland KitchenIncision and Drainage  Date/Time: 10/19/2018 6:01 PM Performed by: Rodney Booze, PA-C Authorized by: Rodney Booze, PA-C   Consent:    Consent obtained:  Verbal   Consent given by:  Patient   Risks discussed:  Bleeding, incomplete drainage, pain and damage to other organs   Alternatives discussed:  No treatment Universal protocol:    Procedure explained and questions answered to patient or proxy's satisfaction: yes     Relevant documents present and verified: yes     Test results available and properly labeled: yes     Imaging studies available: yes     Required blood products, implants, devices, and special equipment available: yes     Site/side marked: yes     Immediately prior to procedure a time out was called: yes     Patient identity confirmed:  Verbally with patient Location:    Type:  Abscess Pre-procedure details:    Skin preparation:  Betadine Anesthesia (see MAR for exact dosages):    Anesthesia method:  Local infiltration   Local anesthetic:  Lidocaine 1% WITH epi Procedure type:    Complexity:  Complex Procedure details:    Incision types:  Single straight   Incision depth:  Subcutaneous   Scalpel blade:  11   Wound management:  Probed and deloculated and extensive cleaning   Drainage:  Purulent   Drainage amount:  Copious Post-procedure details:    Patient  tolerance of procedure:  Tolerated well, no immediate complications   (including critical care time)  Medications Ordered in ED Medications  lidocaine (XYLOCAINE) 2 % (with pres) injection 200 mg (200 mg Intradermal Given 10/19/18 1539)  acetaminophen (TYLENOL) tablet 650 mg (650 mg Oral Given 10/19/18 1724)     Initial Impression / Assessment and Plan / ED Course  I have reviewed the triage vital signs and the nursing notes.  Pertinent labs & imaging results that were available during my care of the patient were reviewed by me and considered in my medical decision making (see chart for details).     Final Clinical Impressions(s) / ED Diagnoses   Final diagnoses:  Abscess  Hyperglycemia    Patient with skin abscess amenable to incision and drainage.  Abscess was not large enough to warrant packing or drain. Encouraged home warm soaks and flushing.  Mild signs of cellulitis is surrounding skin.  Will d/c to home with doxycycline.  CBG in the 350 range in the ED.  Patient not having any signs or symptoms to suggest DKA.  She also has a normal bicarb and normal pH therefore DKA is unlikely.  She has minimal ketones in the urine.  I advised that she needs to follow-up with her regular doctor in regards to titrating her diabetes medications.  She is nontoxic and nonseptic appearing.  I do not think that she requires admission.  Patient voiced understanding the plan agrees to return.  All questions answered.  Patient stable for discharge.   ED Discharge Orders         Ordered    doxycycline (VIBRAMYCIN) 100 MG capsule  2 times daily     10/19/18 1759           Bodie Abernethy S, PA-C 10/19/18 1801    [place], Grayce Sessions,  MD 10/22/18 [phone]

## 2018-10-19 NOTE — ED Triage Notes (Signed)
Pt reports that she has a abcess to her rt upper inner thigh/groin area. Pt states that she had it for 3 week and was on ABX but seems to be making the area worse. Pt reports 10/10, area is red and is firm to touch, no breat in the skin to report.

## 2018-10-19 NOTE — ED Notes (Signed)
Wound care completed.

## 2018-11-01 ENCOUNTER — Inpatient Hospital Stay: Payer: Medicare Other | Admitting: Family Medicine

## 2018-11-10 IMAGING — DX DG CHEST 2V
2 series · 2 of 2 positions shown · non-contrast
Comparison: Chest x-ray 09/18/2016.

CLINICAL DATA: 41-year-old female with central chest pain radiating
posteriorly and minor shortness of breath for the past 2 days after
a fall.

EXAM:
CHEST  2 VIEW

[chest pa]
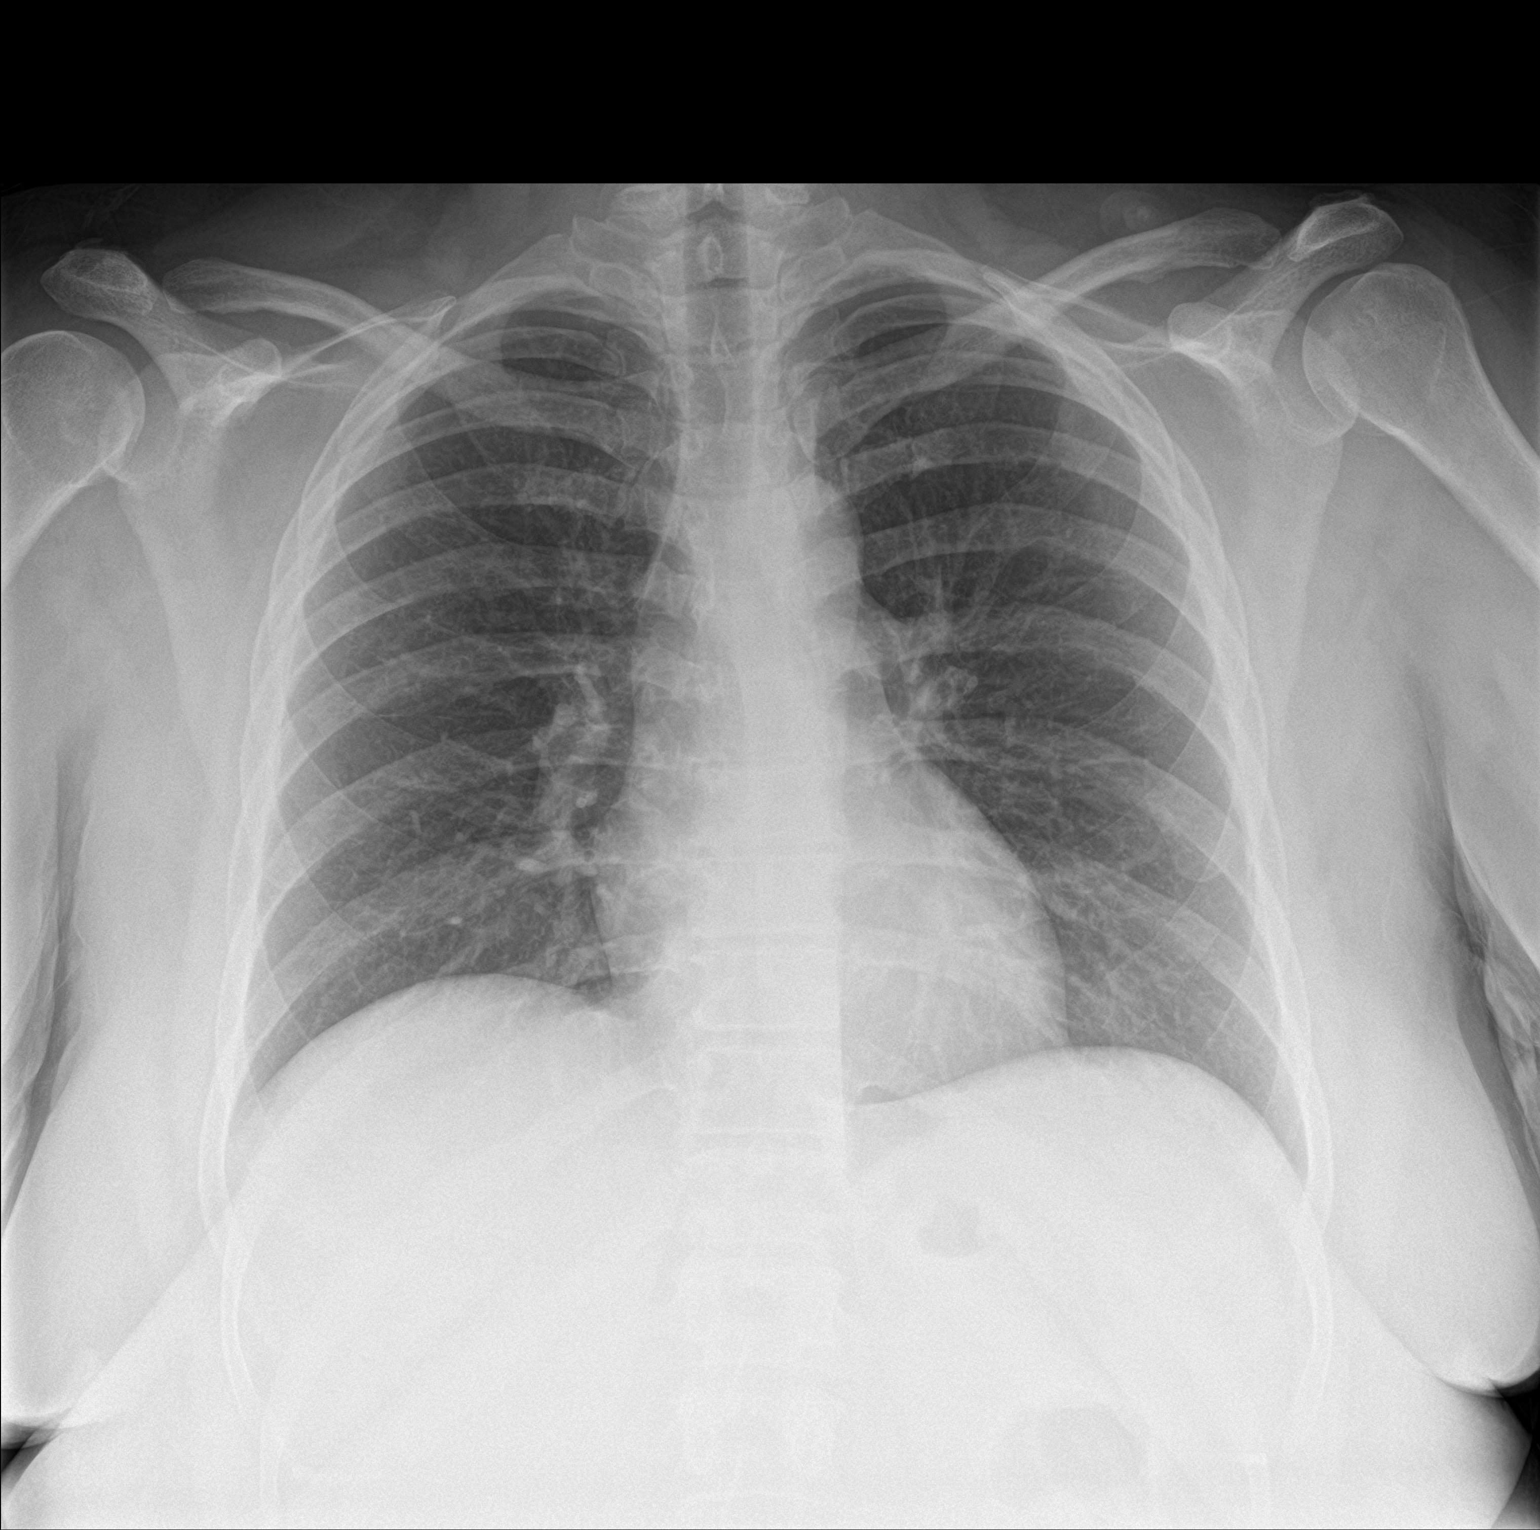

[chest lat]
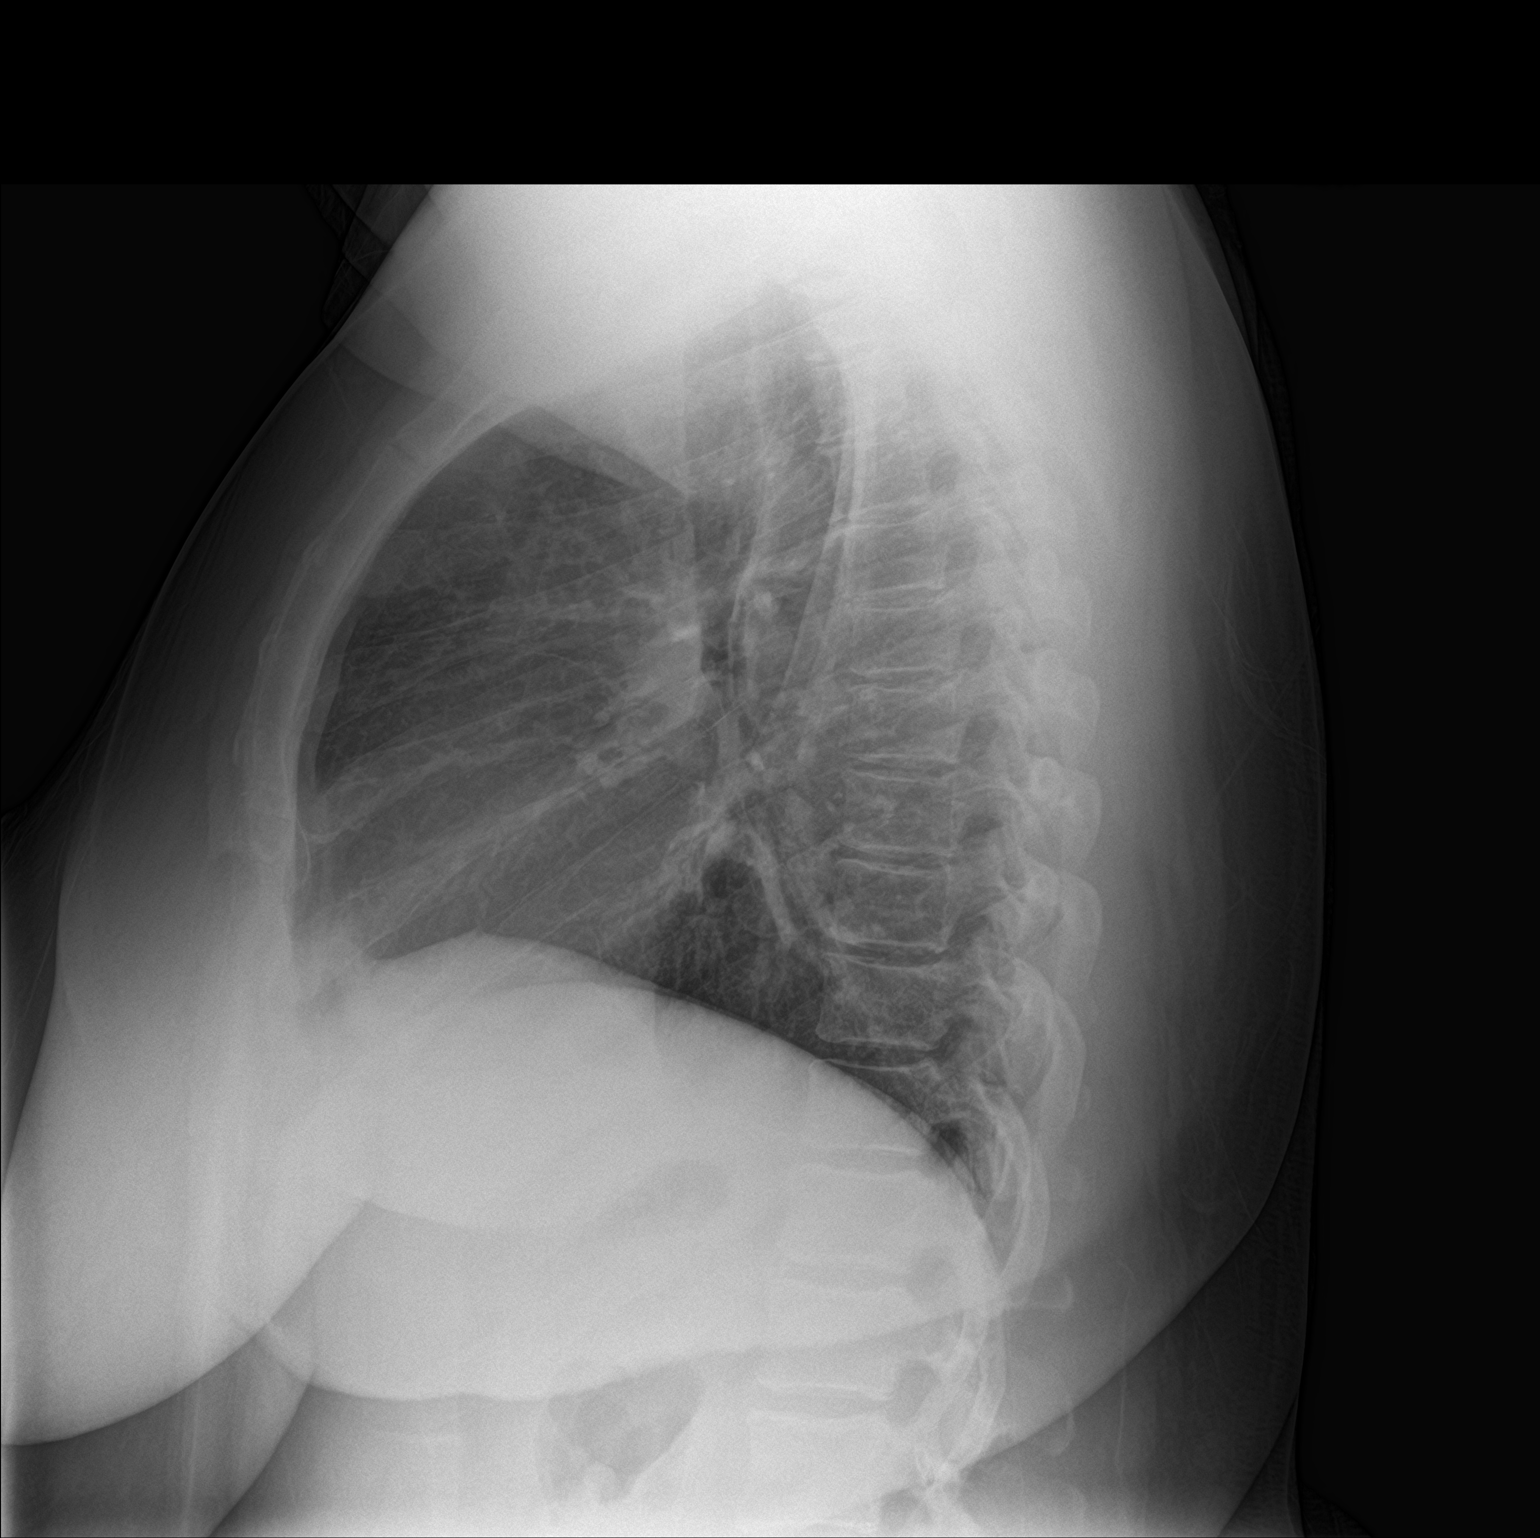

[2 of 2 positions shown; findings below may reference images not displayed]

FINDINGS: Lung volumes are normal. No consolidative airspace disease. No
pleural effusions. No pneumothorax. No pulmonary nodule or mass
noted. Pulmonary vasculature and the cardiomediastinal silhouette
are within normal limits. Bony thorax appears grossly intact.
IMPRESSION: No radiographic evidence of acute cardiopulmonary disease.

## 2018-11-13 IMAGING — DX DG FOREARM 2V*R*
2 series · 2 of 2 positions shown · non-contrast
Comparison: None.

CLINICAL DATA: Fall 3 days ago with right forearm and hand pain.
Initial encounter.

EXAM:
RIGHT FOREARM - 2 VIEW

[forearm ap]
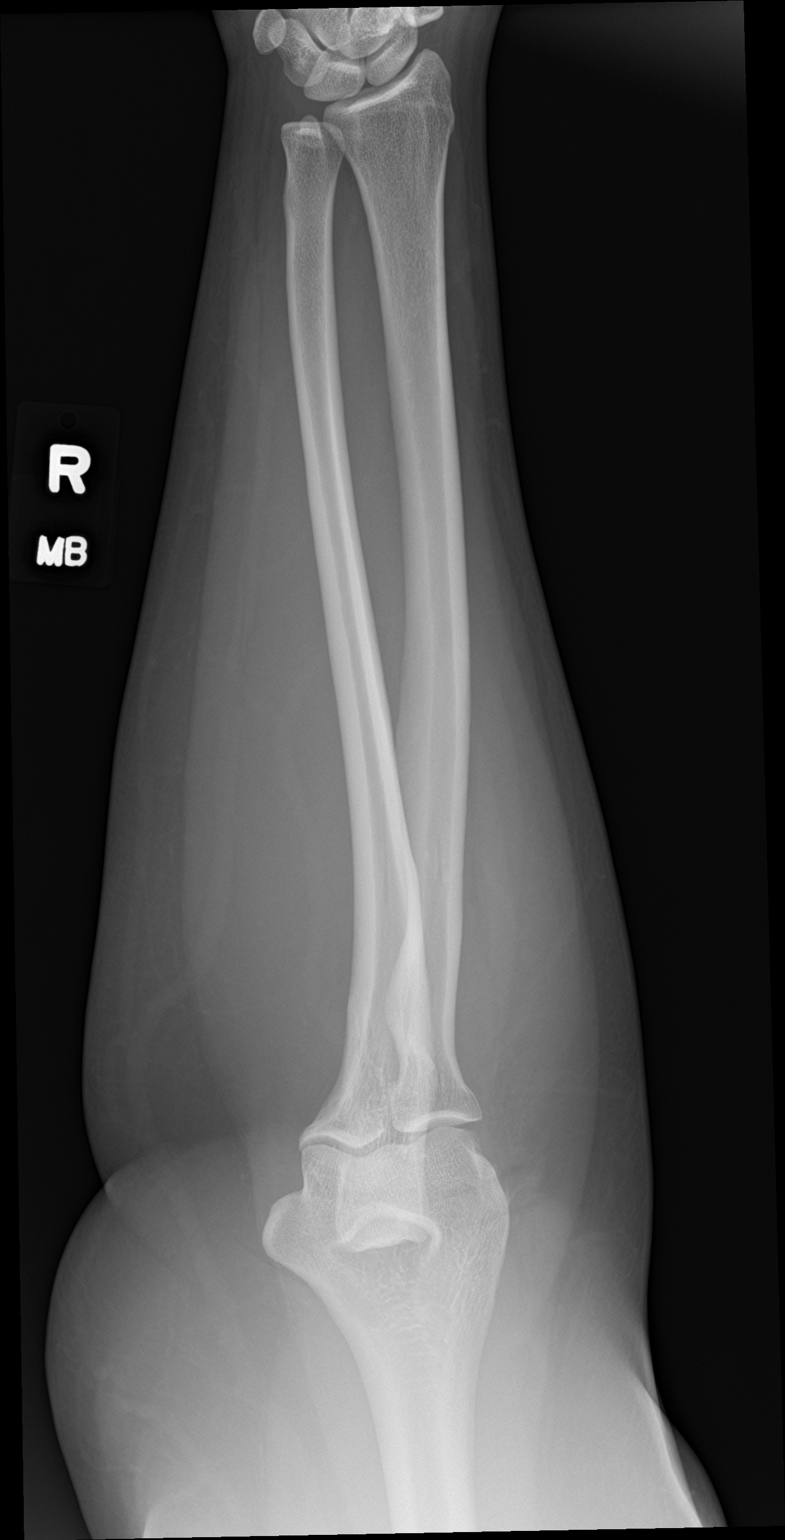

[forearm lat]
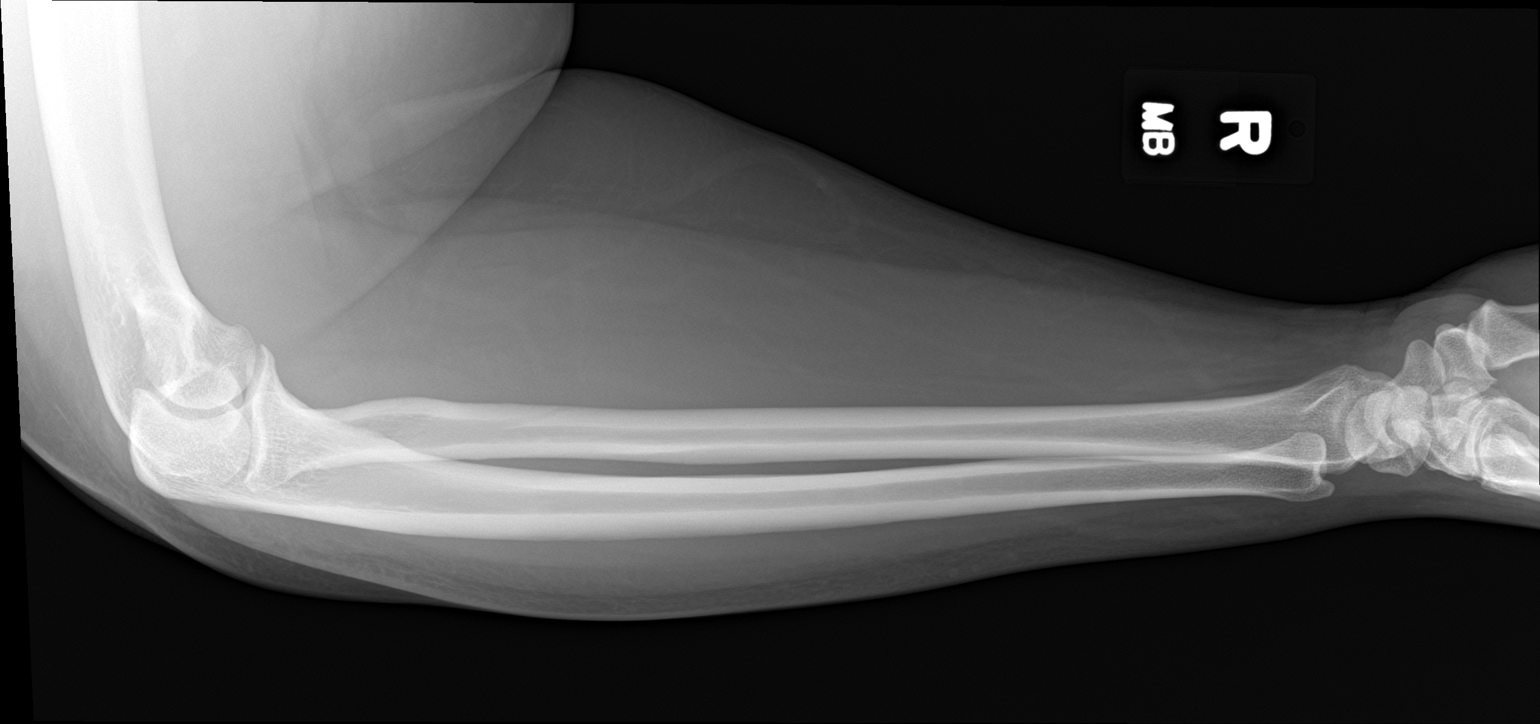

[2 of 2 positions shown; findings below may reference images not displayed]

FINDINGS: There is no evidence of fracture or other focal bone lesions. Soft
tissues are unremarkable.
IMPRESSION: Negative.

## 2019-01-31 ENCOUNTER — Other Ambulatory Visit: Payer: Self-pay | Admitting: Nurse Practitioner

## 2019-01-31 DIAGNOSIS — Z794 Long term (current) use of insulin: Secondary | ICD-10-CM

## 2019-01-31 DIAGNOSIS — E1165 Type 2 diabetes mellitus with hyperglycemia: Secondary | ICD-10-CM

## 2019-02-09 ENCOUNTER — Encounter (HOSPITAL_COMMUNITY): Payer: Self-pay | Admitting: Emergency Medicine

## 2019-02-09 ENCOUNTER — Emergency Department (HOSPITAL_COMMUNITY)
Admission: EM | Admit: 2019-02-09 | Discharge: 2019-02-09 | Disposition: A | Discharge status: Home / Self Care | Payer: Medicare Other | Attending: Emergency Medicine | Admitting: Emergency Medicine

## 2019-02-09 ENCOUNTER — Other Ambulatory Visit: Payer: Self-pay

## 2019-02-09 DIAGNOSIS — J45909 Unspecified asthma, uncomplicated: Secondary | ICD-10-CM | POA: Diagnosis not present

## 2019-02-09 DIAGNOSIS — Z91040 Latex allergy status: Secondary | ICD-10-CM | POA: Diagnosis not present

## 2019-02-09 DIAGNOSIS — Z79899 Other long term (current) drug therapy: Secondary | ICD-10-CM | POA: Diagnosis not present

## 2019-02-09 DIAGNOSIS — H53149 Visual discomfort, unspecified: Secondary | ICD-10-CM

## 2019-02-09 DIAGNOSIS — H5712 Ocular pain, left eye: Secondary | ICD-10-CM | POA: Diagnosis present

## 2019-02-09 DIAGNOSIS — E119 Type 2 diabetes mellitus without complications: Secondary | ICD-10-CM | POA: Diagnosis not present

## 2019-02-09 DIAGNOSIS — H1032 Unspecified acute conjunctivitis, left eye: Secondary | ICD-10-CM | POA: Diagnosis not present

## 2019-02-09 DIAGNOSIS — Z794 Long term (current) use of insulin: Secondary | ICD-10-CM | POA: Insufficient documentation

## 2019-02-09 MED ORDER — ERYTHROMYCIN 5 MG/GM OP OINT: 1.0000 "application " | TOPICAL_OINTMENT | Freq: Four times a day (QID) | OPHTHALMIC | 1 refills | Status: DC

## 2019-02-09 MED ORDER — FLUORESCEIN SODIUM 1 MG OP STRP
1.0000 | ORAL_STRIP | Freq: Once | OPHTHALMIC | Status: AC
  Administered 2019-02-09: 21:00:00 1 via OPHTHALMIC
  Filled 2019-02-09: qty 1

## 2019-02-09 MED ORDER — TETRACAINE HCL 0.5 % OP SOLN
2.0000 [drp] | Freq: Once | OPHTHALMIC | Status: AC
  Administered 2019-02-09: 2 [drp] via OPHTHALMIC
  Filled 2019-02-09: qty 4

## 2019-02-09 NOTE — ED Provider Notes (Signed)
Evelyn Watts Provider Note   CSN: [id number] Arrival date & time: 02/09/19  1803     History   Chief Complaint Chief Complaint  Patient presents with  . Eye Pain    HPI Evelyn Watts is a 43 y.o. female.  With a past medical history of obesity, asthma and diabetes who presents emergency department chief complaint of left eye pain.  Patient states that 2 days ago she noticed that her left eye was very sensitive to light.  She had progressively worsening photosensitivity and pain in the left eye and now complains of severe pain whenever light is exposed to the left eye.  She has no known injuries.  She is never had anything like this before.  She does not wear contact lenses.  She denies pain with consensual light exposure.  She has been wearing a homemade patch over the eye.  She denies any changes in vision, severe headache, nausea or vomiting.     HPI  Past Medical History:  Diagnosis Date  . Asthma   . Diabetes mellitus without complication [facility])     Patient Active Problem List   Diagnosis Date Noted  . Type 2 diabetes mellitus with hyperglycemia, with long-term current use of insulin ([place]) 02/14/2018  . Diabetes mellitus without complication ([place]) 18/29/9371  . Moderate episode of recurrent major depressive disorder ([place]) 05/24/2016  . Morbid obesity ([place]) 05/24/2016    Past Surgical History:  Procedure Laterality Date  . CESAREAN SECTION     4  . CHOLECYSTECTOMY    . HERNIA REPAIR    . TONSILLECTOMY       OB History   No obstetric history on file.      Home Medications    Prior to Admission medications   Medication Sig Start Date End Date Taking? Authorizing Provider  atorvastatin (LIPITOR) 20 MG tablet Take 1 tablet (20 mg total) by mouth daily. 02/14/18   Gildardo Pounds, NP  blood glucose meter kit and supplies KIT Dispense based on patient and insurance preference. Use up to four times daily as directed. (FOR ICD-9  250.00, 250.01). 10/10/18   Couture, Cortni S, PA-C  glucose blood test strip Use as instructed. Check blood glucose levels by fingerstick twice per day 02/14/18   Gildardo Pounds, NP  insulin glargine (LANTUS) 100 UNIT/ML injection Inject 0.3 mLs (30 Units total) into the skin 2 (two) times daily. 02/14/18   Gildardo Pounds, NP  liraglutide (VICTOZA) 18 MG/3ML SOPN Inject 0.3 mLs (1.8 mg total) into the skin daily. 02/14/18 03/16/18  Gildardo Pounds, NP  lisinopril (PRINIVIL,ZESTRIL) 20 MG tablet Take 1 tablet (20 mg total) by mouth daily. 02/14/18   Gildardo Pounds, NP  meloxicam (MOBIC) 15 MG tablet Take 1 tablet (15 mg total) by mouth daily. 03/13/18   Landis Martins, DPM  meloxicam (MOBIC) 15 MG tablet Take 1 tablet (15 mg total) by mouth daily. 03/13/18   Landis Martins, DPM  metFORMIN (GLUCOPHAGE) 500 MG tablet Take 2 tablets (1,000 mg total) by mouth 2 (two) times daily with a meal. MUST MAKE APPT FOR FURTHER REFILLS 05/18/18   Gildardo Pounds, NP  Misc. Devices MISC Please provide patient with glucerna for nutritional support. 02/14/18   Gildardo Pounds, NP  naproxen (NAPROSYN) 375 MG tablet Take 1 tablet (375 mg total) by mouth 2 (two) times daily. 03/19/18   Wurst, Marye Round, PA-C  VENTOLIN HFA 108 (90 Base) MCG/ACT inhaler INHALE ONE TO TWO  PUFFS INTO THE LUNGS EVERY 6 HOURS AS NEEDED FOR WHEEZING OR SHORTNESS OF BREATH 04/13/18   Gildardo Pounds, NP    Family History Family History  Problem Relation Age of Onset  . Hypertension Neg Hx   . Diabetes Neg Hx     Social History Social History   Tobacco Use  . Smoking status: Never Smoker  . Smokeless tobacco: Never Used  Substance Use Topics  . Alcohol use: No  . Drug use: No     Allergies   Latex, Morphine and related, and Tape   Review of Systems Review of Systems Ten systems reviewed and are negative for acute change, except as noted in the HPI.    Physical Exam Updated Vital Signs BP (!) 167/108 (BP  Location: Right Arm)   Pulse 90   Temp 98.1 F (36.7 C) (Oral)   Resp 18   SpO2 100%   Physical Exam Vitals signs and nursing note reviewed.  Constitutional:      General: She is not in acute distress.    Appearance: She is well-developed. She is not diaphoretic.  HENT:     Head: Normocephalic and atraumatic.  Eyes:     General: Lids are normal. Lids are everted, no foreign bodies appreciated. Vision grossly intact. No scleral icterus.    Intraocular pressure: Right eye pressure is 20 mmHg. Left eye pressure is 18 mmHg. Measurements were taken using a handheld tonometer.    Extraocular Movements: Extraocular movements intact.     Conjunctiva/sclera:     Right eye: Right conjunctiva is not injected. No chemosis, exudate or hemorrhage.    Left eye: Left conjunctiva is injected.     Pupils: Pupils are equal, round, and reactive to light.     Right eye: No corneal abrasion or fluorescein uptake. Seidel exam negative.     Left eye: No corneal abrasion or fluorescein uptake. Seidel exam negative.    Slit lamp exam:    Right eye: No foreign body or photophobia.     Left eye: Photophobia present. No foreign body.  Neck:     Musculoskeletal: Normal range of motion.  Cardiovascular:     Rate and Rhythm: Normal rate and regular rhythm.     Heart sounds: Normal heart sounds. No murmur. No friction rub. No gallop.   Pulmonary:     Effort: Pulmonary effort is normal. No respiratory distress.     Breath sounds: Normal breath sounds.  Abdominal:     General: Bowel sounds are normal. There is no distension.     Palpations: Abdomen is soft. There is no mass.     Tenderness: There is no abdominal tenderness. There is no guarding.  Skin:    General: Skin is warm and dry.  Neurological:     Mental Status: She is alert and oriented to person, place, and time.  Psychiatric:        Behavior: Behavior normal.      ED Treatments / Results  Labs (all labs ordered are listed, but only abnormal  results are displayed) Labs Reviewed - No data to display  EKG None  Radiology No results found.  Procedures Procedures (including critical care time)  Medications Ordered in ED Medications  fluorescein ophthalmic strip 1 strip (has no administration in time range)  tetracaine (PONTOCAINE) 0.5 % ophthalmic solution 2 drop (has no administration in time range)     Initial Impression / Assessment and Plan / ED Course  I have reviewed the triage  vital signs and the nursing notes.  Pertinent labs & imaging results that were available during my care of the patient were reviewed by me and considered in my medical decision making (see chart for details).       43 year old female here with left eye erythema, tearing and photophobia.  Her work-up is most consistent with conjunctivitis.  She has no consensual light photophobia so I have low suspicion for iritis/uveitis. Similarly she does not appear to have any corneal ulceration or erosions. She has normal pressures and I have no concern for acute angle-closure glaucoma.  No evidence of dendritic staining suggestive of herpes zoster ophthalmicus.  She has normal vision grossly.  No surrounding tissue erythema suggestive of blepharitis.  Patient will be discharged with topical erythromycin ointment.  She is encouraged to follow very closely with ophthalmology.  I have discussed return precautions.  She appears appropriate for discharge at this time.   Final Clinical Impressions(s) / ED Diagnoses   Final diagnoses:  None    ED Discharge Orders    None       Margarita Mail, PA-C 02/09/19 2259    Julianne Rice, MD 02/09/19 2317

## 2019-02-09 NOTE — Discharge Instructions (Signed)
Get help right away if you have: A fever and your symptoms suddenly get worse. Severe pain when you move your eye. Facial pain, redness, or swelling. Sudden loss of vision.

## 2019-02-09 NOTE — ED Triage Notes (Signed)
Patient here from home with complaints of left eye pain that started yesturday. No injury or trauma. Pupils reactive.

## 2019-03-04 ENCOUNTER — Other Ambulatory Visit: Payer: Self-pay

## 2019-03-04 ENCOUNTER — Emergency Department (HOSPITAL_COMMUNITY)
Admission: EM | Admit: 2019-03-04 | Discharge: 2019-03-04 | Disposition: A | Discharge status: Home / Self Care | Payer: Medicare Other | Attending: Emergency Medicine | Admitting: Emergency Medicine

## 2019-03-04 ENCOUNTER — Emergency Department (HOSPITAL_COMMUNITY): Payer: Medicare Other

## 2019-03-04 DIAGNOSIS — Z794 Long term (current) use of insulin: Secondary | ICD-10-CM | POA: Insufficient documentation

## 2019-03-04 DIAGNOSIS — Z77120 Contact with and (suspected) exposure to mold (toxic): Secondary | ICD-10-CM

## 2019-03-04 DIAGNOSIS — E119 Type 2 diabetes mellitus without complications: Secondary | ICD-10-CM | POA: Insufficient documentation

## 2019-03-04 DIAGNOSIS — J45909 Unspecified asthma, uncomplicated: Secondary | ICD-10-CM | POA: Diagnosis not present

## 2019-03-04 DIAGNOSIS — R519 Headache, unspecified: Secondary | ICD-10-CM | POA: Insufficient documentation

## 2019-03-04 DIAGNOSIS — Z79899 Other long term (current) drug therapy: Secondary | ICD-10-CM | POA: Insufficient documentation

## 2019-03-04 LAB — BASIC METABOLIC PANEL
Anion gap: 7 (ref 5–15)
BUN: 13 mg/dL (ref 6–20)
CO2: 24 mmol/L (ref 22–32)
Calcium: 8.4 mg/dL — ABNORMAL LOW (ref 8.9–10.3)
Chloride: 102 mmol/L (ref 98–111)
Creatinine, Ser: 0.6 mg/dL (ref 0.44–1.00)
GFR calc Af Amer: >60 mL/min (ref >60)
GFR calc non Af Amer: >60 mL/min (ref >60)
Glucose, Bld: 368 mg/dL — ABNORMAL HIGH (ref 70–99)
Potassium: 4 mmol/L (ref 3.5–5.1)
Sodium: 133 mmol/L — ABNORMAL LOW (ref 135–145)

## 2019-03-04 MED ORDER — INSULIN ASPART 100 UNIT/ML ~~LOC~~ SOLN
5.0000 [IU] | Freq: Once | SUBCUTANEOUS | Status: AC
  Administered 2019-03-04: 5 [IU] via INTRAVENOUS
  Filled 2019-03-04: qty 0.05

## 2019-03-04 NOTE — Discharge Instructions (Addendum)
You are seen in the ER for headaches.  It is unclear why you are having headaches.  Please read the instructions on idiopathic intracranial hypertension because that is a possible cause for your headaches.  If you start having vision change, new numbness, slurring of the speech or new weakness please return to the ER immediately.  Consider seeing neurologist if you start noticing other patterns consistent with this headache to get a better diagnosis.  Take Tylenol or ibuprofen for pain control.  If you suspect mold is the cause for the headache then you will need to ensure that the mold is cleared from your house.  You may return to the work tomorrow.  Please notify your landlord that prolonged mold exposure in your house is not ideal for anybody's health, and they should clear up from your house soon as possible.

## 2019-03-04 NOTE — ED Notes (Signed)
Pt refused discharge vital signs

## 2019-03-04 NOTE — ED Provider Notes (Addendum)
[facility] DEPT Provider Note   CSN: [id number] Arrival date & time: 03/04/19  0930     History   Chief Complaint No chief complaint on file.   HPI Evelyn Watts is a 43 y.o. female.     HPI 43 year old female comes in a chief complaint of headaches. Patient was given 1000 mg of Tylenol by EMS and currently is headache free.  She reports that for the past 2 weeks she has been waking up with headaches.  Headaches are frontal and described as dull pain.  She takes ibuprofen and the pain resolves, but it is always back when she wakes up.  There is no specific evoking factor and patient denies any photophobia, phonophobia, positional component of the headache. + Nausea  Patient does indicate that they have noticed black mold in the house and she thinks that that is the reason for her to have headaches.  Pt has no associated vomiting, seizures, loss of consciousness or new visual complains, weakness, numbness, dizziness or gait instability.  There is no family history of brain aneurysm, brain bleed, brain tumor.    Past Medical History:  Diagnosis Date  . Asthma   . Diabetes mellitus without complication [facility])     Patient Active Problem List   Diagnosis Date Noted  . Type 2 diabetes mellitus with hyperglycemia, with long-term current use of insulin ([place]) 02/14/2018  . Diabetes mellitus without complication ([place]) 02/16/8526  . Moderate episode of recurrent major depressive disorder ([place]) 05/24/2016  . Morbid obesity ([place]) 05/24/2016    Past Surgical History:  Procedure Laterality Date  . CESAREAN SECTION     4  . CHOLECYSTECTOMY    . HERNIA REPAIR    . TONSILLECTOMY       OB History   No obstetric history on file.      Home Medications    Prior to Admission medications   Medication Sig Start Date End Date Taking? Authorizing Provider  atorvastatin (LIPITOR) 20 MG tablet Take 1 tablet (20 mg total) by mouth daily. Patient not  taking: Reported on 02/09/2019 02/14/18   Gildardo Pounds, NP  blood glucose meter kit and supplies KIT Dispense based on patient and insurance preference. Use up to four times daily as directed. (FOR ICD-9 250.00, 250.01). 10/10/18   Couture, Cortni S, PA-C  erythromycin ophthalmic ointment Place 1 application into the left eye every 6 (six) hours. Place 1/2 inch ribbon of ointment in the affected eye 4 times a day 02/09/19   Margarita Mail, PA-C  glucose blood test strip Use as instructed. Check blood glucose levels by fingerstick twice per day 02/14/18   Gildardo Pounds, NP  ibuprofen (ADVIL) 200 MG tablet Take 400 mg by mouth every 6 (six) hours as needed for moderate pain.    [provider]  insulin glargine (LANTUS) 100 UNIT/ML injection Inject 0.3 mLs (30 Units total) into the skin 2 (two) times daily. 02/14/18   Gildardo Pounds, NP  liraglutide (VICTOZA) 18 MG/3ML SOPN Inject 0.3 mLs (1.8 mg total) into the skin daily. 02/14/18 02/09/19  Gildardo Pounds, NP  lisinopril (PRINIVIL,ZESTRIL) 20 MG tablet Take 1 tablet (20 mg total) by mouth daily. 02/14/18   Gildardo Pounds, NP  meloxicam (MOBIC) 15 MG tablet Take 1 tablet (15 mg total) by mouth daily. Patient not taking: Reported on 02/09/2019 03/13/18   Landis Martins, DPM  meloxicam (MOBIC) 15 MG tablet Take 1 tablet (15 mg total) by mouth daily.  Patient not taking: Reported on 02/09/2019 03/13/18   Landis Martins, DPM  metFORMIN (GLUCOPHAGE) 500 MG tablet Take 2 tablets (1,000 mg total) by mouth 2 (two) times daily with a meal. MUST MAKE APPT FOR FURTHER REFILLS Patient taking differently: Take 1,000 mg by mouth 2 (two) times daily with a meal.  05/18/18   Gildardo Pounds, NP  Misc. Devices MISC Please provide patient with glucerna for nutritional support. 02/14/18   Gildardo Pounds, NP  naproxen (NAPROSYN) 375 MG tablet Take 1 tablet (375 mg total) by mouth 2 (two) times daily. Patient not taking: Reported on 02/09/2019  03/19/18   Wurst, [place], PA-C  VENTOLIN HFA 108 (90 Base) MCG/ACT inhaler INHALE ONE TO TWO PUFFS INTO THE LUNGS EVERY 6 HOURS AS NEEDED FOR WHEEZING OR SHORTNESS OF BREATH Patient taking differently: Inhale 2 puffs into the lungs every 4 (four) hours as needed for wheezing.  04/13/18   Gildardo Pounds, NP    Family History Family History  Problem Relation Age of Onset  . Hypertension Neg Hx   . Diabetes Neg Hx     Social History Social History   Tobacco Use  . Smoking status: Never Smoker  . Smokeless tobacco: Never Used  Substance Use Topics  . Alcohol use: No  . Drug use: No     Allergies   Latex, Morphine and related, and Tape   Review of Systems Review of Systems  Constitutional: Positive for activity change.  Gastrointestinal: Positive for nausea. Negative for vomiting.  Musculoskeletal: Negative for gait problem.  Allergic/Immunologic: Negative for immunocompromised state.  Neurological: Positive for headaches. Negative for speech difficulty, weakness, light-headedness and numbness.     Physical Exam Updated Vital Signs BP 137/89   Pulse 76   Temp 98.2 F (36.8 C) (Oral)   Resp 17   Ht '5\' 11"'  (1.803 m)   Wt 117.9 kg   SpO2 100%   BMI 36.26 kg/m   Physical Exam Vitals signs and nursing note reviewed.  Constitutional:      Appearance: She is well-developed.  HENT:     Head: Normocephalic and atraumatic.  Eyes:     Extraocular Movements: Extraocular movements intact.     Pupils: Pupils are equal, round, and reactive to light.  Neck:     Musculoskeletal: Normal range of motion and neck supple.  Cardiovascular:     Rate and Rhythm: Normal rate.  Pulmonary:     Effort: Pulmonary effort is normal.  Abdominal:     General: Bowel sounds are normal.  Skin:    General: Skin is warm and dry.  Neurological:     Mental Status: She is alert and oriented to person, place, and time.     Cranial Nerves: No cranial nerve deficit.     Sensory: No  sensory deficit.      ED Treatments / Results  Labs (all labs ordered are listed, but only abnormal results are displayed) Labs Reviewed  BASIC METABOLIC PANEL - Abnormal; Notable for the following components:      Result Value   Sodium 133 (*)    Glucose, Bld 368 (*)    Calcium 8.4 (*)    All other components within normal limits    EKG None  Radiology Ct Head Wo Contrast  Result Date: 03/04/2019 CLINICAL DATA:  Headaches and earache, morning headaches for 2 weeks, hyperglycemia, nausea without vomiting, asthma, type II diabetes mellitus EXAM: CT HEAD WITHOUT CONTRAST TECHNIQUE: Contiguous axial images were obtained from  the base of the skull through the vertex without intravenous contrast. Sagittal and coronal MPR images reconstructed from axial data set. COMPARISON:  None FINDINGS: Brain: Minimal asymmetry of the lateral ventricles, RIGHT lateral ventricle slightly larger than RIGHT, within expected limits of normal. No midline shift or mass effect. Normal appearance of brain parenchyma. No intracranial hemorrhage, mass lesion or evidence of acute infarction. No extra-axial fluid collections. Vascular: Unremarkable Skull: Normal appearance Sinuses/Orbits: Clear Other: N/A IMPRESSION: Normal exam. Electronically Signed   By: Lavonia Dana M.D.   On: 03/04/2019 12:34    Procedures Procedures (including critical care time)  Medications Ordered in ED Medications  insulin aspart (novoLOG) injection 5 Units (5 Units Intravenous Given 03/04/19 1037)     Initial Impression / Assessment and Plan / ED Course  I have reviewed the triage vital signs and the nursing notes.  Pertinent labs & imaging results that were available during my care of the patient were reviewed by me and considered in my medical decision making (see chart for details).  Clinical Course as of Mar 04 1339  Mon Mar 04, 2019  1336 The patient appears reasonably screened and/or stabilized for discharge and I doubt any  other medical condition or other [facility] requiring further screening, evaluation, or treatment in the ED at this time prior to discharge.  Results from the ER workup discussed with the patient face to face and all questions answered to the best of my ability. The patient is safe for discharge with strict return precautions.    [AN]  1336 Patient is requesting a letter stating that mold exposure is not good for her, so that she can provide that to her landlord.  We will provide a generic letter.   [AN]  1339 Generic letter has been provided to the patient upon request about need for the mold to be cleared up by the landlord.  I have made it clear to her that we are not sure what is the cause for her underlying headache, it could be the mold or it could be other medical reasons.   [AN]    Clinical Course User Index [AN] Varney Biles, MD       43 year old female comes in a chief complaint of headache. She is asthma and diabetes.  Headaches have been present for the last 2 weeks and are wake-up headaches.  The headache does not wake her up, review of system is positive for nausea but no focal neurologic deficits.  There is also new mold exposure in the house.  History and exam exam is not suggestive of any acute neurological process.  Patient does not have any red flags for thrombosis, infection.  She could have idiopathic intracranial hypertension or tumor, which is I will get a CT head.  The concern stems from the fact that she is having wake-up headaches every day for the last 2 weeks.   She reports that there is mold exposure in the house.  I think it could be contributing but, that is not something we would be able to prove direct causation for in the ER.  Currently the headaches have resolved and there is nothing to do.  CT brain ordered.  Neuro follow-up will be recommended if the CT scan is negative and the headaches persist.  Patient's blood sugar is slightly high, give her given her  some insulin in the ED.  She has not taken her morning meds which is likely contributing.  Labs to rule out DKA.  Final Clinical Impressions(s) / ED Diagnoses   Final diagnoses:  Daily headache  Exposure to mold    ED Discharge Orders    None        Varney Biles, MD 03/04/19 1340

## 2019-03-04 NOTE — ED Notes (Signed)
Patient transported to CT 

## 2019-03-04 NOTE — ED Triage Notes (Signed)
Per EMS patient called for headache and ear ache. Patient hyperglycemia with BS of 441. Taking insulin and metformin as prescribed as has not eaten today. Nausea without vomiting. 1L NS, 4 ZOFRAN, 1G tylenol en route.

## 2019-08-24 ENCOUNTER — Emergency Department (HOSPITAL_COMMUNITY)
Admission: EM | Admit: 2019-08-24 | Discharge: 2019-08-24 | Disposition: A | Discharge status: Home / Self Care | Payer: Medicare HMO | Attending: Emergency Medicine | Admitting: Emergency Medicine

## 2019-08-24 ENCOUNTER — Emergency Department (HOSPITAL_COMMUNITY): Payer: Medicare HMO

## 2019-08-24 ENCOUNTER — Encounter (HOSPITAL_COMMUNITY): Payer: Self-pay

## 2019-08-24 ENCOUNTER — Other Ambulatory Visit: Payer: Self-pay

## 2019-08-24 DIAGNOSIS — E1165 Type 2 diabetes mellitus with hyperglycemia: Secondary | ICD-10-CM | POA: Insufficient documentation

## 2019-08-24 DIAGNOSIS — Z79899 Other long term (current) drug therapy: Secondary | ICD-10-CM | POA: Diagnosis not present

## 2019-08-24 DIAGNOSIS — B373 Candidiasis of vulva and vagina: Secondary | ICD-10-CM | POA: Diagnosis not present

## 2019-08-24 DIAGNOSIS — Z794 Long term (current) use of insulin: Secondary | ICD-10-CM | POA: Diagnosis not present

## 2019-08-24 DIAGNOSIS — Z91040 Latex allergy status: Secondary | ICD-10-CM | POA: Insufficient documentation

## 2019-08-24 DIAGNOSIS — R739 Hyperglycemia, unspecified: Secondary | ICD-10-CM

## 2019-08-24 DIAGNOSIS — B3731 Acute candidiasis of vulva and vagina: Secondary | ICD-10-CM

## 2019-08-24 DIAGNOSIS — R079 Chest pain, unspecified: Secondary | ICD-10-CM

## 2019-08-24 DIAGNOSIS — R0789 Other chest pain: Secondary | ICD-10-CM | POA: Diagnosis present

## 2019-08-24 LAB — I-STAT BETA HCG BLOOD, ED (MC, WL, AP ONLY): I-stat hCG, quantitative: <5 m[IU]/mL (ref <5)

## 2019-08-24 LAB — URINALYSIS, ROUTINE W REFLEX MICROSCOPIC
Bacteria, UA: NONE SEEN
Bilirubin Urine: NEGATIVE
Glucose, UA: >=500 mg/dL — AB
Hgb urine dipstick: NEGATIVE
Ketones, ur: 5 mg/dL — AB
Leukocytes,Ua: NEGATIVE
Nitrite: NEGATIVE
Protein, ur: NEGATIVE mg/dL
Specific Gravity, Urine: 1.03 (ref 1.005–1.030)
pH: 6 (ref 5.0–8.0)

## 2019-08-24 LAB — BASIC METABOLIC PANEL
Anion gap: 12 (ref 5–15)
BUN: 12 mg/dL (ref 6–20)
CO2: 22 mmol/L (ref 22–32)
Calcium: 9.1 mg/dL (ref 8.9–10.3)
Chloride: 98 mmol/L (ref 98–111)
Creatinine, Ser: 0.62 mg/dL (ref 0.44–1.00)
GFR calc Af Amer: >60 mL/min (ref >60)
GFR calc non Af Amer: >60 mL/min (ref >60)
Glucose, Bld: 390 mg/dL — ABNORMAL HIGH (ref 70–99)
Potassium: 4.3 mmol/L (ref 3.5–5.1)
Sodium: 132 mmol/L — ABNORMAL LOW (ref 135–145)

## 2019-08-24 LAB — CBC
HCT: 42.3 % (ref 36.0–46.0)
Hemoglobin: 14.3 g/dL (ref 12.0–15.0)
MCH: 27.8 pg (ref 26.0–34.0)
MCHC: 33.8 g/dL (ref 30.0–36.0)
MCV: 82.1 fL (ref 80.0–100.0)
Platelets: 339 10*3/uL (ref 150–400)
RBC: 5.15 MIL/uL — ABNORMAL HIGH (ref 3.87–5.11)
RDW: 12.9 % (ref 11.5–15.5)
WBC: 9.5 10*3/uL (ref 4.0–10.5)
nRBC: 0 % (ref 0.0–0.2)

## 2019-08-24 LAB — WET PREP, GENITAL
Clue Cells Wet Prep HPF POC: NONE SEEN
Sperm: NONE SEEN
Trich, Wet Prep: NONE SEEN
Yeast Wet Prep HPF POC: NONE SEEN

## 2019-08-24 LAB — TROPONIN I (HIGH SENSITIVITY)
Troponin I (High Sensitivity): 3 ng/L (ref <18)
Troponin I (High Sensitivity): 3 ng/L (ref <18)

## 2019-08-24 MED ORDER — FLUCONAZOLE 150 MG PO TABS
150.0000 mg | ORAL_TABLET | Freq: Once | ORAL | Status: AC
  Administered 2019-08-24: 150 mg via ORAL
  Filled 2019-08-24: qty 1

## 2019-08-24 MED ORDER — FLUCONAZOLE 150 MG PO TABS
150.0000 mg | ORAL_TABLET | ORAL | 0 refills | Status: DC
Start: 2019-08-27 — End: 2019-11-08

## 2019-08-24 MED ORDER — SODIUM CHLORIDE 0.9 % IV BOLUS
1000.0000 mL | Freq: Once | INTRAVENOUS | Status: AC
  Administered 2019-08-24: 1000 mL via INTRAVENOUS

## 2019-08-24 MED ORDER — CLOTRIMAZOLE-BETAMETHASONE 1-0.05 % EX CREA
TOPICAL_CREAM | CUTANEOUS | 0 refills | Status: DC
Start: 2019-08-24 — End: 2020-02-12

## 2019-08-24 MED ORDER — CLOTRIMAZOLE 1 % EX CREA
TOPICAL_CREAM | Freq: Once | CUTANEOUS | Status: DC
  Filled 2019-08-24: qty 15

## 2019-08-24 MED ORDER — SODIUM CHLORIDE 0.9% FLUSH
3.0000 mL | Freq: Once | INTRAVENOUS | Status: AC
  Administered 2019-08-24: 3 mL via INTRAVENOUS

## 2019-08-24 MED ORDER — KETOROLAC TROMETHAMINE 15 MG/ML IJ SOLN
15.0000 mg | Freq: Once | INTRAMUSCULAR | Status: AC
  Administered 2019-08-24: 15 mg via INTRAVENOUS
  Filled 2019-08-24: qty 1

## 2019-08-24 MED ORDER — HYDROCODONE-ACETAMINOPHEN 5-325 MG PO TABS
1.0000 | ORAL_TABLET | Freq: Once | ORAL | Status: AC
  Administered 2019-08-24: 1 via ORAL
  Filled 2019-08-24: qty 1

## 2019-08-24 MED ORDER — INSULIN GLARGINE 100 UNIT/ML ~~LOC~~ SOLN: 30.0000 [IU] | Freq: Two times a day (BID) | SUBCUTANEOUS | 3 refills | Status: DC

## 2019-08-24 MED ORDER — METFORMIN HCL 500 MG PO TABS: 1000.0000 mg | ORAL_TABLET | Freq: Two times a day (BID) | ORAL | 1 refills | Status: DC

## 2019-08-24 MED ORDER — VICTOZA 18 MG/3ML ~~LOC~~ SOPN: 1.8000 mg | PEN_INJECTOR | Freq: Every day | SUBCUTANEOUS | 3 refills | Status: DC

## 2019-08-24 NOTE — ED Provider Notes (Signed)
[facility]ERGENC[facility] DEPARTMENT Provider Note   CSN: [id number] Arrival date & time: 08/24/19  1536     History Chief Complaint  Patient presents with  . Chest Pain  . Vaginal Pain    Evelyn Watts is a 44 y.o. female.  HPI 44 year old female vaginal pain.  States it has been ongoing since March of last year.  Seems to be worsening recently.  She has noticed some white vaginal discharge.  Hurts from her vagina back towards her rectum.  No abdominal pain, vomiting, diarrhea, or urinary symptoms.  Denies concern for STI.  No fevers.  She also notes some chest pain that started this afternoon around 1 PM and felt like a dull sensation.  Has come and gone and currently is not present.  No shortness of breath or cough.   Past Medical History:  Diagnosis Date  . Asthma   . Diabetes mellitus without complication [facility])     Patient Active Problem List   Diagnosis Date Noted  . Type 2 diabetes mellitus with hyperglycemia, with long-term current use of insulin ([place]) 02/14/2018  . Diabetes mellitus without complication ([place]) 12/87/8676  . Moderate episode of recurrent major depressive disorder ([place]) 05/24/2016  . Morbid obesity ([place]) 05/24/2016    Past Surgical History:  Procedure Laterality Date  . CESAREAN SECTION     4  . CHOLECYSTECTOMY    . HERNIA REPAIR    . TONSILLECTOMY       OB History   No obstetric history on file.     Family History  Problem Relation Age of Onset  . Hypertension Neg Hx   . Diabetes Neg Hx     Social History   Tobacco Use  . Smoking status: Never Smoker  . Smokeless tobacco: Never Used  Substance Use Topics  . Alcohol use: No  . Drug use: No    Home Medications Prior to Admission medications   Medication Sig Start Date End Date Taking? Authorizing Provider  atorvastatin (LIPITOR) 20 MG tablet Take 1 tablet (20 mg total) by mouth daily. Patient not taking: Reported on 02/09/2019 02/14/18   Gildardo Pounds, NP    blood glucose meter kit and supplies KIT Dispense based on patient and insurance preference. Use up to four times daily as directed. (FOR ICD-9 250.00, 250.01). 10/10/18   Couture, Cortni S, PA-C  clotrimazole-betamethasone (LOTRISONE) cream Apply to affected area 2 times daily prn 08/24/19   Sherwood Gambler, MD  erythromycin ophthalmic ointment Place 1 application into the left eye every 6 (six) hours. Place 1/2 inch ribbon of ointment in the affected eye 4 times a day 02/09/19   Margarita Mail, PA-C  fluconazole (DIFLUCAN) 150 MG tablet Take 1 tablet (150 mg total) by mouth every 3 (three) days. 08/27/19   Sherwood Gambler, MD  glucose blood test strip Use as instructed. Check blood glucose levels by fingerstick twice per day 02/14/18   Gildardo Pounds, NP  ibuprofen (ADVIL) 200 MG tablet Take 400 mg by mouth every 6 (six) hours as needed for moderate pain.    [provider]  insulin glargine (LANTUS) 100 UNIT/ML injection Inject 0.3 mLs (30 Units total) into the skin 2 (two) times daily. 08/24/19   Sherwood Gambler, MD  liraglutide (VICTOZA) 18 MG/3ML SOPN Inject 0.3 mLs (1.8 mg total) into the skin daily. 08/24/19 09/23/19  Sherwood Gambler, MD  lisinopril (PRINIVIL,ZESTRIL) 20 MG tablet Take 1 tablet (20 mg total) by mouth daily. 02/14/18   Raul Del,  Vernia Buff, NP  meloxicam (MOBIC) 15 MG tablet Take 1 tablet (15 mg total) by mouth daily. Patient not taking: Reported on 02/09/2019 03/13/18   Landis Martins, DPM  meloxicam (MOBIC) 15 MG tablet Take 1 tablet (15 mg total) by mouth daily. Patient not taking: Reported on 02/09/2019 03/13/18   Landis Martins, DPM  metFORMIN (GLUCOPHAGE) 500 MG tablet Take 2 tablets (1,000 mg total) by mouth 2 (two) times daily with a meal. 08/24/19 10/23/19  Sherwood Gambler, MD  Misc. Devices MISC Please provide patient with glucerna for nutritional support. 02/14/18   Gildardo Pounds, NP  naproxen (NAPROSYN) 375 MG tablet Take 1 tablet (375 mg total) by mouth 2 (two)  times daily. Patient not taking: Reported on 02/09/2019 03/19/18   Wurst, [place], PA-C  VENTOLIN HFA 108 (90 Base) MCG/ACT inhaler INHALE ONE TO TWO PUFFS INTO THE LUNGS EVERY 6 HOURS AS NEEDED FOR WHEEZING OR SHORTNESS OF BREATH Patient taking differently: Inhale 2 puffs into the lungs every 4 (four) hours as needed for wheezing.  04/13/18   Gildardo Pounds, NP    Allergies    Latex, Morphine and related, and Tape  Review of Systems   Review of Systems  Respiratory: Negative for cough and shortness of breath.   Cardiovascular: Positive for chest pain.  Gastrointestinal: Negative for abdominal pain, diarrhea and vomiting.  Genitourinary: Positive for vaginal discharge and vaginal pain. Negative for dysuria.  Musculoskeletal: Negative for back pain.  All other systems reviewed and are negative.   Physical Exam Updated Vital Signs BP 137/90 (BP Location: Right Arm)   Pulse 98   Temp (!) 97.4 F (36.3 C) (Oral)   Resp 19   SpO2 99%   Physical Exam Vitals and nursing note reviewed. Exam conducted with a chaperone present.  Constitutional:      Appearance: She is well-developed. She is obese.  HENT:     Head: Normocephalic and atraumatic.     Right Ear: External ear normal.     Left Ear: External ear normal.     Nose: Nose normal.  Eyes:     General:        Right eye: No discharge.        Left eye: No discharge.  Cardiovascular:     Rate and Rhythm: Normal rate and regular rhythm.     Heart sounds: Normal heart sounds.  Pulmonary:     Effort: Pulmonary effort is normal.     Breath sounds: Normal breath sounds.  Abdominal:     Palpations: Abdomen is soft.     Tenderness: There is no abdominal tenderness.  Genitourinary:    Labia:        Right: Rash present.        Left: Rash present.      Vagina: Vaginal discharge (white) present.       Comments: Severe dermatitis without discharge/purulence. Tender to touch Skin:    General: Skin is warm and dry.    Neurological:     Mental Status: She is alert.  Psychiatric:        Mood and Affect: Mood is not anxious.     ED Results / Procedures / Treatments   Labs (all labs ordered are listed, but only abnormal results are displayed) Labs Reviewed  WET PREP, GENITAL - Abnormal; Notable for the following components:      Result Value   WBC, Wet Prep HPF POC MANY (*)    All other components within normal limits  BASIC METABOLIC PANEL - Abnormal; Notable for the following components:   Sodium 132 (*)    Glucose, Bld 390 (*)    All other components within normal limits  CBC - Abnormal; Notable for the following components:   RBC 5.15 (*)    All other components within normal limits  URINALYSIS, ROUTINE W REFLEX MICROSCOPIC - Abnormal; Notable for the following components:   Color, Urine STRAW (*)    Glucose, UA >=500 (*)    Ketones, ur 5 (*)    All other components within normal limits  I-STAT BETA HCG BLOOD, ED ([facility], [facility], [facility] ONLY)  GC/CHLAMYDIA PROBE AMP ([place]) NOT AT [facility]  TROPONIN I (HIGH SENSITIVITY)  TROPONIN I (HIGH SENSITIVITY)    EKG EKG Interpretation  Date/Time:  Saturday Aug 24 2019 15:57:17 EDT Ventricular Rate:  94 PR Interval:  142 QRS Duration: 86 QT Interval:  374 QTC Calculation: 467 R Axis:   12 Text Interpretation: Normal sinus rhythm no acute ST/T changes similar to 2018 Confirmed by Sherwood Gambler [phone]) on 08/24/2019 6:33:05 PM   Radiology DG Chest 2 View  Result Date: 08/24/2019 CLINICAL DATA:  Patient states she is experiencing chest pain that started today. EXAM: CHEST - 2 VIEW COMPARISON:  Chest radiograph 07/21/2017 FINDINGS: The heart size and mediastinal contours are within normal limits. The lungs are clear. No pneumothorax or pleural effusion. The visualized skeletal structures are unremarkable. IMPRESSION: No acute cardiopulmonary finding. Electronically Signed   By: Audie Pinto M.D.   On: 08/24/2019 16:51    Procedures Procedures  (including critical care time)  Medications Ordered in ED Medications  clotrimazole (LOTRIMIN) 1 % cream (has no administration in time range)  sodium chloride flush (NS) 0.9 % injection 3 mL (3 mLs Intravenous Given 08/24/19 2012)  sodium chloride 0.9 % bolus 1,000 mL (0 mLs Intravenous Stopped 08/24/19 2309)  ketorolac (TORADOL) 15 MG/ML injection 15 mg (15 mg Intravenous Given 08/24/19 2018)  fluconazole (DIFLUCAN) tablet 150 mg (150 mg Oral Given 08/24/19 2046)  HYDROcodone-acetaminophen (NORCO/VICODIN) 5-325 MG per tablet 1 tablet (1 tablet Oral Given 08/24/19 2046)    ED Course  I have reviewed the triage vital signs and the nursing notes.  Pertinent labs & imaging results that were available during my care of the patient were reviewed by me and considered in my medical decision making (see chart for details).    MDM Rules/Calculators/A&P                      Patient has extensive vulvovaginitis. Presumably from fungal infection given her uncontrolled diabetes. Given oral and topical anti-fungals. Her CP is atypical and with 2 negative troponins and benign ECG, I think ACS is unlikely. Doubt PE, dissection. She denies sexual activity. Doubt STI, though will send cultures. Will d/c home with PCP and Gyn referrals. Her diabetic meds were refilled given no current PCP.  Final Clinical Impression(s) / ED Diagnoses Final diagnoses:  Candidal vulvovaginitis  Hyperglycemia  Nonspecific chest pain    Rx / DC Orders ED Discharge Orders         Ordered    fluconazole (DIFLUCAN) 150 MG tablet  every 72 hours     08/24/19 2152    insulin glargine (LANTUS) 100 UNIT/ML injection  2 times daily     08/24/19 2152    liraglutide (VICTOZA) 18 MG/3ML SOPN  Daily     08/24/19 2152    metFORMIN (GLUCOPHAGE) 500 MG tablet  2 times  daily with meals     08/24/19 2152    clotrimazole-betamethasone (LOTRISONE) cream     08/24/19 2152           Sherwood Gambler, MD 08/25/19 [phone]

## 2019-08-24 NOTE — Discharge Instructions (Addendum)
You were given your first dose of antifungal today.  Take the next pill on 5/4 and then again on 5/7.  You may use the cream.  Otherwise, follow-up with a local primary care physician for ongoing diabetes management.  If you develop recurrent, continued, or worsening chest pain, shortness of breath, fever, vomiting, abdominal or back pain, or any other new/concerning symptoms then return to the ER for evaluation.

## 2019-08-24 NOTE — ED Triage Notes (Signed)
Per [facility] pt from home non radiating Left side chest pain x2 hour, described it as pressure, pain 4/10, took 324 mg ASA pain now 0/10. Pt also reports vaginal pain x1 year, brown vaginal dc x1 week, denies possible pregnancy due to tubal ligation denies sexual activity. Pt described her pain as sharp 10/10 radiates from front of her vagina to her rectum.  BP 129/98 HR 98 98% RA  97.5 CBG 368 pt currently changing her diabetic medication, typically runs in the 300's

## 2019-08-26 LAB — GC/CHLAMYDIA PROBE AMP (~~LOC~~) NOT AT ARMC
Chlamydia: NEGATIVE
Comment: NEGATIVE
Comment: NORMAL
Neisseria Gonorrhea: NEGATIVE

## 2019-09-26 ENCOUNTER — Ambulatory Visit: Payer: Medicare Other | Admitting: Podiatry

## 2019-10-15 ENCOUNTER — Ambulatory Visit: Payer: Medicare HMO | Admitting: Sports Medicine

## 2019-11-08 ENCOUNTER — Emergency Department (HOSPITAL_COMMUNITY)
Admission: EM | Admit: 2019-11-08 | Discharge: 2019-11-09 | Disposition: A | Discharge status: Home / Self Care | Payer: Medicare HMO | Attending: Emergency Medicine | Admitting: Emergency Medicine

## 2019-11-08 ENCOUNTER — Encounter (HOSPITAL_COMMUNITY): Payer: Self-pay | Admitting: Emergency Medicine

## 2019-11-08 DIAGNOSIS — Z79899 Other long term (current) drug therapy: Secondary | ICD-10-CM | POA: Diagnosis not present

## 2019-11-08 DIAGNOSIS — Z91040 Latex allergy status: Secondary | ICD-10-CM | POA: Insufficient documentation

## 2019-11-08 DIAGNOSIS — J45909 Unspecified asthma, uncomplicated: Secondary | ICD-10-CM | POA: Diagnosis not present

## 2019-11-08 DIAGNOSIS — B3731 Acute candidiasis of vulva and vagina: Secondary | ICD-10-CM

## 2019-11-08 DIAGNOSIS — B373 Candidiasis of vulva and vagina: Secondary | ICD-10-CM | POA: Insufficient documentation

## 2019-11-08 DIAGNOSIS — R739 Hyperglycemia, unspecified: Secondary | ICD-10-CM | POA: Diagnosis present

## 2019-11-08 DIAGNOSIS — Z794 Long term (current) use of insulin: Secondary | ICD-10-CM | POA: Diagnosis not present

## 2019-11-08 DIAGNOSIS — E1165 Type 2 diabetes mellitus with hyperglycemia: Secondary | ICD-10-CM | POA: Diagnosis not present

## 2019-11-08 LAB — CBC
HCT: 39.8 % (ref 36.0–46.0)
Hemoglobin: 13.6 g/dL (ref 12.0–15.0)
MCH: 27.6 pg (ref 26.0–34.0)
MCHC: 34.2 g/dL (ref 30.0–36.0)
MCV: 80.9 fL (ref 80.0–100.0)
Platelets: 472 10*3/uL — ABNORMAL HIGH (ref 150–400)
RBC: 4.92 MIL/uL (ref 3.87–5.11)
RDW: 13.4 % (ref 11.5–15.5)
WBC: 8.2 10*3/uL (ref 4.0–10.5)
nRBC: 0 % (ref 0.0–0.2)

## 2019-11-08 LAB — CBG MONITORING, ED
Glucose-Capillary: 392 mg/dL — ABNORMAL HIGH (ref 70–99)
Glucose-Capillary: 376 mg/dL — ABNORMAL HIGH (ref 70–99)

## 2019-11-08 LAB — URINALYSIS, ROUTINE W REFLEX MICROSCOPIC
Bacteria, UA: NONE SEEN
Bilirubin Urine: NEGATIVE
Glucose, UA: >=500 mg/dL — AB
Ketones, ur: 20 mg/dL — AB
Nitrite: NEGATIVE
Protein, ur: NEGATIVE mg/dL
Specific Gravity, Urine: 1.039 — ABNORMAL HIGH (ref 1.005–1.030)
pH: 6 (ref 5.0–8.0)

## 2019-11-08 LAB — BASIC METABOLIC PANEL
Anion gap: 12 (ref 5–15)
BUN: 12 mg/dL (ref 6–20)
CO2: 24 mmol/L (ref 22–32)
Calcium: 9.4 mg/dL (ref 8.9–10.3)
Chloride: 97 mmol/L — ABNORMAL LOW (ref 98–111)
Creatinine, Ser: 0.69 mg/dL (ref 0.44–1.00)
GFR calc Af Amer: >60 mL/min (ref >60)
GFR calc non Af Amer: >60 mL/min (ref >60)
Glucose, Bld: 392 mg/dL — ABNORMAL HIGH (ref 70–99)
Potassium: 4.1 mmol/L (ref 3.5–5.1)
Sodium: 133 mmol/L — ABNORMAL LOW (ref 135–145)

## 2019-11-08 LAB — I-STAT BETA HCG BLOOD, ED (MC, WL, AP ONLY): I-stat hCG, quantitative: <5 m[IU]/mL (ref <5)

## 2019-11-08 MED ORDER — NYSTATIN 100000 UNIT/GM EX POWD
Freq: Once | CUTANEOUS | Status: AC
  Filled 2019-11-08: qty 15

## 2019-11-08 MED ORDER — ONDANSETRON 4 MG PO TBDP
4.0000 mg | ORAL_TABLET | Freq: Once | ORAL | Status: AC
  Administered 2019-11-08: 4 mg via ORAL
  Filled 2019-11-08: qty 1

## 2019-11-08 MED ORDER — ACETAMINOPHEN 325 MG PO TABS
650.0000 mg | ORAL_TABLET | Freq: Once | ORAL | Status: AC
  Administered 2019-11-08: 650 mg via ORAL
  Filled 2019-11-08: qty 2

## 2019-11-08 MED ORDER — NYSTATIN 100000 UNIT/GM EX POWD
1.0000 | Freq: Three times a day (TID) | CUTANEOUS | 0 refills | Status: DC
Start: 2019-11-08 — End: 2022-07-28

## 2019-11-08 MED ORDER — GLUCOSE BLOOD VI STRP: ORAL_STRIP | 1 refills | Status: DC

## 2019-11-08 MED ORDER — SODIUM CHLORIDE 0.9 % IV BOLUS
1000.0000 mL | Freq: Once | INTRAVENOUS | Status: AC
  Administered 2019-11-08: 1000 mL via INTRAVENOUS

## 2019-11-08 MED ORDER — FLUCONAZOLE 150 MG PO TABS
150.0000 mg | ORAL_TABLET | Freq: Once | ORAL | Status: AC
  Administered 2019-11-08: 150 mg via ORAL
  Filled 2019-11-08: qty 1

## 2019-11-08 MED ORDER — FLUCONAZOLE 150 MG PO TABS
ORAL_TABLET | ORAL | 0 refills | Status: DC
Start: 2019-11-08 — End: 2020-04-27

## 2019-11-08 NOTE — ED Provider Notes (Signed)
[facility] DEPT Provider Note   CSN: [id number] Arrival date & time: 11/08/19  1658     History Chief Complaint  Patient presents with  . Hyperglycemia  . Vaginal Pain    Evelyn Watts is a 44 y.o. female w past medical history of type 2 diabetes, asthma, presenting to the emergency w complaint of hyperglycemia. Reports high blood sugars over the last few months.  She takes Victoza and Lantus twice daily.  She states she was also prescribed Trulicity which was helping her with good control of her diabetes, however her insurance was no longer sending her the medication because she needs a renewal of her prescription.  She states she has not been taking her Lantus in the mornings, however is taking her Victoza in the mornings and Lantus in the evenings. Patient also has a separate complaint of rash to the groin region.  It began about 7-8 months ago.  She has been given multiple medications/creams for this, however it improved some and then returns.  She describes the rash as itchy and painful.  Denies associated urinary symptoms, vaginal discharge, abdominal pain.  Per chart review, patient has been evaluated recently in May with for full vaginal candidiasis.  She was treated with Diflucan and topical cream.  She states the symptoms improved, however return after few days.  The history is provided by the patient and medical records.       Past Medical History:  Diagnosis Date  . Asthma   . Diabetes mellitus without complication [facility])     Patient Active Problem List   Diagnosis Date Noted  . Type 2 diabetes mellitus with hyperglycemia, with long-term current use of insulin ([place]) 02/14/2018  . Diabetes mellitus without complication ([place]) 30/10/6224  . Moderate episode of recurrent major depressive disorder ([place]) 05/24/2016  . Morbid obesity ([place]) 05/24/2016    Past Surgical History:  Procedure Laterality Date  . CESAREAN SECTION     4  .  CHOLECYSTECTOMY    . HERNIA REPAIR    . TONSILLECTOMY       OB History   No obstetric history on file.     Family History  Problem Relation Age of Onset  . Hypertension Neg Hx   . Diabetes Neg Hx     Social History   Tobacco Use  . Smoking status: Never Smoker  . Smokeless tobacco: Never Used  Vaping Use  . Vaping Use: Never used  Substance Use Topics  . Alcohol use: No  . Drug use: No    Home Medications Prior to Admission medications   Medication Sig Start Date End Date Taking? Authorizing Provider  ibuprofen (ADVIL) 200 MG tablet Take 400 mg by mouth every 6 (six) hours as needed for moderate pain.   Yes [provider]  insulin glargine (LANTUS) 100 UNIT/ML injection Inject 0.3 mLs (30 Units total) into the skin 2 (two) times daily. 08/24/19  Yes Sherwood Gambler, MD  liraglutide (VICTOZA) 18 MG/3ML SOPN Inject 1.8 mg into the skin daily.   Yes [provider]  lisinopril (PRINIVIL,ZESTRIL) 20 MG tablet Take 1 tablet (20 mg total) by mouth daily. 02/14/18  Yes Gildardo Pounds, NP  metFORMIN (GLUCOPHAGE) 500 MG tablet Take 1,000 mg by mouth 2 (two) times daily with a meal.   Yes [provider]  VENTOLIN HFA 108 (90 Base) MCG/ACT inhaler INHALE ONE TO TWO PUFFS INTO THE LUNGS EVERY 6 HOURS AS NEEDED FOR WHEEZING OR SHORTNESS OF BREATH  Patient taking differently: Inhale 2 puffs into the lungs every 4 (four) hours as needed for wheezing.  04/13/18  Yes Gildardo Pounds, NP  atorvastatin (LIPITOR) 20 MG tablet Take 1 tablet (20 mg total) by mouth daily. Patient not taking: Reported on 02/09/2019 02/14/18   Gildardo Pounds, NP  blood glucose meter kit and supplies KIT Dispense based on patient and insurance preference. Use up to four times daily as directed. (FOR ICD-9 250.00, 250.01). 10/10/18   Couture, Cortni S, PA-C  clotrimazole-betamethasone (LOTRISONE) cream Apply to affected area 2 times daily prn Patient not taking: Reported on 11/08/2019  08/24/19   Sherwood Gambler, MD  erythromycin ophthalmic ointment Place 1 application into the left eye every 6 (six) hours. Place 1/2 inch ribbon of ointment in the affected eye 4 times a day Patient not taking: Reported on 11/08/2019 02/09/19   Margarita Mail, PA-C  fluconazole (DIFLUCAN) 150 MG tablet Take one tab on 7/19 and one tab on 7/22 11/08/19   Gaylord Seydel, [place] N, PA-C  glucose blood test strip Use as instructed. Check blood glucose levels by fingerstick twice per day 11/08/19   Remijio Holleran, [place] N, PA-C  liraglutide (VICTOZA) 18 MG/3ML SOPN Inject 0.3 mLs (1.8 mg total) into the skin daily. 08/24/19 09/23/19  Sherwood Gambler, MD  meloxicam (MOBIC) 15 MG tablet Take 1 tablet (15 mg total) by mouth daily. Patient not taking: Reported on 02/09/2019 03/13/18   Landis Martins, DPM  meloxicam (MOBIC) 15 MG tablet Take 1 tablet (15 mg total) by mouth daily. Patient not taking: Reported on 02/09/2019 03/13/18   Landis Martins, DPM  metFORMIN (GLUCOPHAGE) 500 MG tablet Take 2 tablets (1,000 mg total) by mouth 2 (two) times daily with a meal. 08/24/19 10/23/19  Sherwood Gambler, MD  Misc. Devices MISC Please provide patient with glucerna for nutritional support. 02/14/18   Gildardo Pounds, NP  naproxen (NAPROSYN) 375 MG tablet Take 1 tablet (375 mg total) by mouth 2 (two) times daily. Patient not taking: Reported on 02/09/2019 03/19/18   Wurst, [place], PA-C  nystatin (MYCOSTATIN/NYSTOP) powder Apply 1 application topically 3 (three) times daily. 11/08/19   Alysson Geist, [place] N, PA-C    Allergies    Latex, Morphine and related, and Tape  Review of Systems   Review of Systems  All other systems reviewed and are negative.   Physical Exam Updated Vital Signs BP (!) 141/88   Pulse 93   Temp 99 F (37.2 C) (Oral)   Resp 18   SpO2 100%   Physical Exam Vitals and nursing note reviewed.  Constitutional:      General: She is not in acute distress.    Appearance: She is well-developed. She is  obese. She is not ill-appearing.  HENT:     Head: Normocephalic and atraumatic.  Eyes:     Conjunctiva/sclera: Conjunctivae normal.  Cardiovascular:     Rate and Rhythm: Normal rate and regular rhythm.  Pulmonary:     Effort: Pulmonary effort is normal.     Breath sounds: Normal breath sounds.  Abdominal:     General: Bowel sounds are normal.     Palpations: Abdomen is soft.     Tenderness: There is no abdominal tenderness.  Genitourinary:    Comments: Moist red confluent rash to groin. Skin:    General: Skin is warm.  Neurological:     Mental Status: She is alert.  Psychiatric:        Behavior: Behavior normal.     ED Results /  Procedures / Treatments   Labs (all labs ordered are listed, but only abnormal results are displayed) Labs Reviewed  BASIC METABOLIC PANEL - Abnormal; Notable for the following components:      Result Value   Sodium 133 (*)    Chloride 97 (*)    Glucose, Bld 392 (*)    All other components within normal limits  CBC - Abnormal; Notable for the following components:   Platelets 472 (*)    All other components within normal limits  URINALYSIS, ROUTINE W REFLEX MICROSCOPIC - Abnormal; Notable for the following components:   Specific Gravity, Urine 1.039 (*)    Glucose, UA >=500 (*)    Hgb urine dipstick MODERATE (*)    Ketones, ur 20 (*)    Leukocytes,Ua SMALL (*)    All other components within normal limits  CBG MONITORING, ED - Abnormal; Notable for the following components:   Glucose-Capillary 392 (*)    All other components within normal limits  CBG MONITORING, ED - Abnormal; Notable for the following components:   Glucose-Capillary 376 (*)    All other components within normal limits  I-STAT BETA HCG BLOOD, ED ([facility], [facility], [facility] ONLY)  CBG MONITORING, ED    EKG EKG Interpretation  Date/Time:  Friday November 08 2019 22:47:53 EDT Ventricular Rate:  96 PR Interval:    QRS Duration: 91 QT Interval:  366 QTC Calculation: 463 R  Axis:   22 Text Interpretation: Sinus rhythm Borderline T abnormalities, inferior leads Confirmed by Dewaine Conger [phone]) on 11/08/2019 11:21:39 PM   Radiology No results found.  Procedures Procedures (including critical care time)  Medications Ordered in ED Medications  ondansetron (ZOFRAN-ODT) disintegrating tablet 4 mg (4 mg Oral Given 11/08/19 1717)  sodium chloride 0.9 % bolus 1,000 mL (0 mLs Intravenous Stopped 11/09/19 0000)  acetaminophen (TYLENOL) tablet 650 mg (650 mg Oral Given 11/08/19 2249)  nystatin (MYCOSTATIN/NYSTOP) topical powder ( Topical Given 11/09/19 0000)  fluconazole (DIFLUCAN) tablet 150 mg (150 mg Oral Given 11/08/19 2249)    ED Course  I have reviewed the triage vital signs and the nursing notes.  Pertinent labs & imaging results that were available during my care of the patient were reviewed by me and considered in my medical decision making (see chart for details).    MDM Rules/Calculators/A&P                          Patient with hyperglycemia secondary to diabetes, as well as a recurrent vulvovaginal candidiasis.  She states her prescription ran out of her Trulicity and she needs her PCP to represcribe it for her insurance to continue sending it.  She overall is in no distress on evaluation, labs with hyperglycemia though no DKA.  Vital signs are stable.  She does have candidiasis of the groin.  Discussed with patient that it is important she manage her blood sugar to prevent recurrence of the candidal infection.  She is treated today with Diflucan and nystatin powder.  Discussed changes at home including good hygiene, keeping her skin dry, loose cotton clothing.  She is instructed to follow-up with PCP.  Stressed oral hydration with water.  Safe for discharge.  Discussed results, findings, treatment and follow up. Patient advised of return precautions. Patient verbalized understanding and agreed with plan.  Final Clinical Impression(s) / ED Diagnoses Final  diagnoses:  Hyperglycemia  Vulvovaginal candidiasis    Rx / DC Orders ED Discharge Orders  Ordered    fluconazole (DIFLUCAN) 150 MG tablet     Discontinue  Reprint     11/08/19 2342    nystatin (MYCOSTATIN/NYSTOP) powder  3 times daily     Discontinue  Reprint     11/08/19 2347    glucose blood test strip     Discontinue  Reprint     11/08/19 [place], [place] N, PA-C 11/09/19 0003    Breck Coons, MD 11/09/19 1018

## 2019-11-08 NOTE — ED Triage Notes (Signed)
Per EMS, states vaginal pain and elevated CBG-patient took metformin this am-CBG 461-symptoms for 2 days

## 2019-11-08 NOTE — Discharge Instructions (Addendum)
In 3 days, take an additional dose of the Diflucan/fluconazole.  Take every 3 days till gone. Apply the nystatin powder to your rash 3 times daily.  It is important to have good hygiene.  Completely dry your skin after showers.  Wear loosefitting cotton clothing. It is important that you follow with primary care for regular management of your diabetes.  Begin using your Lantus in the morning and in the evening as prescribed. It is important you stay hydrated with water.

## 2019-11-09 DIAGNOSIS — E1165 Type 2 diabetes mellitus with hyperglycemia: Secondary | ICD-10-CM | POA: Diagnosis not present

## 2019-12-22 ENCOUNTER — Emergency Department (HOSPITAL_COMMUNITY)
Admission: EM | Admit: 2019-12-22 | Discharge: 2019-12-23 | Disposition: A | Discharge status: Home / Self Care | Payer: Medicare HMO | Attending: Emergency Medicine | Admitting: Emergency Medicine

## 2019-12-22 ENCOUNTER — Other Ambulatory Visit: Payer: Self-pay

## 2019-12-22 ENCOUNTER — Encounter (HOSPITAL_COMMUNITY): Payer: Self-pay | Admitting: Emergency Medicine

## 2019-12-22 DIAGNOSIS — Z5321 Procedure and treatment not carried out due to patient leaving prior to being seen by health care provider: Secondary | ICD-10-CM | POA: Diagnosis not present

## 2019-12-22 DIAGNOSIS — R102 Pelvic and perineal pain: Secondary | ICD-10-CM | POA: Diagnosis not present

## 2019-12-22 DIAGNOSIS — R531 Weakness: Secondary | ICD-10-CM | POA: Insufficient documentation

## 2019-12-22 DIAGNOSIS — N63 Unspecified lump in unspecified breast: Secondary | ICD-10-CM | POA: Diagnosis not present

## 2019-12-22 LAB — BASIC METABOLIC PANEL
Anion gap: 9 (ref 5–15)
BUN: 11 mg/dL (ref 6–20)
CO2: 23 mmol/L (ref 22–32)
Calcium: 9.5 mg/dL (ref 8.9–10.3)
Chloride: 97 mmol/L — ABNORMAL LOW (ref 98–111)
Creatinine, Ser: 0.62 mg/dL (ref 0.44–1.00)
GFR calc Af Amer: >60 mL/min (ref >60)
GFR calc non Af Amer: >60 mL/min (ref >60)
Glucose, Bld: 378 mg/dL — ABNORMAL HIGH (ref 70–99)
Potassium: 4.5 mmol/L (ref 3.5–5.1)
Sodium: 129 mmol/L — ABNORMAL LOW (ref 135–145)

## 2019-12-22 LAB — CBC
HCT: 40.4 % (ref 36.0–46.0)
Hemoglobin: 13.3 g/dL (ref 12.0–15.0)
MCH: 26.3 pg (ref 26.0–34.0)
MCHC: 32.9 g/dL (ref 30.0–36.0)
MCV: 80 fL (ref 80.0–100.0)
Platelets: 404 10*3/uL — ABNORMAL HIGH (ref 150–400)
RBC: 5.05 MIL/uL (ref 3.87–5.11)
RDW: 13.4 % (ref 11.5–15.5)
WBC: 8.4 10*3/uL (ref 4.0–10.5)
nRBC: 0 % (ref 0.0–0.2)

## 2019-12-22 LAB — I-STAT BETA HCG BLOOD, ED (MC, WL, AP ONLY): I-stat hCG, quantitative: <5 m[IU]/mL (ref <5)

## 2019-12-22 NOTE — ED Triage Notes (Signed)
Pt reports R breast knot and vaginal pain that she states she has been seen in ED before for same.  Denies discharge.  Also reports generalized weakness that started today.

## 2019-12-22 NOTE — ED Notes (Signed)
Pt left due to not being seen quick enough 

## 2019-12-23 ENCOUNTER — Encounter (HOSPITAL_COMMUNITY): Payer: Self-pay | Admitting: Emergency Medicine

## 2019-12-23 ENCOUNTER — Ambulatory Visit (HOSPITAL_COMMUNITY)
Admission: EM | Admit: 2019-12-23 | Discharge: 2019-12-23 | Disposition: A | Discharge status: Home / Self Care | Payer: Medicare HMO | Attending: Family Medicine | Admitting: Family Medicine

## 2019-12-23 ENCOUNTER — Other Ambulatory Visit: Payer: Self-pay

## 2019-12-23 DIAGNOSIS — B372 Candidiasis of skin and nail: Secondary | ICD-10-CM

## 2019-12-23 DIAGNOSIS — N63 Unspecified lump in unspecified breast: Secondary | ICD-10-CM

## 2019-12-23 DIAGNOSIS — M79673 Pain in unspecified foot: Secondary | ICD-10-CM

## 2019-12-23 MED ORDER — KETOCONAZOLE 2 % EX CREA: 1.0000 "application " | TOPICAL_CREAM | Freq: Every day | CUTANEOUS | 0 refills | Status: AC

## 2019-12-23 NOTE — ED Triage Notes (Signed)
Pt presents with multiple concerns. States she has had vaginal irritation. Denies any dysuria, abdominal pain, lower back pain.   C/o of knot on right breast xs 3-4 days ago. States she tried to pop the bump and "it burst on the inside of breast"  States she needs referral to [facility] for bone spurs.

## 2019-12-23 NOTE — ED Provider Notes (Signed)
[place]    CSN: [id number] Arrival date & time: 12/23/19  7425      History   Chief Complaint Chief Complaint  Patient presents with  . Breast Problem    HPI Evelyn Watts is a 44 y.o. female with a history of asthma and type 2 diabetes presenting for evaluation of multiple complaints.  Main concern is a bump on her right breast that she noticed pop up about 4-5 days ago.  She states initially the bump felt like a pimple and she pressed on it to see if anything would come out, however felt like it drained internally.  Does have some pain in the region.  Denies any overlying skin erythema, drainage, fever, fatigue.  Has not had anything in this region before.  Additionally reports rash in her groin.  This is intermittently present, most recently for the past month.  She previously was treated with Diflucan and nystatin which helped some but came right back.  She has been using baby powder to keep the area dry.  Does have diabetes with her sugars in the 200-300 at home while on Metformin only.  States her insurance canceled her prescription for Trulicity.  No vaginal discharge or itching, dysuria.   Lastly, requests referral to [facility] for her chronic dorsal foot pain present for the past several years.  States she has been diagnosed with bone spurs and hammertoe in the past and would like to be evaluated by a podiatrist.  Was told that she needed a referral when she called [facility].  No recent increase in severity or frequency of pain.   Past Medical History:  Diagnosis Date  . Asthma   . Diabetes mellitus without complication [facility])     Patient Active Problem List   Diagnosis Date Noted  . Type 2 diabetes mellitus with hyperglycemia, with long-term current use of insulin ([place]) 02/14/2018  . Diabetes mellitus without complication ([place]) 95/63/8756  . Moderate episode of recurrent major depressive disorder ([place]) 05/24/2016  . Morbid obesity ([place]) 05/24/2016     Past Surgical History:  Procedure Laterality Date  . CESAREAN SECTION     4  . CHOLECYSTECTOMY    . HERNIA REPAIR    . TONSILLECTOMY      OB History   No obstetric history on file.      Home Medications    Prior to Admission medications   Medication Sig Start Date End Date Taking? Authorizing Provider  metFORMIN (GLUCOPHAGE) 500 MG tablet Take 1,000 mg by mouth 2 (two) times daily with a meal.   Yes [provider]  atorvastatin (LIPITOR) 20 MG tablet Take 1 tablet (20 mg total) by mouth daily. Patient not taking: Reported on 02/09/2019 02/14/18   Gildardo Pounds, NP  blood glucose meter kit and supplies KIT Dispense based on patient and insurance preference. Use up to four times daily as directed. (FOR ICD-9 250.00, 250.01). 10/10/18   Couture, Cortni S, PA-C  clotrimazole-betamethasone (LOTRISONE) cream Apply to affected area 2 times daily prn Patient not taking: Reported on 11/08/2019 08/24/19   Sherwood Gambler, MD  erythromycin ophthalmic ointment Place 1 application into the left eye every 6 (six) hours. Place 1/2 inch ribbon of ointment in the affected eye 4 times a day Patient not taking: Reported on 11/08/2019 02/09/19   Margarita Mail, PA-C  fluconazole (DIFLUCAN) 150 MG tablet Take one tab on 7/19 and one tab on 7/22 11/08/19   Robinson, [place] N, PA-C  glucose blood test strip Use as instructed. Check blood glucose levels by fingerstick twice per day 11/08/19   Robinson, [place] N, PA-C  ibuprofen (ADVIL) 200 MG tablet Take 400 mg by mouth every 6 (six) hours as needed for moderate pain.    [provider]  insulin glargine (LANTUS) 100 UNIT/ML injection Inject 0.3 mLs (30 Units total) into the skin 2 (two) times daily. 08/24/19   Sherwood Gambler, MD  ketoconazole (NIZORAL) 2 % cream Apply 1 application topically daily for 14 days. 12/23/19 01/06/20  Patriciaann Clan, DO  liraglutide (VICTOZA) 18 MG/3ML SOPN Inject 0.3 mLs (1.8 mg total) into the skin daily.  08/24/19 09/23/19  Sherwood Gambler, MD  liraglutide (VICTOZA) 18 MG/3ML SOPN Inject 1.8 mg into the skin daily.    [provider]  lisinopril (PRINIVIL,ZESTRIL) 20 MG tablet Take 1 tablet (20 mg total) by mouth daily. 02/14/18   Gildardo Pounds, NP  meloxicam (MOBIC) 15 MG tablet Take 1 tablet (15 mg total) by mouth daily. Patient not taking: Reported on 02/09/2019 03/13/18   Landis Martins, DPM  meloxicam (MOBIC) 15 MG tablet Take 1 tablet (15 mg total) by mouth daily. Patient not taking: Reported on 02/09/2019 03/13/18   Landis Martins, DPM  metFORMIN (GLUCOPHAGE) 500 MG tablet Take 2 tablets (1,000 mg total) by mouth 2 (two) times daily with a meal. 08/24/19 10/23/19  Sherwood Gambler, MD  Misc. Devices MISC Please provide patient with glucerna for nutritional support. 02/14/18   Gildardo Pounds, NP  naproxen (NAPROSYN) 375 MG tablet Take 1 tablet (375 mg total) by mouth 2 (two) times daily. Patient not taking: Reported on 02/09/2019 03/19/18   Wurst, [place], PA-C  nystatin (MYCOSTATIN/NYSTOP) powder Apply 1 application topically 3 (three) times daily. 11/08/19   Robinson, [place] N, PA-C  VENTOLIN HFA 108 (90 Base) MCG/ACT inhaler INHALE ONE TO TWO PUFFS INTO THE LUNGS EVERY 6 HOURS AS NEEDED FOR WHEEZING OR SHORTNESS OF BREATH Patient taking differently: Inhale 2 puffs into the lungs every 4 (four) hours as needed for wheezing.  04/13/18   Gildardo Pounds, NP    Family History Family History  Problem Relation Age of Onset  . Healthy Mother   . Healthy Father   . Hypertension Neg Hx   . Diabetes Neg Hx     Social History Social History   Tobacco Use  . Smoking status: Never Smoker  . Smokeless tobacco: Never Used  Vaping Use  . Vaping Use: Never used  Substance Use Topics  . Alcohol use: No  . Drug use: No     Allergies   Latex, Morphine and related, and Tape   Review of Systems Review of Systems  Constitutional: Negative for chills, fatigue and fever.    Respiratory: Negative for shortness of breath.   Cardiovascular: Negative for chest pain.  Gastrointestinal: Negative for abdominal pain, nausea and vomiting.  Genitourinary: Negative for dysuria, vaginal bleeding and vaginal discharge.  Skin: Positive for rash.    Physical Exam Triage Vital Signs ED Triage Vitals  Enc Vitals Group     BP 12/23/19 0931 128/86     Pulse Rate 12/23/19 0931 (!) 109     Resp 12/23/19 0931 19     Temp 12/23/19 0931 98.6 F (37 C)     Temp Source 12/23/19 0931 Oral     SpO2 12/23/19 0931 94 %     Weight --      Height --      Head  Circumference --      Peak Flow --      Pain Score 12/23/19 0928 10     Pain Loc --      Pain Edu? --      Excl. in [place]? --    No data found.  Updated Vital Signs BP 128/86 (BP Location: Right Arm)   Pulse (!) 109   Temp 98.6 F (37 C) (Oral)   Resp 19   LMP 12/01/2019   SpO2 94%   Physical Exam Constitutional:      General: She is not in acute distress.    Appearance: Normal appearance. She is not ill-appearing.  HENT:     Head: Normocephalic and atraumatic.     Mouth/Throat:     Mouth: Mucous membranes are moist.  Pulmonary:     Effort: Pulmonary effort is normal.  Chest:    Musculoskeletal:     Right lower leg: No edema.     Left lower leg: No edema.     Comments: Mild tenderness to palpation of dorsal foot around navicular/anterior calcaneal bone bilaterally, L>R.  No overt deformity, erythema, or bruising noted.  Normal gait.  Lymphadenopathy:     Upper Body:     Right upper body: No axillary adenopathy.     Left upper body: No axillary adenopathy.  Skin:    General: Skin is warm and dry.     Capillary Refill: Capillary refill takes less than 2 seconds.     Comments: Well demarcated erythematous rash with few satellite lesions present on bilateral groin and vulva.  White powder present on top.  Neurological:     Mental Status: She is alert and oriented to person, place, and time.   Psychiatric:        Mood and Affect: Mood normal.        Behavior: Behavior normal.      UC Treatments / Results  Labs (all labs ordered are listed, but only abnormal results are displayed) Labs Reviewed - No data to display  EKG   Radiology No results found.  Procedures Procedures (including critical care time)  Medications Ordered in UC Medications - No data to display  Initial Impression / Assessment and Plan / UC Course  I have reviewed the triage vital signs and the nursing notes.  Pertinent labs & imaging results that were available during my care of the patient were reviewed by me and considered in my medical decision making (see chart for details).   44 year old female with a history of T2DM presenting for evaluation of a breast lump and groin rash.  She does appear to have a 2x2 cm mildly fluctuant structure on her anterior right upper inner breast without any overlying erythema or warmth to touch.  Likely breast cyst, could also consider subcutaneous abscess without any s/sx of infection.  Will proceed with breast U/S at imaging center for further evaluation.  No indication for antibiotics today.  Additionally, experiences recurrent suspected candidiasis/fungal intertrigo due to body habitus and poorly controlled diabetes.  Rx'd ketoconazole cream x 2 weeks in addition to continued use of barrier powder for dryness.  May need prolonged oral course in the future if not improving.  Adamantly encouraged establishing with primary care provider as soon as possible for chronic conditions.  Instructed to call her insurance for her Trulicity alternative to maintain her diabetic management.  Reassuringly had labs in ED yesterday (left before being seen) without evidence of DKA/HHS.  Provided with referral for [facility] due to chronic bone spurs.  F/U if the above not improving or sooner if any development of fever, enlargement of breast mass with s/sx of infection, or fatigue.    Final Clinical Impressions(s) / UC Diagnoses   Final diagnoses:  Candidal intertrigo  Breast lump in upper inner quadrant  Chronic foot pain, unspecified laterality     Discharge Instructions     It was wonderful meeting you today.   For the lump on your breast: We have placed an order to send you to the breast center of [facility] for an ultrasound to further assess this area.  Phone number [phone].  If you start noticing skin redness that is spreading, worsening pain or enlargement of the area, fever, or any drainage please follow-up.  For your feet: We have placed referral to [facility].  Please call them back and see if he can get scheduled for an appointment.  For your rash: I have sent in a fungal cream for you to use in the area daily for at least the next 2 weeks.  You can continue to use the powder on top of it to keep the area dry.  It is very important that you get your diabetes under control and keep this area dry to also treat this.  Please establish with a primary care provider as soon as possible.    ED Prescriptions    Medication Sig Dispense Auth. Provider   ketoconazole (NIZORAL) 2 % cream Apply 1 application topically daily for 14 days. 30 g Patriciaann Clan, DO     PDMP not reviewed this encounter.   Patriciaann Clan, DO 12/23/19 1720

## 2019-12-23 NOTE — Discharge Instructions (Signed)
It was wonderful meeting you today.   For the lump on your breast: We have placed an order to send you to the breast center of [facility] for an ultrasound to further assess this area.  Phone number [phone].  If you start noticing skin redness that is spreading, worsening pain or enlargement of the area, fever, or any drainage please follow-up.  For your feet: We have placed referral to [facility].  Please call them back and see if he can get scheduled for an appointment.  For your rash: I have sent in a fungal cream for you to use in the area daily for at least the next 2 weeks.  You can continue to use the powder on top of it to keep the area dry.  It is very important that you get your diabetes under control and keep this area dry to also treat this.  Please establish with a primary care provider as soon as possible.

## 2020-01-05 ENCOUNTER — Encounter (HOSPITAL_COMMUNITY): Payer: Self-pay | Admitting: Emergency Medicine

## 2020-01-05 ENCOUNTER — Emergency Department (HOSPITAL_COMMUNITY)
Admission: EM | Admit: 2020-01-05 | Discharge: 2020-01-05 | Disposition: A | Discharge status: Home / Self Care | Payer: Medicare HMO | Attending: Emergency Medicine | Admitting: Emergency Medicine

## 2020-01-05 ENCOUNTER — Emergency Department (HOSPITAL_COMMUNITY): Payer: Medicare HMO

## 2020-01-05 DIAGNOSIS — J45909 Unspecified asthma, uncomplicated: Secondary | ICD-10-CM | POA: Diagnosis not present

## 2020-01-05 DIAGNOSIS — Z7951 Long term (current) use of inhaled steroids: Secondary | ICD-10-CM | POA: Insufficient documentation

## 2020-01-05 DIAGNOSIS — N644 Mastodynia: Secondary | ICD-10-CM | POA: Diagnosis not present

## 2020-01-05 DIAGNOSIS — Z794 Long term (current) use of insulin: Secondary | ICD-10-CM | POA: Diagnosis not present

## 2020-01-05 DIAGNOSIS — Z91040 Latex allergy status: Secondary | ICD-10-CM | POA: Insufficient documentation

## 2020-01-05 DIAGNOSIS — Z79899 Other long term (current) drug therapy: Secondary | ICD-10-CM | POA: Diagnosis not present

## 2020-01-05 DIAGNOSIS — E1165 Type 2 diabetes mellitus with hyperglycemia: Secondary | ICD-10-CM | POA: Insufficient documentation

## 2020-01-05 DIAGNOSIS — R079 Chest pain, unspecified: Secondary | ICD-10-CM | POA: Diagnosis present

## 2020-01-05 LAB — BASIC METABOLIC PANEL
Anion gap: 11 (ref 5–15)
BUN: 15 mg/dL (ref 6–20)
CO2: 24 mmol/L (ref 22–32)
Calcium: 9.7 mg/dL (ref 8.9–10.3)
Chloride: 99 mmol/L (ref 98–111)
Creatinine, Ser: 0.61 mg/dL (ref 0.44–1.00)
GFR calc Af Amer: >60 mL/min (ref >60)
GFR calc non Af Amer: >60 mL/min (ref >60)
Glucose, Bld: 345 mg/dL — ABNORMAL HIGH (ref 70–99)
Potassium: 4.3 mmol/L (ref 3.5–5.1)
Sodium: 134 mmol/L — ABNORMAL LOW (ref 135–145)

## 2020-01-05 LAB — CBC
HCT: 39 % (ref 36.0–46.0)
Hemoglobin: 13.3 g/dL (ref 12.0–15.0)
MCH: 27.1 pg (ref 26.0–34.0)
MCHC: 34.1 g/dL (ref 30.0–36.0)
MCV: 79.4 fL — ABNORMAL LOW (ref 80.0–100.0)
Platelets: 464 10*3/uL — ABNORMAL HIGH (ref 150–400)
RBC: 4.91 MIL/uL (ref 3.87–5.11)
RDW: 13.6 % (ref 11.5–15.5)
WBC: 7.2 10*3/uL (ref 4.0–10.5)
nRBC: 0 % (ref 0.0–0.2)

## 2020-01-05 LAB — I-STAT BETA HCG BLOOD, ED (NOT ORDERABLE): I-stat hCG, quantitative: <5 m[IU]/mL (ref <5)

## 2020-01-05 LAB — CBG MONITORING, ED: Glucose-Capillary: 383 mg/dL — ABNORMAL HIGH (ref 70–99)

## 2020-01-05 LAB — TROPONIN I (HIGH SENSITIVITY): Troponin I (High Sensitivity): 3 ng/L (ref <18)

## 2020-01-05 MED ORDER — METFORMIN HCL 500 MG PO TABS: 1000.0000 mg | ORAL_TABLET | Freq: Two times a day (BID) | ORAL | 1 refills | Status: DC

## 2020-01-05 MED ORDER — VICTOZA 18 MG/3ML ~~LOC~~ SOPN: 1.8000 mg | PEN_INJECTOR | Freq: Every day | SUBCUTANEOUS | 3 refills | Status: DC

## 2020-01-05 NOTE — Discharge Instructions (Signed)
As discussed, it is very important that he follow-up with both at our [facility] for primary care follow-up, and at the breast Center of [name] for imaging to discern if your breast lesion requires intervention.  Return here for concerning changes in your condition.

## 2020-01-05 NOTE — ED Triage Notes (Signed)
Patient here from home reporting chest pain and hyperglycemia. Chest pain radiating into back that started 3 days ago. Hyperglycemia although she has not eaten today. Hx of diabetes. CBG 415.

## 2020-01-05 NOTE — ED Provider Notes (Signed)
[place] DEPT Provider Note   CSN: [id number] Arrival date & time: 01/05/20  1110     History Chief Complaint  Patient presents with  . Chest Pain  . Hyperglycemia    Evelyn Watts is a 44 y.o. female.  HPI    Patient presents with concern of ongoing pain around a focal lesion in the right medial superior aspect of her breast. It began about 3 weeks ago, she was seen and evaluated in urgent care 2 weeks ago.  She notes that she was seen at that point, discharged with instructions to follow-up at the breast center for additional imaging, evaluation, but has not yet completed that task. She states that she has not received a phone call to expect this. Patient also states that she ran out of her diabetes medication. She notes pain focally around the right upper breast, but no left-sided chest pain, no other chest pain, no dyspnea, no fever, no other new complaints. The pain is sore, moderate, persistent.  No clear alleviating or exacerbating factors.  Past Medical History:  Diagnosis Date  . Asthma   . Diabetes mellitus without complication [facility])     Patient Active Problem List   Diagnosis Date Noted  . Type 2 diabetes mellitus with hyperglycemia, with long-term current use of insulin ([place]) 02/14/2018  . Diabetes mellitus without complication ([place]) 76/73/4193  . Moderate episode of recurrent major depressive disorder ([place]) 05/24/2016  . Morbid obesity ([place]) 05/24/2016    Past Surgical History:  Procedure Laterality Date  . CESAREAN SECTION     4  . CHOLECYSTECTOMY    . HERNIA REPAIR    . TONSILLECTOMY       OB History   No obstetric history on file.     Family History  Problem Relation Age of Onset  . Healthy Mother   . Healthy Father   . Hypertension Neg Hx   . Diabetes Neg Hx     Social History   Tobacco Use  . Smoking status: Never Smoker  . Smokeless tobacco: Never Used  Vaping Use  . Vaping Use: Never used    Substance Use Topics  . Alcohol use: No  . Drug use: No    Home Medications Prior to Admission medications   Medication Sig Start Date End Date Taking? Authorizing Provider  atorvastatin (LIPITOR) 20 MG tablet Take 1 tablet (20 mg total) by mouth daily. Patient not taking: Reported on 02/09/2019 02/14/18   Gildardo Pounds, NP  blood glucose meter kit and supplies KIT Dispense based on patient and insurance preference. Use up to four times daily as directed. (FOR ICD-9 250.00, 250.01). 10/10/18   Couture, Cortni S, PA-C  clotrimazole-betamethasone (LOTRISONE) cream Apply to affected area 2 times daily prn Patient not taking: Reported on 11/08/2019 08/24/19   Sherwood Gambler, MD  erythromycin ophthalmic ointment Place 1 application into the left eye every 6 (six) hours. Place 1/2 inch ribbon of ointment in the affected eye 4 times a day Patient not taking: Reported on 11/08/2019 02/09/19   Margarita Mail, PA-C  fluconazole (DIFLUCAN) 150 MG tablet Take one tab on 7/19 and one tab on 7/22 11/08/19   Robinson, [place] N, PA-C  glucose blood test strip Use as instructed. Check blood glucose levels by fingerstick twice per day 11/08/19   Robinson, [place] N, PA-C  ibuprofen (ADVIL) 200 MG tablet Take 400 mg by mouth every 6 (six) hours as needed for moderate pain.    [provider]  insulin glargine (LANTUS) 100 UNIT/ML injection Inject 0.3 mLs (30 Units total) into the skin 2 (two) times daily. 08/24/19   Sherwood Gambler, MD  ketoconazole (NIZORAL) 2 % cream Apply 1 application topically daily for 14 days. 12/23/19 01/06/20  Patriciaann Clan, DO  liraglutide (VICTOZA) 18 MG/3ML SOPN Inject 1.8 mg into the skin daily.    [provider]  liraglutide (VICTOZA) 18 MG/3ML SOPN Inject 1.8 mg into the skin daily. 01/05/20 02/04/20  Carmin Muskrat, MD  lisinopril (PRINIVIL,ZESTRIL) 20 MG tablet Take 1 tablet (20 mg total) by mouth daily. 02/14/18   Gildardo Pounds, NP  meloxicam (MOBIC) 15 MG  tablet Take 1 tablet (15 mg total) by mouth daily. Patient not taking: Reported on 02/09/2019 03/13/18   Landis Martins, DPM  meloxicam (MOBIC) 15 MG tablet Take 1 tablet (15 mg total) by mouth daily. Patient not taking: Reported on 02/09/2019 03/13/18   Landis Martins, DPM  metFORMIN (GLUCOPHAGE) 500 MG tablet Take 1,000 mg by mouth 2 (two) times daily with a meal.    [provider]  metFORMIN (GLUCOPHAGE) 500 MG tablet Take 2 tablets (1,000 mg total) by mouth 2 (two) times daily with a meal. 01/05/20 02/04/20  Carmin Muskrat, MD  Misc. Devices MISC Please provide patient with glucerna for nutritional support. 02/14/18   Gildardo Pounds, NP  naproxen (NAPROSYN) 375 MG tablet Take 1 tablet (375 mg total) by mouth 2 (two) times daily. Patient not taking: Reported on 02/09/2019 03/19/18   Wurst, [place], PA-C  nystatin (MYCOSTATIN/NYSTOP) powder Apply 1 application topically 3 (three) times daily. 11/08/19   Robinson, [place] N, PA-C  VENTOLIN HFA 108 (90 Base) MCG/ACT inhaler INHALE ONE TO TWO PUFFS INTO THE LUNGS EVERY 6 HOURS AS NEEDED FOR WHEEZING OR SHORTNESS OF BREATH Patient taking differently: Inhale 2 puffs into the lungs every 4 (four) hours as needed for wheezing.  04/13/18   Gildardo Pounds, NP    Allergies    Latex, Morphine and related, and Tape  Review of Systems   Review of Systems  Constitutional:       Per HPI, otherwise negative  HENT:       Per HPI, otherwise negative  Respiratory:       Per HPI, otherwise negative  Cardiovascular:       Per HPI, otherwise negative  Gastrointestinal: Negative for vomiting.  Endocrine:       Negative aside from HPI  Genitourinary:       Neg aside from HPI   Musculoskeletal:       Per HPI, otherwise negative  Skin: Positive for color change and wound.  Neurological: Negative for syncope.    Physical Exam Updated Vital Signs BP (!) 137/92 (BP Location: Left Arm)   Pulse 92   Temp 98.4 F (36.9 C) (Oral)   Resp  16   SpO2 96%   Physical Exam Vitals and nursing note reviewed.  Constitutional:      General: She is not in acute distress.    Appearance: She is well-developed.  HENT:     Head: Normocephalic and atraumatic.  Eyes:     Conjunctiva/sclera: Conjunctivae normal.  Cardiovascular:     Rate and Rhythm: Normal rate and regular rhythm.  Pulmonary:     Effort: Pulmonary effort is normal. No respiratory distress.     Breath sounds: Normal breath sounds. No stridor.  Chest:    Abdominal:     General: There is no distension.  Skin:  General: Skin is warm and dry.  Neurological:     Mental Status: She is alert and oriented to person, place, and time.     Cranial Nerves: No cranial nerve deficit.     ED Results / Procedures / Treatments   Labs (all labs ordered are listed, but only abnormal results are displayed) Labs Reviewed  BASIC METABOLIC PANEL - Abnormal; Notable for the following components:      Result Value   Sodium 134 (*)    Glucose, Bld 345 (*)    All other components within normal limits  CBC - Abnormal; Notable for the following components:   MCV 79.4 (*)    Platelets 464 (*)    All other components within normal limits  CBG MONITORING, ED - Abnormal; Notable for the following components:   Glucose-Capillary 383 (*)    All other components within normal limits  I-STAT BETA HCG BLOOD, ED ([facility], [facility], [facility] ONLY)  I-STAT BETA HCG BLOOD, ED (NOT ORDERABLE)  TROPONIN I (HIGH SENSITIVITY)  TROPONIN I (HIGH SENSITIVITY)    EKG EKG Interpretation  Date/Time:  'Sunday January 05 2020 11:24:06 EDT Ventricular Rate:  91 PR Interval:    QRS Duration: 91 QT Interval:  370 QTC Calculation: 456 R Axis:   -5 Text Interpretation: Sinus rhythm No significant change since last tracing Confirmed by Sheldon, Charles ([id number]) on 01/05/2020 12:37:37 PM   Radiology DG Chest 2 View  Result Date: 01/05/2020 CLINICAL DATA:  Chest pain and hyperglycemia EXAM: CHEST - 2 VIEW  COMPARISON:  Aug 24, 2019 FINDINGS: The lungs are clear. The heart size and pulmonary vascularity are normal. No adenopathy. No pneumothorax. No bone lesions. IMPRESSION: Lungs clear.  Cardiac silhouette normal. Electronically Signed   By: William  Woodruff III M.D.   On: 01/05/2020 13:44    Procedures Procedures (including critical care time)  Medications Ordered in ED Medications - No data to display  ED Course  I have reviewed the triage vital signs and the nursing notes.  Pertinent labs & imaging results that were available during my care of the patient were reviewed by me and considered in my medical decision making (see chart for details).     Chart review after initial evaluation notable for urgent care notes from 2 weeks ago suggesting need for outpatient breast center follow-up, which has not yet been completed.  This adult female presents with ongoing concern of a lesion that was first evaluated 2 weeks ago, having not had follow-up in the interval.  On here she is awake, alert, afebrile, no evidence for other acute new pathology, with reassuring EKG, labs.  Low suspicion for PE given the absence of dyspnea, other reassuring findings.   Final Clinical Impression(s) / ED Diagnoses Final diagnoses:  Breast pain    Rx / DC Orders ED Discharge Orders         Ordered    liraglutide (VICTOZA) 18 MG/3ML SOPN  Daily        01/05/20 1759    metFORMIN (GLUCOPHAGE) 500 MG tablet  2 times daily with meals        09' /12/21 1759           Carmin Muskrat, MD 01/05/20 2322

## 2020-01-09 ENCOUNTER — Ambulatory Visit: Payer: Medicare HMO | Admitting: Sports Medicine

## 2020-01-13 ENCOUNTER — Telehealth (HOSPITAL_COMMUNITY): Payer: Self-pay | Admitting: Family Medicine

## 2020-01-13 ENCOUNTER — Other Ambulatory Visit (HOSPITAL_COMMUNITY): Payer: Self-pay | Admitting: Family Medicine

## 2020-01-13 DIAGNOSIS — N63 Unspecified lump in unspecified breast: Secondary | ICD-10-CM

## 2020-01-13 NOTE — Telephone Encounter (Signed)
Patient seen here at [facility] urgent care on 12/23/2019 was referred over to the breast center however orders were not placed for diagnostic imaging during that visit. Orders have been placed for both diagnostic imaging and ultrasound involving the left breast. Patient access personnel has notified patient to follow-up with the breast center to have appointment scheduled for follow-up.

## 2020-01-16 ENCOUNTER — Other Ambulatory Visit (HOSPITAL_COMMUNITY): Payer: Self-pay | Admitting: Family Medicine

## 2020-01-16 ENCOUNTER — Ambulatory Visit
Admission: RE | Admit: 2020-01-16 | Discharge: 2020-01-16 | Disposition: A | Discharge status: Home / Self Care | Payer: Medicare HMO | Source: Ambulatory Visit | Attending: Family Medicine | Admitting: Family Medicine

## 2020-01-16 ENCOUNTER — Other Ambulatory Visit: Payer: Self-pay

## 2020-01-16 DIAGNOSIS — N63 Unspecified lump in unspecified breast: Secondary | ICD-10-CM

## 2020-01-16 DIAGNOSIS — N6489 Other specified disorders of breast: Secondary | ICD-10-CM

## 2020-01-19 ENCOUNTER — Emergency Department (HOSPITAL_COMMUNITY): Payer: Medicare HMO

## 2020-01-19 ENCOUNTER — Other Ambulatory Visit: Payer: Self-pay

## 2020-01-19 ENCOUNTER — Emergency Department (HOSPITAL_COMMUNITY)
Admission: EM | Admit: 2020-01-19 | Discharge: 2020-01-19 | Disposition: A | Discharge status: Home / Self Care | Payer: Medicare HMO | Attending: Emergency Medicine | Admitting: Emergency Medicine

## 2020-01-19 DIAGNOSIS — U071 COVID-19: Secondary | ICD-10-CM | POA: Insufficient documentation

## 2020-01-19 DIAGNOSIS — Z79899 Other long term (current) drug therapy: Secondary | ICD-10-CM | POA: Diagnosis not present

## 2020-01-19 DIAGNOSIS — I10 Essential (primary) hypertension: Secondary | ICD-10-CM | POA: Insufficient documentation

## 2020-01-19 DIAGNOSIS — J45909 Unspecified asthma, uncomplicated: Secondary | ICD-10-CM | POA: Diagnosis not present

## 2020-01-19 DIAGNOSIS — Z794 Long term (current) use of insulin: Secondary | ICD-10-CM | POA: Diagnosis not present

## 2020-01-19 DIAGNOSIS — E1165 Type 2 diabetes mellitus with hyperglycemia: Secondary | ICD-10-CM | POA: Diagnosis not present

## 2020-01-19 DIAGNOSIS — R739 Hyperglycemia, unspecified: Secondary | ICD-10-CM

## 2020-01-19 DIAGNOSIS — Z20822 Contact with and (suspected) exposure to covid-19: Secondary | ICD-10-CM | POA: Diagnosis present

## 2020-01-19 LAB — BASIC METABOLIC PANEL
Anion gap: 13 (ref 5–15)
BUN: 6 mg/dL (ref 6–20)
CO2: 25 mmol/L (ref 22–32)
Calcium: 9.3 mg/dL (ref 8.9–10.3)
Chloride: 97 mmol/L — ABNORMAL LOW (ref 98–111)
Creatinine, Ser: 0.62 mg/dL (ref 0.44–1.00)
GFR calc Af Amer: >60 mL/min (ref >60)
GFR calc non Af Amer: >60 mL/min (ref >60)
Glucose, Bld: 210 mg/dL — ABNORMAL HIGH (ref 70–99)
Potassium: 3.5 mmol/L (ref 3.5–5.1)
Sodium: 135 mmol/L (ref 135–145)

## 2020-01-19 LAB — CBC WITH DIFFERENTIAL/PLATELET
Abs Immature Granulocytes: 0.01 10*3/uL (ref 0.00–0.07)
Basophils Absolute: 0 10*3/uL (ref 0.0–0.1)
Basophils Relative: 0 %
Eosinophils Absolute: 0 10*3/uL (ref 0.0–0.5)
Eosinophils Relative: 0 %
HCT: 38.5 % (ref 36.0–46.0)
Hemoglobin: 13.3 g/dL (ref 12.0–15.0)
Immature Granulocytes: 0 %
Lymphocytes Relative: 17 %
Lymphs Abs: 1 10*3/uL (ref 0.7–4.0)
MCH: 27 pg (ref 26.0–34.0)
MCHC: 34.5 g/dL (ref 30.0–36.0)
MCV: 78.1 fL — ABNORMAL LOW (ref 80.0–100.0)
Monocytes Absolute: 0.5 10*3/uL (ref 0.1–1.0)
Monocytes Relative: 9 %
Neutro Abs: 4.2 10*3/uL (ref 1.7–7.7)
Neutrophils Relative %: 74 %
Platelets: 315 10*3/uL (ref 150–400)
RBC: 4.93 MIL/uL (ref 3.87–5.11)
RDW: 13.3 % (ref 11.5–15.5)
WBC: 5.8 10*3/uL (ref 4.0–10.5)
nRBC: 0 % (ref 0.0–0.2)

## 2020-01-19 LAB — RESPIRATORY PANEL BY RT PCR (FLU A&B, COVID)
Influenza A by PCR: NEGATIVE
Influenza B by PCR: NEGATIVE
SARS Coronavirus 2 by RT PCR: POSITIVE — AB

## 2020-01-19 MED ORDER — ACETAMINOPHEN 500 MG PO TABS
1000.0000 mg | ORAL_TABLET | Freq: Once | ORAL | Status: AC
  Administered 2020-01-19: 1000 mg via ORAL
  Filled 2020-01-19: qty 2

## 2020-01-19 MED ORDER — SODIUM CHLORIDE 0.9 % IV BOLUS
1000.0000 mL | Freq: Once | INTRAVENOUS | Status: AC
  Administered 2020-01-19: 1000 mL via INTRAVENOUS

## 2020-01-19 NOTE — ED Provider Notes (Signed)
[facility] DEPT Provider Note   CSN: [id number] Arrival date & time: 01/19/20  1858     History Chief Complaint  Patient presents with  . Covid Exposure    Jennavecia Schwier is a 44 y.o. female.  Chalsey Leeth is a 44 y.o. female with a history of diabetes, hypertension and asthma, who presents to the emergency department for evaluation of Covid exposure.  Patient states that 8 days ago she and her husband were exposed to Covid.  After the exposure they immediately drove and got the first dose of the Pfizer vaccine, but both of them started experiencing symptoms about 3 days ago.  Patient has had chills, and has a low-grade fever here in the emergency department.  She also reports fatigue and very poor appetite, some nausea but no vomiting or diarrhea.  States she has had a frequent cough and nasal congestion.  She denies chest pain or shortness of breath.  Reports that her taste is not normal but she can still taste things.  Denies any abdominal pain.  Has not taken any medications to treat her symptoms prior to arrival.  Patient states she is very worried that she is getting dehydrated and that this is going to affect her diabetes.  She is not been able to eat much but is still been taking her medications.        Past Medical History:  Diagnosis Date  . Asthma   . Diabetes mellitus without complication [facility])     Patient Active Problem List   Diagnosis Date Noted  . Essential hypertension 01/20/2020  . COVID-19 virus infection 01/20/2020  . Type 2 diabetes mellitus with hyperglycemia, with long-term current use of insulin ([place]) 02/14/2018  . Diabetes mellitus without complication ([place]) 39/06/90  . Moderate episode of recurrent major depressive disorder ([place]) 05/24/2016  . Morbid obesity ([place]) 05/24/2016    Past Surgical History:  Procedure Laterality Date  . CESAREAN SECTION     4  . CHOLECYSTECTOMY    . HERNIA REPAIR    . TONSILLECTOMY        OB History   No obstetric history on file.     Family History  Problem Relation Age of Onset  . Healthy Mother   . Healthy Father   . Hypertension Neg Hx   . Diabetes Neg Hx     Social History   Tobacco Use  . Smoking status: Never Smoker  . Smokeless tobacco: Never Used  Vaping Use  . Vaping Use: Never used  Substance Use Topics  . Alcohol use: No  . Drug use: No    Home Medications Prior to Admission medications   Medication Sig Start Date End Date Taking? Authorizing Provider  atorvastatin (LIPITOR) 20 MG tablet Take 1 tablet (20 mg total) by mouth daily. Patient not taking: Reported on 02/09/2019 02/14/18   Gildardo Pounds, NP  blood glucose meter kit and supplies KIT Dispense based on patient and insurance preference. Use up to four times daily as directed. (FOR ICD-9 250.00, 250.01). 10/10/18   Couture, Cortni S, PA-C  clotrimazole-betamethasone (LOTRISONE) cream Apply to affected area 2 times daily prn Patient not taking: Reported on 11/08/2019 08/24/19   Sherwood Gambler, MD  erythromycin ophthalmic ointment Place 1 application into the left eye every 6 (six) hours. Place 1/2 inch ribbon of ointment in the affected eye 4 times a day Patient not taking: Reported on 11/08/2019 02/09/19   Margarita Mail, PA-C  fluconazole (DIFLUCAN) 150 MG  tablet Take one tab on 7/19 and one tab on 7/22 11/08/19   Robinson, [place] N, PA-C  glucose blood test strip Use as instructed. Check blood glucose levels by fingerstick twice per day 11/08/19   Robinson, [place] N, PA-C  ibuprofen (ADVIL) 200 MG tablet Take 400 mg by mouth every 6 (six) hours as needed for moderate pain.    [provider]  insulin glargine (LANTUS) 100 UNIT/ML injection Inject 0.3 mLs (30 Units total) into the skin 2 (two) times daily. 08/24/19   Sherwood Gambler, MD  liraglutide (VICTOZA) 18 MG/3ML SOPN Inject 1.8 mg into the skin daily.    [provider]  liraglutide (VICTOZA) 18 MG/3ML SOPN Inject  1.8 mg into the skin daily. 01/05/20 02/04/20  Carmin Muskrat, MD  lisinopril (PRINIVIL,ZESTRIL) 20 MG tablet Take 1 tablet (20 mg total) by mouth daily. 02/14/18   Gildardo Pounds, NP  meloxicam (MOBIC) 15 MG tablet Take 1 tablet (15 mg total) by mouth daily. Patient not taking: Reported on 02/09/2019 03/13/18   Landis Martins, DPM  meloxicam (MOBIC) 15 MG tablet Take 1 tablet (15 mg total) by mouth daily. Patient not taking: Reported on 02/09/2019 03/13/18   Landis Martins, DPM  metFORMIN (GLUCOPHAGE) 500 MG tablet Take 1,000 mg by mouth 2 (two) times daily with a meal.    [provider]  metFORMIN (GLUCOPHAGE) 500 MG tablet Take 2 tablets (1,000 mg total) by mouth 2 (two) times daily with a meal. 01/05/20 02/04/20  Carmin Muskrat, MD  Misc. Devices MISC Please provide patient with glucerna for nutritional support. 02/14/18   Gildardo Pounds, NP  naproxen (NAPROSYN) 375 MG tablet Take 1 tablet (375 mg total) by mouth 2 (two) times daily. Patient not taking: Reported on 02/09/2019 03/19/18   Wurst, [place], PA-C  nystatin (MYCOSTATIN/NYSTOP) powder Apply 1 application topically 3 (three) times daily. 11/08/19   Robinson, [place] N, PA-C  VENTOLIN HFA 108 (90 Base) MCG/ACT inhaler INHALE ONE TO TWO PUFFS INTO THE LUNGS EVERY 6 HOURS AS NEEDED FOR WHEEZING OR SHORTNESS OF BREATH Patient taking differently: Inhale 2 puffs into the lungs every 4 (four) hours as needed for wheezing.  04/13/18   Gildardo Pounds, NP    Allergies    Latex, Morphine and related, and Tape  Review of Systems   Review of Systems  Constitutional: Positive for appetite change, chills and fatigue. Negative for fever.  HENT: Positive for congestion and rhinorrhea. Negative for sore throat.   Respiratory: Positive for cough. Negative for shortness of breath.   Cardiovascular: Negative for chest pain.  Gastrointestinal: Positive for nausea. Negative for abdominal pain, diarrhea and vomiting.  Genitourinary:  Negative for dysuria and frequency.  Musculoskeletal: Negative for arthralgias and myalgias.  Skin: Negative for color change and rash.  Neurological: Negative for dizziness, syncope and light-headedness.    Physical Exam Updated Vital Signs BP (!) 128/95 (BP Location: Right Arm)   Pulse (!) 116   Temp 100 F (37.8 C) (Oral)   Resp 16   LMP 01/02/2020   SpO2 99%   Physical Exam Vitals and nursing note reviewed.  Constitutional:      General: She is not in acute distress.    Appearance: Normal appearance. She is well-developed. She is not ill-appearing or diaphoretic.     Comments: Well-appearing and in no distress  HENT:     Head: Normocephalic and atraumatic.     Nose: Congestion and rhinorrhea present.     Mouth/Throat:  Mouth: Mucous membranes are moist.     Pharynx: Oropharynx is clear.  Eyes:     General:        Right eye: No discharge.        Left eye: No discharge.  Neck:     Comments: No rigidity Cardiovascular:     Rate and Rhythm: Normal rate and regular rhythm.     Heart sounds: Normal heart sounds. No murmur heard.  No friction rub. No gallop.   Pulmonary:     Effort: Pulmonary effort is normal. No respiratory distress.     Breath sounds: Normal breath sounds.     Comments: Respirations equal and unlabored, patient able to speak in full sentences, lungs clear to auscultation bilaterally Abdominal:     General: Bowel sounds are normal. There is no distension.     Palpations: Abdomen is soft. There is no mass.     Tenderness: There is no abdominal tenderness. There is no guarding.     Comments: Abdomen soft, nondistended, nontender to palpation in all quadrants without guarding or peritoneal signs  Musculoskeletal:        General: No deformity.     Cervical back: Neck supple.  Lymphadenopathy:     Cervical: No cervical adenopathy.  Skin:    General: Skin is warm and dry.     Capillary Refill: Capillary refill takes less than 2 seconds.    Neurological:     Mental Status: She is alert and oriented to person, place, and time.     Coordination: Coordination normal.  Psychiatric:        Mood and Affect: Mood normal.        Behavior: Behavior normal.     ED Results / Procedures / Treatments   Labs (all labs ordered are listed, but only abnormal results are displayed) Labs Reviewed  RESPIRATORY PANEL BY RT PCR (FLU A&B, COVID) - Abnormal; Notable for the following components:      Result Value   SARS Coronavirus 2 by RT PCR POSITIVE (*)    All other components within normal limits  BASIC METABOLIC PANEL - Abnormal; Notable for the following components:   Chloride 97 (*)    Glucose, Bld 210 (*)    All other components within normal limits  CBC WITH DIFFERENTIAL/PLATELET - Abnormal; Notable for the following components:   MCV 78.1 (*)    All other components within normal limits    EKG None  Radiology DG Chest Port 1 View  Result Date: 01/19/2020 CLINICAL DATA:  COVID exposure 8 days ago. EXAM: PORTABLE CHEST 1 VIEW COMPARISON:  Radiograph 01/05/2020 FINDINGS: Lung volumes are low. Normal heart size and mediastinal contours for technique. Subsegmental opacity at the right lung base. No pneumothorax or pneumomediastinum. No significant pleural effusion. No acute osseous abnormalities are seen. IMPRESSION: Low lung volumes with subsegmental opacity at the right lung base, may represent atelectasis. COVID pneumonia is not entirely excluded. Electronically Signed   By: Keith Rake M.D.   On: 01/19/2020 21:16    Procedures Procedures (including critical care time)  Medications Ordered in ED Medications  sodium chloride 0.9 % bolus 1,000 mL (0 mLs Intravenous Stopped 01/19/20 2239)  acetaminophen (TYLENOL) tablet 1,000 mg (1,000 mg Oral Given 01/19/20 2104)    ED Course  I have reviewed the triage vital signs and the nursing notes.  Pertinent labs & imaging results that were available during my care of the  patient were reviewed by me and considered in my  medical decision making (see chart for details).    MDM Rules/Calculators/A&P                           44 year old female presents after she had a Covid exposure 8 days ago, she did get the first dose of her Pfizer vaccine after the exposure and a few days ago developed symptoms of chills, fatigue, poor appetite, nasal congestion and cough.  Denies any chest pain or shortness of breath.  Patient is very concerned that she is dehydrated due to poor intake and is worried it can affect her diabetes.  She is still been taking her medications regularly.  Patient is tachycardic with a low-grade fever but vitals otherwise normal.  O2 sats at 99% with no increased work of breathing and lungs are clear.  Patient's Covid test is positive.  Her chest x-ray shows low lung volumes with subsegmental opacity in the right lung base that could represent atelectasis or early Covid pneumonia.  Her lab work shows a glucose of 210 but otherwise no significant electrolyte derangements.  She is feeling much better after IV fluids and Tylenol and tachycardia is improving.  She is not hypoxic and symptoms are overall mild.  Discussed symptomatic treatment and strict return precautions.  Encourage patient to purchase home pulse ox.  Discharged home in good condition.  Lakia Gritton was evaluated in Emergency Department on 01/21/2020 for the symptoms described in the history of present illness. She was evaluated in the context of the global COVID-19 pandemic, which necessitated consideration that the patient might be at risk for infection with the SARS-CoV-2 virus that causes COVID-19. Institutional protocols and algorithms that pertain to the evaluation of patients at risk for COVID-19 are in a state of rapid change based on information released by regulatory bodies including the CDC and federal and state organizations. These policies and algorithms were followed during the  patient's care in the ED.   Final Clinical Impression(s) / ED Diagnoses Final diagnoses:  [id number] virus infection  Hyperglycemia    Rx / DC Orders ED Discharge Orders    None       Janet Berlin 01/21/20 1135    Wyvonnia Dusky, MD 01/22/20 [phone]

## 2020-01-19 NOTE — ED Triage Notes (Signed)
Patient reports to the ER for COVID exposure x8 days ago. Patient reports she got vaccinated x7 days ago.

## 2020-01-19 NOTE — Discharge Instructions (Signed)
You have tested positive for COVID-19 virus.  Please continue to quarantine at home and monitor your symptoms closely. You chest x-ray was clear. Antibiotics are not helpful in treating viral infection, the virus should run its course in about 10-14 days. Please make sure you are drinking plenty of fluids. You can treat your symptoms supportively with tylenol for fevers and pains, and over the counter cough medications like Mucinex and throat lozenges to help with cough.  You can use saline nasal sprays, Flonase or Zyrtec to help with nasal congestion.  If your symptoms are not improving please follow up with you Primary doctor or the post Covid care center.   I recommend that you purchase a home pulse ox to help better monitor your oxygen at home, if you start to have increased work of breathing or shortness of breath or your oxygen drops below 92% please immediately return to the hospital for reevaluation.  If you develop persistent fevers, shortness of breath or difficulty breathing, chest pain, severe headache and neck pain, persistent nausea and vomiting or other new or concerning symptoms return to the Emergency department.

## 2020-01-20 ENCOUNTER — Other Ambulatory Visit: Payer: Self-pay | Admitting: Critical Care Medicine

## 2020-01-20 ENCOUNTER — Telehealth: Payer: Self-pay | Admitting: Physician Assistant

## 2020-01-20 DIAGNOSIS — I10 Essential (primary) hypertension: Secondary | ICD-10-CM | POA: Insufficient documentation

## 2020-01-20 DIAGNOSIS — U071 COVID-19: Secondary | ICD-10-CM

## 2020-01-20 DIAGNOSIS — E1165 Type 2 diabetes mellitus with hyperglycemia: Secondary | ICD-10-CM

## 2020-01-20 NOTE — Progress Notes (Signed)
I connected by phone with Orma Render on 01/20/2020 at 6:33 PM to discuss the potential use of a new treatment for mild to moderate COVID-19 viral infection in non-hospitalized patients.  This patient is a 44 y.o. female that meets the FDA criteria for Emergency Use Authorization of COVID monoclonal antibody casirivimab/imdevimab or bamlanivimab/eteseviamb.  Has a (+) direct SARS-CoV-2 viral test result  Has mild or moderate COVID-19   Is NOT hospitalized due to COVID-19  Is within 10 days of symptom onset  Has at least one of the high risk factor(s) for progression to severe COVID-19 and/or hospitalization as defined in EUA.  Specific high risk criteria : BMI > 25 and Diabetes HTN   I have spoken and communicated the following to the patient or parent/caregiver regarding COVID monoclonal antibody treatment:  1. FDA has authorized the emergency use for the treatment of mild to moderate COVID-19 in adults and pediatric patients with positive results of direct SARS-CoV-2 viral testing who are 48 years of age and older weighing at least 40 kg, and who are at high risk for progressing to severe COVID-19 and/or hospitalization.  2. The significant known and potential risks and benefits of COVID monoclonal antibody, and the extent to which such potential risks and benefits are unknown.  3. Information on available alternative treatments and the risks and benefits of those alternatives, including clinical trials.  4. Patients treated with COVID monoclonal antibody should continue to self-isolate and use infection control measures (e.g., wear mask, isolate, social distance, avoid sharing personal items, clean and disinfect "high touch" surfaces, and frequent handwashing) according to CDC guidelines.   5. The patient or parent/caregiver has the option to accept or refuse COVID monoclonal antibody treatment.  After reviewing this information with the patient, the patient has agreed to receive  one of the available covid 19 monoclonal antibodies and will be provided an appropriate fact sheet prior to infusion. Shan Levans, MD 01/20/2020 6:33 PM

## 2020-01-20 NOTE — Telephone Encounter (Signed)
  Called to discuss with patient about Covid symptoms and the use of casirivimab/imdevimab, a monoclonal antibody infusion for those with mild to moderate Covid symptoms and at a high risk of hospitalization.  Pt is qualified for this infusion at the [facility] center due hx of DM and obesity.    Patient would like to call back later today.   Manson Passey, PA - C

## 2020-01-21 ENCOUNTER — Ambulatory Visit (HOSPITAL_COMMUNITY)
Admission: RE | Admit: 2020-01-21 | Discharge: 2020-01-21 | Disposition: A | Discharge status: Home / Self Care | Payer: Medicare Other | Source: Ambulatory Visit | Attending: Pulmonary Disease | Admitting: Pulmonary Disease

## 2020-01-21 DIAGNOSIS — I10 Essential (primary) hypertension: Secondary | ICD-10-CM | POA: Diagnosis present

## 2020-01-21 DIAGNOSIS — E1165 Type 2 diabetes mellitus with hyperglycemia: Secondary | ICD-10-CM | POA: Diagnosis present

## 2020-01-21 DIAGNOSIS — U071 COVID-19: Secondary | ICD-10-CM | POA: Insufficient documentation

## 2020-01-21 DIAGNOSIS — Z794 Long term (current) use of insulin: Secondary | ICD-10-CM | POA: Diagnosis present

## 2020-01-21 DIAGNOSIS — Z23 Encounter for immunization: Secondary | ICD-10-CM | POA: Diagnosis not present

## 2020-01-21 MED ORDER — EPINEPHRINE 0.3 MG/0.3ML IJ SOAJ: 0.3000 mg | Freq: Once | INTRAMUSCULAR | Status: DC | PRN

## 2020-01-21 MED ORDER — METHYLPREDNISOLONE SODIUM SUCC 125 MG IJ SOLR: 125.0000 mg | Freq: Once | INTRAMUSCULAR | Status: DC | PRN

## 2020-01-21 MED ORDER — ALBUTEROL SULFATE HFA 108 (90 BASE) MCG/ACT IN AERS: 2.0000 | INHALATION_SPRAY | Freq: Once | RESPIRATORY_TRACT | Status: DC | PRN

## 2020-01-21 MED ORDER — SODIUM CHLORIDE 0.9 % IV SOLN: INTRAVENOUS | Status: DC | PRN

## 2020-01-21 MED ORDER — SODIUM CHLORIDE 0.9 % IV SOLN
1200.0000 mg | Freq: Once | INTRAVENOUS | Status: AC
  Administered 2020-01-21: 1200 mg via INTRAVENOUS

## 2020-01-21 MED ORDER — ACETAMINOPHEN 325 MG PO TABS
650.0000 mg | ORAL_TABLET | Freq: Once | ORAL | Status: AC
  Administered 2020-01-21: 650 mg via ORAL
  Filled 2020-01-21: qty 2

## 2020-01-21 MED ORDER — FAMOTIDINE IN NACL 20-0.9 MG/50ML-% IV SOLN: 20.0000 mg | Freq: Once | INTRAVENOUS | Status: DC | PRN

## 2020-01-21 MED ORDER — DIPHENHYDRAMINE HCL 50 MG/ML IJ SOLN: 50.0000 mg | Freq: Once | INTRAMUSCULAR | Status: DC | PRN

## 2020-01-21 NOTE — Progress Notes (Signed)
  Diagnosis: COVID-19  Physician: Dr. Wright  Procedure: Covid Infusion Clinic Med: casirivimab\imdevimab infusion - Provided patient with casirivimab\imdevimab fact sheet for patients, parents and caregivers prior to infusion.  Complications: No immediate complications noted.  Discharge: Discharged home   Evelyn Watts M Evelyn Watts 01/21/2020  

## 2020-01-21 NOTE — Discharge Instructions (Signed)

## 2020-01-23 ENCOUNTER — Ambulatory Visit: Payer: Medicare HMO | Admitting: Sports Medicine

## 2020-01-28 ENCOUNTER — Telehealth: Payer: Self-pay | Admitting: General Practice

## 2020-01-28 NOTE — Telephone Encounter (Signed)
Pt was called per referral from [facility]. Declined to make appt w/ [facility]

## 2020-01-29 ENCOUNTER — Other Ambulatory Visit: Payer: Medicare HMO

## 2020-02-12 ENCOUNTER — Ambulatory Visit (HOSPITAL_COMMUNITY)
Admission: EM | Admit: 2020-02-12 | Discharge: 2020-02-12 | Disposition: A | Discharge status: Home / Self Care | Payer: Medicare HMO | Attending: Family Medicine | Admitting: Family Medicine

## 2020-02-12 ENCOUNTER — Other Ambulatory Visit: Payer: Self-pay

## 2020-02-12 ENCOUNTER — Encounter (HOSPITAL_COMMUNITY): Payer: Self-pay | Admitting: *Deleted

## 2020-02-12 DIAGNOSIS — R059 Cough, unspecified: Secondary | ICD-10-CM | POA: Diagnosis not present

## 2020-02-12 HISTORY — DX: Allergy, unspecified, initial encounter: T78.40XA

## 2020-02-12 MED ORDER — CETIRIZINE HCL 10 MG PO TABS: 10.0000 mg | ORAL_TABLET | Freq: Every day | ORAL | 0 refills | Status: DC

## 2020-02-12 MED ORDER — ALBUTEROL SULFATE HFA 108 (90 BASE) MCG/ACT IN AERS: 2.0000 | INHALATION_SPRAY | RESPIRATORY_TRACT | 0 refills | Status: DC | PRN

## 2020-02-12 NOTE — ED Triage Notes (Addendum)
Patient in requesting retest for COVID-19. Patient in with complaints of cough. Patient tested positive for COVID-19 on 01/19/20.Patient has a history of allergies and asthma.

## 2020-02-12 NOTE — Discharge Instructions (Addendum)
Medication as prescribed Follow up as needed for continued or worsening symptoms  

## 2020-02-13 NOTE — ED Provider Notes (Signed)
[place]    CSN: [id number] Arrival date & time: 02/12/20  0805      History   Chief Complaint Chief Complaint  Patient presents with  . Cough    HPI Evelyn Watts is a 44 y.o. female.   Pt is a 44 year old female that presents with lingering cough that is mild. Recently dx with covid a few weeks ago. Requesting to be re swabbed to return to work .Otherwise she is feeling well.      Past Medical History:  Diagnosis Date  . Allergies   . Asthma   . Diabetes mellitus without complication [facility])     Patient Active Problem List   Diagnosis Date Noted  . Essential hypertension 01/20/2020  . COVID-19 virus infection 01/20/2020  . Type 2 diabetes mellitus with hyperglycemia, with long-term current use of insulin ([place]) 02/14/2018  . Diabetes mellitus without complication ([place]) 42/70/6237  . Moderate episode of recurrent major depressive disorder ([place]) 05/24/2016  . Morbid obesity ([place]) 05/24/2016    Past Surgical History:  Procedure Laterality Date  . CESAREAN SECTION     4  . CHOLECYSTECTOMY    . HERNIA REPAIR    . TONSILLECTOMY      OB History   No obstetric history on file.      Home Medications    Prior to Admission medications   Medication Sig Start Date End Date Taking? Authorizing Provider  albuterol (VENTOLIN HFA) 108 (90 Base) MCG/ACT inhaler Inhale 2 puffs into the lungs every 4 (four) hours as needed for wheezing. 02/12/20   Loura Halt A, NP  blood glucose meter kit and supplies KIT Dispense based on patient and insurance preference. Use up to four times daily as directed. (FOR ICD-9 250.00, 250.01). 10/10/18   Couture, Cortni S, PA-C  cetirizine (ZYRTEC) 10 MG tablet Take 1 tablet (10 mg total) by mouth daily. 02/12/20   Loura Halt A, NP  fluconazole (DIFLUCAN) 150 MG tablet Take one tab on 7/19 and one tab on 7/22 11/08/19   Robinson, [place] N, PA-C  glucose blood test strip Use as instructed. Check blood glucose levels by  fingerstick twice per day 11/08/19   Robinson, [place] N, PA-C  ibuprofen (ADVIL) 200 MG tablet Take 400 mg by mouth every 6 (six) hours as needed for moderate pain.    [provider]  insulin glargine (LANTUS) 100 UNIT/ML injection Inject 0.3 mLs (30 Units total) into the skin 2 (two) times daily. 08/24/19   Sherwood Gambler, MD  liraglutide (VICTOZA) 18 MG/3ML SOPN Inject 1.8 mg into the skin daily.    [provider]  liraglutide (VICTOZA) 18 MG/3ML SOPN Inject 1.8 mg into the skin daily. 01/05/20 02/04/20  Carmin Muskrat, MD  lisinopril (PRINIVIL,ZESTRIL) 20 MG tablet Take 1 tablet (20 mg total) by mouth daily. 02/14/18   Gildardo Pounds, NP  metFORMIN (GLUCOPHAGE) 500 MG tablet Take 1,000 mg by mouth 2 (two) times daily with a meal.    [provider]  metFORMIN (GLUCOPHAGE) 500 MG tablet Take 2 tablets (1,000 mg total) by mouth 2 (two) times daily with a meal. 01/05/20 02/04/20  Carmin Muskrat, MD  Misc. Devices MISC Please provide patient with glucerna for nutritional support. 02/14/18   Gildardo Pounds, NP  nystatin (MYCOSTATIN/NYSTOP) powder Apply 1 application topically 3 (three) times daily. 11/08/19   Robinson, [place] N, PA-C  atorvastatin (LIPITOR) 20 MG tablet Take 1 tablet (20 mg total) by mouth daily. Patient not taking:  Reported on 02/09/2019 02/14/18 02/12/20  Gildardo Pounds, NP    Family History Family History  Problem Relation Age of Onset  . Healthy Mother   . Healthy Father   . Hypertension Neg Hx   . Diabetes Neg Hx     Social History Social History   Tobacco Use  . Smoking status: Never Smoker  . Smokeless tobacco: Never Used  Vaping Use  . Vaping Use: Never used  Substance Use Topics  . Alcohol use: No  . Drug use: No     Allergies   Latex, Morphine and related, and Tape   Review of Systems Review of Systems   Physical Exam Triage Vital Signs ED Triage Vitals  Enc Vitals Group     BP 02/12/20 0831 (!) 136/102      Pulse Rate 02/12/20 0831 (!) 102     Resp 02/12/20 0831 20     Temp 02/12/20 0831 97.8 F (36.6 C)     Temp Source 02/12/20 0831 Oral     SpO2 02/12/20 0831 100 %     Weight --      Height --      Head Circumference --      Peak Flow --      Pain Score 02/12/20 0833 0     Pain Loc --      Pain Edu? --      Excl. in [place]? --    No data found.  Updated Vital Signs BP (!) 136/102 Comment (BP Location): right forearm  Pulse (!) 102   Temp 97.8 F (36.6 C) (Oral)   Resp 20   SpO2 100%   Visual Acuity Right Eye Distance:   Left Eye Distance:   Bilateral Distance:    Right Eye Near:   Left Eye Near:    Bilateral Near:     Physical Exam Vitals and nursing note reviewed.  Constitutional:      General: She is not in acute distress.    Appearance: Normal appearance. She is not ill-appearing, toxic-appearing or diaphoretic.  HENT:     Head: Normocephalic.     Nose: Nose normal.  Eyes:     Conjunctiva/sclera: Conjunctivae normal.  Pulmonary:     Effort: Pulmonary effort is normal.  Musculoskeletal:        General: Normal range of motion.     Cervical back: Normal range of motion.  Skin:    General: Skin is warm and dry.     Findings: No rash.  Neurological:     Mental Status: She is alert.  Psychiatric:        Mood and Affect: Mood normal.      UC Treatments / Results  Labs (all labs ordered are listed, but only abnormal results are displayed) Labs Reviewed - No data to display  EKG   Radiology No results found.  Procedures Procedures (including critical care time)  Medications Ordered in UC Medications - No data to display  Initial Impression / Assessment and Plan / UC Course  I have reviewed the triage vital signs and the nursing notes.  Pertinent labs & imaging results that were available during my care of the patient were reviewed by me and considered in my medical decision making (see chart for details).     Cough Most likely post viral  Re  swabbed for covid  Follow up as needed for continued or worsening symptoms Refilled zyrtec and albuterol as requested.  Final Clinical Impressions(s) / UC  Diagnoses   Final diagnoses:  Cough     Discharge Instructions     Medication as prescribed Follow up as needed for continued or worsening symptoms     ED Prescriptions    Medication Sig Dispense Auth. Provider   cetirizine (ZYRTEC) 10 MG tablet Take 1 tablet (10 mg total) by mouth daily. 30 tablet Duffy Dantonio A, NP   albuterol (VENTOLIN HFA) 108 (90 Base) MCG/ACT inhaler Inhale 2 puffs into the lungs every 4 (four) hours as needed for wheezing. 1 each Orvan July, NP     PDMP not reviewed this encounter.   Orvan July, NP 02/13/20 [phone]

## 2020-02-20 ENCOUNTER — Other Ambulatory Visit: Payer: Self-pay | Admitting: Sports Medicine

## 2020-02-20 ENCOUNTER — Ambulatory Visit (INDEPENDENT_AMBULATORY_CARE_PROVIDER_SITE_OTHER): Payer: Medicare HMO

## 2020-02-20 ENCOUNTER — Other Ambulatory Visit: Payer: Self-pay

## 2020-02-20 ENCOUNTER — Ambulatory Visit (INDEPENDENT_AMBULATORY_CARE_PROVIDER_SITE_OTHER): Payer: Medicare HMO | Admitting: Sports Medicine

## 2020-02-20 ENCOUNTER — Encounter: Payer: Self-pay | Admitting: Sports Medicine

## 2020-02-20 DIAGNOSIS — M2040 Other hammer toe(s) (acquired), unspecified foot: Secondary | ICD-10-CM

## 2020-02-20 DIAGNOSIS — M7751 Other enthesopathy of right foot: Secondary | ICD-10-CM

## 2020-02-20 DIAGNOSIS — M779 Enthesopathy, unspecified: Secondary | ICD-10-CM

## 2020-02-20 DIAGNOSIS — M79672 Pain in left foot: Secondary | ICD-10-CM

## 2020-02-20 DIAGNOSIS — M19072 Primary osteoarthritis, left ankle and foot: Secondary | ICD-10-CM

## 2020-02-20 DIAGNOSIS — M79671 Pain in right foot: Secondary | ICD-10-CM | POA: Diagnosis not present

## 2020-02-20 MED ORDER — DICLOFENAC SODIUM 1 % EX GEL: 4.0000 g | Freq: Four times a day (QID) | CUTANEOUS | 1 refills | Status: DC

## 2020-02-20 NOTE — Progress Notes (Signed)
Subjective: Evelyn Watts is a 44 y.o. female patient who presents to office for evaluation of left greater than right foot pain. Patient complains of progressive pain especially over the last several weeks went to urgent care and was told that she has Bone spurs.  Patient reports that she gets sharp shooting pain even with sitting worse with walking or activity.  Fasting blood sugar not recorded.  A1c unknown.  Patient denies any other pedal complaints.   Patient Active Problem List   Diagnosis Date Noted  . Essential hypertension 01/20/2020  . COVID-19 virus infection 01/20/2020  . Type 2 diabetes mellitus with hyperglycemia, with long-term current use of insulin ([place]) 02/14/2018  . Diabetes mellitus without complication ([place]) 84/16/6063  . Moderate episode of recurrent major depressive disorder ([place]) 05/24/2016  . Morbid obesity ([place]) 05/24/2016    Current Outpatient Medications on File Prior to Visit  Medication Sig Dispense Refill  . albuterol (VENTOLIN HFA) 108 (90 Base) MCG/ACT inhaler Inhale 2 puffs into the lungs every 4 (four) hours as needed for wheezing. 1 each 0  . blood glucose meter kit and supplies KIT Dispense based on patient and insurance preference. Use up to four times daily as directed. (FOR ICD-9 250.00, 250.01). 1 each 0  . cetirizine (ZYRTEC) 10 MG tablet Take 1 tablet (10 mg total) by mouth daily. 30 tablet 0  . fluconazole (DIFLUCAN) 150 MG tablet Take one tab on 7/19 and one tab on 7/22 2 tablet 0  . glucose blood test strip Use as instructed. Check blood glucose levels by fingerstick twice per day 100 each 1  . ibuprofen (ADVIL) 200 MG tablet Take 400 mg by mouth every 6 (six) hours as needed for moderate pain.    Marland Kitchen insulin glargine (LANTUS) 100 UNIT/ML injection Inject 0.3 mLs (30 Units total) into the skin 2 (two) times daily. 10 mL 3  . liraglutide (VICTOZA) 18 MG/3ML SOPN Inject 1.8 mg into the skin daily.    Marland Kitchen lisinopril (PRINIVIL,ZESTRIL) 20 MG tablet Take  1 tablet (20 mg total) by mouth daily. 90 tablet 0  . metFORMIN (GLUCOPHAGE) 500 MG tablet Take 1,000 mg by mouth 2 (two) times daily with a meal.    . Misc. Devices MISC Please provide patient with glucerna for nutritional support. 48 each 3  . nystatin (MYCOSTATIN/NYSTOP) powder Apply 1 application topically 3 (three) times daily. 15 g 0  . liraglutide (VICTOZA) 18 MG/3ML SOPN Inject 1.8 mg into the skin daily. 3 mL 3  . metFORMIN (GLUCOPHAGE) 500 MG tablet Take 2 tablets (1,000 mg total) by mouth 2 (two) times daily with a meal. 60 tablet 1  . [DISCONTINUED] atorvastatin (LIPITOR) 20 MG tablet Take 1 tablet (20 mg total) by mouth daily. (Patient not taking: Reported on 02/09/2019) 90 tablet 3   No current facility-administered medications on file prior to visit.    Allergies  Allergen Reactions  . Latex Rash  . Morphine And Related Rash  . Tape Rash    Objective:  General: Alert and oriented x3 in no acute distress  Dermatology: No open lesions bilateral lower extremities, no webspace macerations, no ecchymosis bilateral, all nails x 10 are well manicured.  Vascular: Dorsalis Pedis and Posterior Tibial pedal pulses palpable, Capillary Fill Time 3 seconds,(+) pedal hair growth bilateral, no edema bilateral lower extremities, Temperature gradient within normal limits.  Neurology: Evelyn Watts sensation intact via light touch bilateral, Protective sensation hypersensitive bilateral.  Musculoskeletal: Mild tenderness with palpation at dorsal midfoot bilateral left greater  than right and to fourth and fifth toes left greater than right with hammertoe deformity noted Gait: Mildly Antalgic gait  Xrays  Right and left foot   Impression: Midtarsal breech supportive of pes planus deformity with dorsal bone spur noted. Hammertoe deformity 4-5 L>R, cal spur.  Assessment and Plan: Problem List Items Addressed This Visit    None    Visit Diagnoses    Bone spur    -  Primary   Hammer toe,  unspecified laterality       Foot pain, bilateral           -Complete examination performed -Xrays reviewed -Discussed treatement options for painful bone spurs and hammertoes -Rx topical Voltaren for patient to use as instructed -Dispensed toe cap for patient to use when she is at bilateral toes -Encouraged good supportive shoes daily for foot type -Advised patient if symptoms fail to improve may consider surgery; A1c must be less than 7.5 -Patient to return to office 1 month for surgery consult or sooner if condition worsens.  Landis Martins, DPM

## 2020-02-28 ENCOUNTER — Emergency Department (HOSPITAL_COMMUNITY)
Admission: EM | Admit: 2020-02-28 | Discharge: 2020-02-28 | Disposition: A | Discharge status: Home / Self Care | Payer: Medicare HMO | Attending: Emergency Medicine | Admitting: Emergency Medicine

## 2020-02-28 ENCOUNTER — Emergency Department (HOSPITAL_COMMUNITY): Payer: Medicare HMO

## 2020-02-28 ENCOUNTER — Encounter (HOSPITAL_COMMUNITY): Payer: Self-pay | Admitting: Emergency Medicine

## 2020-02-28 ENCOUNTER — Other Ambulatory Visit: Payer: Self-pay

## 2020-02-28 DIAGNOSIS — S9032XA Contusion of left foot, initial encounter: Secondary | ICD-10-CM | POA: Insufficient documentation

## 2020-02-28 DIAGNOSIS — M79672 Pain in left foot: Secondary | ICD-10-CM

## 2020-02-28 DIAGNOSIS — Z79899 Other long term (current) drug therapy: Secondary | ICD-10-CM | POA: Insufficient documentation

## 2020-02-28 DIAGNOSIS — T148XXA Other injury of unspecified body region, initial encounter: Secondary | ICD-10-CM

## 2020-02-28 DIAGNOSIS — Z7984 Long term (current) use of oral hypoglycemic drugs: Secondary | ICD-10-CM | POA: Insufficient documentation

## 2020-02-28 DIAGNOSIS — E1165 Type 2 diabetes mellitus with hyperglycemia: Secondary | ICD-10-CM | POA: Insufficient documentation

## 2020-02-28 DIAGNOSIS — J45909 Unspecified asthma, uncomplicated: Secondary | ICD-10-CM | POA: Insufficient documentation

## 2020-02-28 DIAGNOSIS — W208XXA Other cause of strike by thrown, projected or falling object, initial encounter: Secondary | ICD-10-CM | POA: Insufficient documentation

## 2020-02-28 DIAGNOSIS — S99922A Unspecified injury of left foot, initial encounter: Secondary | ICD-10-CM | POA: Diagnosis present

## 2020-02-28 DIAGNOSIS — Z794 Long term (current) use of insulin: Secondary | ICD-10-CM | POA: Insufficient documentation

## 2020-02-28 DIAGNOSIS — I10 Essential (primary) hypertension: Secondary | ICD-10-CM | POA: Diagnosis not present

## 2020-02-28 DIAGNOSIS — Z91040 Latex allergy status: Secondary | ICD-10-CM | POA: Insufficient documentation

## 2020-02-28 DIAGNOSIS — Z8616 Personal history of COVID-19: Secondary | ICD-10-CM | POA: Diagnosis not present

## 2020-02-28 MED ORDER — LIDOCAINE 5 % EX PTCH: 1.0000 | MEDICATED_PATCH | CUTANEOUS | 0 refills | Status: DC

## 2020-02-28 NOTE — ED Notes (Signed)
Patient left before discharge process could be completed. She said she had to rush to catch her ride home.

## 2020-02-28 NOTE — ED Triage Notes (Signed)
Per EMS, patient from home, c/o left foot pain after wooden bed frame fell on foot tonight.

## 2020-02-28 NOTE — Progress Notes (Signed)
Orthopedic Tech Progress Note Patient Details:  Evelyn Watts [birth year] [id number]  Ortho Devices Type of Ortho Device: Ace wrap Ortho Device/Splint Location: applied post op shoe to LLE Ortho Device/Splint Interventions: Ordered, Application   Post Interventions Patient Tolerated: Well Instructions Provided: Care of device   Jennye Moccasin 02/28/2020, 10:44 PM

## 2020-02-29 NOTE — ED Provider Notes (Signed)
[facility] DEPT Provider Note   CSN: [id number] Arrival date & time: 02/28/20  2049     History No chief complaint on file.   Evelyn Watts is a 44 y.o. female.  HPI     44yo female with history below presents with concern for left foot pain. Reports footboard fell onto her foot approx one hour PTA>  Pain currently 4/10.   Worse with movemnts, palpation.  No other concners for injury. No fever, chest pain or dyspnea.  Past Medical History:  Diagnosis Date  . Allergies   . Asthma   . Diabetes mellitus without complication [facility])     Patient Active Problem List   Diagnosis Date Noted  . Essential hypertension 01/20/2020  . COVID-19 virus infection 01/20/2020  . Type 2 diabetes mellitus with hyperglycemia, with long-term current use of insulin ([place]) 02/14/2018  . Diabetes mellitus without complication ([place]) 68/03/7516  . Moderate episode of recurrent major depressive disorder ([place]) 05/24/2016  . Morbid obesity ([place]) 05/24/2016    Past Surgical History:  Procedure Laterality Date  . CESAREAN SECTION     4  . CHOLECYSTECTOMY    . HERNIA REPAIR    . TONSILLECTOMY       OB History   No obstetric history on file.     Family History  Problem Relation Age of Onset  . Healthy Mother   . Healthy Father   . Hypertension Neg Hx   . Diabetes Neg Hx     Social History   Tobacco Use  . Smoking status: Never Smoker  . Smokeless tobacco: Never Used  Vaping Use  . Vaping Use: Never used  Substance Use Topics  . Alcohol use: No  . Drug use: No    Home Medications Prior to Admission medications   Medication Sig Start Date End Date Taking? Authorizing Provider  albuterol (VENTOLIN HFA) 108 (90 Base) MCG/ACT inhaler Inhale 2 puffs into the lungs every 4 (four) hours as needed for wheezing. 02/12/20   Loura Halt A, NP  blood glucose meter kit and supplies KIT Dispense based on patient and insurance preference. Use up to four times  daily as directed. (FOR ICD-9 250.00, 250.01). 10/10/18   Couture, Cortni S, PA-C  cetirizine (ZYRTEC) 10 MG tablet Take 1 tablet (10 mg total) by mouth daily. 02/12/20   Loura Halt A, NP  diclofenac Sodium (VOLTAREN) 1 % GEL Apply 4 g topically 4 (four) times daily. 02/20/20   Landis Martins, DPM  fluconazole (DIFLUCAN) 150 MG tablet Take one tab on 7/19 and one tab on 7/22 11/08/19   Robinson, [place] N, PA-C  glucose blood test strip Use as instructed. Check blood glucose levels by fingerstick twice per day 11/08/19   Robinson, [place] N, PA-C  ibuprofen (ADVIL) 200 MG tablet Take 400 mg by mouth every 6 (six) hours as needed for moderate pain.    [provider]  insulin glargine (LANTUS) 100 UNIT/ML injection Inject 0.3 mLs (30 Units total) into the skin 2 (two) times daily. 08/24/19   Sherwood Gambler, MD  lidocaine (LIDODERM) 5 % Place 1 patch onto the skin daily. Remove & Discard patch within 12 hours or as directed by MD 02/28/20   Gareth Morgan, MD  liraglutide (VICTOZA) 18 MG/3ML SOPN Inject 1.8 mg into the skin daily.    [provider]  liraglutide (VICTOZA) 18 MG/3ML SOPN Inject 1.8 mg into the skin daily. 01/05/20 02/04/20  Carmin Muskrat, MD  lisinopril (PRINIVIL,ZESTRIL) 20 MG  tablet Take 1 tablet (20 mg total) by mouth daily. 02/14/18   Gildardo Pounds, NP  metFORMIN (GLUCOPHAGE) 500 MG tablet Take 1,000 mg by mouth 2 (two) times daily with a meal.    [provider]  metFORMIN (GLUCOPHAGE) 500 MG tablet Take 2 tablets (1,000 mg total) by mouth 2 (two) times daily with a meal. 01/05/20 02/04/20  Carmin Muskrat, MD  Misc. Devices MISC Please provide patient with glucerna for nutritional support. 02/14/18   Gildardo Pounds, NP  nystatin (MYCOSTATIN/NYSTOP) powder Apply 1 application topically 3 (three) times daily. 11/08/19   Robinson, [place] N, PA-C  atorvastatin (LIPITOR) 20 MG tablet Take 1 tablet (20 mg total) by mouth daily. Patient not taking: Reported  on 02/09/2019 02/14/18 02/12/20  Gildardo Pounds, NP    Allergies    Latex, Morphine and related, and Tape  Review of Systems   Review of Systems  Constitutional: Negative for fever.  Respiratory: Negative for cough and shortness of breath.   Cardiovascular: Negative for chest pain.  Gastrointestinal: Negative for vomiting.  Musculoskeletal: Positive for arthralgias.    Physical Exam Updated Vital Signs BP (!) 129/101 (BP Location: Left Arm)   Pulse 98   Temp 98.6 F (37 C) (Oral)   Resp 16   LMP 02/28/2020 (Exact Date)   SpO2 99%   Physical Exam Vitals and nursing note reviewed.  Constitutional:      General: She is not in acute distress.    Appearance: Normal appearance. She is not ill-appearing, toxic-appearing or diaphoretic.  HENT:     Head: Normocephalic.  Eyes:     Conjunctiva/sclera: Conjunctivae normal.  Cardiovascular:     Rate and Rhythm: Normal rate and regular rhythm.     Pulses: Normal pulses.  Pulmonary:     Effort: Pulmonary effort is normal. No respiratory distress.  Musculoskeletal:        General: Swelling (dorsum left foot) and tenderness present. No deformity or signs of injury.     Cervical back: No rigidity.  Skin:    General: Skin is warm and dry.     Coloration: Skin is not jaundiced or pale.  Neurological:     General: No focal deficit present.     Mental Status: She is alert and oriented to person, place, and time.     ED Results / Procedures / Treatments   Labs (all labs ordered are listed, but only abnormal results are displayed) Labs Reviewed - No data to display  EKG None  Radiology DG Foot Complete Left  Result Date: 02/28/2020 CLINICAL DATA:  Foot board of bed fell on patient's foot. Pain from first distal metatarsal to the lateral malleolus known fourth and fifth hammertoe deformities. EXAM: LEFT FOOT - COMPLETE 3+ VIEW COMPARISON:  Radiographs 03/13/2018, 01/07/2018 FINDINGS: Mild soft tissue swelling seen along the  dorsal aspect of the foot. No clear acute fracture line or traumatic osseous injury is identified. No acute traumatic malalignment. Redemonstrated hammertoe deformities of the fourth and fifth digit compatible with provided history. A remote corticated fragment is seen adjacent the tip of the medial malleolus, unchanged from comparison. Apparent pes planus deformity, incompletely assessed on a non weight-bearing exam. Bidirectional calcaneal spurs. IMPRESSION: 1. Mild soft tissue swelling across the dorsal aspect of the foot. No clear acute fracture line or traumatic osseous injury is identified. 2. Hammertoe deformities of the fourth and fifth digit compatible with provided history. 3. Pes planus deformity, incompletely assessed on a non weight-bearing exam. Bidirectional  calcaneal spurs. Electronically Signed   By: Lovena Le M.D.   On: 02/28/2020 21:24    Procedures Procedures (including critical care time)  Medications Ordered in ED Medications - No data to display  ED Course  I have reviewed the triage vital signs and the nursing notes.  Pertinent labs & imaging results that were available during my care of the patient were reviewed by me and considered in my medical decision making (see chart for details).    MDM Rules/Calculators/A&P                          44yo female with history above presents with concern for left foot pain after footboard fell on it this evening.  XR without acute fracture. Has hx of hammertoe deformities,  Calcaneal spurs, pes planus deformity- which pt believes is chronic.  Given post-op shoe and recommend follow up with her foot doctor. Offered crutches but she declines. Rec weightbearing as tolerated, lidocaine, ibuprofen/tylenol. Patient discharged in stable condition with understanding of reasons to return.  Final Clinical Impression(s) / ED Diagnoses Final diagnoses:  Left foot pain  Bruising    Rx / DC Orders ED Discharge Orders         Ordered     lidocaine (LIDODERM) 5 %  Every 24 hours        02/28/20 2240           Gareth Morgan, MD 03/01/20 0002

## 2020-03-26 ENCOUNTER — Encounter: Payer: Self-pay | Admitting: Sports Medicine

## 2020-03-26 ENCOUNTER — Other Ambulatory Visit: Payer: Self-pay

## 2020-03-26 ENCOUNTER — Ambulatory Visit (INDEPENDENT_AMBULATORY_CARE_PROVIDER_SITE_OTHER): Payer: Medicare HMO | Admitting: Sports Medicine

## 2020-03-26 DIAGNOSIS — M79672 Pain in left foot: Secondary | ICD-10-CM | POA: Diagnosis not present

## 2020-03-26 DIAGNOSIS — M2040 Other hammer toe(s) (acquired), unspecified foot: Secondary | ICD-10-CM

## 2020-03-26 DIAGNOSIS — Z794 Long term (current) use of insulin: Secondary | ICD-10-CM

## 2020-03-26 DIAGNOSIS — M779 Enthesopathy, unspecified: Secondary | ICD-10-CM | POA: Diagnosis not present

## 2020-03-26 DIAGNOSIS — E1165 Type 2 diabetes mellitus with hyperglycemia: Secondary | ICD-10-CM

## 2020-03-26 NOTE — Progress Notes (Signed)
Subjective: Evelyn Watts is a 43 y.o. female patient who returns  to office for evaluation of left greater than right foot pain. Patient complains of continued pain to the top of the foot that has worsened after her dog dropped something on her foot And to her hammertoes patient reports that she wants to discuss surgery.  Patient Active Problem List   Diagnosis Date Noted  . Essential hypertension 01/20/2020  . COVID-19 virus infection 01/20/2020  . Type 2 diabetes mellitus with hyperglycemia, with long-term current use of insulin ([place]) 02/14/2018  . Diabetes mellitus without complication ([place]) 16/38/4665  . Moderate episode of recurrent major depressive disorder ([place]) 05/24/2016  . Morbid obesity ([place]) 05/24/2016    Current Outpatient Medications on File Prior to Visit  Medication Sig Dispense Refill  . albuterol (VENTOLIN HFA) 108 (90 Base) MCG/ACT inhaler Inhale 2 puffs into the lungs every 4 (four) hours as needed for wheezing. 1 each 0  . blood glucose meter kit and supplies KIT Dispense based on patient and insurance preference. Use up to four times daily as directed. (FOR ICD-9 250.00, 250.01). 1 each 0  . cetirizine (ZYRTEC) 10 MG tablet Take 1 tablet (10 mg total) by mouth daily. 30 tablet 0  . diclofenac Sodium (VOLTAREN) 1 % GEL Apply 4 g topically 4 (four) times daily. 150 g 1  . fluconazole (DIFLUCAN) 150 MG tablet Take one tab on 7/19 and one tab on 7/22 2 tablet 0  . glucose blood test strip Use as instructed. Check blood glucose levels by fingerstick twice per day 100 each 1  . ibuprofen (ADVIL) 200 MG tablet Take 400 mg by mouth every 6 (six) hours as needed for moderate pain.    Marland Kitchen insulin glargine (LANTUS) 100 UNIT/ML injection Inject 0.3 mLs (30 Units total) into the skin 2 (two) times daily. 10 mL 3  . lidocaine (LIDODERM) 5 % Place 1 patch onto the skin daily. Remove & Discard patch within 12 hours or as directed by MD 30 patch 0  . liraglutide (VICTOZA) 18 MG/3ML SOPN  Inject 1.8 mg into the skin daily.    Marland Kitchen liraglutide (VICTOZA) 18 MG/3ML SOPN Inject 1.8 mg into the skin daily. 3 mL 3  . lisinopril (PRINIVIL,ZESTRIL) 20 MG tablet Take 1 tablet (20 mg total) by mouth daily. 90 tablet 0  . metFORMIN (GLUCOPHAGE) 500 MG tablet Take 1,000 mg by mouth 2 (two) times daily with a meal.    . metFORMIN (GLUCOPHAGE) 500 MG tablet Take 2 tablets (1,000 mg total) by mouth 2 (two) times daily with a meal. 60 tablet 1  . Misc. Devices MISC Please provide patient with glucerna for nutritional support. 48 each 3  . nystatin (MYCOSTATIN/NYSTOP) powder Apply 1 application topically 3 (three) times daily. 15 g 0  . [DISCONTINUED] atorvastatin (LIPITOR) 20 MG tablet Take 1 tablet (20 mg total) by mouth daily. (Patient not taking: Reported on 02/09/2019) 90 tablet 3   No current facility-administered medications on file prior to visit.    Allergies  Allergen Reactions  . Latex Rash  . Morphine And Related Rash  . Tape Rash    Objective:  General: Alert and oriented x3 in no acute distress  Dermatology: No open lesions bilateral lower extremities, no webspace macerations, no ecchymosis bilateral, all nails x 10 are well manicured.  Abrasion as below.  Vascular: Dorsalis Pedis and Posterior Tibial pedal pulses palpable, Capillary Fill Time 3 seconds,(+) pedal hair growth bilateral, no edema bilateral lower extremities, Temperature  gradient within normal limits.  Neurology: Johney Maine sensation intact via light touch bilateral, Protective sensation hypersensitive bilateral.  Musculoskeletal: Mild tenderness with palpation at dorsal midfoot bilateral left greater than right and to fourth and fifth toes left greater than right with hammertoe deformity noted.  There is also pain to the base of the first metatarsal with a small superficial abrasion with no signs of infection.  Gait: Mildly Antalgic gait   Assessment and Plan: Problem List Items Addressed This Visit       Endocrine   Type 2 diabetes mellitus with hyperglycemia, with long-term current use of insulin ([place])    Other Visit Diagnoses    Bone spur    -  Primary   Hammer toe, unspecified laterality       Left foot pain           -Complete examination performed -Xrays reviewed -Re-Discussed treatement options for painful bone spurs and hammertoes and recent injury to the foot that is slowly resolving -Advised patient to use Ace wrap to help with any swelling to the left foot -Continue with topical Voltaren for patient to use as instructed -Patient opt for surgical management. Consent obtained for fourth and fifth hammertoe repair and midfoot exostectomy on left. Pre and Post op course explained. Risks, benefits, alternatives explained. No guarantees given or implied. Surgical booking slip submitted and provided patient with Surgical packet and info for [facility] or [facility]. Need H&P. A1c must be less than 7.5 -Patient to return to office after surgery.  Landis Martins, DPM

## 2020-04-21 ENCOUNTER — Telehealth: Payer: Self-pay

## 2020-04-21 NOTE — Telephone Encounter (Signed)
DOS 05/04/2020  TARSAL EXOSTECTOMY LT - 26378 HAMMERTOE REPAIR 4,5TH LT - 58850  Procedure 27741 Correction of toe joint deformity 28786 Correction of toe joint deformity Diagnosis M25.775 Osteophyte, left foot (primary) M20.42 Other hammer toe(s) (acquired), left foot Confirmation date May 04, 2020 - Jun 03, 2020 Facility [facility] [employer] [facility] number [id number]  PER [facility] [employer] NO PRECERT REQUIRED FOR CPT [phone]

## 2020-04-27 ENCOUNTER — Encounter (HOSPITAL_BASED_OUTPATIENT_CLINIC_OR_DEPARTMENT_OTHER): Payer: Self-pay | Admitting: Sports Medicine

## 2020-04-27 ENCOUNTER — Other Ambulatory Visit: Payer: Self-pay | Admitting: Sports Medicine

## 2020-04-27 ENCOUNTER — Other Ambulatory Visit: Payer: Self-pay

## 2020-04-27 DIAGNOSIS — M779 Enthesopathy, unspecified: Secondary | ICD-10-CM

## 2020-04-27 NOTE — Progress Notes (Signed)
Order for knee scooter placed

## 2020-04-28 ENCOUNTER — Telehealth: Payer: Self-pay

## 2020-04-28 NOTE — Telephone Encounter (Signed)
Evelyn Watts called to cancel her surgery with Dr. Marylene Land on 05/04/2020. She is having a hard time getting in with her PCP to get the H&P form completed. I told her to let us know when she gets and appointment and we will get her rescheduled. Notified Dr. Marylene Land.

## 2020-04-28 NOTE — Telephone Encounter (Signed)
Ok thanks 

## 2020-04-28 NOTE — Telephone Encounter (Signed)
Copied from CRM [id number]. Topic: General - Other >> Apr 27, 2020  4:04 PM Jaquita Rector A wrote: Reason for CRM: Patient called in to ask if she can possibly be seen this week she is having a surgical procedure on 05/04/20 and need to be evaluated have her A1c checked and everything. Please call on 04/28/2020 Can be reached at Ph# [phone] >> Apr 28, 2020  9:43 AM Jaquita Rector A wrote: Patient called back to say that she will be having surgery on 05/04/20 and need to be seen this week to have some work done. Please call today at Ph# [phone]   Last time I see patient was seen at [facility] was Oct 2019. Could she be seen with any provider at [facility] or does she need to be referred to mobile?

## 2020-04-30 ENCOUNTER — Other Ambulatory Visit (HOSPITAL_COMMUNITY): Payer: Medicare HMO

## 2020-05-04 ENCOUNTER — Ambulatory Visit (HOSPITAL_BASED_OUTPATIENT_CLINIC_OR_DEPARTMENT_OTHER): Admission: RE | Admit: 2020-05-04 | Payer: Medicare HMO | Source: Home / Self Care | Admitting: Sports Medicine

## 2020-05-04 SURGERY — EXCISION, EXOSTOSIS, TOE
Anesthesia: Monitor Anesthesia Care | Site: Toe | Laterality: Left

## 2020-05-14 ENCOUNTER — Encounter: Payer: Medicare HMO | Admitting: Sports Medicine

## 2020-05-21 ENCOUNTER — Encounter: Payer: Medicare HMO | Admitting: Sports Medicine

## 2020-05-28 ENCOUNTER — Ambulatory Visit: Payer: Medicare HMO | Admitting: Internal Medicine

## 2020-06-04 ENCOUNTER — Encounter: Payer: Medicare HMO | Admitting: Sports Medicine

## 2020-06-25 ENCOUNTER — Encounter: Payer: Medicare HMO | Admitting: Internal Medicine

## 2020-07-07 ENCOUNTER — Other Ambulatory Visit: Payer: Self-pay | Admitting: Sports Medicine

## 2020-07-07 ENCOUNTER — Telehealth: Payer: Self-pay | Admitting: Sports Medicine

## 2020-07-07 ENCOUNTER — Encounter: Payer: Medicare HMO | Admitting: Internal Medicine

## 2020-07-07 DIAGNOSIS — M79672 Pain in left foot: Secondary | ICD-10-CM

## 2020-07-07 MED ORDER — TRAMADOL HCL 50 MG PO TABS: 50.0000 mg | ORAL_TABLET | Freq: Three times a day (TID) | ORAL | 0 refills | Status: AC | PRN

## 2020-07-07 NOTE — Progress Notes (Signed)
Tramadol sent for pain

## 2020-07-07 NOTE — Telephone Encounter (Signed)
Pt called requesting a stronger pain med. She stated Advil, Tylenol, and ibuprofen isn't working. Please advise.

## 2020-07-07 NOTE — Telephone Encounter (Signed)
I sent tramadol to her pharmacy for her to take for pain

## 2020-07-22 ENCOUNTER — Other Ambulatory Visit: Payer: Medicare HMO

## 2020-10-03 ENCOUNTER — Encounter (HOSPITAL_COMMUNITY): Payer: Self-pay

## 2020-10-03 ENCOUNTER — Emergency Department (HOSPITAL_COMMUNITY)
Admission: EM | Admit: 2020-10-03 | Discharge: 2020-10-03 | Disposition: A | Discharge status: Home / Self Care | Payer: Medicare HMO | Attending: Emergency Medicine | Admitting: Emergency Medicine

## 2020-10-03 ENCOUNTER — Other Ambulatory Visit: Payer: Self-pay

## 2020-10-03 ENCOUNTER — Emergency Department (HOSPITAL_COMMUNITY): Payer: Medicare HMO

## 2020-10-03 DIAGNOSIS — M545 Low back pain, unspecified: Secondary | ICD-10-CM | POA: Diagnosis present

## 2020-10-03 DIAGNOSIS — Z8616 Personal history of COVID-19: Secondary | ICD-10-CM | POA: Insufficient documentation

## 2020-10-03 DIAGNOSIS — J45909 Unspecified asthma, uncomplicated: Secondary | ICD-10-CM | POA: Insufficient documentation

## 2020-10-03 DIAGNOSIS — Z7984 Long term (current) use of oral hypoglycemic drugs: Secondary | ICD-10-CM | POA: Insufficient documentation

## 2020-10-03 DIAGNOSIS — Z91040 Latex allergy status: Secondary | ICD-10-CM | POA: Diagnosis not present

## 2020-10-03 MED ORDER — ONDANSETRON 8 MG PO TBDP
8.0000 mg | ORAL_TABLET | Freq: Once | ORAL | Status: AC
  Administered 2020-10-03: 21:00:00 8 mg via ORAL
  Filled 2020-10-03: qty 1

## 2020-10-03 MED ORDER — CYCLOBENZAPRINE HCL 10 MG PO TABS: 10.0000 mg | ORAL_TABLET | Freq: Two times a day (BID) | ORAL | 0 refills | Status: DC | PRN

## 2020-10-03 MED ORDER — FENTANYL CITRATE (PF) 100 MCG/2ML IJ SOLN
100.0000 ug | Freq: Once | INTRAMUSCULAR | Status: AC
  Administered 2020-10-03: 21:00:00 100 ug via INTRAMUSCULAR
  Filled 2020-10-03: qty 2

## 2020-10-03 MED ORDER — NAPROXEN 500 MG PO TABS: 500.0000 mg | ORAL_TABLET | Freq: Two times a day (BID) | ORAL | 0 refills | Status: DC
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
## 2020-12-02 ENCOUNTER — Other Ambulatory Visit: Payer: Self-pay

## 2020-12-02 ENCOUNTER — Encounter (HOSPITAL_COMMUNITY): Payer: Self-pay

## 2020-12-02 ENCOUNTER — Emergency Department (HOSPITAL_COMMUNITY)
Admission: EM | Admit: 2020-12-02 | Discharge: 2020-12-02 | Disposition: A | Discharge status: Home / Self Care | Payer: Medicare HMO | Attending: Emergency Medicine | Admitting: Emergency Medicine

## 2020-12-02 DIAGNOSIS — E119 Type 2 diabetes mellitus without complications: Secondary | ICD-10-CM | POA: Diagnosis not present

## 2020-12-02 DIAGNOSIS — I10 Essential (primary) hypertension: Secondary | ICD-10-CM | POA: Insufficient documentation

## 2020-12-02 DIAGNOSIS — R0602 Shortness of breath: Secondary | ICD-10-CM | POA: Diagnosis present

## 2020-12-02 DIAGNOSIS — Z20822 Contact with and (suspected) exposure to covid-19: Secondary | ICD-10-CM | POA: Insufficient documentation

## 2020-12-02 DIAGNOSIS — Z7984 Long term (current) use of oral hypoglycemic drugs: Secondary | ICD-10-CM | POA: Insufficient documentation

## 2020-12-02 DIAGNOSIS — J45909 Unspecified asthma, uncomplicated: Secondary | ICD-10-CM | POA: Insufficient documentation

## 2020-12-02 DIAGNOSIS — Z8616 Personal history of COVID-19: Secondary | ICD-10-CM | POA: Diagnosis not present

## 2020-12-02 DIAGNOSIS — Z79899 Other long term (current) drug therapy: Secondary | ICD-10-CM | POA: Diagnosis not present

## 2020-12-02 DIAGNOSIS — Z794 Long term (current) use of insulin: Secondary | ICD-10-CM | POA: Insufficient documentation

## 2020-12-02 DIAGNOSIS — Z91040 Latex allergy status: Secondary | ICD-10-CM | POA: Insufficient documentation

## 2020-12-02 DIAGNOSIS — R110 Nausea: Secondary | ICD-10-CM | POA: Diagnosis not present

## 2020-12-02 LAB — SARS CORONAVIRUS 2 (TAT 6-24 HRS): SARS Coronavirus 2: NEGATIVE

## 2020-12-02 NOTE — ED Provider Notes (Signed)
[facility] DEPT Provider Note   CSN: [id number] Arrival date & time: 12/02/20  7829     History Chief Complaint  Patient presents with   Nausea    Evelyn Watts is a 45 y.o. female.  The history is provided by the patient. No language interpreter was used.   45 year old female significant history of diabetes, asthma, accompanied by husband to the ER with concerns for COVID exposure.  Patient report husband came into close contact with another coworker who recently test positive for COVID infection and is sick.  Last exposure was 2 days ago.  States she does not have any specific symptoms despite triage note mentioned that she is having shortness of breath and nausea.  She would like to be tested for COVID-19.  She has been vaccinated.  No other complaint.  Husband does not have any symptoms either but he is here requesting testing as well.  Past Medical History:  Diagnosis Date   Allergies    Asthma    Diabetes mellitus without complication [facility])     Patient Active Problem List   Diagnosis Date Noted   Essential hypertension 01/20/2020   COVID-19 virus infection 01/20/2020   Type 2 diabetes mellitus with hyperglycemia, with long-term current use of insulin ([place]) 02/14/2018   Diabetes mellitus without complication ([place]) 56/21/3086   Moderate episode of recurrent major depressive disorder ([place]) 05/24/2016   Morbid obesity ([place]) 05/24/2016    Past Surgical History:  Procedure Laterality Date   CESAREAN SECTION     4   CHOLECYSTECTOMY     HERNIA REPAIR     TONSILLECTOMY       OB History   No obstetric history on file.     Family History  Problem Relation Age of Onset   Healthy Mother    Healthy Father    Hypertension Neg Hx    Diabetes Neg Hx     Social History   Tobacco Use   Smoking status: Never   Smokeless tobacco: Never  Vaping Use   Vaping Use: Never used  Substance Use Topics   Alcohol use: No   Drug use: No     Home Medications Prior to Admission medications   Medication Sig Start Date End Date Taking? Authorizing Provider  albuterol (VENTOLIN HFA) 108 (90 Base) MCG/ACT inhaler Inhale 2 puffs into the lungs every 4 (four) hours as needed for wheezing. 02/12/20   Loura Halt A, NP  blood glucose meter kit and supplies KIT Dispense based on patient and insurance preference. Use up to four times daily as directed. (FOR ICD-9 250.00, 250.01). 10/10/18   Couture, Cortni S, PA-C  cetirizine (ZYRTEC) 10 MG tablet Take 1 tablet (10 mg total) by mouth daily. 02/12/20   Loura Halt A, NP  cyclobenzaprine (FLEXERIL) 10 MG tablet Take 1 tablet (10 mg total) by mouth 2 (two) times daily as needed for muscle spasms. 10/03/20   Sherrill Raring, PA-C  diclofenac Sodium (VOLTAREN) 1 % GEL Apply 4 g topically 4 (four) times daily. 02/20/20   Stover, Titorya, DPM  glucose blood test strip Use as instructed. Check blood glucose levels by fingerstick twice per day 11/08/19   Robinson, [place] N, PA-C  ibuprofen (ADVIL) 200 MG tablet Take 400 mg by mouth every 6 (six) hours as needed for moderate pain.    [provider]  insulin glargine (LANTUS) 100 UNIT/ML injection Inject 0.3 mLs (30 Units total) into the skin 2 (two) times daily. 08/24/19  Sherwood Gambler, MD  lidocaine (LIDODERM) 5 % Place 1 patch onto the skin daily. Remove & Discard patch within 12 hours or as directed by MD 02/28/20   Gareth Morgan, MD  liraglutide (VICTOZA) 18 MG/3ML SOPN Inject 1.8 mg into the skin daily.    [provider]  liraglutide (VICTOZA) 18 MG/3ML SOPN Inject 1.8 mg into the skin daily. 01/05/20 02/04/20  Carmin Muskrat, MD  lisinopril (PRINIVIL,ZESTRIL) 20 MG tablet Take 1 tablet (20 mg total) by mouth daily. 02/14/18   Gildardo Pounds, NP  metFORMIN (GLUCOPHAGE) 500 MG tablet Take 1,000 mg by mouth 2 (two) times daily with a meal.    [provider]  metFORMIN (GLUCOPHAGE) 500 MG tablet Take 2 tablets (1,000 mg  total) by mouth 2 (two) times daily with a meal. 01/05/20 02/04/20  Carmin Muskrat, MD  Misc. Devices MISC Please provide patient with glucerna for nutritional support. 02/14/18   Gildardo Pounds, NP  naproxen (NAPROSYN) 500 MG tablet Take 1 tablet (500 mg total) by mouth 2 (two) times daily. 10/03/20   Sherrill Raring, PA-C  nystatin (MYCOSTATIN/NYSTOP) powder Apply 1 application topically 3 (three) times daily. 11/08/19   Robinson, [place] N, PA-C  atorvastatin (LIPITOR) 20 MG tablet Take 1 tablet (20 mg total) by mouth daily. Patient not taking: Reported on 02/09/2019 02/14/18 02/12/20  Gildardo Pounds, NP    Allergies    Latex, Morphine and related, and Tape  Review of Systems   Review of Systems  All other systems reviewed and are negative.  Physical Exam Updated Vital Signs BP (!) 141/107   Pulse (!) 112   Temp 97.9 F (36.6 C)   Resp 18   Ht _0  (1.803 m)   Wt 116 kg   SpO2 100%   BMI 35.67 kg/m   Physical Exam Vitals and nursing note reviewed.  Constitutional:      General: She is not in acute distress.    Appearance: She is well-developed.  HENT:     Head: Atraumatic.  Eyes:     Conjunctiva/sclera: Conjunctivae normal.  Pulmonary:     Effort: Pulmonary effort is normal.  Musculoskeletal:     Cervical back: Neck supple.  Skin:    Findings: No rash.  Neurological:     Mental Status: She is alert.  Psychiatric:        Mood and Affect: Mood normal.    ED Results / Procedures / Treatments   Labs (all labs ordered are listed, but only abnormal results are displayed) Labs Reviewed  SARS CORONAVIRUS 2 (TAT 6-24 HRS)    EKG None  Radiology No results found.  Procedures Procedures   Medications Ordered in ED Medications - No data to display  ED Course  I have reviewed the triage vital signs and the nursing notes.  Pertinent labs & imaging results that were available during my care of the patient were reviewed by me and considered in my medical  decision making (see chart for details).    MDM Rules/Calculators/A&P                           BP (!) 141/107   Pulse (!) 112   Temp 97.9 F (36.6 C)   Resp 18   Ht _1  (1.803 m)   Wt 116 kg   SpO2 100%   BMI 35.67 kg/m   Final Clinical Impression(s) / ED Diagnoses Final diagnoses:  Contact with and (  suspected) exposure to covid-19    Rx / DC Orders ED Discharge Orders     None      Patient who has been vaccinated for COVID-19 is here requesting for COVID test when her husband report he came into close contact with another coworker that had [place].  Both patient and husband without symptoms at this time.  COVID test obtained, patient can check through [place], appropriate care instruction provided as well as return precaution.  Jarrett Chicoine was evaluated in Emergency Department on 12/02/2020 for the symptoms described in the history of present illness. She was evaluated in the context of the global COVID-19 pandemic, which necessitated consideration that the patient might be at risk for infection with the SARS-CoV-2 virus that causes COVID-19. Institutional protocols and algorithms that pertain to the evaluation of patients at risk for COVID-19 are in a state of rapid change based on information released by regulatory bodies including the CDC and federal and state organizations. These policies and algorithms were followed during the patient's care in the ED.    Domenic Moras, PA-C 16/38/45 3646    Lianne Cure, DO 80/32/12 2143

## 2020-12-02 NOTE — ED Triage Notes (Signed)
Covid exposure 2 days ago. Pt reports shob and nausea. Patient would like to be tested for COVID.

## 2020-12-02 NOTE — Discharge Instructions (Signed)
You have been tested for COVID-19, please check result through MyChart, link below.  If test positive, follow instruction below.  Recommendations for at home COVID-19 symptoms management:  Please continue isolation at home. Call [phone] to see whether you might be eligible for therapeutic antibody infusions (leave your name and they will call you back).  If have acute worsening of symptoms please go to ER/urgent care for further evaluation. Check pulse oximetry and if below 90-92% please go to ER. The following supplements MAY help:  Vitamin C 500mg twice a day and Quercetin 250-500 mg twice a day Vitamin D3 2000 - 4000 u/day B Complex vitamins Zinc 75-100 mg/day Melatonin 6-10 mg at night (the optimal dose is unknown) Aspirin 81mg/day (if no history of bleeding issues)  

## 2020-12-13 ENCOUNTER — Encounter (HOSPITAL_COMMUNITY): Payer: Self-pay | Admitting: Oncology

## 2020-12-13 ENCOUNTER — Emergency Department (HOSPITAL_COMMUNITY)
Admission: EM | Admit: 2020-12-13 | Discharge: 2020-12-13 | Disposition: A | Discharge status: Home / Self Care | Payer: Medicare HMO | Attending: Emergency Medicine | Admitting: Emergency Medicine

## 2020-12-13 ENCOUNTER — Other Ambulatory Visit: Payer: Self-pay

## 2020-12-13 ENCOUNTER — Emergency Department (HOSPITAL_COMMUNITY): Payer: Medicare HMO

## 2020-12-13 DIAGNOSIS — Z79899 Other long term (current) drug therapy: Secondary | ICD-10-CM | POA: Insufficient documentation

## 2020-12-13 DIAGNOSIS — Z7984 Long term (current) use of oral hypoglycemic drugs: Secondary | ICD-10-CM | POA: Insufficient documentation

## 2020-12-13 DIAGNOSIS — X500XXA Overexertion from strenuous movement or load, initial encounter: Secondary | ICD-10-CM | POA: Insufficient documentation

## 2020-12-13 DIAGNOSIS — Z794 Long term (current) use of insulin: Secondary | ICD-10-CM | POA: Diagnosis not present

## 2020-12-13 DIAGNOSIS — J45909 Unspecified asthma, uncomplicated: Secondary | ICD-10-CM | POA: Diagnosis not present

## 2020-12-13 DIAGNOSIS — S4991XA Unspecified injury of right shoulder and upper arm, initial encounter: Secondary | ICD-10-CM | POA: Diagnosis present

## 2020-12-13 DIAGNOSIS — Z8616 Personal history of COVID-19: Secondary | ICD-10-CM | POA: Diagnosis not present

## 2020-12-13 DIAGNOSIS — I10 Essential (primary) hypertension: Secondary | ICD-10-CM | POA: Diagnosis not present

## 2020-12-13 DIAGNOSIS — S42034A Nondisplaced fracture of lateral end of right clavicle, initial encounter for closed fracture: Secondary | ICD-10-CM | POA: Diagnosis not present

## 2020-12-13 DIAGNOSIS — E119 Type 2 diabetes mellitus without complications: Secondary | ICD-10-CM | POA: Diagnosis not present

## 2020-12-13 DIAGNOSIS — Z91040 Latex allergy status: Secondary | ICD-10-CM | POA: Diagnosis not present

## 2020-12-13 MED ORDER — IBUPROFEN 600 MG PO TABS: 600.0000 mg | ORAL_TABLET | Freq: Four times a day (QID) | ORAL | 0 refills | Status: DC | PRN

## 2020-12-13 MED ORDER — OXYCODONE-ACETAMINOPHEN 5-325 MG PO TABS
1.0000 | ORAL_TABLET | Freq: Four times a day (QID) | ORAL | 0 refills | Status: DC | PRN
Start: 2020-12-13 — End: 2022-08-13

## 2020-12-13 MED ORDER — OXYCODONE-ACETAMINOPHEN 5-325 MG PO TABS
1.0000 | ORAL_TABLET | Freq: Once | ORAL | Status: AC
  Administered 2020-12-13: 1 via ORAL
  Filled 2020-12-13: qty 1

## 2020-12-13 NOTE — ED Provider Notes (Signed)
[facility] DEPT Provider Note   CSN: [id number] Arrival date & time: 12/13/20  1809     History Chief Complaint  Patient presents with   Shoulder Pain    Evelyn Watts is a 45 y.o. female.  The history is provided by the patient. No language interpreter was used.  Shoulder Pain Associated symptoms: no fever    45 year old female significant history of diabetes, obesity, hypertension, brought here via EMS from home for evaluation of right shoulder pain.  Patient report a week ago she was trying to help her daughter lift Some heavy objects.  She did not recall injuring her shoulder at that time but the next day she noticed quite a bit of pain about the shoulder.  Pain is sharp throbbing constant worse with any kind of movement.  She rates pain 10 out of 10.  Pain is nonradiating.  She tries multiple over-the-counter medication including Tylenol 3 ibuprofen Motrin Aleve he denies without adequate relief thus prompting this ER visit.  No complaint of neck pain headache elbow pain or wrist pain.  She is right-hand dominant.  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  Past Medical History:  Diagnosis Date   Allergies    Asthma    Diabetes mellitus without complication [facility])     Patient Active Problem List   Diagnosis Date Noted   Essential hypertension 01/20/2020   COVID-19 virus infection 01/20/2020   Type 2 diabetes mellitus with hyperglycemia, with long-term current use of insulin ([place]) 02/14/2018   Diabetes mellitus without complication ([place]) 59/93/5701   Moderate episode of recurrent major depressive disorder ([place]) 05/24/2016   Morbid obesity ([place]) 05/24/2016    Past Surgical History:  Procedure Laterality Date   CESAREAN SECTION     4   CHOLECYSTECTOMY     HERNIA REPAIR     TONSILLECTOMY       OB History   No obstetric history on file.     Family History  Problem Relation Age of Onset   Healthy Mother    Healthy  Father    Hypertension Neg Hx    Diabetes Neg Hx     Social History   Tobacco Use   Smoking status: Never   Smokeless tobacco: Never  Vaping Use   Vaping Use: Never used  Substance Use Topics   Alcohol use: No   Drug use: No    Home Medications Prior to Admission medications   Medication Sig Start Date End Date Taking? Authorizing Provider  albuterol (VENTOLIN HFA) 108 (90 Base) MCG/ACT inhaler Inhale 2 puffs into the lungs every 4 (four) hours as needed for wheezing. 02/12/20   Loura Halt A, NP  blood glucose meter kit and supplies KIT Dispense based on patient and insurance preference. Use up to four times daily as directed. (FOR ICD-9 250.00, 250.01). 10/10/18   Couture, Cortni S, PA-C  cetirizine (ZYRTEC) 10 MG tablet Take 1 tablet (10 mg total) by mouth daily. 02/12/20   Loura Halt A, NP  cyclobenzaprine (FLEXERIL) 10 MG tablet Take 1 tablet (10 mg total) by mouth 2 (two) times daily as needed for muscle spasms. 10/03/20   Sherrill Raring, PA-C  diclofenac Sodium (VOLTAREN) 1 % GEL Apply 4 g topically 4 (four) times daily. 02/20/20   Stover, Titorya, DPM  glucose blood test strip Use as instructed. Check blood glucose levels by fingerstick twice per day 11/08/19   Robinson, [place] N, PA-C  ibuprofen (ADVIL) 200 MG  tablet Take 400 mg by mouth every 6 (six) hours as needed for moderate pain.    [provider]  insulin glargine (LANTUS) 100 UNIT/ML injection Inject 0.3 mLs (30 Units total) into the skin 2 (two) times daily. 08/24/19   Sherwood Gambler, MD  lidocaine (LIDODERM) 5 % Place 1 patch onto the skin daily. Remove & Discard patch within 12 hours or as directed by MD 02/28/20   Gareth Morgan, MD  liraglutide (VICTOZA) 18 MG/3ML SOPN Inject 1.8 mg into the skin daily.    [provider]  liraglutide (VICTOZA) 18 MG/3ML SOPN Inject 1.8 mg into the skin daily. 01/05/20 02/04/20  Carmin Muskrat, MD  lisinopril (PRINIVIL,ZESTRIL) 20 MG tablet Take 1 tablet (20 mg  total) by mouth daily. 02/14/18   Gildardo Pounds, NP  metFORMIN (GLUCOPHAGE) 500 MG tablet Take 1,000 mg by mouth 2 (two) times daily with a meal.    [provider]  metFORMIN (GLUCOPHAGE) 500 MG tablet Take 2 tablets (1,000 mg total) by mouth 2 (two) times daily with a meal. 01/05/20 02/04/20  Carmin Muskrat, MD  Misc. Devices MISC Please provide patient with glucerna for nutritional support. 02/14/18   Gildardo Pounds, NP  naproxen (NAPROSYN) 500 MG tablet Take 1 tablet (500 mg total) by mouth 2 (two) times daily. 10/03/20   Sherrill Raring, PA-C  nystatin (MYCOSTATIN/NYSTOP) powder Apply 1 application topically 3 (three) times daily. 11/08/19   Robinson, [place] N, PA-C  atorvastatin (LIPITOR) 20 MG tablet Take 1 tablet (20 mg total) by mouth daily. Patient not taking: Reported on 02/09/2019 02/14/18 02/12/20  Gildardo Pounds, NP    Allergies    Latex, Morphine and related, and Tape  Review of Systems   Review of Systems  Constitutional:  Negative for fever.  Musculoskeletal:  Positive for arthralgias.  Skin:  Negative for wound.  Neurological:  Negative for numbness.   Physical Exam Updated Vital Signs BP 103/72 (BP Location: Left Arm)   Pulse 90   Temp 98 F (36.7 C) (Oral)   Resp (!) 22   Ht '5\' 11"'  (1.803 m)   Wt 117.9 kg   LMP 11/17/2020 (Approximate)   SpO2 95%   BMI 36.26 kg/m   Physical Exam Vitals and nursing note reviewed.  Constitutional:      General: She is not in acute distress.    Appearance: She is well-developed. She is obese.  HENT:     Head: Atraumatic.  Eyes:     Conjunctiva/sclera: Conjunctivae normal.  Pulmonary:     Effort: Pulmonary effort is normal.  Musculoskeletal:        General: Tenderness (Right shoulder: Diffuse tenderness to palpation including overlying the acromion region with decreased shoulder range of motion but no obvious deformity or bruising noted.  Sensation is intact throughout.  Right elbow and right wrist nontender.)  present.     Cervical back: Neck supple.  Skin:    Capillary Refill: Capillary refill takes less than 2 seconds.     Findings: No rash.  Neurological:     Mental Status: She is alert.  Psychiatric:        Mood and Affect: Mood normal.    ED Results / Procedures / Treatments   Labs (all labs ordered are listed, but only abnormal results are displayed) Labs Reviewed - No data to display  EKG None  Radiology DG Shoulder Right  Result Date: 12/13/2020 CLINICAL DATA:  RIGHT shoulder pain for 1 week. EXAM: RIGHT SHOULDER -  2+ VIEW COMPARISON:  Chest x-ray 01/05/2020 FINDINGS: The acromion appears mildly irregular compared to prior studies. Question of os acromiale. Recommend correlation with point tenderness over the acromion. No other abnormalities. RIGHT lung apex is unremarkable. IMPRESSION: Irregular appearance of the acromion, raising the question of os acromiale. Acute fracture is not entirely excluded and correlation with point tenderness is recommended. Electronically Signed   By: Nolon Nations M.D.   On: 12/13/2020 20:59    Procedures Procedures   Medications Ordered in ED Medications  oxyCODONE-acetaminophen (PERCOCET/ROXICET) 5-325 MG per tablet 1 tablet (has no administration in time range)    ED Course  I have reviewed the triage vital signs and the nursing notes.  Pertinent labs & imaging results that were available during my care of the patient were reviewed by me and considered in my medical decision making (see chart for details).    MDM Rules/Calculators/A&P                           BP 103/72 (BP Location: Left Arm)   Pulse 90   Temp 98 F (36.7 C) (Oral)   Resp (!) 22   Ht '5\' 11"'  (1.803 m)   Wt 117.9 kg   LMP 11/17/2020 (Approximate)   SpO2 95%   BMI 36.26 kg/m   Final Clinical Impression(s) / ED Diagnoses Final diagnoses:  Traumatic closed nondisplaced fracture of acromial end of right clavicle, initial encounter    Rx / DC Orders ED  Discharge Orders          Ordered    oxyCODONE-acetaminophen (PERCOCET) 5-325 MG tablet  Every 6 hours PRN        12/13/20 2134    ibuprofen (ADVIL) 600 MG tablet  Every 6 hours PRN        12/13/20 2134           9:26 PM Patient here with right shoulder pain after heavy lifting approximately a week ago.  X-ray today shows an irregular appearance of the acromion, raising the questions of os acromiale. however acute fracture is not entirely excluded.  Patient does have tenderness along the acromion on palpation, therefore will apply sling for support and a short course of opiate pain medication for pain control.  Orthopedic referral given.     Domenic Moras, PA-C 12/13/20 2138    Daleen Bo, MD 12/13/20 2219

## 2020-12-13 NOTE — Discharge Instructions (Addendum)
You have been evaluated for your right shoulder injury.  There is a possibility that you may have a small break involving your right acromion near the clavicle.  Please wear the sling for support.  Take pain medication as needed.  If your symptoms persist for more than 3-4 weeks, consider follow-up with orthopedist for further care.

## 2020-12-13 NOTE — ED Triage Notes (Signed)
Pt bib [facility] from home d/t right shoulder pain x 1 week.  Pt reported to EMS that the pain subsided last night and came back today.  Pt was requesting pain medication a sling and some pillows to prop her arm up.

## 2020-12-31 ENCOUNTER — Other Ambulatory Visit: Payer: Self-pay | Admitting: Orthopaedic Surgery

## 2020-12-31 DIAGNOSIS — M25511 Pain in right shoulder: Secondary | ICD-10-CM

## 2021-01-13 ENCOUNTER — Other Ambulatory Visit: Payer: Self-pay

## 2021-01-13 ENCOUNTER — Ambulatory Visit
Admission: RE | Admit: 2021-01-13 | Discharge: 2021-01-13 | Disposition: A | Discharge status: Home / Self Care | Payer: Medicare HMO | Source: Ambulatory Visit | Attending: Orthopaedic Surgery | Admitting: Orthopaedic Surgery

## 2021-01-13 DIAGNOSIS — M25511 Pain in right shoulder: Secondary | ICD-10-CM

## 2021-01-28 ENCOUNTER — Ambulatory Visit: Payer: Medicare HMO | Attending: Physician Assistant | Admitting: Physical Therapy

## 2021-01-28 ENCOUNTER — Other Ambulatory Visit: Payer: Self-pay

## 2021-01-28 DIAGNOSIS — M25511 Pain in right shoulder: Secondary | ICD-10-CM | POA: Diagnosis not present

## 2021-01-28 DIAGNOSIS — M6281 Muscle weakness (generalized): Secondary | ICD-10-CM | POA: Diagnosis present

## 2021-01-28 DIAGNOSIS — G8929 Other chronic pain: Secondary | ICD-10-CM | POA: Insufficient documentation

## 2021-01-28 DIAGNOSIS — M25611 Stiffness of right shoulder, not elsewhere classified: Secondary | ICD-10-CM | POA: Insufficient documentation

## 2021-01-28 DIAGNOSIS — R252 Cramp and spasm: Secondary | ICD-10-CM | POA: Insufficient documentation

## 2021-01-28 NOTE — Therapy (Deleted)
[facility] Outpatient Rehabilitation [facility] [address] [place], [place], [address] Phone: [phone]   Fax:  [phone]  Physical Therapy Treatment  Patient Details  Name: Evelyn Watts MRN: [id number] Date of Birth: 1975/12/30 Referring Provider (PT): [place], [place]   Encounter Date: 01/28/2021   PT End of Session - 01/28/21 1884     Visit Number 1    Number of Visits 13    Date for PT Re-Evaluation 03/11/21    Authorization Type [employer] MCR/ MCD    PT Start Time 0755    PT Stop Time 0841    PT Time Calculation (min) 46 min    Activity Tolerance Patient limited by pain    Behavior During Therapy [facility] for tasks assessed/performed             Past Medical History:  Diagnosis Date   Allergies    Asthma    Diabetes mellitus without complication ([facility])     Past Surgical History:  Procedure Laterality Date   CESAREAN SECTION     4   CHOLECYSTECTOMY     HERNIA REPAIR     TONSILLECTOMY      There were no vitals filed for this visit.   Subjective Assessment - 01/28/21 0800     Subjective I am taking medication for the pain.  My r shld is so sore that I cannot move it sometime.  I dont know what happened.  I just woke up one day and it was so sore and I couldnt move it.  Around august 20 I woke up and I had so much pain. No falls or no incident. Fredia Beets trouble doing my chores.  I cant lie on my R shld for very long or hurts. I lie on my left side most of the time    Pertinent History DM, HTN, Depression   ALLERGY LATEX and Cobain/tape, Morphine    Limitations Lifting    How long can you sit comfortably? unlimited    How long can you stand comfortably? I can stand but cant use my arm    How long can you walk comfortably? I dont walk too much because my feet swell with bone spurs    Diagnostic tests 12-13-20 xray   Irregular appearance of the acromion, raising the question of os  acromiale. Acute fracture is not entirely excluded and  correlation  with point tenderness is recommendedMRI 01-13-21  No fracture .Mild tendinosis of the supraspinatus tendon with a small  partial-thickness articular surface tear.  2. Moderate tendinosis of the infraspinatus tendon.  3. Mild tendinosis of the intra-articular portion of the long head  of the biceps tendon.    Currently in Pain? Yes    Pain Score 9     Pain Location Shoulder    Pain Orientation Right    Pain Descriptors / Indicators Stabbing;Throbbing;Aching    Pain Type Chronic pain    Pain Radiating Towards down the into my scapula    Pain Onset More than a month ago    Pain Frequency Intermittent    Aggravating Factors  If I cook , household chores, laundry, lift things above head  I have to get my daughter to help with hair. hard wash    Pain Relieving Factors I rest my arm on a pillow and not move, I use gabapentin but it doesnt help that much                [facility] PT Assessment -  01/28/21 0001       Assessment   Medical Diagnosis R RTC tendonitis    Referring Provider (PT) Illene Bolus, [place]    Onset Date/Surgical Date 12/12/20   just woke up with arm pain R   Hand Dominance Right    Prior Therapy None for arm      Precautions   Precaution Comments LATEX, tape and MORPHINE allergy      Restrictions   Weight Bearing Restrictions No      Balance Screen   Has the patient fallen in the past 6 months No    Has the patient had a decrease in activity level because of a fear of falling?  No    Is the patient reluctant to leave their home because of a fear of falling?  No      Home Tourist information centre manager residence    Research officer, trade union;Children    Type of Home House    Home Access Stairs to enter    Entrance Stairs-Number of Steps 5    Entrance Stairs-Rails Right    Home Layout One level      Prior Function   Level of Independence Independent      Cognition   Overall Cognitive Status Within Functional Limits for tasks assessed       Observation/Other Assessments   Focus on Therapeutic Outcomes (FOTO)  FOTO intake      ROM / Strength   AROM / PROM / Strength AROM;Strength;PROM      AROM   Overall AROM  Deficits    Right Shoulder Flexion 110 Degrees   pain   Right Shoulder ABduction 130 Degrees    Right Shoulder Internal Rotation 40 Degrees   pain   Right Shoulder Horizontal  ADduction 105 Degrees    Left Shoulder Flexion 165 Degrees    Left Shoulder ABduction 165 Degrees    Left Shoulder Internal Rotation 60 Degrees    Left Shoulder External Rotation 92 Degrees    Left Shoulder Horizontal ADduction 142 Degrees      PROM   Right Shoulder Flexion 155 Degrees    Left Shoulder Flexion 170 Degrees    Left Shoulder ABduction 164 Degrees      Strength   Overall Strength Deficits                                                 Patient will benefit from skilled therapeutic intervention in order to improve the following deficits and impairments:     Visit Diagnosis: No diagnosis found.     Problem List Patient Active Problem List   Diagnosis Date Noted   Essential hypertension 01/20/2020   COVID-19 virus infection 01/20/2020   Type 2 diabetes mellitus with hyperglycemia, with long-term current use of insulin (HCC) 02/14/2018   Diabetes mellitus without complication (HCC) 08/24/2017   Moderate episode of recurrent major depressive disorder (HCC) 05/24/2016   Morbid obesity (HCC) 05/24/2016    Evelyn Watts, PT 01/28/2021, 8:32 AM  [facility] [address] [place], [place], [address] Phone: [phone]   Fax:  [phone]  Name: Evelyn Watts MRN: [id number] Date of Birth: [birth year]

## 2021-01-28 NOTE — Therapy (Addendum)
[place], [place], [address] Phone: [phone]   Fax:  [phone]  Physical Therapy Evaluation/discharge Note  Patient Details  Name: Evelyn Watts MRN: [id number] Date of Birth: March 16, 1976 Referring Provider (PT): [place], [place]   Encounter Date: 01/28/2021   PT End of Session - 01/28/21 2683     Visit Number 1    Number of Visits 13    Date for PT Re-Evaluation 03/11/21    Authorization Type [employer] MCR/ MCD    PT Start Time 0755    PT Stop Time 0841    PT Time Calculation (min) 46 min    Activity Tolerance Patient limited by pain    Behavior During Therapy [facility] for tasks assessed/performed             Past Medical History:  Diagnosis Date   Allergies    Asthma    Diabetes mellitus without complication ([place])     Past Surgical History:  Procedure Laterality Date   CESAREAN SECTION     4   CHOLECYSTECTOMY     HERNIA REPAIR     TONSILLECTOMY      There were no vitals filed for this visit.   Subjective Assessment - 01/28/21 0800     Subjective I am taking medication for the pain.  My r shld is so sore that I cannot move it sometime.  I dont know what happened.  I just woke up one day and it was so sore and I couldnt move it.  Around august 20 I woke up and I had so much pain. No falls or no incident. [name] trouble doing my chores.  I cant lie on my R shld for very long or hurts. I lie on my left side most of the time    Pertinent History DM, HTN, Depression obesity   ALLERGY LATEX and Cobain/tape, Morphine    Limitations Lifting    How long can you sit comfortably? unlimited    How long can you stand comfortably? I can stand but cant use my arm    How long can you walk comfortably? I dont walk too much because my feet swell with bone spurs    Diagnostic tests 12-13-20 xray   Irregular appearance of the acromion, raising the question of os  acromiale. Acute fracture is not  entirely excluded and correlation  with point tenderness is recommendedMRI 01-13-21  No fracture .Mild tendinosis of the supraspinatus tendon with a small  partial-thickness articular surface tear.  2. Moderate tendinosis of the infraspinatus tendon.  3. Mild tendinosis of the intra-articular portion of the long head  of the biceps tendon.    Currently in Pain? Yes    Pain Score 9     Pain Location Shoulder    Pain Orientation Right    Pain Descriptors / Indicators Stabbing;Throbbing;Aching    Pain Type Chronic pain    Pain Radiating Towards down the into my scapula    Pain Onset More than a month ago    Pain Frequency Intermittent    Aggravating Factors  If I cook , household chores, laundry, lift things above head  I have to get my daughter to help with hair. hard wash    Pain Relieving Factors I rest my arm on a pillow and not move, I use gabapentin but it doesnt help that much                [facility] PT  Assessment - 01/28/21 0001       Assessment   Medical Diagnosis R RTC tendonitis    Referring Provider (PT) [place], [place]    Onset Date/Surgical Date 12/12/20   just woke up with arm pain R   Hand Dominance Right    Prior Therapy None for arm      Precautions   Precaution Comments LATEX, tape and MORPHINE allergy      Restrictions   Weight Bearing Restrictions No      Balance Screen   Has the patient fallen in the past 6 months No    Has the patient had a decrease in activity level because of a fear of falling?  No    Is the patient reluctant to leave their home because of a fear of falling?  No      Home Ecologist residence    Transport planner;Children    Type of Home House    Home Access Stairs to enter    Entrance Stairs-Number of Steps 5    Entrance Stairs-Rails Right    Home Layout One level      Prior Function   Level of Independence Independent      Cognition   Overall Cognitive Status Within Functional Limits  for tasks assessed      Observation/Other Assessments   Focus on Therapeutic Outcomes (FOTO)  FOTO intake 49% predicted 67%      Sensation   Light Touch Appears Intact      Posture/Postural Control   Posture/Postural Control Postural limitations    Postural Limitations Rounded Shoulders;Forward head    Posture Comments obesity      ROM / Strength   AROM / PROM / Strength AROM;Strength;PROM      AROM   Overall AROM  Deficits    Overall AROM Comments neck cleared and WFL    Right Shoulder Flexion 110 Degrees   pain   Right Shoulder ABduction 130 Degrees    Right Shoulder Internal Rotation 40 Degrees   pain   Right Shoulder Horizontal  ADduction 105 Degrees    Left Shoulder Flexion 165 Degrees    Left Shoulder ABduction 165 Degrees    Left Shoulder Internal Rotation 60 Degrees    Left Shoulder External Rotation 92 Degrees    Left Shoulder Horizontal ADduction 142 Degrees      PROM   Overall PROM  Deficits    Right Shoulder Flexion 155 Degrees    Right Shoulder ABduction 150 Degrees    Left Shoulder Flexion 170 Degrees    Left Shoulder ABduction 164 Degrees      Strength   Overall Strength Deficits    Overall Strength Comments pain in R shld with resistance    Right Shoulder Flexion 4-/5    Right Shoulder Extension 4-/5    Right Shoulder ABduction 4-/5    Right Shoulder Internal Rotation 4-/5    Right Shoulder External Rotation 4-/5    Left Shoulder Flexion 5/5    Left Shoulder Extension 5/5    Left Shoulder ABduction 5/5    Left Shoulder Internal Rotation 5/5    Left Shoulder External Rotation 5/5      Palpation   Palpation comment TTP over distal supraspinatus . marked pain of infraspinatus and mod TTP over ant shld,  marked TTP over sup scap under arm pit  [facility] Adult PT Treatment/Exercise - 01/28/21 0001       Modalities   Modalities Iontophoresis      Iontophoresis   Type of Iontophoresis Dexamethasone     Location infraspinatus distal R shld    Dose 1cc    Time 4-6 hours patch to bermoved after 4-6 hours                     PT Education - 01/28/21 0915     Education Details POC Explanation of findings, posture intial, HEP and FOTO report explanation    Person(s) Educated Patient    Methods Explanation;Demonstration;Tactile cues;Verbal cues;Handout    Comprehension Verbalized understanding;Returned demonstration              PT Short Term Goals - 01/28/21 0850       PT SHORT TERM GOAL #1   Title Pt will be independent with HEP initial    Baseline no knowledge    Time 3    Period Weeks    Status New    Target Date 02/18/21      PT SHORT TERM GOAL #2   Title Pt will increase AROM of flexion by 10 degrees with pain 5/10 or less    Baseline 9/10 pain with 110 degrees flexion    Time 3    Period Weeks    Status New    Target Date 02/18/21      PT SHORT TERM GOAL #3   Title Demonstrate understanding of proper sitting posture, body mechanics, work Personal assistant, and be more conscious of position and posture throughout the day.    Baseline no lknowledge    Time 3    Period Weeks    Status New    Target Date 02/18/21               PT Long Term Goals - 01/28/21 0853       PT LONG TERM GOAL #1   Title Pt will be independent with advanced HEP.    Baseline no knowledge    Time 6    Period Weeks    Status New    Target Date 03/11/21      PT LONG TERM GOAL #2   Title Pt will be able to groom, dress and fix hair with 2/10 or less pain    Baseline unable to fix hair must have dtr help    Time 6    Period Weeks    Status New    Target Date 03/11/21      PT LONG TERM GOAL #3   Title FOTO will improve from  49%   to 67%    indicating improved functional mobility.    Baseline eval at 49%    Time 6    Period Weeks    Status New    Target Date 03/11/21      PT LONG TERM GOAL #4   Title R shoulder AROM scaption will improve to 0-160 degrees for improved  overhead reaching    Baseline R AROM flexion 110    Time 6    Period Weeks    Status New    Target Date 03/11/21      PT LONG TERM GOAL #5   Title LIe on R shld at night without waking due to pain for 4 or more hours of restorative sleep    Baseline only sleeps on Left arem    Time 6    Period Weeks  Status New    Target Date 03/11/21                   Plan - 01/28/21 0834     Clinical Impression Statement ms Forgy woke up about 2 months ago with R shld pain with no incident.  Ms Montemayor  presents with signs and symptoms compatible  rotator cuff tendonitis  TTP over supraspinatus, infraspinatus with marked tenderness.  mod tenderness over ant shld.. Pt presents with impairments including pain, limited ROM, weakness, and as a result, pt is limited with ADLs and functional activities. Pt would benefit from skilled outpatient PT services for 2 times a week for 6 weeks to progress toward pain-free PLOF.    Personal Factors and Comorbidities Comorbidity 2    Examination-Activity Limitations Carry;Lift;Sleep    Examination-Participation Restrictions Cleaning;Laundry;Meal Prep    Stability/Clinical Decision Making Evolving/Moderate complexity    Clinical Decision Making Moderate    Rehab Potential Excellent    PT Frequency 2x / week    PT Duration 6 weeks    PT Treatment/Interventions ADLs/Self Care Home Management;Cryotherapy;Electrical Stimulation;Iontophoresis 37m/ml Dexamethasone;Moist Heat;Ultrasound;Neuromuscular re-education;Patient/family education;Manual techniques;Taping;Dry needling;Passive range of motion;Joint Manipulations    PT Next Visit Plan Review HEP, check ionto.  manual    PT Home Exercise Plan [email or web address]             Patient will benefit from skilled therapeutic intervention in order to improve the following deficits and impairments:  Obesity, Pain, Decreased activity tolerance, Decreased range of motion, Decreased strength, Increased muscle spasms,  Improper body mechanics, Postural dysfunction, Impaired UE functional use  Visit Diagnosis: Chronic right shoulder pain - Plan: PT plan of care cert/re-cert  Muscle weakness (generalized) - Plan: PT plan of care cert/re-cert  Cramp and spasm - Plan: PT plan of care cert/re-cert  Stiffness of right shoulder, not elsewhere classified - Plan: PT plan of care cert/re-cert  Access Code: [email or web address]: 10/06/2022Prepared by: LDonnetta SimpersBeardsleyExercises  Isometric Shoulder Flexion at Wall - 2 x daily - 5 x weekly - 1 sets - 5 reps - 5 hold  Isometric Shoulder Abduction at Wall - 2 x daily - 5 x weekly - 1 sets - 5 reps - 5 hold  Isometric Shoulder External Rotation at Wall - 2 x daily - 5 x weekly - 1 sets - 5 reps - 5 hold  Standing Isometric Shoulder Internal Rotation at Doorway - 2 x daily - 5 x weekly - 1 sets - 2 reps - 2 hold  Seated Scapular Retraction - 3 x daily - 5 x weekly - 2 sets - 10 reps - 3 hold  Circular Shoulder Pendulum with Table Support - 2 x daily - 5 x weekly - 1 sets - 30 hold   Problem List Patient Active Problem List   Diagnosis Date Noted   Essential hypertension 01/20/2020   COVID-19 virus infection 01/20/2020   Type 2 diabetes mellitus with hyperglycemia, with long-term current use of insulin ([place] 02/14/2018   Diabetes mellitus without complication ([place] 072/12/4707  Moderate episode of recurrent major depressive disorder ([place] 05/24/2016   Morbid obesity ([place] 05/24/2016    LVoncille Lo PT, AAmarilloCertified Exercise Expert for the Aging Adult  01/28/21 9:28 AM Phone: [phone]: 3WarrensCSanford [facility] Center[address] CircleGLake Chaffee NAl[place] [address]Phone: [phone]  Fax:  [phone] Name: PDarlene BrozowskiMRN: [id number] of Birth: 7May 27, 1977 Referring diagnosis? R RTC tendonitis Treatment diagnosis? (if different  than referring diagnosis) R RTC  tendonitis What was this (referring dx) caused by? _0  Surgery _1  Fall _2  Ongoing issue _3  Arthritis _4  Other: __no incident__________  Laterality: _5  Rt _6  Lt _7  Both  Check all possible CPT codes:      _8  97110 (Therapeutic Exercise)  _9  92507 (SLP Treatment)  _10  13643 (Neuro Re-ed)   _11  83779 (Swallowing Treatment)   _12  39688 (Gait Training)   _13  64847 (Cognitive Training, 1st 15 minutes) _14  97140 (Manual Therapy)   _15  97130 (Cognitive Training, each add'l 15 minutes)  _16  97530 (Therapeutic Activities)  _17  Other, List CPT Code ____________    _18  20721 (Self Care)       _19  All codes above (97110 - 97535)  _20  97012 (Mechanical Traction)  _21  97014 (E-stim Unattended)  _22  97032 (E-stim manual)  _23  97033 (Ionto)  _24  97035 (Ultrasound)  _25  97760 (Orthotic Fit) _26  97750 (Physical Performance Training) _27  [id number] (Aquatic Therapy) _28  82883 (Contrast Bath) _29  37445 (Paraffin) _30  97597 (Wound Care 1st 20 sq cm) _31  97598 (Wound Care each add'l 20 sq cm) _32  97016 (Vasopneumatic Device) _33  14604 (Orthotic Training) _34  [id number] (Prosthetic Training)    PHYSICAL THERAPY DISCHARGE SUMMARY  Visits from Start of Care: 1  Current functional level related to goals / functional outcomes: unknown   Remaining deficits: Conservator, museum/gallery / Equipment: Initial HEP education   Patient agrees to discharge. Patient goals were not met. Patient is being discharged due to not returning since the last visit.  Voncille Lo, PT, [place] Certified Exercise Expert for the Aging Adult  04/13/21 3:43 PM Phone: [phone] Fax: [phone]

## 2021-01-28 NOTE — Patient Instructions (Addendum)
Posture Tips DO: - stand tall and erect - keep chin tucked in - keep head and shoulders in alignment - check posture regularly in mirror or large window - pull head back against headrest in car seat;  Change your position often.  Sit with lumbar support. DON'T: - slouch or slump while watching TV or reading - sit, stand or lie in one position  for too long;  Sitting is especially hard on the spine so if you sit at a desk/use the computer, then stand up often!   Copyright  VHI. All rights reserved.  Posture - Standing   Good posture is important. Avoid slouching and forward head thrust. Maintain curve in low back and align ears over shoul- ders, hips over ankles.  Pull your belly button in toward your back bone. Stand with ribs lifted up and chin down   Copyright  VHI. All rights reserved.  Posture - Sitting   Sit upright, head facing forward. Try using a roll to support lower back. Keep shoulders relaxed, and avoid rounded back. Keep hips level with knees. Avoid crossing legs for long periods. Dont cross your legs.  Sit on sit bones not tailbone  use a lumbar support for good alignement  IONTOPHORESIS PATIENT PRECAUTIONS & CONTRAINDICATIONS:  Redness under one or both electrodes can occur.  This characterized by a uniform redness that usually disappears within 12 hours of treatment. Small pinhead size blisters may result in response to the drug.  Contact your physician if the problem persists more than 24 hours. On rare occasions, iontophoresis therapy can result in temporary skin reactions such as rash, inflammation, irritation or burns.  The skin reactions may be the result of individual sensitivity to the ionic solution used, the condition of the skin at the start of treatment, reaction to the materials in the electrodes, allergies or sensitivity to dexamethasone, or a poor connection between the patch and your skin.  Discontinue using iontophoresis if you have any of these reactions  and report to your therapist. Remove the Patch or electrodes if you have any undue sensation of pain or burning during the treatment and report discomfort to your therapist. Tell your Therapist if you have had known adverse reactions to the application of electrical current. If using the Patch, the LED light will turn off when treatment is complete and the patch can be removed.  Approximate treatment time is 1-3 hours.  Remove the patch when light goes off or after 6 hours. The Patch can be worn during normal activity, however excessive motion where the electrodes have been placed can cause poor contact between the skin and the electrode or uneven electrical current resulting in greater risk of skin irritation. Keep out of the reach of children.   DO NOT use if you have a cardiac pacemaker or any other electrically sensitive implanted device. DO NOT use if you have a known sensitivity to dexamethasone. DO NOT use during Magnetic Resonance Imaging (MRI). DO NOT use over broken or compromised skin (e.g. sunburn, cuts, or acne) due to the increased risk of skin reaction. DO NOT SHAVE over the area to be treated:  To establish good contact between the Patch and the skin, excessive hair may be clipped. DO NOT place the Patch or electrodes on or over your eyes, directly over your heart, or brain. DO NOT reuse the Patch or electrodes as this may cause burns to occur.    Copyright  VHI. All rights reserved.    Evelyn Watts  Ophelia Charter, ATRIC Certified Exercise Expert for the Aging Adult  01/28/21 8:37 AM Phone: [phone] Fax: [phone]

## 2021-02-10 ENCOUNTER — Telehealth: Payer: Self-pay | Admitting: Physical Therapy

## 2021-02-10 ENCOUNTER — Ambulatory Visit: Payer: Medicare HMO | Admitting: Physical Therapy

## 2021-02-10 NOTE — Telephone Encounter (Signed)
Contacted patient regarding no-show. Patient reported that she forgot about her appointment because her baby was sick. She declined to reschedule and plans to attend her future appointments.

## 2021-02-16 ENCOUNTER — Telehealth: Payer: Self-pay | Admitting: Physical Therapy

## 2021-02-16 ENCOUNTER — Ambulatory Visit: Payer: Medicare HMO | Admitting: Physical Therapy

## 2021-02-16 NOTE — Telephone Encounter (Signed)
LVM reminding pt of missed appt/ NS for 11:00 am today.  Pt reminded of cancellation/ No show  policy and reminded of appt on Thursday 02-18-21 and 11:00 AM   Garen Lah, PT, ATRIC Certified Exercise Expert for the Aging Adult  02/16/21 11:12 AM Phone: [phone] Fax: [phone]

## 2021-02-18 ENCOUNTER — Encounter: Payer: Medicare HMO | Admitting: Physical Therapy

## 2021-02-18 ENCOUNTER — Telehealth: Payer: Self-pay | Admitting: Physical Therapy

## 2021-02-18 NOTE — Telephone Encounter (Signed)
PT LVM for Ms Evelyn Watts regarding her 3rd no show appt today at 11:00.  Pt was previously left voice mail to call clinic to let us know if she wishes to continue RX.  Due to NO Show/cancellation policy, all future appt are cancelled and pt was told she can call to schedule appt one at a time at a time that may be more convenient.   For now all future appt are cancelled. Pt was left number to call for appt.  Garen Lah, PT, ATRIC Certified Exercise Expert for the Aging Adult  02/18/21 11:40 AM Phone: [phone] Fax: [phone]

## 2021-02-23 ENCOUNTER — Encounter: Payer: Medicare HMO | Admitting: Physical Therapy

## 2021-02-25 ENCOUNTER — Encounter: Payer: Medicare HMO | Admitting: Physical Therapy

## 2021-03-02 ENCOUNTER — Encounter: Payer: Medicare HMO | Admitting: Physical Therapy

## 2021-03-04 ENCOUNTER — Encounter: Payer: Medicare HMO | Admitting: Physical Therapy

## 2021-03-10 ENCOUNTER — Encounter: Payer: Medicare HMO | Admitting: Physical Therapy

## 2021-03-11 ENCOUNTER — Encounter: Payer: Medicare HMO | Admitting: Physical Therapy

## 2021-03-16 ENCOUNTER — Encounter: Payer: Medicare HMO | Admitting: Physical Therapy

## 2021-04-23 ENCOUNTER — Encounter (HOSPITAL_COMMUNITY): Payer: Self-pay | Admitting: Emergency Medicine

## 2021-04-23 ENCOUNTER — Emergency Department (HOSPITAL_COMMUNITY): Payer: Medicare HMO

## 2021-04-23 ENCOUNTER — Emergency Department (HOSPITAL_COMMUNITY)
Admission: EM | Admit: 2021-04-23 | Discharge: 2021-04-23 | Disposition: A | Discharge status: Home / Self Care | Payer: Medicare HMO | Attending: Emergency Medicine | Admitting: Emergency Medicine

## 2021-04-23 DIAGNOSIS — E119 Type 2 diabetes mellitus without complications: Secondary | ICD-10-CM | POA: Insufficient documentation

## 2021-04-23 DIAGNOSIS — U071 COVID-19: Secondary | ICD-10-CM | POA: Insufficient documentation

## 2021-04-23 DIAGNOSIS — J45909 Unspecified asthma, uncomplicated: Secondary | ICD-10-CM | POA: Insufficient documentation

## 2021-04-23 DIAGNOSIS — Z8616 Personal history of COVID-19: Secondary | ICD-10-CM | POA: Diagnosis not present

## 2021-04-23 DIAGNOSIS — I10 Essential (primary) hypertension: Secondary | ICD-10-CM | POA: Diagnosis not present

## 2021-04-23 DIAGNOSIS — M791 Myalgia, unspecified site: Secondary | ICD-10-CM | POA: Diagnosis present

## 2021-04-23 DIAGNOSIS — Z91040 Latex allergy status: Secondary | ICD-10-CM | POA: Insufficient documentation

## 2021-04-23 DIAGNOSIS — Z7984 Long term (current) use of oral hypoglycemic drugs: Secondary | ICD-10-CM | POA: Insufficient documentation

## 2021-04-23 DIAGNOSIS — J189 Pneumonia, unspecified organism: Secondary | ICD-10-CM | POA: Insufficient documentation

## 2021-04-23 LAB — CBC WITH DIFFERENTIAL/PLATELET
Abs Immature Granulocytes: 0.22 10*3/uL — ABNORMAL HIGH (ref 0.00–0.07)
Basophils Absolute: 0.1 10*3/uL (ref 0.0–0.1)
Basophils Relative: 0 %
Eosinophils Absolute: 0 10*3/uL (ref 0.0–0.5)
Eosinophils Relative: 0 %
HCT: 32.6 % — ABNORMAL LOW (ref 36.0–46.0)
Hemoglobin: 11 g/dL — ABNORMAL LOW (ref 12.0–15.0)
Immature Granulocytes: 1 %
Lymphocytes Relative: 6 %
Lymphs Abs: 1.1 10*3/uL (ref 0.7–4.0)
MCH: 25.8 pg — ABNORMAL LOW (ref 26.0–34.0)
MCHC: 33.7 g/dL (ref 30.0–36.0)
MCV: 76.3 fL — ABNORMAL LOW (ref 80.0–100.0)
Monocytes Absolute: 0.9 10*3/uL (ref 0.1–1.0)
Monocytes Relative: 5 %
Neutro Abs: 15.1 10*3/uL — ABNORMAL HIGH (ref 1.7–7.7)
Neutrophils Relative %: 88 %
Platelets: 349 10*3/uL (ref 150–400)
RBC: 4.27 MIL/uL (ref 3.87–5.11)
RDW: 14.6 % (ref 11.5–15.5)
WBC: 17.3 10*3/uL — ABNORMAL HIGH (ref 4.0–10.5)
nRBC: 0.1 % (ref 0.0–0.2)

## 2021-04-23 LAB — BASIC METABOLIC PANEL
Anion gap: 15 (ref 5–15)
BUN: <5 mg/dL — ABNORMAL LOW (ref 6–20)
CO2: 20 mmol/L — ABNORMAL LOW (ref 22–32)
Calcium: 8.5 mg/dL — ABNORMAL LOW (ref 8.9–10.3)
Chloride: 93 mmol/L — ABNORMAL LOW (ref 98–111)
Creatinine, Ser: 0.4 mg/dL — ABNORMAL LOW (ref 0.44–1.00)
GFR, Estimated: >60 mL/min (ref >60)
Glucose, Bld: 339 mg/dL — ABNORMAL HIGH (ref 70–99)
Potassium: 3.3 mmol/L — ABNORMAL LOW (ref 3.5–5.1)
Sodium: 128 mmol/L — ABNORMAL LOW (ref 135–145)

## 2021-04-23 LAB — HCG, QUANTITATIVE, PREGNANCY: hCG, Beta Chain, Quant, S: <1 m[IU]/mL (ref <5)

## 2021-04-23 LAB — RESP PANEL BY RT-PCR (FLU A&B, COVID) ARPGX2
Influenza A by PCR: NEGATIVE
Influenza B by PCR: NEGATIVE
SARS Coronavirus 2 by RT PCR: POSITIVE — AB

## 2021-04-23 MED ORDER — AEROCHAMBER Z-STAT PLUS/MEDIUM MISC
1.0000 | Freq: Once | Status: AC
  Administered 2021-04-23: 20:00:00 1
  Filled 2021-04-23: qty 1

## 2021-04-23 MED ORDER — SODIUM CHLORIDE 0.9 % IV BOLUS
1000.0000 mL | Freq: Once | INTRAVENOUS | Status: AC
  Administered 2021-04-23: 21:00:00 1000 mL via INTRAVENOUS

## 2021-04-23 MED ORDER — ALBUTEROL SULFATE HFA 108 (90 BASE) MCG/ACT IN AERS
4.0000 | INHALATION_SPRAY | Freq: Once | RESPIRATORY_TRACT | Status: AC
  Administered 2021-04-23: 20:00:00 4 via RESPIRATORY_TRACT
  Filled 2021-04-23: qty 6.7

## 2021-04-23 MED ORDER — DOXYCYCLINE HYCLATE 100 MG PO CAPS: 100.0000 mg | ORAL_CAPSULE | Freq: Two times a day (BID) | ORAL | 0 refills | Status: DC

## 2021-04-23 MED ORDER — POTASSIUM CHLORIDE CRYS ER 20 MEQ PO TBCR
40.0000 meq | EXTENDED_RELEASE_TABLET | Freq: Once | ORAL | Status: AC
  Administered 2021-04-23: 20:00:00 40 meq via ORAL
  Filled 2021-04-23: qty 2

## 2021-04-23 NOTE — ED Notes (Signed)
Pt able to ambulate to the bathroom with normal oxygen level.

## 2021-04-23 NOTE — ED Notes (Signed)
Pt A&OX4 ambulatory at d/c with independent steady gait, NAD. Pt verbalized understanding of d/c instructions, prescription and follow up care. 

## 2021-04-23 NOTE — ED Provider Notes (Signed)
[facility] DEPT Provider Note   CSN: [id number] Arrival date & time: 04/23/21  1350     History Chief Complaint  Patient presents with   Generalized Body Aches   Cough    Evelyn Watts is a 45 y.o. female.  HPI Patient is a 45 year old female with history of diabetes mellitus, asthma, obesity, who presents to the emergency department due to fatigue.  Patient states for the past week she has been experiencing dry cough, shortness of breath, wheezing, chills.  Denies chest pain, abdominal pain, nausea, vomiting, diarrhea.  She has been taking NyQuil without significant relief.  Patient states she has been vaccinated for COVID-19 x3 and also notes a previous COVID-19 infection last year.  She notes that her husband has been coughing as well.    Past Medical History:  Diagnosis Date   Allergies    Asthma    Diabetes mellitus without complication [facility])     Patient Active Problem List   Diagnosis Date Noted   Essential hypertension 01/20/2020   COVID-19 virus infection 01/20/2020   Type 2 diabetes mellitus with hyperglycemia, with long-term current use of insulin ([place]) 02/14/2018   Diabetes mellitus without complication ([place]) 40/98/1191   Moderate episode of recurrent major depressive disorder ([place]) 05/24/2016   Morbid obesity ([place]) 05/24/2016    Past Surgical History:  Procedure Laterality Date   CESAREAN SECTION     4   CHOLECYSTECTOMY     HERNIA REPAIR     TONSILLECTOMY       OB History   No obstetric history on file.     Family History  Problem Relation Age of Onset   Healthy Mother    Healthy Father    Hypertension Neg Hx    Diabetes Neg Hx     Social History   Tobacco Use   Smoking status: Never   Smokeless tobacco: Never  Vaping Use   Vaping Use: Never used  Substance Use Topics   Alcohol use: No   Drug use: No    Home Medications Prior to Admission medications   Medication Sig Start Date End Date Taking?  Authorizing Provider  doxycycline (VIBRAMYCIN) 100 MG capsule Take 1 capsule (100 mg total) by mouth 2 (two) times daily. 04/23/21  Yes Rayna Sexton, PA-C  albuterol (VENTOLIN HFA) 108 (90 Base) MCG/ACT inhaler Inhale 2 puffs into the lungs every 4 (four) hours as needed for wheezing. 02/12/20   Loura Halt A, NP  blood glucose meter kit and supplies KIT Dispense based on patient and insurance preference. Use up to four times daily as directed. (FOR ICD-9 250.00, 250.01). 10/10/18   Couture, Cortni S, PA-C  cetirizine (ZYRTEC) 10 MG tablet Take 1 tablet (10 mg total) by mouth daily. 02/12/20   Loura Halt A, NP  cyclobenzaprine (FLEXERIL) 10 MG tablet Take 1 tablet (10 mg total) by mouth 2 (two) times daily as needed for muscle spasms. 10/03/20   Sherrill Raring, PA-C  diclofenac Sodium (VOLTAREN) 1 % GEL Apply 4 g topically 4 (four) times daily. 02/20/20   Stover, Titorya, DPM  glucose blood test strip Use as instructed. Check blood glucose levels by fingerstick twice per day 11/08/19   Robinson, [place] N, PA-C  ibuprofen (ADVIL) 600 MG tablet Take 1 tablet (600 mg total) by mouth every 6 (six) hours as needed for moderate pain. 12/13/20   Domenic Moras, PA-C  insulin glargine (LANTUS) 100 UNIT/ML injection Inject 0.3 mLs (30 Units total) into the skin 2 (  two) times daily. 08/24/19   Sherwood Gambler, MD  lidocaine (LIDODERM) 5 % Place 1 patch onto the skin daily. Remove & Discard patch within 12 hours or as directed by MD Patient not taking: Reported on 01/28/2021 02/28/20   Gareth Morgan, MD  liraglutide (VICTOZA) 18 MG/3ML SOPN Inject 1.8 mg into the skin daily.    [provider]  liraglutide (VICTOZA) 18 MG/3ML SOPN Inject 1.8 mg into the skin daily. 01/05/20 02/04/20  Carmin Muskrat, MD  lisinopril (PRINIVIL,ZESTRIL) 20 MG tablet Take 1 tablet (20 mg total) by mouth daily. 02/14/18   Gildardo Pounds, NP  metFORMIN (GLUCOPHAGE) 500 MG tablet Take 1,000 mg by mouth 2 (two) times daily with a  meal.    [provider]  metFORMIN (GLUCOPHAGE) 500 MG tablet Take 2 tablets (1,000 mg total) by mouth 2 (two) times daily with a meal. 01/05/20 02/04/20  Carmin Muskrat, MD  Misc. Devices MISC Please provide patient with glucerna for nutritional support. 02/14/18   Gildardo Pounds, NP  naproxen (NAPROSYN) 500 MG tablet Take 1 tablet (500 mg total) by mouth 2 (two) times daily. Patient not taking: Reported on 01/28/2021 10/03/20   Sherrill Raring, PA-C  nystatin (MYCOSTATIN/NYSTOP) powder Apply 1 application topically 3 (three) times daily. Patient not taking: Reported on 01/28/2021 11/08/19   Robinson, [place] N, PA-C  oxyCODONE-acetaminophen (PERCOCET) 5-325 MG tablet Take 1 tablet by mouth every 6 (six) hours as needed for moderate pain or severe pain. 12/13/20   Domenic Moras, PA-C  atorvastatin (LIPITOR) 20 MG tablet Take 1 tablet (20 mg total) by mouth daily. Patient not taking: Reported on 02/09/2019 02/14/18 02/12/20  Gildardo Pounds, NP    Allergies    Latex, Morphine and related, and Tape  Review of Systems   Review of Systems  All other systems reviewed and are negative. Ten systems reviewed and are negative for acute change, except as noted in the HPI.   Physical Exam Updated Vital Signs BP (!) 157/96    Pulse 100    Temp 98.8 F (37.1 C) (Oral)    Resp 18    SpO2 99%   Physical Exam Vitals and nursing note reviewed.  Constitutional:      General: She is not in acute distress.    Appearance: Normal appearance. She is not ill-appearing, toxic-appearing or diaphoretic.  HENT:     Head: Normocephalic and atraumatic.     Right Ear: External ear normal.     Left Ear: External ear normal.     Nose: Nose normal.     Mouth/Throat:     Mouth: Mucous membranes are moist.     Pharynx: Oropharynx is clear. No oropharyngeal exudate or posterior oropharyngeal erythema.  Eyes:     General: No scleral icterus.       Right eye: No discharge.        Left eye: No discharge.      Extraocular Movements: Extraocular movements intact.     Conjunctiva/sclera: Conjunctivae normal.  Cardiovascular:     Rate and Rhythm: Regular rhythm. Tachycardia present.     Pulses: Normal pulses.     Heart sounds: Normal heart sounds. No murmur heard.   No friction rub. No gallop.  Pulmonary:     Effort: Pulmonary effort is normal. No respiratory distress.     Breath sounds: Normal breath sounds. No stridor. No wheezing, rhonchi or rales.  Abdominal:     General: Abdomen is flat.     Palpations: Abdomen  is soft.     Tenderness: There is no abdominal tenderness.  Musculoskeletal:        General: No tenderness. Normal range of motion.     Cervical back: Normal range of motion and neck supple. No tenderness.     Right lower leg: No edema.     Left lower leg: No edema.  Skin:    General: Skin is warm and dry.  Neurological:     General: No focal deficit present.     Mental Status: She is alert and oriented to person, place, and time.  Psychiatric:        Mood and Affect: Mood normal.        Behavior: Behavior normal.   ED Results / Procedures / Treatments   Labs (all labs ordered are listed, but only abnormal results are displayed) Labs Reviewed  RESP PANEL BY RT-PCR (FLU A&B, COVID) ARPGX2 - Abnormal; Notable for the following components:      Result Value   SARS Coronavirus 2 by RT PCR POSITIVE (*)    All other components within normal limits  CBC WITH DIFFERENTIAL/PLATELET - Abnormal; Notable for the following components:   WBC 17.3 (*)    Hemoglobin 11.0 (*)    HCT 32.6 (*)    MCV 76.3 (*)    MCH 25.8 (*)    Neutro Abs 15.1 (*)    Abs Immature Granulocytes 0.22 (*)    All other components within normal limits  BASIC METABOLIC PANEL - Abnormal; Notable for the following components:   Sodium 128 (*)    Potassium 3.3 (*)    Chloride 93 (*)    CO2 20 (*)    Glucose, Bld 339 (*)    BUN <5 (*)    Creatinine, Ser 0.40 (*)    Calcium 8.5 (*)    All other components  within normal limits  HCG, QUANTITATIVE, PREGNANCY   EKG None  Radiology DG Chest 2 View  Result Date: 04/23/2021 CLINICAL DATA:  Flu-like symptoms and shortness of breath for the past week. EXAM: CHEST - 2 VIEW COMPARISON:  Chest x-ray dated January 19, 2020. FINDINGS: The heart size and mediastinal contours are within normal limits. Normal pulmonary vascularity. Few subtle hazy ill-defined opacities at the lung bases. No focal consolidation, pleural effusion, or pneumothorax. No acute osseous abnormality. IMPRESSION: 1. Few subtle hazy ill-defined opacities at the lung bases could reflect atypical infection. Electronically Signed   By: Titus Dubin M.D.   On: 04/23/2021 14:34    Procedures Procedures   Medications Ordered in ED Medications  albuterol (VENTOLIN HFA) 108 (90 Base) MCG/ACT inhaler 4 puff (4 puffs Inhalation Given 04/23/21 2004)  aerochamber Z-Stat Plus/medium 1 each (1 each Other Given 04/23/21 2004)  potassium chloride SA (KLOR-CON M) CR tablet 40 mEq (40 mEq Oral Given 04/23/21 2005)  sodium chloride 0.9 % bolus 1,000 mL (1,000 mLs Intravenous New Bag/Given 04/23/21 2103)    ED Course  I have reviewed the triage vital signs and the nursing notes.  Pertinent labs & imaging results that were available during my care of the patient were reviewed by me and considered in my medical decision making (see chart for details).  Clinical Course as of 04/23/21 2341  Fri Apr 23, 2021  1945 Sodium(!): 128 Sodium corrected for hyperglycemia is 132 [LJ]  2122 Patient ambulated with pulse ox.  Oxygen saturations remained above 98% on room air. [LJ]    Clinical Course User Index [LJ] Rayna Sexton, PA-C  MDM Rules/Calculators/A&P                          Pt is a 45 y.o. female who presents to the emergency department what appears to be symptoms related to COVID-19.  States they started about 1 week ago.  Labs: CBC with a white count of 17.3, hemoglobin of 11, MCV  of 76.3, neutrophils of 15.1, absolute immature granulocytes of 0.22. BMP with a sodium of 128, potassium of 3.3, chloride 93, CO2 20, glucose of 239, BUN less than 5, creatinine of 0.4, calcium of 8.5. Quantitative hCG less than 1. Respiratory panel was positive for COVID-19.  Imaging: Chest x-ray shows few subtle hazy ill-defined opacities at the lung bases which could reflect atypical infection.  I, Rayna Sexton, PA-C, personally reviewed and evaluated these images and lab results as part of my medical decision-making.  Patient initially mildly tachycardic.  Lungs were clear to auscultation bilaterally.  Abdomen is soft and nontender.  Nontoxic-appearing.  Afebrile.  Mildly hypokalemic at 3.3 which was repleted with Klor-Con.  Sodium of 128 which when corrected for patient's hyperglycemia appears to be around 132.  Patient given 1 L of normal saline.  Patient ambulated with pulse ox and oxygen saturations remained above 98%.  Given the length of her symptoms will not initiate Paxlovid.  Given the concern for possible atypical infection on her chest x-ray will discharge on a course of doxycycline.  Feel the patient is stable for discharge at this time and she is agreeable.  We discussed return precautions.  Her questions were answered and she was amicable at the time of discharge.  Note: Portions of this report may have been transcribed using voice recognition software. Every effort was made to ensure accuracy; however, inadvertent computerized transcription errors may be present.   Final Clinical Impression(s) / ED Diagnoses Final diagnoses:  [id number]  Community acquired pneumonia, unspecified laterality   Rx / DC Orders ED Discharge Orders          Ordered    doxycycline (VIBRAMYCIN) 100 MG capsule  2 times daily        04/23/21 2319             Rayna Sexton, PA-C 04/23/21 2343    Daleen Bo, MD 04/24/21 [phone]

## 2021-04-23 NOTE — Discharge Instructions (Addendum)
I am prescribing you an antibiotic.  This is called doxycycline.  Please take this twice a day for the next 10 days.  Do not stop taking this early.  Please follow-up with your regular doctor regarding your diagnosis.  If you develop any new or worsening symptoms please come back to the emergency department.

## 2021-04-23 NOTE — ED Provider Notes (Signed)
Emergency Medicine Provider Triage Evaluation Note  Evelyn Watts , a 45 y.o. female  was evaluated in triage.  Pt complains of fatigue.  Patient states that since last week she has had flulike symptoms.  She states that she has had intermittent nonproductive cough as well as feeling short of breath.  She does endorse hot and cold chills.  She also states that she has been very fatigued feeling like she wants to lay down all the time.  She endorses decreased p.o. intake but is still tolerating liquids.  She states she has been taking NyQuil without relief of symptoms.  Review of Systems  Positive: See above Negative:   Physical Exam  BP (!) 149/90 (BP Location: Right Arm)    Pulse (!) 103    Temp 98.8 F (37.1 C) (Oral)    Resp 18    SpO2 95%  Gen:   Awake, no distress, ill-appearing Resp:  Normal effort, lung sounds clear to auscultation bilaterally MSK:   Moves extremities without difficulty  Other:  Oropharynx without erythema or tonsillar swelling or exudates  Medical Decision Making  Medically screening exam initiated at 2:08 PM.  Appropriate orders placed.  Sharaya Boruff was informed that the remainder of the evaluation will be completed by another provider, this initial triage assessment does not replace that evaluation, and the importance of remaining in the ED until their evaluation is complete.     Cristopher Peru, PA-C 04/23/21 1409    Wynetta Fines, MD 04/23/21 [phone]

## 2021-04-23 NOTE — ED Triage Notes (Signed)
Per EMS, patient from home c/o flu-like sx with SOB x 1 week.  96% RA

## 2021-05-29 ENCOUNTER — Emergency Department (HOSPITAL_COMMUNITY)
Admission: EM | Admit: 2021-05-29 | Discharge: 2021-05-29 | Disposition: A | Discharge status: Home / Self Care | Payer: Medicare HMO | Attending: Emergency Medicine | Admitting: Emergency Medicine

## 2021-05-29 ENCOUNTER — Other Ambulatory Visit: Payer: Self-pay

## 2021-05-29 DIAGNOSIS — Z7984 Long term (current) use of oral hypoglycemic drugs: Secondary | ICD-10-CM | POA: Diagnosis not present

## 2021-05-29 DIAGNOSIS — H5711 Ocular pain, right eye: Secondary | ICD-10-CM | POA: Diagnosis present

## 2021-05-29 DIAGNOSIS — E119 Type 2 diabetes mellitus without complications: Secondary | ICD-10-CM | POA: Diagnosis not present

## 2021-05-29 DIAGNOSIS — Z91040 Latex allergy status: Secondary | ICD-10-CM | POA: Insufficient documentation

## 2021-05-29 DIAGNOSIS — Z794 Long term (current) use of insulin: Secondary | ICD-10-CM | POA: Insufficient documentation

## 2021-05-29 DIAGNOSIS — I10 Essential (primary) hypertension: Secondary | ICD-10-CM | POA: Insufficient documentation

## 2021-05-29 MED ORDER — TETRACAINE HCL 0.5 % OP SOLN
2.0000 [drp] | Freq: Once | OPHTHALMIC | Status: AC
  Administered 2021-05-29: 2 [drp] via OPHTHALMIC
  Filled 2021-05-29: qty 4

## 2021-05-29 MED ORDER — POLYMYXIN B-TRIMETHOPRIM 10000-0.1 UNIT/ML-% OP SOLN: 1.0000 [drp] | Freq: Four times a day (QID) | OPHTHALMIC | 0 refills | Status: AC

## 2021-05-29 MED ORDER — FLUORESCEIN SODIUM 1 MG OP STRP
1.0000 | ORAL_STRIP | Freq: Once | OPHTHALMIC | Status: AC
  Administered 2021-05-29: 1 via OPHTHALMIC
  Filled 2021-05-29: qty 1

## 2021-05-29 NOTE — ED Notes (Signed)
Pt refused to cooperate with visual acuity screening. Reports the light hurts her eye.

## 2021-05-29 NOTE — ED Triage Notes (Signed)
Patient Evelyn Watts with reports of eye problem starting yesterday. Cloudy vision when room is bright, increased tear production. 50mg  benadryl PO en route, EMS states patient got new dog, after benadryl, swelling and redness decreased. Patient denies pain in triage

## 2021-05-29 NOTE — Discharge Instructions (Addendum)
Please use 1 to 2 drops of the antibiotics in your right eye.  This will likely get better with time.  I think this is likely a viral infection.  There is no evidence of ulcer or scratches to your eye.  It was difficult to assess with some of the equipment and seeing that we are unable to get a visual acuity is difficult to see what your vision is like.  Please follow-up with your eye doctor or primary care doctor.  Please return to the ED for worsening symptoms or vision loss.

## 2021-05-29 NOTE — ED Notes (Signed)
Patient refused visual acuity test. [facility]

## 2021-05-29 NOTE — ED Provider Notes (Signed)
[facility] DEPT Provider Note   CSN: [id number] Arrival date & time: 05/29/21  1520     History Chief Complaint  Patient presents with   Eye Problem    Evelyn Watts is a 46 y.o. female with diabetes and hypertension who presents to the emergency department with right sided eye pain and blurred vision.  This started yesterday.  Patient states she is also been having some nasal congestion.  Patient also states that she got a new dog and has been sneezing.  She reports associated blurred vision but no visual loss.   Eye Problem     Home Medications Prior to Admission medications   Medication Sig Start Date End Date Taking? Authorizing Provider  trimethoprim-polymyxin b (POLYTRIM) ophthalmic solution Place 1 drop into the right eye every 6 (six) hours for 7 days. 05/29/21 06/05/21 Yes Shellene Sweigert M, PA-C  albuterol (VENTOLIN HFA) 108 (90 Base) MCG/ACT inhaler Inhale 2 puffs into the lungs every 4 (four) hours as needed for wheezing. 02/12/20   Loura Halt A, NP  blood glucose meter kit and supplies KIT Dispense based on patient and insurance preference. Use up to four times daily as directed. (FOR ICD-9 250.00, 250.01). 10/10/18   Couture, Cortni S, PA-C  cetirizine (ZYRTEC) 10 MG tablet Take 1 tablet (10 mg total) by mouth daily. 02/12/20   Loura Halt A, NP  cyclobenzaprine (FLEXERIL) 10 MG tablet Take 1 tablet (10 mg total) by mouth 2 (two) times daily as needed for muscle spasms. 10/03/20   Sherrill Raring, PA-C  diclofenac Sodium (VOLTAREN) 1 % GEL Apply 4 g topically 4 (four) times daily. 02/20/20   Landis Martins, DPM  doxycycline (VIBRAMYCIN) 100 MG capsule Take 1 capsule (100 mg total) by mouth 2 (two) times daily. 04/23/21   Rayna Sexton, PA-C  glucose blood test strip Use as instructed. Check blood glucose levels by fingerstick twice per day 11/08/19   Robinson, [place] N, PA-C  ibuprofen (ADVIL) 600 MG tablet Take 1 tablet (600 mg total) by  mouth every 6 (six) hours as needed for moderate pain. 12/13/20   Domenic Moras, PA-C  insulin glargine (LANTUS) 100 UNIT/ML injection Inject 0.3 mLs (30 Units total) into the skin 2 (two) times daily. 08/24/19   Sherwood Gambler, MD  lidocaine (LIDODERM) 5 % Place 1 patch onto the skin daily. Remove & Discard patch within 12 hours or as directed by MD Patient not taking: Reported on 01/28/2021 02/28/20   Gareth Morgan, MD  liraglutide (VICTOZA) 18 MG/3ML SOPN Inject 1.8 mg into the skin daily.    [provider]  liraglutide (VICTOZA) 18 MG/3ML SOPN Inject 1.8 mg into the skin daily. 01/05/20 02/04/20  Carmin Muskrat, MD  lisinopril (PRINIVIL,ZESTRIL) 20 MG tablet Take 1 tablet (20 mg total) by mouth daily. 02/14/18   Gildardo Pounds, NP  metFORMIN (GLUCOPHAGE) 500 MG tablet Take 1,000 mg by mouth 2 (two) times daily with a meal.    [provider]  metFORMIN (GLUCOPHAGE) 500 MG tablet Take 2 tablets (1,000 mg total) by mouth 2 (two) times daily with a meal. 01/05/20 02/04/20  Carmin Muskrat, MD  Misc. Devices MISC Please provide patient with glucerna for nutritional support. 02/14/18   Gildardo Pounds, NP  naproxen (NAPROSYN) 500 MG tablet Take 1 tablet (500 mg total) by mouth 2 (two) times daily. Patient not taking: Reported on 01/28/2021 10/03/20   Sherrill Raring, PA-C  nystatin (MYCOSTATIN/NYSTOP) powder Apply 1 application topically 3 (three) times daily.  Patient not taking: Reported on 01/28/2021 11/08/19   Robinson, [place] N, PA-C  oxyCODONE-acetaminophen (PERCOCET) 5-325 MG tablet Take 1 tablet by mouth every 6 (six) hours as needed for moderate pain or severe pain. 12/13/20   Domenic Moras, PA-C  atorvastatin (LIPITOR) 20 MG tablet Take 1 tablet (20 mg total) by mouth daily. Patient not taking: Reported on 02/09/2019 02/14/18 02/12/20  Gildardo Pounds, NP      Allergies    Latex, Morphine and related, and Tape    Review of Systems   Review of Systems  All other systems  reviewed and are negative.  Physical Exam Updated Vital Signs BP (!) 155/106 (BP Location: Left Arm)    Pulse 94    Temp 98.4 F (36.9 C) (Oral)    Resp 18    Ht _0  (1.803 m)    SpO2 96%    BMI 36.26 kg/m  Physical Exam Vitals and nursing note reviewed.  Constitutional:      Appearance: Normal appearance.  HENT:     Head: Normocephalic and atraumatic.  Eyes:     General:        Right eye: Discharge present. No foreign body.        Left eye: No discharge.     Conjunctiva/sclera: Conjunctivae normal.     Comments: No evidence of pinguecula or pterygium.  No evidence of purulent drainage.  There is mild right-sided conjunctival injection.  Extraocular movements are intact without any evidence of entrapment.  Pulmonary:     Effort: Pulmonary effort is normal.  Skin:    General: Skin is warm and dry.     Findings: No rash.  Neurological:     General: No focal deficit present.     Mental Status: She is alert.  Psychiatric:        Mood and Affect: Mood normal.        Behavior: Behavior normal.    ED Results / Procedures / Treatments   Labs (all labs ordered are listed, but only abnormal results are displayed) Labs Reviewed - No data to display  EKG None  Radiology No results found.  Procedures Procedures    Medications Ordered in ED Medications  tetracaine (PONTOCAINE) 0.5 % ophthalmic solution 2 drop (2 drops Right Eye Given by Other 05/29/21 1713)  fluorescein ophthalmic strip 1 strip (1 strip Right Eye Given 05/29/21 1714)    ED Course/ Medical Decision Making/ A&P                           Medical Decision Making Risk Prescription drug management.   Evelyn Watts is a 46 y.o. female who presents to the emergency department for further evaluation of right-sided eye pain.  Patient does not have any significant visual loss although she is complaining of blurred vision.  I have a low suspicion at this time for emergent causes of her eye pain.  I put a request  in to do visual acuity prior to me doing physical exam of the patient but the patient declined numerous times.  This does not give me an accurate depiction of her vision and thus cannot rule out other emergent causes.  In addition, there were no Tono-Pen covers and had to use a Tegaderm cover to attempt to get ocular pressures.  After 1 attempt I got an error screen and the patient denied any further testing on the right eye.  Her pain was improved after tetracaine  drops which is reassuring for any structural abnormalities.  Did not see any evidence of corneal abrasion or ulcers on UV light.  I will likely treat her with antibiotic drops to cover her for both bacterial conjunctivitis given that she is having tearing but I do think this is likely viral concern that is clear tearing.  I will have her follow-up with her eye doctor.  Ultimately she is safe in the outpatient setting.  She is safe for discharge.   Final Clinical Impression(s) / ED Diagnoses Final diagnoses:  Eye pain, right    Rx / DC Orders ED Discharge Orders          Ordered    trimethoprim-polymyxin b (POLYTRIM) ophthalmic solution  Every 6 hours        05/29/21 1727              Myna Bright [place], Hershal Coria 05/29/21 1729    Lacretia Leigh, MD 05/30/21 1410

## 2021-08-06 ENCOUNTER — Encounter (HOSPITAL_COMMUNITY): Payer: Self-pay | Admitting: Pharmacy Technician

## 2021-08-06 ENCOUNTER — Emergency Department (HOSPITAL_COMMUNITY)
Admission: EM | Admit: 2021-08-06 | Discharge: 2021-08-06 | Disposition: A | Discharge status: Home / Self Care | Payer: Medicare HMO | Attending: Student | Admitting: Student

## 2021-08-06 DIAGNOSIS — Z5321 Procedure and treatment not carried out due to patient leaving prior to being seen by health care provider: Secondary | ICD-10-CM | POA: Diagnosis not present

## 2021-08-06 DIAGNOSIS — R102 Pelvic and perineal pain: Secondary | ICD-10-CM | POA: Insufficient documentation

## 2021-08-06 DIAGNOSIS — M25571 Pain in right ankle and joints of right foot: Secondary | ICD-10-CM | POA: Insufficient documentation

## 2021-08-06 DIAGNOSIS — E119 Type 2 diabetes mellitus without complications: Secondary | ICD-10-CM | POA: Insufficient documentation

## 2021-08-06 DIAGNOSIS — M25572 Pain in left ankle and joints of left foot: Secondary | ICD-10-CM | POA: Diagnosis present

## 2021-08-06 MED ORDER — IBUPROFEN 600 MG PO TABS: 600.0000 mg | ORAL_TABLET | Freq: Four times a day (QID) | ORAL | 0 refills | Status: DC | PRN

## 2021-08-06 NOTE — ED Provider Triage Note (Signed)
Emergency Medicine Provider Triage Evaluation Note ? ?Evelyn Watts , a 46 y.o. female  was evaluated in triage.  Pt complains of bilateral foot discomfort.  Reports that she has "hammertoe and bone spurs," that caused her to have bilateral foot pain.  Also reports she has a PCP that is a travel provider that comes into her home and she would like a new one because they have not checked her A1c in a while.  Has a history of diabetic neuropathy for which she takes gabapentin. ? ?Also concerned for vaginal infection due to itching and inflammation.  Says she contacted her PCP who would not send her Diflucan without evaluation. ? ?Review of Systems  ?Positive: Vaginal itching and bilateral foot pain ?Negative:  ? ?Physical Exam  ?BP (!) 125/91 (BP Location: Left Arm)   Pulse (!) 101   Temp 97.8 ?F (36.6 ?C) (Oral)   Resp 19   SpO2 98%  ?Gen:   Awake, no distress   ?Resp:  Normal effort  ?MSK:   Moves extremities without difficulty  ?Other:  Bilateral feet without signs of infection.  No obvious abnormalities, strong DP pulse ? ?Medical Decision Making  ?Medically screening exam initiated at 11:16 AM.  Appropriate orders placed.  Evelyn Watts was informed that the remainder of the evaluation will be completed by another provider, this initial triage assessment does not replace that evaluation, and the importance of remaining in the ED until their evaluation is complete. ? ? ? ?Needs pelvic, otherwise with DC ?  ?Saddie Benders, PA-C ?08/06/21 1118 ? ?

## 2021-08-06 NOTE — ED Notes (Signed)
Pt called for room transfer, no response.  ?

## 2021-08-06 NOTE — ED Notes (Signed)
Pt called x2, no response.  ?

## 2021-08-06 NOTE — ED Triage Notes (Signed)
Pt here with ongoing pain to bil feet, worse when walking. Pt also states she slept in a body suit last night and now her vagina is sore.  ?

## 2021-08-08 ENCOUNTER — Emergency Department (HOSPITAL_COMMUNITY)
Admission: EM | Admit: 2021-08-08 | Discharge: 2021-08-09 | Discharge status: Against Medical Advice | Payer: Medicare HMO | Attending: Physician Assistant | Admitting: Physician Assistant

## 2021-08-08 ENCOUNTER — Other Ambulatory Visit: Payer: Self-pay

## 2021-08-08 DIAGNOSIS — Z5321 Procedure and treatment not carried out due to patient leaving prior to being seen by health care provider: Secondary | ICD-10-CM | POA: Insufficient documentation

## 2021-08-08 DIAGNOSIS — M79672 Pain in left foot: Secondary | ICD-10-CM | POA: Insufficient documentation

## 2021-08-08 DIAGNOSIS — R1030 Lower abdominal pain, unspecified: Secondary | ICD-10-CM | POA: Insufficient documentation

## 2021-08-08 DIAGNOSIS — N898 Other specified noninflammatory disorders of vagina: Secondary | ICD-10-CM | POA: Insufficient documentation

## 2021-08-08 DIAGNOSIS — R300 Dysuria: Secondary | ICD-10-CM | POA: Diagnosis not present

## 2021-08-08 DIAGNOSIS — M79671 Pain in right foot: Secondary | ICD-10-CM | POA: Insufficient documentation

## 2021-08-08 LAB — URINALYSIS, ROUTINE W REFLEX MICROSCOPIC
Bacteria, UA: NONE SEEN
Bilirubin Urine: NEGATIVE
Glucose, UA: >=500 mg/dL — AB
Hgb urine dipstick: NEGATIVE
Ketones, ur: NEGATIVE mg/dL
Leukocytes,Ua: NEGATIVE
Nitrite: NEGATIVE
Protein, ur: NEGATIVE mg/dL
Specific Gravity, Urine: 1.029 (ref 1.005–1.030)
pH: 5 (ref 5.0–8.0)

## 2021-08-08 LAB — PREGNANCY, URINE: Preg Test, Ur: NEGATIVE

## 2021-08-08 NOTE — ED Notes (Signed)
Patient was called to recheck vital signs but no response. x1 

## 2021-08-08 NOTE — ED Provider Triage Note (Signed)
Emergency Medicine Provider Triage Evaluation Note ? ?Evelyn Watts , a 46 y.o. female  was evaluated in triage.  Pt complains of vaginal discharge  ? ?Review of Systems  ?Positive: Lower abdominal pain ?Negative: No uti symptoms ? ?Physical Exam  ?BP (!) 157/101 (BP Location: Right Arm)   Pulse 97   Temp 97.9 ?F (36.6 ?C) (Oral)   Resp 18   SpO2 100%  ?Gen:   Awake, no distress   ?Resp:  Normal effort  ?MSK:   Moves extremities without difficulty  ?Other:   ? ?Medical Decision Making  ?Medically screening exam initiated at 4:32 PM.  Appropriate orders placed.  Evelyn Watts was informed that the remainder of the evaluation will be completed by another provider, this initial triage assessment does not replace that evaluation, and the importance of remaining in the ED until their evaluation is complete. ? ? ?  ?Evelyn Watts, [place] ?08/08/21 1633 ? ?

## 2021-08-08 NOTE — ED Triage Notes (Signed)
Patient bib [facility], patient c/o vaginal pain and bil foot pain x 3 months. Patient has hammertoes and chronic feet issues.  Pain 10/10. Per EMS, patient says she was hoping to get her hammertoe surgery taken care of today  ?

## 2021-08-09 ENCOUNTER — Ambulatory Visit (INDEPENDENT_AMBULATORY_CARE_PROVIDER_SITE_OTHER)
Admission: EM | Admit: 2021-08-09 | Discharge: 2021-08-09 | Disposition: A | Discharge status: Home / Self Care | Payer: Medicare HMO | Source: Home / Self Care

## 2021-08-09 DIAGNOSIS — N898 Other specified noninflammatory disorders of vagina: Secondary | ICD-10-CM

## 2021-08-09 DIAGNOSIS — M79671 Pain in right foot: Secondary | ICD-10-CM | POA: Insufficient documentation

## 2021-08-09 DIAGNOSIS — M79672 Pain in left foot: Secondary | ICD-10-CM

## 2021-08-09 DIAGNOSIS — R300 Dysuria: Secondary | ICD-10-CM

## 2021-08-09 LAB — POCT URINALYSIS DIPSTICK, ED / UC
Bilirubin Urine: NEGATIVE
Glucose, UA: >=1000 mg/dL — AB
Ketones, ur: NEGATIVE mg/dL
Nitrite: NEGATIVE
Protein, ur: NEGATIVE mg/dL
Specific Gravity, Urine: 1.01 (ref 1.005–1.030)
Urobilinogen, UA: 0.2 mg/dL (ref 0.0–1.0)
pH: 5.5 (ref 5.0–8.0)

## 2021-08-09 LAB — POC URINE PREG, ED: Preg Test, Ur: NEGATIVE

## 2021-08-09 NOTE — ED Provider Notes (Signed)
?[facility] ? ? ? ?CSN: [id number] ?Arrival date & time: 08/09/21  6283 ? ? ?  ? ?History   ?Chief Complaint ?Chief Complaint  ?Patient presents with  ? Foot Pain  ? ? ?HPI ?Evelyn Watts is a 46 y.o. female.  ? ?Patient presents with foot pain has been going on for months.  She reports she was supposed to have an operation on her feet at the end of last year, however this did not happen because her A1c was elevated.  She reports she has been trying to get in contact with her primary care provider, however she has been unable to for the past several months.  She reports her feet are painful and swollen, however she denies any weakness with weightbearing, morning stiffness, redness, bruising, numbness or tingling in her toes, and fevers. ? ?She also reports burning with urination this been going on and off the past few months along with white, thick, vaginal discharge.  She also reports vaginal itching.  She denies urinary frequency, urgency, voiding smaller amounts, change in the odor of her urine, hematuria, abdominal pain, new back pain, nausea/vomiting, and fevers.  She also denies any rashes, open sores, swelling in her groin, lesions on her genitalia.  She is not taking anything for her vaginal/urinary symptoms, however does report a history of frequent yeast infections and has requested the " pill that starts with an F" her primary care provider.  Reports last menstrual period ended earlier this month.  She is married with 1 female sexual partner. ? ?She is requesting to establish care with a new primary care provider.   ? ? ? ?Past Medical History:  ?Diagnosis Date  ? Allergies   ? Asthma   ? Diabetes mellitus without complication ([place])   ? ? ?Patient Active Problem List  ? Diagnosis Date Noted  ? Essential hypertension 01/20/2020  ? COVID-19 virus infection 01/20/2020  ? Type 2 diabetes mellitus with hyperglycemia, with long-term current use of insulin ([place]) 02/14/2018  ? Diabetes mellitus without  complication ([place]) 15/17/6160  ? Moderate episode of recurrent major depressive disorder ([place]) 05/24/2016  ? Morbid obesity ([place]) 05/24/2016  ? ? ?Past Surgical History:  ?Procedure Laterality Date  ? CESAREAN SECTION    ? 4  ? CHOLECYSTECTOMY    ? HERNIA REPAIR    ? TONSILLECTOMY    ? ? ?OB History   ?No obstetric history on file. ?  ? ? ? ?Home Medications   ? ?Prior to Admission medications   ?Medication Sig Start Date End Date Taking? Authorizing Provider  ?albuterol (VENTOLIN HFA) 108 (90 Base) MCG/ACT inhaler Inhale 2 puffs into the lungs every 4 (four) hours as needed for wheezing. 02/12/20   Loura Halt A, NP  ?blood glucose meter kit and supplies KIT Dispense based on patient and insurance preference. Use up to four times daily as directed. (FOR ICD-9 250.00, 250.01). 10/10/18   Couture, Cortni S, PA-C  ?cetirizine (ZYRTEC) 10 MG tablet Take 1 tablet (10 mg total) by mouth daily. 02/12/20   Loura Halt A, NP  ?cyclobenzaprine (FLEXERIL) 10 MG tablet Take 1 tablet (10 mg total) by mouth 2 (two) times daily as needed for muscle spasms. 10/03/20   Sherrill Raring, PA-C  ?diclofenac Sodium (VOLTAREN) 1 % GEL Apply 4 g topically 4 (four) times daily. 02/20/20   Landis Martins, DPM  ?doxycycline (VIBRAMYCIN) 100 MG capsule Take 1 capsule (100 mg total) by mouth 2 (two) times daily. 04/23/21   Joldersma,  Logan, PA-C  ?glucose blood test strip Use as instructed. Check blood glucose levels by fingerstick twice per day 11/08/19   Robinson, [place] N, PA-C  ?ibuprofen (ADVIL) 600 MG tablet Take 1 tablet (600 mg total) by mouth every 6 (six) hours as needed for moderate pain. 08/06/21   Redwine, Madison A, PA-C  ?insulin glargine (LANTUS) 100 UNIT/ML injection Inject 0.3 mLs (30 Units total) into the skin 2 (two) times daily. 08/24/19   Sherwood Gambler, MD  ?lidocaine (LIDODERM) 5 % Place 1 patch onto the skin daily. Remove & Discard patch within 12 hours or as directed by MD ?Patient not taking: Reported on 01/28/2021 02/28/20    Gareth Morgan, MD  ?liraglutide (VICTOZA) 18 MG/3ML SOPN Inject 1.8 mg into the skin daily.    [provider]  ?liraglutide (VICTOZA) 18 MG/3ML SOPN Inject 1.8 mg into the skin daily. 01/05/20 02/04/20  Carmin Muskrat, MD  ?lisinopril (PRINIVIL,ZESTRIL) 20 MG tablet Take 1 tablet (20 mg total) by mouth daily. 02/14/18   Gildardo Pounds, NP  ?metFORMIN (GLUCOPHAGE) 500 MG tablet Take 1,000 mg by mouth 2 (two) times daily with a meal.    [provider]  ?metFORMIN (GLUCOPHAGE) 500 MG tablet Take 2 tablets (1,000 mg total) by mouth 2 (two) times daily with a meal. 01/05/20 02/04/20  Carmin Muskrat, MD  ?[place]. Devices MISC Please provide patient with glucerna for nutritional support. 02/14/18   Gildardo Pounds, NP  ?naproxen (NAPROSYN) 500 MG tablet Take 1 tablet (500 mg total) by mouth 2 (two) times daily. ?Patient not taking: Reported on 01/28/2021 10/03/20   Sherrill Raring, PA-C  ?nystatin (MYCOSTATIN/NYSTOP) powder Apply 1 application topically 3 (three) times daily. ?Patient not taking: Reported on 01/28/2021 11/08/19   Robinson, [place] N, PA-C  ?oxyCODONE-acetaminophen (PERCOCET) 5-325 MG tablet Take 1 tablet by mouth every 6 (six) hours as needed for moderate pain or severe pain. 12/13/20   Domenic Moras, PA-C  ?atorvastatin (LIPITOR) 20 MG tablet Take 1 tablet (20 mg total) by mouth daily. ?Patient not taking: Reported on 02/09/2019 02/14/18 02/12/20  Gildardo Pounds, NP  ? ? ?Family History ?Family History  ?Problem Relation Age of Onset  ? Healthy Mother   ? Healthy Father   ? Hypertension Neg Hx   ? Diabetes Neg Hx   ? ? ?Social History ?Social History  ? ?Tobacco Use  ? Smoking status: Never  ? Smokeless tobacco: Never  ?Vaping Use  ? Vaping Use: Never used  ?Substance Use Topics  ? Alcohol use: No  ? Drug use: No  ? ? ? ?Allergies   ?Latex, Morphine and related, and Tape ? ? ?Review of Systems ?Review of Systems ?Per HPI ? ?Physical Exam ?Triage Vital Signs ?ED Triage Vitals [08/09/21  0832]  ?Enc Vitals Group  ?   BP 132/84  ?   Pulse   ?   Resp 16  ?   Temp   ?   Temp src   ?   SpO2 100 %  ?   Weight   ?   Height   ?   Head Circumference   ?   Peak Flow   ?   Pain Score   ?   Pain Loc   ?   Pain Edu?   ?   Excl. in [place]?   ? ?No data found. ? ?Updated Vital Signs ?BP 132/84 (BP Location: Left Arm)   Resp 16   SpO2 100%  ? ?Visual Acuity ?Right  Eye Distance:   ?Left Eye Distance:   ?Bilateral Distance:   ? ?Right Eye Near:   ?Left Eye Near:    ?Bilateral Near:    ? ?Physical Exam ?Vitals and nursing note reviewed.  ?Constitutional:   ?   General: She is not in acute distress. ?   Appearance: Normal appearance. She is obese. She is not toxic-appearing.  ?Pulmonary:  ?   Effort: Pulmonary effort is normal. No respiratory distress.  ?Abdominal:  ?   General: Abdomen is flat. Bowel sounds are normal. There is no distension.  ?   Palpations: Abdomen is soft. There is no mass.  ?   Tenderness: There is no abdominal tenderness. There is no right CVA tenderness or left CVA tenderness.  ?Genitourinary: ?   Comments: deferred ?Musculoskeletal:  ?   Right foot: Normal. Normal range of motion and normal capillary refill. No bony tenderness. Normal pulse.  ?   Left foot: Normal. Normal range of motion and normal capillary refill. No bony tenderness. Normal pulse.  ?Feet:  ?   Right foot:  ?   Skin integrity: Skin integrity normal.  ?   Left foot:  ?   Skin integrity: Skin integrity normal.  ?   Comments: No obvious deformity, swelling, or erythema to bilateral feet ?Skin: ?   General: Skin is warm and dry.  ?   Capillary Refill: Capillary refill takes less than 2 seconds.  ?   Coloration: Skin is not jaundiced or pale.  ?   Findings: No erythema.  ?Neurological:  ?   Mental Status: She is alert and oriented to person, place, and time.  ?   Motor: No weakness.  ?   Gait: Gait normal.  ?Psychiatric:     ?   Behavior: Behavior is cooperative.  ? ? ? ?UC Treatments / Results  ?Labs ?(all labs ordered are listed,  but only abnormal results are displayed) ?Labs Reviewed  ?POCT URINALYSIS DIPSTICK, ED / UC - Abnormal; Notable for the following components:  ?    Result Value  ? Glucose, UA >=1000 (*)   ? Hgb urine dips

## 2021-08-09 NOTE — ED Triage Notes (Signed)
C/o bilateral foot pain for several months.  ?She reports having hammertoes and would like a referral.  ?Pt has vaginal discharge and dysuria. She would like to be check for yeast and UTI. ?

## 2021-08-09 NOTE — Discharge Instructions (Signed)
-   The urine test today is not specific enough to show if there is a urinary tract infection; we are sending your urine off for culture I will let you know if it comes back positive meaning antibiotics are needed ?-We will also let you know if anything comes up positive on the self swab ?-Please keep your appointment later this week to establish care with a new primary care provider.  If your foot pain worsens in the meantime, feel free to contact the [facility] as below ?

## 2021-08-10 ENCOUNTER — Telehealth (HOSPITAL_COMMUNITY): Payer: Self-pay | Admitting: Emergency Medicine

## 2021-08-10 LAB — CERVICOVAGINAL ANCILLARY ONLY
Bacterial Vaginitis (gardnerella): NEGATIVE
Candida Glabrata: POSITIVE — AB
Candida Vaginitis: POSITIVE — AB
Chlamydia: NEGATIVE
Comment: NEGATIVE
Comment: NEGATIVE
Comment: NEGATIVE
Comment: NEGATIVE
Comment: NEGATIVE
Comment: NORMAL
Neisseria Gonorrhea: NEGATIVE
Trichomonas: NEGATIVE

## 2021-08-10 MED ORDER — FLUCONAZOLE 150 MG PO TABS: 150.0000 mg | ORAL_TABLET | Freq: Once | ORAL | 0 refills | Status: AC

## 2021-08-11 LAB — URINE CULTURE: Culture: 10000 — AB

## 2021-08-13 ENCOUNTER — Ambulatory Visit: Payer: Medicare HMO | Admitting: Nurse Practitioner

## 2021-08-25 ENCOUNTER — Ambulatory Visit: Payer: Medicare HMO | Admitting: Nurse Practitioner

## 2021-12-15 ENCOUNTER — Encounter (HOSPITAL_COMMUNITY): Payer: Self-pay

## 2021-12-15 ENCOUNTER — Ambulatory Visit (HOSPITAL_COMMUNITY)
Admission: EM | Admit: 2021-12-15 | Discharge: 2021-12-15 | Disposition: A | Discharge status: Home / Self Care | Payer: Medicare HMO | Attending: Sports Medicine | Admitting: Sports Medicine

## 2021-12-15 ENCOUNTER — Telehealth (HOSPITAL_COMMUNITY): Payer: Self-pay

## 2021-12-15 DIAGNOSIS — E1169 Type 2 diabetes mellitus with other specified complication: Secondary | ICD-10-CM | POA: Diagnosis not present

## 2021-12-15 DIAGNOSIS — B3731 Acute candidiasis of vulva and vagina: Secondary | ICD-10-CM

## 2021-12-15 DIAGNOSIS — E1165 Type 2 diabetes mellitus with hyperglycemia: Secondary | ICD-10-CM

## 2021-12-15 DIAGNOSIS — Z760 Encounter for issue of repeat prescription: Secondary | ICD-10-CM

## 2021-12-15 DIAGNOSIS — Z794 Long term (current) use of insulin: Secondary | ICD-10-CM

## 2021-12-15 MED ORDER — FLUCONAZOLE 150 MG PO TABS: 150.0000 mg | ORAL_TABLET | Freq: Once | ORAL | 0 refills | Status: AC

## 2021-12-15 MED ORDER — INSULIN GLARGINE 100 UNIT/ML ~~LOC~~ SOLN: 30.0000 [IU] | Freq: Two times a day (BID) | SUBCUTANEOUS | 3 refills | Status: DC

## 2021-12-15 NOTE — ED Triage Notes (Signed)
Pt states out of her victoza, lantus, and trulicity for months and now having vaginal pain.

## 2021-12-15 NOTE — Discharge Instructions (Addendum)
Take diflucan for your yeast infection I sent 1 week of Lantus to your pharmacy See your primary doctor for more medication refills

## 2021-12-15 NOTE — ED Provider Notes (Signed)
[facility]   [id number] 12/15/21 Arrival Time: 0808  ASSESSMENT & PLAN:  1. Vaginal candidiasis   2. Type 2 diabetes mellitus with hyperglycemia, with long-term current use of insulin (HCC) Chronic  3. Medication refill    -History consistent with vaginal candidiasis.  Will treat empirically with Diflucan.  Swab collected today in the office to confirm the diagnosis.  Advised that yeast infections will get better with better glycemic control.  -Provided 1 week refill of Lantus 30 units twice daily.  Advised to see primary/upcoming appointment with internal medicine physician for regular refills of diabetic medications.  Meds ordered this encounter  Medications   fluconazole (DIFLUCAN) 150 MG tablet    Sig: Take 1 tablet (150 mg total) by mouth once for 1 dose. If no improvement after 72 hrs, take second pill    Dispense:  2 tablet    Refill:  0   insulin glargine (LANTUS) 100 UNIT/ML injection    Sig: Inject 0.3 mLs (30 Units total) into the skin 2 (two) times daily.    Dispense:  10 mL    Refill:  3     Discharge Instructions      Take diflucan for your yeast infection I sent 1 week of Lantus to your pharmacy See your primary doctor for more medication refills      Follow-up Information     Hatchett, Corrie Dandy, NP In 1 week.   Specialty: Nurse Practitioner Contact information: [address] [address] [place] [address] [phone]                  Reviewed expectations re: course of current medical issues. Questions answered. Outlined signs and symptoms indicating need for more acute intervention. Patient verbalized understanding. After Visit Summary given.   SUBJECTIVE: Patient presents to urgent care for evaluation of vaginal itching and pain.  She is a type II diabetic and has been out of her Victoza, Trulicity, and Lantus for 4 months.  She has had difficulty getting refills from her primary.  She says over the last 4 months she has had vaginal  itching.  She previously has a history of yeast infections.  She says Diflucan has made it better in the past.  She denies any discharge today but does report symptoms that feels similar to her last yeast infection.  No concern for STD exposure.  She would like treatment for yeast infection and a refill on her Lantus.  She usually takes Lantus 30 units twice daily.  She does have an upcoming appointment with an internal medicine physician next week which she says will prescribe her more diabetic medications.  No LMP recorded. (Menstrual status: Irregular Periods). Past Surgical History:  Procedure Laterality Date   CESAREAN SECTION     4   CHOLECYSTECTOMY     HERNIA REPAIR     TONSILLECTOMY       OBJECTIVE:  Vitals:   12/15/21 0835  BP: (!) 122/90  Pulse: 96  Resp: 18  Temp: 98.1 F (36.7 C)  TempSrc: Oral  SpO2: 98%     Physical Exam Vitals reviewed.  Constitutional:      Appearance: She is obese.  Cardiovascular:     Rate and Rhythm: Normal rate.  Pulmonary:     Effort: Pulmonary effort is normal.  Abdominal:     Palpations: Abdomen is soft.     Tenderness: There is no abdominal tenderness. There is no guarding.  Musculoskeletal:        General: Normal  range of motion.     Cervical back: Normal range of motion.  Skin:    General: Skin is warm.  Neurological:     General: No focal deficit present.     Mental Status: She is alert.      Labs: Results for orders placed or performed during the hospital encounter of 08/09/21  Urine Culture   Specimen: Urine, Clean Catch  Result Value Ref Range   Specimen Description URINE, CLEAN CATCH    Special Requests NONE    Culture (A)     10,000 COLONIES/mL STREPTOCOCCUS AGALACTIAE TESTING AGAINST S. AGALACTIAE NOT ROUTINELY PERFORMED DUE TO PREDICTABILITY OF AMP/PEN/VAN SUSCEPTIBILITY. Performed at [facility], [address] [place], [place] [address]    Report Status 08/11/2021 FINAL   POC Urinalysis  dipstick  Result Value Ref Range   Glucose, UA >=1000 (A) NEGATIVE mg/dL   Bilirubin Urine NEGATIVE NEGATIVE   Ketones, ur NEGATIVE NEGATIVE mg/dL   Specific Gravity, Urine 1.010 1.005 - 1.030   Hgb urine dipstick TRACE (A) NEGATIVE   pH 5.5 5.0 - 8.0   Protein, ur NEGATIVE NEGATIVE mg/dL   Urobilinogen, UA 0.2 0.0 - 1.0 mg/dL   Nitrite NEGATIVE NEGATIVE   Leukocytes,Ua TRACE (A) NEGATIVE  POC urine pregnancy  Result Value Ref Range   Preg Test, Ur NEGATIVE NEGATIVE  Cervicovaginal ancillary only  Result Value Ref Range   Neisseria Gonorrhea Negative    Chlamydia Negative    Trichomonas Negative    Bacterial Vaginitis (gardnerella) Negative    Candida Vaginitis Positive (A)    Candida Glabrata Positive (A)    Comment      Normal Reference Range Bacterial Vaginosis - Negative   Comment Normal Reference Range Candida Species - Negative    Comment Normal Reference Range Candida Galbrata - Negative    Comment Normal Reference Range Trichomonas - Negative    Comment Normal Reference Ranger Chlamydia - Negative    Comment      Normal Reference Range Neisseria Gonorrhea - Negative   Labs Reviewed  CERVICOVAGINAL ANCILLARY ONLY    Imaging: No results found.   Allergies  Allergen Reactions   Latex Rash   Morphine And Related Rash   Tape Rash                                               Past Medical History:  Diagnosis Date   Allergies    Asthma    Diabetes mellitus without complication (HCC)     Social History   Socioeconomic History   Marital status: Married    Spouse name: Not on file   Number of children: Not on file   Years of education: Not on file   Highest education level: Not on file  Occupational History   Not on file  Tobacco Use   Smoking status: Never   Smokeless tobacco: Never  Vaping Use   Vaping Use: Never used  Substance and Sexual Activity   Alcohol use: No   Drug use: No   Sexual activity: Not Currently  Other Topics Concern   Not on  file  Social History Narrative   Not on file   Social Determinants of Health   Financial Resource Strain: Not on file  Food Insecurity: Not on file  Transportation Needs: Not on file  Physical Activity: Not on  file  Stress: Not on file  Social Connections: Not on file  Intimate Partner Violence: Not on file    Family History  Problem Relation Age of Onset   Healthy Mother    Healthy Father    Hypertension Neg Hx    Diabetes Neg Hx       Jourdin Connors, Dorian Pod, MD 12/15/21 [phone]

## 2021-12-16 LAB — CERVICOVAGINAL ANCILLARY ONLY
Bacterial Vaginitis (gardnerella): NEGATIVE
Candida Glabrata: POSITIVE — AB
Candida Vaginitis: POSITIVE — AB
Comment: NEGATIVE
Comment: NEGATIVE
Comment: NEGATIVE

## 2021-12-23 ENCOUNTER — Ambulatory Visit: Payer: Medicare HMO | Admitting: Internal Medicine

## 2022-02-02 ENCOUNTER — Emergency Department (HOSPITAL_COMMUNITY)
Admission: EM | Admit: 2022-02-02 | Discharge: 2022-02-03 | Disposition: A | Discharge status: Home / Self Care | Payer: Medicare HMO | Attending: Emergency Medicine | Admitting: Emergency Medicine

## 2022-02-02 ENCOUNTER — Other Ambulatory Visit: Payer: Self-pay

## 2022-02-02 ENCOUNTER — Encounter (HOSPITAL_COMMUNITY): Payer: Self-pay | Admitting: Emergency Medicine

## 2022-02-02 DIAGNOSIS — F419 Anxiety disorder, unspecified: Secondary | ICD-10-CM | POA: Diagnosis present

## 2022-02-02 DIAGNOSIS — F410 Panic disorder [episodic paroxysmal anxiety] without agoraphobia: Secondary | ICD-10-CM | POA: Insufficient documentation

## 2022-02-02 NOTE — ED Triage Notes (Signed)
Pt BIB EMS from a restaurant for anxiety attack, pt having family stressors currently. Pt also c/o intermittent nausea with RLQ abdominal pain and bilateral lower extremity swelling.

## 2022-02-03 NOTE — ED Provider Notes (Signed)
  [facility] Emergency Department Provider Note MRN:  [id number]  Arrival date & time: 02/03/22     Chief Complaint   Anxiety   History of Present Illness   Evelyn Watts is a 46 y.o. year-old female presents to the ED with chief complaint of anxiety attack.  She states that she was dealing with some life and family stressors earlier today with her children and it caused her to have a panic attack.  She states that since she has been here she feels much more calm and relaxed.  She states that she thinks the panic attack caused her to feel nauseated, but this is also subsided.  She states that she feels ready to go home.  History provided by patient.   Review of Systems  Pertinent positive and negative review of systems noted in HPI.    Physical Exam   Vitals:   02/02/22 2230  BP: (!) 141/93  Pulse: (!) 102  Resp: 18  Temp: 98.6 F (37 C)  SpO2: 95%    CONSTITUTIONAL:  well-appearing, NAD NEURO:  Alert and oriented x 3, CN 3-12 grossly intact EYES:  eyes equal and reactive ENT/NECK:  Supple, no stridor  CARDIO:  normal rate, appears well-perfused  PULM:  No respiratory distress,  GI/GU:  non-distended,  MSK/SPINE:  No gross deformities, no edema, moves all extremities  SKIN:  no rash, atraumatic   *Additional and/or pertinent findings included in MDM below  Diagnostic and Interventional Summary     Labs Reviewed - No data to display  No orders to display    Medications - No data to display   Procedures  /  Critical Care Procedures  ED Course and Medical Decision Making  I have reviewed the triage vital signs, the nursing notes, and pertinent available records from the EMR.  Social Determinants Affecting Complexity of Care: Patient has no clinically significant social determinants affecting this chief complaint..   ED Course:    Medical Decision Making Patient here for panic attack.  States that she had some life stressors at  home that made her very anxious, but she is feeling better now.  I don't think she needs further workup.  Will give resource guide.  Problems Addressed: Panic attack: acute illness or injury     Consultants: No consultations were needed in caring for this patient.   Treatment and Plan: Emergency department workup does not suggest an emergent condition requiring admission or immediate intervention beyond  what has been performed at this time. The patient is safe for discharge and has  been instructed to return immediately for worsening symptoms, change in  symptoms or any other concerns    Final Clinical Impressions(s) / ED Diagnoses     ICD-10-CM   1. Panic attack  F41.0       ED Discharge Orders     None         Discharge Instructions Discussed with and Provided to Patient:   Discharge Instructions   None      Montine Circle, PA-C 02/03/22 2409    Merryl Hacker, MD 02/03/22 [phone]

## 2022-05-25 ENCOUNTER — Other Ambulatory Visit: Payer: Self-pay | Admitting: Sports Medicine

## 2022-06-06 ENCOUNTER — Ambulatory Visit: Payer: Medicare HMO | Admitting: Internal Medicine

## 2022-07-15 ENCOUNTER — Other Ambulatory Visit: Payer: Self-pay

## 2022-07-15 ENCOUNTER — Emergency Department (HOSPITAL_COMMUNITY): Payer: Medicare HMO

## 2022-07-15 ENCOUNTER — Emergency Department (HOSPITAL_COMMUNITY)
Admission: EM | Admit: 2022-07-15 | Discharge: 2022-07-15 | Disposition: A | Discharge status: Home / Self Care | Payer: Medicare HMO | Attending: Emergency Medicine | Admitting: Emergency Medicine

## 2022-07-15 ENCOUNTER — Encounter (HOSPITAL_COMMUNITY): Payer: Self-pay

## 2022-07-15 DIAGNOSIS — Z91040 Latex allergy status: Secondary | ICD-10-CM | POA: Diagnosis not present

## 2022-07-15 DIAGNOSIS — Z794 Long term (current) use of insulin: Secondary | ICD-10-CM

## 2022-07-15 DIAGNOSIS — R000 Tachycardia, unspecified: Secondary | ICD-10-CM | POA: Insufficient documentation

## 2022-07-15 DIAGNOSIS — I10 Essential (primary) hypertension: Secondary | ICD-10-CM | POA: Diagnosis not present

## 2022-07-15 DIAGNOSIS — L02211 Cutaneous abscess of abdominal wall: Secondary | ICD-10-CM | POA: Insufficient documentation

## 2022-07-15 DIAGNOSIS — E1165 Type 2 diabetes mellitus with hyperglycemia: Secondary | ICD-10-CM | POA: Insufficient documentation

## 2022-07-15 DIAGNOSIS — J45909 Unspecified asthma, uncomplicated: Secondary | ICD-10-CM | POA: Insufficient documentation

## 2022-07-15 DIAGNOSIS — R109 Unspecified abdominal pain: Secondary | ICD-10-CM | POA: Diagnosis present

## 2022-07-15 HISTORY — DX: Essential (primary) hypertension: I10

## 2022-07-15 LAB — COMPREHENSIVE METABOLIC PANEL
ALT: 13 U/L (ref 0–44)
AST: 14 U/L — ABNORMAL LOW (ref 15–41)
Albumin: 3.3 g/dL — ABNORMAL LOW (ref 3.5–5.0)
Alkaline Phosphatase: 93 U/L (ref 38–126)
Anion gap: 9 (ref 5–15)
BUN: 19 mg/dL (ref 6–20)
CO2: 24 mmol/L (ref 22–32)
Calcium: 8.8 mg/dL — ABNORMAL LOW (ref 8.9–10.3)
Chloride: 95 mmol/L — ABNORMAL LOW (ref 98–111)
Creatinine, Ser: 0.85 mg/dL (ref 0.44–1.00)
GFR, Estimated: >60 mL/min (ref >60)
Glucose, Bld: 382 mg/dL — ABNORMAL HIGH (ref 70–99)
Potassium: 3.9 mmol/L (ref 3.5–5.1)
Sodium: 128 mmol/L — ABNORMAL LOW (ref 135–145)
Total Bilirubin: 0.4 mg/dL (ref 0.3–1.2)
Total Protein: 7.1 g/dL (ref 6.5–8.1)

## 2022-07-15 LAB — URINALYSIS, ROUTINE W REFLEX MICROSCOPIC
Bacteria, UA: NONE SEEN
Bilirubin Urine: NEGATIVE
Glucose, UA: >=500 mg/dL — AB
Ketones, ur: 5 mg/dL — AB
Leukocytes,Ua: NEGATIVE
Nitrite: NEGATIVE
Protein, ur: 100 mg/dL — AB
RBC / HPF: >50 RBC/hpf (ref 0–5)
Specific Gravity, Urine: 1.029 (ref 1.005–1.030)
pH: 5 (ref 5.0–8.0)

## 2022-07-15 LAB — CBC WITH DIFFERENTIAL/PLATELET
Abs Immature Granulocytes: 0.05 10*3/uL (ref 0.00–0.07)
Basophils Absolute: 0.1 10*3/uL (ref 0.0–0.1)
Basophils Relative: 0 %
Eosinophils Absolute: 0.1 10*3/uL (ref 0.0–0.5)
Eosinophils Relative: 1 %
HCT: 32.8 % — ABNORMAL LOW (ref 36.0–46.0)
Hemoglobin: 10.6 g/dL — ABNORMAL LOW (ref 12.0–15.0)
Immature Granulocytes: 0 %
Lymphocytes Relative: 15 %
Lymphs Abs: 1.8 10*3/uL (ref 0.7–4.0)
MCH: 24.2 pg — ABNORMAL LOW (ref 26.0–34.0)
MCHC: 32.3 g/dL (ref 30.0–36.0)
MCV: 74.9 fL — ABNORMAL LOW (ref 80.0–100.0)
Monocytes Absolute: 0.9 10*3/uL (ref 0.1–1.0)
Monocytes Relative: 8 %
Neutro Abs: 8.8 10*3/uL — ABNORMAL HIGH (ref 1.7–7.7)
Neutrophils Relative %: 76 %
Platelets: 403 10*3/uL — ABNORMAL HIGH (ref 150–400)
RBC: 4.38 MIL/uL (ref 3.87–5.11)
RDW: 15.8 % — ABNORMAL HIGH (ref 11.5–15.5)
WBC: 11.6 10*3/uL — ABNORMAL HIGH (ref 4.0–10.5)
nRBC: 0 % (ref 0.0–0.2)

## 2022-07-15 LAB — I-STAT BETA HCG BLOOD, ED (MC, WL, AP ONLY): I-stat hCG, quantitative: <5 m[IU]/mL (ref <5)

## 2022-07-15 LAB — LIPASE, BLOOD: Lipase: 21 U/L (ref 11–51)

## 2022-07-15 LAB — CBG MONITORING, ED: Glucose-Capillary: 339 mg/dL — ABNORMAL HIGH (ref 70–99)

## 2022-07-15 MED ORDER — LIDOCAINE HCL 2 % IJ SOLN
10.0000 mL | Freq: Once | INTRAMUSCULAR | Status: AC
  Administered 2022-07-15: 200 mg
  Filled 2022-07-15: qty 20

## 2022-07-15 MED ORDER — FENTANYL CITRATE PF 50 MCG/ML IJ SOSY
25.0000 ug | PREFILLED_SYRINGE | Freq: Once | INTRAMUSCULAR | Status: AC
  Administered 2022-07-15: 25 ug via INTRAVENOUS
  Filled 2022-07-15: qty 1

## 2022-07-15 MED ORDER — INSULIN ASPART 100 UNIT/ML IJ SOLN
5.0000 [IU] | Freq: Once | INTRAMUSCULAR | Status: AC
  Administered 2022-07-15: 5 [IU] via SUBCUTANEOUS
  Filled 2022-07-15: qty 0.05

## 2022-07-15 MED ORDER — BLOOD GLUCOSE MONITOR KIT: PACK | 0 refills | Status: DC

## 2022-07-15 MED ORDER — CEPHALEXIN 500 MG PO CAPS: 500.0000 mg | ORAL_CAPSULE | Freq: Four times a day (QID) | ORAL | 0 refills | Status: AC

## 2022-07-15 MED ORDER — OXYCODONE-ACETAMINOPHEN 5-325 MG PO TABS
2.0000 | ORAL_TABLET | Freq: Once | ORAL | Status: AC
  Administered 2022-07-15: 2 via ORAL
  Filled 2022-07-15: qty 2

## 2022-07-15 MED ORDER — PIPERACILLIN-TAZOBACTAM 3.375 G IVPB 30 MIN
3.3750 g | Freq: Once | INTRAVENOUS | Status: AC
  Administered 2022-07-15: 3.375 g via INTRAVENOUS
  Filled 2022-07-15: qty 50

## 2022-07-15 MED ORDER — VANCOMYCIN HCL IN DEXTROSE 1-5 GM/200ML-% IV SOLN
1000.0000 mg | Freq: Once | INTRAVENOUS | Status: AC
  Administered 2022-07-15: 1000 mg via INTRAVENOUS
  Filled 2022-07-15: qty 200

## 2022-07-15 MED ORDER — IOHEXOL 300 MG/ML  SOLN
100.0000 mL | Freq: Once | INTRAMUSCULAR | Status: AC | PRN
  Administered 2022-07-15: 100 mL via INTRAVENOUS

## 2022-07-15 MED ORDER — SULFAMETHOXAZOLE-TRIMETHOPRIM 800-160 MG PO TABS: 1.0000 | ORAL_TABLET | Freq: Two times a day (BID) | ORAL | 0 refills | Status: AC

## 2022-07-15 MED ORDER — VICTOZA 18 MG/3ML ~~LOC~~ SOPN: 1.8000 mg | PEN_INJECTOR | Freq: Every day | SUBCUTANEOUS | 2 refills | Status: DC

## 2022-07-15 MED ORDER — METFORMIN HCL 500 MG PO TABS: 1000.0000 mg | ORAL_TABLET | Freq: Two times a day (BID) | ORAL | 1 refills | Status: DC

## 2022-07-15 NOTE — Discharge Instructions (Addendum)
Thank you for letting us take care of you today.  We treated you for an abscess of your abdominal wall. Overall, the rest of your workup was reassuring. Your blood sugar is running high. I sent in a new prescription for your diabetes medications. Please take these as previously prescribed by your PCP. I also gave a prescription for a new glucose monitoring kit so you can check your sugars at home.  As discussed, I prescribed 2 antibiotics for you to take at home to treat your abscess. Please complete all of these antibiotics. Please have the area rechecked in 48-72 hours unless your symptoms have completely resolved. Use warm compresses as discussed and keep the area clean and dry. It may continue to drain and that is ok.   Please follow up with your previous PCP or a PCP of your own choosing in the next week to discuss your ED visit today and control of your chronic health conditions including your diabetes. I have provided information for 2 primary care clinics or you may go to a PCP of your own choosing.  For any worsening or new symptoms, please return to nearest emergency department for re-evaluation.

## 2022-07-15 NOTE — ED Triage Notes (Signed)
Pt reports lower abdominal pain since yesterday

## 2022-07-15 NOTE — ED Notes (Signed)
Pt c/o abdominal pain located in the area of an old c-section scar, that began yesterday. Pt states she is currently on her menstrual cycle, denies any n/v/d.

## 2022-07-15 NOTE — ED Provider Notes (Signed)
[place] [facility]MENT AT [facility] Provider Note   CSN: [id number] Arrival date & time: 07/15/22  1652     History  Chief Complaint  Patient presents with   Abdominal Pain    Evelyn Watts is a 47 y.o. female with past medical history hypertension, type 2 diabetes, asthma currently on no prescription medications due to lack of primary care follow-up for the last 3 years who presents to the ED complaining of lower abdominal pain for the last day.  Patient states that this is to the left of her previous C-section scar.  She has had previous C-section x 3 but this was many years ago. Other previous abdominal surgeries include cholecystectomy and hernia repair but these were years ago.  She denies associated fever, chills, nausea, vomiting, diarrhea, back pain, dysuria, hematuria, chest pain, shortness of breath, or other associated symptoms.  No recent antibiotics.  No known sick contacts.    Home Medications Not currently on prescription medications due to lack of primary care and financial difficulties  Allergies    Latex, Morphine and related, and Tape    Review of Systems   Review of Systems  All other systems reviewed and are negative.   Physical Exam Updated Vital Signs BP 135/87   Pulse 96   Temp 98.1 F (36.7 C)   Resp 17   Ht 5\' 11"  (1.803 m)   Wt 113 kg   LMP 07/15/2022   SpO2 97%   BMI 34.75 kg/m  Physical Exam Vitals and nursing note reviewed.  Constitutional:      General: She is not in acute distress.    Appearance: Normal appearance. She is not ill-appearing, toxic-appearing or diaphoretic.  HENT:     Head: Normocephalic and atraumatic.     Mouth/Throat:     Mouth: Mucous membranes are moist.  Eyes:     General: No scleral icterus.    Extraocular Movements: Extraocular movements intact.     Conjunctiva/sclera: Conjunctivae normal.     Pupils: Pupils are equal, round, and reactive to light.  Cardiovascular:     Rate and  Rhythm: Normal rate and regular rhythm.     Heart sounds: No murmur heard. Pulmonary:     Effort: Pulmonary effort is normal.     Breath sounds: Normal breath sounds.  Abdominal:     Palpations: Abdomen is soft.     Tenderness: There is abdominal tenderness (generalized lower worse to area of abscess noted). There is no right CVA tenderness, left CVA tenderness, guarding or rebound.     Comments: Pinpoint circular small abscess just left of midline to abdominal pannus with exquisite tenderness to palpation, minimal underlying induration, no fluctuance, ~4cm area surrounding erythema, no drainage  Musculoskeletal:        General: Normal range of motion.     Cervical back: Neck supple.     Right lower leg: No edema.     Left lower leg: No edema.  Skin:    General: Skin is warm and dry.     Capillary Refill: Capillary refill takes less than 2 seconds.  Neurological:     Mental Status: She is alert. Mental status is at baseline.  Psychiatric:        Behavior: Behavior normal.     ED Results / Procedures / Treatments   Labs (all labs ordered are listed, but only abnormal results are displayed) Labs Reviewed  CBC WITH DIFFERENTIAL/PLATELET - Abnormal; Notable for the following components:  Result Value   WBC 11.6 (*)    Hemoglobin 10.6 (*)    HCT 32.8 (*)    MCV 74.9 (*)    MCH 24.2 (*)    RDW 15.8 (*)    Platelets 403 (*)    Neutro Abs 8.8 (*)    All other components within normal limits  COMPREHENSIVE METABOLIC PANEL - Abnormal; Notable for the following components:   Sodium 128 (*)    Chloride 95 (*)    Glucose, Bld 382 (*)    Calcium 8.8 (*)    Albumin 3.3 (*)    AST 14 (*)    All other components within normal limits  URINALYSIS, ROUTINE W REFLEX MICROSCOPIC - Abnormal; Notable for the following components:   APPearance HAZY (*)    Glucose, UA >=500 (*)    Hgb urine dipstick LARGE (*)    Ketones, ur 5 (*)    Protein, ur 100 (*)    All other components within  normal limits  CBG MONITORING, ED - Abnormal; Notable for the following components:   Glucose-Capillary 339 (*)    All other components within normal limits  LIPASE, BLOOD  I-STAT BETA HCG BLOOD, ED ([facility], [facility], [facility] ONLY)    EKG EKG Interpretation  Date/Time:  Friday July 15 2022 17:59:35 EDT Ventricular Rate:  101 PR Interval:  146 QRS Duration: 84 QT Interval:  332 QTC Calculation: 431 R Axis:   11 Text Interpretation: Sinus tachycardia No significant change since last tracing Confirmed by Wandra Arthurs [phone]) on 07/15/2022 8:58:12 PM  Radiology DG Shoulder Right  Result Date: 07/15/2022 CLINICAL DATA:  Right shoulder pain.  No reported history of trauma EXAM: RIGHT SHOULDER - 3 VIEW COMPARISON:  None Available. FINDINGS: No fracture or dislocation. Preserved joint spaces and bone mineralization. Hyperostosis along the acromion. IMPRESSION: No acute osseous abnormality Electronically Signed   By: Jill Side M.D.   On: 07/15/2022 18:15    Procedures .Marland KitchenIncision and Drainage  Date/Time: 07/15/2022 9:30 PM  Performed by: Suzzette Righter, PA-C Authorized by: Suzzette Righter, PA-C   Consent:    Consent obtained:  Verbal   Consent given by:  Patient   Risks, benefits, and alternatives were discussed: yes     Risks discussed:  Bleeding, incomplete drainage, pain and infection   Alternatives discussed:  No treatment, delayed treatment, alternative treatment and observation Universal protocol:    Procedure explained and questions answered to patient or proxy's satisfaction: yes     Test results available : yes     Imaging studies available: yes     Immediately prior to procedure, a time out was called: yes     Patient identity confirmed:  Verbally with patient Location:    Type:  Abscess   Size:  1   Location:  Trunk   Trunk location:  Abdomen Pre-procedure details:    Skin preparation:  Povidone-iodine Sedation:    Sedation type:  None Anesthesia:    Anesthesia method:   Local infiltration   Local anesthetic:  Lidocaine 2% w/o epi Procedure type:    Complexity:  Simple Procedure details:    Ultrasound guidance: no     Incision types:  Single straight   Incision depth:  Subcutaneous   Wound management:  Probed and deloculated, irrigated with saline and extensive cleaning   Drainage:  Bloody and purulent   Drainage amount:  Scant   Wound treatment:  Wound left open   Packing materials:  None Post-procedure  details:    Procedure completion:  Tolerated well, no immediate complications     Medications Ordered in ED Medications  oxyCODONE-acetaminophen (PERCOCET/ROXICET) 5-325 MG per tablet 2 tablet (2 tablets Oral Given 07/15/22 1829)  vancomycin (VANCOCIN) IVPB 1000 mg/200 mL premix (1,000 mg Intravenous New Bag/Given 07/15/22 1857)  iohexol (OMNIPAQUE) 300 MG/ML solution 100 mL (100 mLs Intravenous Contrast Given 07/15/22 1937)  insulin aspart (novoLOG) injection 5 Units (5 Units Subcutaneous Given 07/15/22 2226)  piperacillin-tazobactam (ZOSYN) IVPB 3.375 g (3.375 g Intravenous New Bag/Given 07/15/22 2130)  fentaNYL (SUBLIMAZE) injection 25 mcg (25 mcg Intravenous Given 07/15/22 2111)  lidocaine (XYLOCAINE) 2 % (with pres) injection 200 mg (200 mg Infiltration Given by Other 07/15/22 2130)    ED Course/ Medical Decision Making/ A&P                             Medical Decision Making Amount and/or Complexity of Data Reviewed Labs: ordered. Decision-making details documented in ED Course. Radiology: ordered. Decision-making details documented in ED Course.  Risk Prescription drug management.   Medical Decision Making:   Glenetta Splitt is a 47 y.o. female who presented to the ED today with abdominal pain detailed above.    Patient's presentation is complicated by their history of multiple currently untreated comorbidities, abdominal obesity.  Patient placed on continuous vitals and telemetry monitoring while in ED which was reviewed periodically.   Complete initial physical exam performed, notably the patient had an abscess to the left of the midline of the abdominal pannus.  This was approximately 1 cm, circular, and with surrounding induration and erythema but no areas of fluctuance.  Lower abdomen was diffusely tender but soft otherwise.  There is no rebound or guarding.  No CVA tenderness. Reviewed and confirmed nursing documentation for past medical history, family history, social history.    Initial Assessment:   With the patient's presentation of abdominal pain, most likely diagnosis is abdominal wall abscess.  Differential diagnosis includes but is not limited to cellulitis, cyst, complicated abscess, DKA, gastritis, strangulated hernia, incarcerated hernia, gastroenteritis, AMI, nephrolithiasis, pancreatitis, peritonitis, adrenal insufficiency, UTI, SBO/LBO, biliary disease, IBD, IBS, PUD, or hepatitis.  This is most consistent with an acute complicated illness  Initial Plan:  Screening labs including CBC and Metabolic panel to evaluate for infectious or metabolic etiology of disease.  Urinalysis with reflex culture ordered to evaluate for UTI or relevant urologic/nephrologic pathology.  Lipase to evaluate for pancreatitis EKG to evaluate for cardiac pathology Right shoulder XR -patient complains of ongoing right shoulder pain for many months to nursing staff.  She did not express this complaint to me.  She has full range of motion of the right upper extremity, no obvious deformity, no overlying skin changes.  Patient requesting right shoulder x-ray.  Did order this but do not believe it is related to her complaint today. CTAP to evaluate for intra-abdominal pathology Bedside US with Dr. Darl Householder to evaluate abscess collection Started on Vancomycin for antibiotic therapy while waiting in ED - Pt developed itching after starting infusion and thus antibiotics were discontinued  Objective evaluation as reviewed   Initial Study Results:    Laboratory  All laboratory results reviewed without evidence of clinically relevant pathology.   Exceptions include: Na 128, WBC 11.6, Hgb 10.6   EKG EKG was reviewed independently. ST segments without concerns for elevations.   EKG: unchanged from previous tracings, sinus tachycardia.   Radiology:  All images reviewed independently.  Agree with radiology report at this time.   DG Shoulder Right  Result Date: 07/15/2022 CLINICAL DATA:  Right shoulder pain.  No reported history of trauma EXAM: RIGHT SHOULDER - 3 VIEW COMPARISON:  None Available. FINDINGS: No fracture or dislocation. Preserved joint spaces and bone mineralization. Hyperostosis along the acromion. IMPRESSION: No acute osseous abnormality Electronically Signed   By: Jill Side M.D.   On: 07/15/2022 18:15    CLINICAL DATA: Left pannus abscess, suprapubic and left lower quadrant pain  EXAM: CT ABDOMEN AND PELVIS WITH CONTRAST  TECHNIQUE: Multidetector CT imaging of the abdomen and pelvis was performed using the standard protocol following bolus administration of intravenous contrast.  RADIATION DOSE REDUCTION: This exam was performed according to the departmental dose-optimization program which includes automated exposure control, adjustment of the mA and/or kV according to patient size and/or use of iterative reconstruction technique.  CONTRAST: 139mL OMNIPAQUE IOHEXOL 300 MG/ML SOLN  COMPARISON: 08/17/2017  FINDINGS: Lower chest: No pleural or pericardial effusion. Visualized lung bases clear.  Hepatobiliary: Hepatomegaly. The previously described segment 4B hypoechoic masslike region has regressed with no discrete focal liver abnormality identified. Status post cholecystectomy. No biliary dilatation.  Pancreas: Unremarkable. No pancreatic ductal dilatation or surrounding inflammatory changes.  Spleen: Normal in size without focal abnormality. Stable small accessory splenule.  Adrenals/Urinary Tract:  Adrenal glands are unremarkable. Kidneys are normal, without renal calculi, focal lesion, or hydronephrosis. Bladder is unremarkable.  Stomach/Bowel: Stomach is incompletely distended, unremarkable. Small bowel is decompressed. Multiple small bowel loops extend into a large left lower quadrant ventral hernia. The appendix also extends into the hernia. The colon is partially distended by gas and fecal material, without acute finding.  Vascular/Lymphatic: No significant vascular findings are present. No enlarged abdominal or pelvic lymph nodes.  Reproductive: Calcified fundal fibroid. No adnexal mass.  Other: No ascites. No free air.  Musculoskeletal: Ventral hernia mesh is chronically displaced with a large recurrent hernia extending left inferolaterally, involving multiple loops of bowel, appendix, and associated mesentery, without of evidence of obstruction or strangulation. No ascites. No free air.  There is a poorly marginated 1.7 cm subcutaneous fluid collection at the inferior margin of abdominal wall pannus, with significant regional inflammatory/edematous changes, consistent with small abscess.  Facet DJD in the lower lumbar spine. No fracture or worrisome bone lesion.  IMPRESSION: 1. 1.7 cm subcutaneous abscess at the inferior margin of the abdominal wall pannus. 2. Chronic large recurrent ventral hernia involving multiple loops of bowel, appendix, and associated mesentery, without evidence of obstruction or strangulation. 3. Hepatomegaly.   Electronically Signed By: Lucrezia Europe M.D. On: 07/15/2022 20:10    Final Assessment and Plan:   This is a 47 year old female presenting to the ED complaining of abdominal pain since yesterday.  No significant associated symptoms.  Of note, patient currently not managing her chronic conditions due to lack of primary care follow-up.  She does state that she has insurance and is able to obtain prescription medications as  prescribed.  History of remote C-sections and hernia repair.  On exam, patient has small pinpoint abscess just left of previous C-section scar to her abdominal pannus.  There is significant surrounding erythema.  Area appears mostly indurated, no significant fluctuance.  Patient is diffusely tender to the lower abdomen however.  She was tachycardic on arrival but otherwise vital signs stable.  Bedside ultrasound does show a small collection of infection consistent with an abscess but a bit difficult to assess depth and extension of the abscess.  With this, workup obtained as above for further assessment.  Patient has a mild leukocytosis at 11.6.  Her blood sugar is in the high 300s.  This is consistent with previous blood glucose readings while in the ED.  She does not have an anion gap and does not appear to be acidotic.  Suspect that elevated blood sugar is due to lack of ability to adhere to home medication regimen as pt indicated her previous primary care has been non-responsive to her requests for medication refills and thus she has not been able to take previously prescribed diabetic medications as she is supposed to. On CT scan, pt has 1.7cm subcutaneous abscess to abdominal pannus but no other significant, acute findings. With this, discussed case with attending physician and plan to drain abscess. Initiated pt on Vancomycin therapy, however, she had some resultant itching so this was discontinued. Antibiotic switched to Zosyn which pt tolerated well. Incision and drainage completed. Small amount of purulent drainage with improvement in induration. Pt tolerated well. Extensive discussion with pt regarding importance of completing antibiotic course, managing diabetes. Pt expressed understanding of this. Rx sent for Bactrim and Keflex. Pt's previous diabetes medications refilled and she was provided with PCP follow up. Strict ED return precautions given, all questions answered, and pt stable for discharge.     Clinical Impression:  1. Abdominal wall abscess   2. Type 2 diabetes mellitus with hyperglycemia, with long-term current use of insulin (HCC) Chronic  3. Uncontrolled type 2 diabetes mellitus with hyperglycemia [facility])      Discharge           Final Clinical Impression(s) / ED Diagnoses Final diagnoses:  Abdominal wall abscess  Uncontrolled type 2 diabetes mellitus with hyperglycemia ([place])    Rx / DC Orders ED Discharge Orders          Ordered    sulfamethoxazole-trimethoprim (BACTRIM DS) 800-160 MG tablet  2 times daily        07/15/22 2213    cephALEXin (KEFLEX) 500 MG capsule  4 times daily        07/15/22 2213    blood glucose meter kit and supplies KIT        07/15/22 2216    metFORMIN (GLUCOPHAGE) 500 MG tablet  2 times daily with meals        07/15/22 2216    liraglutide (VICTOZA) 18 MG/3ML SOPN  Daily        07/15/22 2216              Suzzette Righter, PA-C 07/15/22 2259    Drenda Freeze, MD 07/15/22 2322

## 2022-07-18 ENCOUNTER — Encounter (HOSPITAL_COMMUNITY): Payer: Self-pay

## 2022-07-18 ENCOUNTER — Other Ambulatory Visit: Payer: Self-pay

## 2022-07-18 ENCOUNTER — Emergency Department (HOSPITAL_COMMUNITY)
Admission: EM | Admit: 2022-07-18 | Discharge: 2022-07-18 | Disposition: A | Discharge status: Home / Self Care | Payer: Medicare HMO | Attending: Emergency Medicine | Admitting: Emergency Medicine

## 2022-07-18 DIAGNOSIS — Y768 Miscellaneous obstetric and gynecological devices associated with adverse incidents, not elsewhere classified: Secondary | ICD-10-CM | POA: Diagnosis not present

## 2022-07-18 DIAGNOSIS — T8149XA Infection following a procedure, other surgical site, initial encounter: Secondary | ICD-10-CM | POA: Diagnosis present

## 2022-07-18 DIAGNOSIS — R6883 Chills (without fever): Secondary | ICD-10-CM | POA: Diagnosis not present

## 2022-07-18 DIAGNOSIS — Z5321 Procedure and treatment not carried out due to patient leaving prior to being seen by health care provider: Secondary | ICD-10-CM | POA: Insufficient documentation

## 2022-07-18 NOTE — ED Triage Notes (Signed)
C/o pain and infection to c-section scar and I&D recently.  Purulent drainage with chills.  Seen on 3/22 and prescribed antibiotics but unable to get them filled.

## 2022-07-18 NOTE — ED Notes (Signed)
Pt was walking out of the ED with Husband. Not if  pt coming back

## 2022-07-18 NOTE — ED Provider Triage Note (Signed)
Emergency Medicine Provider Triage Evaluation Note  Evelyn Watts , a 47 y.o. female  was evaluated in triage.  Pt complains of infection to her C-section scar.  States that she was here on 07/15/2022 and apparently had I&D at that time.  They placed her on antibiotics but she states her pharmacy was closed all weekend.  She continues to have pain, purulent drainage, chills without objective fever.  On chart review had CT done on 07/15/2022 which showed a 1.7 cm subcutaneous abscess at the inferior margin of the abdominal wall pannus.  Review of Systems  Positive: See above Negative:   Physical Exam  BP 128/88 (BP Location: Left Arm)   Pulse 98   Temp 98.3 F (36.8 C) (Oral)   Resp 16   LMP 07/15/2022   SpO2 100%  Gen:   Awake, no distress   Resp:  Normal effort  MSK:   Moves extremities without difficulty  Other:  Foul-smelling purulent drainage coming from the inferior abdominal wall, peers to have cellulitis surrounding the area.  Medical Decision Making  Medically screening exam initiated at 1:18 PM.  Appropriate orders placed.  Ashanti Philpot was informed that the remainder of the evaluation will be completed by another provider, this initial triage assessment does not replace that evaluation, and the importance of remaining in the ED until their evaluation is complete.     Mickie Hillier, PA-C 07/18/22 1319

## 2022-07-28 ENCOUNTER — Encounter (HOSPITAL_COMMUNITY): Payer: Self-pay | Admitting: Pharmacy Technician

## 2022-07-28 ENCOUNTER — Emergency Department (HOSPITAL_COMMUNITY)
Admission: EM | Admit: 2022-07-28 | Discharge: 2022-07-28 | Disposition: A | Discharge status: Home / Self Care | Payer: Medicare HMO | Attending: Emergency Medicine | Admitting: Emergency Medicine

## 2022-07-28 DIAGNOSIS — Z7984 Long term (current) use of oral hypoglycemic drugs: Secondary | ICD-10-CM | POA: Diagnosis not present

## 2022-07-28 DIAGNOSIS — E1165 Type 2 diabetes mellitus with hyperglycemia: Secondary | ICD-10-CM | POA: Insufficient documentation

## 2022-07-28 DIAGNOSIS — Z794 Long term (current) use of insulin: Secondary | ICD-10-CM | POA: Insufficient documentation

## 2022-07-28 DIAGNOSIS — B3731 Acute candidiasis of vulva and vagina: Secondary | ICD-10-CM

## 2022-07-28 DIAGNOSIS — L02412 Cutaneous abscess of left axilla: Secondary | ICD-10-CM | POA: Diagnosis not present

## 2022-07-28 DIAGNOSIS — Z91040 Latex allergy status: Secondary | ICD-10-CM | POA: Diagnosis not present

## 2022-07-28 DIAGNOSIS — M79602 Pain in left arm: Secondary | ICD-10-CM | POA: Diagnosis present

## 2022-07-28 DIAGNOSIS — L02419 Cutaneous abscess of limb, unspecified: Secondary | ICD-10-CM

## 2022-07-28 LAB — BASIC METABOLIC PANEL
Anion gap: 9 (ref 5–15)
BUN: 16 mg/dL (ref 6–20)
CO2: 24 mmol/L (ref 22–32)
Calcium: 9 mg/dL (ref 8.9–10.3)
Chloride: 96 mmol/L — ABNORMAL LOW (ref 98–111)
Creatinine, Ser: 0.63 mg/dL (ref 0.44–1.00)
GFR, Estimated: >60 mL/min (ref >60)
Glucose, Bld: 403 mg/dL — ABNORMAL HIGH (ref 70–99)
Potassium: 4 mmol/L (ref 3.5–5.1)
Sodium: 129 mmol/L — ABNORMAL LOW (ref 135–145)

## 2022-07-28 LAB — CBC WITH DIFFERENTIAL/PLATELET
Abs Immature Granulocytes: 0.04 10*3/uL (ref 0.00–0.07)
Basophils Absolute: 0.1 10*3/uL (ref 0.0–0.1)
Basophils Relative: 1 %
Eosinophils Absolute: 0.2 10*3/uL (ref 0.0–0.5)
Eosinophils Relative: 2 %
HCT: 33.2 % — ABNORMAL LOW (ref 36.0–46.0)
Hemoglobin: 10.7 g/dL — ABNORMAL LOW (ref 12.0–15.0)
Immature Granulocytes: 0 %
Lymphocytes Relative: 20 %
Lymphs Abs: 1.8 10*3/uL (ref 0.7–4.0)
MCH: 23.8 pg — ABNORMAL LOW (ref 26.0–34.0)
MCHC: 32.2 g/dL (ref 30.0–36.0)
MCV: 73.9 fL — ABNORMAL LOW (ref 80.0–100.0)
Monocytes Absolute: 0.5 10*3/uL (ref 0.1–1.0)
Monocytes Relative: 5 %
Neutro Abs: 6.4 10*3/uL (ref 1.7–7.7)
Neutrophils Relative %: 72 %
Platelets: 353 10*3/uL (ref 150–400)
RBC: 4.49 MIL/uL (ref 3.87–5.11)
RDW: 15.3 % (ref 11.5–15.5)
WBC: 9 10*3/uL (ref 4.0–10.5)
nRBC: 0 % (ref 0.0–0.2)

## 2022-07-28 MED ORDER — FLUCONAZOLE 150 MG PO TABS: 150.0000 mg | ORAL_TABLET | Freq: Every day | ORAL | 0 refills | Status: DC

## 2022-07-28 MED ORDER — NYSTATIN 100000 UNIT/GM EX POWD
Freq: Once | CUTANEOUS | Status: AC
  Filled 2022-07-28: qty 15

## 2022-07-28 MED ORDER — NYSTATIN 100000 UNIT/GM EX CREA: TOPICAL_CREAM | CUTANEOUS | 0 refills | Status: DC

## 2022-07-28 MED ORDER — LIDOCAINE HCL (PF) 1 % IJ SOLN
5.0000 mL | Freq: Once | INTRAMUSCULAR | Status: AC
  Administered 2022-07-28: 5 mL
  Filled 2022-07-28: qty 30

## 2022-07-28 MED ORDER — OXYCODONE HCL 5 MG PO TABS
10.0000 mg | ORAL_TABLET | Freq: Once | ORAL | Status: AC
  Administered 2022-07-28: 10 mg via ORAL
  Filled 2022-07-28: qty 2

## 2022-07-28 MED ORDER — INSULIN GLARGINE 100 UNIT/ML ~~LOC~~ SOLN
30.0000 [IU] | Freq: Two times a day (BID) | SUBCUTANEOUS | 3 refills | Status: DC
Start: 2022-07-28 — End: 2022-09-08

## 2022-07-28 MED ORDER — FLUCONAZOLE 150 MG PO TABS
150.0000 mg | ORAL_TABLET | Freq: Once | ORAL | Status: AC
  Administered 2022-07-28: 150 mg via ORAL
  Filled 2022-07-28: qty 1

## 2022-07-28 MED ORDER — GABAPENTIN 100 MG PO CAPS: 100.0000 mg | ORAL_CAPSULE | Freq: Three times a day (TID) | ORAL | 0 refills | Status: DC

## 2022-07-28 NOTE — ED Provider Triage Note (Signed)
Emergency Medicine Provider Triage Evaluation Note  Evelyn Watts , a 47 y.o. female  was evaluated in triage.  Pt complains of pain in her genitalia near at the bottom of her previous C-section scar.  Remote history of C-section.  Had an area of an abscess to this area recently which was drained and for which she completed antibiotics.  No abdominal pain, fever, chills, nausea, or vomiting.  Also complains of right shoulder pain.  Had an x-ray of her right shoulder which was normal.  Review of Systems  Positive: See HPI Negative: See HPI  Physical Exam  BP (!) 126/91   Pulse 100   Temp 98.3 F (36.8 C)   Resp 16   LMP 07/15/2022   SpO2 93%  Gen:   Awake, no distress   Resp:  Normal effort  MSK:   Moves extremities without difficulty  Other:  Abdomen soft and nontender  Medical Decision Making  Medically screening exam initiated at 4:09 PM.  Appropriate orders placed.  Evelyn Watts was informed that the remainder of the evaluation will be completed by another provider, this initial triage assessment does not replace that evaluation, and the importance of remaining in the ED until their evaluation is complete.     Suzzette Righter, PA-C 07/28/22 1610

## 2022-07-28 NOTE — ED Triage Notes (Signed)
Pt here with complaints of being "raw" near her c-section scar. Pt also complains of R arm pain with no known injury. Pt also complains of an abscess to L axilla onset several days ago.

## 2022-07-28 NOTE — ED Provider Notes (Signed)
[facility] [place] Provider Note   CSN: [id number] Arrival date & time: 07/28/22  1549     History Chief Complaint  Patient presents with   Multiple Complaints    Evelyn Watts is a 47 y.o. female patient who presents to the emergency department today with multiple complaints.  She complains of a boil over the left axilla.  She also complains of being "raw" in the pubic area.  This is been ongoing for 3 to 4 weeks.  She states that she had an infection over her previous C-section incision site and was on antibiotics around the time this started.  She states that she has noticed some creamy white discharge as well and the area is very pruritic.  She denies fevers or chills.  HPI     Home Medications Prior to Admission medications   Medication Sig Start Date End Date Taking? Authorizing Provider  fluconazole (DIFLUCAN) 150 MG tablet Take 1 tablet (150 mg total) by mouth daily. 07/29/22  Yes Raul Del, Thurley Francesconi M, PA-C  gabapentin (NEURONTIN) 100 MG capsule Take 1 capsule (100 mg total) by mouth 3 (three) times daily. 07/28/22  Yes Raul Del, Emely Fahy M, PA-C  nystatin cream (MYCOSTATIN) Apply to affected area 2 times daily 07/28/22  Yes Raul Del, Towanna Avery M, PA-C  albuterol (VENTOLIN HFA) 108 (90 Base) MCG/ACT inhaler Inhale 2 puffs into the lungs every 4 (four) hours as needed for wheezing. 02/12/20   Loura Halt A, NP  blood glucose meter kit and supplies KIT Dispense based on patient and insurance preference. Use up to four times daily as directed. (FOR ICD-9 250.00, 250.01). 07/15/22   Gowens, Mariah L, PA-C  cetirizine (ZYRTEC) 10 MG tablet Take 1 tablet (10 mg total) by mouth daily. 02/12/20   Loura Halt A, NP  cyclobenzaprine (FLEXERIL) 10 MG tablet Take 1 tablet (10 mg total) by mouth 2 (two) times daily as needed for muscle spasms. 10/03/20   Sherrill Raring, PA-C  diclofenac Sodium (VOLTAREN) 1 % GEL Apply 4 g topically 4 (four) times daily. 02/20/20   Stover,  Titorya, DPM  glucose blood test strip Use as instructed. Check blood glucose levels by fingerstick twice per day 11/08/19   Robinson, [place] N, PA-C  ibuprofen (ADVIL) 600 MG tablet Take 1 tablet (600 mg total) by mouth every 6 (six) hours as needed for moderate pain. 08/06/21   Redwine, Madison A, PA-C  insulin glargine (LANTUS) 100 UNIT/ML injection Inject 0.3 mLs (30 Units total) into the skin 2 (two) times daily. 07/28/22   Myna Bright M, PA-C  lidocaine (LIDODERM) 5 % Place 1 patch onto the skin daily. Remove & Discard patch within 12 hours or as directed by MD Patient not taking: Reported on 01/28/2021 02/28/20   Gareth Morgan, MD  liraglutide (VICTOZA) 18 MG/3ML SOPN Inject 1.8 mg into the skin daily. 07/15/22 10/13/22  Gowens, Mariah L, PA-C  lisinopril (PRINIVIL,ZESTRIL) 20 MG tablet Take 1 tablet (20 mg total) by mouth daily. 02/14/18   Gildardo Pounds, NP  metFORMIN (GLUCOPHAGE) 500 MG tablet Take 2 tablets (1,000 mg total) by mouth 2 (two) times daily with a meal. 07/15/22 08/14/22  Gowens, Mariah L, PA-C  Misc. Devices MISC Please provide patient with glucerna for nutritional support. 02/14/18   Gildardo Pounds, NP  naproxen (NAPROSYN) 500 MG tablet Take 1 tablet (500 mg total) by mouth 2 (two) times daily. Patient not taking: Reported on 01/28/2021 10/03/20   Sherrill Raring, PA-C  oxyCODONE-acetaminophen [facility])  5-325 MG tablet Take 1 tablet by mouth every 6 (six) hours as needed for moderate pain or severe pain. 12/13/20   Domenic Moras, PA-C  atorvastatin (LIPITOR) 20 MG tablet Take 1 tablet (20 mg total) by mouth daily. Patient not taking: Reported on 02/09/2019 02/14/18 02/12/20  Gildardo Pounds, NP      Allergies    Latex, Morphine and related, and Tape    Review of Systems   Review of Systems  All other systems reviewed and are negative.   Physical Exam Updated Vital Signs BP (!) 126/91   Pulse 100   Temp 98.3 F (36.8 C)   Resp 16   LMP 07/15/2022   SpO2 93%   Physical Exam Vitals and nursing note reviewed.  Constitutional:      Appearance: Normal appearance.  HENT:     Head: Normocephalic and atraumatic.  Eyes:     General:        Right eye: No discharge.        Left eye: No discharge.     Conjunctiva/sclera: Conjunctivae normal.  Pulmonary:     Effort: Pulmonary effort is normal.  Genitourinary:    Comments: The entire pelvic area bilaterally is raw in appearance with areas of creamy white discharge.  Vaginal labia are involved as well. Skin:    General: Skin is warm and dry.     Findings: No rash.     Comments: There is a small 3 cm area of purulence in the left axilla region. Not actively draining.  Neurological:     General: No focal deficit present.     Mental Status: She is alert.  Psychiatric:        Mood and Affect: Mood normal.        Behavior: Behavior normal.     ED Results / Procedures / Treatments   Labs (all labs ordered are listed, but only abnormal results are displayed) Labs Reviewed  CBC WITH DIFFERENTIAL/PLATELET - Abnormal; Notable for the following components:      Result Value   Hemoglobin 10.7 (*)    HCT 33.2 (*)    MCV 73.9 (*)    MCH 23.8 (*)    All other components within normal limits  BASIC METABOLIC PANEL - Abnormal; Notable for the following components:   Sodium 129 (*)    Chloride 96 (*)    Glucose, Bld 403 (*)    All other components within normal limits    EKG None  Radiology No results found.  Procedures .Marland KitchenIncision and Drainage  Date/Time: 07/28/2022 7:44 PM  Performed by: Hendricks Limes, PA-C Authorized by: Hendricks Limes, PA-C   Consent:    Consent obtained:  Verbal   Consent given by:  Patient   Risks discussed:  Bleeding   Alternatives discussed:  No treatment Universal protocol:    Procedure explained and questions answered to patient or proxy's satisfaction: yes     Relevant documents present and verified: yes     Test results available : yes     Imaging  studies available: yes     Required blood products, implants, devices, and special equipment available: no     Site/side marked: no     Immediately prior to procedure, a time out was called: no     Patient identity confirmed:  Verbally with patient and arm band Location:    Type:  Abscess   Location: Left axilla. Pre-procedure details:    Skin preparation:  Povidone-iodine Sedation:  Sedation type:  None Anesthesia:    Anesthesia method:  Local infiltration   Local anesthetic:  Lidocaine 1% w/o epi Procedure type:    Complexity:  Complex Procedure details:    Ultrasound guidance: no     Needle aspiration: yes     Needle size:  18 G   Incision types:  Stab incision   Wound management:  Probed and deloculated and irrigated with saline   Drainage:  Bloody and purulent   Drainage amount:  Moderate   Wound treatment:  Wound left open   Packing materials:  None Post-procedure details:    Procedure completion:  Tolerated well, no immediate complications     Medications Ordered in ED Medications  lidocaine (PF) (XYLOCAINE) 1 % injection 5 mL (has no administration in time range)  oxyCODONE (Oxy IR/ROXICODONE) immediate release tablet 10 mg (has no administration in time range)  fluconazole (DIFLUCAN) tablet 150 mg (has no administration in time range)  nystatin (MYCOSTATIN/NYSTOP) topical powder ( Topical Given 07/28/22 1855)    ED Course/ Medical Decision Making/ A&P   {   Click here for ABCD2, HEART and other calculators  Medical Decision Making Marisella Saggio is a 47 y.o. female patient who presents to the emergency department today for further evaluation of a left axillary abscess and questionable infection over the pelvic area.  I suspect that the area in question over the pelvic area including the vaginal labia is likely a fungal infection given the history and appearance.  I will plan to apply some nystatin powder to the area.  Will plan to get some basic labs as well  and do a needle aspiration of the left axillary abscess once on the labs result.  I performed an incision and drainage over the left axillary abscess.  Also applied nystatin powder to the area and the itching has resolved.  I will give her 1 dose of Diflucan here and then 2 more doses starting tomorrow.  Have also given her prescription for gabapentin for chronic pain.  Strict return precautions were discussed.  She is safe for discharge.  Risk Prescription drug management.   Final Clinical Impression(s) / ED Diagnoses Final diagnoses:  Type 2 diabetes mellitus with hyperglycemia, with long-term current use of insulin    Rx / DC Orders ED Discharge Orders          Ordered    fluconazole (DIFLUCAN) 150 MG tablet  Daily        07/28/22 1950    insulin glargine (LANTUS) 100 UNIT/ML injection  2 times daily        07/28/22 1947    gabapentin (NEURONTIN) 100 MG capsule  3 times daily        07/28/22 1947    nystatin cream (MYCOSTATIN)        07/28/22 1950              Hendricks Limes, PA-C 07/28/22 1952    Wyvonnia Dusky, MD 07/28/22 2102

## 2022-07-28 NOTE — Discharge Instructions (Addendum)
I have given you a printed prescription for gabapentin and your insulin like we discussed. I have also provided you with the fungal cream and pills. Please take as prescribed. You may return to the ED for any worsening symptoms.

## 2022-08-13 ENCOUNTER — Other Ambulatory Visit: Payer: Self-pay

## 2022-08-13 ENCOUNTER — Emergency Department (HOSPITAL_COMMUNITY)
Admission: EM | Admit: 2022-08-13 | Discharge: 2022-08-13 | Disposition: A | Discharge status: Home / Self Care | Payer: Medicare HMO | Attending: Emergency Medicine | Admitting: Emergency Medicine

## 2022-08-13 ENCOUNTER — Encounter (HOSPITAL_COMMUNITY): Payer: Self-pay

## 2022-08-13 ENCOUNTER — Emergency Department (HOSPITAL_COMMUNITY): Payer: Medicare HMO

## 2022-08-13 DIAGNOSIS — Z91040 Latex allergy status: Secondary | ICD-10-CM | POA: Diagnosis not present

## 2022-08-13 DIAGNOSIS — Z794 Long term (current) use of insulin: Secondary | ICD-10-CM | POA: Diagnosis not present

## 2022-08-13 DIAGNOSIS — M25511 Pain in right shoulder: Secondary | ICD-10-CM | POA: Insufficient documentation

## 2022-08-13 MED ORDER — DICLOFENAC SODIUM 75 MG PO TBEC: 75.0000 mg | DELAYED_RELEASE_TABLET | Freq: Two times a day (BID) | ORAL | 0 refills | Status: DC

## 2022-08-13 MED ORDER — ACETAMINOPHEN 325 MG PO TABS
650.0000 mg | ORAL_TABLET | Freq: Once | ORAL | Status: AC
  Administered 2022-08-13: 650 mg via ORAL
  Filled 2022-08-13: qty 2

## 2022-08-13 NOTE — ED Provider Notes (Signed)
[place] EMERGENCY DEPARTMENT AT [facility] Provider Note   CSN: [id number] Arrival date & time: 08/13/22  1548     History  Chief Complaint  Patient presents with   Shoulder Pain   Foot Pain    Evelyn Watts is a 47 y.o. female.  Reports that she has been having shoulder pain for the past year patient also reports that she has been having pain in both of her feet for over a year.  Patient reports she saw a podiatrist but her primary care doctor would not clear her for surgery.  The history is provided by the patient. No language interpreter was used.  Shoulder Pain Location:  Shoulder Injury: no   Pain details:    Quality:  Aching   Radiates to:  Does not radiate   Severity:  Moderate   Onset quality:  Gradual   Timing:  Constant   Progression:  Worsening Dislocation: no   Foreign body present:  No foreign bodies Relieved by:  Nothing Worsened by:  Nothing Ineffective treatments:  None tried Associated symptoms: no fever   Risk factors: no known bone disorder   Foot Pain       Home Medications Prior to Admission medications   Medication Sig Start Date End Date Taking? Authorizing Provider  albuterol (VENTOLIN HFA) 108 (90 Base) MCG/ACT inhaler Inhale 2 puffs into the lungs every 4 (four) hours as needed for wheezing. 02/12/20   Dahlia Byes A, NP  blood glucose meter kit and supplies KIT Dispense based on patient and insurance preference. Use up to four times daily as directed. (FOR ICD-9 250.00, 250.01). 07/15/22   Gowens, Mariah L, PA-C  cetirizine (ZYRTEC) 10 MG tablet Take 1 tablet (10 mg total) by mouth daily. 02/12/20   Dahlia Byes A, NP  cyclobenzaprine (FLEXERIL) 10 MG tablet Take 1 tablet (10 mg total) by mouth 2 (two) times daily as needed for muscle spasms. 10/03/20   Theron Arista, PA-C  diclofenac Sodium (VOLTAREN) 1 % GEL Apply 4 g topically 4 (four) times daily. 02/20/20   Asencion Islam, DPM  fluconazole (DIFLUCAN) 150 MG tablet Take 1  tablet (150 mg total) by mouth daily. 07/29/22   Honor Loh M, PA-C  gabapentin (NEURONTIN) 100 MG capsule Take 1 capsule (100 mg total) by mouth 3 (three) times daily. 07/28/22   Honor Loh M, PA-C  glucose blood test strip Use as instructed. Check blood glucose levels by fingerstick twice per day 11/08/19   Robinson, [place] N, PA-C  ibuprofen (ADVIL) 600 MG tablet Take 1 tablet (600 mg total) by mouth every 6 (six) hours as needed for moderate pain. 08/06/21   Redwine, Madison A, PA-C  insulin glargine (LANTUS) 100 UNIT/ML injection Inject 0.3 mLs (30 Units total) into the skin 2 (two) times daily. 07/28/22   Honor Loh M, PA-C  lidocaine (LIDODERM) 5 % Place 1 patch onto the skin daily. Remove & Discard patch within 12 hours or as directed by MD Patient not taking: Reported on 01/28/2021 02/28/20   Alvira Monday, MD  liraglutide (VICTOZA) 18 MG/3ML SOPN Inject 1.8 mg into the skin daily. 07/15/22 10/13/22  Gowens, Mariah L, PA-C  lisinopril (PRINIVIL,ZESTRIL) 20 MG tablet Take 1 tablet (20 mg total) by mouth daily. 02/14/18   Claiborne Rigg, NP  metFORMIN (GLUCOPHAGE) 500 MG tablet Take 2 tablets (1,000 mg total) by mouth 2 (two) times daily with a meal. 07/15/22 08/14/22  Gowens, Mariah L, PA-C  Misc. Devices MISC Please provide  patient with glucerna for nutritional support. 02/14/18   Claiborne Rigg, NP  naproxen (NAPROSYN) 500 MG tablet Take 1 tablet (500 mg total) by mouth 2 (two) times daily. Patient not taking: Reported on 01/28/2021 10/03/20   Theron Arista, PA-C  nystatin cream (MYCOSTATIN) Apply to affected area 2 times daily 07/28/22   Teressa Lower, PA-C  oxyCODONE-acetaminophen (PERCOCET) 5-325 MG tablet Take 1 tablet by mouth every 6 (six) hours as needed for moderate pain or severe pain. 12/13/20   Fayrene Helper, PA-C  atorvastatin (LIPITOR) 20 MG tablet Take 1 tablet (20 mg total) by mouth daily. Patient not taking: Reported on 02/09/2019 02/14/18 02/12/20  Claiborne Rigg, NP       Allergies    Latex, Morphine and related, and Tape    Review of Systems   Review of Systems  Constitutional:  Negative for fever.  Musculoskeletal:  Positive for arthralgias.  All other systems reviewed and are negative.   Physical Exam Updated Vital Signs BP (!) 144/114 (BP Location: Left Arm)   Pulse 98   Temp 98.5 F (36.9 C) (Oral)   Resp 18   Wt 112.9 kg   LMP 07/15/2022   SpO2 100%   BMI 34.73 kg/m  Physical Exam Vitals and nursing note reviewed.  Constitutional:      Appearance: Normal appearance. She is well-developed.  HENT:     Head: Normocephalic.     Mouth/Throat:     Mouth: Mucous membranes are moist.  Eyes:     Pupils: Pupils are equal, round, and reactive to light.  Cardiovascular:     Rate and Rhythm: Normal rate.  Pulmonary:     Effort: Pulmonary effort is normal.  Abdominal:     General: There is no distension.  Musculoskeletal:        General: Tenderness present. No swelling.     Cervical back: Normal range of motion.     Comments: decreased range of motion right shoulder, pain with abduction full range of motion elbow and hand  Skin:    General: Skin is warm.  Neurological:     General: No focal deficit present.     Mental Status: She is alert and oriented to person, place, and time.  Psychiatric:        Mood and Affect: Mood normal.     ED Results / Procedures / Treatments   Labs (all labs ordered are listed, but only abnormal results are displayed) Labs Reviewed - No data to display  EKG None  Radiology No results found.  Procedures Procedures    Medications Ordered in ED Medications - No data to display  ED Course/ Medical Decision Making/ A&P                             Medical Decision Making Complains of discomfort in her shoulder.  Patient reports she has had the pain for a year.  Patient reports she was told that she had a fracture to her shoulder in the past.  Amount and/or Complexity of Data  Reviewed External Data Reviewed: notes.    Details: These ED notes reviewed Radiology: ordered and independent interpretation performed. Decision-making details documented in ED Course.    Details: X-ray right shoulder shows degenerative changes of patient's AC joint.  Risk OTC drugs.           Final Clinical Impression(s) / ED Diagnoses Final diagnoses:  Acute pain of right shoulder  Rx / DC Orders ED Discharge Orders          Ordered    diclofenac (VOLTAREN) 75 MG EC tablet  2 times daily        08/13/22 1906           An After Visit Summary was printed and given to the patient.    Osie Cheeks 08/13/22 1906    Mardene Sayer, MD 08/14/22 [phone]

## 2022-08-13 NOTE — ED Triage Notes (Signed)
Pt arrives with c/o right shoulder pain that started a year ago. Pt also endorses bilateral foot pain for a year.

## 2022-08-13 NOTE — Discharge Instructions (Addendum)
Return if any problems.

## 2022-09-08 ENCOUNTER — Encounter (HOSPITAL_COMMUNITY): Payer: Self-pay

## 2022-09-08 ENCOUNTER — Ambulatory Visit (HOSPITAL_COMMUNITY)
Admission: EM | Admit: 2022-09-08 | Discharge: 2022-09-08 | Disposition: A | Discharge status: Home / Self Care | Payer: Medicare HMO | Attending: Emergency Medicine | Admitting: Emergency Medicine

## 2022-09-08 DIAGNOSIS — N760 Acute vaginitis: Secondary | ICD-10-CM | POA: Insufficient documentation

## 2022-09-08 DIAGNOSIS — E119 Type 2 diabetes mellitus without complications: Secondary | ICD-10-CM | POA: Insufficient documentation

## 2022-09-08 DIAGNOSIS — K047 Periapical abscess without sinus: Secondary | ICD-10-CM | POA: Diagnosis present

## 2022-09-08 DIAGNOSIS — Z760 Encounter for issue of repeat prescription: Secondary | ICD-10-CM | POA: Insufficient documentation

## 2022-09-08 DIAGNOSIS — E1165 Type 2 diabetes mellitus with hyperglycemia: Secondary | ICD-10-CM | POA: Insufficient documentation

## 2022-09-08 DIAGNOSIS — Z794 Long term (current) use of insulin: Secondary | ICD-10-CM | POA: Diagnosis present

## 2022-09-08 MED ORDER — FLUCONAZOLE 150 MG PO TABS: 150.0000 mg | ORAL_TABLET | Freq: Once | ORAL | 1 refills | Status: AC

## 2022-09-08 MED ORDER — PENICILLIN V POTASSIUM 500 MG PO TABS: 500.0000 mg | ORAL_TABLET | Freq: Four times a day (QID) | ORAL | 0 refills | Status: AC

## 2022-09-08 MED ORDER — MICONAZOLE NITRATE 2 % EX CREA: 1.0000 | TOPICAL_CREAM | Freq: Two times a day (BID) | CUTANEOUS | 0 refills | Status: DC

## 2022-09-08 MED ORDER — VICTOZA 18 MG/3ML ~~LOC~~ SOPN
1.8000 mg | PEN_INJECTOR | Freq: Every day | SUBCUTANEOUS | 2 refills | Status: DC
Start: 2022-09-08 — End: 2022-11-21

## 2022-09-08 MED ORDER — INSULIN SYRINGES (DISPOSABLE) U-100 0.3 ML MISC: 1.0000 | Freq: Two times a day (BID) | 0 refills | Status: DC

## 2022-09-08 MED ORDER — CHLORHEXIDINE GLUCONATE 0.12 % MT SOLN: OROMUCOSAL | 0 refills | Status: DC

## 2022-09-08 MED ORDER — GLUCOSE BLOOD VI STRP
ORAL_STRIP | 1 refills | Status: DC
Start: 2022-09-08 — End: 2022-11-30

## 2022-09-08 MED ORDER — BLOOD GLUCOSE MONITOR KIT: PACK | 0 refills | Status: DC

## 2022-09-08 MED ORDER — INSULIN GLARGINE 100 UNIT/ML ~~LOC~~ SOLN
30.0000 [IU] | Freq: Two times a day (BID) | SUBCUTANEOUS | 3 refills | Status: DC
Start: 2022-09-08 — End: 2022-11-10

## 2022-09-08 MED ORDER — IBUPROFEN 600 MG PO TABS: 600.0000 mg | ORAL_TABLET | Freq: Four times a day (QID) | ORAL | 0 refills | Status: DC | PRN

## 2022-09-08 NOTE — ED Provider Notes (Signed)
HPI  SUBJECTIVE:  Evelyn Watts is a 47 y.o. female who presents with multiple issues today.  First, she reports vaginal itching, irritation primarily after urination/wiping for the past 2 weeks.  No discharge, odor, rash, abdominal, urinary complaints.  She states that she has not been sexually active for 5 to 6 years.  She tried 2 doses of Diflucan with improvement in her symptoms.  Symptoms are worse with wiping/urination.  She states that her glucose has been running in the 200s, but ran out of insulin 2 days ago.  Second, she was reporting right upper dental pain/abscess, sensitive to air/temperature for the past 3 to 4 days at a broken tooth.  No fevers, facial swelling, gingival swelling, purulent drainage.  She has tried Orajel and NSAIDs with improvement in her symptoms.  Symptoms worse with exposure to air, p.o. intake.  Third, she states that she needs an insulin refill on her Lantus and Victoza.  She takes 30 units of Lantus twice daily and 1.8 units of Victoza as needed.  She has a past medical history of diabetes, yeast infections.  States that she is on lisinopril for "kidney protection", and denies a history of hypertension.  No history of BV, trichomonas.  PCP: None.  Patient was seen in the ED on 4/20 with a boil in her axilla, vaginal itching, was sent home with 3 doses of Diflucan and insulin.  Glucose was 403.  Past Medical History:  Diagnosis Date   Allergies    Asthma    Diabetes mellitus without complication (HCC)    Essential hypertension     Past Surgical History:  Procedure Laterality Date   CESAREAN SECTION     4   CHOLECYSTECTOMY     HERNIA REPAIR     TONSILLECTOMY      Family History  Problem Relation Age of Onset   Healthy Mother    Healthy Father    Hypertension Neg Hx    Diabetes Neg Hx     Social History   Tobacco Use   Smoking status: Never   Smokeless tobacco: Never  Vaping Use   Vaping Use: Never used  Substance Use Topics    Alcohol use: No   Drug use: No    No current facility-administered medications for this encounter.  Current Outpatient Medications:    chlorhexidine (PERIDEX) 0.12 % solution, 15 mL swish and spit bid, Disp: 480 mL, Rfl: 0   ibuprofen (ADVIL) 600 MG tablet, Take 1 tablet (600 mg total) by mouth every 6 (six) hours as needed., Disp: 30 tablet, Rfl: 0   miconazole (MICOTIN) 2 % cream, Apply 1 Application topically 2 (two) times daily., Disp: 28.35 g, Rfl: 0   penicillin v potassium (VEETID) 500 MG tablet, Take 1 tablet (500 mg total) by mouth 4 (four) times daily for 7 days., Disp: 28 tablet, Rfl: 0   albuterol (VENTOLIN HFA) 108 (90 Base) MCG/ACT inhaler, Inhale 2 puffs into the lungs every 4 (four) hours as needed for wheezing., Disp: 1 each, Rfl: 0   blood glucose meter kit and supplies KIT, Dispense based on patient and insurance preference. Use up to four times daily as directed. (FOR ICD-9 250.00, 250.01)., Disp: 1 each, Rfl: 0   cetirizine (ZYRTEC) 10 MG tablet, Take 1 tablet (10 mg total) by mouth daily., Disp: 30 tablet, Rfl: 0   gabapentin (NEURONTIN) 100 MG capsule, Take 1 capsule (100 mg total) by mouth 3 (three) times daily., Disp: 90 capsule, Rfl: 0  glucose blood test strip, Use as instructed. Check blood glucose levels by fingerstick twice per day, Disp: 100 each, Rfl: 1   insulin glargine (LANTUS) 100 UNIT/ML injection, Inject 0.3 mLs (30 Units total) into the skin 2 (two) times daily., Disp: 10 mL, Rfl: 3   Insulin Syringes, Disposable, U-100 0.3 ML MISC, 1 Dose by Does not apply route every 12 (twelve) hours., Disp: 100 each, Rfl: 0   liraglutide (VICTOZA) 18 MG/3ML SOPN, Inject 1.8 mg into the skin daily., Disp: 9 mL, Rfl: 2   lisinopril (PRINIVIL,ZESTRIL) 20 MG tablet, Take 1 tablet (20 mg total) by mouth daily., Disp: 90 tablet, Rfl: 0   metFORMIN (GLUCOPHAGE) 500 MG tablet, Take 2 tablets (1,000 mg total) by mouth 2 (two) times daily with a meal., Disp: 60 tablet, Rfl: 1    Misc. Devices MISC, Please provide patient with glucerna for nutritional support., Disp: 48 each, Rfl: 3  Allergies  Allergen Reactions   Latex Rash   Morphine And Codeine Rash   Tape Rash     ROS  As noted in HPI.   Physical Exam  BP (!) 133/91 (BP Location: Left Arm)   Pulse 94   Temp 98.3 F (36.8 C) (Oral)   Resp 18   SpO2 98%   Constitutional: Well developed, well nourished, no acute distress Eyes:  EOMI, conjunctiva normal bilaterally HENT: Normocephalic, atraumatic,mucus membranes moist.  Right upper second molar broken, tender to palpation with gingival erythema and swelling.  No expressible purulent drainage.  No facial swelling.  No trismus. Respiratory: Normal inspiratory effort Cardiovascular: Normal rate GI: nondistended skin: No rash, skin intact Musculoskeletal: no deformities Neurologic: Alert & oriented x 3, no focal neuro deficits Psychiatric: Speech and behavior appropriate   ED Course   Medications - No data to display  Orders Placed This Encounter  Procedures   Nursing Communication Please set up with a PCP prior to discharge    Please set up with a PCP prior to discharge    Standing Status:   Standing    Number of Occurrences:   1    No results found for this or any previous visit (from the past 24 hour(s)). No results found.  ED Clinical Impression  1. Vaginitis and vulvovaginitis   2. Dental infection   3. Diabetes mellitus type 2, insulin dependent (HCC)   4. Medication refill   5. Type 2 diabetes mellitus with hyperglycemia, with long-term current use of insulin [facility]) Chronic     ED Assessment/Plan     ER records reviewed.  Noted in HPI.  1.  Vaginal itching/irritation.  Suspect yeast infection from uncontrolled diabetes.  Patient denies possibility being pregnant or being sexually active in 5 to 6 years.  Will check swab for BV and yeast.  Do not think STD testing necessary.  Will send home with Diflucan 2 doses, external  fungal cream.  2.  Dental infection-patient with early dental infection.  No evidence of buccal space abscess.  home with Tylenol/ibuprofen, PCN V, Peridex/Listerine, salt water rinses.  Dental list.  ER return precautions given.  3.  Uncontrolled diabetes with medication refill.  Will refill Lantus, Victoza, print out prescription for glucometer, insulin syringes, and strips.  Will have staff set patient up with a PCP prior to discharge.  Discussed labs,  MDM, treatment plan, and plan for follow-up with patient. Discussed sn/sx that should prompt return to the ED. patient agrees with plan.   Meds ordered this encounter  Medications  blood glucose meter kit and supplies KIT    Sig: Dispense based on patient and insurance preference. Use up to four times daily as directed. (FOR ICD-9 250.00, 250.01).    Dispense:  1 each    Refill:  0    Order Specific Question:   Number of strips    Answer:   120    Order Specific Question:   Number of lancets    Answer:   120   glucose blood test strip    Sig: Use as instructed. Check blood glucose levels by fingerstick twice per day    Dispense:  100 each    Refill:  1   insulin glargine (LANTUS) 100 UNIT/ML injection    Sig: Inject 0.3 mLs (30 Units total) into the skin 2 (two) times daily.    Dispense:  10 mL    Refill:  3   liraglutide (VICTOZA) 18 MG/3ML SOPN    Sig: Inject 1.8 mg into the skin daily.    Dispense:  9 mL    Refill:  2   chlorhexidine (PERIDEX) 0.12 % solution    Sig: 15 mL swish and spit bid    Dispense:  480 mL    Refill:  0   ibuprofen (ADVIL) 600 MG tablet    Sig: Take 1 tablet (600 mg total) by mouth every 6 (six) hours as needed.    Dispense:  30 tablet    Refill:  0   penicillin v potassium (VEETID) 500 MG tablet    Sig: Take 1 tablet (500 mg total) by mouth 4 (four) times daily for 7 days.    Dispense:  28 tablet    Refill:  0   fluconazole (DIFLUCAN) 150 MG tablet    Sig: Take 1 tablet (150 mg total) by  mouth once for 1 dose. 1 tab po x 1. May repeat in 72 hours if no improvement    Dispense:  2 tablet    Refill:  1   miconazole (MICOTIN) 2 % cream    Sig: Apply 1 Application topically 2 (two) times daily.    Dispense:  28.35 g    Refill:  0   DISCONTD: Insulin Syringes, Disposable, U-100 0.3 ML MISC    Sig: 1 Dose by Does not apply route every 12 (twelve) hours.    Dispense:  100 each    Refill:  0   Insulin Syringes, Disposable, U-100 0.3 ML MISC    Sig: 1 Dose by Does not apply route every 12 (twelve) hours.    Dispense:  100 each    Refill:  0      *This clinic note was created using Scientist, clinical (histocompatibility and immunogenetics). Therefore, there may be occasional mistakes despite careful proofreading.  ?   Domenick Gong, MD 09/09/22 1018

## 2022-09-08 NOTE — Discharge Instructions (Signed)
Finish the Diflucan, even if you feel better.  You can try the miconazole external cream for irritation.  I have refilled your Victoza and Lantus.  It is important to keep your glucose under good control to prevent further yeast infections.  We will contact you if your wet prep comes back positive for bacterial vaginosis we will prescribe the appropriate medication.  Take 600 mg of ibuprofen, 1000 mg of Tylenol 3 times a day as needed for pain.  Peridex or Listerine mouth rinses and salt water rinses after you eat.  Finish the penicillin.  Follow-up with a dentist on the dental list that I gave you ASAP.

## 2022-09-08 NOTE — ED Triage Notes (Signed)
Pt c/o rt upper broken tooth with an abscess x3 days.   Pt c/o vaginal irritation for over a month. States took diflucan with no relief.

## 2022-09-09 LAB — CERVICOVAGINAL ANCILLARY ONLY
Bacterial Vaginitis (gardnerella): NEGATIVE
Candida Glabrata: POSITIVE — AB
Candida Vaginitis: POSITIVE — AB
Comment: NEGATIVE
Comment: NEGATIVE
Comment: NEGATIVE

## 2022-09-28 ENCOUNTER — Encounter: Payer: Medicare HMO | Admitting: Family

## 2022-09-28 NOTE — Progress Notes (Signed)
  This encounter was created in error - please disregard. No show 

## 2022-11-10 ENCOUNTER — Ambulatory Visit (HOSPITAL_COMMUNITY)
Admission: EM | Admit: 2022-11-10 | Discharge: 2022-11-10 | Disposition: A | Discharge status: Home / Self Care | Payer: Medicare HMO | Attending: Family Medicine | Admitting: Family Medicine

## 2022-11-10 ENCOUNTER — Encounter (HOSPITAL_COMMUNITY): Payer: Self-pay

## 2022-11-10 DIAGNOSIS — Z299 Encounter for prophylactic measures, unspecified: Secondary | ICD-10-CM

## 2022-11-10 DIAGNOSIS — K047 Periapical abscess without sinus: Secondary | ICD-10-CM | POA: Diagnosis not present

## 2022-11-10 DIAGNOSIS — K0889 Other specified disorders of teeth and supporting structures: Secondary | ICD-10-CM

## 2022-11-10 DIAGNOSIS — Z794 Long term (current) use of insulin: Secondary | ICD-10-CM

## 2022-11-10 DIAGNOSIS — E1165 Type 2 diabetes mellitus with hyperglycemia: Secondary | ICD-10-CM

## 2022-11-10 DIAGNOSIS — N760 Acute vaginitis: Secondary | ICD-10-CM

## 2022-11-10 LAB — POCT FASTING CBG KUC MANUAL ENTRY: POCT Glucose (KUC): 282 mg/dL — AB (ref 70–99)

## 2022-11-10 MED ORDER — LISINOPRIL 20 MG PO TABS: 20.0000 mg | ORAL_TABLET | Freq: Every day | ORAL | 0 refills | Status: DC

## 2022-11-10 MED ORDER — AMOXICILLIN 875 MG PO TABS: 875.0000 mg | ORAL_TABLET | Freq: Two times a day (BID) | ORAL | 0 refills | Status: DC

## 2022-11-10 MED ORDER — INSULIN GLARGINE 100 UNIT/ML ~~LOC~~ SOLN
30.0000 [IU] | Freq: Two times a day (BID) | SUBCUTANEOUS | 3 refills | Status: DC
Start: 2022-11-10 — End: 2023-03-08

## 2022-11-10 MED ORDER — CETIRIZINE HCL 10 MG PO TABS: 10.0000 mg | ORAL_TABLET | Freq: Every day | ORAL | 0 refills | Status: DC

## 2022-11-10 MED ORDER — INSULIN SYRINGES (DISPOSABLE) U-100 0.3 ML MISC: 1.0000 | Freq: Two times a day (BID) | 0 refills | Status: DC

## 2022-11-10 MED ORDER — FLUCONAZOLE 150 MG PO TABS: 150.0000 mg | ORAL_TABLET | ORAL | 0 refills | Status: DC | PRN

## 2022-11-10 MED ORDER — METFORMIN HCL 500 MG PO TABS
1000.0000 mg | ORAL_TABLET | Freq: Two times a day (BID) | ORAL | 0 refills | Status: DC
Start: 2022-11-10 — End: 2023-04-09

## 2022-11-10 MED ORDER — GABAPENTIN 100 MG PO CAPS: 100.0000 mg | ORAL_CAPSULE | Freq: Three times a day (TID) | ORAL | 0 refills | Status: DC

## 2022-11-10 MED ORDER — IBUPROFEN 600 MG PO TABS: 600.0000 mg | ORAL_TABLET | Freq: Two times a day (BID) | ORAL | 0 refills | Status: DC | PRN

## 2022-11-10 NOTE — ED Provider Notes (Signed)
[facility]    CSN: [id number] Arrival date & time: 11/10/22  0802       History   Chief Complaint Chief Complaint  Patient presents with   Foot Pain   Vaginal Pain   Dental Pain    HPI Evelyn Watts is a 47 y.o. female.   HPI Patient presents for evaluation of foot pain, vaginitis, and dental pain. Patient suffers from diabetes and tells this Clinical research associate she currently has not seen a PCP in several months. She is out of all medications. Patient reports she was previously receiving in home primary care services through [facility] and the provider stopped coming out to see her and refill medications. Since that time she has been coming here to urgent care to receive mediation refills. Her last known A1C 11.5 and recent in clinic readings have been consistently > 300. Patient has had recurrent vaginitis symptoms since glucose has been uncontrolled.   She shows this Clinical research associate her insurance card which shows [facility] as her primary care office/ she reports never reaching out to this office to establish care. Patient is open to allowing our nursing staff schedule patient with a primary care provider. She is also requesting treatment for a dental infection. This is a reoccurring problem and patient is presently trying to  establish with a provider.  Past Medical History:  Diagnosis Date   Allergies    Asthma    Diabetes mellitus without complication [facility])    Essential hypertension     Patient Active Problem List   Diagnosis Date Noted   Essential hypertension 01/20/2020   COVID-19 virus infection 01/20/2020   Type 2 diabetes mellitus with hyperglycemia, with long-term current use of insulin (HCC) 02/14/2018   Diabetes mellitus without complication (HCC) 08/24/2017   Moderate episode of recurrent major depressive disorder (HCC) 05/24/2016   Morbid obesity (HCC) 05/24/2016    Past Surgical History:  Procedure Laterality Date   CESAREAN SECTION     4    CHOLECYSTECTOMY     HERNIA REPAIR     TONSILLECTOMY      OB History   No obstetric history on file.      Home Medications    Prior to Admission medications   Medication Sig Start Date End Date Taking? Authorizing Provider  albuterol (VENTOLIN HFA) 108 (90 Base) MCG/ACT inhaler Inhale 2 puffs into the lungs every 4 (four) hours as needed for wheezing. 02/12/20  Yes Bast, Traci A, NP  amoxicillin (AMOXIL) 875 MG tablet Take 1 tablet (875 mg total) by mouth 2 (two) times daily. 11/10/22  Yes Bing Neighbors, NP  fluconazole (DIFLUCAN) 150 MG tablet Take 1 tablet (150 mg total) by mouth every three (3) days as needed. Repeat if needed 11/10/22  Yes Bing Neighbors, NP  liraglutide (VICTOZA) 18 MG/3ML SOPN Inject 1.8 mg into the skin daily. 09/08/22 12/07/22 Yes Domenick Gong, MD  blood glucose meter kit and supplies KIT Dispense based on patient and insurance preference. Use up to four times daily as directed. (FOR ICD-9 250.00, 250.01). 09/08/22   Domenick Gong, MD  cetirizine (ZYRTEC) 10 MG tablet Take 1 tablet (10 mg total) by mouth daily. 11/10/22   Bing Neighbors, NP  chlorhexidine (PERIDEX) 0.12 % solution 15 mL swish and spit bid 09/08/22   Domenick Gong, MD  gabapentin (NEURONTIN) 100 MG capsule Take 1 capsule (100 mg total) by mouth 3 (three) times daily. 11/10/22   Bing Neighbors, NP  glucose blood  test strip Use as instructed. Check blood glucose levels by fingerstick twice per day 09/08/22   Domenick Gong, MD  ibuprofen (ADVIL) 600 MG tablet Take 1 tablet (600 mg total) by mouth 2 (two) times daily as needed. 11/10/22   Bing Neighbors, NP  insulin glargine (LANTUS) 100 UNIT/ML injection Inject 0.3 mLs (30 Units total) into the skin 2 (two) times daily. 11/10/22   Bing Neighbors, NP  Insulin Syringes, Disposable, U-100 0.3 ML MISC 1 Dose by Does not apply route every 12 (twelve) hours. 11/10/22   Bing Neighbors, NP  lisinopril (ZESTRIL) 20 MG tablet  Take 1 tablet (20 mg total) by mouth daily. 11/10/22   Bing Neighbors, NP  metFORMIN (GLUCOPHAGE) 500 MG tablet Take 2 tablets (1,000 mg total) by mouth 2 (two) times daily with a meal. 11/10/22 02/08/23  Bing Neighbors, NP  miconazole (MICOTIN) 2 % cream Apply 1 Application topically 2 (two) times daily. 09/08/22   Domenick Gong, MD  Misc. Devices MISC Please provide patient with glucerna for nutritional support. 02/14/18   Claiborne Rigg, NP  atorvastatin (LIPITOR) 20 MG tablet Take 1 tablet (20 mg total) by mouth daily. Patient not taking: Reported on 02/09/2019 02/14/18 02/12/20  Claiborne Rigg, NP    Family History Family History  Problem Relation Age of Onset   Healthy Mother    Healthy Father    Hypertension Neg Hx    Diabetes Neg Hx     Social History Social History   Tobacco Use   Smoking status: Never   Smokeless tobacco: Never  Vaping Use   Vaping status: Never Used  Substance Use Topics   Alcohol use: No   Drug use: No     Allergies   Latex, Morphine and codeine, and Tape   Review of Systems Review of Systems Pertinent negatives listed in HPI  Physical Exam Triage Vital Signs ED Triage Vitals  Encounter Vitals Group     BP 11/10/22 0825 (!) 137/92     Systolic BP Percentile --      Diastolic BP Percentile --      Pulse Rate 11/10/22 0825 94     Resp 11/10/22 0825 18     Temp 11/10/22 0825 98 F (36.7 C)     Temp Source 11/10/22 0825 Oral     SpO2 11/10/22 0825 98 %     Weight 11/10/22 0824 254 lb (115.2 kg)     Height 11/10/22 0824 5\' 11"  (1.803 m)     Head Circumference --      Peak Flow --      Pain Score 11/10/22 0822 10     Pain Loc --      Pain Education --      Exclude from Growth Chart --    No data found.  Updated Vital Signs BP (!) 137/92 (BP Location: Left Wrist)   Pulse 94   Temp 98 F (36.7 C) (Oral)   Resp 18   Ht 5\' 11"  (1.803 m)   Wt 254 lb (115.2 kg)   LMP 10/22/2022 (Approximate)   SpO2 98%   BMI 35.43  kg/m   Visual Acuity Right Eye Distance:   Left Eye Distance:   Bilateral Distance:    Right Eye Near:   Left Eye Near:    Bilateral Near:     Physical Exam Vitals reviewed.  Constitutional:      Appearance: She is obese. She is not toxic-appearing.  HENT:  Head: Normocephalic and atraumatic.     Nose: No congestion or rhinorrhea.     Mouth/Throat:     Lips: Pink.     Mouth: Mucous membranes are moist.     Dentition: Abnormal dentition. Dental caries present.  Eyes:     Extraocular Movements: Extraocular movements intact.     Pupils: Pupils are equal, round, and reactive to light.  Cardiovascular:     Rate and Rhythm: Normal rate and regular rhythm.  Pulmonary:     Effort: Pulmonary effort is normal.     Breath sounds: Normal breath sounds.  Musculoskeletal:     Cervical back: Normal range of motion and neck supple.  Skin:    General: Skin is warm and dry.     Capillary Refill: Capillary refill takes less than 2 seconds.  Neurological:     General: No focal deficit present.     Mental Status: She is alert.      UC Treatments / Results  Labs (all labs ordered are listed, but only abnormal results are displayed) Labs Reviewed  POCT FASTING CBG [facility] MANUAL ENTRY - Abnormal; Notable for the following components:      Result Value   POCT Glucose ([facility]) 282 (*)    All other components within normal limits    EKG   Radiology No results found.  Procedures Procedures (including critical care time)  Medications Ordered in UC Medications - No data to display  Initial Impression / Assessment and Plan / UC Course  I have reviewed the triage vital signs and the nursing notes.  Pertinent labs & imaging results that were available during my care of the patient were reviewed by me and considered in my medical decision making (see chart for details).    Patient scheduled with a primary care provider.  Dental infection, Amoxicillin. Refilled medications   Hyperglycemia, T2DM advised to continuously take medication and administer insulin as prescribed. Diflucan 150 mg today and administer 2nd dose in 3 days as needed. Return as needed. Advised to keep new patient appointment with primary care. Final Clinical Impressions(s) / UC Diagnoses   Final diagnoses:  Pain, dental  Dental infection  Type 2 diabetes mellitus with hyperglycemia, with long-term current use of insulin (HCC)  Vaginitis and vulvovaginitis  Preventive medication therapy needed     Discharge Instructions      Our office will not be able to continue to refill your medication as you need to establish with a primary care provider.  We have set you up an appointment for August 28, please keep that appointment if you are unable to make the appointment reschedule at the [facility].     ED Prescriptions     Medication Sig Dispense Auth. Provider   gabapentin (NEURONTIN) 100 MG capsule  (Status: Discontinued) Take 1 capsule (100 mg total) by mouth 3 (three) times daily. 90 capsule Bing Neighbors, NP   lisinopril (ZESTRIL) 20 MG tablet Take 1 tablet (20 mg total) by mouth daily. 90 tablet Bing Neighbors, NP   metFORMIN (GLUCOPHAGE) 500 MG tablet Take 2 tablets (1,000 mg total) by mouth 2 (two) times daily with a meal. 360 tablet Bing Neighbors, NP   cetirizine (ZYRTEC) 10 MG tablet Take 1 tablet (10 mg total) by mouth daily. 30 tablet Bing Neighbors, NP   fluconazole (DIFLUCAN) 150 MG tablet Take 1 tablet (150 mg total) by mouth every three (3) days as needed. Repeat if needed 3 tablet Joaquin Courts  S, NP   insulin glargine (LANTUS) 100 UNIT/ML injection Inject 0.3 mLs (30 Units total) into the skin 2 (two) times daily. 10 mL Bing Neighbors, NP   Insulin Syringes, Disposable, U-100 0.3 ML MISC 1 Dose by Does not apply route every 12 (twelve) hours. 100 each Bing Neighbors, NP   ibuprofen (ADVIL) 600 MG tablet Take 1 tablet (600 mg total) by  mouth 2 (two) times daily as needed. 30 tablet Bing Neighbors, NP   amoxicillin (AMOXIL) 875 MG tablet Take 1 tablet (875 mg total) by mouth 2 (two) times daily. 20 tablet Bing Neighbors, NP   gabapentin (NEURONTIN) 100 MG capsule Take 1 capsule (100 mg total) by mouth 3 (three) times daily. 90 capsule Bing Neighbors, NP      PDMP not reviewed this encounter.   Bing Neighbors, NP 11/15/22 2106

## 2022-11-10 NOTE — Discharge Instructions (Addendum)
Our office will not be able to continue to refill your medication as you need to establish with a primary care provider.  We have set you up an appointment for August 28, please keep that appointment if you are unable to make the appointment reschedule at the [facility].

## 2022-11-10 NOTE — ED Triage Notes (Signed)
Dental pain with broken teeth and an abscess in upper left side of he mouth. Onset a few months ago.   Bilateral foot pain for years. Has history of bone spurs and hammer toes. Having numbness now. Pain worse for a long time now.    Vaginal pain for a few days. Patient having slight pain with urination no discharge. Patient  is diabetic and out of her meds for a long time.

## 2022-11-21 ENCOUNTER — Encounter (HOSPITAL_COMMUNITY): Payer: Self-pay

## 2022-11-21 ENCOUNTER — Other Ambulatory Visit: Payer: Self-pay

## 2022-11-21 ENCOUNTER — Emergency Department (HOSPITAL_COMMUNITY)
Admission: EM | Admit: 2022-11-21 | Discharge: 2022-11-21 | Disposition: A | Discharge status: Home / Self Care | Payer: Medicare HMO | Attending: Emergency Medicine | Admitting: Emergency Medicine

## 2022-11-21 DIAGNOSIS — Z7984 Long term (current) use of oral hypoglycemic drugs: Secondary | ICD-10-CM | POA: Insufficient documentation

## 2022-11-21 DIAGNOSIS — B379 Candidiasis, unspecified: Secondary | ICD-10-CM

## 2022-11-21 DIAGNOSIS — Z91040 Latex allergy status: Secondary | ICD-10-CM | POA: Insufficient documentation

## 2022-11-21 DIAGNOSIS — G629 Polyneuropathy, unspecified: Secondary | ICD-10-CM

## 2022-11-21 DIAGNOSIS — B3731 Acute candidiasis of vulva and vagina: Secondary | ICD-10-CM | POA: Diagnosis not present

## 2022-11-21 DIAGNOSIS — E1140 Type 2 diabetes mellitus with diabetic neuropathy, unspecified: Secondary | ICD-10-CM | POA: Diagnosis not present

## 2022-11-21 DIAGNOSIS — Z760 Encounter for issue of repeat prescription: Secondary | ICD-10-CM | POA: Diagnosis not present

## 2022-11-21 DIAGNOSIS — Z794 Long term (current) use of insulin: Secondary | ICD-10-CM | POA: Diagnosis not present

## 2022-11-21 DIAGNOSIS — M79672 Pain in left foot: Secondary | ICD-10-CM | POA: Diagnosis present

## 2022-11-21 DIAGNOSIS — E1165 Type 2 diabetes mellitus with hyperglycemia: Secondary | ICD-10-CM

## 2022-11-21 MED ORDER — NYSTATIN 100000 UNIT/GM EX CREA: TOPICAL_CREAM | CUTANEOUS | 0 refills | Status: DC

## 2022-11-21 MED ORDER — FLUCONAZOLE 150 MG PO TABS: 150.0000 mg | ORAL_TABLET | Freq: Every day | ORAL | 0 refills | Status: AC

## 2022-11-21 MED ORDER — LIRAGLUTIDE 18 MG/3ML ~~LOC~~ SOPN
1.8000 mg | PEN_INJECTOR | Freq: Every day | SUBCUTANEOUS | 2 refills | Status: DC
Start: 2022-11-21 — End: 2023-03-08

## 2022-11-21 MED ORDER — GABAPENTIN 300 MG PO CAPS: 300.0000 mg | ORAL_CAPSULE | Freq: Three times a day (TID) | ORAL | 0 refills | Status: DC | PRN

## 2022-11-21 NOTE — ED Triage Notes (Signed)
Pt arrived POV reporting ble foot pain for years. Patient also states vaginal lacerations, using cream prescribed, not getting better. Patient is also diabetic and out of her meds for a long time and states would like prescription

## 2022-11-21 NOTE — Discharge Instructions (Addendum)
Please follow-up in the outpatient setting for all of his medical conditions.  Please note that medication refills, and chronic medical conditions, and non-life-threatening conditions should be managed in the outpatient office.

## 2022-11-21 NOTE — ED Provider Notes (Signed)
[place] EMERGENCY DEPARTMENT AT [facility] Provider Note   CSN: [id number] Arrival date & time: 11/21/22  1337     History  Chief Complaint  Patient presents with   Foot Pain   Laceration   Shoulder Pain    R side    Evelyn Watts is a 47 y.o. female presented to ED with multiple complaints.  Patient is a history of diabetes.  She reports she is on her Victoza but she still has metformin.  She reports she has bilateral leg heaviness in the left foot greater than the right and she was diagnosed with neuropathy and trying gabapentin 100 mg 3 times daily with little relief.  She also reports that she has concerns for yeast infection of both the vagina and the skin folds outside her vagina.  She says she has a new PCP appointment coming up in 1 month but she needs temporary refill of her medication so she can get there.  HPI     Home Medications Prior to Admission medications   Medication Sig Start Date End Date Taking? Authorizing Provider  albuterol (VENTOLIN HFA) 108 (90 Base) MCG/ACT inhaler Inhale 2 puffs into the lungs every 4 (four) hours as needed for wheezing. 02/12/20   Dahlia Byes A, NP  amoxicillin (AMOXIL) 875 MG tablet Take 1 tablet (875 mg total) by mouth 2 (two) times daily. 11/10/22   Bing Neighbors, NP  blood glucose meter kit and supplies KIT Dispense based on patient and insurance preference. Use up to four times daily as directed. (FOR ICD-9 250.00, 250.01). 09/08/22   Domenick Gong, MD  cetirizine (ZYRTEC) 10 MG tablet Take 1 tablet (10 mg total) by mouth daily. 11/10/22   Bing Neighbors, NP  chlorhexidine (PERIDEX) 0.12 % solution 15 mL swish and spit bid 09/08/22   Domenick Gong, MD  fluconazole (DIFLUCAN) 150 MG tablet Take 1 tablet (150 mg total) by mouth every three (3) days as needed. Repeat if needed 11/10/22   Bing Neighbors, NP  gabapentin (NEURONTIN) 100 MG capsule Take 1 capsule (100 mg total) by mouth 3 (three) times  daily. 11/10/22   Bing Neighbors, NP  glucose blood test strip Use as instructed. Check blood glucose levels by fingerstick twice per day 09/08/22   Domenick Gong, MD  ibuprofen (ADVIL) 600 MG tablet Take 1 tablet (600 mg total) by mouth 2 (two) times daily as needed. 11/10/22   Bing Neighbors, NP  insulin glargine (LANTUS) 100 UNIT/ML injection Inject 0.3 mLs (30 Units total) into the skin 2 (two) times daily. 11/10/22   Bing Neighbors, NP  Insulin Syringes, Disposable, U-100 0.3 ML MISC 1 Dose by Does not apply route every 12 (twelve) hours. 11/10/22   Bing Neighbors, NP  liraglutide (VICTOZA) 18 MG/3ML SOPN Inject 1.8 mg into the skin daily. 09/08/22 12/07/22  Domenick Gong, MD  lisinopril (ZESTRIL) 20 MG tablet Take 1 tablet (20 mg total) by mouth daily. 11/10/22   Bing Neighbors, NP  metFORMIN (GLUCOPHAGE) 500 MG tablet Take 2 tablets (1,000 mg total) by mouth 2 (two) times daily with a meal. 11/10/22 02/08/23  Bing Neighbors, NP  miconazole (MICOTIN) 2 % cream Apply 1 Application topically 2 (two) times daily. 09/08/22   Domenick Gong, MD  Misc. Devices MISC Please provide patient with glucerna for nutritional support. 02/14/18   Claiborne Rigg, NP  atorvastatin (LIPITOR) 20 MG tablet Take 1 tablet (20 mg total) by mouth daily.  Patient not taking: Reported on 02/09/2019 02/14/18 02/12/20  Claiborne Rigg, NP      Allergies    Latex, Morphine and codeine, and Tape    Review of Systems   Review of Systems  Physical Exam Updated Vital Signs BP 124/88 (BP Location: Left Arm)   Pulse 97   Temp 97.9 F (36.6 C) (Oral)   Resp 18   Ht 5\' 11"  (1.803 m)   Wt 115.2 kg   LMP 10/22/2022 (Approximate)   SpO2 96%   BMI 35.42 kg/m  Physical Exam Constitutional:      General: She is not in acute distress.    Appearance: She is obese.  HENT:     Head: Normocephalic and atraumatic.  Eyes:     Conjunctiva/sclera: Conjunctivae normal.     Pupils: Pupils are  equal, round, and reactive to light.  Cardiovascular:     Rate and Rhythm: Normal rate and regular rhythm.  Pulmonary:     Effort: Pulmonary effort is normal. No respiratory distress.  Abdominal:     General: There is no distension.     Tenderness: There is no abdominal tenderness.  Genitourinary:    Comments: External GU exam performed with female chaperone present.  Patient has a mild amount of excoriation of the skin of the inguinal folds, near the vagina, with a minor amount of skin breakdown Skin:    General: Skin is warm and dry.  Neurological:     General: No focal deficit present.     Mental Status: She is alert. Mental status is at baseline.  Psychiatric:        Mood and Affect: Mood normal.        Behavior: Behavior normal.     ED Results / Procedures / Treatments   Labs (all labs ordered are listed, but only abnormal results are displayed) Labs Reviewed - No data to display  EKG None  Radiology No results found.  Procedures Procedures    Medications Ordered in ED Medications - No data to display  ED Course/ Medical Decision Making/ A&P                             Medical Decision Making  Patient is here with multiple complaints, all of which are chronic.  I suspect she has peripheral neuropathy in her feet.  Do not see evidence of infection or indication for x-ray imaging.  She is able to ambulate here in the ED.  We will give her temporary gabapentin dose increase, so she can see her PCP next month for a new patient visit.  She is also requesting Victoza prescription, which I will provide, as well as treatment for suspected yeast infection.  I made adamantly clear to the patient that these conditions to be managed as an outpatient moving forward.  The ER should not function for chronic medication refills, and this should be handled by a PCP's office.  She verbalized understanding.        Final Clinical Impression(s) / ED Diagnoses Final diagnoses:   Yeast infection  Neuropathy  Medication refill    Rx / DC Orders ED Discharge Orders     None         Terald Sleeper, MD 11/21/22 1431

## 2022-11-30 ENCOUNTER — Encounter: Payer: Self-pay | Admitting: Nurse Practitioner

## 2022-11-30 ENCOUNTER — Ambulatory Visit (INDEPENDENT_AMBULATORY_CARE_PROVIDER_SITE_OTHER): Payer: Medicare HMO | Admitting: Nurse Practitioner

## 2022-11-30 VITALS — BP 126/80 | HR 94 | Temp 98.5°F | Resp 14 | Ht 71.0 in | Wt 256.2 lb

## 2022-11-30 DIAGNOSIS — M25511 Pain in right shoulder: Secondary | ICD-10-CM | POA: Diagnosis not present

## 2022-11-30 DIAGNOSIS — I10 Essential (primary) hypertension: Secondary | ICD-10-CM | POA: Diagnosis not present

## 2022-11-30 DIAGNOSIS — Z1212 Encounter for screening for malignant neoplasm of rectum: Secondary | ICD-10-CM

## 2022-11-30 DIAGNOSIS — G8929 Other chronic pain: Secondary | ICD-10-CM

## 2022-11-30 DIAGNOSIS — Z13220 Encounter for screening for lipoid disorders: Secondary | ICD-10-CM

## 2022-11-30 DIAGNOSIS — E1165 Type 2 diabetes mellitus with hyperglycemia: Secondary | ICD-10-CM | POA: Diagnosis not present

## 2022-11-30 DIAGNOSIS — Z794 Long term (current) use of insulin: Secondary | ICD-10-CM | POA: Diagnosis not present

## 2022-11-30 DIAGNOSIS — Z1211 Encounter for screening for malignant neoplasm of colon: Secondary | ICD-10-CM

## 2022-11-30 LAB — POCT GLYCOSYLATED HEMOGLOBIN (HGB A1C): HbA1c, POC (controlled diabetic range): 12.7 % — AB (ref 0.0–7.0)

## 2022-11-30 MED ORDER — GLUCOSE BLOOD VI STRP
ORAL_STRIP | 1 refills | Status: DC
Start: 2022-11-30 — End: 2023-04-09

## 2022-11-30 MED ORDER — BLOOD GLUCOSE TEST VI STRP: 1.0000 | ORAL_STRIP | Freq: Three times a day (TID) | 0 refills | Status: AC

## 2022-11-30 MED ORDER — BLOOD GLUCOSE MONITORING SUPPL DEVI: 1.0000 | Freq: Three times a day (TID) | 0 refills | Status: DC

## 2022-11-30 MED ORDER — LANCETS MISC. MISC: 1.0000 | Freq: Three times a day (TID) | 0 refills | Status: AC

## 2022-11-30 MED ORDER — LANCET DEVICE MISC: 1.0000 | Freq: Three times a day (TID) | 0 refills | Status: AC

## 2022-11-30 MED ORDER — MELOXICAM 7.5 MG PO TABS
7.5000 mg | ORAL_TABLET | Freq: Every day | ORAL | 0 refills | Status: DC
Start: 2022-11-30 — End: 2023-03-12

## 2022-11-30 NOTE — Progress Notes (Signed)
@Patient  ID: Evelyn Watts, female    DOB: 1975/12/03, 47 y.o.   MRN: [id number]  Chief Complaint  Patient presents with   Diabetes    Referring provider: No ref. provider found   HPI  47 year old female with history of hypertension, diabetes, depression, obesity.  Patient presents today to establish care.  She states that she has not had a PCP in the last few years.  She has has been going to the urgent care where she needs refills on her diabetic medications.  Her A1c is over 12 today.  She says that she was out of her diabetic medications for a while.  She is now back on all of her medications we will place a referral for pharmacy for medication management.  We will order glucometer.  Will place referral for nutritionist.  Patient is due for colon cancer screening.  We will order Cologuard.  Patient is due for diabetic eye exam.  We will place referral for eye doctor.  Patient does also complain today of chronic right shoulder pain.  Previous imaging does show degenerative disease.  We will order Mobic.  We discussed the patient may need a referral to Ortho if the pain continues.  Denies f/c/s, n/v/d, hemoptysis, PND, leg swelling Denies chest pain or edema     Allergies  Allergen Reactions   Latex Rash   Morphine And Codeine Rash   Tape Rash    Immunization History  Administered Date(s) Administered   Influenza,inj,Quad PF,6+ Mos 02/14/2018    Past Medical History:  Diagnosis Date   Allergies    Asthma    Diabetes mellitus without complication (HCC)    Essential hypertension     Tobacco History: Social History   Tobacco Use  Smoking Status Never  Smokeless Tobacco Never   Counseling given: Not Answered   Outpatient Encounter Medications as of 11/30/2022  Medication Sig   albuterol (VENTOLIN HFA) 108 (90 Base) MCG/ACT inhaler Inhale 2 puffs into the lungs every 4 (four) hours as needed for wheezing.   blood glucose meter kit and supplies KIT Dispense based  on patient and insurance preference. Use up to four times daily as directed. (FOR ICD-9 250.00, 250.01).   Blood Glucose Monitoring Suppl DEVI 1 each by Does not apply route in the morning, at noon, and at bedtime. May substitute to any manufacturer covered by patient's insurance.   cetirizine (ZYRTEC) 10 MG tablet Take 1 tablet (10 mg total) by mouth daily.   fluconazole (DIFLUCAN) 150 MG tablet Take 1 tablet (150 mg total) by mouth every three (3) days as needed. Repeat if needed   gabapentin (NEURONTIN) 100 MG capsule Take 1 capsule (100 mg total) by mouth 3 (three) times daily.   gabapentin (NEURONTIN) 300 MG capsule Take 1 capsule (300 mg total) by mouth 3 (three) times daily as needed for up to 21 doses.   Glucose Blood (BLOOD GLUCOSE TEST STRIPS) STRP 1 each by In Vitro route in the morning, at noon, and at bedtime. May substitute to any manufacturer covered by patient's insurance.   ibuprofen (ADVIL) 600 MG tablet Take 1 tablet (600 mg total) by mouth 2 (two) times daily as needed.   insulin glargine (LANTUS) 100 UNIT/ML injection Inject 0.3 mLs (30 Units total) into the skin 2 (two) times daily.   Insulin Syringes, Disposable, U-100 0.3 ML MISC 1 Dose by Does not apply route every 12 (twelve) hours.   Lancet Device MISC 1 each by Does not apply route in  the morning, at noon, and at bedtime. May substitute to any manufacturer covered by patient's insurance.   Lancets Misc. MISC 1 each by Does not apply route in the morning, at noon, and at bedtime. May substitute to any manufacturer covered by patient's insurance.   liraglutide (VICTOZA) 18 MG/3ML SOPN Inject 1.8 mg into the skin daily.   lisinopril (ZESTRIL) 20 MG tablet Take 1 tablet (20 mg total) by mouth daily.   meloxicam (MOBIC) 7.5 MG tablet Take 1 tablet (7.5 mg total) by mouth daily.   metFORMIN (GLUCOPHAGE) 500 MG tablet Take 2 tablets (1,000 mg total) by mouth 2 (two) times daily with a meal.   amoxicillin (AMOXIL) 875 MG tablet  Take 1 tablet (875 mg total) by mouth 2 (two) times daily. (Patient not taking: Reported on 11/30/2022)   chlorhexidine (PERIDEX) 0.12 % solution 15 mL swish and spit bid (Patient not taking: Reported on 11/30/2022)   glucose blood test strip Use as instructed. Check blood glucose levels by fingerstick twice per day   miconazole (MICOTIN) 2 % cream Apply 1 Application topically 2 (two) times daily. (Patient not taking: Reported on 11/30/2022)   Misc. Devices MISC Please provide patient with glucerna for nutritional support. (Patient not taking: Reported on 11/30/2022)   nystatin cream (MYCOSTATIN) Apply to affected area 2 times daily for 10 days to the skin OUTSIDE of the vagina (Patient not taking: Reported on 11/30/2022)   [DISCONTINUED] atorvastatin (LIPITOR) 20 MG tablet Take 1 tablet (20 mg total) by mouth daily. (Patient not taking: Reported on 02/09/2019)   [DISCONTINUED] glucose blood test strip Use as instructed. Check blood glucose levels by fingerstick twice per day (Patient not taking: Reported on 11/30/2022)   No facility-administered encounter medications on file as of 11/30/2022.     Review of Systems  Review of Systems  Constitutional: Negative.   HENT: Negative.    Cardiovascular: Negative.   Gastrointestinal: Negative.   Allergic/Immunologic: Negative.   Neurological: Negative.   Psychiatric/Behavioral: Negative.         Physical Exam  BP 126/80 (BP Location: Right Arm, Patient Position: Sitting, Cuff Size: Large)   Pulse 94   Temp 98.5 F (36.9 C)   Resp 14   Ht 5\' 11"  (1.803 m)   Wt 256 lb 3.2 oz (116.2 kg)   LMP 11/15/2022 (Approximate)   SpO2 99%   BMI 35.73 kg/m   Wt Readings from Last 5 Encounters:  11/30/22 256 lb 3.2 oz (116.2 kg)  11/21/22 253 lb 15.5 oz (115.2 kg)  11/10/22 254 lb (115.2 kg)  08/13/22 249 lb (112.9 kg)  07/15/22 249 lb 1.9 oz (113 kg)     Physical Exam Vitals and nursing note reviewed.  Constitutional:      General: She is not in  acute distress.    Appearance: She is well-developed.  Cardiovascular:     Rate and Rhythm: Normal rate and regular rhythm.  Pulmonary:     Effort: Pulmonary effort is normal.     Breath sounds: Normal breath sounds.  Neurological:     Mental Status: She is alert and oriented to person, place, and time.      Lab Results:  CBC    Component Value Date/Time   WBC 9.0 07/28/2022 1720   RBC 4.49 07/28/2022 1720   HGB 10.7 (L) 07/28/2022 1720   HCT 33.2 (L) 07/28/2022 1720   PLT 353 07/28/2022 1720   MCV 73.9 (L) 07/28/2022 1720   MCH 23.8 (L) 07/28/2022 1720  MCHC 32.2 07/28/2022 1720   RDW 15.3 07/28/2022 1720   LYMPHSABS 1.8 07/28/2022 1720   MONOABS 0.5 07/28/2022 1720   EOSABS 0.2 07/28/2022 1720   BASOSABS 0.1 07/28/2022 1720    BMET    Component Value Date/Time   NA 129 (L) 07/28/2022 1720   NA 137 02/14/2018 1425   K 4.0 07/28/2022 1720   CL 96 (L) 07/28/2022 1720   CO2 24 07/28/2022 1720   GLUCOSE 403 (H) 07/28/2022 1720   BUN 16 07/28/2022 1720   BUN 10 02/14/2018 1425   CREATININE 0.63 07/28/2022 1720   CALCIUM 9.0 07/28/2022 1720   GFRNONAA >60 07/28/2022 1720   GFRAA >60 01/19/2020 2119     Assessment & Plan:   Type 2 diabetes mellitus with hyperglycemia, with long-term current use of insulin (HCC) - POCT glycosylated hemoglobin (Hb A1C) - Microalbumin/Creatinine Ratio, Urine - Cologuard - Ambulatory referral to Ophthalmology - AMB Referral to Pharmacy Medication Management - CBC - Comprehensive metabolic panel - glucose blood test strip; Use as instructed. Check blood glucose levels by fingerstick twice per day  Dispense: 100 each; Refill: 1  2. Lipid screening  - Lipid Panel  3. Essential hypertension  - AMB Referral to Pharmacy Medication Management   4. Chronic right shoulder pain  - meloxicam (MOBIC) 7.5 MG tablet; Take 1 tablet (7.5 mg total) by mouth daily.  Dispense: 30 tablet; Refill: 0   Follow up:  Follow up in 3  months     Ivonne Andrew, NP 11/30/2022

## 2022-11-30 NOTE — Assessment & Plan Note (Signed)
-   POCT glycosylated hemoglobin (Hb A1C) - Microalbumin/Creatinine Ratio, Urine - Cologuard - Ambulatory referral to Ophthalmology - AMB Referral to Pharmacy Medication Management - CBC - Comprehensive metabolic panel - glucose blood test strip; Use as instructed. Check blood glucose levels by fingerstick twice per day  Dispense: 100 each; Refill: 1  2. Lipid screening  - Lipid Panel  3. Essential hypertension  - AMB Referral to Pharmacy Medication Management   4. Chronic right shoulder pain  - meloxicam (MOBIC) 7.5 MG tablet; Take 1 tablet (7.5 mg total) by mouth daily.  Dispense: 30 tablet; Refill: 0   Follow up:  Follow up in 3 months

## 2022-11-30 NOTE — Patient Instructions (Addendum)
1. Type 2 diabetes mellitus with hyperglycemia, with long-term current use of insulin (HCC)  - POCT glycosylated hemoglobin (Hb A1C) - Microalbumin/Creatinine Ratio, Urine - Cologuard - Ambulatory referral to Ophthalmology - AMB Referral to Pharmacy Medication Management - CBC - Comprehensive metabolic panel - glucose blood test strip; Use as instructed. Check blood glucose levels by fingerstick twice per day  Dispense: 100 each; Refill: 1  2. Lipid screening  - Lipid Panel  3. Essential hypertension  - AMB Referral to Pharmacy Medication Management   4. Chronic right shoulder pain  - meloxicam (MOBIC) 7.5 MG tablet; Take 1 tablet (7.5 mg total) by mouth daily.  Dispense: 30 tablet; Refill: 0   Follow up:  Follow up in 3 months

## 2022-11-30 NOTE — Progress Notes (Signed)
Pt is here to est care  Complaining of issues with her feet, pain to same No injury to same per pt    Right Shoulder pain for the past several years, no injury to same per pt   Requesting RX for glucose meter and supplies for same

## 2022-12-01 ENCOUNTER — Other Ambulatory Visit: Payer: Self-pay | Admitting: Nurse Practitioner

## 2022-12-01 MED ORDER — FERROUS SULFATE 90 (18 FE) MG PO TABS: 1.0000 | ORAL_TABLET | Freq: Every day | ORAL | 0 refills | Status: DC

## 2022-12-01 MED ORDER — FOLIC ACID 800 MCG PO TABS: 400.0000 ug | ORAL_TABLET | Freq: Every day | ORAL | 0 refills | Status: DC

## 2022-12-01 MED ORDER — ROSUVASTATIN CALCIUM 20 MG PO TABS: 20.0000 mg | ORAL_TABLET | Freq: Every day | ORAL | 11 refills | Status: DC

## 2022-12-02 ENCOUNTER — Telehealth: Payer: Self-pay

## 2022-12-02 NOTE — Progress Notes (Signed)
   Care Guide Note  12/02/2022 Name: Evelyn Watts MRN: [id number] DOB: 1976/04/13  Referred by: Pcp, No Reason for referral : Care Management (Outreach to schedule with Pharm d )   Evelyn Watts is a 47 y.o. year old female who is a primary care patient of Pcp, No. Evelyn Watts was referred to the pharmacist for assistance related to DM.    An unsuccessful telephone outreach was attempted today to contact the patient who was referred to the pharmacy team for assistance with medication management. Additional attempts will be made to contact the patient.   [name], RMA Care Guide [facility]  [place], [place] [address] Direct Dial: [phone] [email or web address]

## 2022-12-02 NOTE — Progress Notes (Signed)
   Care Guide Note  12/02/2022 Name: Kawaii Goldinger MRN: [id number] DOB: 1975/07/31  Referred by: Pcp, No Reason for referral : Care Management (Outreach to schedule with Pharm d )   Bitha Keplar is a 47 y.o. year old female who is a primary care patient of Pcp, No. Zanae Reiten was referred to the pharmacist for assistance related to DM.    Successful contact was made with the patient to discuss pharmacy services including being ready for the pharmacist to call at least 5 minutes before the scheduled appointment time, to have medication bottles and any blood sugar or blood pressure readings ready for review. The patient agreed to meet with the pharmacist via with the pharmacist via telephone visit on (date/time).  01/10/2023  [name], RMA Care Guide [facility]  [place], [place] [address] Direct Dial: [phone] [email or web address]

## 2022-12-16 NOTE — Addendum Note (Signed)
Addended by: Jama Flavors on: 12/16/2022 12:16 PM   Modules accepted: Orders

## 2022-12-19 ENCOUNTER — Ambulatory Visit: Payer: Medicare HMO | Admitting: Nurse Practitioner

## 2023-01-02 ENCOUNTER — Emergency Department (HOSPITAL_COMMUNITY): Payer: Medicare HMO

## 2023-01-02 ENCOUNTER — Other Ambulatory Visit: Payer: Self-pay

## 2023-01-02 ENCOUNTER — Emergency Department (HOSPITAL_COMMUNITY)
Admission: EM | Admit: 2023-01-02 | Discharge: 2023-01-03 | Disposition: A | Discharge status: Home / Self Care | Payer: Medicare HMO | Attending: Emergency Medicine | Admitting: Emergency Medicine

## 2023-01-02 DIAGNOSIS — M542 Cervicalgia: Secondary | ICD-10-CM | POA: Diagnosis not present

## 2023-01-02 DIAGNOSIS — M25521 Pain in right elbow: Secondary | ICD-10-CM | POA: Insufficient documentation

## 2023-01-02 DIAGNOSIS — M25541 Pain in joints of right hand: Secondary | ICD-10-CM | POA: Insufficient documentation

## 2023-01-02 DIAGNOSIS — E119 Type 2 diabetes mellitus without complications: Secondary | ICD-10-CM | POA: Insufficient documentation

## 2023-01-02 DIAGNOSIS — Z79899 Other long term (current) drug therapy: Secondary | ICD-10-CM | POA: Diagnosis not present

## 2023-01-02 DIAGNOSIS — M79631 Pain in right forearm: Secondary | ICD-10-CM | POA: Diagnosis not present

## 2023-01-02 DIAGNOSIS — S46812A Strain of other muscles, fascia and tendons at shoulder and upper arm level, left arm, initial encounter: Secondary | ICD-10-CM | POA: Diagnosis not present

## 2023-01-02 DIAGNOSIS — M549 Dorsalgia, unspecified: Secondary | ICD-10-CM | POA: Diagnosis not present

## 2023-01-02 DIAGNOSIS — Z7984 Long term (current) use of oral hypoglycemic drugs: Secondary | ICD-10-CM | POA: Insufficient documentation

## 2023-01-02 DIAGNOSIS — Y92410 Unspecified street and highway as the place of occurrence of the external cause: Secondary | ICD-10-CM | POA: Diagnosis not present

## 2023-01-02 DIAGNOSIS — M79601 Pain in right arm: Secondary | ICD-10-CM | POA: Diagnosis present

## 2023-01-02 DIAGNOSIS — Z6835 Body mass index (BMI) 35.0-35.9, adult: Secondary | ICD-10-CM | POA: Diagnosis not present

## 2023-01-02 DIAGNOSIS — Z91040 Latex allergy status: Secondary | ICD-10-CM | POA: Insufficient documentation

## 2023-01-02 DIAGNOSIS — I10 Essential (primary) hypertension: Secondary | ICD-10-CM | POA: Diagnosis not present

## 2023-01-02 DIAGNOSIS — S29012A Strain of muscle and tendon of back wall of thorax, initial encounter: Secondary | ICD-10-CM | POA: Diagnosis not present

## 2023-01-02 DIAGNOSIS — Z794 Long term (current) use of insulin: Secondary | ICD-10-CM | POA: Insufficient documentation

## 2023-01-02 DIAGNOSIS — E669 Obesity, unspecified: Secondary | ICD-10-CM | POA: Insufficient documentation

## 2023-01-02 DIAGNOSIS — S0990XA Unspecified injury of head, initial encounter: Secondary | ICD-10-CM | POA: Diagnosis present

## 2023-01-02 DIAGNOSIS — Z5321 Procedure and treatment not carried out due to patient leaving prior to being seen by health care provider: Secondary | ICD-10-CM | POA: Insufficient documentation

## 2023-01-02 MED ORDER — HYDROCODONE-ACETAMINOPHEN 5-325 MG PO TABS: 1.0000 | ORAL_TABLET | Freq: Once | ORAL | Status: DC

## 2023-01-02 MED ORDER — IBUPROFEN 800 MG PO TABS: 800.0000 mg | ORAL_TABLET | Freq: Once | ORAL | Status: DC
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
## 2023-01-03 ENCOUNTER — Emergency Department (HOSPITAL_COMMUNITY)
Admission: EM | Admit: 2023-01-03 | Discharge: 2023-01-03 | Disposition: A | Discharge status: Home / Self Care | Payer: Medicare HMO | Attending: Emergency Medicine | Admitting: Emergency Medicine

## 2023-01-03 ENCOUNTER — Encounter (HOSPITAL_COMMUNITY): Payer: Self-pay | Admitting: Emergency Medicine

## 2023-01-03 DIAGNOSIS — Z79899 Other long term (current) drug therapy: Secondary | ICD-10-CM | POA: Insufficient documentation

## 2023-01-03 DIAGNOSIS — I10 Essential (primary) hypertension: Secondary | ICD-10-CM | POA: Insufficient documentation

## 2023-01-03 DIAGNOSIS — S46812A Strain of other muscles, fascia and tendons at shoulder and upper arm level, left arm, initial encounter: Secondary | ICD-10-CM | POA: Insufficient documentation

## 2023-01-03 DIAGNOSIS — Z7984 Long term (current) use of oral hypoglycemic drugs: Secondary | ICD-10-CM | POA: Insufficient documentation

## 2023-01-03 DIAGNOSIS — Z794 Long term (current) use of insulin: Secondary | ICD-10-CM | POA: Insufficient documentation

## 2023-01-03 DIAGNOSIS — E119 Type 2 diabetes mellitus without complications: Secondary | ICD-10-CM | POA: Insufficient documentation

## 2023-01-03 DIAGNOSIS — Z91040 Latex allergy status: Secondary | ICD-10-CM | POA: Insufficient documentation

## 2023-01-03 DIAGNOSIS — Y92410 Unspecified street and highway as the place of occurrence of the external cause: Secondary | ICD-10-CM | POA: Insufficient documentation

## 2023-01-03 DIAGNOSIS — M79601 Pain in right arm: Secondary | ICD-10-CM | POA: Insufficient documentation

## 2023-01-03 MED ORDER — KETOROLAC TROMETHAMINE 15 MG/ML IJ SOLN
30.0000 mg | Freq: Once | INTRAMUSCULAR | Status: AC
  Administered 2023-01-03: 30 mg via INTRAMUSCULAR
  Filled 2023-01-03: qty 2

## 2023-01-03 MED ORDER — METHOCARBAMOL 500 MG PO TABS
500.0000 mg | ORAL_TABLET | Freq: Two times a day (BID) | ORAL | 0 refills | Status: DC | PRN
Start: 2023-01-03 — End: 2023-03-02

## 2023-01-03 MED ORDER — LIDOCAINE 5 % EX PTCH
1.0000 | MEDICATED_PATCH | CUTANEOUS | Status: DC
  Administered 2023-01-03: 1 via TRANSDERMAL
  Filled 2023-01-03: qty 1

## 2023-01-03 MED ORDER — IBUPROFEN 600 MG PO TABS: 600.0000 mg | ORAL_TABLET | Freq: Three times a day (TID) | ORAL | 0 refills | Status: DC | PRN

## 2023-01-03 MED ORDER — KETOROLAC TROMETHAMINE 15 MG/ML IJ SOLN: 15.0000 mg | Freq: Once | INTRAMUSCULAR | Status: DC
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
## 2023-01-03 NOTE — ED Notes (Signed)
[facility] called and said pt was in their lobby to be registered
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
## 2023-01-10 ENCOUNTER — Telehealth: Payer: Self-pay | Admitting: Pharmacist

## 2023-01-10 ENCOUNTER — Other Ambulatory Visit: Payer: Medicare HMO | Admitting: Pharmacist

## 2023-01-10 NOTE — Progress Notes (Unsigned)
Attempted to contact patient for scheduled appointment for medication management. Left HIPAA compliant message for patient to return my call at their convenience.   Will attempt outreach again this afternoon.   Catie Eppie Gibson, PharmD, BCACP, CPP Clinical Pharmacist [facility] [phone]

## 2023-01-11 NOTE — Telephone Encounter (Signed)
Attempted call again today. Call could not be completed on either listed cell or home number.   Catie Eppie Gibson, PharmD, BCACP, CPP Clinical Pharmacist [facility] [phone]

## 2023-01-12 ENCOUNTER — Telehealth: Payer: Self-pay

## 2023-01-12 NOTE — Progress Notes (Signed)
Care Guide Note  01/12/2023 Name: Evelyn Watts MRN: [id number] DOB: 22-Jan-1976  Referred by: Pcp, No Reason for referral : Care Coordination (Outreach to reschedule with Pharm d )   [name] is a 47 y.o. year old female who is a primary care patient of Pcp, No. Marionna Gutierres was referred to the pharmacist for assistance related to DM.    An unsuccessful telephone outreach was attempted today to contact the patient who was referred to the pharmacy team for assistance with medication management. Additional attempts will be made to contact the patient.   [name], RMA Care Guide [facility]  [place], [place] [address] Direct Dial: [phone] [email or web address]

## 2023-01-18 NOTE — Progress Notes (Signed)
Care Guide Note  01/18/2023 Name: Evelyn Watts MRN: [id number] DOB: 07/24/1975  Referred by: Pcp, No Reason for referral : Care Coordination (Outreach to reschedule with Pharm d )   Evelyn Watts is a 47 y.o. year old female who is a primary care patient of Pcp, No. Evelyn Watts was referred to the pharmacist for assistance related to DM.    A second unsuccessful telephone outreach was attempted today to contact the patient who was referred to the pharmacy team for assistance with medication management. Additional attempts will be made to contact the patient.  [name], RMA Care Guide [name]  [address], [place] [address] Direct Dial: [phone] [email or web address]

## 2023-01-24 NOTE — Progress Notes (Signed)
  Care Coordination Note  01/24/2023 Name: Evelyn Watts MRN: [id number] DOB: Dec 09, 1975  Evelyn Watts is a 47 y.o. year old female who is a primary care patient of Pcp, No and is actively engaged with the Chronic Care Management team. I reached out to Vonzell Schlatter by phone today to assist with re-scheduling an initial visit with the Pharmacist  Follow up plan: Unable to make contact on outreach attempts x 3. Pharm d Catie Clearance Coots has been  notified via routed documentation in medical record.  [name], RMA Care Guide [facility]  [place], [place] [address] Direct Dial: [phone] [email or web address]

## 2023-02-08 ENCOUNTER — Encounter: Payer: Self-pay | Admitting: Nurse Practitioner

## 2023-02-08 ENCOUNTER — Ambulatory Visit (INDEPENDENT_AMBULATORY_CARE_PROVIDER_SITE_OTHER): Payer: Medicare HMO | Admitting: Nurse Practitioner

## 2023-02-08 VITALS — BP 142/89 | HR 86 | Temp 98.6°F | Resp 14 | Ht 71.0 in | Wt 255.0 lb

## 2023-02-08 DIAGNOSIS — M79671 Pain in right foot: Secondary | ICD-10-CM

## 2023-02-08 DIAGNOSIS — K047 Periapical abscess without sinus: Secondary | ICD-10-CM

## 2023-02-08 DIAGNOSIS — M79672 Pain in left foot: Secondary | ICD-10-CM | POA: Diagnosis not present

## 2023-02-08 DIAGNOSIS — R413 Other amnesia: Secondary | ICD-10-CM

## 2023-02-08 MED ORDER — AMOXICILLIN 875 MG PO TABS
875.0000 mg | ORAL_TABLET | Freq: Two times a day (BID) | ORAL | 0 refills | Status: AC
Start: 2023-02-08 — End: 2023-02-18

## 2023-02-08 MED ORDER — AMOXICILLIN 875 MG PO TABS
875.0000 mg | ORAL_TABLET | Freq: Two times a day (BID) | ORAL | 0 refills | Status: DC
Start: 2023-02-08 — End: 2023-02-08

## 2023-02-08 MED ORDER — IBUPROFEN 600 MG PO TABS: 600.0000 mg | ORAL_TABLET | Freq: Three times a day (TID) | ORAL | 0 refills | Status: DC | PRN

## 2023-02-08 MED ORDER — FLUCONAZOLE 150 MG PO TABS: 150.0000 mg | ORAL_TABLET | Freq: Every day | ORAL | 0 refills | Status: DC

## 2023-02-08 NOTE — Progress Notes (Signed)
Pt is here for follow up   Requesting refills on her medications   Requesting paperwork filled out for home health aid   Complaining of tooth pain on the right side, broken tooth, possible infection requesting antibiotic for same   Complaining of vaginal soreness and irritation for X1 week

## 2023-02-08 NOTE — Patient Instructions (Signed)
1. Bilateral foot pain  - Ambulatory referral to Podiatry  2. Memory loss  - Ambulatory referral to Neurology  3. Abscessed tooth  - amoxicillin (AMOXIL) 875 MG tablet; Take 1 tablet (875 mg total) by mouth 2 (two) times daily for 10 days.  Dispense: 20 tablet; Refill: 0   Follow up:  Follow up in 1 month

## 2023-02-08 NOTE — Progress Notes (Signed)
Subjective   Patient ID: Evelyn Watts, female    DOB: Aug 31, 1975, 47 y.o.   MRN: [id number]  Chief Complaint  Patient presents with   Follow-up    Referring provider: No ref. provider found  Evelyn Watts is a 47 y.o. female with Past Medical History: No date: Allergies No date: Asthma No date: Diabetes mellitus without complication (HCC) No date: Essential hypertension   HPI  Patient presents today for an acute visit.  She states that she is having tooth pain on her upper right side.  She states that she does have a tooth that is cracked.  We did give her numbers to local dentists who do take her insurance.  She does need to call make an appointment with them.  We will trial amoxicillin and ibuprofen for now.  Patient also has paperwork that she needs filled out for her neighbor to be her home health aide.  She states that she does have a lot of pain in her feet and has hammertoes.  She would like a referral to podiatry because she is having difficulty walking at this point.  She also states that she has been having ongoing memory loss.  We will place a referral for her to podiatry for foot pain and to neurology for evaluation of memory loss. Denies f/c/s, n/v/d, hemoptysis, PND, leg swelling Denies chest pain or edema      Allergies  Allergen Reactions   Latex Rash   Morphine And Codeine Rash   Tape Rash    Immunization History  Administered Date(s) Administered   Influenza,inj,Quad PF,6+ Mos 02/14/2018    Tobacco History: Social History   Tobacco Use  Smoking Status Never  Smokeless Tobacco Never   Counseling given: Not Answered   Outpatient Encounter Medications as of 02/08/2023  Medication Sig   albuterol (VENTOLIN HFA) 108 (90 Base) MCG/ACT inhaler Inhale 2 puffs into the lungs every 4 (four) hours as needed for wheezing.   blood glucose meter kit and supplies KIT Dispense based on patient and insurance preference. Use up to four times daily as  directed. (FOR ICD-9 250.00, 250.01).   Blood Glucose Monitoring Suppl DEVI 1 each by Does not apply route in the morning, at noon, and at bedtime. May substitute to any manufacturer covered by patient's insurance.   cetirizine (ZYRTEC) 10 MG tablet Take 1 tablet (10 mg total) by mouth daily.   Ferrous Sulfate 90 (18 Fe) MG TABS Take 1 tablet by mouth daily.   fluconazole (DIFLUCAN) 150 MG tablet Take 1 tablet (150 mg total) by mouth daily.   folic acid (FOLVITE) 800 MCG tablet Take 0.5 tablets (400 mcg total) by mouth daily.   gabapentin (NEURONTIN) 100 MG capsule Take 1 capsule (100 mg total) by mouth 3 (three) times daily.   gabapentin (NEURONTIN) 300 MG capsule Take 1 capsule (300 mg total) by mouth 3 (three) times daily as needed for up to 21 doses.   glucose blood test strip Use as instructed. Check blood glucose levels by fingerstick twice per day   insulin glargine (LANTUS) 100 UNIT/ML injection Inject 0.3 mLs (30 Units total) into the skin 2 (two) times daily.   Insulin Syringes, Disposable, U-100 0.3 ML MISC 1 Dose by Does not apply route every 12 (twelve) hours.   liraglutide (VICTOZA) 18 MG/3ML SOPN Inject 1.8 mg into the skin daily.   lisinopril (ZESTRIL) 20 MG tablet Take 1 tablet (20 mg total) by mouth daily.   meloxicam (MOBIC) 7.5 MG tablet  Take 1 tablet (7.5 mg total) by mouth daily.   metFORMIN (GLUCOPHAGE) 500 MG tablet Take 2 tablets (1,000 mg total) by mouth 2 (two) times daily with a meal.   methocarbamol (ROBAXIN) 500 MG tablet Take 1 tablet (500 mg total) by mouth every 12 (twelve) hours as needed for muscle spasms.   rosuvastatin (CRESTOR) 20 MG tablet Take 1 tablet (20 mg total) by mouth daily.   [DISCONTINUED] amoxicillin (AMOXIL) 875 MG tablet Take 1 tablet (875 mg total) by mouth 2 (two) times daily for 10 days.   [DISCONTINUED] fluconazole (DIFLUCAN) 150 MG tablet Take 1 tablet (150 mg total) by mouth every three (3) days as needed. Repeat if needed   [DISCONTINUED]  ibuprofen (ADVIL) 600 MG tablet Take 1 tablet (600 mg total) by mouth every 8 (eight) hours as needed for mild pain or moderate pain.   amoxicillin (AMOXIL) 875 MG tablet Take 1 tablet (875 mg total) by mouth 2 (two) times daily for 10 days.   ibuprofen (ADVIL) 600 MG tablet Take 1 tablet (600 mg total) by mouth every 8 (eight) hours as needed for mild pain (pain score 1-3) or moderate pain (pain score 4-6).   [DISCONTINUED] atorvastatin (LIPITOR) 20 MG tablet Take 1 tablet (20 mg total) by mouth daily. (Patient not taking: Reported on 02/09/2019)   No facility-administered encounter medications on file as of 02/08/2023.    Review of Systems  Review of Systems  Constitutional: Negative.   HENT: Negative.    Cardiovascular: Negative.   Gastrointestinal: Negative.   Musculoskeletal:        Bilateral foot pain  Allergic/Immunologic: Negative.   Neurological: Negative.   Psychiatric/Behavioral: Negative.       Objective:   BP (!) 142/89 (BP Location: Right Arm, Patient Position: Sitting, Cuff Size: Large)   Pulse 86   Temp 98.6 F (37 C)   Resp 14   Ht 5\' 11"  (1.803 m)   Wt 255 lb (115.7 kg)   SpO2 100%   BMI 35.57 kg/m   Wt Readings from Last 5 Encounters:  02/08/23 255 lb (115.7 kg)  01/02/23 255 lb 11.7 oz (116 kg)  11/30/22 256 lb 3.2 oz (116.2 kg)  11/21/22 253 lb 15.5 oz (115.2 kg)  11/10/22 254 lb (115.2 kg)     Physical Exam Vitals and nursing note reviewed.  Constitutional:      General: She is not in acute distress.    Appearance: She is well-developed.  Cardiovascular:     Rate and Rhythm: Normal rate and regular rhythm.  Pulmonary:     Effort: Pulmonary effort is normal.     Breath sounds: Normal breath sounds.  Neurological:     Mental Status: She is alert and oriented to person, place, and time.       Assessment & Plan:   Bilateral foot pain -     Ambulatory referral to Podiatry  Memory loss -     Ambulatory referral to  Neurology  Abscessed tooth -     Amoxicillin; Take 1 tablet (875 mg total) by mouth 2 (two) times daily for 10 days.  Dispense: 20 tablet; Refill: 0  Other orders -     Fluconazole; Take 1 tablet (150 mg total) by mouth daily.  Dispense: 1 tablet; Refill: 0 -     Ibuprofen; Take 1 tablet (600 mg total) by mouth every 8 (eight) hours as needed for mild pain (pain score 1-3) or moderate pain (pain score 4-6).  Dispense: 30  tablet; Refill: 0     Return in about 4 weeks (around 03/08/2023) for diabetes.   Ivonne Andrew, NP 02/08/2023

## 2023-03-02 ENCOUNTER — Ambulatory Visit: Payer: Self-pay | Admitting: Nurse Practitioner

## 2023-03-02 ENCOUNTER — Encounter (HOSPITAL_COMMUNITY): Payer: Self-pay | Admitting: *Deleted

## 2023-03-02 ENCOUNTER — Ambulatory Visit (HOSPITAL_COMMUNITY)
Admission: EM | Admit: 2023-03-02 | Discharge: 2023-03-02 | Disposition: A | Discharge status: Home / Self Care | Payer: Medicare HMO | Attending: Family Medicine | Admitting: Family Medicine

## 2023-03-02 DIAGNOSIS — L03012 Cellulitis of left finger: Secondary | ICD-10-CM | POA: Diagnosis not present

## 2023-03-02 MED ORDER — IBUPROFEN 600 MG PO TABS: 600.0000 mg | ORAL_TABLET | Freq: Four times a day (QID) | ORAL | 0 refills | Status: DC | PRN

## 2023-03-02 MED ORDER — AMOXICILLIN-POT CLAVULANATE 875-125 MG PO TABS: 1.0000 | ORAL_TABLET | Freq: Two times a day (BID) | ORAL | 0 refills | Status: DC

## 2023-03-02 NOTE — ED Triage Notes (Signed)
Pt states she started having left hand pain. Pts left middle finger is swollen and has some discharge on the finger tip.

## 2023-03-02 NOTE — Discharge Instructions (Signed)
You were seen today for a paronychia of your finger.  This was opened and drained today.  I have sent out an antibiotic and motrin for pain.  Please keep the area covered while draining. You may also use warm epson salt soaks.  Avoid putting your fingers in your mouth or biting your nails.

## 2023-03-02 NOTE — ED Provider Notes (Signed)
[facility]    CSN: [id number] Arrival date & time: 03/02/23  1115      History   Chief Complaint Chief Complaint  Patient presents with   Hand Pain    HPI Evelyn Watts is a 47 y.o. female.    Hand Pain  Patient is here for left middle finger pain x 1 week.  At the distal end of the finger, and now the whole finger and hand is starting to swell.   There is a blister noted at the end of the finger.        Past Medical History:  Diagnosis Date   Allergies    Asthma    Diabetes mellitus without complication [facility])    Essential hypertension     Patient Active Problem List   Diagnosis Date Noted   Essential hypertension 01/20/2020   COVID-19 virus infection 01/20/2020   Type 2 diabetes mellitus with hyperglycemia, with long-term current use of insulin (HCC) 02/14/2018   Diabetes mellitus without complication (HCC) 08/24/2017   Moderate episode of recurrent major depressive disorder (HCC) 05/24/2016   Morbid obesity (HCC) 05/24/2016    Past Surgical History:  Procedure Laterality Date   CESAREAN SECTION     4   CHOLECYSTECTOMY     HERNIA REPAIR     TONSILLECTOMY      OB History   No obstetric history on file.      Home Medications    Prior to Admission medications   Medication Sig Start Date End Date Taking? Authorizing Provider  insulin glargine (LANTUS) 100 UNIT/ML injection Inject 0.3 mLs (30 Units total) into the skin 2 (two) times daily. 11/10/22  Yes Bing Neighbors, NP  liraglutide (VICTOZA) 18 MG/3ML SOPN Inject 1.8 mg into the skin daily. 11/21/22 03/02/23 Yes Trifan, Kermit Balo, MD  metFORMIN (GLUCOPHAGE) 500 MG tablet Take 2 tablets (1,000 mg total) by mouth 2 (two) times daily with a meal. 11/10/22 03/02/23 Yes Bing Neighbors, NP  albuterol (VENTOLIN HFA) 108 (90 Base) MCG/ACT inhaler Inhale 2 puffs into the lungs every 4 (four) hours as needed for wheezing. 02/12/20   Dahlia Byes A, NP  blood glucose meter kit and supplies  KIT Dispense based on patient and insurance preference. Use up to four times daily as directed. (FOR ICD-9 250.00, 250.01). 09/08/22   Domenick Gong, MD  Blood Glucose Monitoring Suppl DEVI 1 each by Does not apply route in the morning, at noon, and at bedtime. May substitute to any manufacturer covered by patient's insurance. 11/30/22   Ivonne Andrew, NP  cetirizine (ZYRTEC) 10 MG tablet Take 1 tablet (10 mg total) by mouth daily. 11/10/22   Bing Neighbors, NP  Ferrous Sulfate 90 (18 Fe) MG TABS Take 1 tablet by mouth daily. 12/01/22   Ivonne Andrew, NP  fluconazole (DIFLUCAN) 150 MG tablet Take 1 tablet (150 mg total) by mouth daily. 02/08/23   Ivonne Andrew, NP  folic acid (FOLVITE) 800 MCG tablet Take 0.5 tablets (400 mcg total) by mouth daily. 12/01/22   Ivonne Andrew, NP  gabapentin (NEURONTIN) 100 MG capsule Take 1 capsule (100 mg total) by mouth 3 (three) times daily. 11/10/22   Bing Neighbors, NP  gabapentin (NEURONTIN) 300 MG capsule Take 1 capsule (300 mg total) by mouth 3 (three) times daily as needed for up to 21 doses. 11/21/22   Terald Sleeper, MD  glucose blood test strip Use as instructed. Check blood glucose levels by fingerstick  twice per day 11/30/22   Ivonne Andrew, NP  ibuprofen (ADVIL) 600 MG tablet Take 1 tablet (600 mg total) by mouth every 8 (eight) hours as needed for mild pain (pain score 1-3) or moderate pain (pain score 4-6). 02/08/23   Ivonne Andrew, NP  Insulin Syringes, Disposable, U-100 0.3 ML MISC 1 Dose by Does not apply route every 12 (twelve) hours. 11/10/22   Bing Neighbors, NP  lisinopril (ZESTRIL) 20 MG tablet Take 1 tablet (20 mg total) by mouth daily. 11/10/22   Bing Neighbors, NP  meloxicam (MOBIC) 7.5 MG tablet Take 1 tablet (7.5 mg total) by mouth daily. 11/30/22   Ivonne Andrew, NP  methocarbamol (ROBAXIN) 500 MG tablet Take 1 tablet (500 mg total) by mouth every 12 (twelve) hours as needed for muscle spasms. 01/03/23   Antony Madura, PA-C  rosuvastatin (CRESTOR) 20 MG tablet Take 1 tablet (20 mg total) by mouth daily. 12/01/22 12/01/23  Ivonne Andrew, NP  atorvastatin (LIPITOR) 20 MG tablet Take 1 tablet (20 mg total) by mouth daily. Patient not taking: Reported on 02/09/2019 02/14/18 02/12/20  Claiborne Rigg, NP    Family History Family History  Problem Relation Age of Onset   Healthy Mother    Healthy Father    Hypertension Neg Hx    Diabetes Neg Hx     Social History Social History   Tobacco Use   Smoking status: Never   Smokeless tobacco: Never  Vaping Use   Vaping status: Never Used  Substance Use Topics   Alcohol use: No   Drug use: No     Allergies   Latex, Morphine and codeine, and Tape   Review of Systems Review of Systems  Constitutional: Negative.   HENT: Negative.    Respiratory: Negative.    Cardiovascular: Negative.   Gastrointestinal: Negative.   Skin:  Positive for wound.     Physical Exam Triage Vital Signs ED Triage Vitals  Encounter Vitals Group     BP 03/02/23 1209 139/83     Systolic BP Percentile --      Diastolic BP Percentile --      Pulse Rate 03/02/23 1209 85     Resp 03/02/23 1209 18     Temp 03/02/23 1209 98.1 F (36.7 C)     Temp Source 03/02/23 1209 Oral     SpO2 03/02/23 1209 98 %     Weight --      Height --      Head Circumference --      Peak Flow --      Pain Score 03/02/23 1207 10     Pain Loc --      Pain Education --      Exclude from Growth Chart --    No data found.  Updated Vital Signs BP 139/83 (BP Location: Left Arm)   Pulse 85   Temp 98.1 F (36.7 C) (Oral)   Resp 18   SpO2 98%   Visual Acuity Right Eye Distance:   Left Eye Distance:   Bilateral Distance:    Right Eye Near:   Left Eye Near:    Bilateral Near:     Physical Exam Constitutional:      Appearance: Normal appearance.  Skin:    Comments: At the distal left 3rd finger is a large blister around the nail;  the pad of the finger itself is swollen to  the hand  Neurological:  General: No focal deficit present.     Mental Status: She is alert.  Psychiatric:        Mood and Affect: Mood normal.      UC Treatments / Results  Labs (all labs ordered are listed, but only abnormal results are displayed) Labs Reviewed - No data to display  EKG   Radiology No results found.  Procedures Incision and Drainage  Date/Time: 03/02/2023 12:30 PM  Performed by: Jannifer Franklin, MD Authorized by: Jannifer Franklin, MD   Consent:    Consent obtained:  Verbal   Consent given by:  Patient   Risks discussed:  Bleeding, incomplete drainage and pain Location:    Indications for incision and drainage: paronychia.   Location:  Upper extremity   Upper extremity location:  Finger   Finger location:  L long finger Pre-procedure details:    Skin preparation:  Antiseptic wash Sedation:    Sedation type:  None Anesthesia:    Anesthesia method:  None Procedure type:    Complexity:  Simple Post-procedure details:    Procedure completion:  Tolerated  (including critical care time)  Medications Ordered in UC Medications - No data to display  Initial Impression / Assessment and Plan / UC Course  I have reviewed the triage vital signs and the nursing notes.  Pertinent labs & imaging results that were available during my care of the patient were reviewed by me and considered in my medical decision making (see chart for details).   Final Clinical Impressions(s) / UC Diagnoses   Final diagnoses:  Paronychia of finger of left hand     Discharge Instructions      You were seen today for a paronychia of your finger.  This was opened and drained today.  I have sent out an antibiotic and motrin for pain.  Please keep the area covered while draining. You may also use warm epson salt soaks.  Avoid putting your fingers in your mouth or biting your nails.     ED Prescriptions     Medication Sig Dispense Auth. Provider    amoxicillin-clavulanate (AUGMENTIN) 875-125 MG tablet Take 1 tablet by mouth every 12 (twelve) hours. 14 tablet Danel Requena, MD   ibuprofen (ADVIL) 600 MG tablet Take 1 tablet (600 mg total) by mouth every 6 (six) hours as needed. 30 tablet Jannifer Franklin, MD      PDMP not reviewed this encounter.   Jannifer Franklin, MD 03/02/23 1231

## 2023-03-08 ENCOUNTER — Encounter: Payer: Medicare HMO | Attending: Nurse Practitioner | Admitting: Dietician

## 2023-03-08 ENCOUNTER — Ambulatory Visit: Payer: Self-pay | Admitting: Nurse Practitioner

## 2023-03-08 ENCOUNTER — Other Ambulatory Visit: Payer: Self-pay

## 2023-03-08 ENCOUNTER — Encounter: Payer: Self-pay | Admitting: Dietician

## 2023-03-08 VITALS — Wt 262.5 lb

## 2023-03-08 DIAGNOSIS — E1165 Type 2 diabetes mellitus with hyperglycemia: Secondary | ICD-10-CM

## 2023-03-08 DIAGNOSIS — Z713 Dietary counseling and surveillance: Secondary | ICD-10-CM | POA: Diagnosis not present

## 2023-03-08 DIAGNOSIS — Z794 Long term (current) use of insulin: Secondary | ICD-10-CM | POA: Diagnosis present

## 2023-03-08 MED ORDER — INSULIN GLARGINE 100 UNIT/ML ~~LOC~~ SOLN: 30.0000 [IU] | Freq: Two times a day (BID) | SUBCUTANEOUS | 0 refills | Status: DC

## 2023-03-08 MED ORDER — LIRAGLUTIDE 18 MG/3ML ~~LOC~~ SOPN: 1.8000 mg | PEN_INJECTOR | Freq: Every day | SUBCUTANEOUS | 0 refills | Status: DC

## 2023-03-08 NOTE — Patient Instructions (Addendum)
Goal: walk the neighborhood for 10 minutes at least 5 days per week. (We'll continue to work up).  Goal: make 1/2 of your plate non-starchy vegetables 2 meals per day. (Lunch and dinner).   Other tips:  When snacking, aim to include a complex carb and protein. (Choose from snack sheet). At meals, aim to include 1/2 plate non-starchy vegetables, 1/4 plate protein, and 1/4 plate complex carbs.   Recipe resources: [email or web address]

## 2023-03-08 NOTE — Progress Notes (Signed)
Diabetes Self-Management Education  Visit Type: First/Initial  Appt. Start Time: 0855 Appt. End Time: 0950  03/08/2023  Ms. Evelyn Watts, identified by name and date of birth, is a 47 y.o. female with a diagnosis of Diabetes: Type 2.   ASSESSMENT  History includes: asthma, type 2 diabetes, HTN, HLD Labs noted: 11/30/22 A1c 12.7, chol 230, HDL 35, trig 456, LDL 110, VLDL 79 Medications include: insulin glargine (lantus), victoza, metformin Supplements:  Pt present with her partner.  Pt partner states pt eats a lot of rice and ramen noodles. Pt states they eat 3 meals a day and snack on chips and little debbies in between. Pt partner states they go to sleep at 7pm and wake up at 1am and that's when they eat a large meal (fried chicken and mashed potatoes or pizza) and then go back to sleep at 3am.   Pt states she cooks and they mostly eat at home vs eating out. Pt states she will sometimes eat ramen noodles in between meals and she will add vegetables to them (onion and cilantro).   Pt states she had been taking insulin but her prescription ran out 2 days ago so she hasn't taken her medications in 2 days and states she is going to the doctor today to get them refilled.   Pt states during the day they mostly eat and watch TV. Pt states they walk around the neighborhood once a month. She states she has silver sneakers and could go to [employer] but hasn't yet.  Weight 262 lb 8 oz (119.1 kg). Body mass index is 36.61 kg/m.   Diabetes Self-Management Education - 03/08/23 0844       Visit Information   Visit Type First/Initial      Initial Visit   Diabetes Type Type 2    Date Diagnosed 2005    Are you currently following a meal plan? No    Are you taking your medications as prescribed? No   insulin ran out     Health Coping   How would you rate your overall health? Good      Psychosocial Assessment   Patient Belief/Attitude about Diabetes Motivated to manage diabetes     What is the hardest part about your diabetes right now, causing you the most concern, or is the most worrisome to you about your diabetes?   Taking/obtaining medications    Self-care barriers None    Self-management support Doctor's office    Other persons present Patient    Patient Concerns Nutrition/Meal planning    Special Needs None    Preferred Learning Style No preference indicated    Learning Readiness Ready    How often do you need to have someone help you when you read instructions, pamphlets, or other written materials from your doctor or pharmacy? 1 - Never    What is the last grade level you completed in school? 10      Pre-Education Assessment   Patient understands the diabetes disease and treatment process. Needs Instruction    Patient understands incorporating nutritional management into lifestyle. Needs Instruction    Patient undertands incorporating physical activity into lifestyle. Needs Instruction    Patient understands using medications safely. Needs Instruction    Patient understands monitoring blood glucose, interpreting and using results Needs Instruction    Patient understands prevention, detection, and treatment of acute complications. Needs Instruction    Patient understands prevention, detection, and treatment of chronic complications. Needs Instruction    Patient  understands how to develop strategies to address psychosocial issues. Needs Instruction    Patient understands how to develop strategies to promote health/change behavior. Needs Instruction      Complications   Last HgB A1C per patient/outside source 12.7 %    How often do you check your blood sugar? 1-2 times/day    Fasting Blood glucose range (mg/dL) 086-578    Postprandial Blood glucose range (mg/dL) >469    Have you had a dilated eye exam in the past 12 months? No    Have you had a dental exam in the past 12 months? No    Are you checking your feet? Yes    How many days per week are you  checking your feet? 7      Dietary Intake   Breakfast sweetened oatmeal OR sausage and eggs    Snack (morning) chips OR little debbies    Lunch soup OR pizza OR chicken with macaroni and cheese    Snack (afternoon) chips OR little debbies    Dinner beef tips with rice and broccoli    Snack (evening) 1am: sandwiches or fried chicken    Beverage(s) fruit water with sweet n low, water, occasionally sweet drink,      Activity / Exercise   Activity / Exercise Type ADL's    How many days per week do you exercise? 0    How many minutes per day do you exercise? 0    Total minutes per week of exercise 0      Patient Education   Previous Diabetes Education No    Disease Pathophysiology Definition of diabetes, type 1 and 2, and the diagnosis of diabetes;Explored patient's options for treatment of their diabetes    Healthy Eating Role of diet in the treatment of diabetes and the relationship between the three main macronutrients and blood glucose level;Plate Method;Meal timing in regards to the patients' current diabetes medication.;Meal options for control of blood glucose level and chronic complications.    Being Active Role of exercise on diabetes management, blood pressure control and cardiac health.;Helped patient identify appropriate exercises in relation to his/her diabetes, diabetes complications and other health issue.    Medications Reviewed patients medication for diabetes, action, purpose, timing of dose and side effects.    Monitoring Identified appropriate SMBG and/or A1C goals.;Daily foot exams;Yearly dilated eye exam    Acute complications Taught prevention, symptoms, and  treatment of hypoglycemia - the 15 rule.;Discussed and identified patients' prevention, symptoms, and treatment of hyperglycemia.    Chronic complications Relationship between chronic complications and blood glucose control;Identified and discussed with patient  current chronic complications    Diabetes Stress and  Support Identified and addressed patients feelings and concerns about diabetes;Role of stress on diabetes;Worked with patient to identify barriers to care and solutions    Lifestyle and Health Coping Lifestyle issues that need to be addressed for better diabetes care      Individualized Goals (developed by patient)   Nutrition General guidelines for healthy choices and portions discussed    Physical Activity Exercise 5-7 days per week;15 minutes per day    Medications take my medication as prescribed    Monitoring  Test my blood glucose as discussed    Problem Solving Eating Pattern    Reducing Risk examine blood glucose patterns;do foot checks daily;treat hypoglycemia with 15 grams of carbs if blood glucose less than 70mg /dL    Health Coping Ask for help with psychological, social, or emotional issues  Post-Education Assessment   Patient understands the diabetes disease and treatment process. Comprehends key points    Patient understands incorporating nutritional management into lifestyle. Comprehends key points    Patient undertands incorporating physical activity into lifestyle. Comprehends key points    Patient understands using medications safely. Comphrehends key points    Patient understands monitoring blood glucose, interpreting and using results Comprehends key points    Patient understands prevention, detection, and treatment of acute complications. Comprehends key points    Patient understands prevention, detection, and treatment of chronic complications. Comprehends key points    Patient understands how to develop strategies to address psychosocial issues. Comprehends key points    Patient understands how to develop strategies to promote health/change behavior. Comprehends key points      Outcomes   Expected Outcomes Demonstrated interest in learning. Expect positive outcomes    Future DMSE 3-4 months    Program Status Not Completed             Individualized Plan  for Diabetes Self-Management Training:   Learning Objective:  Patient will have a greater understanding of diabetes self-management. Patient education plan is to attend individual and/or group sessions per assessed needs and concerns.   Plan:   Patient Instructions  Goal: walk the neighborhood for 10 minutes at least 5 days per week. (We'll continue to work up).  Goal: make 1/2 of your plate non-starchy vegetables 2 meals per day. (Lunch and dinner).   Other tips:  When snacking, aim to include a complex carb and protein. (Choose from snack sheet). At meals, aim to include 1/2 plate non-starchy vegetables, 1/4 plate protein, and 1/4 plate complex carbs.   Recipe resources: http://www.peters-cisneros.com/ [email or web address].com/  Expected Outcomes:  Demonstrated interest in learning. Expect positive outcomes  Education material provided: ADA - How to Thrive: A Guide for Your Journey with Diabetes and Snack sheet, Non-starchy vegetable list, Healthy Meal Planning Guide  If problems or questions, patient to contact team via:  Phone  Future DSME appointment: 3-4 months

## 2023-03-12 ENCOUNTER — Ambulatory Visit (HOSPITAL_COMMUNITY)
Admission: EM | Admit: 2023-03-12 | Discharge: 2023-03-12 | Disposition: A | Discharge status: Home / Self Care | Payer: Medicare HMO | Attending: Family Medicine | Admitting: Family Medicine

## 2023-03-12 ENCOUNTER — Encounter (HOSPITAL_COMMUNITY): Payer: Self-pay

## 2023-03-12 ENCOUNTER — Other Ambulatory Visit: Payer: Medicare HMO

## 2023-03-12 DIAGNOSIS — L03112 Cellulitis of left axilla: Secondary | ICD-10-CM

## 2023-03-12 DIAGNOSIS — Z794 Long term (current) use of insulin: Secondary | ICD-10-CM

## 2023-03-12 DIAGNOSIS — L732 Hidradenitis suppurativa: Secondary | ICD-10-CM

## 2023-03-12 DIAGNOSIS — E1165 Type 2 diabetes mellitus with hyperglycemia: Secondary | ICD-10-CM | POA: Diagnosis not present

## 2023-03-12 DIAGNOSIS — K0889 Other specified disorders of teeth and supporting structures: Secondary | ICD-10-CM

## 2023-03-12 MED ORDER — "PEN NEEDLES 3/16"" 31G X 5 MM MISC": 3 refills | Status: DC

## 2023-03-12 MED ORDER — AMOXICILLIN-POT CLAVULANATE 875-125 MG PO TABS: 1.0000 | ORAL_TABLET | Freq: Two times a day (BID) | ORAL | 0 refills | Status: AC

## 2023-03-12 MED ORDER — IBUPROFEN 800 MG PO TABS: 800.0000 mg | ORAL_TABLET | Freq: Three times a day (TID) | ORAL | 0 refills | Status: DC | PRN

## 2023-03-12 MED ORDER — LIRAGLUTIDE 18 MG/3ML ~~LOC~~ SOPN: 1.8000 mg | PEN_INJECTOR | Freq: Every day | SUBCUTANEOUS | 0 refills | Status: DC

## 2023-03-12 NOTE — ED Provider Notes (Signed)
[facility]    CSN: [id number] Arrival date & time: 03/12/23  1000      History   Chief Complaint Chief Complaint  Patient presents with   Dental Pain   Recurrent Skin Infections    HPI Evelyn Watts is a 47 y.o. female.    Dental Pain Here for a broken tooth that broke off last night.  It is in her right upper dental ridge and is painful.  Also she has some swelling and tenderness in her left axilla.  She has had this bother her previously and has not needed antibiotics in the past.  She also has diabetes.  Glucose on lab work in August was 343.  She takes 1.8 units of Victoza daily and her primary care printed the prescription incentive sending it electronically when she was last seen.  She needs a new prescription of the Victoza.    Past Medical History:  Diagnosis Date   Allergies    Asthma    Diabetes mellitus without complication [facility])    Essential hypertension     Patient Active Problem List   Diagnosis Date Noted   Essential hypertension 01/20/2020   COVID-19 virus infection 01/20/2020   Type 2 diabetes mellitus with hyperglycemia, with long-term current use of insulin (HCC) 02/14/2018   Diabetes mellitus without complication (HCC) 08/24/2017   Moderate episode of recurrent major depressive disorder (HCC) 05/24/2016   Morbid obesity (HCC) 05/24/2016    Past Surgical History:  Procedure Laterality Date   CESAREAN SECTION     4   CHOLECYSTECTOMY     HERNIA REPAIR     TONSILLECTOMY      OB History   No obstetric history on file.      Home Medications    Prior to Admission medications   Medication Sig Start Date End Date Taking? Authorizing Provider  albuterol (VENTOLIN HFA) 108 (90 Base) MCG/ACT inhaler Inhale 2 puffs into the lungs every 4 (four) hours as needed for wheezing. 02/12/20  Yes Bast, Traci A, NP  amoxicillin-clavulanate (AUGMENTIN) 875-125 MG tablet Take 1 tablet by mouth 2 (two) times daily for 7 days. 03/12/23  03/19/23 Yes Zenia Resides, MD  blood glucose meter kit and supplies KIT Dispense based on patient and insurance preference. Use up to four times daily as directed. (FOR ICD-9 250.00, 250.01). 09/08/22  Yes Domenick Gong, MD  Blood Glucose Monitoring Suppl DEVI 1 each by Does not apply route in the morning, at noon, and at bedtime. May substitute to any manufacturer covered by patient's insurance. 11/30/22  Yes Ivonne Andrew, NP  cetirizine (ZYRTEC) 10 MG tablet Take 1 tablet (10 mg total) by mouth daily. 11/10/22  Yes Bing Neighbors, NP  Ferrous Sulfate 90 (18 Fe) MG TABS Take 1 tablet by mouth daily. 12/01/22  Yes Ivonne Andrew, NP  folic acid (FOLVITE) 800 MCG tablet Take 0.5 tablets (400 mcg total) by mouth daily. 12/01/22  Yes Ivonne Andrew, NP  gabapentin (NEURONTIN) 100 MG capsule Take 1 capsule (100 mg total) by mouth 3 (three) times daily. 11/10/22  Yes Bing Neighbors, NP  gabapentin (NEURONTIN) 300 MG capsule Take 1 capsule (300 mg total) by mouth 3 (three) times daily as needed for up to 21 doses. 11/21/22  Yes Terald Sleeper, MD  glucose blood test strip Use as instructed. Check blood glucose levels by fingerstick twice per day 11/30/22  Yes Ivonne Andrew, NP  ibuprofen (ADVIL) 800 MG tablet Take 1  tablet (800 mg total) by mouth every 8 (eight) hours as needed (pain). 03/12/23  Yes Zenia Resides, MD  insulin glargine (LANTUS) 100 UNIT/ML injection Inject 0.3 mLs (30 Units total) into the skin 2 (two) times daily. 03/08/23  Yes Ivonne Andrew, NP  Insulin Pen Needle (PEN NEEDLES 3/16") 31G X 5 MM MISC 1 injection daily subcu 03/12/23  Yes Danyell Shader, Janace Aris, MD  Insulin Syringes, Disposable, U-100 0.3 ML MISC 1 Dose by Does not apply route every 12 (twelve) hours. 11/10/22  Yes Bing Neighbors, NP  liraglutide (VICTOZA) 18 MG/3ML SOPN Inject 1.8 mg into the skin daily. 03/12/23  Yes Zenia Resides, MD  lisinopril (ZESTRIL) 20 MG tablet Take 1 tablet (20 mg  total) by mouth daily. 11/10/22  Yes Bing Neighbors, NP  rosuvastatin (CRESTOR) 20 MG tablet Take 1 tablet (20 mg total) by mouth daily. 12/01/22 12/01/23 Yes Ivonne Andrew, NP  metFORMIN (GLUCOPHAGE) 500 MG tablet Take 2 tablets (1,000 mg total) by mouth 2 (two) times daily with a meal. 11/10/22 03/02/23  Bing Neighbors, NP  atorvastatin (LIPITOR) 20 MG tablet Take 1 tablet (20 mg total) by mouth daily. Patient not taking: Reported on 02/09/2019 02/14/18 02/12/20  Claiborne Rigg, NP    Family History Family History  Problem Relation Age of Onset   Healthy Mother    Healthy Father    Hypertension Neg Hx    Diabetes Neg Hx     Social History Social History   Tobacco Use   Smoking status: Never   Smokeless tobacco: Never  Vaping Use   Vaping status: Never Used  Substance Use Topics   Alcohol use: No   Drug use: No     Allergies   Latex, Morphine and codeine, and Tape   Review of Systems Review of Systems   Physical Exam Triage Vital Signs ED Triage Vitals  Encounter Vitals Group     BP 03/12/23 1009 138/77     Systolic BP Percentile --      Diastolic BP Percentile --      Pulse Rate 03/12/23 1009 89     Resp 03/12/23 1009 16     Temp 03/12/23 1009 97.8 F (36.6 C)     Temp Source 03/12/23 1009 Oral     SpO2 03/12/23 1009 98 %     Weight --      Height --      Head Circumference --      Peak Flow --      Pain Score 03/12/23 1011 6     Pain Loc --      Pain Education --      Exclude from Growth Chart --    No data found.  Updated Vital Signs BP 138/77 (BP Location: Left Arm)   Pulse 89   Temp 97.8 F (36.6 C) (Oral)   Resp 16   SpO2 98%   Visual Acuity Right Eye Distance:   Left Eye Distance:   Bilateral Distance:    Right Eye Near:   Left Eye Near:    Bilateral Near:     Physical Exam Vitals reviewed.  Constitutional:      General: She is not in acute distress.    Appearance: She is not ill-appearing, toxic-appearing or  diaphoretic.  HENT:     Mouth/Throat:     Mouth: Mucous membranes are moist.     Pharynx: No oropharyngeal exudate or posterior oropharyngeal erythema.  Comments: On her right upper dental ridge there is a tooth that is broken off at the gumline.  No swelling currently Eyes:     Extraocular Movements: Extraocular movements intact.     Conjunctiva/sclera: Conjunctivae normal.     Pupils: Pupils are equal, round, and reactive to light.  Cardiovascular:     Rate and Rhythm: Normal rate and regular rhythm.     Heart sounds: No murmur heard. Pulmonary:     Effort: Pulmonary effort is normal.     Breath sounds: Normal breath sounds.  Musculoskeletal:     Cervical back: Neck supple.  Lymphadenopathy:     Cervical: No cervical adenopathy.  Skin:    Capillary Refill: Capillary refill takes less than 2 seconds.     Coloration: Skin is not jaundiced or pale.     Comments: In her left axilla there is an area of induration and tenderness and mild erythema.  It is about 1.5 cm in diameter.  There is no area of fluctuance currently.  Neurological:     General: No focal deficit present.     Mental Status: She is alert and oriented to person, place, and time.  Psychiatric:        Behavior: Behavior normal.      UC Treatments / Results  Labs (all labs ordered are listed, but only abnormal results are displayed) Labs Reviewed - No data to display  EKG   Radiology No results found.  Procedures Procedures (including critical care time)  Medications Ordered in UC Medications - No data to display  Initial Impression / Assessment and Plan / UC Course  I have reviewed the triage vital signs and the nursing notes.  Pertinent labs & imaging results that were available during my care of the patient were reviewed by me and considered in my medical decision making (see chart for details).     She declines my offer of Toradol. Augmentin is sent in for the cellulitis in her axilla  infection/hidradenitis suppurativa.  I will also cover any possible dental infection.  Ibuprofen is sent in for the pain and  Victoza is sent in for her diabetes. Final Clinical Impressions(s) / UC Diagnoses   Final diagnoses:  Pain, dental  Cellulitis of axilla, left  Hidradenitis suppurativa  Type 2 diabetes mellitus with hyperglycemia, with long-term current use of insulin (HCC)     Discharge Instructions      Take ibuprofen 800 mg--1 tab every 8 hours as needed for pain.  Take amoxicillin-clavulanate 875 mg--1 tab twice daily with food for 7 days  Continue your Victoza 1.8 units daily.     ED Prescriptions     Medication Sig Dispense Auth. Provider   liraglutide (VICTOZA) 18 MG/3ML SOPN Inject 1.8 mg into the skin daily. 15 mL Zenia Resides, MD   amoxicillin-clavulanate (AUGMENTIN) 875-125 MG tablet Take 1 tablet by mouth 2 (two) times daily for 7 days. 14 tablet Silus Lanzo, Janace Aris, MD   ibuprofen (ADVIL) 800 MG tablet Take 1 tablet (800 mg total) by mouth every 8 (eight) hours as needed (pain). 21 tablet Zenia Resides, MD   Insulin Pen Needle (PEN NEEDLES 3/16") 31G X 5 MM MISC 1 injection daily subcu 100 each Kamiya Acord, Janace Aris, MD      I have reviewed the PDMP during this encounter.   Zenia Resides, MD 03/12/23 1032

## 2023-03-12 NOTE — ED Triage Notes (Signed)
Pt is here with several complaints. Pt states she has boil under her left arm that has an odor. Pt reports dental pain. Pt reports she needs an refill on her Victoza. She states her PCP did not send to pharmacy.

## 2023-03-12 NOTE — Discharge Instructions (Signed)
Take ibuprofen 800 mg--1 tab every 8 hours as needed for pain.  Take amoxicillin-clavulanate 875 mg--1 tab twice daily with food for 7 days  Continue your Victoza 1.8 units daily.

## 2023-03-20 ENCOUNTER — Ambulatory Visit: Payer: Self-pay | Admitting: Nurse Practitioner

## 2023-03-20 ENCOUNTER — Ambulatory Visit (INDEPENDENT_AMBULATORY_CARE_PROVIDER_SITE_OTHER): Payer: Medicare HMO | Admitting: Nurse Practitioner

## 2023-03-20 ENCOUNTER — Encounter: Payer: Self-pay | Admitting: Nurse Practitioner

## 2023-03-20 VITALS — BP 162/87 | HR 91 | Temp 98.1°F | Resp 14 | Ht 71.0 in | Wt 260.0 lb

## 2023-03-20 DIAGNOSIS — E1165 Type 2 diabetes mellitus with hyperglycemia: Secondary | ICD-10-CM | POA: Diagnosis not present

## 2023-03-20 DIAGNOSIS — M79671 Pain in right foot: Secondary | ICD-10-CM | POA: Diagnosis not present

## 2023-03-20 DIAGNOSIS — M79672 Pain in left foot: Secondary | ICD-10-CM | POA: Diagnosis not present

## 2023-03-20 DIAGNOSIS — Z794 Long term (current) use of insulin: Secondary | ICD-10-CM

## 2023-03-20 LAB — POCT GLYCOSYLATED HEMOGLOBIN (HGB A1C): HbA1c, POC (controlled diabetic range): 12.6 % — AB (ref 0.0–7.0)

## 2023-03-20 MED ORDER — IBUPROFEN 800 MG PO TABS
800.0000 mg | ORAL_TABLET | Freq: Three times a day (TID) | ORAL | 0 refills | Status: DC | PRN
Start: 2023-03-20 — End: 2023-07-11

## 2023-03-20 MED ORDER — GABAPENTIN 300 MG PO CAPS: 300.0000 mg | ORAL_CAPSULE | Freq: Three times a day (TID) | ORAL | 0 refills | Status: DC | PRN

## 2023-03-20 NOTE — Progress Notes (Signed)
Subjective   Patient ID: Evelyn Watts, female    DOB: Aug 07, 1975, 47 y.o.   MRN: [id number]  Chief Complaint  Patient presents with   Pain    Referring provider: No ref. provider found  Evelyn Watts is a 47 y.o. female with Past Medical History: No date: Allergies No date: Asthma No date: Diabetes mellitus without complication (HCC) No date: Essential hypertension   HPI  Patient presents today for a follow-up visit.  A1c in office today continues to stay elevated at 12.6.  Patient states that she is having trouble getting some of her medications.  We will place a referral for pharmacy to assist her with medications and diabetic medication management.  Patient complains of ongoing foot pain.  She was referred to podiatry at last visit here but they were unable to get in touch with her.  We discussed that she can call them to set up an appointment. Denies f/c/s, n/v/d, hemoptysis, PND, leg swelling Denies chest pain or edema      Allergies  Allergen Reactions   Latex Rash   Morphine And Codeine Rash   Tape Rash    Immunization History  Administered Date(s) Administered   Influenza,inj,Quad PF,6+ Mos 02/14/2018    Tobacco History: Social History   Tobacco Use  Smoking Status Never  Smokeless Tobacco Never   Counseling given: Not Answered   Outpatient Encounter Medications as of 03/20/2023  Medication Sig   albuterol (VENTOLIN HFA) 108 (90 Base) MCG/ACT inhaler Inhale 2 puffs into the lungs every 4 (four) hours as needed for wheezing.   blood glucose meter kit and supplies KIT Dispense based on patient and insurance preference. Use up to four times daily as directed. (FOR ICD-9 250.00, 250.01).   Blood Glucose Monitoring Suppl DEVI 1 each by Does not apply route in the morning, at noon, and at bedtime. May substitute to any manufacturer covered by patient's insurance.   cetirizine (ZYRTEC) 10 MG tablet Take 1 tablet (10 mg total) by mouth daily.   Ferrous  Sulfate 90 (18 Fe) MG TABS Take 1 tablet by mouth daily.   folic acid (FOLVITE) 800 MCG tablet Take 0.5 tablets (400 mcg total) by mouth daily.   glucose blood test strip Use as instructed. Check blood glucose levels by fingerstick twice per day   insulin glargine (LANTUS) 100 UNIT/ML injection Inject 0.3 mLs (30 Units total) into the skin 2 (two) times daily.   Insulin Pen Needle (PEN NEEDLES 3/16") 31G X 5 MM MISC 1 injection daily subcu   Insulin Syringes, Disposable, U-100 0.3 ML MISC 1 Dose by Does not apply route every 12 (twelve) hours.   liraglutide (VICTOZA) 18 MG/3ML SOPN Inject 1.8 mg into the skin daily.   lisinopril (ZESTRIL) 20 MG tablet Take 1 tablet (20 mg total) by mouth daily.   rosuvastatin (CRESTOR) 20 MG tablet Take 1 tablet (20 mg total) by mouth daily.   [DISCONTINUED] gabapentin (NEURONTIN) 100 MG capsule Take 1 capsule (100 mg total) by mouth 3 (three) times daily.   [DISCONTINUED] gabapentin (NEURONTIN) 300 MG capsule Take 1 capsule (300 mg total) by mouth 3 (three) times daily as needed for up to 21 doses.   [DISCONTINUED] ibuprofen (ADVIL) 800 MG tablet Take 1 tablet (800 mg total) by mouth every 8 (eight) hours as needed (pain).   gabapentin (NEURONTIN) 300 MG capsule Take 1 capsule (300 mg total) by mouth 3 (three) times daily as needed for up to 21 doses.   ibuprofen (ADVIL)  800 MG tablet Take 1 tablet (800 mg total) by mouth every 8 (eight) hours as needed (pain).   metFORMIN (GLUCOPHAGE) 500 MG tablet Take 2 tablets (1,000 mg total) by mouth 2 (two) times daily with a meal.   [DISCONTINUED] atorvastatin (LIPITOR) 20 MG tablet Take 1 tablet (20 mg total) by mouth daily. (Patient not taking: Reported on 02/09/2019)   No facility-administered encounter medications on file as of 03/20/2023.    Review of Systems  Review of Systems  Constitutional: Negative.   HENT: Negative.    Cardiovascular: Negative.   Gastrointestinal: Negative.   Allergic/Immunologic:  Negative.   Neurological: Negative.   Psychiatric/Behavioral: Negative.       Objective:   BP (!) 162/87 (BP Location: Left Arm, Patient Position: Sitting, Cuff Size: Large)   Pulse 91   Temp 98.1 F (36.7 C)   Resp 14   Ht 5\' 11"  (1.803 m)   Wt 260 lb (117.9 kg)   SpO2 99%   BMI 36.26 kg/m   Wt Readings from Last 5 Encounters:  03/20/23 260 lb (117.9 kg)  03/08/23 262 lb 8 oz (119.1 kg)  02/08/23 255 lb (115.7 kg)  01/02/23 255 lb 11.7 oz (116 kg)  11/30/22 256 lb 3.2 oz (116.2 kg)     Physical Exam Vitals and nursing note reviewed.  Constitutional:      General: She is not in acute distress.    Appearance: She is well-developed.  Cardiovascular:     Rate and Rhythm: Normal rate and regular rhythm.  Pulmonary:     Effort: Pulmonary effort is normal.     Breath sounds: Normal breath sounds.  Neurological:     Mental Status: She is alert and oriented to person, place, and time.       Assessment & Plan:   Lipid screening  Type 2 diabetes mellitus with hyperglycemia, with long-term current use of insulin (HCC) -     POCT glycosylated hemoglobin (Hb A1C) -     AMB Referral [facility] Care Management -     CBC -     Comprehensive metabolic panel  Pain in both feet -     Ibuprofen; Take 1 tablet (800 mg total) by mouth every 8 (eight) hours as needed (pain).  Dispense: 21 tablet; Refill: 0 -     Gabapentin; Take 1 capsule (300 mg total) by mouth 3 (three) times daily as needed for up to 21 doses.  Dispense: 21 capsule; Refill: 0     No follow-ups on file.   Ivonne Andrew, NP 03/20/2023

## 2023-03-20 NOTE — Patient Instructions (Addendum)
1. Type 2 diabetes mellitus with hyperglycemia, with long-term current use of insulin (HCC)  - POCT glycosylated hemoglobin (Hb A1C) - AMB Referral VBCI Care Management - CBC - Comprehensive metabolic panel  2. Pain in both feet  - ibuprofen (ADVIL) 800 MG tablet; Take 1 tablet (800 mg total) by mouth every 8 (eight) hours as needed (pain).  Dispense: 21 tablet; Refill: 0 - gabapentin (NEURONTIN) 300 MG capsule; Take 1 capsule (300 mg total) by mouth 3 (three) times daily as needed for up to 21 doses.  Dispense: 21 capsule; Refill: 0  Follow up:  Follow up in 3 months

## 2023-03-21 ENCOUNTER — Telehealth: Payer: Self-pay

## 2023-03-21 LAB — COMPREHENSIVE METABOLIC PANEL
ALT: 11 [IU]/L (ref 0–32)
AST: 12 [IU]/L (ref 0–40)
Albumin: 4 g/dL (ref 3.9–4.9)
Alkaline Phosphatase: 109 [IU]/L (ref 44–121)
BUN/Creatinine Ratio: 27 — ABNORMAL HIGH (ref 9–23)
BUN: 18 mg/dL (ref 6–24)
Bilirubin Total: <0.2 mg/dL (ref 0.0–1.2)
CO2: 20 mmol/L (ref 20–29)
Calcium: 9.6 mg/dL (ref 8.7–10.2)
Chloride: 95 mmol/L — ABNORMAL LOW (ref 96–106)
Creatinine, Ser: 0.67 mg/dL (ref 0.57–1.00)
Globulin, Total: 2.9 g/dL (ref 1.5–4.5)
Glucose: 421 mg/dL — ABNORMAL HIGH (ref 70–99)
Potassium: 4.9 mmol/L (ref 3.5–5.2)
Sodium: 131 mmol/L — ABNORMAL LOW (ref 134–144)
Total Protein: 6.9 g/dL (ref 6.0–8.5)
eGFR: 110 mL/min/{1.73_m2} (ref >59)

## 2023-03-21 LAB — CBC
Hematocrit: 34.8 % (ref 34.0–46.6)
Hemoglobin: 10.5 g/dL — ABNORMAL LOW (ref 11.1–15.9)
MCH: 23 pg — ABNORMAL LOW (ref 26.6–33.0)
MCHC: 30.2 g/dL — ABNORMAL LOW (ref 31.5–35.7)
MCV: 76 fL — ABNORMAL LOW (ref 79–97)
Platelets: 454 10*3/uL — ABNORMAL HIGH (ref 150–450)
RBC: 4.56 x10E6/uL (ref 3.77–5.28)
RDW: 16.5 % — ABNORMAL HIGH (ref 11.7–15.4)
WBC: 7.9 10*3/uL (ref 3.4–10.8)

## 2023-03-21 NOTE — Progress Notes (Signed)
   Care Guide Note  03/21/2023 Name: Evelyn Watts MRN: [id number] DOB: 15-Jul-1975  Referred by: Pcp, No Reason for referral : Care Coordination (Outreach to schedule with pharm d )   Evelyn Watts is a 46 y.o. year old female who is a primary care patient of Pcp, No. Veda Atcheson was referred to the pharmacist for assistance related to DM.    Successful contact was made with the patient to discuss pharmacy services including being ready for the pharmacist to call at least 5 minutes before the scheduled appointment time, to have medication bottles and any blood sugar or blood pressure readings ready for review. The patient agreed to meet with the pharmacist via with the pharmacist via telephone visit on (date/time).  04/09/2023  [name] , RMA     [facility] Guide  Direct Dial: [phone]  Website: [email or web address]

## 2023-04-06 ENCOUNTER — Other Ambulatory Visit: Payer: Self-pay

## 2023-04-06 ENCOUNTER — Telehealth: Payer: Self-pay

## 2023-04-06 DIAGNOSIS — M79671 Pain in right foot: Secondary | ICD-10-CM

## 2023-04-06 MED ORDER — GABAPENTIN 300 MG PO CAPS: 300.0000 mg | ORAL_CAPSULE | Freq: Three times a day (TID) | ORAL | 0 refills | Status: DC | PRN

## 2023-04-06 NOTE — Telephone Encounter (Signed)
Copied from CRM [id number]. Topic: Clinical - Medication Refill >> Apr 06, 2023  2:55 PM Hector Shade B wrote: Most Recent Primary Care Visit:  Provider: Ivonne Andrew  Department: [facility]  Visit Type: OFFICE VISIT  Date: 03/20/2023  Medication: Gabapentin 300 mg, requested at least a months worth until her appointment next week  Has the patient contacted their pharmacy? Yes (Agent: If no, request that the patient contact the pharmacy for the refill. If patient does not wish to contact the pharmacy document the reason why and proceed with request.) (Agent: If yes, when and what did the pharmacy advise?)  Is this the correct pharmacy for this prescription? Yes If no, delete pharmacy and type the correct one.  This is the patient's preferred pharmacy:  [facility] [address], [place] - [address] [place] [address] Phone: [phone] Fax: [phone]  [id number] [place], [place] - [address] AT [facility] [address] [place] [address] Phone: [phone] Fax: [phone]   Has the prescription been filled recently? Yes  Is the patient out of the medication? Yes  Has the patient been seen for an appointment in the last year OR does the patient have an upcoming appointment? Yes  Can we respond through MyChart? No not yet sent patient a link via text walked her throught the set up she stated she needed to hang up before she could received text advised her on the benefits of having my chart.   Agent: Please be advised that Rx refills may take up to 3 business days. We ask that you follow-up with your pharmacy.  Chief Complaint: Medication refill Symptoms: na Frequency: na Pertinent Negatives: Patient denies NA Disposition: [] ED /[] Urgent Care (no appt availability in office) / [] Appointment(In office/virtual)/ []  [employer] [facility]/ [] Home Care/ [] Refused Recommended  Disposition /[] [place] [place] [facility]/ [x]  Follow-up with PCP Additional Notes: Medication refill request routed to office.

## 2023-04-06 NOTE — Telephone Encounter (Signed)
Copied from CRM [id number]. Topic: Clinical - Medical Advice >> Apr 06, 2023  9:23 AM Mosetta Putt H wrote: Reason for CRM: patient is really irritated in vagina area when use colored towels is asking for prescription to clear it up  Pt will need to be seen for this to determine the treatment. Pt was lvm to call back to make an appt. KH

## 2023-04-06 NOTE — Telephone Encounter (Signed)
It looks like it was in 2 weeks ago. Please advise [facility]

## 2023-04-09 ENCOUNTER — Other Ambulatory Visit: Payer: Medicaid Other

## 2023-04-09 DIAGNOSIS — E1169 Type 2 diabetes mellitus with other specified complication: Secondary | ICD-10-CM

## 2023-04-09 DIAGNOSIS — M79671 Pain in right foot: Secondary | ICD-10-CM

## 2023-04-09 DIAGNOSIS — I10 Essential (primary) hypertension: Secondary | ICD-10-CM

## 2023-04-09 DIAGNOSIS — E1165 Type 2 diabetes mellitus with hyperglycemia: Secondary | ICD-10-CM

## 2023-04-09 DIAGNOSIS — J302 Other seasonal allergic rhinitis: Secondary | ICD-10-CM

## 2023-04-09 NOTE — Progress Notes (Unsigned)
04/09/2023 Name: Evelyn Watts MRN: [id number] DOB: 03/13/1976  Chief Complaint  Patient presents with   Diabetes   Hypertension   Hyperlipidemia         Evelyn Watts is a 47 y.o. year old female who presented for a telephone visit.   They were referred to the pharmacist by their PCP for assistance in managing diabetes and hypertension. PMH includes HTN, T2DM, MDD, obesity, asthma.    Subjective: Doing ok today. Had nutrition appt on 03/08/23.   She reports that she has called into the office about her yeast infection, but has not heard back yet. She would like a prescription for fluconazole.   Care Team: Primary Care Provider: Pcp, No ; Next Scheduled Visit: not scheduled  Medication Access/Adherence  Current Pharmacy:  [id number] [place], [place] - [address] [place] [address] Phone: [phone] Fax: [phone]  [facility] - [place], [place] - [address] [place] [address] Phone: [phone] Fax: [phone]   Patient reports affordability concerns with their medications: No  Patient reports access/transportation concerns to their pharmacy: Yes  - she is also using multiple pharmacies. Currently prefers [facility] and [facility].  Patient reports adherence concerns with their medications:  Yes  - see below  Insurance: Medicare/Medicaid - medication copays are $0  Reports that [facility] ([address] only fills 1 insulin vial at a time - which is a 16 day supply, so she is only taking 10-15 units twice a day to stretch her supply to prevent needing to go to the pharmacy as often. She knows that [facility] mail order pharmacy will give her 90 day supplies so she would like to switch her diabetes medications to [employer].  She states that she has been taking her Victoza every day, since they will give her longer day supplies of that medication -  though it sounds like she has not always taken this daily.   Reports that she needs test strips and lancets in order to monitor her blood sugar. She also would like to switch from an insulin vial to pen, though she will need insulin pen needles. She requests refills of gabapentin and ibuprofen for pain.   She is only taking metformin 1 tab in the AM, noon, and PM  Diabetes:  Current medications: liraglutide (Victoza) 1.8 mg subcutaneous daily, metformin IR 1000 mg PO BID, insulin glargine (Lantus) 30 units subcutaneous BID Medications tried in the past: dulaglutide (upset stomach, nausea),   Current glucose readings: not checking - needs testing supplies, last time it was 400 (was sick on her stomach, she felt may have been making things worse).  Using Accu-chek guide me meter meter  Patient denies hypoglycemic s/sx including dizziness, shakiness, sweating. Patient denies hyperglycemic symptoms including polyuria, polydipsia, polyphagia, nocturia, neuropathy, blurred vision.  Current meal patterns:  - Breakfast: trying to eat eggs in the morning,  - Lunch: trying to eat more salads - Supper: chicken,  - Drinks: soda very occasionally, drink 4-5 glasses of water/day Denies frequent intake of fried food.  Dietary intake per nutrition appt on 11/13: - Breakfast: sweetened oatmeal OR sausage and eggs  - Snack (morning): chips OR little debbies  - Lunch: soup OR pizza OR chicken with macaroni and cheese  - Snack (afternoon): chips OR little debbies - Dinner: beef tips with rice and broccoli  - Snack (evening): 1am: sandwiches or fried chicken  - Beverages: fruit  water with sweet n low, water, occasionally sweet drink,  Current physical activity: Increased walking since nutritionist appt - minimal physical activity at baseline  Current medication access support: none  Hypertension:  Current medications: lisinopril 20 mg PO daily  Patient does not have a validated, automated, upper  arm home BP cuff  Patient denies hypotensive s/sx including dizziness, lightheadedness.  Patient denies hypertensive symptoms including headache, chest pain, shortness of breath   Hyperlipidemia/ASCVD Risk Reduction  Current lipid lowering medications: rosuvastatin 20 mg PO daily  Clinical ASCVD: No  The 10-year ASCVD risk score (Arnett DK, et al., 2019) is: 6.7%   Values used to calculate the score:     Age: 65 years     Sex: Female     Is Non-Hispanic African American: No     Diabetic: Yes     Tobacco smoker: No     Systolic Blood Pressure: 162 mmHg     Is BP treated: Yes     HDL Cholesterol: 41 mg/dL     Total Cholesterol: 230 mg/dL    Objective:  Lab Results  Component Value Date   HGBA1C 12.6 (A) 03/20/2023    Lab Results  Component Value Date   CREATININE 0.67 03/20/2023   BUN 18 03/20/2023   NA 131 (L) 03/20/2023   K 4.9 03/20/2023   CL 95 (L) 03/20/2023   CO2 20 03/20/2023    Lab Results  Component Value Date   CHOL 230 (H) 11/30/2022   HDL 41 11/30/2022   LDLCALC 110 (H) 11/30/2022   TRIG 456 (H) 11/30/2022   CHOLHDL 5.6 (H) 11/30/2022    Medications Reviewed Today   Medications were not reviewed in this encounter    BP Readings from Last 3 Encounters:  03/20/23 (!) 162/87  03/12/23 138/77  03/02/23 139/83     Assessment/Plan:   Diabetes: - Currently uncontrolled with last A1c 12.6% uncontrolled above goal <7%. DM control worsened by difficulty obtaining medications, despite $0 copays. Patient would benefit from transitioning from liraglutide (Victoza) to more potent GLP-1 like tirzepatide Greggory Keen) to help with DM control and weight loss, however patient prefers to sort out obtaining her other medications before making a switch. Will need to monitor for s/sx of pancreatitis with elevated TG. She does get recurrent yeast infections - so she would not be a good SGLT2i candidate - and sodium is low. Complains of GI upset and is unable to take  full dose metformin - will switch to XR version. Patient is following with nutrition at patient care center.  - Reviewed long term cardiovascular and renal outcomes of uncontrolled blood sugar - Reviewed goal A1c, goal fasting, and goal 2 hour post prandial glucose - Reviewed dietary modifications including decreasing carbohydrate heavy foods. Educated on side effects associated with GLP-1's and eating past the point of feeling full. Discussed that fatty and greasy foods increase likelihood of GI upset.  - Reviewed lifestyle modifications including: increasing physical activity as able.  - Recommend to start taking prescribed dose of insulin glargine (Lantus) 30 units subcutaneous BID. Will prescribe 90 day supply of insulin PEN instead of vial with pen needles. Will consider consolidating to once daily insulin or longer acting insulin degludec at f/u.  - Recommend to continue liraglutide (Victoza) 1.8 mg subcutaneous daily. Consider switching to more potent GLP1/GIP at f/u, though patient will need to be motivated to change her diet to reduce GI upset. Did not send Victoza to mail order pharmacy as patient just [facility]  - Recommend to start taking full dose metformin XR 1000 mg total BID. Will transition from IR to XR metformin.  - Patient denies personal or family history of multiple endocrine neoplasia type 2, medullary thyroid cancer; personal history of pancreatitis or gallbladder disease. - Recommend to check glucose once daily fasting and occasional 2 hr PPG.   Hypertension: - Currently uncontrolled with last BP above goal < 130/80 mmHg -adherence unclear. Patient would likely benefit from addition of CCB amlodipine to regimen, though patient feels uncomfortable adding another medication until current adherence issues are sorted out. Will continue to follow.  - Reviewed long term cardiovascular and renal outcomes of uncontrolled blood pressure - Reviewed  appropriate blood pressure monitoring technique and reviewed goal blood pressure. Recommended to check home blood pressure and heart rate. Will prescribe BP monitor for patient to obtain for free through Medicaid. Will send to [facility] and DME for mail order.  - Recommend to continue lisinopril 20 mg PO daily. Consider addition of amlodipine 5 mg PO daily at f/u.    Hyperlipidemia/ASCVD Risk Reduction: - Currently uncontrolled with LDL-C 110 mg/dL above goal < 70 mg/dL and likely falsely low with TG elevated to 456 mg/dL. Has not been rechecked since patient initiated rosuvastatin 20 mg in August 2024. Patient has been filling medication.  - Reviewed long term complications of uncontrolled cholesterol - Recommend to continue rosuvastatin 20 mg PO daily and recheck at PCP f/u.    Medication Management: - Currently strategy insufficient to maintain appropriate adherence to prescribed medication regimen. Patient is using multiple pharmacies and has been unable to obtain more than a 16 day supply of insulin.  - Suggested use of weekly pill box to organize medications - patient agreeable to [facility].  - Will collaborate with PCP to send as many maintenance medications as possible to [facility] pharmacy for 90 to 100 day supplies to improve adherence. Educated patient that she will need to be in communication with the pharmacy to facilitate refills.   Follow Up Plan: Pharmacist 04/29/22, PCP needs to be scheduled.   Nils Pyle, PharmD PGY1 Pharmacy Resident

## 2023-04-10 ENCOUNTER — Other Ambulatory Visit: Payer: Self-pay | Admitting: Nurse Practitioner

## 2023-04-10 ENCOUNTER — Telehealth: Payer: Self-pay

## 2023-04-10 MED ORDER — FLUCONAZOLE 150 MG PO TABS: 150.0000 mg | ORAL_TABLET | Freq: Every day | ORAL | 0 refills | Status: DC

## 2023-04-10 MED ORDER — ACCU-CHEK SOFTCLIX LANCETS MISC: 12 refills | Status: DC

## 2023-04-10 MED ORDER — LISINOPRIL 20 MG PO TABS: 20.0000 mg | ORAL_TABLET | Freq: Every day | ORAL | 3 refills | Status: DC

## 2023-04-10 MED ORDER — INSULIN GLARGINE 100 UNIT/ML SOLOSTAR PEN: 30.0000 [IU] | PEN_INJECTOR | Freq: Two times a day (BID) | SUBCUTANEOUS | 11 refills | Status: DC

## 2023-04-10 MED ORDER — BLOOD PRESSURE CUFF MISC: 0 refills | Status: DC

## 2023-04-10 MED ORDER — ALBUTEROL SULFATE HFA 108 (90 BASE) MCG/ACT IN AERS: 2.0000 | INHALATION_SPRAY | RESPIRATORY_TRACT | 6 refills | Status: DC | PRN

## 2023-04-10 MED ORDER — ROSUVASTATIN CALCIUM 20 MG PO TABS: 20.0000 mg | ORAL_TABLET | Freq: Every day | ORAL | 3 refills | Status: DC

## 2023-04-10 MED ORDER — ACCU-CHEK GUIDE TEST VI STRP: ORAL_STRIP | 12 refills | Status: DC

## 2023-04-10 MED ORDER — "PEN NEEDLES 3/16"" 31G X 5 MM MISC": 3 refills | Status: DC

## 2023-04-10 MED ORDER — GABAPENTIN 300 MG PO CAPS: 300.0000 mg | ORAL_CAPSULE | Freq: Three times a day (TID) | ORAL | 0 refills | Status: DC

## 2023-04-10 MED ORDER — METFORMIN HCL ER 500 MG PO TB24: 1000.0000 mg | ORAL_TABLET | Freq: Two times a day (BID) | ORAL | 3 refills | Status: DC

## 2023-04-10 NOTE — Telephone Encounter (Signed)
Per Archie Patten call patient to advise that I sent in [facility], Called and advised patient -CB

## 2023-04-30 ENCOUNTER — Other Ambulatory Visit: Payer: Self-pay

## 2023-04-30 NOTE — Progress Notes (Signed)
   Attempted to contact patient for scheduled appointment for medication management. Left HIPAA compliant message for patient to return my call at their convenience.   Per dispense report - appears that patient received 90 day supply of maintenance medications from [facility] mail order pharmacy as set up at last telephone appointment. Patient does not have PCP f/u scheduled. Also need to f/u whether she received home BP cuff. Will continue to outreach.   Lorain Baseman, PharmD PGY1 Pharmacy Resident

## 2023-05-11 ENCOUNTER — Telehealth: Payer: Self-pay

## 2023-05-11 NOTE — Progress Notes (Unsigned)
Complex Care Management Care Guide Note  05/11/2023 Name: [name] MRN: [id number] DOB: 1976/04/05  [name] is a 48 y.o. year old female who is a primary care patient of Pcp, No and is actively engaged with the care management team. I reached out to Vonzell Schlatter by phone today to assist with re-scheduling  with the Pharmacist.  Follow up plan: Unsuccessful telephone outreach attempt made. A HIPAA compliant phone message was left for the patient providing contact information and requesting a return call.  [name] , RMA     [facility]  [facility] Guide  Direct Dial: [phone]  Website: [email or web address]

## 2023-05-25 NOTE — Progress Notes (Unsigned)
Complex Care Management Care Guide Note  05/25/2023 Name: Evelyn Watts MRN: [id number] DOB: 06-07-1975  Evelyn Watts is a 48 y.o. year old female who is a primary care patient of Pcp, No and is actively engaged with the care management team. I reached out to Vonzell Schlatter by phone today to assist with re-scheduling  with the Pharmacist.  Follow up plan: Unsuccessful telephone outreach attempt made. A HIPAA compliant phone message was left for the patient providing contact information and requesting a return call.  [name] , RMA     [facility]  [facility] Guide  Direct Dial: [phone]  Website: [email or web address]

## 2023-06-06 ENCOUNTER — Ambulatory Visit: Payer: Medicare HMO | Admitting: Dietician

## 2023-06-07 ENCOUNTER — Ambulatory Visit: Payer: Self-pay | Admitting: Nurse Practitioner

## 2023-06-08 ENCOUNTER — Ambulatory Visit (INDEPENDENT_AMBULATORY_CARE_PROVIDER_SITE_OTHER): Payer: Self-pay | Admitting: Nurse Practitioner

## 2023-06-08 ENCOUNTER — Encounter: Payer: Self-pay | Admitting: Nurse Practitioner

## 2023-06-08 VITALS — BP 135/91 | HR 87 | Temp 97.9°F | Wt 265.2 lb

## 2023-06-08 DIAGNOSIS — Z23 Encounter for immunization: Secondary | ICD-10-CM

## 2023-06-08 DIAGNOSIS — Z794 Long term (current) use of insulin: Secondary | ICD-10-CM | POA: Diagnosis not present

## 2023-06-08 DIAGNOSIS — E1165 Type 2 diabetes mellitus with hyperglycemia: Secondary | ICD-10-CM | POA: Diagnosis not present

## 2023-06-08 DIAGNOSIS — K047 Periapical abscess without sinus: Secondary | ICD-10-CM | POA: Diagnosis not present

## 2023-06-08 DIAGNOSIS — B379 Candidiasis, unspecified: Secondary | ICD-10-CM

## 2023-06-08 LAB — POCT GLYCOSYLATED HEMOGLOBIN (HGB A1C): Hemoglobin A1C: 12.8 % — AB (ref 4.0–5.6)

## 2023-06-08 MED ORDER — IBUPROFEN 600 MG PO TABS
600.0000 mg | ORAL_TABLET | Freq: Three times a day (TID) | ORAL | 0 refills | Status: DC | PRN
Start: 2023-06-08 — End: 2023-06-29

## 2023-06-08 MED ORDER — LIRAGLUTIDE 18 MG/3ML ~~LOC~~ SOPN
1.8000 mg | PEN_INJECTOR | Freq: Every day | SUBCUTANEOUS | 2 refills | Status: DC
Start: 2023-06-08 — End: 2023-07-24

## 2023-06-08 MED ORDER — AMOXICILLIN 875 MG PO TABS: 875.0000 mg | ORAL_TABLET | Freq: Two times a day (BID) | ORAL | 0 refills | Status: AC

## 2023-06-08 MED ORDER — FLUCONAZOLE 150 MG PO TABS
150.0000 mg | ORAL_TABLET | Freq: Every day | ORAL | 0 refills | Status: DC
Start: 2023-06-08 — End: 2023-06-29

## 2023-06-08 NOTE — Progress Notes (Signed)
Subjective   Patient ID: Evelyn Watts, female    DOB: 10/07/1975, 48 y.o.   MRN: [id number]  Chief Complaint  Patient presents with   Diabetes   Vaginitis    I wanted to know if Archie Patten could give me 2 pills instead of 1 Can I get some antibiotics for my tooth, and can I get some Ibuprofen.       Referring provider: No ref. provider found  Evelyn Watts is a 48 y.o. female with Past Medical History: No date: Allergies No date: Asthma No date: Diabetes mellitus without complication (HCC) No date: Essential hypertension   HPI  Patient presents today for a follow-up visit.  A1c in office today continues to stay elevated at 12.8.  Patient states that she is having trouble getting some of her medications.  Needs new prescription for Victoza sent to the pharmacy.  Patient complains of a yeast infection and states that she would like to request to Diflucan to be sent to the pharmacy.  Patient also complains of abscessed tooth that she needs to go to a dentist for but would like some antibiotics to help until she has an appointment.  Patient is also requesting ibuprofen for right shoulder pain.  Denies f/c/s, n/v/d, hemoptysis, PND, leg swelling Denies chest pain or edema     Allergies  Allergen Reactions   Latex Rash   Morphine And Codeine Rash   Tape Rash    Immunization History  Administered Date(s) Administered   Influenza, Seasonal, Injecte, Preservative Fre 06/08/2023   Influenza,inj,Quad PF,6+ Mos 02/14/2018    Tobacco History: Social History   Tobacco Use  Smoking Status Never  Smokeless Tobacco Never   Counseling given: Not Answered   Outpatient Encounter Medications as of 06/08/2023  Medication Sig   Accu-Chek Softclix Lancets lancets Use as instructed to monitor blood sugar. Dx: E11.65.   albuterol (VENTOLIN HFA) 108 (90 Base) MCG/ACT inhaler Inhale 2 puffs into the lungs every 4 (four) hours as needed for wheezing.   amoxicillin (AMOXIL) 875 MG  tablet Take 1 tablet (875 mg total) by mouth 2 (two) times daily for 10 days.   blood glucose meter kit and supplies KIT Dispense based on patient and insurance preference. Use up to four times daily as directed. (FOR ICD-9 250.00, 250.01).   Blood Glucose Monitoring Suppl DEVI 1 each by Does not apply route in the morning, at noon, and at bedtime. May substitute to any manufacturer covered by patient's insurance.   Ferrous Sulfate 90 (18 Fe) MG TABS Take 1 tablet by mouth daily.   folic acid (FOLVITE) 800 MCG tablet Take 0.5 tablets (400 mcg total) by mouth daily.   gabapentin (NEURONTIN) 300 MG capsule Take 1 capsule (300 mg total) by mouth 3 (three) times daily.   glucose blood (ACCU-CHEK GUIDE TEST) test strip Use as instructed to monitor blood sugar. Dx: E11.65.   ibuprofen (ADVIL) 600 MG tablet Take 1 tablet (600 mg total) by mouth every 8 (eight) hours as needed.   ibuprofen (ADVIL) 800 MG tablet Take 1 tablet (800 mg total) by mouth every 8 (eight) hours as needed (pain).   insulin glargine (LANTUS) 100 UNIT/ML Solostar Pen Inject 30 Units into the skin 2 (two) times daily.   Insulin Pen Needle (PEN NEEDLES 3/16") 31G X 5 MM MISC Use to inject insulin twice daily and liraglutide (Victoza) once daily. E11.65.   Insulin Syringes, Disposable, U-100 0.3 ML MISC 1 Dose by Does not apply route every 12 (  twelve) hours.   liraglutide (VICTOZA) 18 MG/3ML SOPN Inject 1.8 mg into the skin daily.   lisinopril (ZESTRIL) 20 MG tablet Take 1 tablet (20 mg total) by mouth daily.   metFORMIN (GLUCOPHAGE-XR) 500 MG 24 hr tablet Take 2 tablets (1,000 mg total) by mouth 2 (two) times daily with a meal.   rosuvastatin (CRESTOR) 20 MG tablet Take 1 tablet (20 mg total) by mouth daily.   Blood Pressure Monitoring (BLOOD PRESSURE CUFF) MISC Use as directed to monitor blood pressure (Patient not taking: Reported on 06/08/2023)   cetirizine (ZYRTEC) 10 MG tablet Take 1 tablet (10 mg total) by mouth daily. (Patient not  taking: Reported on 06/08/2023)   fluconazole (DIFLUCAN) 150 MG tablet Take 1 tablet (150 mg total) by mouth daily.   [DISCONTINUED] atorvastatin (LIPITOR) 20 MG tablet Take 1 tablet (20 mg total) by mouth daily. (Patient not taking: Reported on 02/09/2019)   [DISCONTINUED] fluconazole (DIFLUCAN) 150 MG tablet Take 1 tablet (150 mg total) by mouth daily. (Patient not taking: Reported on 06/08/2023)   [DISCONTINUED] liraglutide (VICTOZA) 18 MG/3ML SOPN Inject 1.8 mg into the skin daily. (Patient not taking: Reported on 06/08/2023)   No facility-administered encounter medications on file as of 06/08/2023.    Review of Systems  Review of Systems  Constitutional: Negative.   HENT: Negative.    Cardiovascular: Negative.   Gastrointestinal: Negative.   Allergic/Immunologic: Negative.   Neurological: Negative.   Psychiatric/Behavioral: Negative.       Objective:   BP (!) 135/91   Pulse 87   Temp 97.9 F (36.6 C)   Wt 265 lb 3.2 oz (120.3 kg)   SpO2 98%   BMI 36.99 kg/m   Wt Readings from Last 5 Encounters:  06/08/23 265 lb 3.2 oz (120.3 kg)  03/20/23 260 lb (117.9 kg)  03/08/23 262 lb 8 oz (119.1 kg)  02/08/23 255 lb (115.7 kg)  01/02/23 255 lb 11.7 oz (116 kg)     Physical Exam Vitals and nursing note reviewed.  Constitutional:      General: She is not in acute distress.    Appearance: She is well-developed.  Cardiovascular:     Rate and Rhythm: Normal rate and regular rhythm.  Pulmonary:     Effort: Pulmonary effort is normal.     Breath sounds: Normal breath sounds.  Neurological:     Mental Status: She is alert and oriented to person, place, and time.       Assessment & Plan:   Type 2 diabetes mellitus with hyperglycemia, with long-term current use of insulin (HCC) -     POCT glycosylated hemoglobin (Hb A1C) -     Liraglutide; Inject 1.8 mg into the skin daily.  Dispense: 9 mL; Refill: 2  Immunization due -     Flu vaccine trivalent PF, 6mos and  older(Flulaval,Afluria,Fluarix,Fluzone)  Yeast infection -     Fluconazole; Take 1 tablet (150 mg total) by mouth daily.  Dispense: 2 tablet; Refill: 0  Dental abscess -     Ibuprofen; Take 1 tablet (600 mg total) by mouth every 8 (eight) hours as needed.  Dispense: 30 tablet; Refill: 0 -     Amoxicillin; Take 1 tablet (875 mg total) by mouth 2 (two) times daily for 10 days.  Dispense: 20 tablet; Refill: 0     Return in about 3 months (around 09/05/2023).   Ivonne Andrew, NP 06/08/2023

## 2023-06-17 ENCOUNTER — Other Ambulatory Visit: Payer: Self-pay | Admitting: Nurse Practitioner

## 2023-06-17 DIAGNOSIS — M79671 Pain in right foot: Secondary | ICD-10-CM

## 2023-06-19 NOTE — Telephone Encounter (Signed)
 Please advise [facility]

## 2023-06-29 ENCOUNTER — Other Ambulatory Visit: Payer: Self-pay | Admitting: Nurse Practitioner

## 2023-06-29 DIAGNOSIS — B379 Candidiasis, unspecified: Secondary | ICD-10-CM

## 2023-06-29 DIAGNOSIS — K047 Periapical abscess without sinus: Secondary | ICD-10-CM

## 2023-06-29 NOTE — Telephone Encounter (Signed)
 Copied from CRM [id number]. Topic: Clinical - Medication Refill >> Jun 29, 2023 10:44 AM Gaetano Hawthorne wrote: Most Recent Primary Care Visit:  Provider: Ivonne Andrew  Department: [facility]  Visit Type: ACUTE  Date: 06/08/2023  Medication:   naproxen (NAPROSYN) tablet 500 mg ibuprofen (ADVIL) 600 MG tablet fluconazole (DIFLUCAN) 150 MG tablet  90 day supply.    Has the patient contacted their pharmacy? Yes (Agent: If no, request that the patient contact the pharmacy for the refill. If patient does not wish to contact the pharmacy document the reason why and proceed with request.) (Agent: If yes, when and what did the pharmacy advise?)  Is this the correct pharmacy for this prescription? No If no, delete pharmacy and type the correct one.  This is the patient's preferred pharmacy:   [facility] - [place], [place] - [address] [place] [address] Phone: [phone] Fax: [phone]   Has the prescription been filled recently? No  Is the patient out of the medication? No  Has the patient been seen for an appointment in the last year OR does the patient have an upcoming appointment? Yes  Can we respond through MyChart? Yes  Agent: Please be advised that Rx refills may take up to 3 business days. We ask that you follow-up with your pharmacy.

## 2023-06-29 NOTE — Telephone Encounter (Signed)
 Please advise [facility]

## 2023-06-30 MED ORDER — NAPROXEN 500 MG PO TABS: 500.0000 mg | ORAL_TABLET | Freq: Two times a day (BID) | ORAL | 2 refills | Status: DC

## 2023-06-30 MED ORDER — IBUPROFEN 600 MG PO TABS: 600.0000 mg | ORAL_TABLET | Freq: Three times a day (TID) | ORAL | 0 refills | Status: DC | PRN

## 2023-06-30 MED ORDER — FLUCONAZOLE 150 MG PO TABS: 150.0000 mg | ORAL_TABLET | Freq: Every day | ORAL | 0 refills | Status: DC

## 2023-07-07 ENCOUNTER — Telehealth: Payer: Self-pay

## 2023-07-07 NOTE — Telephone Encounter (Signed)
 Please see note below, routing to clinic for follow up.  Copied from CRM [id number]. Topic: Clinical - Prescription Issue >> Jul 07, 2023  4:26 PM Nyra Capes wrote: Reason for CRM: Shanda Bumps with [facility] [phone] is needing clarification on these medications for the patient. Rx was written on 06/30/23  ibuprofen (ADVIL) 600 MG tablet  naproxen (NAPROSYN) 500 MG tablet

## 2023-07-10 NOTE — Telephone Encounter (Signed)
 Sent to [facility] for help. KH

## 2023-07-11 ENCOUNTER — Other Ambulatory Visit (HOSPITAL_COMMUNITY): Payer: Self-pay

## 2023-07-11 ENCOUNTER — Other Ambulatory Visit: Payer: Self-pay

## 2023-07-11 DIAGNOSIS — K047 Periapical abscess without sinus: Secondary | ICD-10-CM

## 2023-07-11 DIAGNOSIS — M79671 Pain in right foot: Secondary | ICD-10-CM

## 2023-07-11 MED ORDER — IBUPROFEN 600 MG PO TABS: 600.0000 mg | ORAL_TABLET | Freq: Three times a day (TID) | ORAL | 0 refills | Status: DC | PRN

## 2023-07-11 NOTE — Telephone Encounter (Signed)
 Pt is only taken the ibuprofen. She will need a  refill on it as well. KH

## 2023-07-11 NOTE — Telephone Encounter (Signed)
 Please advise [facility]

## 2023-07-11 NOTE — Progress Notes (Signed)
 07/11/2023 Name: Evelyn Watts MRN: [id number] DOB: October 01, 1975  Chief Complaint  Patient presents with   Diabetes   Hypertension   Hyperlipidemia   Medication Management    [name] is a 48 y.o. year old female who presented for a telephone visit.   They were referred to the pharmacist by their PCP for assistance in managing diabetes, hypertension, and hyperlipidemia.   Called patient for visit scheduled this evening and ~20 minutes in to the visit we were disconnected. Attempted to call patient back x2 with no answer. Left VM with call back number for patient to return my call. Will attempt to schedule follow up.  Subjective:  Care Team: Primary Care Provider: Ivonne Andrew, NP ; Next Scheduled Visit: 09/06/23  Medication Access/Adherence  Current Pharmacy:  [facility] #[id number] [place], [place] - [address] [place] [address] Phone: [phone] Fax: [phone]  [facility] - [place], [place] - [address] [place] [address] Phone: [phone] Fax: [phone]  [facility] - [place], [place] - [address] [place] [address] Phone: [phone] Fax: [phone]   Patient reports affordability concerns with their medications: No  Patient reports access/transportation concerns to their pharmacy: Yes - still using multiple pharmacies Patient reports adherence concerns with their medications: Yes    Diabetes:  Current medications: Lantus 30 units twice daily, Victoza 1.8 mg daily, metformin XR 500 mg - 2 tablets in the morning and 1 tablet at night Medications tried in the past: dulaglutide (upset stomach, nausea),   Patient reports she is tolerating XR metformin much better. Denies any GI issues. Denies missed doses of medications. Says that she prefers 3 month supplies of medications, but [facility] filled Victoza  for a 1 month supply.   Using glucometer; testing in the morning, after meals, and at bedtime  AM FBG: ~200 After meals ~30 minutes: 230-240s Before Bed (~8 PM which is 1-1.5 hours after dinner): 100s   Patient denies hypoglycemic s/sx including dizziness, shakiness, sweating. Patient denies hyperglycemic symptoms including polyuria, polydipsia, polyphagia, nocturia, neuropathy, blurred vision.  Current meal patterns: 3 meals/day - Breakfast: sausage, eggs, grits, oatmeal - Lunch: hamburger with no bread, just lettuce, fruit - Supper: chicken, small portion of rice, greens - Snacks: handful of chips occasionally - Drinks: glass of water with every meal  Current physical activity: walking in the mornings almost every day for 25-30 minutes   Hypertension:  Current medications: lisinopril 20 mg daily   Hyperlipidemia/ASCVD Risk Reduction  Current lipid lowering medications: rosuvastatin 20 mg daily  The 10-year ASCVD risk score (Arnett DK, et al., 2019) is: 4.7%   Values used to calculate the score:     Age: 45 years     Sex: Female     Is Non-Hispanic African American: No     Diabetic: Yes     Tobacco smoker: No     Systolic Blood Pressure: 135 mmHg     Is BP treated: Yes     HDL Cholesterol: 41 mg/dL     Total Cholesterol: 230 mg/dL   Objective:  Lab Results  Component Value Date   HGBA1C 12.8 (A) 06/08/2023    Lab Results  Component Value Date   CREATININE 0.67 03/20/2023   BUN 18 03/20/2023   NA 131 (L) 03/20/2023   K 4.9 03/20/2023   CL 95 (L) 03/20/2023   CO2 20 03/20/2023  Lab Results  Component Value Date   CHOL 230 (H) 11/30/2022   HDL 41 11/30/2022   LDLCALC 110 (H) 11/30/2022   TRIG 456 (H) 11/30/2022   CHOLHDL 5.6 (H) 11/30/2022    Medications Reviewed Today     Reviewed by Vela Prose, RPH (Pharmacist) on 07/11/23 at 1859  Med List Status: <None>   Medication Order Taking? Sig Documenting Provider Last Dose Status Informant   Accu-Chek Softclix Lancets lancets [id number]  Use as instructed to monitor blood sugar. Dx: E11.65. Ivonne Andrew, NP  Active   albuterol (VENTOLIN HFA) 108 (90 Base) MCG/ACT inhaler [id number] Yes Inhale 2 puffs into the lungs every 4 (four) hours as needed for wheezing. Ivonne Andrew, NP Taking Active    Patient not taking:   Discontinued 02/12/20 0848 blood glucose meter kit and supplies KIT [id number]  Dispense based on patient and insurance preference. Use up to four times daily as directed. (FOR ICD-9 250.00, 250.01). Domenick Gong, MD  Active   Blood Glucose Monitoring Suppl DEVI [id number]  1 each by Does not apply route in the morning, at noon, and at bedtime. May substitute to any manufacturer covered by patient's insurance. Ivonne Andrew, NP  Active   Blood Pressure Monitoring (BLOOD PRESSURE CUFF) MISC [id number]  Use as directed to monitor blood pressure  Patient not taking: Reported on 06/08/2023   Ivonne Andrew, NP  Active   cetirizine (ZYRTEC) 10 MG tablet [id number] Yes Take 1 tablet (10 mg total) by mouth daily. Bing Neighbors, NP Taking Active   Ferrous Sulfate 90 (18 Fe) MG TABS [id number] Yes Take 1 tablet by mouth daily. Ivonne Andrew, NP Taking Active            Med Note Alycia Patten Apr 09, 2023  1:03 PM) Ran out  fluconazole (DIFLUCAN) 150 MG tablet [id number] Yes Take 1 tablet (150 mg total) by mouth daily. Ivonne Andrew, NP Taking Active   folic acid (FOLVITE) 800 MCG tablet [id number] Yes Take 0.5 tablets (400 mcg total) by mouth daily. Ivonne Andrew, NP Taking Active   gabapentin (NEURONTIN) 300 MG capsule [id number] Yes TAKE 1 CAPSULE THREE TIMES DAILY Ivonne Andrew, NP Taking Active   glucose blood (ACCU-CHEK GUIDE TEST) test strip [id number]  Use as instructed to monitor blood sugar. Dx: E11.65. Ivonne Andrew, NP  Active   ibuprofen (ADVIL) 600 MG tablet [id number] Yes Take 1 tablet (600 mg total) by mouth every 8 (eight) hours as needed.  Donell Beers, FNP Taking Active   insulin glargine (LANTUS) 100 UNIT/ML Solostar Pen [id number] Yes Inject 30 Units into the skin 2 (two) times daily. Ivonne Andrew, NP Taking Active   Insulin Pen Needle (PEN NEEDLES 3/16") 31G X 5 MM MISC [id number]  Use to inject insulin twice daily and liraglutide (Victoza) once daily. E11.65. Ivonne Andrew, NP  Active   Insulin Syringes, Disposable, U-100 0.3 ML MISC [id number]  1 Dose by Does not apply route every 12 (twelve) hours. Bing Neighbors, NP  Active   liraglutide (VICTOZA) 18 MG/3ML SOPN [id number] Yes Inject 1.8 mg into the skin daily. Ivonne Andrew, NP Taking Active   lisinopril (ZESTRIL) 20 MG tablet [id number] Yes Take 1 tablet (20 mg total) by mouth daily. Ivonne Andrew, NP Taking Active   metFORMIN (GLUCOPHAGE-XR) 500 MG 24 hr tablet [id number] Yes Take 2 tablets (1,000 mg total) by mouth 2 (two) times daily  with a meal. Ivonne Andrew, NP Taking Active   rosuvastatin (CRESTOR) 20 MG tablet [id number] Yes Take 1 tablet (20 mg total) by mouth daily. Ivonne Andrew, NP Taking Active               Assessment/Plan:   Call was disconnected so unable to discuss hypertension and hyperlipidemia or communicate medication recommendations. Will attempt to schedule another follow up to continue visit.  Diabetes: - Currently uncontrolled with last A1c 12.8% on 06/08/23 and patient reported BG readings are elevated. She reports adherence to Victoza and Lantus as prescribed, although has only been taking metformin XR 1000 mg in AM and 500 mg in PM instead of 1000 mg BID. Has been filling Lantus and metformin from [facility] mail order for 90 day supplies. Still getting Victoza from [facility] and says she only received a 30 day supply last time. Would recommend switching this to [facility] mail order to be consistent with her other medications as well, but our phone call was disconnected and unable to discuss. Patient would benefit from  switching Victoza to more potent GLP-1 like Mounjaro for glycemic control and weight loss benefit. Patient would also benefit from switching Lantus BID to longer acting, more concentrated [place] for simplification of regimen and better glycemic control. Also would encouraged patient to take metformin XR 1000 mg BID as prescribed. Will plan to discuss these changes when able to schedule another visit.   Hypertension: - Currently uncontrolled with last office BP 135/91, above goal <130/80.  - Need to follow up to see if she received home BP monitor and assess adherence to lisinopril, but appears she has been filling 90 day supplies from [facility] mail order pharmacy. May need additional therapy.   Hyperlipidemia/ASCVD Risk Reduction: - Currently uncontrolled with last LDL 110 mg/dL, above goal <40 mg/dL and likely underestimated given TG elevated at 456 mg/dL. Lipid panel not been rechecked since patient initiated rosuvastatin 20 mg in August 2024. Need to follow up to assess adherence, but appears she has been filling medication. - Recommend to recheck lipid panel at next PCP visit   Medication Management: - Need to follow up to see if she was able to purchase a TID pill box to help organize medications and improve adherence.   Follow Up Plan: appt on 07/21/23 for arm pain, PCP visit on 09/06/23, will send message to care guide to schedule PharmD follow up   Jarrett Ables, PharmD PGY-1 Pharmacy Resident

## 2023-07-11 NOTE — Telephone Encounter (Signed)
Done Kh 

## 2023-07-14 ENCOUNTER — Other Ambulatory Visit: Payer: Self-pay

## 2023-07-17 ENCOUNTER — Other Ambulatory Visit: Payer: Self-pay

## 2023-07-24 ENCOUNTER — Other Ambulatory Visit: Payer: Self-pay | Admitting: Nurse Practitioner

## 2023-07-24 ENCOUNTER — Other Ambulatory Visit: Payer: Self-pay

## 2023-07-24 DIAGNOSIS — Z794 Long term (current) use of insulin: Secondary | ICD-10-CM

## 2023-07-24 MED ORDER — LIRAGLUTIDE 18 MG/3ML ~~LOC~~ SOPN: 1.8000 mg | PEN_INJECTOR | Freq: Every day | SUBCUTANEOUS | 2 refills | Status: DC

## 2023-08-02 ENCOUNTER — Telehealth: Payer: Self-pay | Admitting: *Deleted

## 2023-08-02 NOTE — Progress Notes (Unsigned)
 Complex Care Management Care Guide Note  08/02/2023 Name: Evelyn Watts MRN: [id number] DOB: Apr 21, 1976  Evelyn Watts is a 48 y.o. year old female who is a primary care patient of Ivonne Andrew, NP and is actively engaged with the care management team. I reached out to Vonzell Schlatter by phone today to assist with re-scheduling  with the Pharmacist.  Follow up plan: Unsuccessful telephone outreach attempt made. A HIPAA compliant phone message was left for the patient providing contact information and requesting a return call.  Gwenevere Ghazi  [facility], [facility] Guide  Direct Dial: [phone]  Fax [phone]

## 2023-08-03 NOTE — Progress Notes (Signed)
 Complex Care Management Care Guide Note  08/03/2023 Name: [name] MRN: [id number] DOB: [birth year]  [name] is a 48 y.o. year old female who is a primary care patient of Ivonne Andrew, NP and is actively engaged with the care management team. Vonzell Schlatter called by phone today to assist with re-scheduling  with the Pharmacist.  Follow up plan: Telephone appointment with complex care management team member scheduled for:  4/22 at 10:30 AM  [facility]  [facility], [facility] Guide  Direct Dial: [phone]  Fax [phone]

## 2023-08-15 ENCOUNTER — Other Ambulatory Visit: Payer: Self-pay

## 2023-08-15 ENCOUNTER — Ambulatory Visit: Payer: Self-pay | Admitting: Nurse Practitioner

## 2023-08-15 ENCOUNTER — Other Ambulatory Visit (HOSPITAL_COMMUNITY): Payer: Self-pay

## 2023-08-15 DIAGNOSIS — Z794 Long term (current) use of insulin: Secondary | ICD-10-CM

## 2023-08-15 DIAGNOSIS — I10 Essential (primary) hypertension: Secondary | ICD-10-CM

## 2023-08-15 MED ORDER — BLOOD PRESSURE CUFF MISC
0 refills | Status: AC
Start: 2023-08-15 — End: ?

## 2023-08-15 MED ORDER — LIRAGLUTIDE 18 MG/3ML ~~LOC~~ SOPN: 1.8000 mg | PEN_INJECTOR | Freq: Every day | SUBCUTANEOUS | 3 refills | Status: DC

## 2023-08-15 MED ORDER — FREESTYLE LIBRE 3 PLUS SENSOR MISC: 3 refills | Status: DC

## 2023-08-15 NOTE — Progress Notes (Signed)
 08/15/2023 Name: Evelyn Watts MRN: [id number] DOB: 10/15/1975  Chief Complaint  Patient presents with   Diabetes   Hypertension   Hyperlipidemia    Evelyn Watts is a 48 y.o. year old female who presented for a telephone visit.   They were referred to the pharmacist by their PCP for assistance in managing diabetes, hypertension, and hyperlipidemia.   Subjective: Reports she is doing well and has current supplies of all her medications. Reports that she has Victoza  (1.8 mg) remaining, despite last fill 06/09/23.Reports that she is interested in CGM - [employer]. She has a friend that uses this device. Reports that she continues to have intermittent yeast infections related to when she gets her menstrual period. She would like to have a standing order of fluconazole . She also reports that she continues to have issues with dental pain, and has been recently prescribed an antibiotic by Lynnie Saucier (PCP), and she is wondering if this can be refilled. She thinks that penicillin  works better than amoxicillin .   Care Team: Primary Care Provider: Jerrlyn Morel, NP ; Next Scheduled Visit: 09/06/23  Medication Access/Adherence  Current Pharmacy:  [facility] #[id number] [place], [place] - [address] [place] [address] Phone: [phone] Fax: [phone]  [facility] - [address], [place] - [address] [place] [address] Phone: [phone] Fax: [phone]  [facility] - [place], [place] - [address] [place] [address] Phone: [phone] Fax: [phone]   Patient reports affordability concerns with their medications: No  Patient reports access/transportation concerns to their pharmacy: Yes - still using multiple pharmacies Patient reports adherence concerns with their medications: Yes - she has an in-home aid that comes to help her with  medications and other ADLs every day except Sundays.    Diabetes:  Current medications: Lantus  30 units twice daily, Victoza  1.8 mg daily, metformin  XR 500 mg - 2 tablets in the morning and 1 tablet at night Medications tried in the past: dulaglutide (upset stomach, nausea),   Patient reports she is tolerating XR metformin  much better. Denies any GI issues. Denies missed doses of medications.   Using glucometer; testing in the morning, after meals, and at bedtime  This AM after eating: BG 200 Reports FBG ~140 mg/dL Before Bed (~8 PM which is 1-1.5 hours after dinner): 210-215 after eating Denies readings > 250 mg/dL   She reports that she is interested in using Freestyle Libre 3 CGM to monitor BG - test claim showed FL3+ $0 for 90ds through insurance. She has an Android phone and could use the [facility] app to monitor her sugars.   Patient denies hypoglycemic s/sx including dizziness, shakiness, sweating. Patient denies hyperglycemic symptoms including polyuria, polydipsia, polyphagia, nocturia, neuropathy, blurred vision.  Current meal patterns: 3 meals/day - reports she is mainly baking and boiling her foods. Eating more vegetables - cabbage, stir fry.  - Breakfast: sausage, eggs, grits, oatmeal - Lunch: hamburger with no bread, just lettuce, fruit - Supper: chicken, small portion of rice, greens - Snacks: handful of chips occasionally - Drinks: glass of water  with every meal  Current physical activity: walking in the mornings almost every day for 25-30 minutes with aid. Limited by pain with bone spurs.  Hypertension:  Current medications: lisinopril  20 mg daily\  Hyperlipidemia/ASCVD Risk Reduction  Current lipid lowering medications: rosuvastatin  20 mg daily  The 10-year ASCVD risk score (Arnett DK, et al.,  2019) is: 4.7%   Values used to calculate the score:     Age: 41 years     Sex: Female     Is Non-Hispanic African American: No     Diabetic: Yes     Tobacco smoker: No      Systolic Blood Pressure: 135 mmHg     Is BP treated: Yes     HDL Cholesterol: 41 mg/dL     Total Cholesterol: 230 mg/dL   Objective:  Lab Results  Component Value Date   HGBA1C 12.8 (A) 06/08/2023    Lab Results  Component Value Date   CREATININE 0.67 03/20/2023   BUN 18 03/20/2023   NA 131 (L) 03/20/2023   K 4.9 03/20/2023   CL 95 (L) 03/20/2023   CO2 20 03/20/2023    Lab Results  Component Value Date   CHOL 230 (H) 11/30/2022   HDL 41 11/30/2022   LDLCALC 110 (H) 11/30/2022   TRIG 456 (H) 11/30/2022   CHOLHDL 5.6 (H) 11/30/2022    Medications Reviewed Today     Reviewed by Adra Alanis, RPH (Pharmacist) on 08/15/23 at 1142  Med List Status: <None>   Medication Order Taking? Sig Documenting Provider Last Dose Status Informant  Accu-Chek Softclix Lancets lancets [id number]  Use as instructed to monitor blood sugar. Dx: E11.65. Jerrlyn Morel, NP  Active   albuterol  (VENTOLIN  HFA) 108 (90 Base) MCG/ACT inhaler [id number]  Inhale 2 puffs into the lungs every 4 (four) hours as needed for wheezing. Jerrlyn Morel, NP  Active   Blood Pressure Monitoring (BLOOD PRESSURE CUFF) MISC [id number] No Use as directed to monitor blood pressure  Patient not taking: Reported on 08/15/2023   Jerrlyn Morel, NP Not Taking Active            Med Note Francetta Innocent, Flavio Huguenin   Tue Aug 15, 2023 11:41 AM) Has not recieved  cetirizine  (ZYRTEC ) 10 MG tablet [id number]  Take 1 tablet (10 mg total) by mouth daily. Buena Carmine, NP  Active   Ferrous Sulfate  90 (18 Fe) MG TABS [id number]  Take 1 tablet by mouth daily. Jerrlyn Morel, NP  Active            Med Note Petrina Bras Apr 09, 2023  1:03 PM) Ran out  fluconazole  (DIFLUCAN ) 150 MG tablet [id number] No Take 1 tablet (150 mg total) by mouth daily.  Patient not taking: Reported on 08/15/2023   Jerrlyn Morel, NP Not Taking Active   folic acid  (FOLVITE ) 800 MCG tablet [id number]  Take 0.5 tablets (400 mcg total) by mouth daily.  Jerrlyn Morel, NP  Active   gabapentin  (NEURONTIN ) 300 MG capsule [id number] Yes TAKE 1 CAPSULE THREE TIMES DAILY Nichols, Tonya S, NP Taking Active   glucose blood (ACCU-CHEK GUIDE TEST) test strip [id number]  Use as instructed to monitor blood sugar. Dx: E11.65. Jerrlyn Morel, NP  Active   ibuprofen  (ADVIL ) 600 MG tablet [id number]  Take 1 tablet (600 mg total) by mouth every 8 (eight) hours as needed. Paseda, Folashade R, FNP  Active   insulin  glargine (LANTUS ) 100 UNIT/ML Solostar Pen [id number] Yes Inject 30 Units into the skin 2 (two) times daily. Jerrlyn Morel, NP Taking Active   Insulin  Pen Needle (PEN NEEDLES 3/16") 31G X 5 MM MISC [id number]  Use to inject insulin  twice daily and liraglutide  (Victoza ) once daily. E11.65. Jerrlyn Morel, NP  Active   liraglutide  (VICTOZA )  18 MG/3ML SOPN [id number] Yes Inject 1.8 mg into the skin daily. Jerrlyn Morel, NP Taking Active   lisinopril  (ZESTRIL ) 20 MG tablet [id number] Yes Take 1 tablet (20 mg total) by mouth daily. Jerrlyn Morel, NP Taking Active   metFORMIN  (GLUCOPHAGE -XR) 500 MG 24 hr tablet [id number] Yes Take 2 tablets (1,000 mg total) by mouth 2 (two) times daily with a meal. Jerrlyn Morel, NP Taking Active   rosuvastatin  (CRESTOR ) 20 MG tablet [id number] Yes Take 1 tablet (20 mg total) by mouth daily. Jerrlyn Morel, NP Taking Active               Assessment/Plan:   Diabetes: - Currently uncontrolled with last A1c 12.8% on 06/08/23 and patient reported BG readings are elevated. She reports adherence to Victoza , Lantus , and metformin  as prescribed - however dispense history for Victoza  suggests nonadherence. Will send 3 mo supply of Victoza  to mail order pharmacy to assist with adherence. Patient would benefit from switching Victoza  to more potent GLP-1 like Mounjaro for glycemic control and weight loss benefit. Patient would also benefit from switching Lantus  BID to longer acting, more concentrated [place] for  simplification of regimen and better glycemic control. Difficult to make multiple changes in one visit, so today will focus on obtaining more glucose data and setting patient up with FL3+ CGM.  - Reviewed long term cardiovascular and renal outcomes of uncontrolled blood sugar - Reviewed goal A1c, goal fasting, and goal 2 hour post prandial glucose - Reviewed dietary modifications including drinking more water , focusing on intake of protein and nonstarchy vegetables - Reviewed lifestyle modifications including: increasing physical activity. Commended for daily walking.  - Recommend to continue Victoza  1.8 mg daily. Consider switching to Mounjaro 2.5 mg at follow-up ($0 for 30ds).  - Recommend to continue Lantus  30 units BID. Consider switching to Tresiba at follow-up.   - Recommend to continue metformin  XR 1000 mg BID - Recommend to check glucose continuously with FL3+ CGM. Provided patient instructions on downloading app and applying sensor. Test claim showed $0 copay for 90 day supply. Sent prescription to preferred pharmacy per standing order. Will provide patent with clinic code to share data at follow-up.  - Recheck A1C at PCP follow-up   Hypertension: - Currently uncontrolled with last office BP 135/91, above goal <130/80.  - Continue lisinopril  20 mg daily - Reordered home BP cuff to mail order pharmacy. Instructed patient to monitor at home to determine need for additional therapy.    Hyperlipidemia/ASCVD Risk Reduction: - Currently uncontrolled with last LDL 110 mg/dL, above goal <01 mg/dL and likely underestimated given TG elevated at 456 mg/dL. Lipid panel not been rechecked since patient initiated rosuvastatin  20 mg in August 2024. Need to follow up to assess adherence, but appears she has been filling medication. - Recommend to recheck lipid panel at next PCP visit   Medication Management: - Encouraged patient to obtain TID pill box   Follow Up Plan: PCP 09/06/23, Pharmacist  telephone 09/10/23   Arthea Larsson, PharmD PGY1 Pharmacy Resident

## 2023-08-16 ENCOUNTER — Other Ambulatory Visit: Payer: Self-pay

## 2023-08-24 ENCOUNTER — Other Ambulatory Visit: Payer: Self-pay | Admitting: Nurse Practitioner

## 2023-08-24 MED ORDER — SEMAGLUTIDE(0.25 OR 0.5MG/DOS) 2 MG/3ML ~~LOC~~ SOPN: 0.2500 mg | PEN_INJECTOR | SUBCUTANEOUS | 2 refills | Status: DC

## 2023-08-28 ENCOUNTER — Other Ambulatory Visit: Payer: Self-pay

## 2023-09-06 ENCOUNTER — Ambulatory Visit: Payer: Self-pay | Admitting: Nurse Practitioner

## 2023-09-10 ENCOUNTER — Other Ambulatory Visit: Payer: Self-pay

## 2023-09-10 DIAGNOSIS — E1165 Type 2 diabetes mellitus with hyperglycemia: Secondary | ICD-10-CM

## 2023-09-10 NOTE — Progress Notes (Signed)
 09/10/2023 Name: Evelyn Watts MRN: [id number] DOB: 1975-11-29  Chief Complaint  Patient presents with   Diabetes    Evelyn Watts is a 48 y.o. year old female who presented for a telephone visit.   They were referred to the pharmacist by their PCP for assistance in managing diabetes, hypertension, and hyperlipidemia.   Subjective: Reports she is doing well and has current supplies of all her medications. She continues to report that she has supply of Victoza  remaining despite last fill in Feb for 30 day supply. I sent a new prescrition for Victoza  to her mail order pharmacy at our last telephone appt on 08/15/23, along with Freestyle Libre CGMs, and neither of these have been dispensed from the pharmacy. Noted that patient's PCP entered an order for Ozempic  on 08/24/23. Patient was not aware that this prescription had been sent in and had not started taking this medication. Patient reports that she has missed her recent appointments due to issues with transportation. She was previously scheduling rides with [employer], but reports that this has not been working recently.   Care Team: Primary Care Provider: Jerrlyn Morel, NP ; Next Scheduled Visit: 09/12/23 (with Enid Harry, due to no-show with Tonya on 09/06/23)  Medication Access/Adherence  Current Pharmacy:  [facility] - [place], [place] - [address] [place] [address] Phone: [phone] Fax: [phone]   Patient reports affordability concerns with their medications: No  Patient reports access/transportation concerns to their pharmacy: Yes - still using multiple pharmacies Patient reports adherence concerns with their medications: Yes - she has an in-home aid that comes to help her with medications and other ADLs every day except Sundays.    Diabetes:  Current medications: Lantus  30 units twice daily, Victoza  1.8 mg daily, metformin  XR 500 mg - 2 tablets in the morning and 1  tablet at night Medications tried in the past: dulaglutide (upset stomach, nausea)  Patient reports she is tolerating XR metformin  much better. Denies any GI issues. Denies missed doses of medications.   Using glucometer; testing in the morning, after meals, and at bedtime  Last reading before bed yesterday: 199 mg/dL She reports that she does not have additional readings to share and her glucometer is not accessible right now, but they have been ranging from 99-199 mg/dL.  She reports that she is still interested in using Freestyle Libre 3 CGM to monitor BG - test claim showed FL3+ $0 for 90ds through insurance. She has an Designer, industrial/product and could use the [place] app to monitor her sugars.   Patient denies hypoglycemic s/sx including dizziness, shakiness, sweating. Patient denies hyperglycemic symptoms including polyuria, polydipsia, polyphagia, nocturia, neuropathy, blurred vision.  Current meal patterns: 3 meals/day - reports she is mainly baking and boiling her foods. Eating more vegetables - cabbage, stir fry.  - Breakfast: sausage, eggs, grits, oatmeal - Lunch: hamburger with no bread, just lettuce, fruit - Supper: chicken, small portion of rice, greens - Snacks: handful of chips occasionally - Drinks: glass of water  with every meal  Current physical activity: walking in the mornings almost every day for 25-30 minutes with aid. Limited by pain with bone spurs.  Hypertension:  Current medications: lisinopril  20 mg daily (current supply per fill hx)  Hyperlipidemia/ASCVD Risk Reduction  Current lipid lowering medications: rosuvastatin  20 mg daily (current supply per fill hx)  The 10-year ASCVD risk score (Arnett DK, et al., 2019) is: 4.7%   Values used to calculate the score:  Age: 75 years     Sex: Female     Is Non-Hispanic African American: No     Diabetic: Yes     Tobacco smoker: No     Systolic Blood Pressure: 135 mmHg     Is BP treated: Yes     HDL Cholesterol: 41  mg/dL     Total Cholesterol: 230 mg/dL   Objective:  Lab Results  Component Value Date   HGBA1C 12.8 (A) 06/08/2023    Lab Results  Component Value Date   CREATININE 0.67 03/20/2023   BUN 18 03/20/2023   NA 131 (L) 03/20/2023   K 4.9 03/20/2023   CL 95 (L) 03/20/2023   CO2 20 03/20/2023    Lab Results  Component Value Date   CHOL 230 (H) 11/30/2022   HDL 41 11/30/2022   LDLCALC 110 (H) 11/30/2022   TRIG 456 (H) 11/30/2022   CHOLHDL 5.6 (H) 11/30/2022    Medications Reviewed Today     Reviewed by Adra Alanis, RPH (Pharmacist) on 09/10/23 at 1201  Med List Status: <None>   Medication Order Taking? Sig Documenting Provider Last Dose Status Informant  Accu-Chek Softclix Lancets lancets [id number]  Use as instructed to monitor blood sugar. Dx: E11.65. Jerrlyn Morel, NP  Active   albuterol  (VENTOLIN  HFA) 108 (90 Base) MCG/ACT inhaler [id number]  Inhale 2 puffs into the lungs every 4 (four) hours as needed for wheezing. Jerrlyn Morel, NP  Active   Blood Pressure Monitoring (BLOOD PRESSURE CUFF) MISC [id number]  Use as directed to monitor blood pressure  Patient not taking: Reported on 08/15/2023   Jerrlyn Morel, NP  Active            Med Note Francetta Innocent, Tray Klayman P   Tue Aug 15, 2023 11:41 AM) Has not recieved  cetirizine  (ZYRTEC ) 10 MG tablet [id number]  Take 1 tablet (10 mg total) by mouth daily. Buena Carmine, NP  Active   Continuous Glucose Sensor (FREESTYLE LIBRE 3 PLUS SENSOR) [place] [id number] No Change sensor every 15 days. Use to check glucose continuously using the [employer] 3 app.  Patient not taking: Reported on 09/10/2023   Jerrlyn Morel, NP Not Taking Active            Med Note Petrina Bras Sep 10, 2023 10:30 AM) Has not recieved  Ferrous Sulfate  90 (18 Fe) MG TABS [id number]  Take 1 tablet by mouth daily. Jerrlyn Morel, NP  Active            Med Note Petrina Bras Apr 09, 2023  1:03 PM) Ran out  fluconazole  (DIFLUCAN ) 150 MG tablet  [id number]  Take 1 tablet (150 mg total) by mouth daily.  Patient not taking: Reported on 08/15/2023   Jerrlyn Morel, NP  Active   folic acid  (FOLVITE ) 800 MCG tablet [id number]  Take 0.5 tablets (400 mcg total) by mouth daily. Jerrlyn Morel, NP  Active   gabapentin  (NEURONTIN ) 300 MG capsule [id number] Yes TAKE 1 CAPSULE THREE TIMES DAILY Nichols, Tonya S, NP Taking Active   glucose blood (ACCU-CHEK GUIDE TEST) test strip [id number]  Use as instructed to monitor blood sugar. Dx: E11.65. Jerrlyn Morel, NP  Active   ibuprofen  (ADVIL ) 600 MG tablet [id number]  Take 1 tablet (600 mg total) by mouth every 8 (eight) hours as needed. Paseda, Folashade R, FNP  Active   insulin  glargine (LANTUS ) 100 UNIT/ML Solostar Pen [id number]  Inject 30 Units into the skin 2 (two) times daily. Jerrlyn Morel, NP  Active   Insulin  Pen Needle (PEN NEEDLES 3/16") 31G X 5 MM MISC [id number] Yes Use to inject insulin  twice daily and liraglutide  (Victoza ) once daily. E11.65. Jerrlyn Morel, NP Taking Active   liraglutide  (VICTOZA ) 18 MG/3ML SOPN [id number] Yes Inject 1.8 mg into the skin daily. Jerrlyn Morel, NP Taking Active   lisinopril  (ZESTRIL ) 20 MG tablet [id number] Yes Take 1 tablet (20 mg total) by mouth daily. Jerrlyn Morel, NP Taking Active   metFORMIN  (GLUCOPHAGE -XR) 500 MG 24 hr tablet [id number] Yes Take 2 tablets (1,000 mg total) by mouth 2 (two) times daily with a meal. Jerrlyn Morel, NP Taking Active   rosuvastatin  (CRESTOR ) 20 MG tablet [id number] Yes Take 1 tablet (20 mg total) by mouth daily. Jerrlyn Morel, NP Taking Active   Semaglutide ,0.25 or 0.5MG /DOS, 2 MG/3ML SOPN [id number] No Inject 0.25 mg into the skin once a week.  Patient not taking: Reported on 09/10/2023   Jerrlyn Morel, NP Not Taking Active            Med Note Petrina Bras Sep 10, 2023 10:30 AM) Patient was unaware this was prescribed              Assessment/Plan:   Diabetes: - Currently uncontrolled with  last A1c 12.8% on 06/08/23 and patient reported BG readings are elevated, but improved. Patient is due for a repeat A1C, but has not been able to attend appointments due to transportation barriers. She reports adherence to Victoza , Lantus , and metformin  as prescribed - however dispense history for Victoza  suggests nonadherence. It does not appear that medication was filled after I sent a new prescription to [facility] pharmacy last month. Noted that PCP sent Rx for Ozempic  a couple weeks ago to [facility], but patient was unaware of this and has not picked this up. She is willing to switch from Victoza  to Ozempic  if that is what her PCP recommends. Will communicate with her PCP to coordinate fills at correct pharmacy. Will also engage social work to address transportation concerns.   - Reviewed long term cardiovascular and renal outcomes of uncontrolled blood sugar - Reviewed goal A1c, goal fasting, and goal 2 hour post prandial glucose - Reviewed dietary modifications including drinking more water , focusing on intake of protein and nonstarchy vegetables - Reviewed lifestyle modifications including: increasing physical activity. Commended for daily walking.  - Recommend to continue Victoza  1.8 mg daily. Consider switching to Ozempic  0.5 mg subcutaneous once weekly. Will communicate with PCP to place orders to correct pharmacy. Patient will need to be reeducated on Ozempic  administration and dosing if we are making this switch.  - Recommend to continue Lantus  30 units BID. Consider switching to Tresiba at follow-up.   - Recommend to continue metformin  XR 1000 mg in the AM and 500 mg in the PM - Recommend to check glucose continuously with FL3+ CGM. Provided patient instructions on downloading app and applying sensor at appt on 08/15/23. Test claim showed $0 copay for 90 day supply. Previously sent prescription to preferred pharmacy per standing order. Attempted to contact pharmacy today to follow-up on shipment,  but they were not open. Will continue to follow.  - Recheck A1C at PCP follow-up   Hypertension: - Currently uncontrolled with last office BP 135/91, above goal <130/80. Was not able to discuss blood pressure today, as patient was with someone while she was talking  on the phone who was encouraging her to hang up and follow-up with her PCP.  - Continue lisinopril  20 mg daily - Instructed patient to monitor at home to determine need for additional therapy at previous visit.    Hyperlipidemia/ASCVD Risk Reduction: - Currently uncontrolled with last LDL 110 mg/dL, above goal <56 mg/dL and likely underestimated given TG elevated at 456 mg/dL. Lipid panel not been rechecked since patient initiated rosuvastatin  20 mg in August 2024. Need to follow up to assess adherence, but appears she has been filling medication. - Continue rosuvastatin  20 mg daily - Recommend to recheck lipid panel at next PCP visit   Medication Management: - Encouraged patient to obtain TID pill box - Placed referral for SDOH needs - transportation   Follow Up Plan: PCP 09/12/23, Pharmacist telephone 10/09/23   Arthea Larsson, PharmD PGY1 Pharmacy Resident

## 2023-09-11 ENCOUNTER — Ambulatory Visit: Payer: Self-pay

## 2023-09-11 ENCOUNTER — Telehealth: Payer: Self-pay | Admitting: *Deleted

## 2023-09-11 NOTE — Progress Notes (Signed)
 Complex Care Management Note  Care Guide Note 09/11/2023 Name: Evelyn Watts MRN: [id number] DOB: 22-Nov-1975  Evelyn Watts is a 48 y.o. year old female who sees Jerrlyn Morel, NP for primary care. I reached out to Caleb Castor by phone today to offer complex care management services.  Ms. Degan was given information about Complex Care Management services today including:   The Complex Care Management services include support from the care team which includes your Nurse Care Manager, Clinical Social Worker, or Pharmacist.  The Complex Care Management team is here to help remove barriers to the health concerns and goals most important to you. Complex Care Management services are voluntary, and the patient may decline or stop services at any time by request to their care team member.   Complex Care Management Consent Status: Patient agreed to services and verbal consent obtained.   Follow up plan:  Telephone appointment with complex care management team member scheduled for:  5/21 with BSW and 5/27 with RNCM   Encounter Outcome:  Patient Scheduled  Barnie Bora  [facility]  [facility] Guide  Direct Dial: [phone]  Fax [phone]

## 2023-09-12 ENCOUNTER — Ambulatory Visit (INDEPENDENT_AMBULATORY_CARE_PROVIDER_SITE_OTHER): Payer: Self-pay | Admitting: Nurse Practitioner

## 2023-09-12 ENCOUNTER — Encounter: Payer: Self-pay | Admitting: Nurse Practitioner

## 2023-09-12 VITALS — BP 132/82 | HR 103 | Temp 98.2°F | Wt 265.0 lb

## 2023-09-12 DIAGNOSIS — M25511 Pain in right shoulder: Secondary | ICD-10-CM | POA: Diagnosis not present

## 2023-09-12 DIAGNOSIS — I10 Essential (primary) hypertension: Secondary | ICD-10-CM | POA: Diagnosis not present

## 2023-09-12 DIAGNOSIS — Z794 Long term (current) use of insulin: Secondary | ICD-10-CM | POA: Diagnosis not present

## 2023-09-12 DIAGNOSIS — E785 Hyperlipidemia, unspecified: Secondary | ICD-10-CM | POA: Insufficient documentation

## 2023-09-12 DIAGNOSIS — E1165 Type 2 diabetes mellitus with hyperglycemia: Secondary | ICD-10-CM | POA: Diagnosis not present

## 2023-09-12 MED ORDER — CYCLOBENZAPRINE HCL 5 MG PO TABS: 5.0000 mg | ORAL_TABLET | Freq: Three times a day (TID) | ORAL | 1 refills | Status: DC | PRN

## 2023-09-12 MED ORDER — IBUPROFEN 800 MG PO TABS: 800.0000 mg | ORAL_TABLET | Freq: Three times a day (TID) | ORAL | 0 refills | Status: DC | PRN

## 2023-09-12 NOTE — Assessment & Plan Note (Signed)
 Lab Results  Component Value Date   CHOL 230 (H) 11/30/2022   HDL 41 11/30/2022   LDLCALC 110 (H) 11/30/2022   TRIG 456 (H) 11/30/2022   CHOLHDL 5.6 (H) 11/30/2022  On Crestor  20 mg daily Checking lipid panel

## 2023-09-12 NOTE — Assessment & Plan Note (Signed)
 Continue lisinopril  20 mg daily Follow-up with PCP in 4 weeks    09/12/2023    3:19 PM 06/08/2023    8:54 AM 06/08/2023    8:33 AM 03/20/2023   10:28 AM 03/12/2023   10:09 AM 03/08/2023    9:39 AM 03/02/2023   12:09 PM  BP/Weight  Systolic BP 132 135 151 162 138  139  Diastolic BP 82 91 89 87 77  83  Wt. (Lbs) 265  265.2 260  262.5   BMI 36.96 kg/m2  36.99 kg/m2 36.26 kg/m2  36.61 kg/m2

## 2023-09-12 NOTE — Assessment & Plan Note (Signed)
 take Tylenol  650 mg every 6 hours as needed and alternate with ibuprofen  800 mg 3 times daily as needed.  Take ibuprofen  with food Take Flexeril  at bedtime as needed only if drowsiness occurs Application of heat and ice encouraged - DG Shoulder Right - ibuprofen  (ADVIL ) 800 MG tablet; Take 1 tablet (800 mg total) by mouth every 8 (eight) hours as needed.  Dispense: 30 tablet; Refill: 0 - cyclobenzaprine  (FLEXERIL ) 5 MG tablet; Take 1 tablet (5 mg total) by mouth 3 (three) times daily as needed for muscle spasms.  Dispense: 30 tablet; Refill: 1

## 2023-09-12 NOTE — Progress Notes (Addendum)
 Acute Office Visit  Subjective:     Patient ID: Evelyn Watts, female    DOB: 06-10-1975, 48 y.o.   MRN: [id number]  Chief Complaint  Patient presents with   Arm Pain    For a few months      Arm Pain  Pertinent negatives include no chest pain or numbness.   Ms. Mara has a past medical history of Allergies, Asthma, Diabetes mellitus without complication (HCC), and Essential hypertension.  Patient presents with complaints of right shoulder pain  Right shoulder pain.  Patient complains of right shoulder pain radiating to the right elbow, has had this pain on and off but became worse in the past 2 months.  Her pain is currently rated 10/10.  Taking ibuprofen  600 mg as needed at home, has not taking ibuprofen  today.  She denies numbness, tingling, trauma    Review of Systems  Constitutional:  Negative for appetite change, chills, fatigue and fever.  HENT:  Negative for congestion, postnasal drip, rhinorrhea and sneezing.   Respiratory:  Negative for cough, shortness of breath and wheezing.   Cardiovascular:  Negative for chest pain, palpitations and leg swelling.  Gastrointestinal:  Negative for abdominal pain, constipation, nausea and vomiting.  Genitourinary:  Negative for difficulty urinating, dysuria, flank pain and frequency.  Musculoskeletal:  Positive for arthralgias. Negative for joint swelling and myalgias.  Skin:  Negative for color change, pallor, rash and wound.  Neurological:  Negative for dizziness, facial asymmetry, weakness, numbness and headaches.  Psychiatric/Behavioral:  Negative for behavioral problems, confusion, self-injury and suicidal ideas.         Objective:    BP 132/82   Pulse (!) 103   Temp 98.2 F (36.8 C)   Wt 265 lb (120.2 kg)   SpO2 100%   BMI 36.96 kg/m    Physical Exam Vitals and nursing note reviewed.  Constitutional:      General: She is not in acute distress.    Appearance: Normal appearance. She is obese. She is not  ill-appearing, toxic-appearing or diaphoretic.  HENT:     Mouth/Throat:     Mouth: Mucous membranes are moist.     Pharynx: Oropharynx is clear. No oropharyngeal exudate or posterior oropharyngeal erythema.  Eyes:     General: No scleral icterus.       Right eye: No discharge.        Left eye: No discharge.     Extraocular Movements: Extraocular movements intact.     Conjunctiva/sclera: Conjunctivae normal.  Cardiovascular:     Rate and Rhythm: Normal rate and regular rhythm.     Pulses: Normal pulses.     Heart sounds: Normal heart sounds. No murmur heard.    No friction rub. No gallop.  Pulmonary:     Effort: Pulmonary effort is normal. No respiratory distress.     Breath sounds: Normal breath sounds. No stridor. No wheezing, rhonchi or rales.  Chest:     Chest wall: No tenderness.  Abdominal:     General: There is no distension.     Palpations: Abdomen is soft.     Tenderness: There is no abdominal tenderness. There is no right CVA tenderness, left CVA tenderness or guarding.  Musculoskeletal:        General: Tenderness present. No swelling, deformity or signs of injury.     Right lower leg: No edema.     Left lower leg: No edema.     Comments: Limited range of motion of right shoulder.  Skin warm and dry no redness of swelling noted.  Has palpable radial pulse  Skin:    General: Skin is warm and dry.     Capillary Refill: Capillary refill takes less than 2 seconds.     Coloration: Skin is not jaundiced or pale.     Findings: No bruising, erythema or lesion.  Neurological:     Mental Status: She is alert and oriented to person, place, and time.     Motor: No weakness.     Coordination: Coordination normal.     Gait: Gait normal.  Psychiatric:        Mood and Affect: Mood normal.        Behavior: Behavior normal.        Thought Content: Thought content normal.        Judgment: Judgment normal.     No results found for any visits on 09/12/23.      Assessment &  Plan:   Problem List Items Addressed This Visit       Cardiovascular and Mediastinum   Essential hypertension   Continue lisinopril  20 mg daily Follow-up with PCP in 4 weeks    09/12/2023    3:19 PM 06/08/2023    8:54 AM 06/08/2023    8:33 AM 03/20/2023   10:28 AM 03/12/2023   10:09 AM 03/08/2023    9:39 AM 03/02/2023   12:09 PM  BP/Weight  Systolic BP 132 135 151 162 138  139  Diastolic BP 82 91 89 87 77  83  Wt. (Lbs) 265  265.2 260  262.5   BMI 36.96 kg/m2  36.99 kg/m2 36.26 kg/m2  36.61 kg/m2              Endocrine   Type 2 diabetes mellitus with hyperglycemia, with long-term current use of insulin  (HCC)   Lab Results  Component Value Date   HGBA1C 12.8 (A) 06/08/2023  Chronic uncontrolled condition Currently on metformin  1000 mg twice daily, Lantus  30 units twice daily, Victoza  1.8 mg daily, pharmacy plans on switching Victoza  to Ozempic  Ozempic  0.25 mg weekly ordered but the patient has not picked up the medication from the pharmacy, she was encouraged to go to the pharmacy and pick up the prescription, will need to stop taking Victoza  once she picks up prescription for Ozempic  CBG goals provided Patient counseled on low-carb diet Follow up with PCP in 4 weeks Checking A1c      Relevant Orders   CMP14+EGFR   Lipid panel   Direct LDL   Hemoglobin A1c     Other   Acute pain of right shoulder - Primary   take Tylenol  650 mg every 6 hours as needed and alternate with ibuprofen  800 mg 3 times daily as needed.  Take ibuprofen  with food Take Flexeril  at bedtime as needed only if drowsiness occurs Application of heat and ice encouraged - DG Shoulder Right - ibuprofen  (ADVIL ) 800 MG tablet; Take 1 tablet (800 mg total) by mouth every 8 (eight) hours as needed.  Dispense: 30 tablet; Refill: 0 - cyclobenzaprine  (FLEXERIL ) 5 MG tablet; Take 1 tablet (5 mg total) by mouth 3 (three) times daily as needed for muscle spasms.  Dispense: 30 tablet; Refill: 1       Relevant  Medications   ibuprofen  (ADVIL ) 800 MG tablet   cyclobenzaprine  (FLEXERIL ) 5 MG tablet   Other Relevant Orders   DG Shoulder Right   Dyslipidemia, goal LDL below 70   Lab Results  Component Value  Date   CHOL 230 (H) 11/30/2022   HDL 41 11/30/2022   LDLCALC 110 (H) 11/30/2022   TRIG 456 (H) 11/30/2022   CHOLHDL 5.6 (H) 11/30/2022  On Crestor  20 mg daily Checking lipid panel        Meds ordered this encounter  Medications   ibuprofen  (ADVIL ) 800 MG tablet    Sig: Take 1 tablet (800 mg total) by mouth every 8 (eight) hours as needed.    Dispense:  30 tablet    Refill:  0   cyclobenzaprine  (FLEXERIL ) 5 MG tablet    Sig: Take 1 tablet (5 mg total) by mouth 3 (three) times daily as needed for muscle spasms.    Dispense:  30 tablet    Refill:  1    Return in about 4 weeks (around 10/10/2023) for DM.  Yeilyn Gent R Mitra Duling, FNP

## 2023-09-12 NOTE — Patient Instructions (Addendum)
 Goal for fasting blood sugar ranges from 80 to 120 and 2 hours after any meal or at bedtime should be between 130 to 170.   Please alternate ibuprofen  with Tylenol  650 mg every 6 hours as needed.  Take ibuprofen  with food I also encourage the use of heating pad or ice Please get your x-ray done at [facility], to the radiology department Flexeril  can make you feel drowsy, if drowsiness PLEASE take it at bedtime as needed instead of 3 times daily as needed.  For muscle spasm You were given Toradol  60 mg injection in the office today    1. Type 2 diabetes mellitus with hyperglycemia, with long-term current use of insulin  (HCC) (Primary)  - CMP14+EGFR - Lipid panel - Direct LDL - Hemoglobin A1c  2. Acute pain of right shoulder  - DG Shoulder Right - ibuprofen  (ADVIL ) 800 MG tablet; Take 1 tablet (800 mg total) by mouth every 8 (eight) hours as needed.  Dispense: 30 tablet; Refill: Can make you feel drowsy if drowsiness or cause. - cyclobenzaprine  (FLEXERIL ) 5 MG tablet; Take 1 tablet (5 mg total) by mouth 3 (three) times daily as needed for muscle spasms.  Dispense: 30 tablet; Refill: 1    It is important that you exercise regularly at least 30 minutes 5 times a week as tolerated  Think about what you will eat, plan ahead. Choose " clean, green, fresh or frozen" over canned, processed or packaged foods which are more sugary, salty and fatty. 70 to 75% of food eaten should be vegetables and fruit. Three meals at set times with snacks allowed between meals, but they must be fruit or vegetables. Aim to eat over a 12 hour period , example 7 am to 7 pm, and STOP after  your last meal of the day. Drink water ,generally about 64 ounces per day, no other drink is as healthy. Fruit juice is best enjoyed in a healthy way, by EATING the fruit.  Thanks for choosing [facility] we consider it a privelige to serve you.

## 2023-09-12 NOTE — Assessment & Plan Note (Signed)
 Lab Results  Component Value Date   HGBA1C 12.8 (A) 06/08/2023  Chronic uncontrolled condition Currently on metformin  1000 mg twice daily, Lantus  30 units twice daily, Victoza  1.8 mg daily, pharmacy plans on switching Victoza  to Ozempic  Ozempic  0.25 mg weekly ordered but the patient has not picked up the medication from the pharmacy, she was encouraged to go to the pharmacy and pick up the prescription, will need to stop taking Victoza  once she picks up prescription for Ozempic  CBG goals provided Patient counseled on low-carb diet Follow up with PCP in 4 weeks Checking A1c

## 2023-09-13 ENCOUNTER — Other Ambulatory Visit: Payer: Self-pay

## 2023-09-13 ENCOUNTER — Other Ambulatory Visit: Payer: Self-pay | Admitting: Nurse Practitioner

## 2023-09-13 ENCOUNTER — Ambulatory Visit: Payer: Self-pay | Admitting: Nurse Practitioner

## 2023-09-13 DIAGNOSIS — E1165 Type 2 diabetes mellitus with hyperglycemia: Secondary | ICD-10-CM

## 2023-09-13 DIAGNOSIS — E1169 Type 2 diabetes mellitus with other specified complication: Secondary | ICD-10-CM

## 2023-09-13 LAB — LIPID PANEL
Chol/HDL Ratio: 5.4 ratio — ABNORMAL HIGH (ref 0.0–4.4)
Cholesterol, Total: 163 mg/dL (ref 100–199)
HDL: 30 mg/dL — ABNORMAL LOW (ref >39)
LDL Chol Calc (NIH): 73 mg/dL (ref 0–99)
Triglycerides: 380 mg/dL — ABNORMAL HIGH (ref 0–149)
VLDL Cholesterol Cal: 60 mg/dL — ABNORMAL HIGH (ref 5–40)

## 2023-09-13 LAB — CMP14+EGFR
ALT: 14 IU/L (ref 0–32)
AST: 14 IU/L (ref 0–40)
Albumin: 3.8 g/dL — ABNORMAL LOW (ref 3.9–4.9)
Alkaline Phosphatase: 104 IU/L (ref 44–121)
BUN/Creatinine Ratio: 24 — ABNORMAL HIGH (ref 9–23)
BUN: 14 mg/dL (ref 6–24)
Bilirubin Total: <0.2 mg/dL (ref 0.0–1.2)
CO2: 22 mmol/L (ref 20–29)
Calcium: 9.7 mg/dL (ref 8.7–10.2)
Chloride: 97 mmol/L (ref 96–106)
Creatinine, Ser: 0.59 mg/dL (ref 0.57–1.00)
Globulin, Total: 3.1 g/dL (ref 1.5–4.5)
Glucose: 316 mg/dL — ABNORMAL HIGH (ref 70–99)
Potassium: 4.7 mmol/L (ref 3.5–5.2)
Sodium: 133 mmol/L — ABNORMAL LOW (ref 134–144)
Total Protein: 6.9 g/dL (ref 6.0–8.5)
eGFR: 113 mL/min/{1.73_m2} (ref >59)

## 2023-09-13 LAB — LDL CHOLESTEROL, DIRECT: LDL Direct: 79 mg/dL (ref 0–99)

## 2023-09-13 MED ORDER — KETOROLAC TROMETHAMINE 60 MG/2ML IM SOLN
60.0000 mg | Freq: Once | INTRAMUSCULAR | Status: AC
  Administered 2023-09-12: 60 mg via INTRAMUSCULAR

## 2023-09-13 MED ORDER — ROSUVASTATIN CALCIUM 40 MG PO TABS: 40.0000 mg | ORAL_TABLET | Freq: Every day | ORAL | 1 refills | Status: DC

## 2023-09-13 NOTE — Patient Outreach (Signed)
 Complex Care Management   Visit Note  09/13/2023  Name:  Evelyn Watts MRN: [id number] DOB: 11/27/1975  Situation: Referral received for Complex Care Management related to SDOH Barriers:  Transportation I obtained verbal consent from Patient.  Visit completed with patient  on the phone  Background:   Past Medical History:  Diagnosis Date   Allergies    Asthma    Diabetes mellitus without complication (HCC)    Essential hypertension     Assessment: BSW outreached patient to assist with transportation needs. Patient was provided with transportation and food pantry resources.   SDOH Interventions Today    Flowsheet Row Most Recent Value  SDOH Interventions   Food Insecurity Interventions Community Resources Provided  Transportation Interventions Community Resources Provided        Recommendation:   No recommendations at this time.  Follow Up Plan:   Patient has met all care management goals. Care Management case will be closed. Patient has been provided contact information should new needs arise.   Haven Lion, BSW [place]  [facility] Social Worker, [employer] [facility] [phone]

## 2023-09-13 NOTE — Patient Instructions (Signed)
 Visit Information  Thank you for taking time to visit with me today. Please don't hesitate to contact me if I can be of assistance to you before our next scheduled appointment.   Please call the Suicide and Crisis Lifeline: 988 call the [place]  National Suicide Prevention Lifeline: 757-177-7156 or TTY: 313 252 4118 TTY 640-399-1535) to talk to a trained counselor call 1-800-273-TALK (toll free, 24 hour hotline) go to [facility] [address], [place] [phone]) call 911 if you are experiencing a Mental Health or Behavioral Health Crisis or need someone to talk to.  Patient verbalizes understanding of instructions and care plan provided today and agrees to view in MyChart. Active MyChart status and patient understanding of how to access instructions and care plan via MyChart confirmed with patient.     Haven Lion, BSW [place]  [facility] Social Worker, [employer] [facility] [phone]

## 2023-09-13 NOTE — Addendum Note (Signed)
 Addended by: Julian Obey on: 09/13/2023 11:51 AM   Modules accepted: Orders

## 2023-09-19 ENCOUNTER — Telehealth: Payer: Self-pay | Admitting: *Deleted

## 2023-09-19 ENCOUNTER — Other Ambulatory Visit: Payer: Self-pay

## 2023-09-19 NOTE — Progress Notes (Signed)
 Complex Care Management Care Guide Note  09/19/2023 Name: Evelyn Watts MRN: [id number] DOB: [birth year]  Evelyn Watts is a 48 y.o. year old female who is a primary care patient of Jerrlyn Morel, NP and is actively engaged with the care management team. I reached out to Caleb Castor by phone today to assist with re-scheduling  with the BSW.  Follow up plan: Unsuccessful telephone outreach attempt made. A HIPAA compliant phone message was left for the patient providing contact information and requesting a return call.  Barnie Bora  [facility]  Value-Based Care Institute, [facility] Guide  Direct Dial: [phone]  Fax [phone]

## 2023-09-19 NOTE — Progress Notes (Signed)
 Complex Care Management Care Guide Note  09/19/2023 Name: Krystol Rocco MRN: [id number] DOB: 1975/06/12  Amelianna Meller is a 48 y.o. year old female who is a primary care patient of Jerrlyn Morel, NP and is actively engaged with the care management team. I reached out to Caleb Castor by phone today to assist with re-scheduling  with the BSW.  Follow up plan: Telephone appointment with complex care management team member scheduled for:  10/22/23  Barnie Bora  [facility]  Value-Based Care Institute, [facility] Guide  Direct Dial: [phone]  Fax [phone]

## 2023-09-19 NOTE — Patient Outreach (Addendum)
 Complex Care Management   Visit Note  09/19/2023  Name:  Evelyn Watts MRN: [id number] DOB: 14-Feb-1976  Situation: Referral received for Complex Care Management related to Diabetes with Complications and HTN I obtained verbal consent from Patient.  Visit completed with Caleb Castor  on the phone  Background:   Past Medical History:  Diagnosis Date   Allergies    Asthma    Diabetes mellitus without complication (HCC)    Essential hypertension     Assessment: Patient Reported Symptoms:  Cognitive Cognitive Status: Able to follow simple commands, Alert and oriented to person, place, and time, Normal speech and language skills Cognitive/Intellectual Conditions Management [RPT]: None reported or documented in medical history or problem list      Neurological Neurological Review of Symptoms: No symptoms reported    HEENT HEENT Symptoms Reported: No symptoms reported HEENT Conditions: Vision problem(s), Tooth problem(s) Tooth Problems: loose Vision Problems: cataract(s) (L eye per patient) HEENT Comment: Patient notes she is supposed to wear glasses but does not have them. Tried to go to [facility] for eye exam but they do not accept her insurance. Patient will call her insurance to ask about an eye doctor and a dentist that is within network. Vision problem(s), Tooth problem(s)  Cardiovascular Cardiovascular Symptoms Reported: No symptoms reported Does patient have uncontrolled Hypertension?: Yes Is patient checking Blood Pressure at home?: No Cardiovascular Conditions: Hypertension, High blood cholesterol Cardiovascular Management Strategies: Medication therapy Cardiovascular Comment: Note that patient was ordered a BP cuff but has not recieved. Call placed to [facility] where order was sent. Unable to provide through her insurance since she also has a Medicare plan. Advised patient to ask [facility] about OTC benefits for BP monitor.  Respiratory Respiratory Symptoms Reported:  No symptoms reported Respiratory Conditions: Seasonal allergies Respiratory Comment: Patient reports she mostly uses PRN Albuterol  during high-allergy seasons. Last use about a month ago. She reports inhaler works well when she does use it.  Endocrine Patient reports the following symptoms related to hypoglycemia or hyperglycemia : No symptoms reported Is patient diabetic?: Yes Is patient checking blood sugars at home?: Yes Endocrine Conditions: Diabetes Endocrine Management Strategies: Medication therapy Endocrine Comment: Note that patient was prescribed FreeStyle Libre but has not recieved. She states she is expecting a shipment from mail order pharmacy soon and is hoping monitor will be with that shipment.  Gastrointestinal Gastrointestinal Symptoms Reported: No symptoms reported (Last BM 2 days ago. Normal per patient) Gastrointestinal Management Strategies: Diet modification Gastrointestinal Comment: Patient reports her appetite is "alright." She is eating 3 meals a day. 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 Falls in the past year?: No Number of falls in past year: 1 or less Was there an injury with Fall?: No Fall Risk Category Calculator: 0 Patient Fall Risk Level: Low Fall Risk Patient at Risk for Falls Due to: No Fall Risks Fall risk Follow up: Falls evaluation completed, Education provided  Psychosocial Psychosocial Symptoms Reported: Anxiety - if selected complete GAD, Depression - if  selected complete PHQ 2-9 Behavioral Health Conditions: Depression Behavioral Management Strategies: Coping strategies Major Change/Loss/Stressor/Fears (CP): Traumatic  event Techniques to Cope with Loss/Stress/Change: Diversional activities Quality of Family Relationships: involved, supportive (Patient does speak with her family, but they do not live locally so they are not involved in her care) Do you feel physically threatened by others?: No      09/19/2023    4:12 PM  Depression screen PHQ 2/9  Decreased Interest 0  Down, Depressed, Hopeless 1  PHQ - 2 Score 1    There were no vitals filed for this visit.  Medications Reviewed Today     Reviewed by Valaria Garland, RN (Registered Nurse) on 09/19/23 at 1556  Med List Status: <None>   Medication Order Taking? Sig Documenting Provider Last Dose Status Informant  Accu-Chek Softclix Lancets lancets [id number]  Use as instructed to monitor blood sugar. Dx: E11.65. Jerrlyn Morel, NP  Active   albuterol  (VENTOLIN  HFA) 108 (90 Base) MCG/ACT inhaler [id number]  Inhale 2 puffs into the lungs every 4 (four) hours as needed for wheezing. Jerrlyn Morel, NP  Active   Blood Pressure Monitoring (BLOOD PRESSURE CUFF) MISC [id number]  Use as directed to monitor blood pressure  Patient not taking: Reported on 09/12/2023   Jerrlyn Morel, NP  Active            Med Note Francetta Innocent, LAYNA P   Tue Aug 15, 2023 11:41 AM) Has not recieved  cetirizine  (ZYRTEC ) 10 MG tablet [id number]  Take 1 tablet (10 mg total) by mouth daily. Buena Carmine, NP  Active   Continuous Glucose Sensor (FREESTYLE LIBRE 3 PLUS SENSOR) [place] [id number]  Change sensor every 15 days. Use to check glucose continuously using the [employer] 3 app.  Patient not taking: Reported on 09/12/2023   Jerrlyn Morel, NP  Active            Med Note Petrina Bras Sep 10, 2023 10:30 AM) Has not recieved  cyclobenzaprine  (FLEXERIL ) 5 MG tablet [id number]  Take 1 tablet (5 mg  total) by mouth 3 (three) times daily as needed for muscle spasms. Paseda, Folashade R, FNP  Active   Ferrous Sulfate  90 (18 Fe) MG TABS [id number]  Take 1 tablet by mouth daily.  Patient not taking: Reported on 09/12/2023   Jerrlyn Morel, NP  Active            Med Note Petrina Bras Apr 09, 2023  1:03 PM) Ran out  fluconazole  (DIFLUCAN ) 150 MG tablet [id number]  Take 1 tablet (150 mg total) by mouth daily.  Patient not taking: Reported on 09/12/2023   Jerrlyn Morel, NP  Active   folic acid  (FOLVITE ) 800 MCG tablet [id number]  Take 0.5 tablets (400 mcg total) by mouth daily. Jerrlyn Morel, NP  Active   gabapentin  (NEURONTIN ) 300 MG capsule [id number]  TAKE 1 CAPSULE THREE TIMES DAILY Nichols, Tonya S, NP  Active   glucose blood (ACCU-CHEK GUIDE TEST) test strip [id number]  Use as instructed to monitor blood sugar. Dx: E11.65. Jerrlyn Morel, NP  Active   ibuprofen  (ADVIL ) 800 MG tablet [id number]  Take 1 tablet (800 mg total) by mouth every 8 (eight) hours as needed. Paseda, Folashade R, FNP  Active   insulin  glargine (LANTUS ) 100 UNIT/ML Solostar Pen [id number]  Inject 30 Units into the skin 2 (two) times daily. Jerrlyn Morel, NP  Active   Insulin  Pen Needle (PEN NEEDLES 3/16") 31G X 5 MM MISC [id number]  Use to inject insulin  twice  daily and liraglutide  (Victoza ) once daily. E11.65. Jerrlyn Morel, NP  Active   liraglutide  (VICTOZA ) 18 MG/3ML SOPN [id number]  Inject 1.8 mg into the skin daily.  Patient not taking: Reported on 09/12/2023   Jerrlyn Morel, NP  Active   lisinopril  (ZESTRIL ) 20 MG tablet [id number]  Take 1 tablet (20 mg total) by mouth daily. Jerrlyn Morel, NP  Active   metFORMIN  (GLUCOPHAGE -XR) 500 MG 24 hr tablet [id number]  Take 2 tablets (1,000 mg total) by mouth 2 (two) times daily with a meal. Jerrlyn Morel, NP  Active   rosuvastatin  (CRESTOR ) 40 MG tablet [id number]  Take 1 tablet (40 mg total) by mouth daily. Paseda, Folashade R, FNP  Active    Semaglutide ,0.25 or 0.5MG /DOS, 2 MG/3ML SOPN [id number]  Inject 0.25 mg into the skin once a week.  Patient not taking: Reported on 09/12/2023   Jerrlyn Morel, NP  Active            Med Note Petrina Bras Sep 10, 2023 10:30 AM) Patient was unaware this was prescribed            Recommendation:   Continue Current Plan of Care Patient will discuss with PCP at next visit: need for rollator, podiatry referral Patient will contact [employer] to ask about: OTC benefit for BP monitor, assistance locating eye doctor and dentist within network Patient will print and complete [employer] application  Follow Up Plan:   Telephone follow up appointment date/time:  10/03/23 at 1 PM  Theodora Fish, RN MSN [place]  VBCI Population Health RN Care Manager Direct Dial: [phone]  Fax: [phone]

## 2023-09-19 NOTE — Patient Instructions (Signed)
 Visit Information  Thank you for taking time to visit with me today. Please don't hesitate to contact me if I can be of assistance to you before our next scheduled appointment.  Our next appointment is by telephone on 10/03/23 at 1 PM Please call the care guide team at [phone] if you need to cancel or reschedule your appointment.   Following is a copy of your care plan:   Goals Addressed             This Visit's Progress    VBCI RN Care Plan related to DM   On track    Problems:  Chronic Disease Management support and education needs related to DMII  Goal: Over the next 30 days the Patient will attend all scheduled medical appointments: PCP as evidenced by completed visit notes uploaded to EMR        demonstrate Improved adherence to prescribed treatment plan for DMII as evidenced by patient report of fasting glucose 80-120 and after meals 130-170 as set by provider take all medications exactly as prescribed and will call provider for medication related questions as evidenced by patient report of medication compliance    work with Child psychotherapist to address Transportation related to the management of health conditions as evidenced by review of electronic medical record and patient or social worker report      Interventions:   Diabetes Interventions: Assessed patient's understanding of A1c goal: <7% Reviewed medications with patient and discussed importance of medication adherence Discussed plans with patient for ongoing care management follow up and provided patient with direct contact information for care management team Provided patient with written educational materials related to hypo and hyperglycemia and importance of correct treatment Reviewed scheduled/upcoming provider appointments including: PCP 10/11/23 Advised patient, providing education and rationale, to check cbg before meals and at bedtime and record, calling primary provider for findings outside established  parameters Lab Results  Component Value Date   HGBA1C 12.8 (A) 06/08/2023    Evaluation of current treatment plan related to DMII,  self-management and patient's adherence to plan as established by provider. Discussed plans with patient for ongoing care management follow up and provided patient with direct contact information for care management team Reviewed medications with patient and discussed switching from Victoza  to Ozempic  once new medication shipment has arrived Advised patient that FreeStyle Libre order was sent to mail order pharmacy and to look for it with her next shipment  Patient Self-Care Activities:  Attend all scheduled provider appointments Call provider office for new concerns or questions  Take medications as prescribed   Work with the social worker to address care coordination needs and will continue to work with the clinical team to address health care and disease management related needs schedule appointment with eye doctor check blood sugar at prescribed times: before meals and at bedtime check feet daily for cuts, sores or redness take the blood sugar log to all doctor visits eat fish at least once per week Call [facility] to assist in locating an eye doctor and dentist that accepts her insurance  Plan:  Telephone follow up appointment with care management team member scheduled for:  10/03/23 at 1 PM The patient will call [employer] as advised to ask about eye doctor and dentist within network, ask about OTC benefits for BP cuff.           VBCI RN Care Plan related to HTN   On track    Problems:  Chronic Disease Management support and  education needs related to HTN  Goal: Over the next 30 days the Patient will attend all scheduled medical appointments: PCP as evidenced by completed visit notes uploaded to EMR        take all medications exactly as prescribed and will call provider for medication related questions as evidenced by patient report of medication  adherence    work with Child psychotherapist to address Transportation related to the management of health conditions as evidenced by review of electronic medical record and patient or social worker report     Obtain BP cuff through insurance  Interventions:   Evaluation of current treatment plan related to HTN,  self-management and patient's adherence to plan as established by provider. Discussed plans with patient for ongoing care management follow up and provided patient with direct contact information for care management team Evaluation of current treatment plan related to HTN and patient's adherence to plan as established by provider Call placed to [facility] regarding order for BP cuff. Unable to fill under patient's Medicaid. Advised patient to call [employer] to ask about OTC benefits for BP monitor.  Hypertension Interventions: Last practice recorded BP readings:  BP Readings from Last 3 Encounters:  09/12/23 132/82  06/08/23 (!) 135/91  03/20/23 (!) 162/87   Most recent eGFR/CrCl:  Lab Results  Component Value Date   EGFR 113 09/12/2023    No components found for: "CRCL"  Evaluation of current treatment plan related to hypertension self management and patient's adherence to plan as established by provider Reviewed medications with patient and discussed importance of compliance Provided assistance with obtaining home blood pressure monitor via [employer];  Patient Self-Care Activities:  Attend all scheduled provider appointments Call provider office for new concerns or questions  Take medications as prescribed   Work with the social worker to address care coordination needs and will continue to work with the clinical team to address health care and disease management related needs Work with insurance to obtain BP cuff  Plan:  Telephone follow up appointment with care management team member scheduled for:  10/03/23 at 1 PM The patient will call [employer] as advised to ask about eye  doctor and dentist within network, OTC benefits for BP monitor.              Please call the Suicide and Crisis Lifeline: 988 call 1-800-273-TALK (toll free, 24 hour hotline) if you are experiencing a Mental Health or Behavioral Health Crisis or need someone to talk to.  Patient verbalizes understanding of instructions and care plan provided today and agrees to view in MyChart. Active MyChart status and patient understanding of how to access instructions and care plan via MyChart confirmed with patient.     Theodora Fish, RN MSN [place]  VBCI Population Health RN Care Manager Direct Dial: [phone]  Fax: [phone]

## 2023-09-21 ENCOUNTER — Other Ambulatory Visit: Payer: Self-pay

## 2023-09-21 NOTE — Patient Instructions (Signed)
 Visit Information  Thank you for taking time to visit with me today. Please don't hesitate to contact me if I can be of assistance to you before our next scheduled appointment.  Your next care management appointment is by telephone on 10/11/2023 at 11am    Please call the care guide team at [phone] if you need to cancel, schedule, or reschedule an appointment.   Please call the Suicide and Crisis Lifeline: 988 call the [place]  National Suicide Prevention Lifeline: 438 265 9611 or TTY: 854-120-2902 TTY 401 468 7909) to talk to a trained counselor call 1-800-273-TALK (toll free, 24 hour hotline) go to [facility] [address] [place] [phone]) call 911 if you are experiencing a Mental Health or Behavioral Health Crisis or need someone to talk to.  Haven Lion, BSW [place]  [facility] Institute Social Worker, [employer] [facility] [phone]

## 2023-09-21 NOTE — Patient Outreach (Signed)
 Complex Care Management   Visit Note  09/21/2023  Name:  Evelyn Watts MRN: [id number] DOB: 03-02-1976  Situation: Referral received for Complex Care Management related to SDOH Barriers:  Transportation I obtained verbal consent from Patient.  Visit completed with patient  on the phone  Background:   Past Medical History:  Diagnosis Date   Allergies    Asthma    Diabetes mellitus without complication (HCC)    Essential hypertension     Assessment: BSW followed up with patient to see if they had received the transportation resources.  Patient did receive resources.     Recommendation:   No recommendations at this time.   Follow Up Plan:   Telephone follow-up on 10/11/2023  Haven Lion, BSW [place]  [facility] Institute Social Worker, [employer] [facility] [phone]

## 2023-09-28 ENCOUNTER — Other Ambulatory Visit: Payer: Self-pay | Admitting: Nurse Practitioner

## 2023-09-28 DIAGNOSIS — B379 Candidiasis, unspecified: Secondary | ICD-10-CM

## 2023-10-03 ENCOUNTER — Other Ambulatory Visit (HOSPITAL_COMMUNITY): Payer: Self-pay

## 2023-10-03 ENCOUNTER — Telehealth: Payer: Self-pay

## 2023-10-03 ENCOUNTER — Other Ambulatory Visit: Payer: Self-pay

## 2023-10-03 DIAGNOSIS — Z794 Long term (current) use of insulin: Secondary | ICD-10-CM

## 2023-10-03 MED ORDER — FREESTYLE LIBRE 3 PLUS SENSOR MISC
3 refills | Status: DC
  Filled 2023-10-03: qty 6, 90d supply, fill #0
  Filled 2023-10-09: qty 6, 84d supply, fill #0
  Filled 2024-01-01: qty 6, 84d supply, fill #1

## 2023-10-03 NOTE — Patient Instructions (Signed)
 Visit Information  Thank you for taking time to visit with me today. Please don't hesitate to contact me if I can be of assistance to you before our next scheduled appointment.  Your next care management appointment is by telephone on 10/16/23 at 1 PM  I have included information regarding a diabetic diet. Please visit the American Diabetes Association and/or the Centers for Disease Control to learn more and to find helpful recipes.  Please call the care guide team at [phone] if you need to cancel, schedule, or reschedule an appointment.   Please call the Suicide and Crisis Lifeline: 988 call 1-800-273-TALK (toll free, 24 hour hotline) if you are experiencing a Mental Health or Behavioral Health Crisis or need someone to talk to.  Theodora Fish, RN MSN [place]  [facility] Population Health RN Care Manager Direct Dial: [phone]  Fax: [phone]  Diabetes Mellitus and Nutrition, Adult When you have diabetes, or diabetes mellitus, it is very important to have healthy eating habits because your blood sugar (glucose) levels are greatly affected by what you eat and drink. Eating healthy foods in the right amounts, at about the same times every day, can help you: Manage your blood glucose. Lower your risk of heart disease. Improve your blood pressure. Reach or maintain a healthy weight. What can affect my meal plan? Every person with diabetes is different, and each person has different needs for a meal plan. Your health care provider may recommend that you work with a dietitian to make a meal plan that is best for you. Your meal plan may vary depending on factors such as: The calories you need. The medicines you take. Your weight. Your blood glucose, blood pressure, and cholesterol levels. Your activity level. Other health conditions you have, such as heart or kidney disease. How do carbohydrates affect me? Carbohydrates, also called carbs, affect your blood glucose level more  than any other type of food. Eating carbs raises the amount of glucose in your blood. It is important to know how many carbs you can safely have in each meal. This is different for every person. Your dietitian can help you calculate how many carbs you should have at each meal and for each snack. How does alcohol affect me? Alcohol can cause a decrease in blood glucose (hypoglycemia), especially if you use insulin  or take certain diabetes medicines by mouth. Hypoglycemia can be a life-threatening condition. Symptoms of hypoglycemia, such as sleepiness, dizziness, and confusion, are similar to symptoms of having too much alcohol. Do not drink alcohol if: Your health care provider tells you not to drink. You are pregnant, may be pregnant, or are planning to become pregnant. If you drink alcohol: Limit how much you have to: 0-1 drink a day for women. 0-2 drinks a day for men. Know how much alcohol is in your drink. In the [place] one drink equals one 12 oz bottle of beer (355 mL), one 5 oz glass of wine (148 mL), or one 1 oz glass of hard liquor (44 mL). Keep yourself hydrated with water , diet soda, or unsweetened iced tea. Keep in mind that regular soda, juice, and other mixers may contain a lot of sugar and must be counted as carbs. What are tips for following this plan?  Reading food labels Start by checking the serving size on the Nutrition Facts label of packaged foods and drinks. The number of calories and the amount of carbs, fats, and other nutrients listed on the label are based on one serving  of the item. Many items contain more than one serving per package. Check the total grams (g) of carbs in one serving. Check the number of grams of saturated fats and trans fats in one serving. Choose foods that have a low amount or none of these fats. Check the number of milligrams (mg) of salt (sodium) in one serving. Most people should limit total sodium intake to less than 2,300 mg per day. Always  check the nutrition information of foods labeled as "low-fat" or "nonfat." These foods may be higher in added sugar or refined carbs and should be avoided. Talk to your dietitian to identify your daily goals for nutrients listed on the label. Shopping Avoid buying canned, pre-made, or processed foods. These foods tend to be high in fat, sodium, and added sugar. Shop around the outside edge of the grocery store. This is where you will most often find fresh fruits and vegetables, bulk grains, fresh meats, and fresh dairy products. Cooking Use low-heat cooking methods, such as baking, instead of high-heat cooking methods, such as deep frying. Cook using healthy oils, such as olive, canola, or sunflower oil. Avoid cooking with butter, cream, or high-fat meats. Meal planning Eat meals and snacks regularly, preferably at the same times every day. Avoid going long periods of time without eating. Eat foods that are high in fiber, such as fresh fruits, vegetables, beans, and whole grains. Eat 4-6 oz (112-168 g) of lean protein each day, such as lean meat, chicken, fish, eggs, or tofu. One ounce (oz) (28 g) of lean protein is equal to: 1 oz (28 g) of meat, chicken, or fish. 1 egg.  cup (62 g) of tofu. Eat some foods each day that contain healthy fats, such as avocado, nuts, seeds, and fish. What foods should I eat? Fruits Berries. Apples. Oranges. Peaches. Apricots. Plums. Grapes. Mangoes. Papayas. Pomegranates. Kiwi. Cherries. Vegetables Leafy greens, including lettuce, spinach, kale, chard, collard greens, mustard greens, and cabbage. Beets. Cauliflower. Broccoli. Carrots. Green beans. Tomatoes. Peppers. Onions. Cucumbers. Brussels sprouts. Grains Whole grains, such as whole-wheat or whole-grain bread, crackers, tortillas, cereal, and pasta. Unsweetened oatmeal. Quinoa. Brown or wild rice. Meats and other proteins Seafood. Poultry without skin. Lean cuts of poultry and beef. Tofu. Nuts.  Seeds. Dairy Low-fat or fat-free dairy products such as milk, yogurt, and cheese. The items listed above may not be a complete list of foods and beverages you can eat and drink. Contact a dietitian for more information. What foods should I avoid? Fruits Fruits canned with syrup. Vegetables Canned vegetables. Frozen vegetables with butter or cream sauce. Grains Refined white flour and flour products such as bread, pasta, snack foods, and cereals. Avoid all processed foods. Meats and other proteins Fatty cuts of meat. Poultry with skin. Breaded or fried meats. Processed meat. Avoid saturated fats. Dairy Full-fat yogurt, cheese, or milk. Beverages Sweetened drinks, such as soda or iced tea. The items listed above may not be a complete list of foods and beverages you should avoid. Contact a dietitian for more information. Questions to ask a health care provider Do I need to meet with a certified diabetes care and education specialist? Do I need to meet with a dietitian? What number can I call if I have questions? When are the best times to check my blood glucose? Where to find more information: American Diabetes Association: diabetes.org Academy of Nutrition and Dietetics: eatright.[employer] of Diabetes and Digestive and Kidney Diseases: StageSync.si Association of Diabetes Care & Education Specialists: diabeteseducator.org  Summary It is important to have healthy eating habits because your blood sugar (glucose) levels are greatly affected by what you eat and drink. It is important to use alcohol carefully. A healthy meal plan will help you manage your blood glucose and lower your risk of heart disease. Your health care provider may recommend that you work with a dietitian to make a meal plan that is best for you. This information is not intended to replace advice given to you by your health care provider. Make sure you discuss any questions you have with your health care  provider. Document Revised: 11/12/2019 Document Reviewed: 11/13/2019 Elsevier Patient Education  2024 Elsevier Inc.   Carbohydrate Counting for Diabetes Mellitus, Adult Carbohydrate counting is a method of keeping track of how many carbohydrates you eat. Eating carbohydrates increases the amount of sugar (glucose) in the blood. Counting how many carbohydrates you eat improves how well you manage your blood glucose. This, in turn, helps you manage your diabetes. Carbohydrates are measured in grams (g) per serving. It is important to know how many carbohydrates (in grams or by serving size) you can have in each meal. This is different for every person. A dietitian can help you make a meal plan and calculate how many carbohydrates you should have at each meal and snack. What foods contain carbohydrates? Carbohydrates are found in the following foods: Grains, such as breads and cereals. Dried beans and soy products. Starchy vegetables, such as potatoes, peas, and corn. Fruit and fruit juices. Milk and yogurt. Sweets and snack foods, such as cake, cookies, candy, chips, and soft drinks. How do I count carbohydrates in foods? There are two ways to count carbohydrates in food. You can read food labels or learn standard serving sizes of foods. You can use either of these methods or a combination of both. Using the Nutrition Facts label The Nutrition Facts list is included on the labels of almost all packaged foods and beverages in the [place] . It includes: The serving size. Information about nutrients in each serving, including the grams of carbohydrate per serving. To use the Nutrition Facts, decide how many servings you will have. Then, multiply the number of servings by the number of carbohydrates per serving. The resulting number is the total grams of carbohydrates that you will be having. Learning the standard serving sizes of foods When you eat carbohydrate foods that are not packaged or  do not include Nutrition Facts on the label, you need to measure the servings in order to count the grams of carbohydrates. Measure the foods that you will eat with a food scale or measuring cup, if needed. Decide how many standard-size servings you will eat. Multiply the number of servings by 15. For foods that contain carbohydrates, one serving equals 15 g of carbohydrates. For example, if you eat 2 cups or 10 oz (300 g) of strawberries, you will have eaten 2 servings and 30 g of carbohydrates (2 servings x 15 g = 30 g). For foods that have more than one food mixed, such as soups and casseroles, you must count the carbohydrates in each food that is included. The following list contains standard serving sizes of common carbohydrate-rich foods. Each of these servings has about 15 g of carbohydrates: 1 slice of bread. 1 six-inch (15 cm) tortilla. ? cup or 2 oz (53 g) cooked rice or pasta.  cup or 3 oz (85 g) cooked or canned, drained and rinsed beans or lentils.  cup or 3 oz (  85 g) starchy vegetable, such as peas, corn, or squash.  cup or 4 oz (120 g) hot cereal.  cup or 3 oz (85 g) boiled or mashed potatoes, or  or 3 oz (85 g) of a large baked potato.  cup or 4 fl oz (118 mL) fruit juice. 1 cup or 8 fl oz (237 mL) milk. 1 small or 4 oz (106 g) apple.  or 2 oz (63 g) of a medium banana. 1 cup or 5 oz (150 g) strawberries. 3 cups or 1 oz (28.3 g) popped popcorn. What is an example of carbohydrate counting? To calculate the grams of carbohydrates in this sample meal, follow the steps shown below. Sample meal 3 oz (85 g) chicken breast. ? cup or 4 oz (106 g) brown rice.  cup or 3 oz (85 g) corn. 1 cup or 8 fl oz (237 mL) milk. 1 cup or 5 oz (150 g) strawberries with sugar-free whipped topping. Carbohydrate calculation Identify the foods that contain carbohydrates: Rice. Corn. Milk. Strawberries. Calculate how many servings you have of each food: 2 servings rice. 1 serving  corn. 1 serving milk. 1 serving strawberries. Multiply each number of servings by 15 g: 2 servings rice x 15 g = 30 g. 1 serving corn x 15 g = 15 g. 1 serving milk x 15 g = 15 g. 1 serving strawberries x 15 g = 15 g. Add together all of the amounts to find the total grams of carbohydrates eaten: 30 g + 15 g + 15 g + 15 g = 75 g of carbohydrates total. What are tips for following this plan? Shopping Develop a meal plan and then make a shopping list. Buy fresh and frozen vegetables, fresh and frozen fruit, dairy, eggs, beans, lentils, and whole grains. Look at food labels. Choose foods that have more fiber and less sugar. Avoid processed foods and foods with added sugars. Meal planning Aim to have the same number of grams of carbohydrates at each meal and for each snack time. Plan to have regular, balanced meals and snacks. Where to find more information American Diabetes Association: diabetes.org Centers for Disease Control and Prevention: TonerPromos.no Academy of Nutrition and Dietetics: eatright.org Association of Diabetes Care & Education Specialists: diabeteseducator.org Summary Carbohydrate counting is a method of keeping track of how many carbohydrates you eat. Eating carbohydrates increases the amount of sugar (glucose) in your blood. Counting how many carbohydrates you eat improves how well you manage your blood glucose. This helps you manage your diabetes. A dietitian can help you make a meal plan and calculate how many carbohydrates you should have at each meal and snack. This information is not intended to replace advice given to you by your health care provider. Make sure you discuss any questions you have with your health care provider. Document Revised: 11/12/2019 Document Reviewed: 11/13/2019 Elsevier Patient Education  2024 [employer].

## 2023-10-03 NOTE — Patient Outreach (Signed)
 Complex Care Management   Visit Note  10/03/2023  Name:  Evelyn Watts MRN: [id number] DOB: 06/15/1975  Situation: Referral received for Complex Care Management related to Diabetes with Complications and HTN I obtained verbal consent from Patient.  Visit completed with Caleb Castor  on the phone  Background:   Past Medical History:  Diagnosis Date   Allergies    Asthma    Diabetes mellitus without complication (HCC)    Essential hypertension     Assessment: Patient Reported Symptoms:  Cognitive Cognitive Status: Able to follow simple commands, Alert and oriented to person, place, and time, Normal speech and language skills Cognitive/Intellectual Conditions Management [RPT]: None reported or documented in medical history or problem list      Neurological Neurological Review of Symptoms: Not assessed    HEENT HEENT Symptoms Reported: No symptoms reported HEENT Conditions: Vision problem(s), Tooth problem(s) Tooth Problems: loose Vision Problems: cataract(s) HEENT Comment: Patient has not called [employer] regarding eye doctor or dentist in network. Confirmed that she does have phone number and will call. Vision problem(s), Tooth problem(s)  Cardiovascular Cardiovascular Symptoms Reported: No symptoms reported Does patient have uncontrolled Hypertension?: Yes Is patient checking Blood Pressure at home?: No Cardiovascular Conditions: Hypertension, High blood cholesterol Cardiovascular Management Strategies: Medication therapy Cardiovascular Comment: Patient has not called [employer] to ask about OTC benefits for BP monitor. Confirmed that she has phone number and encouraged to call.  Respiratory Respiratory Symptoms Reported: Not assesed    Endocrine Patient reports the following symptoms related to hypoglycemia or hyperglycemia : No symptoms reported Is patient diabetic?: Yes Is patient checking blood sugars at home?: Yes Endocrine Conditions: Diabetes Endocrine Management  Strategies: Medication therapy, Routine screening Endocrine Comment: Patient reports she still has not received FreeStyle Libre. Message sent to Arthea Larsson, pharmacist, who has been assisting with DM management. Patient reports education needs regarding diabetic diet. Information to be mailed.  Gastrointestinal Gastrointestinal Symptoms Reported: Not assessed      Genitourinary Genitourinary Symptoms Reported: Not assessed    Integumentary Integumentary Symptoms Reported: Not assessed    Musculoskeletal Musculoskelatal Symptoms Reviewed: Not assessed        Psychosocial Psychosocial Symptoms Reported: Not assessed            09/19/2023    4:12 PM  Depression screen PHQ 2/9  Decreased Interest 0  Down, Depressed, Hopeless 1  PHQ - 2 Score 1    There were no vitals filed for this visit.  Medications Reviewed Today     Reviewed by Valaria Garland, RN (Registered Nurse) on 10/03/23 at 1306  Med List Status: <None>   Medication Order Taking? Sig Documenting Provider Last Dose Status Informant  Accu-Chek Softclix Lancets lancets [id number] No Use as instructed to monitor blood sugar. Dx: E11.65. Jerrlyn Morel, NP Taking Active   albuterol  (VENTOLIN  HFA) 108 (90 Base) MCG/ACT inhaler [id number] No Inhale 2 puffs into the lungs every 4 (four) hours as needed for wheezing. Jerrlyn Morel, NP Taking Active   Blood Pressure Monitoring (BLOOD PRESSURE CUFF) MISC [id number] No Use as directed to monitor blood pressure  Patient not taking: Reported on 08/15/2023   Jerrlyn Morel, NP Not Taking Active            Med Note Francetta Innocent, Flavio Huguenin   Tue Aug 15, 2023 11:41 AM) Has not recieved  cetirizine  (ZYRTEC ) 10 MG tablet [id number] No Take 1 tablet (10 mg total) by mouth daily. Buena Carmine, NP Taking Active  Continuous Glucose Sensor (FREESTYLE LIBRE 3 PLUS SENSOR) MISC [id number] No Change sensor every 15 days. Use to check glucose continuously using the [employer] 3 app.   Patient not taking: Reported on 09/10/2023   Jerrlyn Morel, NP Not Taking Active            Med Note Petrina Bras Sep 10, 2023 10:30 AM) Has not recieved  cyclobenzaprine  (FLEXERIL ) 5 MG tablet [id number] No Take 1 tablet (5 mg total) by mouth 3 (three) times daily as needed for muscle spasms. Paseda, Folashade R, FNP Taking Active   Ferrous Sulfate  90 (18 Fe) MG TABS [id number] No Take 1 tablet by mouth daily. Jerrlyn Morel, NP Taking Active            Med Note (Zilah Villaflor P   Tue Sep 19, 2023  4:00 PM)    fluconazole  (DIFLUCAN ) 150 MG tablet [id number]  TAKE 1 TABLET (150 MG TOTAL) BY MOUTH DAILY. Jerrlyn Morel, NP  Active   folic acid  (FOLVITE ) 800 MCG tablet [id number] No Take 0.5 tablets (400 mcg total) by mouth daily.  Patient not taking: Reported on 09/19/2023   Jerrlyn Morel, NP Not Taking Active   gabapentin  (NEURONTIN ) 300 MG capsule [id number] No TAKE 1 CAPSULE THREE TIMES DAILY Nichols, Tonya S, NP Taking Active   glucose blood (ACCU-CHEK GUIDE TEST) test strip [id number] No Use as instructed to monitor blood sugar. Dx: E11.65. Jerrlyn Morel, NP Taking Active   ibuprofen  (ADVIL ) 800 MG tablet [id number] No Take 1 tablet (800 mg total) by mouth every 8 (eight) hours as needed. Paseda, Folashade R, FNP Taking Active   insulin  glargine (LANTUS ) 100 UNIT/ML Solostar Pen [id number] No Inject 30 Units into the skin 2 (two) times daily. Jerrlyn Morel, NP Taking Active   Insulin  Pen Needle (PEN NEEDLES 3/16") 31G X 5 MM MISC [id number] No Use to inject insulin  twice daily and liraglutide  (Victoza ) once daily. E11.65. Jerrlyn Morel, NP Taking Active   liraglutide  (VICTOZA ) 18 MG/3ML SOPN [id number] No Inject 1.8 mg into the skin daily. Jerrlyn Morel, NP Taking Active   lisinopril  (ZESTRIL ) 20 MG tablet [id number] No Take 1 tablet (20 mg total) by mouth daily. Jerrlyn Morel, NP Taking Active   metFORMIN  (GLUCOPHAGE -XR) 500 MG 24 hr tablet [id number] No Take 2  tablets (1,000 mg total) by mouth 2 (two) times daily with a meal. Jerrlyn Morel, NP Taking Active   naproxen  (NAPROSYN ) 500 MG tablet [id number]  TAKE 1 TABLET TWICE DAILY WITH MEALS Nichols, Tonya S, NP  Active   rosuvastatin  (CRESTOR ) 40 MG tablet [id number] No Take 1 tablet (40 mg total) by mouth daily. Paseda, Folashade R, FNP Taking Active   Semaglutide ,0.25 or 0.5MG /DOS, 2 MG/3ML SOPN [id number] No Inject 0.25 mg into the skin once a week.  Patient not taking: Reported on 09/10/2023   Jerrlyn Morel, NP Not Taking Active            Med Note (Azari Janssens P   Tue Sep 19, 2023  4:01 PM) Will be switching from Victoza  to Ozempic  when her next shipment of medications comes in            Recommendation:   PCP Follow-up Specialty provider follow-up pharmacist 10/09/23 Continue Current Plan of Care Call insurance to ask about dentist and eye doctor within network, OTC benefits for BP cuff  Follow Up Plan:   Telephone follow up appointment date/time:  10/16/23 at 1 PM  Theodora Fish, RN MSN [place]  VBC[facility] Population Health RN Care Manager Direct Dial: [phone]  Fax: [phone]

## 2023-10-03 NOTE — Progress Notes (Signed)
 Outreached patient's pharmacy to investigate with FL3+ sensors had not been shipped. Representative stated that package was returned by the post office and the patient must call in to confirm her address. They would not allow me to confirm her address over the phone, even though they have successfully shipped other medications since the FL3+ sensor was returned.   Per test claim, our [facility] pharmacy is able to mail out a 3 mo supply of sensor for a $0 copay. Will resend the Rx to [facility] for [employer]. I have telephone follow-up with Ms. Edelen next week and will encourage her to bring her supplies to her next PCP appt for application and education.   Arthea Larsson, PharmD PGY1 Pharmacy Resident

## 2023-10-09 ENCOUNTER — Other Ambulatory Visit: Payer: Self-pay

## 2023-10-09 ENCOUNTER — Other Ambulatory Visit (HOSPITAL_COMMUNITY): Payer: Self-pay

## 2023-10-09 DIAGNOSIS — E1165 Type 2 diabetes mellitus with hyperglycemia: Secondary | ICD-10-CM

## 2023-10-09 MED ORDER — OZEMPIC (0.25 OR 0.5 MG/DOSE) 2 MG/3ML ~~LOC~~ SOPN: 0.5000 mg | PEN_INJECTOR | SUBCUTANEOUS | 1 refills | Status: DC

## 2023-10-09 NOTE — Progress Notes (Signed)
 10/09/2023 Name: Evelyn Watts MRN: [id number] DOB: 01/30/1976  Chief Complaint  Patient presents with   Diabetes   Hypertension   Hyperlipidemia    Evelyn Watts is a 48 y.o. year old female who presented for a telephone visit.   They were referred to the pharmacist by their PCP for assistance in managing diabetes, hypertension, and hyperlipidemia.   Subjective: At last pharmacy visit on 09/10/23, patient reported that she was doing well and had current supplies of her medications, despite last fill of Victoza  in Feb 2025 for 30 ds. Her PCP had sent an order for Ozempic  to the pharmacy, but the patient reported that she had not picked this up and was not aware this prescription was sent in. She reported recent issues with transportation to her doctors. She was interested in starting CGM. I was notified on 10/03/23 that she had not received the CGM from her mail order pharmacy - after contacting the pharmacy, they reported that the package was returned by the post office. We then sent the order to [facility] mail order pharmacy instead. Patient was seen by Hillis Lu, NP in clinic on 09/12/23 for acute R shoulder pain. She did not get an A1C, but CBG ws high on her CMP. She was encouraged to pick up Ozempic  from the pharmacy.   Today, patient reports that she recently got a 90ds of most of her maintenance medications via her mail order pharmacy. She reports that she did start Ozempic  and stop Victoza . She does not recall what dose of Ozempic  she was dialing up to, but reports that she ran out ~2 weeks ago. Her main issue today is that she is having a lot of pain in her feet. Sometimes it is burning pain, but she also has bone spurs. Reports that the naproxen  helps for ~30 min, but then the pain returns.   Care Team: Primary Care Provider: Jerrlyn Morel, NP ; Next Scheduled Visit: 10/11/23  Medication Access/Adherence  Current Pharmacy:  [id number] Jonette Nestle, [place] - [address] [place] [address] Phone: [phone] Fax: [phone]  [facility] - [address] [place] - [address] [place] [address] Phone: [phone] Fax: [phone]  Melodee Spruce [facility] - [facility] [address] [place] [address] Phone: [phone] Fax: [phone]   Patient reports affordability concerns with their medications: No  - copays usually $0  Patient reports access/transportation concerns to their pharmacy: Yes  - Still using multiple pharmacies: [facility] (most maintenance meds), [facility] on [address] (Ozempic ), [facility] mail order (FL3+) - Patient reports that she continues to have difficulty obtaining transportation for her appointments. The LCSW emailed her an application for transportation, but she has not been able to print it out. I encouraged her to call [facility] to schedule transportation for her upcoming appt.   Patient reports adherence concerns with their medications:  - Yes - she has an in-home aid that comes to help her with medications and other ADLs every day except Sundays.  - She reports that she ran out of Ozempic  about 2 weeks ago. Does not remember what dose she was dialing up to.    Diabetes:  Current medications: Lantus  30 units twice daily, Ozempic  0.5 mg weekly (Mondays - unsure whether patient started with 0.25 mg or 0.5 mg), metformin  XR 500 mg - 2 tablets in the morning and 1 tablet at night Medications tried in  the past: dulaglutide (upset stomach, nausea), Victoza  (inadequate response)  She denies GI AE since switching from Victoza  to Ozempic . Denies nausea, vomiting, diarrhea, abdominal pain.   Using glucometer; testing in the morning, after meals, and at bedtime  Last reading before bed yesterday: does not recall specific #, but states that it was high last night.   She reports that she is still interested in using Freestyle Libre 3  CGM to monitor BG - test claim showed FL3+ $0 for 90ds through insurance. She has an Designer, industrial/product and could use the [employer] app to monitor her sugars. Instructed patient to bring supplies [facility] for application at appt on 10/11/23, if she receives via mail order.   Patient denies hypoglycemic s/sx including dizziness, shakiness, sweating. Patient denies hyperglycemic symptoms including polyuria, polydipsia, polyphagia, nocturia, neuropathy, blurred vision.  Current meal patterns: 3 meals/day - reports she is mainly baking and boiling her foods. Eating more vegetables - cabbage, stir fry.  - Breakfast: sausage, eggs, grits, oatmeal - Lunch: hamburger with no bread, just lettuce, fruit - Supper: chicken, small portion of rice, greens - Snacks: handful of chips occasionally - Drinks: glass of water  with every meal  Current physical activity: walking in the mornings almost every day for 25-30 minutes with aid. Limited by pain in feet with bone spurs.  Hypertension:  Current medications: lisinopril  20 mg daily (current supply per fill hx)  Hyperlipidemia/ASCVD Risk Reduction  Current lipid lowering medications: rosuvastatin  20 mg daily (current supply per fill hx)  The 10-year ASCVD risk score (Arnett DK, et al., 2019) is: 4.1%   Values used to calculate the score:     Age: 49 years     Clincally relevant sex: Female     Is Non-Hispanic African American: No     Diabetic: Yes     Tobacco smoker: No     Systolic Blood Pressure: 132 mmHg     Is BP treated: Yes     HDL Cholesterol: 30 mg/dL     Total Cholesterol: 163 mg/dL   Objective:  BP Readings from Last 3 Encounters:  09/12/23 132/82  06/08/23 (!) 135/91  03/20/23 (!) 162/87    Lab Results  Component Value Date   HGBA1C 12.8 (A) 06/08/2023    Lab Results  Component Value Date   CREATININE 0.59 09/12/2023   BUN 14 09/12/2023   NA 133 (L) 09/12/2023   K 4.7 09/12/2023   CL 97 09/12/2023   CO2 22  09/12/2023    Lab Results  Component Value Date   CHOL 163 09/12/2023   HDL 30 (L) 09/12/2023   LDLCALC 73 09/12/2023   LDLDIRECT 79 09/12/2023   TRIG 380 (H) 09/12/2023   CHOLHDL 5.4 (H) 09/12/2023    Medications Reviewed Today     Reviewed by Adra Alanis, RPH (Pharmacist) on 10/09/23 at 0927  Med List Status: <None>   Medication Order Taking? Sig Documenting Provider Last Dose Status Informant  Accu-Chek Softclix Lancets lancets [id number]  Use as instructed to monitor blood sugar. Dx: E11.65. Jerrlyn Morel, NP  Active   albuterol  (VENTOLIN  HFA) 108 (90 Base) MCG/ACT inhaler [id number]  Inhale 2 puffs into the lungs every 4 (four) hours as needed for wheezing. Jerrlyn Morel, NP  Active    Patient not taking:   Discontinued 10/09/23 0923 (No longer needed (for PRN medications))            Med Note (Lorice Lafave P  Tue Aug 15, 2023 11:41 AM) Has not recieved  cetirizine  (ZYRTEC ) 10 MG tablet [id number]  Take 1 tablet (10 mg total) by mouth daily. Buena Carmine, NP  Active   Continuous Glucose Sensor (FREESTYLE LIBRE 3 PLUS SENSOR) MISC [id number]  Change sensor every 15 days. Use to check glucose continuously using the [employer] 3 app.  Patient not taking: Reported on 10/09/2023   Jerrlyn Morel, NP  Active   cyclobenzaprine  (FLEXERIL ) 5 MG tablet [id number]  Take 1 tablet (5 mg total) by mouth 3 (three) times daily as needed for muscle spasms. Paseda, Folashade R, FNP  Active   Ferrous Sulfate  90 (18 Fe) MG TABS [id number]  Take 1 tablet by mouth daily. Jerrlyn Morel, NP  Active            Med Note Ladoris Piedra, Meade Spencer   Tue Sep 19, 2023  4:00 PM)      Discontinued 10/09/23 6295 (Completed Course)    Patient not taking:   Discontinued 10/09/23 0923 (Patient has not taken in last 30 days)   gabapentin  (NEURONTIN ) 300 MG capsule [id number] Yes TAKE 1 CAPSULE THREE TIMES DAILY Nichols, Tonya S, NP  Active   glucose blood (ACCU-CHEK GUIDE TEST) test strip [id number]  Use  as instructed to monitor blood sugar. Dx: E11.65. Jerrlyn Morel, NP  Active   ibuprofen  (ADVIL ) 800 MG tablet [id number]  Take 1 tablet (800 mg total) by mouth every 8 (eight) hours as needed. Paseda, Folashade R, FNP  Active   insulin  glargine (LANTUS ) 100 UNIT/ML Solostar Pen [id number] Yes Inject 30 Units into the skin 2 (two) times daily. Jerrlyn Morel, NP  Active   Insulin  Pen Needle (PEN NEEDLES 3/16) 31G X 5 MM MISC [id number]  Use to inject insulin  twice daily and liraglutide  (Victoza ) once daily. E11.65. Jerrlyn Morel, NP  Active     Discontinued 10/09/23 [id number] (Change in therapy)   lisinopril  (ZESTRIL ) 20 MG tablet [id number] Yes Take 1 tablet (20 mg total) by mouth daily. Jerrlyn Morel, NP  Active   metFORMIN  (GLUCOPHAGE -XR) 500 MG 24 hr tablet [id number] Yes Take 2 tablets (1,000 mg total) by mouth 2 (two) times daily with a meal. Jerrlyn Morel, NP  Active   naproxen  (NAPROSYN ) 500 MG tablet [id number] Yes TAKE 1 TABLET TWICE DAILY WITH MEALS Nichols, Tonya S, NP  Active   rosuvastatin  (CRESTOR ) 40 MG tablet [id number] Yes Take 1 tablet (40 mg total) by mouth daily. Paseda, Folashade R, FNP  Active   Semaglutide ,0.25 or 0.5MG /DOS, (OZEMPIC , 0.25 OR 0.5 MG/DOSE,) 2 MG/3ML SOPN [id number] Yes Inject 0.5 mg into the skin once a week. Jerrlyn Morel, NP  Active    Patient not taking:   Discontinued 10/09/23 0924 (Dose change)            Med Note (STEFFENS, MICHELLE P   Tue Sep 19, 2023  4:01 PM) Will be switching from Victoza  to Ozempic  when her next shipment of medications comes in              Assessment/Plan:   Diabetes: - Currently uncontrolled with last A1c 12.8% on 06/08/23, though suspect improvement has patient reported BG ranging from 99-199 mg/dL at last appointment. Patient is due for a repeat A1C, but has not been able to attend appointments due to transportation barriers. She has successfully transitioned from Victoza  to Ozempic , which may improve glycemic  control with appropriate titration. She is tolerating Ozempic  well. Patient continues  to be interested in obtaining CGM. Assisted in facilitating shipment of CGM from [facility].  - Reviewed long term cardiovascular and renal outcomes of uncontrolled blood sugar - Reviewed goal A1c, goal fasting, and goal 2 hour post prandial glucose - Reviewed dietary modifications including drinking more water , focusing on intake of protein and nonstarchy vegetables - Reviewed lifestyle modifications including: increasing physical activity. Commended for daily walking.  - Recommend to increase Ozempic  to 0.5 mg once weekly. Will collaborate with PCP to place orders. Contacted pharmacy to request fill, who reported that they will be able to order Ozempic  today to dispense tomorrow. Communicated this to patient.  - Recommend to continue Lantus  30 units BID. Can consider switching to concentrated insulin  in the future to reduce injection burden.  - Recommend to continue metformin  XR 1000 mg in the AM and 500 mg in the PM - Recommend to check glucose continuously with FL3+ CGM. Test claim showed $0 copay for 90 day supply. Previously sent prescription to preferred pharmacy per standing order, however they reported that they are unable to reship the package without the patient calling to confirm her address. Supplies were resent to [facility] for mail order, and should be shipped out today. Patient was instructed to bring all her CGM supplies for education and application to appt on 10/11/23.  - Recheck A1C at PCP follow-up   Hypertension: - Currently uncontrolled with last office BP 132/82 mmHg, close to goal <130/80. Patient has adequate supply of ACEi. Can consider increasing dose at PCP appt in two days if BP continues to be above goal. Patient did just receive shipment for 90ds.  - Continue lisinopril  20 mg daily - Instructed patient to monitor at home to determine need for additional therapy at previous  visit.    Hyperlipidemia/ASCVD Risk Reduction: - Currently uncontrolled with last LDL (direct) 79 mg/dL, above goal <65 mg/dL but improved from 784 mg/dL. TG elevated to 380 mg/dL, above goal < 696 mg/dL, but improved from 295 mg/dL. Adherence appears appropriate. Patient may benefit from addition of ezetimibe 10 mg daily, but hesitant to increase pill burden given existing issues with adherence and confusion at the pharmacy.  - Continue rosuvastatin  20 mg daily  Provided patient with [facility] phone number to schedule transportation for appointment on 10/11/23. Will collaborate with LCSW to see if we can print transportation application for patient.    Follow Up Plan: PCP 10/11/23, Pharmacist telephone 11/20/23  Arthea Larsson, PharmD PGY1 Pharmacy Resident

## 2023-10-10 ENCOUNTER — Telehealth: Payer: Self-pay | Admitting: Nurse Practitioner

## 2023-10-10 NOTE — Telephone Encounter (Signed)
 Patient would like to have transportation arranged for her appointment on 6/18. Please advise.

## 2023-10-11 ENCOUNTER — Other Ambulatory Visit: Payer: Self-pay

## 2023-10-11 ENCOUNTER — Ambulatory Visit: Payer: Self-pay | Admitting: Nurse Practitioner

## 2023-10-16 ENCOUNTER — Other Ambulatory Visit: Payer: Self-pay

## 2023-10-16 NOTE — Patient Outreach (Signed)
 Complex Care Management   Visit Note  10/16/2023  Name:  Evelyn Watts MRN: [id number] DOB: [birth year]  Situation: Referral received for Complex Care Management related to Diabetes with Complications and HTN I obtained verbal consent from Patient.  Visit completed with Avelina Laster  on the phone  Background:   Past Medical History:  Diagnosis Date   Allergies    Asthma    Diabetes mellitus without complication (HCC)    Essential hypertension     Assessment: Patient Reported Symptoms:  Cognitive Cognitive Status: Able to follow simple commands, Alert and oriented to person, place, and time, Normal speech and language skills Cognitive/Intellectual Conditions Management [RPT]: None reported or documented in medical history or problem list      Neurological Neurological Review of Symptoms: Not assessed    HEENT HEENT Symptoms Reported: No symptoms reported HEENT Conditions: Vision problem(s), Tooth problem(s) Tooth Problems: loose Vision Problems: cataract(s) HEENT Comment: Patient has not called [employer] regarding eye doctor or dentist in network. Confirmed that she does have phone number and will call. Vision problem(s), Tooth problem(s)  Cardiovascular Cardiovascular Symptoms Reported: No symptoms reported Does patient have uncontrolled Hypertension?: Yes Is patient checking Blood Pressure at home?: No Cardiovascular Conditions: High blood cholesterol, Hypertension Cardiovascular Management Strategies: Medication therapy Cardiovascular Comment: Patient has not called [employer] to ask about OTC benefits for BP monitor. Confirmed that she has phone number and encouraged to call.  Respiratory Respiratory Symptoms Reported: Not assesed    Endocrine Patient reports the following symptoms related to hypoglycemia or hyperglycemia : No symptoms reported Is patient diabetic?: Yes Is patient checking blood sugars at home?: Yes Endocrine Conditions: Diabetes Endocrine Management  Strategies: Medical device, Routine screening Endocrine Comment: Patient reports she has not received FreeStyle Libre. Note per chart review Rx was sent to [facility] outpatient pharmacy at [facility] for delivery.  Gastrointestinal Gastrointestinal Symptoms Reported: Not assessed      Genitourinary Genitourinary Symptoms Reported: Not assessed    Integumentary Integumentary Symptoms Reported: Not assessed    Musculoskeletal Musculoskelatal Symptoms Reviewed: Other Other Musculoskeletal Symptoms: Patient reports achiness in bilateral feet. Pain is worse when she goes to sleep at night but is always there. She plans to discuss with PCP at upcoming appointment. Musculoskeletal Management Strategies: Adequate rest Falls in the past year?: No Number of falls in past year: 1 or less Was there an injury with Fall?: No Fall Risk Category Calculator: 0 Patient Fall Risk Level: Low Fall Risk Patient at Risk for Falls Due to: No Fall Risks Fall risk Follow up: Falls evaluation completed, Education provided  Psychosocial Psychosocial Symptoms Reported: Not assessed            09/19/2023    4:12 PM  Depression screen PHQ 2/9  Decreased Interest 0  Down, Depressed, Hopeless 1  PHQ - 2 Score 1    There were no vitals filed for this visit.  Medications Reviewed Today     Reviewed by Arno Rosaline SQUIBB, RN (Registered Nurse) on 10/16/23 at 1305  Med List Status: <None>   Medication Order Taking? Sig Documenting Provider Last Dose Status Informant  Accu-Chek Softclix Lancets lancets [id number] No Use as instructed to monitor blood sugar. Dx: E11.65. Oley Bascom RAMAN, NP Taking Active   albuterol  (VENTOLIN  HFA) 108 (90 Base) MCG/ACT inhaler [id number] No Inhale 2 puffs into the lungs every 4 (four) hours as needed for wheezing. Oley Bascom RAMAN, NP Taking Active   cetirizine  (ZYRTEC ) 10 MG tablet [id number] No Take 1  tablet (10 mg total) by mouth daily. Arloa Suzen RAMAN, NP Taking Active    Continuous Glucose Sensor (FREESTYLE LIBRE 3 PLUS SENSOR) MISC [id number]  Change sensor every 15 days. Use to check glucose continuously using the [employer] 3 app.  Patient not taking: Reported on 10/09/2023   Oley Bascom RAMAN, NP  Active   cyclobenzaprine  (FLEXERIL ) 5 MG tablet [id number] No Take 1 tablet (5 mg total) by mouth 3 (three) times daily as needed for muscle spasms. Paseda, Folashade R, FNP Taking Active   Ferrous Sulfate  90 (18 Fe) MG TABS [id number] No Take 1 tablet by mouth daily. Oley Bascom RAMAN, NP Taking Active            Med Note (Kaiel Weide P   Tue Sep 19, 2023  4:00 PM)    gabapentin  (NEURONTIN ) 300 MG capsule [id number]  TAKE 1 CAPSULE THREE TIMES DAILY Oley Bascom RAMAN, NP  Active   glucose blood (ACCU-CHEK GUIDE TEST) test strip [id number] No Use as instructed to monitor blood sugar. Dx: E11.65. Oley Bascom RAMAN, NP Taking Active   ibuprofen  (ADVIL ) 800 MG tablet [id number] No Take 1 tablet (800 mg total) by mouth every 8 (eight) hours as needed. Paseda, Folashade R, FNP Taking Active   insulin  glargine (LANTUS ) 100 UNIT/ML Solostar Pen [id number]  Inject 30 Units into the skin 2 (two) times daily. Oley Bascom RAMAN, NP  Active   Insulin  Pen Needle (PEN NEEDLES 3/16) 31G X 5 MM MISC [id number] No Use to inject insulin  twice daily and liraglutide  (Victoza ) once daily. E11.65. Oley Bascom RAMAN, NP Taking Active   lisinopril  (ZESTRIL ) 20 MG tablet [id number]  Take 1 tablet (20 mg total) by mouth daily. Oley Bascom RAMAN, NP  Active   metFORMIN  (GLUCOPHAGE -XR) 500 MG 24 hr tablet [id number]  Take 2 tablets (1,000 mg total) by mouth 2 (two) times daily with a meal. Oley Bascom RAMAN, NP  Active   naproxen  (NAPROSYN ) 500 MG tablet [id number]  TAKE 1 TABLET TWICE DAILY WITH MEALS Nichols, Tonya S, NP  Active   rosuvastatin  (CRESTOR ) 40 MG tablet [id number]  Take 1 tablet (40 mg total) by mouth daily. Paseda, Folashade R, FNP  Active   Semaglutide ,0.25 or 0.5MG /DOS, (OZEMPIC , 0.25  OR 0.5 MG/DOSE,) 2 MG/3ML SOPN [id number]  Inject 0.5 mg into the skin once a week. Oley Bascom RAMAN, NP  Active             Recommendation:   Continue Current Plan of Care Patient will call [employer] to ask about OTC benefits for BP cuff and in-network dentist/eye doctor  Follow Up Plan:   Telephone follow up appointment date/time:  11/10/23 at 1:30 PM  Rosaline Finlay, RN MSN [place]  [facility] RN Care Manager Direct Dial: [phone]  Fax: [phone]

## 2023-10-16 NOTE — Patient Instructions (Signed)
 Visit Information  Thank you for taking time to visit with me today. Please don't hesitate to contact me if I can be of assistance to you before our next scheduled appointment.  Your next care management appointment is by telephone on 11/10/23 at 1:30 PM  Please call the care guide team at [phone] if you need to cancel, schedule, or reschedule an appointment.   Please call the Suicide and Crisis Lifeline: 988 call 1-800-273-TALK (toll free, 24 hour hotline) if you are experiencing a Mental Health or Behavioral Health Crisis or need someone to talk to.  Rosaline Finlay, RN MSN [place]  [facility] RN Care Manager Direct Dial: [phone]  Fax: [phone]

## 2023-11-09 ENCOUNTER — Encounter: Payer: Self-pay | Admitting: Nurse Practitioner

## 2023-11-09 ENCOUNTER — Ambulatory Visit (INDEPENDENT_AMBULATORY_CARE_PROVIDER_SITE_OTHER): Admitting: Nurse Practitioner

## 2023-11-09 VITALS — BP 121/74 | HR 82 | Temp 98.2°F | Wt 270.0 lb

## 2023-11-09 DIAGNOSIS — K047 Periapical abscess without sinus: Secondary | ICD-10-CM | POA: Diagnosis not present

## 2023-11-09 DIAGNOSIS — M79672 Pain in left foot: Secondary | ICD-10-CM

## 2023-11-09 DIAGNOSIS — M79671 Pain in right foot: Secondary | ICD-10-CM | POA: Diagnosis not present

## 2023-11-09 DIAGNOSIS — Z1211 Encounter for screening for malignant neoplasm of colon: Secondary | ICD-10-CM | POA: Diagnosis not present

## 2023-11-09 DIAGNOSIS — E1165 Type 2 diabetes mellitus with hyperglycemia: Secondary | ICD-10-CM | POA: Diagnosis not present

## 2023-11-09 DIAGNOSIS — Z794 Long term (current) use of insulin: Secondary | ICD-10-CM

## 2023-11-09 DIAGNOSIS — M25511 Pain in right shoulder: Secondary | ICD-10-CM | POA: Diagnosis not present

## 2023-11-09 LAB — POCT GLYCOSYLATED HEMOGLOBIN (HGB A1C): Hemoglobin A1C: 11.6 % — AB (ref 4.0–5.6)

## 2023-11-09 MED ORDER — GLUCERNA 1.0 CAL PO LIQD: 1.0000 | Freq: Three times a day (TID) | ORAL | 2 refills | Status: DC | PRN

## 2023-11-09 MED ORDER — AMOXICILLIN 875 MG PO TABS: 875.0000 mg | ORAL_TABLET | Freq: Two times a day (BID) | ORAL | 0 refills | Status: AC

## 2023-11-09 MED ORDER — IBUPROFEN 800 MG PO TABS: 800.0000 mg | ORAL_TABLET | Freq: Three times a day (TID) | ORAL | 0 refills | Status: DC | PRN

## 2023-11-09 NOTE — Progress Notes (Addendum)
 Subjective   Patient ID: Evelyn Watts, female    DOB: [birth year], 48 y.o.   MRN: [id number]  Chief Complaint  Patient presents with   Diabetes    Referring provider: Oley Bascom RAMAN, NP  Evelyn Watts is a 48 y.o. female with Past Medical History: No date: Allergies No date: Asthma No date: Diabetes mellitus without complication (HCC) No date: Essential hypertension   HPI  Patient presents today for a follow-up visit.  A1c in office today continues to stay elevated at 11.6.   Patient also complains of abscessed tooth that she needs to go to a dentist for but would like some antibiotics to help until she has an appointment.  Patient is also requesting ibuprofen  for right shoulder pain and left foot pain.  Will place a referral to podiatry. denies f/c/s, n/v/d, hemoptysis, PND, leg swelling. Denies chest pain or edema.      Allergies  Allergen Reactions   Latex Rash   Morphine And Codeine  Rash   Tape Rash    Immunization History  Administered Date(s) Administered   Influenza, Seasonal, Injecte, Preservative Fre 06/08/2023   Influenza,inj,Quad PF,6+ Mos 02/14/2018    Tobacco History: Social History   Tobacco Use  Smoking Status Never  Smokeless Tobacco Never   Counseling given: Not Answered   Outpatient Encounter Medications as of 11/09/2023  Medication Sig   Accu-Chek Softclix Lancets lancets Use as instructed to monitor blood sugar. Dx: E11.65.   albuterol  (VENTOLIN  HFA) 108 (90 Base) MCG/ACT inhaler Inhale 2 puffs into the lungs every 4 (four) hours as needed for wheezing.   amoxicillin  (AMOXIL ) 875 MG tablet Take 1 tablet (875 mg total) by mouth 2 (two) times daily for 10 days.   cetirizine  (ZYRTEC ) 10 MG tablet Take 1 tablet (10 mg total) by mouth daily.   Continuous Glucose Sensor (FREESTYLE LIBRE 3 PLUS SENSOR) MISC Change sensor every 15 days. Use to check glucose continuously using the Freestyle Libre 3 app.   cyclobenzaprine  (FLEXERIL ) 5 MG  tablet Take 1 tablet (5 mg total) by mouth 3 (three) times daily as needed for muscle spasms.   Ferrous Sulfate  90 (18 Fe) MG TABS Take 1 tablet by mouth daily.   gabapentin  (NEURONTIN ) 300 MG capsule TAKE 1 CAPSULE THREE TIMES DAILY   glucose blood (ACCU-CHEK GUIDE TEST) test strip Use as instructed to monitor blood sugar. Dx: E11.65.   insulin  glargine (LANTUS ) 100 UNIT/ML Solostar Pen Inject 30 Units into the skin 2 (two) times daily.   Insulin  Pen Needle (PEN NEEDLES 3/16) 31G X 5 MM MISC Use to inject insulin  twice daily and liraglutide  (Victoza ) once daily. E11.65.   lisinopril  (ZESTRIL ) 20 MG tablet Take 1 tablet (20 mg total) by mouth daily.   metFORMIN  (GLUCOPHAGE -XR) 500 MG 24 hr tablet Take 2 tablets (1,000 mg total) by mouth 2 (two) times daily with a meal.   naproxen  (NAPROSYN ) 500 MG tablet TAKE 1 TABLET TWICE DAILY WITH MEALS   Nutritional Supplements (GLUCERNA 1.0 CAL) LIQD Take 1 Bottle by mouth 3 (three) times daily as needed.   rosuvastatin  (CRESTOR ) 40 MG tablet Take 1 tablet (40 mg total) by mouth daily.   Semaglutide ,0.25 or 0.5MG /DOS, (OZEMPIC , 0.25 OR 0.5 MG/DOSE,) 2 MG/3ML SOPN Inject 0.5 mg into the skin once a week.   [DISCONTINUED] ibuprofen  (ADVIL ) 800 MG tablet Take 1 tablet (800 mg total) by mouth every 8 (eight) hours as needed.   ibuprofen  (ADVIL ) 800 MG tablet Take 1 tablet (800 mg total) by  mouth every 8 (eight) hours as needed.   No facility-administered encounter medications on file as of 11/09/2023.    Review of Systems  Review of Systems  Constitutional: Negative.   HENT: Negative.    Cardiovascular: Negative.   Gastrointestinal: Negative.   Allergic/Immunologic: Negative.   Neurological: Negative.   Psychiatric/Behavioral: Negative.       Objective:   BP 121/74   Pulse 82   Temp 98.2 F (36.8 C) (Oral)   Wt 270 lb (122.5 kg)   SpO2 100%   BMI 37.66 kg/m   Wt Readings from Last 5 Encounters:  11/09/23 270 lb (122.5 kg)  09/12/23 265 lb  (120.2 kg)  06/08/23 265 lb 3.2 oz (120.3 kg)  03/20/23 260 lb (117.9 kg)  03/08/23 262 lb 8 oz (119.1 kg)     Physical Exam Vitals and nursing note reviewed.  Constitutional:      General: She is not in acute distress.    Appearance: She is well-developed.  Cardiovascular:     Rate and Rhythm: Normal rate and regular rhythm.  Pulmonary:     Effort: Pulmonary effort is normal.     Breath sounds: Normal breath sounds.  Neurological:     Mental Status: She is alert and oriented to person, place, and time.       Assessment & Plan:   Type 2 diabetes mellitus with hyperglycemia, with long-term current use of insulin  (HCC) -     POCT glycosylated hemoglobin (Hb A1C) -     AMB Referral VBCI Care Management -     Ambulatory referral to Podiatry  Screening for colon cancer -     Cologuard  Bilateral foot pain -     For home use only DME Other see comment  Dental abscess -     Amoxicillin ; Take 1 tablet (875 mg total) by mouth 2 (two) times daily for 10 days.  Dispense: 20 tablet; Refill: 0  Left foot pain -     Ambulatory referral to Podiatry  Acute pain of right shoulder -     Ibuprofen ; Take 1 tablet (800 mg total) by mouth every 8 (eight) hours as needed.  Dispense: 30 tablet; Refill: 0  Other orders -     Glucerna 1.0 Cal; Take 1 Bottle by mouth 3 (three) times daily as needed.  Dispense: 1000 mL; Refill: 2     Return in about 3 months (around 02/09/2024).    Bascom GORMAN Borer, NP 11/09/2023

## 2023-11-10 ENCOUNTER — Telehealth: Payer: Self-pay

## 2023-11-10 NOTE — Patient Outreach (Signed)
 Care Coordination   11/10/2023 Name: Evelyn Watts MRN: [id number] DOB: 19-Mar-1976   Care Coordination Outreach Attempts:  An unsuccessful telephone outreach was attempted today to complete CMRN follow-up visit.  Follow Up Plan:  Additional outreach attempts will be made to complete CMRN follow-up visit.   Encounter Outcome:  No Answer. HIPAA compliant voicemail left asking for call back.    Rosaline Finlay, RN MSN [place]  [facility] Population Health RN Care Manager Direct Dial: [phone]  Fax: [phone]

## 2023-11-13 ENCOUNTER — Other Ambulatory Visit: Payer: Self-pay

## 2023-11-13 NOTE — Patient Instructions (Signed)

## 2023-11-13 NOTE — Patient Outreach (Signed)
 BSW contacted Evelyn Watts to follow up on the resources previously sent. Evelyn Watts confirmed that she had received the materials. BSW advised Evelyn Watts to reach out if she had any questions or needed further assistance. During the call , Evelyn Watts inquired about her prescription. BSW advised her to reach out to her pharmacy directly for assistance with her medication.   Evelyn Watts, BSW [place]  [facility] Social Worker, [facility] [phone]

## 2023-11-20 ENCOUNTER — Other Ambulatory Visit (HOSPITAL_COMMUNITY): Payer: Self-pay

## 2023-11-20 ENCOUNTER — Telehealth: Payer: Self-pay

## 2023-11-20 ENCOUNTER — Other Ambulatory Visit: Payer: Self-pay

## 2023-11-20 DIAGNOSIS — E1165 Type 2 diabetes mellitus with hyperglycemia: Secondary | ICD-10-CM

## 2023-11-20 MED ORDER — SEMAGLUTIDE (1 MG/DOSE) 4 MG/3ML ~~LOC~~ SOPN: 1.0000 mg | PEN_INJECTOR | SUBCUTANEOUS | 2 refills | Status: DC

## 2023-11-20 NOTE — Progress Notes (Signed)
 11/20/2023 Name: Evelyn Watts MRN: [id number] DOB: 06-18-1975  Chief Complaint  Patient presents with   Diabetes   Hypertension    Evelyn Watts is a 48 y.o. year old female who presented for a telephone visit.   They were referred to the pharmacist by their PCP for assistance in managing diabetes, hypertension, and hyperlipidemia.   Subjective: At last pharmacy telephone appt on 10/09/23, patient reported that she started Ozempic  and stopped Victoza . However, she had run out of Ozempic  ~2 weeks ago. She was instructed to increase Ozempic  to 0.5 mg weekly. She was dispensed a 3 mo supply of FL3+ sensors on 10/09/23 from [facility] pharmacy. She was seen by her PCP, Bascom Borer, NP on 11/09/23. A1C had decreased slightly from 12.8% to 11.6%. Her BP was controlled at 121/74 mmHg.   Today, patient reports doing well. She has been out of her Ozempic  for ~2 weeks. Reports she does not currently have a ride for the pharmacy, so would prefer to receive a 3 mo supply in the mail. She denies missed doses of her other medications. She reports that she was tolerating Ozempic  0.5 mg well without any GI issues (nausea, vomiting, constipation, diarrhea, abdominal pain).   Care Team: Primary Care Provider: Borer Bascom RAMAN, NP ; Next Scheduled Visit: 02/09/24  Medication Access/Adherence  Current Pharmacy:  [id number] [address], [place] - [address] AT [address] [place] [address] Phone: [phone] Fax: [phone]  [facility] - [address] [place] - [address] [place] [address] Phone: [phone] Fax: [phone]  DARRYLE LONG - [facility] [address] [place] [address] Phone: [phone] Fax: [phone]   Patient reports affordability concerns with their medications: No  - copays usually $0  Patient reports access/transportation concerns to their pharmacy: Yes  -  Still using multiple pharmacies: [facility] (most maintenance meds), [facility] on [address] (Ozempic ), [facility] mail order (FL3+) - Patient reports that she continues to have difficulty obtaining transportation for her appointments - working with LCSW - She never received FL3+ CGM from [facility] pharmacy (mailed out 10/09/23) - thinks someone likely stole the package from her porch  Patient reports adherence concerns with their medications:  - Yes - she has an in-home aid that comes to help her with medications and other ADLs every day except Sundays.  - She reports that she ran out of Ozempic  about 2 weeks ago. She confirms she was taking 0.5 mg prior to running out.   Diabetes:  Current medications: Lantus  30 units twice daily, Ozempic  0.5 mg weekly (Mondays), metformin  XR 500 mg - 2 tablets in the morning and 1 tablet at night Medications tried in the past: dulaglutide (upset stomach, nausea), Victoza  (inadequate response)  She denies GI AE since switching from Victoza  to Ozempic . Denies nausea, vomiting, diarrhea, abdominal pain.   Using glucometer; testing in the morning, after meals, and at bedtime  This AM (before eating) - 120 mg/dL. Does not recall any additional numbers. Unable to access her glucometer for more detailed review.   Patient did not receive FL3+ via mail order. Would have to pursue insurance override for refill before Sept 2025.   Patient denies hypoglycemic s/sx including dizziness, shakiness, sweating. Patient denies hyperglycemic symptoms including polyuria, polydipsia, polyphagia, nocturia, neuropathy, blurred vision.  Current meal patterns: 3 meals/day - reports she is mainly baking and boiling her foods. Eating more vegetables - cabbage, stir fry.  -  Breakfast: sausage, eggs, grits, oatmeal - Lunch: hamburger with no bread, just lettuce, fruit - Supper: chicken, small portion of rice, greens - Snacks: handful of chips occasionally - Drinks: glass of water  with every  meal  Current physical activity: walking in the mornings almost every day for 25-30 minutes with aid. Limited by pain in feet with bone spurs. She asks about whether DME orders were placed for rollator with chair.  Hypertension:  Current medications: lisinopril  20 mg daily (current supply per fill hx)  Hyperlipidemia/ASCVD Risk Reduction  Current lipid lowering medications: rosuvastatin  20 mg daily (current supply per fill hx)  The 10-year ASCVD risk score (Arnett DK, et al., 2019) is: 3.7%   Values used to calculate the score:     Age: 13 years     Clincally relevant sex: Female     Is Non-Hispanic African American: No     Diabetic: Yes     Tobacco smoker: No     Systolic Blood Pressure: 121 mmHg     Is BP treated: Yes     HDL Cholesterol: 30 mg/dL     Total Cholesterol: 163 mg/dL   Objective:  BP Readings from Last 3 Encounters:  11/09/23 121/74  09/12/23 132/82  06/08/23 (!) 135/91    Lab Results  Component Value Date   HGBA1C 11.6 (A) 11/09/2023   HGBA1C 12.8 (A) 06/08/2023   HGBA1C 12.6 (A) 03/20/2023    Lab Results  Component Value Date   CREATININE 0.59 09/12/2023   BUN 14 09/12/2023   NA 133 (L) 09/12/2023   K 4.7 09/12/2023   CL 97 09/12/2023   CO2 22 09/12/2023    Lab Results  Component Value Date   CHOL 163 09/12/2023   HDL 30 (L) 09/12/2023   LDLCALC 73 09/12/2023   LDLDIRECT 79 09/12/2023   TRIG 380 (H) 09/12/2023   CHOLHDL 5.4 (H) 09/12/2023    Medications Reviewed Today     Reviewed by Brinda Lorain SQUIBB, RPH (Pharmacist) on 11/20/23 at 1027  Med List Status: <None>   Medication Order Taking? Sig Documenting Provider Last Dose Status Informant  Accu-Chek Softclix Lancets lancets [id number]  Use as instructed to monitor blood sugar. Dx: E11.65. Oley Bascom RAMAN, NP  Active   albuterol  (VENTOLIN  HFA) 108 (90 Base) MCG/ACT inhaler [id number]  Inhale 2 puffs into the lungs every 4 (four) hours as needed for wheezing. Oley Bascom RAMAN, NP  Active    cetirizine  (ZYRTEC ) 10 MG tablet [id number]  Take 1 tablet (10 mg total) by mouth daily. Arloa Suzen RAMAN, NP  Active   Continuous Glucose Sensor (FREESTYLE LIBRE 3 PLUS SENSOR) MISC [id number]  Change sensor every 15 days. Use to check glucose continuously using the [employer] 3 app.  Patient not taking: Reported on 11/20/2023   Oley Bascom RAMAN, NP  Active   cyclobenzaprine  (FLEXERIL ) 5 MG tablet [id number]  Take 1 tablet (5 mg total) by mouth 3 (three) times daily as needed for muscle spasms. Paseda, Folashade R, FNP  Active   Ferrous Sulfate  90 (18 Fe) MG TABS [id number]  Take 1 tablet by mouth daily. Oley Bascom RAMAN, NP  Active            Med Note (STEFFENS, MICHELLE P   Tue Sep 19, 2023  4:00 PM)    gabapentin  (NEURONTIN ) 300 MG capsule [id number]  TAKE 1 CAPSULE THREE TIMES DAILY Oley Bascom RAMAN, NP  Active   glucose blood (ACCU-CHEK GUIDE TEST) test strip [id number]  Use  as instructed to monitor blood sugar. Dx: E11.65. Oley Bascom RAMAN, NP  Active   ibuprofen  (ADVIL ) 800 MG tablet [id number]  Take 1 tablet (800 mg total) by mouth every 8 (eight) hours as needed. Oley Bascom RAMAN, NP  Active   insulin  glargine (LANTUS ) 100 UNIT/ML Solostar Pen [id number] Yes Inject 30 Units into the skin 2 (two) times daily. Oley Bascom RAMAN, NP  Active   Insulin  Pen Needle (PEN NEEDLES 3/16) 31G X 5 MM MISC [id number]  Use to inject insulin  twice daily and liraglutide  (Victoza ) once daily. E11.65. Oley Bascom RAMAN, NP  Active   lisinopril  (ZESTRIL ) 20 MG tablet [id number] Yes Take 1 tablet (20 mg total) by mouth daily. Oley Bascom RAMAN, NP  Active   metFORMIN  (GLUCOPHAGE -XR) 500 MG 24 hr tablet [id number] Yes Take 2 tablets (1,000 mg total) by mouth 2 (two) times daily with a meal. Oley Bascom RAMAN, NP  Active   naproxen  (NAPROSYN ) 500 MG tablet [id number]  TAKE 1 TABLET TWICE DAILY WITH MEALS Nichols, Tonya S, NP  Active   Nutritional Supplements (GLUCERNA 1.0 CAL) LIQD [id number]  Take 1 Bottle by mouth 3  (three) times daily as needed. Oley Bascom RAMAN, NP  Active   rosuvastatin  (CRESTOR ) 40 MG tablet [id number] Yes Take 1 tablet (40 mg total) by mouth daily. Paseda, Folashade R, FNP  Active   Semaglutide , 1 MG/DOSE, 4 MG/3ML SOPN [id number] Yes Inject 1 mg into the skin once a week. Oley Bascom RAMAN, NP  Active    Discontinued 11/20/23 [phone] (Dose change)               Assessment/Plan:   Diabetes: - Currently uncontrolled with last A1c 11.6% above goal < 7%, but improved from 12.8% on 06/08/23. Patient would benefit from CGM monitoring, but unfortunately her shipment for the FL3+ sensors was lost. She is tolerating Ozempic  0.5 mg weekly well, so will plan to increase to 1 mg weekly at next fill. Will provide with 3 mo supply via mail order to promote adherence.  - Reviewed long term cardiovascular and renal outcomes of uncontrolled blood sugar - Reviewed goal A1c, goal fasting, and goal 2 hour post prandial glucose - Reviewed dietary modifications including drinking more water , focusing on intake of protein and nonstarchy vegetables - Reviewed lifestyle modifications including: increasing physical activity. Commended for daily walking.  - Recommend to increase Ozempic  to 1 mg once weekly. Will collaborate with PCP to place orders. - Recommend to continue Lantus  30 units BID. Can consider switching to concentrated insulin  in the future to reduce injection burden.  - Recommend to continue metformin  XR 1000 mg in the AM and 500 mg in the PM - Recommend to check glucose twice daily with glucometer. Will re-attempt to access CGM at follow-up.  - A1C due Oct 2025   Hypertension: - Currently controlled with last office BP 121/74 mmHg controlled belwo goal <130/80. Patient has adequate supply of ACEi.  - Continue lisinopril  20 mg daily   Hyperlipidemia/ASCVD Risk Reduction: - Currently uncontrolled with last LDL 73 mg/dL, above goal <29 mg/dL but improved from 889 mg/dL. TG elevated to 380  mg/dL, above goal < 849 mg/dL, but improved from 543 mg/dL. Adherence appears appropriate. Patient may benefit from addition of ezetimibe 10 mg daily, but hesitant to increase pill burden given existing issues with adherence and confusion at the pharmacy.  - Continue rosuvastatin  20 mg daily   Follow Up Plan:  Pharmacist telephone 12/27/23, PCP 02/09/24  Lorain Baseman, PharmD  PGY1 Pharmacy Resident

## 2023-11-20 NOTE — Progress Notes (Signed)
 During pharmacy telephone appointment today, Ms. Winzer requested update on DME orders for a Rollator with chair. Will communicate with primary care office to communicate updates to patient.  Lorain Baseman, PharmD [facility] [phone]

## 2023-11-21 ENCOUNTER — Other Ambulatory Visit: Payer: Self-pay

## 2023-11-21 DIAGNOSIS — M79671 Pain in right foot: Secondary | ICD-10-CM

## 2023-11-22 ENCOUNTER — Other Ambulatory Visit

## 2023-11-22 NOTE — Patient Outreach (Signed)
 Care Coordination   11/22/2023 Name: Delecia Vastine MRN: [id number] DOB: 12/15/1975   Care Coordination Outreach Attempts: A second unsuccessful outreach was attempted to complete CCM follow-up visit.   Follow Up Plan:  Additional outreach attempts will be made to complete follow-up visit.   Encounter Outcome:  No Answer. HIPAA compliant voicemail left asking for return call.   Rosaline Finlay, RN MSN [place]  VBCI Population Health RN Care Manager Direct Dial: [phone]  Fax: [phone]

## 2023-11-22 NOTE — Patient Outreach (Signed)
 Complex Care Management   Visit Note  11/22/2023  Name:  Evelyn Watts MRN: [id number] DOB: 05-05-1975  Situation: Referral received for Complex Care Management related to Diabetes with Complications and HTN I obtained verbal consent from Patient.  Visit completed with Avelina Laster  on the phone  Background:   Past Medical History:  Diagnosis Date   Allergies    Asthma    Diabetes mellitus without complication (HCC)    Essential hypertension     Assessment: Patient Reported Symptoms:  Cognitive Cognitive Status: Able to follow simple commands, Alert and oriented to person, place, and time, Normal speech and language skills Cognitive/Intellectual Conditions Management [RPT]: None reported or documented in medical history or problem list      Neurological Neurological Review of Symptoms: Not assessed    HEENT        Cardiovascular Cardiovascular Symptoms Reported: No symptoms reported Does patient have uncontrolled Hypertension?: No Cardiovascular Management Strategies: Medication therapy Cardiovascular Comment: Patient reports she does have an OTC benefit through [facility] [phone]). Advised that she should be able to purchase a BP cuff with that benefit. She plans to call first thing tomorrow morning.  Respiratory      Endocrine Endocrine Symptoms Reported: No symptoms reported Is patient diabetic?: Yes Is patient checking blood sugars at home?: Yes List most recent blood sugar readings, include date and time of day: Fingersticks before meals and at bedtime. Last reading 120 or 130 per patient. Endocrine Comment: Patient reports she thinks FreeStyle Herlene was sent to the wrong address. She spoke with a representative from [facility] and it appears they had the wrong wrong address. For this reason, she is also out of Ozempic . Message sent to pharmacist asking to reach out to patient at earliest convenience to make sure this issue is settled appropriately.  Gastrointestinal  Gastrointestinal Symptoms Reported: No symptoms reported   Nutrition Risk Screen (CP): No indicators present  Genitourinary Genitourinary Symptoms Reported: Not assessed    Integumentary Integumentary Symptoms Reported: Not assessed    Musculoskeletal Musculoskelatal Symptoms Reviewed: No symptoms reported Musculoskeletal Management Strategies: Adequate rest, Medical device, Exercise Musculoskeletal Comment: Patient reports that she received rollator today. She was able to do a big walk now today and only had to stop once to drink some water . Falls in the past year?: No Number of falls in past year: 1 or less Was there an injury with Fall?: No Fall Risk Category Calculator: 0 Patient Fall Risk Level: Low Fall Risk Patient at Risk for Falls Due to: No Fall Risks Fall risk Follow up: Falls evaluation completed, Education provided  Psychosocial Psychosocial Symptoms Reported: Not assessed            09/19/2023    4:12 PM  Depression screen PHQ 2/9  Decreased Interest 0  Down, Depressed, Hopeless 1  PHQ - 2 Score 1    There were no vitals filed for this visit.  Medications Reviewed Today   Medications were not reviewed in this encounter     Recommendation:   Continue Current Plan of Care  Follow Up Plan:   Telephone follow up appointment date/time:  12/20/23 at 2:30 PM  Rosaline Finlay, RN MSN [place]  [facility] RN Care Manager Direct Dial: [phone]  Fax: [phone]

## 2023-11-22 NOTE — Patient Instructions (Signed)
 Visit Information  Thank you for taking time to visit with me today. Please don't hesitate to contact me if I can be of assistance to you before our next scheduled appointment.  Your next care management appointment is by telephone on 12/20/23 at 2:30 PM  Please call the care guide team at [phone] if you need to cancel, schedule, or reschedule an appointment.   Please call the Suicide and Crisis Lifeline: 988 call 1-800-273-TALK (toll free, 24 hour hotline) if you are experiencing a Mental Health or Behavioral Health Crisis or need someone to talk to.  Rosaline Finlay, RN MSN [place]  VBCI Population Health RN Care Manager Direct Dial: [phone]  Fax: [phone]

## 2023-11-23 ENCOUNTER — Other Ambulatory Visit (HOSPITAL_COMMUNITY): Payer: Self-pay

## 2023-11-23 ENCOUNTER — Telehealth: Payer: Self-pay

## 2023-11-23 NOTE — Progress Notes (Signed)
 Care Manager, Rosaline Finlay, RN notified me that Ms. Mavis was having issues with her [employer] shipments. Completed three way call with patient and pharmacy to confirm correct address was used. A 3 mo supply of Ozmepic was shipped out on 11/22/23. Obtained [employer] Tracking information [phone]). Will follow-up next week to ensure patient received delivery.  Freestyle Libre 3 CGMs were shipped out for a 3 mo supply by [facility] back in June. Patient states she did not receive shipment. Unfortunately, will not be able to re-bill insurance until September. Will revisit at that time.   Lorain Baseman, PharmD [facility] [phone]

## 2023-12-12 ENCOUNTER — Ambulatory Visit (INDEPENDENT_AMBULATORY_CARE_PROVIDER_SITE_OTHER)

## 2023-12-12 ENCOUNTER — Encounter: Payer: Self-pay | Admitting: Podiatry

## 2023-12-12 ENCOUNTER — Ambulatory Visit (INDEPENDENT_AMBULATORY_CARE_PROVIDER_SITE_OTHER): Admitting: Podiatry

## 2023-12-12 DIAGNOSIS — M7751 Other enthesopathy of right foot: Secondary | ICD-10-CM | POA: Diagnosis not present

## 2023-12-12 DIAGNOSIS — M722 Plantar fascial fibromatosis: Secondary | ICD-10-CM | POA: Diagnosis not present

## 2023-12-12 DIAGNOSIS — M778 Other enthesopathies, not elsewhere classified: Secondary | ICD-10-CM

## 2023-12-12 DIAGNOSIS — M7752 Other enthesopathy of left foot: Secondary | ICD-10-CM | POA: Diagnosis not present

## 2023-12-12 DIAGNOSIS — M25572 Pain in left ankle and joints of left foot: Secondary | ICD-10-CM | POA: Diagnosis not present

## 2023-12-12 DIAGNOSIS — M25571 Pain in right ankle and joints of right foot: Secondary | ICD-10-CM

## 2023-12-12 DIAGNOSIS — M7732 Calcaneal spur, left foot: Secondary | ICD-10-CM | POA: Diagnosis not present

## 2023-12-12 DIAGNOSIS — M7731 Calcaneal spur, right foot: Secondary | ICD-10-CM

## 2023-12-12 MED ORDER — TRIAMCINOLONE ACETONIDE 10 MG/ML IJ SUSP
10.0000 mg | Freq: Once | INTRAMUSCULAR | Status: AC
  Administered 2023-12-12: 10 mg

## 2023-12-12 NOTE — Progress Notes (Signed)
 Patient presents today with complaint of pain on the heels bilaterally left is greater than right.  Also complains of pain around the fifth metatarsal phalangeal joint bilaterally.  This been bothering her for several months also.  Said the heel pain seems of started first.  Also indicates pain around the area of the sinus tarsi bilaterally.  Does not recall any injury to the foot.  Has not noticed any redness.  Seems to have more swelling than usual   Physical exam:  General appearance: Pleasant, and in no acute distress. AOx3.  Vascular: Pedal pulses: DP 2/4 bilaterally, PT 2/4 bilaterally. Modearate edema lower legs bilaterally. Capillary fill time immediate b/l.[name]  Neurological: Light touch intact feet bilaterally.  Normal Achilles reflex bilaterally.  No clonus or spasticity noted.  Negative Tinel's sign at tarsal tunnel and porta pedis bilaterally  Dermatologic:   Skin normal temperature bilaterally.  Skin normal color, tone, and texture bilaterally.   Musculoskeletal: Tailors bunion deformities bilaterally with tenderness around the fifth metatarsal medial joint bilaterally.  Tenderness mostly in the plantar and lateral aspect of the fifth MTP bilaterally.  Tenderness of the plantar medial aspect of the heel bilaterally.  Tenderness at the plantar medial calcaneal tubercle..  Tenderness palpation in the sinus tarsi pain with range of motion subtalar joint bilaterally  Radiographs: 3 views feet bilaterally: Osteophytic changes plantar and posterior aspect calcaneus.  Thickening of plantar fascia.  No dislocations.  Tailor's bunion bilaterally with increased fourth fifth intermetatarsal angle.  Splayfoot deformity bilaterally.  Normal bone density.  No assenting bone tumors.   Diagnosis: 1.  Arthralgia fifth MTP bilaterally. 2.  Plantar fasciitis bilaterally (left greater than right). 3.  Calcaneal spur bilaterally  Plan: -New patient office visit for evaluation and management level  3.  Modifier 25. - Discussed with her at length plantar fasciitis etiology and treatment, tailor's bunion deformities and conservative versus surgical treatment.  Also discussed with her the arthralgia subtalar joint.  Recommend wearing good supportive shoes with good cushioning. -Written and oral exercises for plantar fasciitis given. -injected 3cc 2:1 mixture 0.5 cc Marcaine:Kenolog 10mg /72ml at plantar fascia origin of left at the medial plantar calcaneal tubercle right..    Return 2 weeks follow-up injection plantar fascia LT

## 2023-12-12 NOTE — Patient Instructions (Signed)

## 2023-12-20 ENCOUNTER — Other Ambulatory Visit: Payer: Self-pay

## 2023-12-20 NOTE — Patient Outreach (Signed)
 Care Coordination   12/20/2023 Name: Evelyn Watts MRN: [id number] DOB: [birth year]   Care Coordination Outreach Attempts:  An unsuccessful outreach was attempted for an appointment today.  Follow Up Plan:  Additional outreach attempts will be made to complete CCM follow-up visit.   Encounter Outcome:  No Answer. HIPAA compliant voicemail left requesting return call.   Rosaline Finlay, RN MSN [place]  [facility] Population Health RN Care Manager Direct Dial: [phone]  Fax: [phone]

## 2023-12-20 NOTE — Patient Outreach (Signed)
 Complex Care Management   Visit Note  12/20/2023  Name:  Evelyn Watts MRN: [id number] DOB: 01-Aug-1975  Situation: Referral received for Complex Care Management related to Diabetes with Complications and HTN I obtained verbal consent from Patient.  Visit completed with Patient  on the phone  Background:   Past Medical History:  Diagnosis Date   Allergies    Asthma    Diabetes mellitus without complication (HCC)    Essential hypertension     Assessment: Patient Reported Symptoms:  Cognitive Cognitive Status: Able to follow simple commands, Alert and oriented to person, place, and time, Normal speech and language skills Cognitive/Intellectual Conditions Management [RPT]: None reported or documented in medical history or problem list      Neurological Neurological Review of Symptoms: No symptoms reported    HEENT HEENT Symptoms Reported: Not assessed      Cardiovascular Cardiovascular Symptoms Reported: No symptoms reported Does patient have uncontrolled Hypertension?: No Is patient checking Blood Pressure at home?: No Cardiovascular Management Strategies: Medication therapy Cardiovascular Comment: Patient reports she has been trying to contact [employer] to ask about OTC benefit for BP monitor.  Respiratory Respiratory Symptoms Reported: No symptoms reported    Endocrine Endocrine Symptoms Reported: No symptoms reported Is patient diabetic?: Yes Is patient checking blood sugars at home?: Yes List most recent blood sugar readings, include date and time of day: Fingersticks before meals and at bedtime. 119-130 per patient    Gastrointestinal Gastrointestinal Symptoms Reported: No symptoms reported Gastrointestinal Management Strategies: Diet modification Gastrointestinal Comment: Patient notes she would like to learn more about a diabetic diet. Verbally provided diabetic diet education. Advised that she visit the American Diabetes Association website for additional resources.     Genitourinary Genitourinary Symptoms Reported: No symptoms reported    Integumentary Integumentary Symptoms Reported: No symptoms reported    Musculoskeletal Musculoskelatal Symptoms Reviewed: No symptoms reported Musculoskeletal Management Strategies: Medical device, Adequate rest, Exercise (Rollator) Musculoskeletal Comment: Patient reports she has been doing a lot of walking now that she has her rollator. She reports she is feeling much better being able to incorporate more walking into her lifestyle. Falls in the past year?: No Number of falls in past year: 1 or less Was there an injury with Fall?: No Fall Risk Category Calculator: 0 Patient Fall Risk Level: Low Fall Risk Patient at Risk for Falls Due to: No Fall Risks Fall risk Follow up: Falls evaluation completed, Education provided  Psychosocial Psychosocial Symptoms Reported: No symptoms reported          12/20/2023    PHQ2-9 Depression Screening   Little interest or pleasure in doing things    Feeling down, depressed, or hopeless    PHQ-2 - Total Score    Trouble falling or staying asleep, or sleeping too much    Feeling tired or having little energy    Poor appetite or overeating     Feeling bad about yourself - or that you are a failure or have let yourself or your family down    Trouble concentrating on things, such as reading the newspaper or watching television    Moving or speaking so slowly that other people could have noticed.  Or the opposite - being so fidgety or restless that you have been moving around a lot more than usual    Thoughts that you would be better off dead, or hurting yourself in some way    PHQ2-9 Total Score    If you checked off any problems, how  difficult have these problems made it for you to do your work, take care of things at home, or get along with other people    Depression Interventions/Treatment      There were no vitals filed for this visit.  Medications Reviewed Today      Reviewed by Arno Rosaline SQUIBB, RN (Registered Nurse) on 12/20/23 at 1640  Med List Status: <None>   Medication Order Taking? Sig Documenting Provider Last Dose Status Informant  Accu-Chek Softclix Lancets lancets [id number]  Use as instructed to monitor blood sugar. Dx: E11.65. Oley Bascom RAMAN, NP  Active   albuterol  (VENTOLIN  HFA) 108 (90 Base) MCG/ACT inhaler [id number]  Inhale 2 puffs into the lungs every 4 (four) hours as needed for wheezing. Oley Bascom RAMAN, NP  Active   cetirizine  (ZYRTEC ) 10 MG tablet [id number]  Take 1 tablet (10 mg total) by mouth daily. Arloa Suzen RAMAN, NP  Active   Continuous Glucose Sensor (FREESTYLE LIBRE 3 PLUS SENSOR) MISC [id number]  Change sensor every 15 days. Use to check glucose continuously using the [employer] 3 app. Oley Bascom RAMAN, NP  Active   cyclobenzaprine  (FLEXERIL ) 5 MG tablet [id number]  Take 1 tablet (5 mg total) by mouth 3 (three) times daily as needed for muscle spasms. Paseda, Folashade R, FNP  Active   Ferrous Sulfate  90 (18 Fe) MG TABS [id number]  Take 1 tablet by mouth daily. Oley Bascom RAMAN, NP  Active            Med Note (Talik Casique P   Tue Sep 19, 2023  4:00 PM)    gabapentin  (NEURONTIN ) 300 MG capsule [id number]  TAKE 1 CAPSULE THREE TIMES DAILY Oley Bascom RAMAN, NP  Active   glucose blood (ACCU-CHEK GUIDE TEST) test strip [id number]  Use as instructed to monitor blood sugar. Dx: E11.65. Oley Bascom RAMAN, NP  Active   ibuprofen  (ADVIL ) 800 MG tablet [id number]  Take 1 tablet (800 mg total) by mouth every 8 (eight) hours as needed. Oley Bascom RAMAN, NP  Active   insulin  glargine (LANTUS ) 100 UNIT/ML Solostar Pen [id number]  Inject 30 Units into the skin 2 (two) times daily. Oley Bascom RAMAN, NP  Active   Insulin  Pen Needle (PEN NEEDLES 3/16) 31G X 5 MM MISC [id number]  Use to inject insulin  twice daily and liraglutide  (Victoza ) once daily. E11.65. Oley Bascom RAMAN, NP  Active   lisinopril  (ZESTRIL ) 20 MG tablet [id number]  Take 1  tablet (20 mg total) by mouth daily. Oley Bascom RAMAN, NP  Active   metFORMIN  (GLUCOPHAGE -XR) 500 MG 24 hr tablet [id number]  Take 2 tablets (1,000 mg total) by mouth 2 (two) times daily with a meal. Oley Bascom RAMAN, NP  Active   naproxen  (NAPROSYN ) 500 MG tablet [id number]  TAKE 1 TABLET TWICE DAILY WITH MEALS Nichols, Tonya S, NP  Active   Nutritional Supplements (GLUCERNA 1.0 CAL) LIQD [id number]  Take 1 Bottle by mouth 3 (three) times daily as needed. Oley Bascom RAMAN, NP  Active   rosuvastatin  (CRESTOR ) 40 MG tablet [id number]  Take 1 tablet (40 mg total) by mouth daily. Paseda, Folashade R, FNP  Active   Semaglutide , 1 MG/DOSE, 4 MG/3ML SOPN [id number]  Inject 1 mg into the skin once a week. Oley Bascom RAMAN, NP  Active             Recommendation:   Continue Current Plan of Care  Follow Up Plan:   Telephone follow up appointment date/time:  01/17/24 at 2:30  PM  Rosaline Finlay, RN MSN [place]  [facility] RN Care Manager Direct Dial: [phone]  Fax: [phone]

## 2023-12-20 NOTE — Patient Instructions (Signed)
 Visit Information  Thank you for taking time to visit with me today. Please don't hesitate to contact me if I can be of assistance to you before our next scheduled appointment.  Your next care management appointment is by telephone on 01/17/24 at 2:30 PM  Please call the care guide team at [phone] if you need to cancel, schedule, or reschedule an appointment.   Please call the Suicide and Crisis Lifeline: 988 call 1-800-273-TALK (toll free, 24 hour hotline) if you are experiencing a Mental Health or Behavioral Health Crisis or need someone to talk to.  Rosaline Finlay, RN MSN [place]  [facility]BC[facility] Population Health RN Care Manager Direct Dial: [phone]  Fax: [phone]

## 2023-12-26 ENCOUNTER — Other Ambulatory Visit: Payer: Self-pay | Admitting: Nurse Practitioner

## 2023-12-26 ENCOUNTER — Ambulatory Visit: Admitting: Podiatry

## 2023-12-26 DIAGNOSIS — K047 Periapical abscess without sinus: Secondary | ICD-10-CM

## 2023-12-26 NOTE — Progress Notes (Deleted)
 12/26/2023 Name: Evelyn Watts MRN: [id number] DOB: 08/26/1975  No chief complaint on file.   Evelyn Watts is a 48 y.o. year old female who presented for a telephone visit.   They were referred to the pharmacist by their PCP for assistance in managing diabetes, hypertension, and hyperlipidemia.   Subjective: At last pharmacy telephone appt on 10/09/23, patient reported that she started Ozempic  and stopped Victoza . However, she had run out of Ozempic  ~2 weeks ago. She was instructed to increase Ozempic  to 0.5 mg weekly. She was dispensed a 3 mo supply of FL3+ sensors on 10/09/23 from [facility] pharmacy. She was seen by her PCP, Evelyn Borer, NP on 11/09/23. A1C had decreased slightly from 12.8% to 11.6%. Her BP was controlled at 121/74 mmHg.   Today, patient reports doing well. She has been out of her Ozempic  for ~2 weeks. Reports she does not currently have a ride for the pharmacy, so would prefer to receive a 3 mo supply in the mail. She denies missed doses of her other medications. She reports that she was tolerating Ozempic  0.5 mg well without any GI issues (nausea, vomiting, constipation, diarrhea, abdominal pain).   Care Team: Primary Care Provider: Borer Evelyn RAMAN, NP ; Next Scheduled Visit: 02/09/24  Medication Access/Adherence  Current Pharmacy:  [id number] [address], [place] - [address] AT [address] [place] [address] Phone: [phone] Fax: [phone]  [facility] - [address], [place] - [address] [place] [address] Phone: [phone] Fax: [phone]  [facility] - [facility] [address] [place] [address] Phone: [phone] Fax: [phone]   Patient reports affordability concerns with their medications: No  - copays usually $0  Patient reports access/transportation concerns to their pharmacy: Yes  - Still using multiple pharmacies: [facility] (most maintenance meds), [facility] on [address] (Ozempic ), [facility] mail order (FL3+) - Patient reports that she continues to have difficulty obtaining transportation for her appointments - working with LCSW - She never received FL3+ CGM from [facility] pharmacy (mailed out 10/09/23) - thinks someone likely stole the package from her porch  Patient reports adherence concerns with their medications:  - Yes - she has an in-home aid that comes to help her with medications and other ADLs every day except Sundays.  - She reports that she ran out of Ozempic  about 2 weeks ago. She confirms she was taking 0.5 mg prior to running out.   Diabetes:  Current medications: Lantus  30 units twice daily, Ozempic  0.5 mg weekly (Mondays), metformin  XR 500 mg - 2 tablets in the morning and 1 tablet at night Medications tried in the past: dulaglutide (upset stomach, nausea), Victoza  (inadequate response)  She denies GI AE since switching from Victoza  to Ozempic . Denies nausea, vomiting, diarrhea, abdominal pain.   Using glucometer; testing in the morning, after meals, and at bedtime  This AM (before eating) - 120 mg/dL. Does not recall any additional numbers. Unable to access her glucometer for more detailed review.   Patient did not receive FL3+ via mail order. Would have to pursue insurance override for refill before Sept 2025.   Patient denies hypoglycemic s/sx including dizziness, shakiness, sweating. Patient denies hyperglycemic symptoms including polyuria, polydipsia, polyphagia, nocturia, neuropathy, blurred vision.  Current meal patterns: 3 meals/day - reports she is mainly baking and boiling her foods. Eating more vegetables - cabbage, stir fry.  - Breakfast: sausage, eggs, grits, oatmeal - Lunch:  hamburger with no bread, just lettuce, fruit - Supper: chicken, small portion of rice, greens - Snacks: handful of chips occasionally - Drinks: glass of water  with every meal  Current physical activity: walking in  the mornings almost every day for 25-30 minutes with aid. Limited by pain in feet with bone spurs. She asks about whether DME orders were placed for rollator with chair.  Hypertension:  Current medications: lisinopril  20 mg daily (current supply per fill hx)  Hyperlipidemia/ASCVD Risk Reduction  Current lipid lowering medications: rosuvastatin  20 mg daily (current supply per fill hx)  The 10-year ASCVD risk score (Arnett DK, et al., 2019) is: 3.7%   Values used to calculate the score:     Age: 52 years     Clincally relevant sex: Female     Is Non-Hispanic African American: No     Diabetic: Yes     Tobacco smoker: No     Systolic Blood Pressure: 121 mmHg     Is BP treated: Yes     HDL Cholesterol: 30 mg/dL     Total Cholesterol: 163 mg/dL   Objective:  BP Readings from Last 3 Encounters:  11/09/23 121/74  09/12/23 132/82  06/08/23 (!) 135/91    Lab Results  Component Value Date   HGBA1C 11.6 (A) 11/09/2023   HGBA1C 12.8 (A) 06/08/2023   HGBA1C 12.6 (A) 03/20/2023    Lab Results  Component Value Date   CREATININE 0.59 09/12/2023   BUN 14 09/12/2023   NA 133 (L) 09/12/2023   K 4.7 09/12/2023   CL 97 09/12/2023   CO2 22 09/12/2023    Lab Results  Component Value Date   CHOL 163 09/12/2023   HDL 30 (L) 09/12/2023   LDLCALC 73 09/12/2023   LDLDIRECT 79 09/12/2023   TRIG 380 (H) 09/12/2023   CHOLHDL 5.4 (H) 09/12/2023    Medications Reviewed Today   Medications were not reviewed in this encounter       Assessment/Plan:   Diabetes: - Currently uncontrolled with last A1c 11.6% above goal < 7%, but improved from 12.8% on 06/08/23. Patient would benefit from CGM monitoring, but unfortunately her shipment for the FL3+ sensors was lost. She is tolerating Ozempic  0.5 mg weekly well, so will plan to increase to 1 mg weekly at next fill. Will provide with 3 mo supply via mail order to promote adherence.  - Reviewed long term cardiovascular and renal outcomes of  uncontrolled blood sugar - Reviewed goal A1c, goal fasting, and goal 2 hour post prandial glucose - Reviewed dietary modifications including drinking more water , focusing on intake of protein and nonstarchy vegetables - Reviewed lifestyle modifications including: increasing physical activity. Commended for daily walking.  - Recommend to increase Ozempic  to 1 mg once weekly. Will collaborate with PCP to place orders. - Recommend to continue Lantus  30 units BID. Can consider switching to concentrated insulin  in the future to reduce injection burden.  - Recommend to continue metformin  XR 1000 mg in the AM and 500 mg in the PM - Recommend to check glucose twice daily with glucometer. Will re-attempt to access CGM at follow-up.  - A1C due Oct 2025   Hypertension: - Currently controlled with last office BP 121/74 mmHg controlled belwo goal <130/80. Patient has adequate supply of ACEi.  - Continue lisinopril  20 mg daily   Hyperlipidemia/ASCVD Risk Reduction: - Currently uncontrolled with last LDL 73 mg/dL, above goal <29 mg/dL but improved from 889 mg/dL. TG elevated to 380 mg/dL, above goal <  150 mg/dL, but improved from 543 mg/dL. Adherence appears appropriate. Patient may benefit from addition of ezetimibe 10 mg daily, but hesitant to increase pill burden given existing issues with adherence and confusion at the pharmacy.  - Continue rosuvastatin  20 mg daily   Follow Up Plan:  Pharmacist telephone 12/27/23, PCP 02/09/24  Lorain Baseman, PharmD PGY1 Pharmacy Resident

## 2023-12-27 ENCOUNTER — Telehealth: Payer: Self-pay

## 2023-12-27 ENCOUNTER — Other Ambulatory Visit: Payer: Self-pay

## 2023-12-27 NOTE — Telephone Encounter (Signed)
 Attempted to contact patient for scheduled appointment for medication management. Left HIPAA compliant message for patient to return my call at their convenience.   Will engage CPhT for continued outreach to assess medication adherence and access.   Lorain Baseman, PharmD [facility] [phone]

## 2023-12-29 ENCOUNTER — Other Ambulatory Visit: Payer: Self-pay | Admitting: Nurse Practitioner

## 2024-01-01 ENCOUNTER — Other Ambulatory Visit (HOSPITAL_COMMUNITY): Payer: Self-pay

## 2024-01-01 ENCOUNTER — Telehealth: Payer: Self-pay | Admitting: Pharmacy Technician

## 2024-01-01 NOTE — Progress Notes (Signed)
 01/01/2024 Name: Evelyn Watts MRN: [id number] DOB: [birth year]  Patient is appearing on a report for [employer] [facility] and last engaged with the clinical pharmacist to discuss diabetes on 11/20/2023. Contacted patient today to discuss diabetes management and completed medication review.   Diabetes Plan from last clinical pharmacist appointment:  Diabetes: - Currently uncontrolled with last A1c 11.6% above goal < 7%, but improved from 12.8% on 06/08/23. Patient would benefit from CGM monitoring, but unfortunately her shipment for the FL3+ sensors was lost. She is tolerating Ozempic  0.5 mg weekly well, so will plan to increase to 1 mg weekly at next fill. Will provide with 3 mo supply via mail order to promote adherence.  - Reviewed long term cardiovascular and renal outcomes of uncontrolled blood sugar - Reviewed goal A1c, goal fasting, and goal 2 hour post prandial glucose - Reviewed dietary modifications including drinking more water , focusing on intake of protein and nonstarchy vegetables - Reviewed lifestyle modifications including: increasing physical activity. Commended for daily walking.  - Recommend to increase Ozempic  to 1 mg once weekly. Will collaborate with PCP to place orders. - Recommend to continue Lantus  30 units BID. Can consider switching to concentrated insulin  in the future to reduce injection burden.  - Recommend to continue metformin  XR 1000 mg in the AM and 500 mg in the PM - Recommend to check glucose twice daily with glucometer. Will re-attempt to access CGM at follow-up.  - A1C due Oct 2025  Hypertension: - Currently controlled with last office BP 121/74 mmHg controlled belwo goal <130/80. Patient has adequate supply of ACEi.  - Continue lisinopril  20 mg daily  Hyperlipidemia/ASCVD Risk Reduction: - Currently uncontrolled with last LDL 73 mg/dL, above goal <29 mg/dL but improved from 889 mg/dL. TG elevated to 380 mg/dL, above goal < 849 mg/dL, but improved from  543 mg/dL. Adherence appears appropriate. Patient may benefit from addition of ezetimibe 10 mg daily, but hesitant to increase pill burden given existing issues with adherence and confusion at the pharmacy.  - Continue rosuvastatin  20 mg daily  Follow Up Plan:  Pharmacist telephone 12/27/23, PCP 10/17/2(copy/paste from last note)   Medication Adherence Barriers Identified:  Patient made recommended medication changes per plan: Yes Patient informs she started the Ozempic  1mg  weekly dose. She informs she has some nausea but it eventually wears off. Access issues with any new medication or testing device: Yes Patient informs she needs refills on ALL her medications sent into [facility] for 90 day supplies. Advised patient that most of the medications were sent in last month. Informed patient test strips and lancets were sent into to [facility] today.Inquired about Ozempic  as a 3 month supply was just delivered in July to patient. Patient informs she will check on that one and let us  know if it is still needed.  Patient was adamant that ALL other medications  be called in again as she informs she spoke to a [facility] representative today and they informed her new refills are needed. Sent information to PharmD to complete if applicable. Advised patient to call [facility] to re order FreeStyle sensors as they do have refills on that prescription. Patient was provided phone number to call and order sensors.   Patient is checking blood sugars as prescribed: Yes Patient states that she is checking blood sugars but needs more supplies as outlined above. She informs blood sugars are averaging 175-199 with highest blood sugar at 220. Per Dr Annemarie, patient seems pretty adherent to  taking maintenance medications. Patient inquired about podiatry appointment. Informed patient it appears she missed that appointment on 12/26/23. Advised patient to call and reschedule with that  clinic.  Medication Adherence Barriers Addressed/Actions Taken:  Reviewed medication changes per plan from last clinical pharmacist note Medication Access for Patient states needs refills sent into [facility] on ALL medicaitons. Will discuss medication access concerns with pharmacist Educated patient to contact pharmacy regarding new prescriptions/refills Provided patient phone number to call [facility] to order [employer] sensors  Reviewed instructions for monitoring blood sugars at home and reminded patient to keep a written log to review with pharmacist Reminded patient of date/time of upcoming clinical pharmacist follow up and any upcoming PCP/specialists visits. Patient denies transportation barriers to the appointment. No PharmD is working with SW regarding transportation needs. Medications are being sent to mail order pharmacy.  Next clinical pharmacist appointment is scheduled for: 02/13/2024   Joanmarie Tsang, CPhT [facility] Pharmacy Office: [phone] Email: [email or web address]

## 2024-01-02 ENCOUNTER — Other Ambulatory Visit: Payer: Self-pay | Admitting: Nurse Practitioner

## 2024-01-02 ENCOUNTER — Other Ambulatory Visit: Payer: Self-pay

## 2024-01-02 DIAGNOSIS — I10 Essential (primary) hypertension: Secondary | ICD-10-CM

## 2024-01-02 DIAGNOSIS — E1169 Type 2 diabetes mellitus with other specified complication: Secondary | ICD-10-CM

## 2024-01-02 DIAGNOSIS — K047 Periapical abscess without sinus: Secondary | ICD-10-CM

## 2024-01-02 DIAGNOSIS — J302 Other seasonal allergic rhinitis: Secondary | ICD-10-CM

## 2024-01-02 DIAGNOSIS — E1165 Type 2 diabetes mellitus with hyperglycemia: Secondary | ICD-10-CM

## 2024-01-02 MED ORDER — LISINOPRIL 20 MG PO TABS: 20.0000 mg | ORAL_TABLET | Freq: Every day | ORAL | 3 refills | Status: AC

## 2024-01-02 MED ORDER — ROSUVASTATIN CALCIUM 40 MG PO TABS: 40.0000 mg | ORAL_TABLET | Freq: Every day | ORAL | 3 refills | Status: AC

## 2024-01-02 MED ORDER — METFORMIN HCL ER 500 MG PO TB24: 1000.0000 mg | ORAL_TABLET | Freq: Two times a day (BID) | ORAL | 3 refills | Status: DC

## 2024-01-02 MED ORDER — ALBUTEROL SULFATE HFA 108 (90 BASE) MCG/ACT IN AERS: 2.0000 | INHALATION_SPRAY | RESPIRATORY_TRACT | 3 refills | Status: AC | PRN

## 2024-01-02 MED ORDER — INSULIN GLARGINE 100 UNIT/ML SOLOSTAR PEN: 30.0000 [IU] | PEN_INJECTOR | Freq: Two times a day (BID) | SUBCUTANEOUS | 3 refills | Status: DC

## 2024-01-02 MED ORDER — FERROUS SULFATE 90 (18 FE) MG PO TABS: 1.0000 | ORAL_TABLET | Freq: Every day | ORAL | 3 refills | Status: AC

## 2024-01-02 MED ORDER — PEN NEEDLES 32G X 4 MM MISC: 3 refills | Status: DC

## 2024-01-02 MED ORDER — CETIRIZINE HCL 10 MG PO TABS: 10.0000 mg | ORAL_TABLET | Freq: Every day | ORAL | 3 refills | Status: AC

## 2024-01-02 NOTE — Progress Notes (Signed)
 Patient requesting refills of maintenance medications be sent to [facility] pharmacy, as she was notified by representative that several orders will be expiring soon. Will collaborate with PCP to send refills of appropriate maintenance medications.   Lorain Baseman, PharmD [facility] [phone]

## 2024-01-17 ENCOUNTER — Other Ambulatory Visit: Payer: Self-pay

## 2024-01-17 NOTE — Patient Outreach (Signed)
 Complex Care Management   Visit Note  01/17/2024  Name:  Evelyn Watts MRN: [id number] DOB: October 03, 1975  Situation: Referral received for Complex Care Management related to Diabetes with Complications and HTN I obtained verbal consent from Patient.  Visit completed with Patient  on the phone  Background:   Past Medical History:  Diagnosis Date   Allergies    Asthma    Diabetes mellitus without complication (HCC)    Essential hypertension     Assessment: Patient Reported Symptoms:  Cognitive Cognitive Status: Able to follow simple commands, Alert and oriented to person, place, and time, Normal speech and language skills Cognitive/Intellectual Conditions Management [RPT]: None reported or documented in medical history or problem list      Neurological      HEENT HEENT Symptoms Reported: No symptoms reported HEENT Comment: Patient reports she has not obtained list from [facility] with eye doctor or dentist. She states she is going to call them tomorrow. Vision problem(s), Tooth problem(s)  Cardiovascular Cardiovascular Symptoms Reported: No symptoms reported Does patient have uncontrolled Hypertension?: No Is patient checking Blood Pressure at home?: No Cardiovascular Management Strategies: Medication therapy Cardiovascular Comment: Patient reports she has talked to [facility] and she does have an OTC benefit. She reports they sent her a booklet the other day regarding new changes for next year. Patient reports [employer] requires her to get BP monitor from [facility] pharmacy and that a pharmacist will be reaching out to her  Respiratory Respiratory Symptoms Reported: No symptoms reported    Endocrine Endocrine Symptoms Reported: No symptoms reported Is patient diabetic?: Yes Is patient checking blood sugars at home?: Yes List most recent blood sugar readings, include date and time of day: Fingersticks before meals and at bedtime. 150-160 per patient after eating    Gastrointestinal  Gastrointestinal Symptoms Reported: No symptoms reported      Genitourinary Genitourinary Symptoms Reported: No symptoms reported    Integumentary Integumentary Symptoms Reported: Not assessed    Musculoskeletal Musculoskelatal Symptoms Reviewed: Joint pain Additional Musculoskeletal Details: Patient reports pain in her R shoulder into elbow. She has been taking ibuprofen  and naproxen , which only provide temporary relief. She did take Ibuprofen  today Musculoskeletal Management Strategies: Medical device, Adequate rest, Exercise (Rollator)      Psychosocial Psychosocial Symptoms Reported: Not assessed          01/17/2024    PHQ2-9 Depression Screening   Little interest or pleasure in doing things    Feeling down, depressed, or hopeless    PHQ-2 - Total Score    Trouble falling or staying asleep, or sleeping too much    Feeling tired or having little energy    Poor appetite or overeating     Feeling bad about yourself - or that you are a failure or have let yourself or your family down    Trouble concentrating on things, such as reading the newspaper or watching television    Moving or speaking so slowly that other people could have noticed.  Or the opposite - being so fidgety or restless that you have been moving around a lot more than usual    Thoughts that you would be better off dead, or hurting yourself in some way    PHQ2-9 Total Score    If you checked off any problems, how difficult have these problems made it for you to do your work, take care of things at home, or get along with other people    Depression Interventions/Treatment  There were no vitals filed for this visit.  Medications Reviewed Today     Reviewed by Arno Rosaline SQUIBB, RN (Registered Nurse) on 01/17/24 at 1434  Med List Status: <None>   Medication Order Taking? Sig Documenting Provider Last Dose Status Informant  Accu-Chek Softclix Lancets lancets [id number]  TEST BLOOD SUGAR AS DIRECTED Oley Bascom RAMAN, NP  Active   albuterol  (VENTOLIN  HFA) 108 (90 Base) MCG/ACT inhaler [id number]  Inhale 2 puffs into the lungs every 4 (four) hours as needed for wheezing. Oley Bascom RAMAN, NP  Active   cetirizine  (ZYRTEC ) 10 MG tablet [id number]  Take 1 tablet (10 mg total) by mouth daily. Oley Bascom RAMAN, NP  Active   Continuous Glucose Sensor (FREESTYLE LIBRE 3 PLUS SENSOR) [place] [id number]  Change sensor every 15 days. Use to check glucose continuously using the [employer] 3 app. Oley Bascom RAMAN, NP  Active   cyclobenzaprine  (FLEXERIL ) 5 MG tablet [id number]  Take 1 tablet (5 mg total) by mouth 3 (three) times daily as needed for muscle spasms. Paseda, Folashade R, FNP  Active   Ferrous Sulfate  90 (18 Fe) MG TABS [id number]  Take 1 tablet by mouth daily. Oley Bascom RAMAN, NP  Active   gabapentin  (NEURONTIN ) 300 MG capsule [id number]  TAKE 1 CAPSULE THREE TIMES DAILY Nichols, Tonya S, NP  Active   glucose blood (ACCU-CHEK GUIDE TEST) test strip [id number]  TEST BLOOD SUGAR AS DIRECTED Oley Bascom RAMAN, NP  Active   IBU 600 MG tablet [id number]  TAKE 1 TABLET EVERY 8 HOURS AS NEEDED Nichols, Tonya S, NP  Active   ibuprofen  (ADVIL ) 800 MG tablet [id number]  Take 1 tablet (800 mg total) by mouth every 8 (eight) hours as needed. Oley Bascom RAMAN, NP  Active   insulin  glargine (LANTUS ) 100 UNIT/ML Solostar Pen [id number]  Inject 30 Units into the skin 2 (two) times daily. Oley Bascom RAMAN, NP  Active   Insulin  Pen Needle (PEN NEEDLES) 32G X 4 MM MISC [id number]  Use as directed to inject insulin  twice daily. Oley Bascom RAMAN, NP  Active   lisinopril  (ZESTRIL ) 20 MG tablet [id number]  Take 1 tablet (20 mg total) by mouth daily. Oley Bascom RAMAN, NP  Active   metFORMIN  (GLUCOPHAGE -XR) 500 MG 24 hr tablet [id number]  Take 2 tablets (1,000 mg total) by mouth 2 (two) times daily with a meal. Oley Bascom RAMAN, NP  Active   naproxen  (NAPROSYN ) 500 MG tablet [id number]  TAKE 1 TABLET TWICE DAILY WITH MEALS Nichols, Tonya S, NP   Active   Nutritional Supplements (GLUCERNA 1.0 CAL) LIQD [id number]  Take 1 Bottle by mouth 3 (three) times daily as needed. Oley Bascom RAMAN, NP  Active   rosuvastatin  (CRESTOR ) 40 MG tablet [id number]  Take 1 tablet (40 mg total) by mouth daily. Oley Bascom RAMAN, NP  Active   Semaglutide , 1 MG/DOSE, 4 MG/3ML SOPN [id number]  Inject 1 mg into the skin once a week. Oley Bascom RAMAN, NP  Active             Recommendation:   PCP Follow-up Continue Current Plan of Care  Follow Up Plan:   Telephone follow up appointment date/time:  02/14/24 at 2:30 PM  Rosaline Arno, RN MSN [place]  [facility] RN Care Manager Direct Dial: [phone]  Fax: [phone]

## 2024-01-17 NOTE — Patient Instructions (Signed)
 Visit Information  Thank you for taking time to visit with me today. Please don't hesitate to contact me if I can be of assistance to you before our next scheduled appointment.  Your next care management appointment is by telephone on 02/14/24 at 2:30 PM  Please call the care guide team at [phone] if you need to cancel, schedule, or reschedule an appointment.   Please call the Suicide and Crisis Lifeline: 988 call 1-800-273-TALK (toll free, 24 hour hotline) if you are experiencing a Mental Health or Behavioral Health Crisis or need someone to talk to.  Rosaline Finlay, RN MSN [place]  [facility] RN Care Manager Direct Dial: [phone]  Fax: [phone]

## 2024-01-20 ENCOUNTER — Other Ambulatory Visit: Payer: Self-pay | Admitting: Nurse Practitioner

## 2024-01-20 DIAGNOSIS — K047 Periapical abscess without sinus: Secondary | ICD-10-CM

## 2024-01-26 NOTE — Progress Notes (Signed)
 Evelyn Watts                                          MRN: [id number]   01/26/2024   The [facility] Quality Team Specialist reviewed this patient medical record for the purposes of chart review for care gap closure. The following were reviewed: chart review for care gap closure-kidney health evaluation for diabetes:eGFR  and uACR.    [facility] Quality Team

## 2024-02-01 NOTE — Progress Notes (Signed)
 Evelyn Watts                                          MRN: [id number]   02/01/2024   The [facility] Quality Team Specialist reviewed this patient medical record for the purposes of chart review for care gap closure. The following were reviewed: chart review for care gap closure-glycemic status assessment.    [facility] Quality Team

## 2024-02-09 ENCOUNTER — Ambulatory Visit: Payer: Self-pay | Admitting: Nurse Practitioner

## 2024-02-10 ENCOUNTER — Emergency Department (HOSPITAL_COMMUNITY)

## 2024-02-10 ENCOUNTER — Other Ambulatory Visit: Payer: Self-pay

## 2024-02-10 ENCOUNTER — Encounter (HOSPITAL_COMMUNITY): Payer: Self-pay

## 2024-02-10 ENCOUNTER — Inpatient Hospital Stay (HOSPITAL_COMMUNITY)
Admission: EM | Admit: 2024-02-10 | Discharge: 2024-02-19 | DRG: 854 | Disposition: A | Discharge status: Home Health | Attending: Internal Medicine | Admitting: Internal Medicine

## 2024-02-10 DIAGNOSIS — E11628 Type 2 diabetes mellitus with other skin complications: Secondary | ICD-10-CM | POA: Diagnosis present

## 2024-02-10 DIAGNOSIS — A419 Sepsis, unspecified organism: Principal | ICD-10-CM | POA: Diagnosis present

## 2024-02-10 DIAGNOSIS — L02415 Cutaneous abscess of right lower limb: Secondary | ICD-10-CM | POA: Diagnosis present

## 2024-02-10 DIAGNOSIS — B952 Enterococcus as the cause of diseases classified elsewhere: Secondary | ICD-10-CM | POA: Diagnosis present

## 2024-02-10 DIAGNOSIS — Z6838 Body mass index (BMI) 38.0-38.9, adult: Secondary | ICD-10-CM | POA: Diagnosis not present

## 2024-02-10 DIAGNOSIS — E1165 Type 2 diabetes mellitus with hyperglycemia: Secondary | ICD-10-CM | POA: Diagnosis present

## 2024-02-10 DIAGNOSIS — Z79899 Other long term (current) drug therapy: Secondary | ICD-10-CM

## 2024-02-10 DIAGNOSIS — E119 Type 2 diabetes mellitus without complications: Secondary | ICD-10-CM

## 2024-02-10 DIAGNOSIS — L0390 Cellulitis, unspecified: Secondary | ICD-10-CM | POA: Diagnosis not present

## 2024-02-10 DIAGNOSIS — Z7984 Long term (current) use of oral hypoglycemic drugs: Secondary | ICD-10-CM

## 2024-02-10 DIAGNOSIS — Z9049 Acquired absence of other specified parts of digestive tract: Secondary | ICD-10-CM

## 2024-02-10 DIAGNOSIS — E66812 Obesity, class 2: Secondary | ICD-10-CM | POA: Diagnosis present

## 2024-02-10 DIAGNOSIS — Z794 Long term (current) use of insulin: Secondary | ICD-10-CM | POA: Diagnosis not present

## 2024-02-10 DIAGNOSIS — M7989 Other specified soft tissue disorders: Secondary | ICD-10-CM | POA: Diagnosis not present

## 2024-02-10 DIAGNOSIS — D62 Acute posthemorrhagic anemia: Secondary | ICD-10-CM | POA: Diagnosis not present

## 2024-02-10 DIAGNOSIS — E1142 Type 2 diabetes mellitus with diabetic polyneuropathy: Secondary | ICD-10-CM | POA: Diagnosis present

## 2024-02-10 DIAGNOSIS — L02413 Cutaneous abscess of right upper limb: Secondary | ICD-10-CM | POA: Diagnosis not present

## 2024-02-10 DIAGNOSIS — I517 Cardiomegaly: Secondary | ICD-10-CM | POA: Diagnosis not present

## 2024-02-10 DIAGNOSIS — L089 Local infection of the skin and subcutaneous tissue, unspecified: Secondary | ICD-10-CM | POA: Diagnosis present

## 2024-02-10 DIAGNOSIS — Z7985 Long-term (current) use of injectable non-insulin antidiabetic drugs: Secondary | ICD-10-CM | POA: Diagnosis not present

## 2024-02-10 DIAGNOSIS — D649 Anemia, unspecified: Secondary | ICD-10-CM | POA: Diagnosis not present

## 2024-02-10 DIAGNOSIS — Z91040 Latex allergy status: Secondary | ICD-10-CM | POA: Diagnosis not present

## 2024-02-10 DIAGNOSIS — Z6835 Body mass index (BMI) 35.0-35.9, adult: Secondary | ICD-10-CM

## 2024-02-10 DIAGNOSIS — E871 Hypo-osmolality and hyponatremia: Secondary | ICD-10-CM | POA: Diagnosis present

## 2024-02-10 DIAGNOSIS — R059 Cough, unspecified: Secondary | ICD-10-CM | POA: Diagnosis not present

## 2024-02-10 DIAGNOSIS — I10 Essential (primary) hypertension: Secondary | ICD-10-CM | POA: Diagnosis present

## 2024-02-10 DIAGNOSIS — L03115 Cellulitis of right lower limb: Secondary | ICD-10-CM | POA: Diagnosis present

## 2024-02-10 DIAGNOSIS — E785 Hyperlipidemia, unspecified: Secondary | ICD-10-CM | POA: Diagnosis present

## 2024-02-10 DIAGNOSIS — L02411 Cutaneous abscess of right axilla: Secondary | ICD-10-CM | POA: Diagnosis not present

## 2024-02-10 DIAGNOSIS — Z8679 Personal history of other diseases of the circulatory system: Secondary | ICD-10-CM

## 2024-02-10 DIAGNOSIS — D638 Anemia in other chronic diseases classified elsewhere: Secondary | ICD-10-CM | POA: Diagnosis present

## 2024-02-10 DIAGNOSIS — M1711 Unilateral primary osteoarthritis, right knee: Secondary | ICD-10-CM | POA: Diagnosis not present

## 2024-02-10 DIAGNOSIS — Z91048 Other nonmedicinal substance allergy status: Secondary | ICD-10-CM

## 2024-02-10 DIAGNOSIS — Z885 Allergy status to narcotic agent status: Secondary | ICD-10-CM

## 2024-02-10 DIAGNOSIS — F329 Major depressive disorder, single episode, unspecified: Secondary | ICD-10-CM | POA: Diagnosis present

## 2024-02-10 DIAGNOSIS — Z23 Encounter for immunization: Secondary | ICD-10-CM | POA: Diagnosis not present

## 2024-02-10 DIAGNOSIS — D509 Iron deficiency anemia, unspecified: Secondary | ICD-10-CM | POA: Diagnosis present

## 2024-02-10 DIAGNOSIS — Z6836 Body mass index (BMI) 36.0-36.9, adult: Secondary | ICD-10-CM | POA: Diagnosis not present

## 2024-02-10 LAB — CBC WITH DIFFERENTIAL/PLATELET
Abs Immature Granulocytes: 0.15 K/uL — ABNORMAL HIGH (ref 0.00–0.07)
Basophils Absolute: 0.1 K/uL (ref 0.0–0.1)
Basophils Relative: 0 %
Eosinophils Absolute: 0.1 K/uL (ref 0.0–0.5)
Eosinophils Relative: 0 %
HCT: 26.7 % — ABNORMAL LOW (ref 36.0–46.0)
Hemoglobin: 8.5 g/dL — ABNORMAL LOW (ref 12.0–15.0)
Immature Granulocytes: 1 %
Lymphocytes Relative: 5 %
Lymphs Abs: 1 K/uL (ref 0.7–4.0)
MCH: 23.5 pg — ABNORMAL LOW (ref 26.0–34.0)
MCHC: 31.8 g/dL (ref 30.0–36.0)
MCV: 74 fL — ABNORMAL LOW (ref 80.0–100.0)
Monocytes Absolute: 1.3 K/uL — ABNORMAL HIGH (ref 0.1–1.0)
Monocytes Relative: 7 %
Neutro Abs: 16.2 K/uL — ABNORMAL HIGH (ref 1.7–7.7)
Neutrophils Relative %: 87 %
Platelets: 461 K/uL — ABNORMAL HIGH (ref 150–400)
RBC: 3.61 MIL/uL — ABNORMAL LOW (ref 3.87–5.11)
RDW: 16.2 % — ABNORMAL HIGH (ref 11.5–15.5)
WBC: 18.7 K/uL — ABNORMAL HIGH (ref 4.0–10.5)
nRBC: 0 % (ref 0.0–0.2)

## 2024-02-10 LAB — BASIC METABOLIC PANEL WITH GFR
Anion gap: 13 (ref 5–15)
BUN: 22 mg/dL — ABNORMAL HIGH (ref 6–20)
CO2: 21 mmol/L — ABNORMAL LOW (ref 22–32)
Calcium: 8.9 mg/dL (ref 8.9–10.3)
Chloride: 93 mmol/L — ABNORMAL LOW (ref 98–111)
Creatinine, Ser: 0.9 mg/dL (ref 0.44–1.00)
GFR, Estimated: >60 mL/min (ref >60)
Glucose, Bld: 306 mg/dL — ABNORMAL HIGH (ref 70–99)
Potassium: 3.7 mmol/L (ref 3.5–5.1)
Sodium: 127 mmol/L — ABNORMAL LOW (ref 135–145)

## 2024-02-10 LAB — I-STAT CG4 LACTIC ACID, ED: Lactic Acid, Venous: 1.3 mmol/L (ref 0.5–1.9)

## 2024-02-10 MED ORDER — LACTATED RINGERS IV SOLN: INTRAVENOUS | Status: AC

## 2024-02-10 MED ORDER — VANCOMYCIN HCL IN DEXTROSE 1-5 GM/200ML-% IV SOLN
1000.0000 mg | Freq: Once | INTRAVENOUS | Status: AC
  Administered 2024-02-11: 1000 mg via INTRAVENOUS
  Filled 2024-02-10: qty 200

## 2024-02-10 MED ORDER — SODIUM CHLORIDE 0.9 % IV SOLN
2.0000 g | Freq: Once | INTRAVENOUS | Status: AC
  Administered 2024-02-10: 2 g via INTRAVENOUS
  Filled 2024-02-10: qty 20

## 2024-02-10 MED ORDER — IOHEXOL 300 MG/ML  SOLN
100.0000 mL | Freq: Once | INTRAMUSCULAR | Status: AC | PRN
  Administered 2024-02-10: 100 mL via INTRAVENOUS

## 2024-02-10 MED ORDER — LACTATED RINGERS IV BOLUS (SEPSIS)
1000.0000 mL | Freq: Once | INTRAVENOUS | Status: AC
  Administered 2024-02-10: 1000 mL via INTRAVENOUS

## 2024-02-10 NOTE — ED Notes (Signed)
IV attempt unsuccessful x 2

## 2024-02-10 NOTE — ED Provider Notes (Signed)
 [place] EMERGENCY DEPARTMENT AT [facility] Provider Note   CSN: [id number] Arrival date & time: 02/10/24  2056     Patient presents with: Abscess   Evelyn Watts is a 48 y.o. female.  Patient with history significant for type II DM, uncontrolled with most recent A1c greater than 11, obesity, major depressive disorder, hypertension presents to the emergency department complaining of an abscess to the right inner thigh for the past 3 days with malodorous drainage.  She notes purulence coming from the wound.  She denies known fever at home, chest pain, shortness of breath, abdominal pain, nausea, vomiting.  Upon arrival the patient was noted to be tachycardic at the time of my assessment initial labs had returned showing a white count of greater than 18,000.  With a source of infection present a code sepsis was activated and treatment was initiated.  {Add pertinent medical, surgical, social history, OB history to HPI:32947}  Abscess      Prior to Admission medications   Medication Sig Start Date End Date Taking? Authorizing Provider  Accu-Chek Softclix Lancets lancets TEST BLOOD SUGAR AS DIRECTED 01/01/24   Oley Bascom RAMAN, NP  albuterol  (VENTOLIN  HFA) 108 (90 Base) MCG/ACT inhaler Inhale 2 puffs into the lungs every 4 (four) hours as needed for wheezing. 01/02/24   Oley Bascom RAMAN, NP  cetirizine  (ZYRTEC ) 10 MG tablet Take 1 tablet (10 mg total) by mouth daily. 01/02/24   Oley Bascom RAMAN, NP  Continuous Glucose Sensor (FREESTYLE LIBRE 3 PLUS SENSOR) MISC Change sensor every 15 days. Use to check glucose continuously using the [facility] 3 app. 10/03/23   Oley Bascom RAMAN, NP  cyclobenzaprine  (FLEXERIL ) 5 MG tablet Take 1 tablet (5 mg total) by mouth 3 (three) times daily as needed for muscle spasms. 09/12/23   Paseda, Folashade R, FNP  Ferrous Sulfate  90 (18 Fe) MG TABS Take 1 tablet by mouth daily. 01/02/24   Oley Bascom RAMAN, NP  gabapentin  (NEURONTIN ) 300 MG capsule TAKE  1 CAPSULE THREE TIMES DAILY 06/19/23   Nichols, Tonya S, NP  glucose blood (ACCU-CHEK GUIDE TEST) test strip TEST BLOOD SUGAR AS DIRECTED 01/01/24   Oley Bascom RAMAN, NP  IBU 600 MG tablet TAKE 1 TABLET EVERY 8 HOURS AS NEEDED 01/22/24   Nichols, Tonya S, NP  ibuprofen  (ADVIL ) 800 MG tablet Take 1 tablet (800 mg total) by mouth every 8 (eight) hours as needed. 11/09/23   Oley Bascom RAMAN, NP  insulin  glargine (LANTUS ) 100 UNIT/ML Solostar Pen Inject 30 Units into the skin 2 (two) times daily. 01/02/24   Oley Bascom RAMAN, NP  Insulin  Pen Needle (PEN NEEDLES) 32G X 4 MM MISC Use as directed to inject insulin  twice daily. 01/02/24   Oley Bascom RAMAN, NP  lisinopril  (ZESTRIL ) 20 MG tablet Take 1 tablet (20 mg total) by mouth daily. 01/02/24   Oley Bascom RAMAN, NP  metFORMIN  (GLUCOPHAGE -XR) 500 MG 24 hr tablet Take 2 tablets (1,000 mg total) by mouth 2 (two) times daily with a meal. 01/02/24   Nichols, Tonya S, NP  naproxen  (NAPROSYN ) 500 MG tablet TAKE 1 TABLET TWICE DAILY WITH MEALS 09/28/23   Nichols, Tonya S, NP  Nutritional Supplements (GLUCERNA 1.0 CAL) LIQD Take 1 Bottle by mouth 3 (three) times daily as needed. 11/09/23   Oley Bascom RAMAN, NP  rosuvastatin  (CRESTOR ) 40 MG tablet Take 1 tablet (40 mg total) by mouth daily. 01/02/24   Oley Bascom RAMAN, NP  Semaglutide , 1 MG/DOSE, 4 MG/3ML SOPN  Inject 1 mg into the skin once a week. 11/20/23   Oley Bascom RAMAN, NP    Allergies: Latex, Morphine and codeine , and Tape    Review of Systems  Updated Vital Signs BP (!) 159/100 (BP Location: Right Arm)   Pulse (!) 108   Temp 99.9 F (37.7 C) (Oral)   Resp 20   SpO2 99%   Physical Exam Vitals and nursing note reviewed.  Constitutional:      General: She is not in acute distress.    Appearance: She is well-developed.  HENT:     Head: Normocephalic and atraumatic.  Eyes:     Conjunctiva/sclera: Conjunctivae normal.  Cardiovascular:     Rate and Rhythm: Regular rhythm. Tachycardia present.  Pulmonary:      Effort: Pulmonary effort is normal. No respiratory distress.     Breath sounds: Normal breath sounds.  Abdominal:     Palpations: Abdomen is soft.     Tenderness: There is no abdominal tenderness.  Musculoskeletal:        General: No swelling.     Cervical back: Neck supple.  Skin:    General: Skin is warm and dry.     Capillary Refill: Capillary refill takes less than 2 seconds.  Neurological:     Mental Status: She is alert.  Psychiatric:        Mood and Affect: Mood normal.     (all labs ordered are listed, but only abnormal results are displayed) Labs Reviewed  CBC WITH DIFFERENTIAL/PLATELET - Abnormal; Notable for the following components:      Result Value   WBC 18.7 (*)    RBC 3.61 (*)    Hemoglobin 8.5 (*)    HCT 26.7 (*)    MCV 74.0 (*)    MCH 23.5 (*)    RDW 16.2 (*)    Platelets 461 (*)    Neutro Abs 16.2 (*)    Monocytes Absolute 1.3 (*)    Abs Immature Granulocytes 0.15 (*)    All other components within normal limits  BASIC METABOLIC PANEL WITH GFR - Abnormal; Notable for the following components:   Sodium 127 (*)    Chloride 93 (*)    CO2 21 (*)    Glucose, Bld 306 (*)    BUN 22 (*)    All other components within normal limits  CULTURE, BLOOD (ROUTINE X 2)  CULTURE, BLOOD (ROUTINE X 2)  I-STAT CG4 LACTIC ACID, ED    EKG: None  Radiology: No results found.  {Document cardiac monitor, telemetry assessment procedure when appropriate:32947} Procedures   Medications Ordered in the ED  lactated ringers infusion (has no administration in time range)  lactated ringers bolus 1,000 mL (has no administration in time range)  cefTRIAXone  (ROCEPHIN ) 2 g in sodium chloride  0.9 % 100 mL IVPB (has no administration in time range)  vancomycin  (VANCOCIN ) IVPB 1000 mg/200 mL premix (has no administration in time range)      {Click here for ABCD2, HEART and other calculators REFRESH Note before signing:1}                              Medical Decision  Making Amount and/or Complexity of Data Reviewed Labs: ordered. Radiology: ordered.  Risk Prescription drug management.   This patient presents to the ED for concern of wound infection, this involves an extensive number of treatment options, and is a complaint that carries with it a high  risk of complications and morbidity.  The differential diagnosis includes abscess, cellulitis, deep tissue infection, others   Co morbidities / Chronic conditions that complicate the patient evaluation  Type II DM, poorly controlled   Additional history obtained:  Additional history obtained from EMR External records from outside source obtained and reviewed including internal medicine results showing elevated A1c   Lab Tests:  I Ordered, and personally interpreted labs.  The pertinent results include: Leukocytosis with a white count of 18,700, hemoglobin 8.5, BUN 22   Imaging Studies ordered:  I ordered imaging studies including CT of the right femur with contrast I independently visualized and interpreted imaging which showed *** I agree with the radiologist interpretation   Cardiac Monitoring: / EKG:  The patient was maintained on a cardiac monitor.  I personally viewed and interpreted the cardiac monitored which showed an underlying rhythm of: ***   Problem List / ED Course / Critical interventions / Medication management   I ordered medication including LR, Rocephin , vancomycin  Reevaluation of the patient after these medicines showed that the patient *** I have reviewed the patients home medicines and have made adjustments as needed   Consultations Obtained:  I requested consultation with the ***,  and discussed lab and imaging findings as well as pertinent plan - they recommend: ***   Social Determinants of Health:  ***   Test / Admission - Considered:  ***   {Document critical care time when appropriate  Document review of labs and clinical decision tools ie  CHADS2VASC2, etc  Document your independent review of radiology images and any outside records  Document your discussion with family members, caretakers and with consultants  Document social determinants of health affecting pt's care  Document your decision making why or why not admission, treatments were needed:32947:::1}   Final diagnoses:  None    ED Discharge Orders     None

## 2024-02-10 NOTE — ED Triage Notes (Signed)
 Pt presents via POV c/o abscess to left inner thigh x3 days. Reports malodorous and purulent. Reports hx of uncontrolled DM.

## 2024-02-10 NOTE — Sepsis Progress Note (Signed)
 Elink monitoring for the code sepsis protocol.

## 2024-02-11 ENCOUNTER — Inpatient Hospital Stay (HOSPITAL_COMMUNITY)

## 2024-02-11 ENCOUNTER — Inpatient Hospital Stay (HOSPITAL_COMMUNITY): Admitting: Certified Registered"

## 2024-02-11 ENCOUNTER — Encounter (HOSPITAL_COMMUNITY): Payer: Self-pay | Admitting: Internal Medicine

## 2024-02-11 ENCOUNTER — Encounter (HOSPITAL_COMMUNITY)
Admission: EM | Disposition: A | Discharge status: Home Health | Payer: Self-pay | Source: Home / Self Care | Attending: Internal Medicine

## 2024-02-11 DIAGNOSIS — E1165 Type 2 diabetes mellitus with hyperglycemia: Secondary | ICD-10-CM | POA: Diagnosis present

## 2024-02-11 DIAGNOSIS — A419 Sepsis, unspecified organism: Secondary | ICD-10-CM | POA: Diagnosis present

## 2024-02-11 DIAGNOSIS — Z91048 Other nonmedicinal substance allergy status: Secondary | ICD-10-CM | POA: Diagnosis not present

## 2024-02-11 DIAGNOSIS — E871 Hypo-osmolality and hyponatremia: Secondary | ICD-10-CM | POA: Diagnosis present

## 2024-02-11 DIAGNOSIS — E11628 Type 2 diabetes mellitus with other skin complications: Secondary | ICD-10-CM | POA: Diagnosis present

## 2024-02-11 DIAGNOSIS — B952 Enterococcus as the cause of diseases classified elsewhere: Secondary | ICD-10-CM | POA: Diagnosis present

## 2024-02-11 DIAGNOSIS — Z885 Allergy status to narcotic agent status: Secondary | ICD-10-CM | POA: Diagnosis not present

## 2024-02-11 DIAGNOSIS — I10 Essential (primary) hypertension: Secondary | ICD-10-CM | POA: Diagnosis not present

## 2024-02-11 DIAGNOSIS — Z794 Long term (current) use of insulin: Secondary | ICD-10-CM | POA: Diagnosis not present

## 2024-02-11 DIAGNOSIS — D62 Acute posthemorrhagic anemia: Secondary | ICD-10-CM | POA: Diagnosis not present

## 2024-02-11 DIAGNOSIS — I517 Cardiomegaly: Secondary | ICD-10-CM | POA: Diagnosis not present

## 2024-02-11 DIAGNOSIS — L089 Local infection of the skin and subcutaneous tissue, unspecified: Secondary | ICD-10-CM | POA: Diagnosis present

## 2024-02-11 DIAGNOSIS — D638 Anemia in other chronic diseases classified elsewhere: Secondary | ICD-10-CM | POA: Diagnosis present

## 2024-02-11 DIAGNOSIS — E1142 Type 2 diabetes mellitus with diabetic polyneuropathy: Secondary | ICD-10-CM | POA: Diagnosis present

## 2024-02-11 DIAGNOSIS — E66812 Obesity, class 2: Secondary | ICD-10-CM | POA: Diagnosis present

## 2024-02-11 DIAGNOSIS — D649 Anemia, unspecified: Secondary | ICD-10-CM | POA: Diagnosis present

## 2024-02-11 DIAGNOSIS — Z7984 Long term (current) use of oral hypoglycemic drugs: Secondary | ICD-10-CM | POA: Diagnosis not present

## 2024-02-11 DIAGNOSIS — Z6836 Body mass index (BMI) 36.0-36.9, adult: Secondary | ICD-10-CM | POA: Diagnosis not present

## 2024-02-11 DIAGNOSIS — F329 Major depressive disorder, single episode, unspecified: Secondary | ICD-10-CM | POA: Diagnosis present

## 2024-02-11 DIAGNOSIS — R059 Cough, unspecified: Secondary | ICD-10-CM | POA: Diagnosis not present

## 2024-02-11 DIAGNOSIS — Z6838 Body mass index (BMI) 38.0-38.9, adult: Secondary | ICD-10-CM | POA: Diagnosis not present

## 2024-02-11 DIAGNOSIS — Z79899 Other long term (current) drug therapy: Secondary | ICD-10-CM | POA: Diagnosis not present

## 2024-02-11 DIAGNOSIS — Z7985 Long-term (current) use of injectable non-insulin antidiabetic drugs: Secondary | ICD-10-CM | POA: Diagnosis not present

## 2024-02-11 DIAGNOSIS — L02415 Cutaneous abscess of right lower limb: Secondary | ICD-10-CM | POA: Diagnosis not present

## 2024-02-11 DIAGNOSIS — E785 Hyperlipidemia, unspecified: Secondary | ICD-10-CM | POA: Diagnosis present

## 2024-02-11 DIAGNOSIS — L02411 Cutaneous abscess of right axilla: Secondary | ICD-10-CM | POA: Diagnosis not present

## 2024-02-11 DIAGNOSIS — E119 Type 2 diabetes mellitus without complications: Secondary | ICD-10-CM

## 2024-02-11 DIAGNOSIS — L02413 Cutaneous abscess of right upper limb: Secondary | ICD-10-CM

## 2024-02-11 DIAGNOSIS — Z91040 Latex allergy status: Secondary | ICD-10-CM | POA: Diagnosis not present

## 2024-02-11 DIAGNOSIS — D509 Iron deficiency anemia, unspecified: Secondary | ICD-10-CM | POA: Diagnosis present

## 2024-02-11 DIAGNOSIS — L0390 Cellulitis, unspecified: Secondary | ICD-10-CM | POA: Diagnosis not present

## 2024-02-11 DIAGNOSIS — L03115 Cellulitis of right lower limb: Secondary | ICD-10-CM | POA: Insufficient documentation

## 2024-02-11 DIAGNOSIS — Z23 Encounter for immunization: Secondary | ICD-10-CM | POA: Diagnosis not present

## 2024-02-11 DIAGNOSIS — Z8679 Personal history of other diseases of the circulatory system: Secondary | ICD-10-CM

## 2024-02-11 HISTORY — PX: IRRIGATION AND DEBRIDEMENT ABSCESS: SHX5252

## 2024-02-11 LAB — URINALYSIS, COMPLETE (UACMP) WITH MICROSCOPIC
Bilirubin Urine: NEGATIVE
Glucose, UA: 150 mg/dL — AB
Ketones, ur: NEGATIVE mg/dL
Nitrite: NEGATIVE
Protein, ur: 100 mg/dL — AB
Specific Gravity, Urine: 1.035 — ABNORMAL HIGH (ref 1.005–1.030)
pH: 5 (ref 5.0–8.0)

## 2024-02-11 LAB — PROTIME-INR
INR: 1.1 (ref 0.8–1.2)
Prothrombin Time: 15.1 s (ref 11.4–15.2)

## 2024-02-11 LAB — GLUCOSE, CAPILLARY
Glucose-Capillary: 223 mg/dL — ABNORMAL HIGH (ref 70–99)
Glucose-Capillary: 212 mg/dL — ABNORMAL HIGH (ref 70–99)
Glucose-Capillary: 248 mg/dL — ABNORMAL HIGH (ref 70–99)
Glucose-Capillary: 275 mg/dL — ABNORMAL HIGH (ref 70–99)

## 2024-02-11 LAB — COMPREHENSIVE METABOLIC PANEL WITH GFR
ALT: 20 U/L (ref 0–44)
AST: 28 U/L (ref 15–41)
Albumin: 2.8 g/dL — ABNORMAL LOW (ref 3.5–5.0)
Alkaline Phosphatase: 250 U/L — ABNORMAL HIGH (ref 38–126)
Anion gap: 11 (ref 5–15)
BUN: 22 mg/dL — ABNORMAL HIGH (ref 6–20)
CO2: 22 mmol/L (ref 22–32)
Calcium: 8.7 mg/dL — ABNORMAL LOW (ref 8.9–10.3)
Chloride: 98 mmol/L (ref 98–111)
Creatinine, Ser: 0.86 mg/dL (ref 0.44–1.00)
GFR, Estimated: >60 mL/min (ref >60)
Glucose, Bld: 240 mg/dL — ABNORMAL HIGH (ref 70–99)
Potassium: 3.7 mmol/L (ref 3.5–5.1)
Sodium: 131 mmol/L — ABNORMAL LOW (ref 135–145)
Total Bilirubin: 0.3 mg/dL (ref 0.0–1.2)
Total Protein: 6.4 g/dL — ABNORMAL LOW (ref 6.5–8.1)

## 2024-02-11 LAB — CBC WITH DIFFERENTIAL/PLATELET
Abs Immature Granulocytes: 0.17 K/uL — ABNORMAL HIGH (ref 0.00–0.07)
Basophils Absolute: 0.1 K/uL (ref 0.0–0.1)
Basophils Relative: 0 %
Eosinophils Absolute: 0.1 K/uL (ref 0.0–0.5)
Eosinophils Relative: 1 %
HCT: 23.4 % — ABNORMAL LOW (ref 36.0–46.0)
Hemoglobin: 7.4 g/dL — ABNORMAL LOW (ref 12.0–15.0)
Immature Granulocytes: 1 %
Lymphocytes Relative: 8 %
Lymphs Abs: 1.4 K/uL (ref 0.7–4.0)
MCH: 23.3 pg — ABNORMAL LOW (ref 26.0–34.0)
MCHC: 31.6 g/dL (ref 30.0–36.0)
MCV: 73.6 fL — ABNORMAL LOW (ref 80.0–100.0)
Monocytes Absolute: 1.6 K/uL — ABNORMAL HIGH (ref 0.1–1.0)
Monocytes Relative: 9 %
Neutro Abs: 14.4 K/uL — ABNORMAL HIGH (ref 1.7–7.7)
Neutrophils Relative %: 81 %
Platelets: 386 K/uL (ref 150–400)
RBC: 3.18 MIL/uL — ABNORMAL LOW (ref 3.87–5.11)
RDW: 16.4 % — ABNORMAL HIGH (ref 11.5–15.5)
WBC: 17.7 K/uL — ABNORMAL HIGH (ref 4.0–10.5)
nRBC: 0 % (ref 0.0–0.2)

## 2024-02-11 LAB — SURGICAL PCR SCREEN
MRSA, PCR: NEGATIVE
Staphylococcus aureus: POSITIVE — AB

## 2024-02-11 LAB — C-REACTIVE PROTEIN: CRP: 32.9 mg/dL — ABNORMAL HIGH (ref <1.0)

## 2024-02-11 LAB — FERRITIN: Ferritin: 161 ng/mL (ref 11–307)

## 2024-02-11 LAB — ABO/RH: ABO/RH(D): A POS

## 2024-02-11 LAB — IRON AND TIBC
Iron: <10 ug/dL — ABNORMAL LOW (ref 28–170)
TIBC: 225 ug/dL — ABNORMAL LOW (ref 250–450)

## 2024-02-11 LAB — HEMOGLOBIN A1C
Hgb A1c MFr Bld: 12.5 % — ABNORMAL HIGH (ref 4.8–5.6)
Mean Plasma Glucose: 312.05 mg/dL

## 2024-02-11 LAB — TYPE AND SCREEN
ABO/RH(D): A POS
Antibody Screen: NEGATIVE

## 2024-02-11 LAB — MAGNESIUM: Magnesium: 2.1 mg/dL (ref 1.7–2.4)

## 2024-02-11 LAB — CBG MONITORING, ED: Glucose-Capillary: 305 mg/dL — ABNORMAL HIGH (ref 70–99)

## 2024-02-11 LAB — APTT: aPTT: 33 s (ref 24–36)

## 2024-02-11 LAB — HCG, SERUM, QUALITATIVE: Preg, Serum: NEGATIVE

## 2024-02-11 SURGERY — IRRIGATION AND DEBRIDEMENT ABSCESS
Anesthesia: General | Site: Thigh | Laterality: Right

## 2024-02-11 MED ORDER — INSULIN GLARGINE-YFGN 100 UNIT/ML ~~LOC~~ SOLN
12.0000 [IU] | Freq: Two times a day (BID) | SUBCUTANEOUS | Status: DC
  Administered 2024-02-11 – 2024-02-12 (×3): 12 [IU] via SUBCUTANEOUS
  Filled 2024-02-11 (×4): qty 0.12

## 2024-02-11 MED ORDER — ACETAMINOPHEN 10 MG/ML IV SOLN
INTRAVENOUS | Status: DC | PRN
Start: 2024-02-11 — End: 2024-02-11
  Administered 2024-02-11: 1000 mg via INTRAVENOUS

## 2024-02-11 MED ORDER — LACTATED RINGERS IV SOLN: INTRAVENOUS | Status: DC | PRN

## 2024-02-11 MED ORDER — HYDROMORPHONE HCL 1 MG/ML IJ SOLN
1.0000 mg | Freq: Once | INTRAMUSCULAR | Status: AC
  Administered 2024-02-11: 1 mg via INTRAVENOUS
  Filled 2024-02-11 (×2): qty 1

## 2024-02-11 MED ORDER — 0.9 % SODIUM CHLORIDE (POUR BTL) OPTIME
TOPICAL | Status: DC | PRN
  Administered 2024-02-11: 1000 mL

## 2024-02-11 MED ORDER — MUPIROCIN 2 % EX OINT
1.0000 | TOPICAL_OINTMENT | Freq: Two times a day (BID) | CUTANEOUS | Status: DC
  Filled 2024-02-11: qty 22

## 2024-02-11 MED ORDER — INFLUENZA VIRUS VACC SPLIT PF (FLUZONE) 0.5 ML IM SUSY: 0.5000 mL | PREFILLED_SYRINGE | INTRAMUSCULAR | Status: DC

## 2024-02-11 MED ORDER — PHENYLEPHRINE 80 MCG/ML (10ML) SYRINGE FOR IV PUSH (FOR BLOOD PRESSURE SUPPORT)
PREFILLED_SYRINGE | INTRAVENOUS | Status: AC
  Filled 2024-02-11: qty 10

## 2024-02-11 MED ORDER — NALOXONE HCL 0.4 MG/ML IJ SOLN: 0.4000 mg | INTRAMUSCULAR | Status: DC | PRN

## 2024-02-11 MED ORDER — FERROUS SULFATE 325 (65 FE) MG PO TABS
325.0000 mg | ORAL_TABLET | Freq: Three times a day (TID) | ORAL | Status: AC
  Administered 2024-02-11 – 2024-02-18 (×18): 325 mg via ORAL
  Filled 2024-02-11 (×19): qty 1

## 2024-02-11 MED ORDER — FENTANYL CITRATE (PF) 100 MCG/2ML IJ SOLN
INTRAMUSCULAR | Status: DC | PRN
  Administered 2024-02-11 (×2): 25 ug via INTRAVENOUS

## 2024-02-11 MED ORDER — FENTANYL CITRATE (PF) 100 MCG/2ML IJ SOLN
INTRAMUSCULAR | Status: AC
  Filled 2024-02-11: qty 2

## 2024-02-11 MED ORDER — SODIUM CHLORIDE 0.9 % IV SOLN
1.0000 g | INTRAVENOUS | Status: DC
  Administered 2024-02-11: 1 g via INTRAVENOUS
  Filled 2024-02-11 (×2): qty 10

## 2024-02-11 MED ORDER — CHLORHEXIDINE GLUCONATE CLOTH 2 % EX PADS
6.0000 | MEDICATED_PAD | Freq: Every day | CUTANEOUS | Status: AC
  Administered 2024-02-11 – 2024-02-15 (×5): 6 via TOPICAL

## 2024-02-11 MED ORDER — INSULIN ASPART 100 UNIT/ML IJ SOLN
0.0000 [IU] | Freq: Four times a day (QID) | INTRAMUSCULAR | Status: DC
  Administered 2024-02-11: 5 [IU] via SUBCUTANEOUS
  Administered 2024-02-11: 7 [IU] via SUBCUTANEOUS
  Administered 2024-02-11 – 2024-02-12 (×4): 3 [IU] via SUBCUTANEOUS
  Administered 2024-02-12 – 2024-02-13 (×4): 5 [IU] via SUBCUTANEOUS
  Administered 2024-02-13 (×2): 3 [IU] via SUBCUTANEOUS
  Administered 2024-02-14: 2 [IU] via SUBCUTANEOUS
  Administered 2024-02-14 (×2): 3 [IU] via SUBCUTANEOUS
  Administered 2024-02-14 – 2024-02-15 (×3): 2 [IU] via SUBCUTANEOUS
  Administered 2024-02-15: 3 [IU] via SUBCUTANEOUS
  Administered 2024-02-16: 2 [IU] via SUBCUTANEOUS
  Administered 2024-02-16: 1 [IU] via SUBCUTANEOUS
  Administered 2024-02-16: 2 [IU] via SUBCUTANEOUS
  Administered 2024-02-17: 1 [IU] via SUBCUTANEOUS
  Administered 2024-02-17: 2 [IU] via SUBCUTANEOUS
  Administered 2024-02-17: 3 [IU] via SUBCUTANEOUS
  Administered 2024-02-17: 2 [IU] via SUBCUTANEOUS
  Administered 2024-02-18: 3 [IU] via SUBCUTANEOUS
  Administered 2024-02-18: 2 [IU] via SUBCUTANEOUS
  Filled 2024-02-11: qty 0.09

## 2024-02-11 MED ORDER — ONDANSETRON HCL 4 MG/2ML IJ SOLN
INTRAMUSCULAR | Status: AC
  Filled 2024-02-11: qty 2

## 2024-02-11 MED ORDER — ONDANSETRON HCL 4 MG/2ML IJ SOLN: 4.0000 mg | Freq: Four times a day (QID) | INTRAMUSCULAR | Status: DC | PRN

## 2024-02-11 MED ORDER — ACETAMINOPHEN 325 MG PO TABS
650.0000 mg | ORAL_TABLET | Freq: Four times a day (QID) | ORAL | Status: DC | PRN
  Administered 2024-02-11: 650 mg via ORAL
  Filled 2024-02-11: qty 2

## 2024-02-11 MED ORDER — HYDROMORPHONE HCL 1 MG/ML IJ SOLN
0.5000 mg | Freq: Once | INTRAMUSCULAR | Status: AC
  Administered 2024-02-11: 0.5 mg via INTRAVENOUS
  Filled 2024-02-11: qty 0.5

## 2024-02-11 MED ORDER — MIDAZOLAM HCL 2 MG/2ML IJ SOLN
INTRAMUSCULAR | Status: AC
  Filled 2024-02-11: qty 2

## 2024-02-11 MED ORDER — LACTATED RINGERS IV BOLUS
1000.0000 mL | Freq: Once | INTRAVENOUS | Status: AC
  Administered 2024-02-11: 1000 mL via INTRAVENOUS

## 2024-02-11 MED ORDER — AMISULPRIDE (ANTIEMETIC) 5 MG/2ML IV SOLN: 10.0000 mg | Freq: Once | INTRAVENOUS | Status: DC | PRN

## 2024-02-11 MED ORDER — MELATONIN 3 MG PO TABS
3.0000 mg | ORAL_TABLET | Freq: Every evening | ORAL | Status: DC | PRN
  Administered 2024-02-15 – 2024-02-18 (×4): 3 mg via ORAL
  Filled 2024-02-11 (×4): qty 1

## 2024-02-11 MED ORDER — OXYCODONE HCL 5 MG PO TABS: 5.0000 mg | ORAL_TABLET | Freq: Once | ORAL | Status: DC | PRN

## 2024-02-11 MED ORDER — ACETAMINOPHEN 650 MG RE SUPP: 650.0000 mg | Freq: Four times a day (QID) | RECTAL | Status: DC | PRN

## 2024-02-11 MED ORDER — FENTANYL CITRATE (PF) 50 MCG/ML IJ SOSY: 25.0000 ug | PREFILLED_SYRINGE | INTRAMUSCULAR | Status: DC | PRN

## 2024-02-11 MED ORDER — HYDROMORPHONE HCL 1 MG/ML IJ SOLN
0.5000 mg | INTRAMUSCULAR | Status: DC | PRN
  Administered 2024-02-11 – 2024-02-13 (×16): 0.5 mg via INTRAVENOUS
  Filled 2024-02-11 (×16): qty 0.5

## 2024-02-11 MED ORDER — MUPIROCIN 2 % EX OINT
1.0000 | TOPICAL_OINTMENT | Freq: Two times a day (BID) | CUTANEOUS | Status: AC
  Administered 2024-02-11 – 2024-02-16 (×10): 1 via NASAL
  Filled 2024-02-11: qty 22

## 2024-02-11 MED ORDER — LIDOCAINE HCL (PF) 2 % IJ SOLN
INTRAMUSCULAR | Status: DC | PRN
  Administered 2024-02-11: 60 mg via INTRADERMAL

## 2024-02-11 MED ORDER — PROPOFOL 10 MG/ML IV BOLUS
INTRAVENOUS | Status: AC
  Filled 2024-02-11: qty 20

## 2024-02-11 MED ORDER — HYDROMORPHONE HCL 2 MG/ML IJ SOLN
INTRAMUSCULAR | Status: AC
  Filled 2024-02-11: qty 1

## 2024-02-11 MED ORDER — HYDROMORPHONE HCL 1 MG/ML IJ SOLN
INTRAMUSCULAR | Status: DC | PRN
Start: 2024-02-11 — End: 2024-02-11
  Administered 2024-02-11: .5 mg via INTRAVENOUS
  Administered 2024-02-11: 1 mg via INTRAVENOUS

## 2024-02-11 MED ORDER — DEXMEDETOMIDINE HCL IN NACL 80 MCG/20ML IV SOLN
INTRAVENOUS | Status: DC | PRN
  Administered 2024-02-11: 6 ug via INTRAVENOUS

## 2024-02-11 MED ORDER — MIDAZOLAM HCL (PF) 2 MG/2ML IJ SOLN
INTRAMUSCULAR | Status: DC | PRN
  Administered 2024-02-11: 2 mg via INTRAVENOUS

## 2024-02-11 MED ORDER — LIDOCAINE HCL (PF) 2 % IJ SOLN
INTRAMUSCULAR | Status: AC
  Filled 2024-02-11: qty 5

## 2024-02-11 MED ORDER — LINEZOLID 600 MG/300ML IV SOLN
600.0000 mg | Freq: Two times a day (BID) | INTRAVENOUS | Status: DC
Start: 2024-02-11 — End: 2024-02-15
  Administered 2024-02-11 – 2024-02-15 (×9): 600 mg via INTRAVENOUS
  Filled 2024-02-11 (×9): qty 300

## 2024-02-11 MED ORDER — ACETAMINOPHEN 10 MG/ML IV SOLN
INTRAVENOUS | Status: AC
  Filled 2024-02-11: qty 100

## 2024-02-11 MED ORDER — OXYCODONE HCL 5 MG/5ML PO SOLN: 5.0000 mg | Freq: Once | ORAL | Status: DC | PRN

## 2024-02-11 MED ORDER — ONDANSETRON HCL 4 MG/2ML IJ SOLN
INTRAMUSCULAR | Status: DC | PRN
Start: 2024-02-11 — End: 2024-02-11
  Administered 2024-02-11: 4 mg via INTRAVENOUS

## 2024-02-11 MED ORDER — PROPOFOL 10 MG/ML IV BOLUS
INTRAVENOUS | Status: DC | PRN
  Administered 2024-02-11: 40 mg via INTRAVENOUS
  Administered 2024-02-11: 200 mg via INTRAVENOUS

## 2024-02-11 MED ORDER — SODIUM CHLORIDE 0.9 % IV SOLN: 12.5000 mg | INTRAVENOUS | Status: DC | PRN

## 2024-02-11 SURGICAL SUPPLY — 33 items
BAG COUNTER SPONGE SURGICOUNT (BAG) IMPLANT
BLADE HEX COATED 2.75 (ELECTRODE) ×1 IMPLANT
BLADE SURG SZ10 CARB STEEL (BLADE) ×1 IMPLANT
BNDG GAUZE DERMACEA FLUFF 4 (GAUZE/BANDAGES/DRESSINGS) IMPLANT
COVER SURGICAL LIGHT HANDLE (MISCELLANEOUS) ×1 IMPLANT
DERMABOND ADVANCED .7 DNX12 (GAUZE/BANDAGES/DRESSINGS) IMPLANT
DRAPE LAPAROSCOPIC ABDOMINAL (DRAPES) IMPLANT
DRAPE LAPAROTOMY T 102X78X121 (DRAPES) IMPLANT
DRAPE LAPAROTOMY TRNSV 102X78 (DRAPES) IMPLANT
DRAPE SHEET LG 3/4 BI-LAMINATE (DRAPES) IMPLANT
ELECT REM PT RETURN 15FT ADLT (MISCELLANEOUS) ×1 IMPLANT
GAUZE PAD ABD 8X10 STRL (GAUZE/BANDAGES/DRESSINGS) IMPLANT
GAUZE SPONGE 4X4 12PLY STRL (GAUZE/BANDAGES/DRESSINGS) ×1 IMPLANT
GLOVE BIO SURGEON STRL SZ7 (GLOVE) ×3 IMPLANT
GLOVE BIOGEL PI IND STRL 7.0 (GLOVE) ×1 IMPLANT
GLOVE BIOGEL PI IND STRL 7.5 (GLOVE) ×3 IMPLANT
GOWN STRL REUS W/ TWL LRG LVL3 (GOWN DISPOSABLE) ×2 IMPLANT
GOWN STRL REUS W/ TWL XL LVL3 (GOWN DISPOSABLE) ×1 IMPLANT
KIT BASIN OR (CUSTOM PROCEDURE TRAY) ×1 IMPLANT
KIT TURNOVER KIT A (KITS) ×1 IMPLANT
MARKER SKIN DUAL TIP RULER LAB (MISCELLANEOUS) ×1 IMPLANT
NDL HYPO 25X1 1.5 SAFETY (NEEDLE) ×1 IMPLANT
NEEDLE HYPO 25X1 1.5 SAFETY (NEEDLE) ×1 IMPLANT
NS IRRIG 1000ML POUR BTL (IV SOLUTION) ×1 IMPLANT
PACK BASIC VI WITH GOWN DISP (CUSTOM PROCEDURE TRAY) ×1 IMPLANT
PENCIL SMOKE EVACUATOR (MISCELLANEOUS) IMPLANT
SPIKE FLUID TRANSFER (MISCELLANEOUS) IMPLANT
SPONGE T-LAP 4X18 ~~LOC~~+RFID (SPONGE) IMPLANT
STAPLER SKIN PROX 35W (STAPLE) IMPLANT
SUT MNCRL AB 4-0 PS2 18 (SUTURE) IMPLANT
SUT VIC AB 3-0 SH 18 (SUTURE) IMPLANT
SYR CONTROL 10ML LL (SYRINGE) ×1 IMPLANT
TOWEL OR 17X26 10 PK STRL BLUE (TOWEL DISPOSABLE) ×1 IMPLANT

## 2024-02-11 NOTE — Progress Notes (Signed)
 PROGRESS NOTE    Evelyn Watts  FMW:[id number] DOB: 20-Feb-1976 DOA: 02/10/2024 PCP: Oley Bascom RAMAN, NP   Brief Narrative:  Evelyn Watts is a 48 y.o. female who presents with right thigh pain concerning for infection.  Patient has known medical history significant for type 2 diabetes mellitus, essential hypertension, chronic hyponatremia with baseline sodium range 128-133, anemia of chronic disease with baseline hemoglobin 10-11.  Hospitalist called for admission, general surgery called in consult for right thigh cellulitis with concern for underlying abscess versus necrotizing fasciitis on imaging.  Assessment & Plan:   Principal Problem:   Cellulitis of right lower extremity Active Problems:   DM2 (diabetes mellitus, type 2) (HCC)   Sepsis (HCC)   Acute on chronic anemia   History of essential hypertension   Chronic hyponatremia   Sepsis due to cellulitis of the right medial thigh:  - Patient febrile with leukocytosis and notable infection, CT remarkable for soft tissue edema and possible underlying abscess versus necrotizing fasciitis given regions of questionable gas - General Surgery following, appreciate insight recommendations, plan for I&D later today on 02/11/2024 - Continue broad-spectrum antibiotics including ceftriaxone , linezolid  Acute anemia of acute illness on chronic anemia of chronic disease Concurrent iron deficiency anemia - Continue to follow clinically, no indication for transfusion at this time - Hold IV iron given acute illness/contraindications -initiate p.o. iron supplementation - No signs or symptoms of blood loss - Would have patient recheck iron panel outpatient in the next few weeks to months per PCP  Insulin -dependent type 2 diabetes, uncontrolled - A1c 11.6 earlier this summer -Postprocedure will advance diet to diabetic diet, reinitiate home medications including insulin   Essential hypertension, well-controlled   Hyponatremia,  chronic - Baseline around 130  Chronic hyperosmolar hyponatremia Baseline = 130, at baseline now   DVT prophylaxis: SCDs Start: 02/11/24 0124 Code Status:   Code Status: Full Code Family Communication: At bedside  Status is: Inpatient  Dispo: The patient is from: Home              Anticipated d/c is to: Home              Anticipated d/c date is: 48 to 72 hours              Patient currently not medically stable for discharge  Consultants:  General surgery  Procedures:  Right medial thigh I&D 10/19  Antimicrobials:  Linezolid, ceftriaxone   Subjective: No acute issues or events overnight, pain currently well-controlled, denies nausea vomiting headache fever chills or chest pain  Objective: Vitals:   02/11/24 0900 02/11/24 0915 02/11/24 0946 02/11/24 1334  BP: 102/67 130/67 134/67 132/76  Pulse: 95 100 96 95  Resp: (!) 22 (!) 23 18 20   Temp:  98.5 F (36.9 C) (!) 97.5 F (36.4 C) 98.5 F (36.9 C)  TempSrc:   Oral Oral  SpO2: 96% 97% 97% 95%  Weight:      Height:        Intake/Output Summary (Last 24 hours) at 02/11/2024 1400 Last data filed at 02/11/2024 0915 Gross per 24 hour  Intake 2578.95 ml  Output 510 ml  Net 2068.95 ml   Filed Weights   02/11/24 0338  Weight: 124 kg    Examination:  General:  Pleasantly resting in bed, No acute distress. HEENT:  Normocephalic atraumatic.  Sclerae nonicteric, noninjected.  Extraocular movements intact bilaterally. Neck:  Without mass or deformity.  Trachea is midline. Lungs:  Clear to auscultate bilaterally without rhonchi, wheeze, or  rales. Heart:  Regular rate and rhythm.  Without murmurs, rubs, or gallops. Abdomen:  Soft, nontender, nondistended.  Without guarding or rebound. Extremities: Without cyanosis, clubbing, edema, or obvious deformity. Skin: Medial right thigh bandage clean dry intact  Data Reviewed: I have personally reviewed following labs and imaging studies  CBC: Recent Labs  Lab  02/10/24 2112 02/11/24 1028  WBC 18.7* 17.7*  NEUTROABS 16.2* 14.4*  HGB 8.5* 7.4*  HCT 26.7* 23.4*  MCV 74.0* 73.6*  PLT 461* 386   Basic Metabolic Panel: Recent Labs  Lab 02/10/24 2112 02/11/24 1028  NA 127* 131*  K 3.7 3.7  CL 93* 98  CO2 21* 22  GLUCOSE 306* 240*  BUN 22* 22*  CREATININE 0.90 0.86  CALCIUM  8.9 8.7*  MG  --  2.1   GFR: Estimated Creatinine Clearance: 118.8 mL/min (by C-G formula based on SCr of 0.86 mg/dL). Liver Function Tests: Recent Labs  Lab 02/11/24 1028  AST 28  ALT 20  ALKPHOS 250*  BILITOT 0.3  PROT 6.4*  ALBUMIN 2.8*   No results for input(s): LIPASE, AMYLASE in the last 168 hours. No results for input(s): AMMONIA in the last 168 hours. Coagulation Profile: Recent Labs  Lab 02/11/24 1028  INR 1.1   Cardiac Enzymes: No results for input(s): CKTOTAL, CKMB, CKMBINDEX, TROPONINI in the last 168 hours. BNP (last 3 results) No results for input(s): PROBNP in the last 8760 hours. HbA1C: No results for input(s): HGBA1C in the last 72 hours. CBG: Recent Labs  Lab 02/11/24 0156 02/11/24 0523 02/11/24 0848 02/11/24 1144  GLUCAP 305* 223* 212* 248*   Lipid Profile: No results for input(s): CHOL, HDL, LDLCALC, TRIG, CHOLHDL, LDLDIRECT in the last 72 hours. Thyroid Function Tests: No results for input(s): TSH, T4TOTAL, FREET4, T3FREE, THYROIDAB in the last 72 hours. Anemia Panel: Recent Labs    02/11/24 1028  FERRITIN 161  TIBC 225*  IRON <10*   Sepsis Labs: Recent Labs  Lab 02/10/24 2327  LATICACIDVEN 1.3    Recent Results (from the past 240 hours)  Blood culture (routine x 2)     Status: None (Preliminary result)   Collection Time: 02/10/24 10:50 PM   Specimen: BLOOD RIGHT FOREARM  Result Value Ref Range Status   Specimen Description   Final    BLOOD RIGHT FOREARM Performed at [facility], [address], [place], [place] [address]    Special Requests   Final     BOTTLES DRAWN AEROBIC ONLY Blood Culture results may not be optimal due to an inadequate volume of blood received in culture bottles Performed at [facility], [address] [place], [place] [address]    Culture   Final    NO GROWTH < 12 HOURS Performed at [facility], [address] [place], [place] [address]    Report Status PENDING  Incomplete         Radiology Studies: CT FEMUR RIGHT W CONTRAST Result Date: 02/11/2024 EXAM: CT OF THE RIGHT FEMUR, WITH IV CONTRAST 02/10/2024 11:55:36 PM TECHNIQUE: Axial images were acquired through the right femur with IV contrast. Reformatted images were reviewed. Automated exposure control, iterative reconstruction, and/or weight based adjustment of the mA/kV was utilized to reduce the radiation dose to as low as reasonably achievable. COMPARISON: None available. CLINICAL HISTORY: Soft tissue infection, sepsis. FINDINGS: BONES: No acute fracture or focal osseous lesion. JOINTS: Tricompartmental moderate degenerative changes of the knee. No knee joint effusion. No hip joint effusion.  SOFT TISSUES: Medial proximal mid thigh subcutaneous soft tissue edema. No findings to suggest deep fascial plane soft tissue edema. Question couple of foci of gas associated with the edema (11: 189). Associated overlying dermal thickening. No organized fluid collection. No right inguinal lymphadenopathy. VASCULATURE: Atherosclerotic plaque. IMPRESSION: 1. Medial proximal mid thigh subcutaneous soft tissue edema with possible associated trace foci of gas. Findings suggestive of cellulitis with necrotizing fasciitis not excluded as this is a clinical diagnosis. 2. No organized fluid collection. 3. No findings to suggest deep fascial plane soft tissue edema. Electronically signed by: Morgane Naveau MD 02/11/2024 12:09 AM EDT RP Workstation: [id number]        Scheduled Meds:  ferrous sulfate   325 mg Oral TID WC   [START ON 02/12/2024] influenza vac  split trivalent PF  0.5 mL Intramuscular Tomorrow-1000   insulin  aspart  0-9 Units Subcutaneous Q6H   insulin  glargine-yfgn  12 Units Subcutaneous BID   Continuous Infusions:  cefTRIAXone  (ROCEPHIN )  IV     lactated ringers 150 mL/hr at 02/11/24 0957   linezolid (ZYVOX) IV 600 mg (02/11/24 1221)     LOS: 0 days   Time spent:  Elsie JAYSON Montclair, DO [facility]  If 7PM-7AM, please contact night-coverage [email or web address]  02/11/2024, 2:00 PM

## 2024-02-11 NOTE — H&P (Addendum)
 History and Physical      Evelyn Watts FMW:[id number] DOB: 1975-10-25 DOA: 02/10/2024; DOS: 02/11/2024  PCP: Oley Bascom RAMAN, NP  Patient coming from: home   I have personally briefly reviewed patient's old medical records in [facility]  Chief Complaint: Right thigh discomfort  HPI: Evelyn Watts is a 48 y.o. female with medical history significant for type 2 diabetes mellitus, essential hypertension, chronic hyponatremia with baseline sodium range 128-133, anemia of chronic disease with baseline hemoglobin 10-11, who is admitted to [facility] on 02/10/2024 with sepsis due to cellulitis of the medial right thigh after presenting from home to [facility] ED complaining of right thigh pain.   The patient reports 3 to 4 days of progressive medial right thigh discomfort associated with erythema, increased warmth, mild swelling, and more recently some mild purulent drainage.  She also notes associated subjective fever in the absence of any chills, will by rigors, or generalized myalgias.  Denies any known recent break in skin integrity in this area.  Denies any associated acute focal weakness nor any associated acute focal numbness or paresthesias.  She notes mild new onset cough over the course of the last day, but denies any associated shortness of breath.  No recent abdominal discomfort, dysuria, gross hematuria nor any recent neck stiffness.  Her medical history is notable for poorly controlled type 2 diabetes mellitus.    ED Course:  Vital signs in the ED were notable for the following: Temperature max 101.2; heart rates in the low 100s; systolic blood pressures in the 140s to 150s; respiratory rate 10-22, oxygen  saturation 98 to 99% on room air.  Labs were notable for the following: BMP was notable for the following: Sodium 127, which corrects to approximately 130 when taking into account concomitant hyperglycemia, potassium 3.7, creatinine 0.9, glucose 306.  Lactic acid  1.3.  CBC notable for white cell count 18,700 with 87% neutrophils, hemoglobin 8.5 associated microcytic and normochromic findings and is relative to most recent prior hemoglobin data point of 10.5 in November 2024, platelet count 461.  Type and screen ordered.  Blood cultures x 2 were collected prior to initiation of IV antibiotics.  Per my interpretation, EKG in ED demonstrated the following: No EKG performed in the ED today.  Imaging in the ED, per corresponding formal radiology read, was notable for the following: CT of the right femur with contrast showed medial proximal mid thigh subcutaneous soft tissue edema and possible trace foci of subcutaneous gas, will demonstrate no evidence of organized fluid collection, with the above findings consistent with cellulitis.  EDP discussed patient's case with on-call general surgery, Dr. Ebbie, who felt that the subcutaneous gas was more likely as a result of an area of active purulent drainage from the underlying cellulitis, and felt that necrotizing fasciitis was less likely at this time. Dr. Ebbie conveyed that general surgery will formally consult and anticipates taking patient to the OR in the morning for surgical debridement.    While in the ED, the following were administered: Dilaudid  1 mg IV x 1 dose, Rocephin , IV vancomycin , lactated Ringer's x 2 L bolus followed by continuous LR running at 150 cc/h.  Subsequently, the patient was admitted for further evaluation management of presenting sepsis due to cellulitis of the right medial thigh, with presenting labs also notable for acute on chronic anemia.    Review of Systems: As per HPI otherwise 10 point review of systems negative.   Past Medical History:  Diagnosis Date  Allergies    Asthma    Diabetes mellitus without complication (HCC)    Essential hypertension     Past Surgical History:  Procedure Laterality Date   CESAREAN SECTION     4   CHOLECYSTECTOMY     HERNIA REPAIR      TONSILLECTOMY      Social History:  reports that she has never smoked. She has never used smokeless tobacco. She reports that she does not drink alcohol and does not use drugs.   Allergies  Allergen Reactions   Latex Rash   Morphine And Codeine  Rash   Tape Rash    Family History  Problem Relation Age of Onset   Healthy Mother    Healthy Father    Hypertension Neg Hx    Diabetes Neg Hx     Family history reviewed and not pertinent    Prior to Admission medications   Medication Sig Start Date End Date Taking? Authorizing Provider  Accu-Chek Softclix Lancets lancets TEST BLOOD SUGAR AS DIRECTED 01/01/24   Oley Bascom RAMAN, NP  albuterol  (VENTOLIN  HFA) 108 (90 Base) MCG/ACT inhaler Inhale 2 puffs into the lungs every 4 (four) hours as needed for wheezing. 01/02/24   Oley Bascom RAMAN, NP  cetirizine  (ZYRTEC ) 10 MG tablet Take 1 tablet (10 mg total) by mouth daily. 01/02/24   Oley Bascom RAMAN, NP  Continuous Glucose Sensor (FREESTYLE LIBRE 3 PLUS SENSOR) MISC Change sensor every 15 days. Use to check glucose continuously using the [facility] 3 app. 10/03/23   Nichols, Tonya S, NP  cyclobenzaprine  (FLEXERIL ) 5 MG tablet Take 1 tablet (5 mg total) by mouth 3 (three) times daily as needed for muscle spasms. 09/12/23   Paseda, Folashade R, FNP  Ferrous Sulfate  90 (18 Fe) MG TABS Take 1 tablet by mouth daily. 01/02/24   Oley Bascom RAMAN, NP  gabapentin  (NEURONTIN ) 300 MG capsule TAKE 1 CAPSULE THREE TIMES DAILY 06/19/23   Nichols, Tonya S, NP  glucose blood (ACCU-CHEK GUIDE TEST) test strip TEST BLOOD SUGAR AS DIRECTED 01/01/24   Oley Bascom RAMAN, NP  IBU 600 MG tablet TAKE 1 TABLET EVERY 8 HOURS AS NEEDED 01/22/24   Nichols, Tonya S, NP  ibuprofen  (ADVIL ) 800 MG tablet Take 1 tablet (800 mg total) by mouth every 8 (eight) hours as needed. 11/09/23   Oley Bascom RAMAN, NP  insulin  glargine (LANTUS ) 100 UNIT/ML Solostar Pen Inject 30 Units into the skin 2 (two) times daily. 01/02/24   Oley Bascom RAMAN, NP  Insulin  Pen Needle (PEN NEEDLES) 32G X 4 MM MISC Use as directed to inject insulin  twice daily. 01/02/24   Oley Bascom RAMAN, NP  lisinopril  (ZESTRIL ) 20 MG tablet Take 1 tablet (20 mg total) by mouth daily. 01/02/24   Oley Bascom RAMAN, NP  metFORMIN  (GLUCOPHAGE -XR) 500 MG 24 hr tablet Take 2 tablets (1,000 mg total) by mouth 2 (two) times daily with a meal. 01/02/24   Oley Bascom RAMAN, NP  naproxen  (NAPROSYN ) 500 MG tablet TAKE 1 TABLET TWICE DAILY WITH MEALS 09/28/23   Nichols, Tonya S, NP  Nutritional Supplements (GLUCERNA 1.0 CAL) LIQD Take 1 Bottle by mouth 3 (three) times daily as needed. 11/09/23   Oley Bascom RAMAN, NP  rosuvastatin  (CRESTOR ) 40 MG tablet Take 1 tablet (40 mg total) by mouth daily. 01/02/24   Oley Bascom RAMAN, NP  Semaglutide , 1 MG/DOSE, 4 MG/3ML SOPN Inject 1 mg into the skin once a week. 11/20/23   Oley Bascom RAMAN, NP  Objective    Physical Exam: Vitals:   02/10/24 2107 02/11/24 0039  BP: (!) 159/100 (!) 175/97  Pulse: (!) 108 (!) 107  Resp: 20 20  Temp: 99.9 F (37.7 C) (!) 101.2 F (38.4 C)  TempSrc: Oral   SpO2: 99% 99%    General: appears to be stated age; alert, oriented Skin: warm, right medial thigh erythema, swelling  head:  AT/[place] Mouth:  Oral mucosa membranes appear dry, normal dentition Neck: supple; trachea midline Heart: Mildly tachycardic, but regular; did not appreciate any M/R/G Lungs: CTAB, did not appreciate any wheezes, rales, or rhonchi Abdomen: + BS; soft, ND, NT Vascular: 2+ pedal pulses b/l; 2+ radial pulses b/l Extremities: Right medial thigh erythema, swelling, as further noted above no muscle wasting      Labs on Admission: I have personally reviewed following labs and imaging studies  CBC: Recent Labs  Lab 02/10/24 2112  WBC 18.7*  NEUTROABS 16.2*  HGB 8.5*  HCT 26.7*  MCV 74.0*  PLT 461*   Basic Metabolic Panel: Recent Labs  Lab 02/10/24 2112  NA 127*  K 3.7  CL 93*  CO2 21*  GLUCOSE 306*  BUN 22*   CREATININE 0.90  CALCIUM  8.9   GFR: CrCl cannot be calculated (Unknown ideal weight.). Liver Function Tests: No results for input(s): AST, ALT, ALKPHOS, BILITOT, PROT, ALBUMIN in the last 168 hours. No results for input(s): LIPASE, AMYLASE in the last 168 hours. No results for input(s): AMMONIA in the last 168 hours. Coagulation Profile: No results for input(s): INR, PROTIME in the last 168 hours. Cardiac Enzymes: No results for input(s): CKTOTAL, CKMB, CKMBINDEX, TROPONINI in the last 168 hours. BNP (last 3 results) No results for input(s): PROBNP in the last 8760 hours. HbA1C: No results for input(s): HGBA1C in the last 72 hours. CBG: No results for input(s): GLUCAP in the last 168 hours. Lipid Profile: No results for input(s): CHOL, HDL, LDLCALC, TRIG, CHOLHDL, LDLDIRECT in the last 72 hours. Thyroid Function Tests: No results for input(s): TSH, T4TOTAL, FREET4, T3FREE, THYROIDAB in the last 72 hours. Anemia Panel: No results for input(s): VITAMINB12, FOLATE, FERRITIN, TIBC, IRON, RETICCTPCT in the last 72 hours. Urine analysis:    Component Value Date/Time   COLORURINE YELLOW 07/15/2022 1721   APPEARANCEUR HAZY (A) 07/15/2022 1721   LABSPEC 1.029 07/15/2022 1721   PHURINE 5.0 07/15/2022 1721   GLUCOSEU >=500 (A) 07/15/2022 1721   HGBUR LARGE (A) 07/15/2022 1721   BILIRUBINUR NEGATIVE 07/15/2022 1721   BILIRUBINUR negative 11/30/2017 1140   KETONESUR 5 (A) 07/15/2022 1721   PROTEINUR 100 (A) 07/15/2022 1721   UROBILINOGEN 0.2 08/09/2021 0851   NITRITE NEGATIVE 07/15/2022 1721   LEUKOCYTESUR NEGATIVE 07/15/2022 1721    Radiological Exams on Admission: CT FEMUR RIGHT W CONTRAST Result Date: 02/11/2024 EXAM: CT OF THE RIGHT FEMUR, WITH IV CONTRAST 02/10/2024 11:55:36 PM TECHNIQUE: Axial images were acquired through the right femur with IV contrast. Reformatted images were reviewed. Automated exposure  control, iterative reconstruction, and/or weight based adjustment of the mA/kV was utilized to reduce the radiation dose to as low as reasonably achievable. COMPARISON: None available. CLINICAL HISTORY: Soft tissue infection, sepsis. FINDINGS: BONES: No acute fracture or focal osseous lesion. JOINTS: Tricompartmental moderate degenerative changes of the knee. No knee joint effusion. No hip joint effusion. SOFT TISSUES: Medial proximal mid thigh subcutaneous soft tissue edema. No findings to suggest deep fascial plane soft tissue edema. Question couple of foci of gas associated with the edema (11: 189).  Associated overlying dermal thickening. No organized fluid collection. No right inguinal lymphadenopathy. VASCULATURE: Atherosclerotic plaque. IMPRESSION: 1. Medial proximal mid thigh subcutaneous soft tissue edema with possible associated trace foci of gas. Findings suggestive of cellulitis with necrotizing fasciitis not excluded as this is a clinical diagnosis. 2. No organized fluid collection. 3. No findings to suggest deep fascial plane soft tissue edema. Electronically signed by: Morgane Naveau MD 02/11/2024 12:09 AM EDT RP Workstation: [id number]      Assessment/Plan    Principal Problem:   Cellulitis of right lower extremity Active Problems:   DM2 (diabetes mellitus, type 2) (HCC)   Sepsis (HCC)   Acute on chronic anemia   History of essential hypertension   Chronic hyponatremia      #) Sepsis due to cellulitis of the right medial thigh: Diagnosis of the basis of 3 to 4 days of progressive right medial thigh erythema, tenderness, increased warmth, swelling, with presenting objective fever and neutrophilic predominant leukocytosis, with presenting CT scan showing evidence of associated soft tissue edema consistent with cellulitis, with potential small foci of subcutaneous gas, that is less likely to represent necrotizing fasciitis. Additionally, no evidence of crepitus on exam to suggest  necrotizing. Overall, radiographically and based upon physical exam, necrotizing fasciitis is felt to be less likely. Risk factors include patient's history of poorly controlled type 2 diabetes mellitus. Relationship between diabetes and cellulitis: diabetes serves as a predisposing factor for development of cellulitis.  SIRS criteria met via objective fever, leukocytosis, tachycardia, tachypnea. Of note, in the absence of any evidence of end organ damage, including a non-elevated presenting LA of 1.3, pt's sepsis does not meet criteria to be considered severe in nature. Also, in the absence of LA level greater than or equal to 4.0, and in the absence of any associated hypotension refractory to IVF's, there are no indications for administration of a 30 mL/kg IVF bolus at this time.   Additional ED work-up/management notable for: Blood cultures x 2 were collected prior to initiation of IV vancomycin  and Rocephin .  In the setting of CT evidence of potential small foci of subcutaneous gas, will change her IV linezolid for continuation of MRSA coverage, but with the added provision of antitoxin benefit a/w linezolid, and will continue existing Rocephin .  EDP discussed patient's case with on-call general surgery, Dr. Ebbie, who felt that the subcutaneous gas was more likely as a result of an area of active purulent drainage from the underlying cellulitis, and felt that necrotizing fasciitis was less likely at this time. Dr. Ebbie conveyed that general surgery will formally consult and anticipates taking patient to the OR in the morning for surgical debridement.    No e/o additional infectious process at this time, but will also check chest x-ray in setting of the patient's report of new onset nonproductive cough.  Will also check urinalysis.  Plan: CBC w/ diff and CMP in AM. Follow for results of blood cx's x 2. Abx: Linezolid, Rocephin , as above.  General surgery to formally consult, as above.  NPO.   Preoperative EKG. continuous lactated Ringer's.  As needed IV Dilaudid .  Chest x-ray, urinalysis, as above.  Prn acetaminophen  for fever.  Monitor on telemetry.  Check urine.                  #) Acute on chronic anemia: In the context of a documented history of chronic iron deficiency anemia with baseline hemoglobin 10-11, with the patient on daily oral iron supplementation as an outpatient, presenting  hemoglobin is found to be slightly lower at 8.5, without any overt evidence of active contributory bleed.  Potential contribution from anemia of acute illness in the context of sepsis due to cellulitis of the right medial thigh.  Plan: Prn iron studies, INR.  Type and screen ordered.  Repeat CBC in the morning.                     #) Type 2 Diabetes Mellitus: documented history of such. Home insulin  regimen: Lantus  30 units SQ twice daily.  She is also on metformin  as well as Ozempic  as an outpatient.  Her diabetes is complicated by history of peripheral polyneuropathy, for which she is on gabapentin  at home.  Is poorly controlled as an outpatient, with most recent hemoglobin A1c noted to be 11.6% when checked on 11/09/2023.  Will initiate slightly less than half of her basal insulin , and closely monitor ensuing c-Myc trend to further adjustments in basal insulin  dosing.  Plan: In the setting of current n.p.o. status, will pursue Accu-Cheks every 6 hours with moderate dose sliding scale insulin .  Lantus  12 units SQ twice daily, as above.  Hold home gabapentin  for now in the setting of current n.p.o. status.  Add on hemoglobin A1c level.  Hold home metformin  and Ozempic  during this hospitalization.                       #) Essential Hypertension: documented h/o such, with outpatient antihypertensive regimen including lisinopril .  SBP's in the ED today: 140s to 150s mmHg.   Plan: Close monitoring of subsequent BP via routine VS. setting of current n.p.o.  status, will hold home lisinopril  for now.                      #) Chronic hypoosmolar hyponatremia: Documented history of such, with baseline serum sodium range of 128-133 dating back to December 2022, with presenting corrected serum sodium level of 130 within his baseline range.  Plan: Monitor strict I's and O's and daily weights.  CMP in the morning.      DVT prophylaxis: SCD's   Code Status: Full code Family Communication: none Disposition Plan: Per Rounding Team Consults called: EDP discussed patient's case with on-call general surgery, Dr. Ebbie, who felt that the subcutaneous gas was more likely as a result of an area of active purulent drainage from the underlying cellulitis, and felt that necrotizing fasciitis was less likely at this time. Dr. Ebbie conveyed that general surgery will formally consult and anticipates taking patient to the OR in the morning for surgical debridement.  ;  Admission status: inpatient    I SPENT GREATER THAN 75  MINUTES IN CLINICAL CARE TIME/MEDICAL DECISION-MAKING IN COMPLETING THIS ADMISSION.     Chet Greenley B Kotaro Buer DO [facility] From 7PM - 7AM   02/11/2024, 1:30 AM

## 2024-02-11 NOTE — Op Note (Addendum)
 Preoperative diagnosis: right thigh abscess Postoperative diagnosis: same as above Procedure: incision and drainage right thigh abscess Dr Adina Bury EBL minimal Complications none Drains penrose drain connecting two incisions Specimens cultures to microbiology Sponge and needle count correct Dispo recovery stable  Indications: 64 yof with DM not well controlled presented with one week of right inner thigh abscess. For past 3 days this has been draining purulence.  She presented to the er and was found to have a wbc of 18k, Na of 127, glucose of 306, nl lactic acid. A CT was done that shows soft tissue edema and a question of foci of gas. No organized collection. We discussed incision and drainage.  Procedure: After informed consent obtained patient was taken to OR. She was on antibiotics.  SCDs in place. She was placed under general anesthesia without complication. She was placed in lithotomy. She was prepped and draped in standard sterile surgical fashion  I made an incision in the posterior opening with purulence draining. This was elliptical and leaves it very open. There was purulence that I cultured. I debrided some minor amount of tissue. This tracked anteriorly to another necrotic are.  I opened this as well with return of more purulence.  There again was some minor amount of necrotic tissue I debrided.  The two holes did connect. I irrigated copiously and the place a penrose and connected the 2 incisions. This was secured by nylon suture. I then placed betadine soaked gauze. She tolerated well and was transferred to pacu.   I tried to call her husband and went to her room but was unable to talk to him postop

## 2024-02-11 NOTE — Anesthesia Procedure Notes (Signed)
 Procedure Name: LMA Insertion Date/Time: 02/11/2024 7:58 AM  Performed by: Para Jerelene CROME, CRNAPre-anesthesia Checklist: Patient identified, Emergency Drugs available, Suction available and Patient being monitored Patient Re-evaluated:Patient Re-evaluated prior to induction Oxygen  Delivery Method: Circle system utilized Preoxygenation: Pre-oxygenation with 100% oxygen  Induction Type: IV induction Ventilation: Mask ventilation without difficulty LMA: LMA inserted LMA Size: 5.0 Number of attempts: 1 Placement Confirmation: positive ETCO2, CO2 detector and breath sounds checked- equal and bilateral ETT to lip (cm): No markings on the Ambu Aur[place]raight LMA size 5. Tube secured with: Tape Dental Injury: Teeth and Oropharynx as per pre-operative assessment  Comments: Atraumatic insertion of LMA size 5. Lips and teeth remain in preoperative condition.

## 2024-02-11 NOTE — Transfer of Care (Signed)
 Immediate Anesthesia Transfer of Care Note  Patient: Evelyn Watts  Procedure(s) Performed: IRRIGATION AND DEBRIDEMENT ABSCESS (Right: Thigh)  Patient Location: PACU  Anesthesia Type:General  Level of Consciousness: drowsy and patient cooperative  Airway & Oxygen  Therapy: Patient Spontanous Breathing and Patient connected to nasal cannula oxygen   Post-op Assessment: Report given to RN and Post -op Vital signs reviewed and stable  Post vital signs: Reviewed and stable  Last Vitals:  Vitals Value Taken Time  BP 103/58 02/11/24 08:40  Temp 36.9 C 02/11/24 08:40  Pulse 92 02/11/24 08:43  Resp 9 02/11/24 08:43  SpO2 95 % 02/11/24 08:43  Vitals shown include unfiled device data.  Last Pain:  Vitals:   02/11/24 0840  TempSrc:   PainSc: Asleep         Complications: No notable events documented.

## 2024-02-11 NOTE — Consult Note (Signed)
 Reason for Consult:abscess Referring Physician: Logan PA  Evelyn Watts is an 48 y.o. female.  HPI: 52 yof with DM not well controlled presented with one week of right inner thigh abscess.  For past 3 days this has been draining purulence.  Denies fever at home. Has lethargy.  She presented to the er and was found to have a wbc of 18k, Na of 127, glucose of 306, nl lactic acid.   A CT was done that shows soft tissue edema and a question of foci of gas. No organized collection.  I was asked to see her.   Past Medical History:  Diagnosis Date   Allergies    Asthma    Diabetes mellitus without complication (HCC)    Essential hypertension     Past Surgical History:  Procedure Laterality Date   CESAREAN SECTION     4   CHOLECYSTECTOMY     HERNIA REPAIR     TONSILLECTOMY      Family History  Problem Relation Age of Onset   Healthy Mother    Healthy Father    Hypertension Neg Hx    Diabetes Neg Hx     Social History:  reports that she has never smoked. She has never used smokeless tobacco. She reports that she does not drink alcohol and does not use drugs.  Allergies:  Allergies  Allergen Reactions   Latex Rash   Morphine And Codeine  Rash   Tape Rash    Medications: I have reviewed the patient's current medications.  Results for orders placed or performed during the hospital encounter of 02/10/24 (from the past 48 hours)  CBC with Differential     Status: Abnormal   Collection Time: 02/10/24  9:12 PM  Result Value Ref Range   WBC 18.7 (H) 4.0 - 10.5 K/uL   RBC 3.61 (L) 3.87 - 5.11 MIL/uL   Hemoglobin 8.5 (L) 12.0 - 15.0 g/dL    Comment: Reticulocyte Hemoglobin testing may be clinically indicated, consider ordering this additional test [id number]    HCT 26.7 (L) 36.0 - 46.0 %   MCV 74.0 (L) 80.0 - 100.0 fL   MCH 23.5 (L) 26.0 - 34.0 pg   MCHC 31.8 30.0 - 36.0 g/dL   RDW 83.7 (H) 88.4 - 84.4 %   Platelets 461 (H) 150 - 400 K/uL   nRBC 0.0 0.0 - 0.2 %    Neutrophils Relative % 87 %   Neutro Abs 16.2 (H) 1.7 - 7.7 K/uL   Lymphocytes Relative 5 %   Lymphs Abs 1.0 0.7 - 4.0 K/uL   Monocytes Relative 7 %   Monocytes Absolute 1.3 (H) 0.1 - 1.0 K/uL   Eosinophils Relative 0 %   Eosinophils Absolute 0.1 0.0 - 0.5 K/uL   Basophils Relative 0 %   Basophils Absolute 0.1 0.0 - 0.1 K/uL   Immature Granulocytes 1 %   Abs Immature Granulocytes 0.15 (H) 0.00 - 0.07 K/uL    Comment: Performed at [facility], [address] [place], [place] [address]  Basic metabolic panel     Status: Abnormal   Collection Time: 02/10/24  9:12 PM  Result Value Ref Range   Sodium 127 (L) 135 - 145 mmol/L   Potassium 3.7 3.5 - 5.1 mmol/L   Chloride 93 (L) 98 - 111 mmol/L   CO2 21 (L) 22 - 32 mmol/L   Glucose, Bld 306 (H) 70 - 99 mg/dL    Comment: Glucose reference range applies only to  samples taken after fasting for at least 8 hours.   BUN 22 (H) 6 - 20 mg/dL   Creatinine, Ser 9.09 0.44 - 1.00 mg/dL   Calcium  8.9 8.9 - 10.3 mg/dL   GFR, Estimated >39 >39 mL/min    Comment: (NOTE) Calculated using the CKD-EPI Creatinine Equation (2021)    Anion gap 13 5 - 15    Comment: Performed at [facility], [address], [place], [place] [address]  I-Stat CG4 Lactic Acid     Status: None   Collection Time: 02/10/24 11:27 PM  Result Value Ref Range   Lactic Acid, Venous 1.3 0.5 - 1.9 mmol/L  C-reactive protein     Status: Abnormal   Collection Time: 02/11/24 12:30 AM  Result Value Ref Range   CRP 32.9 (H) <1.0 mg/dL    Comment: Performed at [facility], [address] [place], [place] [address]  CBG monitoring, ED     Status: Abnormal   Collection Time: 02/11/24  1:56 AM  Result Value Ref Range   Glucose-Capillary 305 (H) 70 - 99 mg/dL    Comment: Glucose reference range applies only to samples taken after fasting for at least 8 hours.   Comment 1 Notify RN   Type and screen [facility]     Status:  None   Collection Time: 02/11/24  2:23 AM  Result Value Ref Range   ABO/RH(D) A POS    Antibody Screen NEG    Sample Expiration      02/14/2024,2359 Performed at [facility], [address], [place], [place] [address]   Glucose, capillary     Status: Abnormal   Collection Time: 02/11/24  5:23 AM  Result Value Ref Range   Glucose-Capillary 223 (H) 70 - 99 mg/dL    Comment: Glucose reference range applies only to samples taken after fasting for at least 8 hours.    CT FEMUR RIGHT W CONTRAST Result Date: 02/11/2024 EXAM: CT OF THE RIGHT FEMUR, WITH IV CONTRAST 02/10/2024 11:55:36 PM TECHNIQUE: Axial images were acquired through the right femur with IV contrast. Reformatted images were reviewed. Automated exposure control, iterative reconstruction, and/or weight based adjustment of the mA/kV was utilized to reduce the radiation dose to as low as reasonably achievable. COMPARISON: None available. CLINICAL HISTORY: Soft tissue infection, sepsis. FINDINGS: BONES: No acute fracture or focal osseous lesion. JOINTS: Tricompartmental moderate degenerative changes of the knee. No knee joint effusion. No hip joint effusion. SOFT TISSUES: Medial proximal mid thigh subcutaneous soft tissue edema. No findings to suggest deep fascial plane soft tissue edema. Question couple of foci of gas associated with the edema (11: 189). Associated overlying dermal thickening. No organized fluid collection. No right inguinal lymphadenopathy. VASCULATURE: Atherosclerotic plaque. IMPRESSION: 1. Medial proximal mid thigh subcutaneous soft tissue edema with possible associated trace foci of gas. Findings suggestive of cellulitis with necrotizing fasciitis not excluded as this is a clinical diagnosis. 2. No organized fluid collection. 3. No findings to suggest deep fascial plane soft tissue edema. Electronically signed by: Morgane Naveau MD 02/11/2024 12:09 AM EDT RP Workstation: [id number]    Review of  Systems  Unable to perform ROS: Acuity of condition   Blood pressure (!) 145/76, pulse (!) 103, temperature 98.7 F (37.1 C), temperature source Oral, resp. rate (!) 22, height 5' 11 (1.803 m), weight 124 kg, SpO2 94%. Physical Exam Vitals reviewed.  Constitutional:      Appearance: Normal appearance. She is obese. She  is diaphoretic.  Eyes:     General: No scleral icterus. Cardiovascular:     Rate and Rhythm: Tachycardia present.  Pulmonary:     Effort: Pulmonary effort is normal.  Abdominal:     Palpations: Abdomen is soft.  Skin:    General: Skin is warm.     Capillary Refill: Capillary refill takes less than 2 seconds.     Comments: Medial thigh with large area of erythema and central necrotic portion that has been draining, tender, no real fluctuance     Assessment/Plan: Right inner thigh infection -dont think with time course, exam and ct that this is NSTI -even though nothing found on ct with history of drainage I think this needs to be incised and drained possibly debrided. -discussed with she and her husband going to OR this am for this.  Discussed open wound, possible additional surgery as well  I reviewed all labs, vitals and ct scan . Discussed with ER PA  Evelyn Watts 02/11/2024, 6:47 AM

## 2024-02-11 NOTE — Consult Note (Signed)
 WOC consulted for right thigh abscess, CCS to take to surgery. They will address wound care postoperatively.   Christerpher Clos [facility], CNS, CWON-AP [phone]

## 2024-02-11 NOTE — Anesthesia Postprocedure Evaluation (Signed)
 Anesthesia Post Note  Patient: Evelyn Watts  Procedure(s) Performed: IRRIGATION AND DEBRIDEMENT ABSCESS (Right: Thigh)     Patient location during evaluation: PACU Anesthesia Type: General Level of consciousness: awake and alert Pain management: pain level controlled Vital Signs Assessment: post-procedure vital signs reviewed and stable Respiratory status: spontaneous breathing, nonlabored ventilation and respiratory function stable Cardiovascular status: stable and blood pressure returned to baseline Anesthetic complications: no   No notable events documented.  Last Vitals:  Vitals:   02/11/24 0915 02/11/24 0946  BP: 130/67 134/67  Pulse: 100 96  Resp: (!) 23 18  Temp: 36.9 C (!) 36.4 C  SpO2: 97% 97%                  Debby FORBES Like

## 2024-02-11 NOTE — Anesthesia Preprocedure Evaluation (Addendum)
 Anesthesia Evaluation  Patient identified by MRN, date of birth, ID band Patient awake    Reviewed: Allergy & Precautions, NPO status , Patient's Chart, lab work & pertinent test results  History of Anesthesia Complications Negative for: history of anesthetic complications  Airway Mallampati: II  TM Distance: >3 FB Neck ROM: Full    Dental  (+) Dental Advisory Given, Missing   Pulmonary asthma    Pulmonary exam normal        Cardiovascular hypertension, Pt. on medications Normal cardiovascular exam     Neuro/Psych  PSYCHIATRIC DISORDERS  Depression    negative neurological ROS     GI/Hepatic negative GI ROS, Neg liver ROS,,,  Endo/Other  diabetes, Type 2, Insulin  Dependent   Obesity Na 127 (132 corrected for hyperglycemia)    Renal/GU negative Renal ROS     Musculoskeletal negative musculoskeletal ROS (+)    Abdominal  (+) + obese  Peds  Hematology  (+) Blood dyscrasia, anemia   Anesthesia Other Findings   Reproductive/Obstetrics                              Anesthesia Physical Anesthesia Plan  ASA: 3  Anesthesia Plan: General   Post-op Pain Management: Ofirmev  IV (intra-op)*   Induction: Intravenous  PONV Risk Score and Plan: 3 and Treatment may vary due to age or medical condition, Ondansetron , Dexamethasone and Midazolam  Airway Management Planned: Oral ETT  Additional Equipment: None  Intra-op Plan:   Post-operative Plan: Extubation in OR  Informed Consent: I have reviewed the patients History and Physical, chart, labs and discussed the procedure including the risks, benefits and alternatives for the proposed anesthesia with the patient or authorized representative who has indicated his/her understanding and acceptance.     Dental advisory given  Plan Discussed with: CRNA and Anesthesiologist  Anesthesia Plan Comments:          Anesthesia Quick  Evaluation

## 2024-02-12 ENCOUNTER — Encounter (HOSPITAL_COMMUNITY): Payer: Self-pay | Admitting: General Surgery

## 2024-02-12 DIAGNOSIS — L03115 Cellulitis of right lower limb: Secondary | ICD-10-CM | POA: Diagnosis not present

## 2024-02-12 LAB — BASIC METABOLIC PANEL WITH GFR
Anion gap: 12 (ref 5–15)
BUN: 22 mg/dL — ABNORMAL HIGH (ref 6–20)
CO2: 21 mmol/L — ABNORMAL LOW (ref 22–32)
Calcium: 8.4 mg/dL — ABNORMAL LOW (ref 8.9–10.3)
Chloride: 96 mmol/L — ABNORMAL LOW (ref 98–111)
Creatinine, Ser: 0.88 mg/dL (ref 0.44–1.00)
GFR, Estimated: >60 mL/min (ref >60)
Glucose, Bld: 226 mg/dL — ABNORMAL HIGH (ref 70–99)
Potassium: 3.7 mmol/L (ref 3.5–5.1)
Sodium: 129 mmol/L — ABNORMAL LOW (ref 135–145)

## 2024-02-12 LAB — CBC
HCT: 23.1 % — ABNORMAL LOW (ref 36.0–46.0)
Hemoglobin: 7.4 g/dL — ABNORMAL LOW (ref 12.0–15.0)
MCH: 24.1 pg — ABNORMAL LOW (ref 26.0–34.0)
MCHC: 32 g/dL (ref 30.0–36.0)
MCV: 75.2 fL — ABNORMAL LOW (ref 80.0–100.0)
Platelets: 387 K/uL (ref 150–400)
RBC: 3.07 MIL/uL — ABNORMAL LOW (ref 3.87–5.11)
RDW: 16.8 % — ABNORMAL HIGH (ref 11.5–15.5)
WBC: 17.8 K/uL — ABNORMAL HIGH (ref 4.0–10.5)
nRBC: 0 % (ref 0.0–0.2)

## 2024-02-12 LAB — GLUCOSE, CAPILLARY
Glucose-Capillary: 218 mg/dL — ABNORMAL HIGH (ref 70–99)
Glucose-Capillary: 250 mg/dL — ABNORMAL HIGH (ref 70–99)
Glucose-Capillary: 258 mg/dL — ABNORMAL HIGH (ref 70–99)
Glucose-Capillary: 270 mg/dL — ABNORMAL HIGH (ref 70–99)

## 2024-02-12 MED ORDER — INSULIN GLARGINE-YFGN 100 UNIT/ML ~~LOC~~ SOLN
20.0000 [IU] | Freq: Two times a day (BID) | SUBCUTANEOUS | Status: DC
  Administered 2024-02-12 – 2024-02-19 (×14): 20 [IU] via SUBCUTANEOUS
  Filled 2024-02-12 (×15): qty 0.2

## 2024-02-12 MED ORDER — SODIUM CHLORIDE 0.9 % IV SOLN
2.0000 g | INTRAVENOUS | Status: DC
  Administered 2024-02-12 – 2024-02-13 (×2): 2 g via INTRAVENOUS
  Filled 2024-02-12 (×3): qty 20

## 2024-02-12 NOTE — Progress Notes (Signed)
 PROGRESS NOTE    Evelyn Watts  FMW:[id number] DOB: 1976-01-17 DOA: 02/10/2024 PCP: Oley Bascom RAMAN, NP   Brief Narrative:  Evelyn Watts is a 48 y.o. female who presents with right thigh pain concerning for infection.  Patient has known medical history significant for type 2 diabetes mellitus, essential hypertension, chronic hyponatremia with baseline sodium range 128-133, anemia of chronic disease with baseline hemoglobin 10-11.  Hospitalist called for admission, general surgery called in consult for right thigh cellulitis with concern for underlying abscess versus necrotizing fasciitis on imaging.  Assessment & Plan:   Principal Problem:   Cellulitis of right lower extremity Active Problems:   DM2 (diabetes mellitus, type 2) (HCC)   Sepsis (HCC)   Acute on chronic anemia   History of essential hypertension   Chronic hyponatremia   Sepsis due to cellulitis of the right medial thigh:  - Patient febrile with leukocytosis and notable infection, CT remarkable for soft tissue edema and possible underlying abscess versus necrotizing fasciitis given regions of questionable gas - General Surgery following, appreciate insight recommendations, successful I&D 02/11/2024 - Continue broad-spectrum antibiotics including ceftriaxone , linezolid  Acute anemia of acute illness on chronic anemia of chronic disease Concurrent iron deficiency anemia - Continue to follow clinically, no indication for transfusion at this time - Hold IV iron given acute illness/contraindications -initiate p.o. iron supplementation - No signs or symptoms of blood loss - Would have patient recheck iron panel outpatient in the next few weeks to months per PCP  Insulin -dependent type 2 diabetes, uncontrolled - A1c 11.6 earlier this summer -Postprocedure will advance diet to diabetic diet, reinitiate home medications including insulin   Essential hypertension, well-controlled   Hyponatremia, chronic - Baseline  around 130  Chronic hyperosmolar hyponatremia Baseline = 130, at baseline now   DVT prophylaxis: SCDs Start: 02/11/24 0124 Code Status:   Code Status: Full Code Family Communication: At bedside  Status is: Inpatient  Dispo: The patient is from: Home              Anticipated d/c is to: Home              Anticipated d/c date is: 48 to 72 hours              Patient currently not medically stable for discharge  Consultants:  General surgery  Procedures:  Right medial thigh I&D 10/19  Antimicrobials:  Linezolid, ceftriaxone   Subjective: No acute issues or events overnight, pain currently well-controlled, denies nausea vomiting headache fever chills or chest pain  Objective: Vitals:   02/12/24 0011 02/12/24 0300 02/12/24 0500 02/12/24 0553  BP: (!) 150/84   138/69  Pulse: 96   99  Resp: 16   16  Temp: 99.7 F (37.6 C) 99.1 F (37.3 C)  99 F (37.2 C)  TempSrc:  Oral    SpO2: 93%   93%  Weight:   125.7 kg   Height:        Intake/Output Summary (Last 24 hours) at 02/12/2024 0713 Last data filed at 02/12/2024 0300 Gross per 24 hour  Intake 2060 ml  Output 270 ml  Net 1790 ml   Filed Weights   02/11/24 0338 02/12/24 0500  Weight: 124 kg 125.7 kg    Examination:  General:  Pleasantly resting in bed, No acute distress. HEENT:  Normocephalic atraumatic.  Sclerae nonicteric, noninjected.  Extraocular movements intact bilaterally. Neck:  Without mass or deformity.  Trachea is midline. Lungs:  Clear to auscultate bilaterally without rhonchi, wheeze, or rales.  Heart:  Regular rate and rhythm.  Without murmurs, rubs, or gallops. Abdomen:  Soft, nontender, nondistended.  Without guarding or rebound. Extremities: Without cyanosis, clubbing, edema, or obvious deformity. Skin: Medial right thigh bandage clean dry intact  Data Reviewed: I have personally reviewed following labs and imaging studies  CBC: Recent Labs  Lab 02/10/24 2112 02/11/24 1028 02/12/24 0444   WBC 18.7* 17.7* 17.8*  NEUTROABS 16.2* 14.4*  --   HGB 8.5* 7.4* 7.4*  HCT 26.7* 23.4* 23.1*  MCV 74.0* 73.6* 75.2*  PLT 461* 386 387   Basic Metabolic Panel: Recent Labs  Lab 02/10/24 2112 02/11/24 1028 02/12/24 0444  NA 127* 131* 129*  K 3.7 3.7 3.7  CL 93* 98 96*  CO2 21* 22 21*  GLUCOSE 306* 240* 226*  BUN 22* 22* 22*  CREATININE 0.90 0.86 0.88  CALCIUM  8.9 8.7* 8.4*  MG  --  2.1  --    GFR: Estimated Creatinine Clearance: 117 mL/min (by C-G formula based on SCr of 0.88 mg/dL). Liver Function Tests: Recent Labs  Lab 02/11/24 1028  AST 28  ALT 20  ALKPHOS 250*  BILITOT 0.3  PROT 6.4*  ALBUMIN 2.8*   No results for input(s): LIPASE, AMYLASE in the last 168 hours. No results for input(s): AMMONIA in the last 168 hours. Coagulation Profile: Recent Labs  Lab 02/11/24 1028  INR 1.1   Cardiac Enzymes: No results for input(s): CKTOTAL, CKMB, CKMBINDEX, TROPONINI in the last 168 hours. BNP (last 3 results) No results for input(s): PROBNP in the last 8760 hours. HbA1C: Recent Labs    02/11/24 1028  HGBA1C 12.5*   CBG: Recent Labs  Lab 02/11/24 0848 02/11/24 1144 02/11/24 1655 02/12/24 0015 02/12/24 0527  GLUCAP 212* 248* 275* 270* 218*   Lipid Profile: No results for input(s): CHOL, HDL, LDLCALC, TRIG, CHOLHDL, LDLDIRECT in the last 72 hours. Thyroid Function Tests: No results for input(s): TSH, T4TOTAL, FREET4, T3FREE, THYROIDAB in the last 72 hours. Anemia Panel: Recent Labs    02/11/24 1028  FERRITIN 161  TIBC 225*  IRON <10*   Sepsis Labs: Recent Labs  Lab 02/10/24 2327  LATICACIDVEN 1.3    Recent Results (from the past 240 hours)  Blood culture (routine x 2)     Status: None (Preliminary result)   Collection Time: 02/10/24 10:50 PM   Specimen: BLOOD RIGHT FOREARM  Result Value Ref Range Status   Specimen Description   Final    BLOOD RIGHT FOREARM Performed at [facility], [address] [place], [place] [address]    Special Requests   Final    BOTTLES DRAWN AEROBIC ONLY Blood Culture results may not be optimal due to an inadequate volume of blood received in culture bottles Performed at [facility], [address] [place], [place] [address]    Culture   Final    NO GROWTH 1 DAY Performed at [facility], [address] [place], [place] [address]    Report Status PENDING  Incomplete  Surgical PCR screen     Status: Abnormal   Collection Time: 02/11/24  5:16 AM   Specimen: Nasal Mucosa; Nasal Swab  Result Value Ref Range Status   MRSA, PCR NEGATIVE NEGATIVE Final   Staphylococcus aureus POSITIVE (A) NEGATIVE Final    Comment: RESULT CALLED TO, READ BACK BY AND VERIFIED WITH: DICKIE, A RN @ 1839 02/11/24 CAL (NOTE) The Xpert SA Assay (FDA approved for NASAL specimens in patients 22  years of age and older), is one component of a comprehensive surveillance program. It is not intended to diagnose infection nor to guide or monitor treatment. Performed at [facility], [address] [place], [place] [address]   Aerobic/Anaerobic Culture w Gram Stain (surgical/deep wound)     Status: None (Preliminary result)   Collection Time: 02/11/24  8:31 AM   Specimen: Path fluid; Tissue  Result Value Ref Range Status   Specimen Description   Final    FLUID Performed at [facility], [address], [place], [place] [address]    Special Requests   Final    NONE Performed at [facility], [address], [place], [place] [address]    Gram Stain   Final    NO WBC SEEN FEW GRAM POSITIVE COCCI MODERATE GRAM NEGATIVE RODS Performed at [facility], [address], [place], [place] [address]    Culture PENDING  Incomplete   Report Status PENDING  Incomplete  Blood culture (routine x 2)     Status: None (Preliminary result)   Collection Time: 02/11/24 10:28 AM   Specimen: BLOOD  LEFT ARM  Result Value Ref Range Status   Specimen Description   Final    BLOOD LEFT ARM Performed at [facility], [address] [place], [place] [address]    Special Requests   Final    BOTTLES DRAWN AEROBIC AND ANAEROBIC Blood Culture results may not be optimal due to an inadequate volume of blood received in culture bottles Performed at [facility], [address] [place], [place] [address]    Culture   Final    NO GROWTH < 12 HOURS Performed at [facility], [address] [place], [place] [address]    Report Status PENDING  Incomplete         Radiology Studies: DG Chest Port 1 View Result Date: 02/11/2024 EXAM: 1 VIEW(S) XRAY OF THE CHEST 02/11/2024 12:45:00 PM COMPARISON: Chest x-ray dated 04/23/2021 CLINICAL HISTORY: Cough. FINDINGS: LUNGS AND PLEURA: Hypoventilatory changes. No focal pulmonary opacity. No pulmonary edema. No pleural effusion. No pneumothorax. HEART AND MEDIASTINUM: Borderline cardiac enlargement. BONES AND SOFT TISSUES: No acute osseous abnormality. IMPRESSION: 1. No acute cardiopulmonary process. 2. Borderline cardiomegaly. Electronically signed by: Luke Bun MD 02/11/2024 03:18 PM EDT RP Workstation: [id number]   CT FEMUR RIGHT W CONTRAST Result Date: 02/11/2024 EXAM: CT OF THE RIGHT FEMUR, WITH IV CONTRAST 02/10/2024 11:55:36 PM TECHNIQUE: Axial images were acquired through the right femur with IV contrast. Reformatted images were reviewed. Automated exposure control, iterative reconstruction, and/or weight based adjustment of the mA/kV was utilized to reduce the radiation dose to as low as reasonably achievable. COMPARISON: None available. CLINICAL HISTORY: Soft tissue infection, sepsis. FINDINGS: BONES: No acute fracture or focal osseous lesion. JOINTS: Tricompartmental moderate degenerative changes of the knee. No knee joint effusion. No hip joint effusion. SOFT TISSUES: Medial proximal mid thigh subcutaneous soft  tissue edema. No findings to suggest deep fascial plane soft tissue edema. Question couple of foci of gas associated with the edema (11: 189). Associated overlying dermal thickening. No organized fluid collection. No right inguinal lymphadenopathy. VASCULATURE: Atherosclerotic plaque. IMPRESSION: 1. Medial proximal mid thigh subcutaneous soft tissue edema with possible associated trace foci of gas. Findings suggestive of cellulitis with necrotizing fasciitis not excluded as this is a clinical diagnosis. 2. No organized fluid collection. 3. No findings to suggest deep fascial plane soft tissue edema.  Electronically signed by: Morgane Naveau MD 02/11/2024 12:09 AM EDT RP Workstation: [id number]        Scheduled Meds:  Chlorhexidine  Gluconate Cloth  6 each Topical Daily   ferrous sulfate   325 mg Oral TID WC   influenza vac split trivalent PF  0.5 mL Intramuscular Tomorrow-1000   insulin  aspart  0-9 Units Subcutaneous Q6H   insulin  glargine-yfgn  12 Units Subcutaneous BID   mupirocin ointment  1 Application Nasal BID   Continuous Infusions:  cefTRIAXone  (ROCEPHIN )  IV 1 g (02/11/24 1557)   linezolid (ZYVOX) IV 600 mg (02/11/24 2132)     LOS: 1 day   Time spent:  Elsie JAYSON [place], DO [facility]  If 7PM-7AM, please contact night-coverage [email or web address]  02/12/2024, 7:13 AM

## 2024-02-12 NOTE — Plan of Care (Signed)

## 2024-02-12 NOTE — TOC Initial Note (Signed)
 Transition of Care [facility]) - Initial/Assessment Note    Patient Details  Name: Evelyn Watts MRN: [id number] Date of Birth: 01-Mar-1976  Transition of Care [facility]) CM/SW Contact:    Bascom Service, RN Phone Number: 02/12/2024, 3:41 PM  Clinical Narrative:  d/c plan home. POD#! I&D R thigh wound-penrose drain. Monitor for d/c plans.                 Expected Discharge Plan: Home/Self Care Barriers to Discharge: Continued Medical Work up   Patient Goals and CMS Choice Patient states their goals for this hospitalization and ongoing recovery are:: Home CMS Medicare.gov Compare Post Acute Care list provided to:: Patient Choice offered to / list presented to : Patient [place] ownership interest in [facility].provided to:: Patient    Expected Discharge Plan and Services   Discharge Planning Services: CM Consult Post Acute Care Choice: Resumption of Svcs/PTA Provider Living arrangements for the past 2 months: Single Family Home                                      Prior Living Arrangements/Services Living arrangements for the past 2 months: Single Family Home Lives with:: Spouse                   Activities of Daily Living   ADL Screening (condition at time of admission) Independently performs ADLs?: Yes (appropriate for developmental age) Is the patient deaf or have difficulty hearing?: No Does the patient have difficulty seeing, even when wearing glasses/contacts?: No Does the patient have difficulty concentrating, remembering, or making decisions?: No  Permission Sought/Granted                  Emotional Assessment              Admission diagnosis:  Soft tissue infection [L08.9] Cellulitis of right lower extremity [L03.115] Sepsis, due to unspecified organism, unspecified whether acute organ dysfunction present [facility]) [A41.9] Patient Active Problem List   Diagnosis Date Noted   Cellulitis of right lower extremity 02/11/2024    Sepsis (HCC) 02/11/2024   Acute on chronic anemia 02/11/2024   History of essential hypertension 02/11/2024   Chronic hyponatremia 02/11/2024   Acute pain of right shoulder 09/12/2023   Dyslipidemia, goal LDL below 70 09/12/2023   Essential hypertension 01/20/2020   COVID-19 virus infection 01/20/2020   Type 2 diabetes mellitus with hyperglycemia, with long-term current use of insulin  (HCC) 02/14/2018   DM2 (diabetes mellitus, type 2) (HCC) 08/24/2017   Moderate episode of recurrent major depressive disorder (HCC) 05/24/2016   Morbid obesity (HCC) 05/24/2016   PCP:  Oley Bascom RAMAN, NP Pharmacy:   [facility] [phone] [address] [place], [place] - [address] AT [address] [place] [address] Phone: [phone] Fax: [phone]  [facility] - [address] [place] - [address] [place] [address] Phone: [phone] Fax: [phone]  [place] [facility] [address] [place] [address] Phone: [phone] Fax: [phone]     Social Drivers of Health (SDOH) Social History: SDOH Screenings   Food Insecurity: No Food Insecurity (02/11/2024)  Housing: Low Risk  (02/11/2024)  Transportation Needs: Unmet Transportation Needs (02/11/2024)  Utilities: Not At Risk (02/11/2024)  Depression (PHQ2-9): Low Risk  (09/19/2023)  Financial Resource Strain: Low Risk  (09/13/2023)  Tobacco Use: Low Risk  (02/11/2024)  SDOH Interventions:     Readmission Risk Interventions     No data to display

## 2024-02-12 NOTE — Progress Notes (Addendum)
 1 Day Post-Op  Subjective: CC: R thigh pain improved. Seen with RN. Fever resolved. Tachycardia resolved. BP improved. WBC stable. She reports her CBG's at home were 400's prior to presentation.   Objective: Vital signs in last 24 hours: Temp:  [98.5 F (36.9 C)-101.2 F (38.4 C)] 98.9 F (37.2 C) (10/20 0900) Pulse Rate:  [95-106] 97 (10/20 0900) Resp:  [16-20] 16 (10/20 0553) BP: (108-151)/(59-88) 108/59 (10/20 0900) SpO2:  [93 %-95 %] 93 % (10/20 0900) Weight:  [125.7 kg] 125.7 kg (10/20 0500) Last BM Date : 02/10/24  Intake/Output from previous day: 10/19 0701 - 10/20 0700 In: 2060 [P.O.:360; I.V.:900; IV Piggyback:800] Out: 270 [Urine:260; Blood:10] Intake/Output this shift: Total I/O In: 240 [P.O.:240] Out: -   PE: Gen:  Alert, NAD, pleasant R thigh: Seen with RN, Elspeth Lands. R thigh wound as noted below. Packing removed. Wounds x 2 connected via penrose. Superior incision w/ some cauterized vs necrotic tissue mixed w/ fibrinous tissue. No active drainage. Ineferior wound clean without drainage. There is surrounding erythema and induration extending to areas marked as noted in picture. Does not extend outside area marked. There is some soft tissue between the 2 wound openings.      Lab Results:  Recent Labs    02/11/24 1028 02/12/24 0444  WBC 17.7* 17.8*  HGB 7.4* 7.4*  HCT 23.4* 23.1*  PLT 386 387   BMET Recent Labs    02/11/24 1028 02/12/24 0444  NA 131* 129*  K 3.7 3.7  CL 98 96*  CO2 22 21*  GLUCOSE 240* 226*  BUN 22* 22*  CREATININE 0.86 0.88  CALCIUM  8.7* 8.4*   PT/INR Recent Labs    02/11/24 1028  LABPROT 15.1  INR 1.1   CMP     Component Value Date/Time   NA 129 (L) 02/12/2024 0444   NA 133 (L) 09/12/2023 1553   K 3.7 02/12/2024 0444   CL 96 (L) 02/12/2024 0444   CO2 21 (L) 02/12/2024 0444   GLUCOSE 226 (H) 02/12/2024 0444   BUN 22 (H) 02/12/2024 0444   BUN 14 09/12/2023 1553   CREATININE 0.88 02/12/2024 0444    CALCIUM  8.4 (L) 02/12/2024 0444   PROT 6.4 (L) 02/11/2024 1028   PROT 6.9 09/12/2023 1553   ALBUMIN 2.8 (L) 02/11/2024 1028   ALBUMIN 3.8 (L) 09/12/2023 1553   AST 28 02/11/2024 1028   ALT 20 02/11/2024 1028   ALKPHOS 250 (H) 02/11/2024 1028   BILITOT 0.3 02/11/2024 1028   BILITOT <0.2 09/12/2023 1553   GFRNONAA >60 02/12/2024 0444   GFRAA >60 01/19/2020 2119   Lipase     Component Value Date/Time   LIPASE 21 07/15/2022 1816    Studies/Results: DG Chest Port 1 View Result Date: 02/11/2024 EXAM: 1 VIEW(S) XRAY OF THE CHEST 02/11/2024 12:45:00 PM COMPARISON: Chest x-ray dated 04/23/2021 CLINICAL HISTORY: Cough. FINDINGS: LUNGS AND PLEURA: Hypoventilatory changes. No focal pulmonary opacity. No pulmonary edema. No pleural effusion. No pneumothorax. HEART AND MEDIASTINUM: Borderline cardiac enlargement. BONES AND SOFT TISSUES: No acute osseous abnormality. IMPRESSION: 1. No acute cardiopulmonary process. 2. Borderline cardiomegaly. Electronically signed by: Luke Bun MD 02/11/2024 03:18 PM EDT RP Workstation: [id number]   CT FEMUR RIGHT W CONTRAST Result Date: 02/11/2024 EXAM: CT OF THE RIGHT FEMUR, WITH IV CONTRAST 02/10/2024 11:55:36 PM TECHNIQUE: Axial images were acquired through the right femur with IV contrast. Reformatted images were reviewed. Automated exposure control, iterative reconstruction, and/or weight based adjustment of the mA/kV was utilized  to reduce the radiation dose to as low as reasonably achievable. COMPARISON: None available. CLINICAL HISTORY: Soft tissue infection, sepsis. FINDINGS: BONES: No acute fracture or focal osseous lesion. JOINTS: Tricompartmental moderate degenerative changes of the knee. No knee joint effusion. No hip joint effusion. SOFT TISSUES: Medial proximal mid thigh subcutaneous soft tissue edema. No findings to suggest deep fascial plane soft tissue edema. Question couple of foci of gas associated with the edema (11: 189). Associated overlying  dermal thickening. No organized fluid collection. No right inguinal lymphadenopathy. VASCULATURE: Atherosclerotic plaque. IMPRESSION: 1. Medial proximal mid thigh subcutaneous soft tissue edema with possible associated trace foci of gas. Findings suggestive of cellulitis with necrotizing fasciitis not excluded as this is a clinical diagnosis. 2. No organized fluid collection. 3. No findings to suggest deep fascial plane soft tissue edema. Electronically signed by: Morgane Naveau MD 02/11/2024 12:09 AM EDT RP Workstation: [id number]    Anti-infectives: Anti-infectives (From admission, onward)    Start     Dose/Rate Route Frequency Ordered Stop   02/11/24 1600  cefTRIAXone  (ROCEPHIN ) 1 g in sodium chloride  0.9 % 100 mL IVPB        1 g 200 mL/hr over 30 Minutes Intravenous Every 24 hours 02/11/24 0127     02/11/24 1200  linezolid (ZYVOX) IVPB 600 mg        600 mg 300 mL/hr over 60 Minutes Intravenous Every 12 hours 02/11/24 0127     02/10/24 2245  cefTRIAXone  (ROCEPHIN ) 2 g in sodium chloride  0.9 % 100 mL IVPB        2 g 200 mL/hr over 30 Minutes Intravenous Once 02/10/24 2231 02/10/24 2322   02/10/24 2245  vancomycin  (VANCOCIN ) IVPB 1000 mg/200 mL premix        1,000 mg 200 mL/hr over 60 Minutes Intravenous  Once 02/10/24 2231 02/11/24 0131        Assessment/Plan POD 1 s/p incision and drainage right thigh abscess by Dr. Ebbie on 02/11/24 - No evidence of NSTI intra-op. Monitor.  - No indication for further drainage in the OR at this time. Will continue to monitor.  - Packing out - Cont penrose drain - Cont abx. Cx pending - Message sent to [facility] to provide update  FEN - CM VTE - SCDs, okay for chem ppx from a general surgery standpoint ID - Rocephin /Linezolid    LOS: 1 day    Ozell CHRISTELLA Shaper, [facility] Surgery 02/12/2024, 11:20 AM Please see Amion for pager number during day hours 7:00am-4:30pm

## 2024-02-13 ENCOUNTER — Other Ambulatory Visit: Payer: Self-pay

## 2024-02-13 DIAGNOSIS — L03115 Cellulitis of right lower limb: Secondary | ICD-10-CM | POA: Diagnosis not present

## 2024-02-13 LAB — BASIC METABOLIC PANEL WITH GFR
Anion gap: 11 (ref 5–15)
BUN: 17 mg/dL (ref 6–20)
CO2: 23 mmol/L (ref 22–32)
Calcium: 8.7 mg/dL — ABNORMAL LOW (ref 8.9–10.3)
Chloride: 96 mmol/L — ABNORMAL LOW (ref 98–111)
Creatinine, Ser: 0.75 mg/dL (ref 0.44–1.00)
GFR, Estimated: >60 mL/min (ref >60)
Glucose, Bld: 218 mg/dL — ABNORMAL HIGH (ref 70–99)
Potassium: 3.8 mmol/L (ref 3.5–5.1)
Sodium: 130 mmol/L — ABNORMAL LOW (ref 135–145)

## 2024-02-13 LAB — CBC
HCT: 24.1 % — ABNORMAL LOW (ref 36.0–46.0)
Hemoglobin: 7.6 g/dL — ABNORMAL LOW (ref 12.0–15.0)
MCH: 23.9 pg — ABNORMAL LOW (ref 26.0–34.0)
MCHC: 31.5 g/dL (ref 30.0–36.0)
MCV: 75.8 fL — ABNORMAL LOW (ref 80.0–100.0)
Platelets: 454 K/uL — ABNORMAL HIGH (ref 150–400)
RBC: 3.18 MIL/uL — ABNORMAL LOW (ref 3.87–5.11)
RDW: 16.8 % — ABNORMAL HIGH (ref 11.5–15.5)
WBC: 17 K/uL — ABNORMAL HIGH (ref 4.0–10.5)
nRBC: 0.1 % (ref 0.0–0.2)

## 2024-02-13 LAB — GLUCOSE, CAPILLARY
Glucose-Capillary: 216 mg/dL — ABNORMAL HIGH (ref 70–99)
Glucose-Capillary: 239 mg/dL — ABNORMAL HIGH (ref 70–99)
Glucose-Capillary: 255 mg/dL — ABNORMAL HIGH (ref 70–99)
Glucose-Capillary: 284 mg/dL — ABNORMAL HIGH (ref 70–99)

## 2024-02-13 MED ORDER — ACETAMINOPHEN 500 MG PO TABS
1000.0000 mg | ORAL_TABLET | Freq: Four times a day (QID) | ORAL | Status: DC
  Administered 2024-02-13 – 2024-02-19 (×24): 1000 mg via ORAL
  Filled 2024-02-13 (×24): qty 2

## 2024-02-13 MED ORDER — HYDROMORPHONE HCL 1 MG/ML IJ SOLN
0.5000 mg | INTRAMUSCULAR | Status: DC | PRN
  Administered 2024-02-13 – 2024-02-18 (×23): 0.5 mg via INTRAVENOUS
  Filled 2024-02-13 (×26): qty 0.5

## 2024-02-13 MED ORDER — METHOCARBAMOL 500 MG PO TABS
500.0000 mg | ORAL_TABLET | Freq: Four times a day (QID) | ORAL | Status: DC | PRN
  Administered 2024-02-14: 500 mg via ORAL
  Filled 2024-02-13 (×2): qty 1

## 2024-02-13 MED ORDER — OXYCODONE HCL 5 MG PO TABS
5.0000 mg | ORAL_TABLET | ORAL | Status: DC | PRN
  Administered 2024-02-13 – 2024-02-19 (×13): 10 mg via ORAL
  Filled 2024-02-13 (×15): qty 2

## 2024-02-13 NOTE — Plan of Care (Signed)
  Problem: Education: Goal: Ability to describe self-care measures that may prevent or decrease complications (Diabetes Survival Skills Education) will improve 02/13/2024 1501 by Rosanne Elspeth HERO, RN Outcome: Progressing 02/13/2024 1257 by Rosanne Elspeth HERO, RN Outcome: Progressing Goal: Individualized Educational Video(s) Outcome: Progressing   Problem: Coping: Goal: Ability to adjust to condition or change in health will improve 02/13/2024 1501 by Rosanne Elspeth HERO, RN Outcome: Progressing 02/13/2024 1257 by Rosanne Elspeth HERO, RN Outcome: Progressing   Problem: Fluid Volume: Goal: Ability to maintain a balanced intake and output will improve 02/13/2024 1501 by Rosanne Elspeth HERO, RN Outcome: Progressing 02/13/2024 1257 by Rosanne Elspeth HERO, RN Outcome: Progressing   Problem: Health Behavior/Discharge Planning: Goal: Ability to identify and utilize available resources and services will improve 02/13/2024 1501 by Rosanne Elspeth HERO, RN Outcome: Progressing 02/13/2024 1257 by Rosanne Elspeth HERO, RN Outcome: Progressing Goal: Ability to manage health-related needs will improve 02/13/2024 1501 by Rosanne Elspeth HERO, RN Outcome: Progressing 02/13/2024 1257 by Rosanne Elspeth HERO, RN Outcome: Progressing   Problem: Metabolic: Goal: Ability to maintain appropriate glucose levels will improve 02/13/2024 1501 by Rosanne Elspeth HERO, RN Outcome: Progressing 02/13/2024 1257 by Rosanne Elspeth HERO, RN Outcome: Progressing   Problem: Nutritional: Goal: Maintenance of adequate nutrition will improve 02/13/2024 1501 by Rosanne Elspeth HERO, RN Outcome: Progressing 02/13/2024 1257 by Rosanne Elspeth HERO, RN Outcome: Progressing Goal: Progress toward achieving an optimal weight will improve 02/13/2024 1501 by Rosanne Elspeth HERO, RN Outcome: Progressing 02/13/2024 1257 by Rosanne Elspeth HERO, RN Outcome: Not Progressing   Problem: Skin  Integrity: Goal: Risk for impaired skin integrity will decrease 02/13/2024 1501 by Rosanne Elspeth HERO, RN Outcome: Progressing 02/13/2024 1257 by Rosanne Elspeth HERO, RN Outcome: Progressing   Problem: Tissue Perfusion: Goal: Adequacy of tissue perfusion will improve 02/13/2024 1501 by Rosanne Elspeth HERO, RN Outcome: Progressing 02/13/2024 1257 by Rosanne Elspeth HERO, RN Outcome: Progressing   Problem: Education: Goal: Knowledge of General Education information will improve Description: Including pain rating scale, medication(s)/side effects and non-pharmacologic comfort measures 02/13/2024 1501 by Rosanne Elspeth HERO, RN Outcome: Progressing 02/13/2024 1257 by Rosanne Elspeth HERO, RN Outcome: Progressing   Problem: Health Behavior/Discharge Planning: Goal: Ability to manage health-related needs will improve 02/13/2024 1501 by Rosanne Elspeth HERO, RN Outcome: Progressing 02/13/2024 1257 by Rosanne Elspeth HERO, RN Outcome: Progressing   Problem: Clinical Measurements: Goal: Ability to maintain clinical measurements within normal limits will improve 02/13/2024 1501 by Rosanne Elspeth HERO, RN Outcome: Progressing 02/13/2024 1257 by Rosanne Elspeth HERO, RN Outcome: Progressing Goal: Will remain free from infection 02/13/2024 1501 by Rosanne Elspeth HERO, RN Outcome: Progressing 02/13/2024 1257 by Rosanne Elspeth HERO, RN Outcome: Progressing Goal: Diagnostic test results will improve Outcome: Progressing Goal: Respiratory complications will improve Outcome: Progressing Goal: Cardiovascular complication will be avoided Outcome: Progressing   Problem: Activity: Goal: Risk for activity intolerance will decrease Outcome: Progressing   Problem: Nutrition: Goal: Adequate nutrition will be maintained Outcome: Progressing   Problem: Coping: Goal: Level of anxiety will decrease Outcome: Progressing   Problem: Elimination: Goal: Will not experience  complications related to bowel motility Outcome: Progressing Goal: Will not experience complications related to urinary retention Outcome: Progressing   Problem: Pain Managment: Goal: General experience of comfort will improve and/or be controlled Outcome: Progressing   Problem: Safety: Goal: Ability to remain free from injury will improve Outcome: Progressing   Problem: Skin Integrity: Goal: Risk for impaired skin integrity will decrease Outcome: Progressing

## 2024-02-13 NOTE — Plan of Care (Signed)
  Problem: Education: Goal: Ability to describe self-care measures that may prevent or decrease complications (Diabetes Survival Skills Education) will improve Outcome: Progressing   Problem: Coping: Goal: Ability to adjust to condition or change in health will improve Outcome: Progressing   Problem: Fluid Volume: Goal: Ability to maintain a balanced intake and output will improve Outcome: Progressing   Problem: Health Behavior/Discharge Planning: Goal: Ability to identify and utilize available resources and services will improve Outcome: Progressing Goal: Ability to manage health-related needs will improve Outcome: Progressing   Problem: Metabolic: Goal: Ability to maintain appropriate glucose levels will improve Outcome: Progressing   Problem: Nutritional: Goal: Maintenance of adequate nutrition will improve Outcome: Progressing   Problem: Skin Integrity: Goal: Risk for impaired skin integrity will decrease Outcome: Progressing   Problem: Tissue Perfusion: Goal: Adequacy of tissue perfusion will improve Outcome: Progressing   Problem: Education: Goal: Knowledge of General Education information will improve Description: Including pain rating scale, medication(s)/side effects and non-pharmacologic comfort measures Outcome: Progressing   Problem: Health Behavior/Discharge Planning: Goal: Ability to manage health-related needs will improve Outcome: Progressing   Problem: Clinical Measurements: Goal: Ability to maintain clinical measurements within normal limits will improve Outcome: Progressing Goal: Will remain free from infection Outcome: Progressing   Problem: Nutritional: Goal: Progress toward achieving an optimal weight will improve Outcome: Not Progressing

## 2024-02-13 NOTE — Progress Notes (Signed)
 PROGRESS NOTE    Evelyn Watts  FMW:[id number] DOB: February 17, 1976 DOA: 02/10/2024 PCP: Oley Bascom RAMAN, NP   Brief Narrative:  Evelyn Watts is a 48 y.o. female who presents with right thigh pain concerning for infection.  Patient has known medical history significant for type 2 diabetes mellitus, essential hypertension, chronic hyponatremia with baseline sodium range 128-133, anemia of chronic disease with baseline hemoglobin 10-11.  Hospitalist called for admission, general surgery called in consult for right thigh cellulitis with concern for underlying abscess versus necrotizing fasciitis on imaging.  Assessment & Plan:   Principal Problem:   Cellulitis of right lower extremity Active Problems:   DM2 (diabetes mellitus, type 2) (HCC)   Sepsis (HCC)   Acute on chronic anemia   History of essential hypertension   Chronic hyponatremia  Sepsis due to cellulitis of the right medial thigh:  - Patient febrile with leukocytosis and notable infection, CT remarkable for soft tissue edema and possible underlying abscess versus necrotizing fasciitis given regions of questionable gas - General Surgery following, appreciate insight recommendations, successful I&D 02/11/2024 - Patient may benefit from repeat evaluation in the OR - Continue broad-spectrum antibiotics including ceftriaxone , linezolid  Acute anemia of acute illness on chronic anemia of chronic disease Concurrent iron deficiency anemia - Continue to follow clinically, no indication for transfusion at this time - Hold IV iron given acute illness/contraindications -initiate p.o. iron supplementation - No signs or symptoms of blood loss - Would have patient recheck iron panel outpatient in the next few weeks to months per PCP  Insulin -dependent type 2 diabetes, uncontrolled - A1c 11.6 earlier this year - Continue to advance diet per surgery, insulin  continues to increase as appropriate 20 unit glargine twice daily plus sliding  scale  Essential hypertension - Well-controlled  Hyponatremia, chronic - Baseline around 130 Chronic hyperosmolar hyponatremia Baseline = 130, at baseline now  DVT prophylaxis: SCDs Start: 02/11/24 0124 Code Status:   Code Status: Full Code Family Communication: At bedside  Status is: Inpatient  Dispo: The patient is from: Home              Anticipated d/c is to: Home              Anticipated d/c date is: 48 to 72 hours              Patient currently not medically stable for discharge  Consultants:  General surgery  Procedures:  Right medial thigh I&D 10/19  Antimicrobials:  Linezolid, ceftriaxone   Subjective: No acute issues or events overnight, pain currently well-controlled, denies nausea vomiting headache fever chills or chest pain  Objective: Vitals:   02/12/24 1344 02/12/24 2109 02/13/24 0500 02/13/24 0603  BP: 125/75 (!) 140/79  (!) 153/71  Pulse: 93 100  95  Resp:  20  20  Temp: 98 F (36.7 C) 99.9 F (37.7 C)  98.7 F (37.1 C)  TempSrc: Oral   Oral  SpO2: 94% 94%  96%  Weight:   124.3 kg   Height:        Intake/Output Summary (Last 24 hours) at 02/13/2024 0752 Last data filed at 02/12/2024 2300 Gross per 24 hour  Intake 1600.09 ml  Output --  Net 1600.09 ml   Filed Weights   02/11/24 0338 02/12/24 0500 02/13/24 0500  Weight: 124 kg 125.7 kg 124.3 kg    Examination:  General:  Pleasantly resting in bed, No acute distress. HEENT:  Normocephalic atraumatic.  Sclerae nonicteric, noninjected.  Extraocular movements intact bilaterally. Neck:  Without mass or deformity.  Trachea is midline. Lungs:  Clear to auscultate bilaterally without rhonchi, wheeze, or rales. Heart:  Regular rate and rhythm.  Without murmurs, rubs, or gallops. Abdomen:  Soft, nontender, nondistended.  Without guarding or rebound. Extremities: Without cyanosis, clubbing, edema, or obvious deformity. Skin: Medial right thigh bandage clean dry intact  Data Reviewed: I have  personally reviewed following labs and imaging studies  CBC: Recent Labs  Lab 02/10/24 2112 02/11/24 1028 02/12/24 0444 02/13/24 0504  WBC 18.7* 17.7* 17.8* 17.0*  NEUTROABS 16.2* 14.4*  --   --   HGB 8.5* 7.4* 7.4* 7.6*  HCT 26.7* 23.4* 23.1* 24.1*  MCV 74.0* 73.6* 75.2* 75.8*  PLT 461* 386 387 454*   Basic Metabolic Panel: Recent Labs  Lab 02/10/24 2112 02/11/24 1028 02/12/24 0444  NA 127* 131* 129*  K 3.7 3.7 3.7  CL 93* 98 96*  CO2 21* 22 21*  GLUCOSE 306* 240* 226*  BUN 22* 22* 22*  CREATININE 0.90 0.86 0.88  CALCIUM  8.9 8.7* 8.4*  MG  --  2.1  --    GFR: Estimated Creatinine Clearance: 116.3 mL/min (by C-G formula based on SCr of 0.88 mg/dL). Liver Function Tests: Recent Labs  Lab 02/11/24 1028  AST 28  ALT 20  ALKPHOS 250*  BILITOT 0.3  PROT 6.4*  ALBUMIN 2.8*   Coagulation Profile: Recent Labs  Lab 02/11/24 1028  INR 1.1   HbA1C: Recent Labs    02/11/24 1028  HGBA1C 12.5*   CBG: Recent Labs  Lab 02/12/24 0527 02/12/24 1124 02/12/24 1843 02/13/24 0007 02/13/24 0614  GLUCAP 218* 258* 250* 284* 216*   Anemia Panel: Recent Labs    02/11/24 1028  FERRITIN 161  TIBC 225*  IRON <10*   Sepsis Labs: Recent Labs  Lab 02/10/24 2327  LATICACIDVEN 1.3    Recent Results (from the past 240 hours)  Blood culture (routine x 2)     Status: None (Preliminary result)   Collection Time: 02/10/24 10:50 PM   Specimen: BLOOD RIGHT FOREARM  Result Value Ref Range Status   Specimen Description   Final    BLOOD RIGHT FOREARM Performed at [facility], [address] [place], [place] [address]    Special Requests   Final    BOTTLES DRAWN AEROBIC ONLY Blood Culture results may not be optimal due to an inadequate volume of blood received in culture bottles Performed at [facility], [address], [place], [place] [address]    Culture   Final    NO GROWTH 2 DAYS Performed at [facility], [address],  [place], [place] [address]    Report Status PENDING  Incomplete  Surgical PCR screen     Status: Abnormal   Collection Time: 02/11/24  5:16 AM   Specimen: Nasal Mucosa; Nasal Swab  Result Value Ref Range Status   MRSA, PCR NEGATIVE NEGATIVE Final   Staphylococcus aureus POSITIVE (A) NEGATIVE Final    Comment: RESULT CALLED TO, READ BACK BY AND VERIFIED WITH: DICKIE, A RN @ 1839 02/11/24 CAL (NOTE) The Xpert SA Assay (FDA approved for NASAL specimens in patients 82 years of age and older), is one component of a comprehensive surveillance program. It is not intended to diagnose infection nor to guide or monitor treatment. Performed at [facility], [address] [place], [place] [address]   Aerobic/Anaerobic Culture w Gram Stain (surgical/deep wound)     Status: None (Preliminary  result)   Collection Time: 02/11/24  8:31 AM   Specimen: Path fluid; Tissue  Result Value Ref Range Status   Specimen Description   Final    FLUID Performed at [facility], [address], [place], [place] [address]    Special Requests   Final    NONE Performed at [facility], [address] [place], [place] [address]    Gram Stain   Final    NO WBC SEEN FEW GRAM POSITIVE COCCI MODERATE GRAM NEGATIVE RODS    Culture   Final    CULTURE REINCUBATED FOR BETTER GROWTH Performed at [facility], [address], [place], [place] [address]    Report Status PENDING  Incomplete  Blood culture (routine x 2)     Status: None (Preliminary result)   Collection Time: 02/11/24 10:28 AM   Specimen: BLOOD LEFT ARM  Result Value Ref Range Status   Specimen Description   Final    BLOOD LEFT ARM Performed at [facility], [address] [place], [place] [address]    Special Requests   Final    BOTTLES DRAWN AEROBIC AND ANAEROBIC Blood Culture results may not be optimal due to an inadequate volume of blood received in culture bottles Performed  at [facility], [address] [place], [place] [address]    Culture   Final    NO GROWTH 2 DAYS Performed at [facility], [address], [place], [place] [address]    Report Status PENDING  Incomplete         Radiology Studies: DG Chest Port 1 View Result Date: 02/11/2024 EXAM: 1 VIEW(S) XRAY OF THE CHEST 02/11/2024 12:45:00 PM COMPARISON: Chest x-ray dated 04/23/2021 CLINICAL HISTORY: Cough. FINDINGS: LUNGS AND PLEURA: Hypoventilatory changes. No focal pulmonary opacity. No pulmonary edema. No pleural effusion. No pneumothorax. HEART AND MEDIASTINUM: Borderline cardiac enlargement. BONES AND SOFT TISSUES: No acute osseous abnormality. IMPRESSION: 1. No acute cardiopulmonary process. 2. Borderline cardiomegaly. Electronically signed by: Luke Bun MD 02/11/2024 03:18 PM EDT RP Workstation: [id number]        Scheduled Meds:  Chlorhexidine  Gluconate Cloth  6 each Topical Daily   ferrous sulfate   325 mg Oral TID WC   influenza vac split trivalent PF  0.5 mL Intramuscular Tomorrow-1000   insulin  aspart  0-9 Units Subcutaneous Q6H   insulin  glargine-yfgn  20 Units Subcutaneous BID   mupirocin ointment  1 Application Nasal BID   Continuous Infusions:  cefTRIAXone  (ROCEPHIN )  IV 200 mL/hr at 02/12/24 1742   linezolid (ZYVOX) IV 600 mg (02/12/24 2113)     LOS: 2 days   Time spent:  Elsie JAYSON [place], DO [facility]  If 7PM-7AM, please contact night-coverage [email or web address]  02/13/2024, 7:52 AM

## 2024-02-13 NOTE — Progress Notes (Signed)
 2 Days Post-Op  Subjective: CC: R thigh pain improved. Seen with RN. Afebrile. Tachycardia resolved. No hypotension. WBC downtrending at 17 from 17.8.   Objective: Vital signs in last 24 hours: Temp:  [98 F (36.7 C)-99.9 F (37.7 C)] 98.7 F (37.1 C) (10/21 0603) Pulse Rate:  [93-100] 95 (10/21 0603) Resp:  [20] 20 (10/21 0603) BP: (125-153)/(71-79) 153/71 (10/21 0603) SpO2:  [94 %-96 %] 96 % (10/21 0603) Weight:  [124.3 kg] 124.3 kg (10/21 0500) Last BM Date : 02/10/24  Intake/Output from previous day: 10/20 0701 - 10/21 0700 In: 1600.1 [P.O.:900; IV Piggyback:700.1] Out: -  Intake/Output this shift: No intake/output data recorded.  PE: Gen:  Alert, NAD, pleasant R thigh: Seen with RN, Elspeth Lands. R thigh wound as noted below. Wounds x 2 connected via penrose. Superior incision w/ some cauterized vs necrotic tissue mixed w/ fibropurulent drainage as noted in the picture. No active drainage. Inferior wound clean without drainage. There is surrounding erythema and induration extending down the thigh that is overall stable from yesterday. There is some soft tissue between the 2 wound openings.         Lab Results:  Recent Labs    02/12/24 0444 02/13/24 0504  WBC 17.8* 17.0*  HGB 7.4* 7.6*  HCT 23.1* 24.1*  PLT 387 454*   BMET Recent Labs    02/12/24 0444 02/13/24 0504  NA 129* 130*  K 3.7 3.8  CL 96* 96*  CO2 21* 23  GLUCOSE 226* 218*  BUN 22* 17  CREATININE 0.88 0.75  CALCIUM  8.4* 8.7*   PT/INR Recent Labs    02/11/24 1028  LABPROT 15.1  INR 1.1   CMP     Component Value Date/Time   NA 130 (L) 02/13/2024 0504   NA 133 (L) 09/12/2023 1553   K 3.8 02/13/2024 0504   CL 96 (L) 02/13/2024 0504   CO2 23 02/13/2024 0504   GLUCOSE 218 (H) 02/13/2024 0504   BUN 17 02/13/2024 0504   BUN 14 09/12/2023 1553   CREATININE 0.75 02/13/2024 0504   CALCIUM  8.7 (L) 02/13/2024 0504   PROT 6.4 (L) 02/11/2024 1028   PROT 6.9 09/12/2023 1553    ALBUMIN 2.8 (L) 02/11/2024 1028   ALBUMIN 3.8 (L) 09/12/2023 1553   AST 28 02/11/2024 1028   ALT 20 02/11/2024 1028   ALKPHOS 250 (H) 02/11/2024 1028   BILITOT 0.3 02/11/2024 1028   BILITOT <0.2 09/12/2023 1553   GFRNONAA >60 02/13/2024 0504   GFRAA >60 01/19/2020 2119   Lipase     Component Value Date/Time   LIPASE 21 07/15/2022 1816    Studies/Results: DG Chest Port 1 View Result Date: 02/11/2024 EXAM: 1 VIEW(S) XRAY OF THE CHEST 02/11/2024 12:45:00 PM COMPARISON: Chest x-ray dated 04/23/2021 CLINICAL HISTORY: Cough. FINDINGS: LUNGS AND PLEURA: Hypoventilatory changes. No focal pulmonary opacity. No pulmonary edema. No pleural effusion. No pneumothorax. HEART AND MEDIASTINUM: Borderline cardiac enlargement. BONES AND SOFT TISSUES: No acute osseous abnormality. IMPRESSION: 1. No acute cardiopulmonary process. 2. Borderline cardiomegaly. Electronically signed by: Luke Bun MD 02/11/2024 03:18 PM EDT RP Workstation: [id number]    Anti-infectives: Anti-infectives (From admission, onward)    Start     Dose/Rate Route Frequency Ordered Stop   02/12/24 1600  cefTRIAXone  (ROCEPHIN ) 2 g in sodium chloride  0.9 % 100 mL IVPB        2 g 200 mL/hr over 30 Minutes Intravenous Every 24 hours 02/12/24 1450     02/11/24 1600  cefTRIAXone  (  ROCEPHIN ) 1 g in sodium chloride  0.9 % 100 mL IVPB  Status:  Discontinued        1 g 200 mL/hr over 30 Minutes Intravenous Every 24 hours 02/11/24 0127 02/12/24 1450   02/11/24 1200  linezolid (ZYVOX) IVPB 600 mg        600 mg 300 mL/hr over 60 Minutes Intravenous Every 12 hours 02/11/24 0127     02/10/24 2245  cefTRIAXone  (ROCEPHIN ) 2 g in sodium chloride  0.9 % 100 mL IVPB        2 g 200 mL/hr over 30 Minutes Intravenous Once 02/10/24 2231 02/10/24 2322   02/10/24 2245  vancomycin  (VANCOCIN ) IVPB 1000 mg/200 mL premix        1,000 mg 200 mL/hr over 60 Minutes Intravenous  Once 02/10/24 2231 02/11/24 0131        Assessment/Plan POD 2 s/p incision  and drainage right thigh abscess by Dr. Ebbie on 02/11/24 - No evidence of NSTI intra-op. Monitor.  - Suspect she would benefit from having the two wounds opened up and connected in the OR as well as to explore the superior wound further to ensure it does not need any additional debridement. Discussed with my attending who agree's. She would like to hold off on this. Reasonable as I do not think this is emergent but will make her NPO for possible OR in the morning if things are not improving.  - Packing out, okay to leave out - Cont penrose drain - Cont abx. Cx pending. Gram stain with GPC and GNR  FEN - CM. NPO at midnight.  VTE - SCDs, okay for chem ppx from a general surgery standpoint ID - Rocephin /Linezolid    LOS: 2 days    Evelyn Watts, [facility] Surgery 02/13/2024, 9:37 AM Please see Amion for pager number during day hours 7:00am-4:30pm

## 2024-02-13 NOTE — Progress Notes (Deleted)
 02/13/2024 Name: Evelyn Watts MRN: [id number] DOB: October 12, 1975  No chief complaint on file.   Evelyn Watts is a 48 y.o. year old female who presented for a telephone visit.   They were referred to the pharmacist by their PCP for assistance in managing diabetes, hypertension, and hyperlipidemia.   Subjective: At last pharmacy telephone appt on 10/09/23, patient reported that she started Ozempic  and stopped Victoza . However, she had run out of Ozempic  ~2 weeks ago. She was instructed to increase Ozempic  to 0.5 mg weekly. She was dispensed a 3 mo supply of FL3+ sensors on 10/09/23 from [facility] pharmacy. She was seen by her PCP, Bascom Borer, NP on 11/09/23. A1C had decreased slightly from 12.8% to 11.6%. Her BP was controlled at 121/74 mmHg.   Today, patient reports doing well. She has been out of her Ozempic  for ~2 weeks. Reports she does not currently have a ride for the pharmacy, so would prefer to receive a 3 mo supply in the mail. She denies missed doses of her other medications. She reports that she was tolerating Ozempic  0.5 mg well without any GI issues (nausea, vomiting, constipation, diarrhea, abdominal pain).   Care Team: Primary Care Provider: Borer Bascom RAMAN, NP ; Next Scheduled Visit: 02/09/24  Medication Access/Adherence  Current Pharmacy:  [id number] [place], [place] - [address] AT [address] [place] [address] Phone: [phone] Fax: [phone]  [facility] - [address], [place] - [address] [place] [address] Phone: [phone] Fax: [phone]  [facility] [address] [place] [address] Phone: [phone] Fax: [phone]   Patient reports affordability concerns with their medications: No  - copays usually $0  Patient reports access/transportation concerns to their pharmacy: Yes  - Still using multiple pharmacies: [facility] (most maintenance meds), [facility] on [address] (Ozempic ), [facility] mail order (FL3+) - Patient reports that she continues to have difficulty obtaining transportation for her appointments - working with LCSW - She never received FL3+ CGM from [facility] pharmacy (mailed out 10/09/23) - thinks someone likely stole the package from her porch  Patient reports adherence concerns with their medications:  - Yes - she has an in-home aid that comes to help her with medications and other ADLs every day except Sundays.  - She reports that she ran out of Ozempic  about 2 weeks ago. She confirms she was taking 0.5 mg prior to running out.   Diabetes:  Current medications: Lantus  30 units twice daily, Ozempic  0.5 mg weekly (Mondays), metformin  XR 500 mg - 2 tablets in the morning and 1 tablet at night Medications tried in the past: dulaglutide (upset stomach, nausea), Victoza  (inadequate response)  She denies GI AE since switching from Victoza  to Ozempic . Denies nausea, vomiting, diarrhea, abdominal pain.   Using glucometer; testing in the morning, after meals, and at bedtime  This AM (before eating) - 120 mg/dL. Does not recall any additional numbers. Unable to access her glucometer for more detailed review.   Patient did not receive FL3+ via mail order. Would have to pursue insurance override for refill before Sept 2025.   Patient denies hypoglycemic s/sx including dizziness, shakiness, sweating. Patient denies hyperglycemic symptoms including polyuria, polydipsia, polyphagia, nocturia, neuropathy, blurred vision.  Current meal patterns: 3 meals/day - reports she is mainly baking and boiling her foods. Eating more vegetables - cabbage, stir fry.  - Breakfast: sausage, eggs, grits, oatmeal - Lunch:  hamburger with no bread, just lettuce, fruit - Supper: chicken, small portion of rice, greens - Snacks: handful of chips occasionally - Drinks: glass of water  with every meal  Current physical activity: walking in  the mornings almost every day for 25-30 minutes with aid. Limited by pain in feet with bone spurs. She asks about whether DME orders were placed for rollator with chair.  Hypertension:  Current medications: lisinopril  20 mg daily (current supply per fill hx)  Hyperlipidemia/ASCVD Risk Reduction  Current lipid lowering medications: rosuvastatin  20 mg daily (current supply per fill hx)  The 10-year ASCVD risk score (Arnett DK, et al., 2019) is: 4.3%   Values used to calculate the score:     Age: 3 years     Clincally relevant sex: Female     Is Non-Hispanic African American: No     Diabetic: Yes     Tobacco smoker: No     Systolic Blood Pressure: 153 mmHg     Is BP treated: No     HDL Cholesterol: 30 mg/dL     Total Cholesterol: 163 mg/dL   Objective:  BP Readings from Last 3 Encounters:  02/13/24 (!) 153/71  11/09/23 121/74  09/12/23 132/82    Lab Results  Component Value Date   HGBA1C 12.5 (H) 02/11/2024   HGBA1C 11.6 (A) 11/09/2023   HGBA1C 12.8 (A) 06/08/2023    Lab Results  Component Value Date   CREATININE 0.75 02/13/2024   BUN 17 02/13/2024   NA 130 (L) 02/13/2024   K 3.8 02/13/2024   CL 96 (L) 02/13/2024   CO2 23 02/13/2024    Lab Results  Component Value Date   CHOL 163 09/12/2023   HDL 30 (L) 09/12/2023   LDLCALC 73 09/12/2023   LDLDIRECT 79 09/12/2023   TRIG 380 (H) 09/12/2023   CHOLHDL 5.4 (H) 09/12/2023    Medications Reviewed Today   Medications were not reviewed in this encounter    Audit from Current Admission     Reviewed by Para Jerelene CROME, CRNA (Certified Registered Nurse Anesthetist) on 02/11/24 at 0715  Med List Status: Complete   Medication Order Taking? Sig Documenting Provider Last Dose Status Informant  acetaminophen  (TYLENOL ) suppository 650 mg [id number]   Howerter, Justin B, DO  Active   acetaminophen  (TYLENOL ) tablet 650 mg [id number]   Howerter, Justin B, DO  Active   cefTRIAXone  (ROCEPHIN ) 1 g in sodium chloride   0.9 % 100 mL IVPB [id number]   Howerter, Justin B, DO  Active   cefTRIAXone  (ROCEPHIN ) 2 g in sodium chloride  0.9 % 100 mL IVPB [id number]   Logan Ubaldo NOVAK, PA-C  Expired 02/10/24 2322   vancomycin  (VANCOCIN ) IVPB 1000 mg/200 mL premix [id number]   Logan Ubaldo NOVAK, PA-C  Expired 02/11/24 0131          Reviewed by Emmitt CzechTHEORA SAUNDERS, CPhT (Pharmacy Technician) on 02/11/24 at 0212  Med List Status: Complete   Medication Order Taking? Sig Documenting Provider Last Dose Status Informant  Accu-Chek Softclix Lancets lancets [id number] Yes TEST BLOOD SUGAR AS DIRECTED Oley Bascom RAMAN, NP Unknown Active Self, Pharmacy Records  albuterol  (VENTOLIN  HFA) 108 (90 Base) MCG/ACT inhaler [id number] Yes Inhale 2 puffs into the lungs every 4 (four) hours as needed for wheezing. Oley Bascom RAMAN, NP Unknown Active Self, Pharmacy Records  cetirizine  (ZYRTEC ) 10 MG tablet [id number] Yes Take 1 tablet (10 mg total) by mouth daily. Oley Bascom RAMAN, NP 02/10/2024 Morning Active Self, Pharmacy Records  Continuous Glucose Sensor (  FREESTYLE LIBRE 3 PLUS SENSOR) MISC [id number] Yes Change sensor every 15 days. Use to check glucose continuously using the [employer] 3 app. Oley Bascom RAMAN, NP Past Week Active Self, Pharmacy Records  cyclobenzaprine  (FLEXERIL ) 5 MG tablet [id number] Yes Take 1 tablet (5 mg total) by mouth 3 (three) times daily as needed for muscle spasms. Paseda, Folashade R, FNP Unknown Active Self, Pharmacy Records  Ferrous Sulfate  90 (18 Fe) MG TABS [id number] Yes Take 1 tablet by mouth daily. Oley Bascom RAMAN, NP Unknown Active Self, Pharmacy Records  gabapentin  (NEURONTIN ) 300 MG capsule [id number] Yes TAKE 1 CAPSULE THREE TIMES DAILY Oley Bascom RAMAN, NP 02/10/2024 Evening Active Self, Pharmacy Records  glucose blood (ACCU-CHEK GUIDE TEST) test strip [id number] Yes TEST BLOOD SUGAR AS DIRECTED Oley Bascom RAMAN, NP Unknown Active Self, Pharmacy Records  IBU 600 MG tablet [id number] Yes TAKE 1 TABLET  EVERY 8 HOURS AS NEEDED Oley Bascom RAMAN, NP Unknown Active Self, Pharmacy Records  insulin  glargine (LANTUS ) 100 UNIT/ML Solostar Pen [id number] Yes Inject 30 Units into the skin 2 (two) times daily. Oley Bascom RAMAN, NP 02/10/2024 Evening Active Self, Pharmacy Records  Insulin  Pen Needle (PEN NEEDLES) 32G X 4 MM MISC [id number] Yes Use as directed to inject insulin  twice daily. Oley Bascom RAMAN, NP Unknown Active Self, Pharmacy Records  lisinopril  (ZESTRIL ) 20 MG tablet [id number] Yes Take 1 tablet (20 mg total) by mouth daily. Oley Bascom RAMAN, NP 02/10/2024 Morning Active Self, Pharmacy Records  metFORMIN  (GLUCOPHAGE -XR) 500 MG 24 hr tablet [id number] Yes Take 2 tablets (1,000 mg total) by mouth 2 (two) times daily with a meal. Oley Bascom RAMAN, NP 02/10/2024 Evening Active Self, Pharmacy Records  naproxen  (NAPROSYN ) 500 MG tablet [id number] Yes TAKE 1 TABLET TWICE DAILY WITH MEALS Nichols, Tonya S, NP Past Week Active Self, Pharmacy Records  Nutritional Supplements (GLUCERNA 1.0 CAL) LIQD [id number] Yes Take 1 Bottle by mouth 3 (three) times daily as needed. Oley Bascom RAMAN, NP Unknown Active Self, Pharmacy Records  rosuvastatin  (CRESTOR ) 40 MG tablet [id number] Yes Take 1 tablet (40 mg total) by mouth daily. Oley Bascom RAMAN, NP 02/09/2024 Active Self, Pharmacy Records  Semaglutide , 1 MG/DOSE, 4 MG/3ML SOPN [id number] Yes Inject 1 mg into the skin once a week. Oley Bascom RAMAN, NP Unknown Active Self, Pharmacy Records              Assessment/Plan:   Diabetes: - Currently uncontrolled with last A1c 11.6% above goal < 7%, but improved from 12.8% on 06/08/23. Patient would benefit from CGM monitoring, but unfortunately her shipment for the FL3+ sensors was lost. She is tolerating Ozempic  0.5 mg weekly well, so will plan to increase to 1 mg weekly at next fill. Will provide with 3 mo supply via mail order to promote adherence.  - Reviewed long term cardiovascular and renal outcomes of uncontrolled  blood sugar - Reviewed goal A1c, goal fasting, and goal 2 hour post prandial glucose - Reviewed dietary modifications including drinking more water , focusing on intake of protein and nonstarchy vegetables - Reviewed lifestyle modifications including: increasing physical activity. Commended for daily walking.  - Recommend to increase Ozempic  to 1 mg once weekly. Will collaborate with PCP to place orders. - Recommend to continue Lantus  30 units BID. Can consider switching to concentrated insulin  in the future to reduce injection burden.  - Recommend to continue metformin  XR 1000 mg in the AM and 500 mg in the PM - Recommend to check glucose twice daily with glucometer. Will re-attempt  to access CGM at follow-up.  - A1C due Oct 2025   Hypertension: - Currently controlled with last office BP 121/74 mmHg controlled belwo goal <130/80. Patient has adequate supply of ACEi.  - Continue lisinopril  20 mg daily   Hyperlipidemia/ASCVD Risk Reduction: - Currently uncontrolled with last LDL 73 mg/dL, above goal <29 mg/dL but improved from 889 mg/dL. TG elevated to 380 mg/dL, above goal < 849 mg/dL, but improved from 543 mg/dL. Adherence appears appropriate. Patient may benefit from addition of ezetimibe 10 mg daily, but hesitant to increase pill burden given existing issues with adherence and confusion at the pharmacy.  - Continue rosuvastatin  20 mg daily   Follow Up Plan:  Pharmacist telephone 12/27/23, PCP 02/09/24  Lorain Baseman, PharmD PGY1 Pharmacy Resident

## 2024-02-13 NOTE — Inpatient Diabetes Management (Signed)
 Inpatient Diabetes Program Recommendations  AACE/ADA: New Consensus Statement on Inpatient Glycemic Control (2015)  Target Ranges:  Prepandial:   less than 140 mg/dL      Peak postprandial:   less than 180 mg/dL (1-2 hours)      Critically ill patients:  140 - 180 mg/dL   Lab Results  Component Value Date   GLUCAP 216 (H) 02/13/2024   HGBA1C 12.5 (H) 02/11/2024    Review of Glycemic Control  Diabetes history: DM2 Outpatient Diabetes medications: Lantus  30 BID, metformin  1000 mg BID, Ozempic  1 mg weekly Current orders for Inpatient glycemic control: Semglee  20 BID, Novolog  0-9 Q6H  Inpatient Diabetes Program Recommendations:    Consider increasing Semglee  to 22 units BID  Consider adding Novolog  4 units TID with meals if eating > 50%.  Spoke with pt and husband regarding her diabetes control and HgbA1C of 12.5%. Discussed adding rapid-acting insulin  for home. Needs tighter control for healing. Discussed glucose and A1C goals. Discussed importance of checking CBGs and maintaining good CBG control to prevent long-term and short-term complications. Explained how hyperglycemia leads to damage within blood vessels which lead to the common complications seen with uncontrolled diabetes. Stressed to the patient the importance of improving glycemic control to prevent further complications from uncontrolled diabetes. Discussed impact of nutrition, exercise, stress, sickness, and medications on diabetes control. Will need to f/u with PCP after hospitalization. Pt verbalized understanding of information discussed. May have to go back to OR in the morning. Will be NPO after MN.  Thank you. Shona Brandy, RD, LDN, CDCES Inpatient Diabetes Coordinator [phone]

## 2024-02-14 ENCOUNTER — Inpatient Hospital Stay (HOSPITAL_COMMUNITY): Admitting: Anesthesiology

## 2024-02-14 ENCOUNTER — Encounter (HOSPITAL_COMMUNITY): Payer: Self-pay | Admitting: Internal Medicine

## 2024-02-14 ENCOUNTER — Telehealth

## 2024-02-14 ENCOUNTER — Encounter (HOSPITAL_COMMUNITY)
Admission: EM | Disposition: A | Discharge status: Home Health | Payer: Self-pay | Source: Home / Self Care | Attending: Internal Medicine

## 2024-02-14 DIAGNOSIS — I10 Essential (primary) hypertension: Secondary | ICD-10-CM | POA: Diagnosis not present

## 2024-02-14 DIAGNOSIS — Z6836 Body mass index (BMI) 36.0-36.9, adult: Secondary | ICD-10-CM | POA: Diagnosis not present

## 2024-02-14 DIAGNOSIS — L02411 Cutaneous abscess of right axilla: Secondary | ICD-10-CM | POA: Diagnosis not present

## 2024-02-14 DIAGNOSIS — L03115 Cellulitis of right lower limb: Secondary | ICD-10-CM | POA: Diagnosis not present

## 2024-02-14 HISTORY — PX: IRRIGATION AND DEBRIDEMENT ABSCESS: SHX5252

## 2024-02-14 LAB — GLUCOSE, CAPILLARY
Glucose-Capillary: 137 mg/dL — ABNORMAL HIGH (ref 70–99)
Glucose-Capillary: 146 mg/dL — ABNORMAL HIGH (ref 70–99)
Glucose-Capillary: 160 mg/dL — ABNORMAL HIGH (ref 70–99)
Glucose-Capillary: 169 mg/dL — ABNORMAL HIGH (ref 70–99)
Glucose-Capillary: 177 mg/dL — ABNORMAL HIGH (ref 70–99)
Glucose-Capillary: 197 mg/dL — ABNORMAL HIGH (ref 70–99)
Glucose-Capillary: 223 mg/dL — ABNORMAL HIGH (ref 70–99)
Glucose-Capillary: 224 mg/dL — ABNORMAL HIGH (ref 70–99)

## 2024-02-14 LAB — BASIC METABOLIC PANEL WITH GFR
Anion gap: 12 (ref 5–15)
BUN: 12 mg/dL (ref 6–20)
CO2: 23 mmol/L (ref 22–32)
Calcium: 8.9 mg/dL (ref 8.9–10.3)
Chloride: 101 mmol/L (ref 98–111)
Creatinine, Ser: 0.61 mg/dL (ref 0.44–1.00)
GFR, Estimated: >60 mL/min (ref >60)
Glucose, Bld: 184 mg/dL — ABNORMAL HIGH (ref 70–99)
Potassium: 3.7 mmol/L (ref 3.5–5.1)
Sodium: 136 mmol/L (ref 135–145)

## 2024-02-14 LAB — CBC
HCT: 24.3 % — ABNORMAL LOW (ref 36.0–46.0)
Hemoglobin: 7.3 g/dL — ABNORMAL LOW (ref 12.0–15.0)
MCH: 23 pg — ABNORMAL LOW (ref 26.0–34.0)
MCHC: 30 g/dL (ref 30.0–36.0)
MCV: 76.7 fL — ABNORMAL LOW (ref 80.0–100.0)
Platelets: 490 K/uL — ABNORMAL HIGH (ref 150–400)
RBC: 3.17 MIL/uL — ABNORMAL LOW (ref 3.87–5.11)
RDW: 16.9 % — ABNORMAL HIGH (ref 11.5–15.5)
WBC: 14.6 K/uL — ABNORMAL HIGH (ref 4.0–10.5)
nRBC: 0.3 % — ABNORMAL HIGH (ref 0.0–0.2)

## 2024-02-14 SURGERY — IRRIGATION AND DEBRIDEMENT ABSCESS
Anesthesia: General | Site: Thigh | Laterality: Right

## 2024-02-14 MED ORDER — PROPOFOL 10 MG/ML IV BOLUS
INTRAVENOUS | Status: DC | PRN
  Administered 2024-02-14: 200 mg via INTRAVENOUS

## 2024-02-14 MED ORDER — BUPIVACAINE-EPINEPHRINE 0.25% -1:200000 IJ SOLN: INTRAMUSCULAR | Status: DC | PRN

## 2024-02-14 MED ORDER — MIDAZOLAM HCL 2 MG/2ML IJ SOLN
INTRAMUSCULAR | Status: AC
  Filled 2024-02-14: qty 2

## 2024-02-14 MED ORDER — LIDOCAINE HCL (CARDIAC) PF 100 MG/5ML IV SOSY
PREFILLED_SYRINGE | INTRAVENOUS | Status: DC | PRN
  Administered 2024-02-14: 100 mg via INTRATRACHEAL

## 2024-02-14 MED ORDER — ONDANSETRON HCL 4 MG/2ML IJ SOLN
INTRAMUSCULAR | Status: DC | PRN
  Administered 2024-02-14: 4 mg via INTRAVENOUS

## 2024-02-14 MED ORDER — SUGAMMADEX SODIUM 200 MG/2ML IV SOLN
INTRAVENOUS | Status: AC
  Filled 2024-02-14: qty 2

## 2024-02-14 MED ORDER — DEXMEDETOMIDINE HCL IN NACL 80 MCG/20ML IV SOLN
INTRAVENOUS | Status: DC | PRN
Start: 2024-02-14 — End: 2024-02-14
  Administered 2024-02-14 (×3): 4 ug via INTRAVENOUS

## 2024-02-14 MED ORDER — DEXTROSE 5 % IV SOLN
INTRAVENOUS | Status: DC | PRN
  Administered 2024-02-14: 2 g via INTRAVENOUS

## 2024-02-14 MED ORDER — CHLORHEXIDINE GLUCONATE 0.12 % MT SOLN
15.0000 mL | Freq: Once | OROMUCOSAL | Status: AC
  Administered 2024-02-14: 15 mL via OROMUCOSAL

## 2024-02-14 MED ORDER — ONDANSETRON HCL 4 MG/2ML IJ SOLN: 4.0000 mg | Freq: Once | INTRAMUSCULAR | Status: DC | PRN

## 2024-02-14 MED ORDER — KETOROLAC TROMETHAMINE 30 MG/ML IJ SOLN: 30.0000 mg | Freq: Once | INTRAMUSCULAR | Status: DC | PRN

## 2024-02-14 MED ORDER — FENTANYL CITRATE (PF) 100 MCG/2ML IJ SOLN
INTRAMUSCULAR | Status: DC | PRN
  Administered 2024-02-14 (×3): 50 ug via INTRAVENOUS

## 2024-02-14 MED ORDER — ROCURONIUM BROMIDE 10 MG/ML (PF) SYRINGE
PREFILLED_SYRINGE | INTRAVENOUS | Status: DC | PRN
  Administered 2024-02-14: 60 mg via INTRAVENOUS

## 2024-02-14 MED ORDER — OXYCODONE HCL 5 MG/5ML PO SOLN: 5.0000 mg | Freq: Once | ORAL | Status: DC | PRN

## 2024-02-14 MED ORDER — HYDROMORPHONE HCL 1 MG/ML IJ SOLN
0.2500 mg | INTRAMUSCULAR | Status: DC | PRN
  Administered 2024-02-14 (×2): 0.5 mg via INTRAVENOUS

## 2024-02-14 MED ORDER — LACTATED RINGERS IV SOLN: INTRAVENOUS | Status: DC | PRN

## 2024-02-14 MED ORDER — BUPIVACAINE-EPINEPHRINE (PF) 0.25% -1:200000 IJ SOLN
INTRAMUSCULAR | Status: AC
  Filled 2024-02-14: qty 30

## 2024-02-14 MED ORDER — SUGAMMADEX SODIUM 200 MG/2ML IV SOLN
INTRAVENOUS | Status: DC | PRN
  Administered 2024-02-14: 468 mg via INTRAVENOUS

## 2024-02-14 MED ORDER — HYDROGEN PEROXIDE 3 % EX SOLN
CUTANEOUS | Status: AC
  Filled 2024-02-14: qty 473

## 2024-02-14 MED ORDER — OXYCODONE HCL 5 MG PO TABS: 5.0000 mg | ORAL_TABLET | Freq: Once | ORAL | Status: DC | PRN

## 2024-02-14 MED ORDER — FENTANYL CITRATE (PF) 100 MCG/2ML IJ SOLN
INTRAMUSCULAR | Status: AC
  Filled 2024-02-14: qty 2

## 2024-02-14 MED ORDER — MIDAZOLAM HCL 5 MG/5ML IJ SOLN
INTRAMUSCULAR | Status: DC | PRN
  Administered 2024-02-14: 2 mg via INTRAVENOUS

## 2024-02-14 MED ORDER — LACTATED RINGERS IV SOLN: INTRAVENOUS | Status: DC

## 2024-02-14 MED ORDER — HYDROMORPHONE HCL 1 MG/ML IJ SOLN
INTRAMUSCULAR | Status: AC
  Filled 2024-02-14: qty 1

## 2024-02-14 MED ORDER — ONDANSETRON HCL 4 MG/2ML IJ SOLN
INTRAMUSCULAR | Status: AC
  Filled 2024-02-14: qty 2

## 2024-02-14 MED ORDER — 0.9 % SODIUM CHLORIDE (POUR BTL) OPTIME
TOPICAL | Status: DC | PRN
  Administered 2024-02-14: 1000 mL

## 2024-02-14 MED ORDER — SUGAMMADEX SODIUM 200 MG/2ML IV SOLN
INTRAVENOUS | Status: AC
  Filled 2024-02-14: qty 4

## 2024-02-14 MED ORDER — INSULIN ASPART 100 UNIT/ML IJ SOLN
0.0000 [IU] | INTRAMUSCULAR | Status: DC | PRN
  Administered 2024-02-14: 2 [IU] via SUBCUTANEOUS
  Filled 2024-02-14: qty 1

## 2024-02-14 SURGICAL SUPPLY — 34 items
BAG COUNTER SPONGE SURGICOUNT (BAG) IMPLANT
BNDG GAUZE DERMACEA FLUFF 4 (GAUZE/BANDAGES/DRESSINGS) IMPLANT
COVER SURGICAL LIGHT HANDLE (MISCELLANEOUS) ×1 IMPLANT
DERMABOND ADVANCED .7 DNX12 (GAUZE/BANDAGES/DRESSINGS) IMPLANT
DRAPE LAPAROSCOPIC ABDOMINAL (DRAPES) IMPLANT
DRAPE LAPAROTOMY T 102X78X121 (DRAPES) IMPLANT
DRAPE LAPAROTOMY T 98X78 PEDS (DRAPES) IMPLANT
DRAPE LAPAROTOMY TRNSV 102X78 (DRAPES) IMPLANT
DRAPE SHEET LG 3/4 BI-LAMINATE (DRAPES) IMPLANT
DRAPE UTILITY XL STRL (DRAPES) ×1 IMPLANT
ELECT REM PT RETURN 15FT ADLT (MISCELLANEOUS) ×1 IMPLANT
ELECTRODE CAUTERY BLDE TIP 2.5 (TIP) IMPLANT
GAUZE PACKING IODOFORM 1/4X15 (PACKING) IMPLANT
GAUZE PAD ABD 8X10 STRL (GAUZE/BANDAGES/DRESSINGS) IMPLANT
GAUZE SPONGE 4X4 12PLY STRL (GAUZE/BANDAGES/DRESSINGS) ×1 IMPLANT
GLOVE BIO SURGEON STRL SZ7.5 (GLOVE) ×1 IMPLANT
GLOVE INDICATOR 8.0 STRL GRN (GLOVE) ×1 IMPLANT
GOWN STRL REUS W/ TWL XL LVL3 (GOWN DISPOSABLE) ×1 IMPLANT
KIT BASIN OR (CUSTOM PROCEDURE TRAY) ×1 IMPLANT
KIT TURNOVER KIT A (KITS) ×1 IMPLANT
MARKER SKIN DUAL TIP RULER LAB (MISCELLANEOUS) IMPLANT
NDL HYPO 25X1 1.5 SAFETY (NEEDLE) ×1 IMPLANT
NEEDLE HYPO 25X1 1.5 SAFETY (NEEDLE) ×1 IMPLANT
PACK GENERAL/GYN (CUSTOM PROCEDURE TRAY) ×1 IMPLANT
SET HNDPC FAN SPRY TIP SCT (DISPOSABLE) IMPLANT
SPIKE FLUID TRANSFER (MISCELLANEOUS) IMPLANT
SPONGE T-LAP 4X18 ~~LOC~~+RFID (SPONGE) IMPLANT
STAPLER SKIN PROX 35W (STAPLE) IMPLANT
SUT MNCRL AB 4-0 PS2 18 (SUTURE) IMPLANT
SUT VIC AB 3-0 SH 18 (SUTURE) IMPLANT
SWAB COLLECTION DEVICE MRSA (MISCELLANEOUS) IMPLANT
SWAB CULTURE ESWAB REG 1ML (MISCELLANEOUS) IMPLANT
SYR CONTROL 10ML LL (SYRINGE) ×1 IMPLANT
TOWEL OR 17X26 10 PK STRL BLUE (TOWEL DISPOSABLE) ×1 IMPLANT

## 2024-02-14 NOTE — Op Note (Signed)
   Patient: Evelyn Watts (05-Jan-1976, [id number])  Date of Surgery: 02/14/2024  Preoperative Diagnosis: RIGHT THIGH ABSCESS   Postoperative Diagnosis: RIGHT THIGH ABSCESS   Surgical Procedure:  Excisional debridement of 20 cm wide by 8 cm tall by 6 cm deep right thigh wound  Operative Team Members:  Surgeons and Role:    * Necia Kamm, Deward PARAS, MD - Primary   Anesthesiologist: Peggye Delon Brunswick, MD CRNA: Obadiah Reyes BROCKS, CRNA   Anesthesia: General   Fluids:  Total I/O In: 29.4 [I.V.:25.3; IV Piggyback:4.1] Out: -   Complications: None  Drains:  none   Specimen:  ID Type Source Tests Collected by Time Destination  A : Right Thigh Abscess Tissue Culture Tissue Path Tissue AEROBIC/ANAEROBIC CULTURE W GRAM STAIN (SURGICAL/DEEP WOUND) Raywood Wailes, Deward PARAS, MD 02/14/2024 1703      Disposition:  PACU - hemodynamically stable.  Plan of Care: Continue inpatient care    Indications for Procedure: Evelyn Watts is a 48 y.o. female who presented with a right thigh wound.  This was debrided over the weekend and a penrose drain placed.  The wound was not healing well so we recommended repeat excisional debridement.  The procedure itself as well as its risks, benefits and alternatives were discussed.  The risks discussed included but were not limited to the risk of infection, bleeding, damage to nearby structures, and recurrent infection.  After a full discussion and all questions answered the patient granted consent to proceed.  Findings: Purulent drainage and necrotic tissue Wound measurement 8 cm tall by 20 cm wide by 6 cm deep   Description of Procedure:   On the date stated above, after anesthesia and frog leg positioning, the patient's right thigh was prepped with betadine and the penrose drain removed.  The incision and counter incision were connected by sharply excising the bridge of skin between the two using a scalpel and electrocautery.  The wound was  explored.  Purulent drainage and necrotic tissue was encountered.  Some necrotic tissue was sent as a tissue culture. All necrotic tissue including skin and subcutaneous tissues were sharply excised using scalpel and elecrocautery.  The final wound dimensions were 20 cm wide by 8 cm tall by 6 cm deep.  Hemostasis was obtained with electrocautery. The wound was irrigated with hydrogen peroxide, then saline, then packed with three kerlex rolls and a sterile bandage was applied.  Patient tolerated the procedure well and was transferred to PACU.  At the end of the case we reviewed the infection status of the case. Patient: Evelyn Watts Emergency General Surgery Service Patient Case: Urgent Infection Present At Time Of Surgery (PATOS): Dirty wound  Deward Foy, MD General, Bariatric, & Minimally Invasive Surgery [facility], [place]

## 2024-02-14 NOTE — Anesthesia Postprocedure Evaluation (Signed)
 Anesthesia Post Note  Patient: Keyairra Kolinski  Procedure(s) Performed: IRRIGATION AND DEBRIDEMENT ABSCESS (Right: Thigh)     Patient location during evaluation: PACU Anesthesia Type: General Level of consciousness: awake Pain management: pain level controlled Vital Signs Assessment: post-procedure vital signs reviewed and stable Respiratory status: spontaneous breathing, nonlabored ventilation and respiratory function stable Cardiovascular status: blood pressure returned to baseline and stable Postop Assessment: no apparent nausea or vomiting Anesthetic complications: no   No notable events documented.  Last Vitals:  Vitals:   02/14/24 1815 02/14/24 1830  BP: (!) 157/79 (!) 146/81  Pulse: 83 83  Resp: 18 18  Temp:    SpO2: 99% 99%    Last Pain:  Vitals:   02/14/24 1830  TempSrc:   PainSc: Asleep    LLE Motor Response: Purposeful movement (02/14/24 1830)   RLE Motor Response: Purposeful movement (02/14/24 1830)        Delon Aisha Arch

## 2024-02-14 NOTE — Anesthesia Procedure Notes (Signed)
 Procedure Name: Intubation Date/Time: 02/14/2024 4:42 PM  Performed by: Obadiah Reyes BROCKS, CRNAPre-anesthesia Checklist: Patient identified, Emergency Drugs available, Suction available and Patient being monitored Patient Re-evaluated:Patient Re-evaluated prior to induction Oxygen  Delivery Method: Circle System Utilized Preoxygenation: Pre-oxygenation with 100% oxygen  Induction Type: IV induction Ventilation: Mask ventilation without difficulty Laryngoscope Size: Miller and 2 Grade View: Grade I Tube type: Oral Number of attempts: 1 Airway Equipment and Method: Stylet and Oral airway Placement Confirmation: ETT inserted through vocal cords under direct vision, positive ETCO2 and breath sounds checked- equal and bilateral Secured at: 21 cm Tube secured with: Tape Dental Injury: Teeth and Oropharynx as per pre-operative assessment

## 2024-02-14 NOTE — Anesthesia Preprocedure Evaluation (Signed)
 Anesthesia Evaluation  Patient identified by MRN, date of birth, ID band Patient awake    Reviewed: Allergy & Precautions, NPO status , Patient's Chart, lab work & pertinent test results  History of Anesthesia Complications Negative for: history of anesthetic complications  Airway Mallampati: II  TM Distance: >3 FB Neck ROM: Full    Dental no notable dental hx. (+) Teeth Intact, Dental Advisory Given   Pulmonary asthma    Pulmonary exam normal breath sounds clear to auscultation       Cardiovascular hypertension, Pt. on medications (-) angina (-) Past MI Normal cardiovascular exam Rhythm:Regular Rate:Normal     Neuro/Psych    GI/Hepatic negative GI ROS, Neg liver ROS,,,  Endo/Other  diabetes, Type 2    Renal/GU Lab Results      Component                Value               Date                      NA                       136                 02/14/2024                CL                       101                 02/14/2024                K                        3.7                 02/14/2024                CO2                      23                  02/14/2024                BUN                      12                  02/14/2024                CREATININE               0.61                02/14/2024                GFRNONAA                 >60                 02/14/2024                CALCIUM                   8.9  02/14/2024                ALBUMIN                  2.8 (L)             02/11/2024                GLUCOSE                  184 (H)             02/14/2024                Musculoskeletal   Abdominal   Peds  Hematology  (+) Blood dyscrasia, anemia Lab Results      Component                Value               Date                      WBC                      14.6 (H)            02/14/2024                HGB                      7.3 (L)             02/14/2024                HCT                       24.3 (L)            02/14/2024                MCV                      76.7 (L)            02/14/2024                PLT                      490 (H)             02/14/2024              Anesthesia Other Findings   Reproductive/Obstetrics                              Anesthesia Physical Anesthesia Plan  ASA: 3  Anesthesia Plan: General   Post-op Pain Management:    Induction: Intravenous  PONV Risk Score and Plan: Treatment may vary due to age or medical condition, Ondansetron , Midazolam and Dexamethasone  Airway Management Planned: Oral ETT  Additional Equipment: None  Intra-op Plan:   Post-operative Plan: Extubation in OR  Informed Consent: I have reviewed the patients History and Physical, chart, labs and discussed the procedure including the risks, benefits and alternatives for the proposed anesthesia with the patient or authorized representative who has indicated his/her understanding and acceptance.     Dental advisory given  Plan Discussed with: CRNA and Surgeon  Anesthesia Plan Comments:  Anesthesia Quick Evaluation

## 2024-02-14 NOTE — Progress Notes (Addendum)
 3 Days Post-Op  Subjective: CC: R thigh pain improved. Seen with RN. Afebrile. No tachycardia or hypotension. WBC 14.6 from 17.   Objective: Vital signs in last 24 hours: Temp:  [98.1 F (36.7 C)-99.6 F (37.6 C)] 98.1 F (36.7 C) (10/22 0622) Pulse Rate:  [85-96] 85 (10/22 0622) Resp:  [18] 18 (10/22 0622) BP: (128-149)/(67-81) 134/81 (10/22 0622) SpO2:  [93 %-96 %] 96 % (10/22 0622) Last BM Date : 02/11/24  Intake/Output from previous day: 10/21 0701 - 10/22 0700 In: 1240 [P.O.:540; IV Piggyback:700] Out: 300 [Urine:300] Intake/Output this shift: No intake/output data recorded.  PE: Gen:  Alert, NAD, pleasant R thigh: Seen with RN, Myla Kleine. R thigh wound stable from picture 10/21. Wounds x 2 connected via penrose. Superior incision w/ some cauterized vs necrotic tissue mixed w/ fibropurulent drainage. Inferior wound clean without drainage. There is surrounding erythema and induration extending down the thigh that is overall stable from yesterday. There is some soft tissue between the 2 wound openings.   Lab Results:  Recent Labs    02/13/24 0504 02/14/24 0542  WBC 17.0* 14.6*  HGB 7.6* 7.3*  HCT 24.1* 24.3*  PLT 454* 490*   BMET Recent Labs    02/13/24 0504 02/14/24 0542  NA 130* 136  K 3.8 3.7  CL 96* 101  CO2 23 23  GLUCOSE 218* 184*  BUN 17 12  CREATININE 0.75 0.61  CALCIUM  8.7* 8.9   PT/INR No results for input(s): LABPROT, INR in the last 72 hours.  CMP     Component Value Date/Time   NA 136 02/14/2024 0542   NA 133 (L) 09/12/2023 1553   K 3.7 02/14/2024 0542   CL 101 02/14/2024 0542   CO2 23 02/14/2024 0542   GLUCOSE 184 (H) 02/14/2024 0542   BUN 12 02/14/2024 0542   BUN 14 09/12/2023 1553   CREATININE 0.61 02/14/2024 0542   CALCIUM  8.9 02/14/2024 0542   PROT 6.4 (L) 02/11/2024 1028   PROT 6.9 09/12/2023 1553   ALBUMIN 2.8 (L) 02/11/2024 1028   ALBUMIN 3.8 (L) 09/12/2023 1553   AST 28 02/11/2024 1028   ALT 20 02/11/2024  1028   ALKPHOS 250 (H) 02/11/2024 1028   BILITOT 0.3 02/11/2024 1028   BILITOT <0.2 09/12/2023 1553   GFRNONAA >60 02/14/2024 0542   GFRAA >60 01/19/2020 2119   Lipase     Component Value Date/Time   LIPASE 21 07/15/2022 1816    Studies/Results: No results found.   Anti-infectives: Anti-infectives (From admission, onward)    Start     Dose/Rate Route Frequency Ordered Stop   02/12/24 1600  cefTRIAXone  (ROCEPHIN ) 2 g in sodium chloride  0.9 % 100 mL IVPB        2 g 200 mL/hr over 30 Minutes Intravenous Every 24 hours 02/12/24 1450     02/11/24 1600  cefTRIAXone  (ROCEPHIN ) 1 g in sodium chloride  0.9 % 100 mL IVPB  Status:  Discontinued        1 g 200 mL/hr over 30 Minutes Intravenous Every 24 hours 02/11/24 0127 02/12/24 1450   02/11/24 1200  linezolid (ZYVOX) IVPB 600 mg        600 mg 300 mL/hr over 60 Minutes Intravenous Every 12 hours 02/11/24 0127     02/10/24 2245  cefTRIAXone  (ROCEPHIN ) 2 g in sodium chloride  0.9 % 100 mL IVPB        2 g 200 mL/hr over 30 Minutes Intravenous Once 02/10/24 2231 02/10/24 2322  02/10/24 2245  vancomycin  (VANCOCIN ) IVPB 1000 mg/200 mL premix        1,000 mg 200 mL/hr over 60 Minutes Intravenous  Once 02/10/24 2231 02/11/24 0131        Assessment/Plan POD 3 s/p incision and drainage right thigh abscess by Dr. Ebbie on 02/11/24 - No evidence of NSTI intra-op. Monitor.  - Packing out, okay to leave out - Cont penrose drain - Suspect she would benefit from having the two wounds opened up and connected in the OR as well as to explore the superior wound further to ensure it does not need any additional debridement. Discussed with my attending who agree's. I have explained the procedure, risks, and aftercare with the patient. Risks include but are not limited to anesthesia (MI, CVA, death, aspiration, prolonged intubation), pain, bleeding, infection, wound problems, injury to surrounding structures and need for additional procedures. We  discussed that she will need twice daily wound care for this at home and will need family to help. She seems to understand and agrees to proceed with surgery.  - Cont abx. Cx with enterococcus faecalis, strep anginosis with sensitives pending.   FEN - NPO for OR. IVF per TR[facility] VTE - SCDs, okay for chem ppx from a general surgery standpoint ID - Rocephin /Linezolid    LOS: 3 days    Evelyn Watts, [facility] 02/14/2024, 10:37 AM Please see Amion for pager number during day hours 7:00am-4:30pm

## 2024-02-14 NOTE — Progress Notes (Signed)
 PROGRESS NOTE    Mairen Wallenstein  FMW:[id number] DOB: 10-Apr-1976 DOA: 02/10/2024 PCP: Oley Bascom RAMAN, NP   Brief Narrative:  Danilyn Cocke is a 48 y.o. female who presents with right thigh pain concerning for infection.  Patient has known medical history significant for type 2 diabetes mellitus, essential hypertension, chronic hyponatremia with baseline sodium range 128-133, anemia of chronic disease with baseline hemoglobin 10-11.  Hospitalist called for admission, general surgery called in consult for right thigh cellulitis with concern for underlying abscess versus necrotizing fasciitis on imaging.  Assessment & Plan:   Principal Problem:   Cellulitis of right lower extremity Active Problems:   DM2 (diabetes mellitus, type 2) (HCC)   Sepsis (HCC)   Acute on chronic anemia   History of essential hypertension   Chronic hyponatremia  Sepsis due to cellulitis of the right medial thigh:  - Patient febrile with leukocytosis and notable infection, CT remarkable for soft tissue edema and possible underlying abscess versus necrotizing fasciitis given regions of questionable gas - General Surgery following, appreciate insight recommendations, successful I&D 02/11/2024 - Plan for repeat evaluation in the OR today 10/22 given ongoing necrotic region of the wound - Continue broad-spectrum antibiotics including ceftriaxone , linezolid -Generally improving, no notable fevers, leukocytosis downtrending and pain controlled  Acute anemia of acute illness on chronic anemia of chronic disease Concurrent iron deficiency anemia - Continue to follow clinically, no indication for transfusion at this time - Hold IV iron given acute illness/contraindications -initiate p.o. iron supplementation - No signs or symptoms of blood loss - Would have patient recheck iron panel outpatient in the next few weeks to months per PCP  Insulin -dependent type 2 diabetes, uncontrolled - A1c 11.6 earlier this year -  Continue to advance diet per surgery, insulin  continues to increase as appropriate 20 unit glargine twice daily plus sliding scale  Essential hypertension - Well-controlled  Hyponatremia, chronic - Baseline around 130 Chronic hyperosmolar hyponatremia Baseline = 130, at baseline now  DVT prophylaxis: SCDs Start: 02/11/24 0124 Code Status:   Code Status: Full Code Family Communication: At bedside  Status is: Inpatient  Dispo: The patient is from: Home              Anticipated d/c is to: Home              Anticipated d/c date is: 48 to 72 hours              Patient currently not medically stable for discharge  Consultants:  General surgery  Procedures:  Right medial thigh I&D 10/19  Antimicrobials:  Linezolid, ceftriaxone   Subjective: No acute issues or events overnight, pain currently well-controlled, denies nausea vomiting headache fever chills or chest pain  Objective: Vitals:   02/13/24 1106 02/13/24 1432 02/13/24 2233 02/14/24 0622  BP: (!) 146/76 (!) 149/81 128/67 134/81  Pulse: 93 96 95 85  Resp:  18 18 18   Temp:  98.3 F (36.8 C) 99.6 F (37.6 C) 98.1 F (36.7 C)  TempSrc:  Oral Oral Oral  SpO2: 94% 95% 93% 96%  Weight:      Height:        Intake/Output Summary (Last 24 hours) at 02/14/2024 0739 Last data filed at 02/14/2024 0030 Gross per 24 hour  Intake 1240 ml  Output 300 ml  Net 940 ml   Filed Weights   02/11/24 0338 02/12/24 0500 02/13/24 0500  Weight: 124 kg 125.7 kg 124.3 kg    Examination:  General:  Pleasantly resting in bed,  No acute distress. HEENT:  Normocephalic atraumatic.  Sclerae nonicteric, noninjected.  Extraocular movements intact bilaterally. Neck:  Without mass or deformity.  Trachea is midline. Lungs:  Clear to auscultate bilaterally without rhonchi, wheeze, or rales. Heart:  Regular rate and rhythm.  Without murmurs, rubs, or gallops. Abdomen:  Soft, nontender, nondistended.  Without guarding or rebound. Extremities:  Without cyanosis, clubbing, edema, or obvious deformity. Skin: Medial right thigh bandage clean dry intact  Data Reviewed: I have personally reviewed following labs and imaging studies  CBC: Recent Labs  Lab 02/10/24 2112 02/11/24 1028 02/12/24 0444 02/13/24 0504 02/14/24 0542  WBC 18.7* 17.7* 17.8* 17.0* 14.6*  NEUTROABS 16.2* 14.4*  --   --   --   HGB 8.5* 7.4* 7.4* 7.6* 7.3*  HCT 26.7* 23.4* 23.1* 24.1* 24.3*  MCV 74.0* 73.6* 75.2* 75.8* 76.7*  PLT 461* 386 387 454* 490*   Basic Metabolic Panel: Recent Labs  Lab 02/10/24 2112 02/11/24 1028 02/12/24 0444 02/13/24 0504 02/14/24 0542  NA 127* 131* 129* 130* 136  K 3.7 3.7 3.7 3.8 3.7  CL 93* 98 96* 96* 101  CO2 21* 22 21* 23 23  GLUCOSE 306* 240* 226* 218* 184*  BUN 22* 22* 22* 17 12  CREATININE 0.90 0.86 0.88 0.75 0.61  CALCIUM  8.9 8.7* 8.4* 8.7* 8.9  MG  --  2.1  --   --   --    GFR: Estimated Creatinine Clearance: 127.9 mL/min (by C-G formula based on SCr of 0.61 mg/dL). Liver Function Tests: Recent Labs  Lab 02/11/24 1028  AST 28  ALT 20  ALKPHOS 250*  BILITOT 0.3  PROT 6.4*  ALBUMIN 2.8*   Coagulation Profile: Recent Labs  Lab 02/11/24 1028  INR 1.1   HbA1C: Recent Labs    02/11/24 1028  HGBA1C 12.5*   CBG: Recent Labs  Lab 02/13/24 0614 02/13/24 1155 02/13/24 1752 02/14/24 0023 02/14/24 0554  GLUCAP 216* 255* 239* 223* 177*   Anemia Panel: Recent Labs    02/11/24 1028  FERRITIN 161  TIBC 225*  IRON <10*   Sepsis Labs: Recent Labs  Lab 02/10/24 2327  LATICACIDVEN 1.3    Recent Results (from the past 240 hours)  Blood culture (routine x 2)     Status: None (Preliminary result)   Collection Time: 02/10/24 10:50 PM   Specimen: BLOOD RIGHT FOREARM  Result Value Ref Range Status   Specimen Description   Final    BLOOD RIGHT FOREARM Performed at [facility], [address], [place], [place] [address]    Special Requests   Final    BOTTLES DRAWN AEROBIC ONLY Blood  Culture results may not be optimal due to an inadequate volume of blood received in culture bottles Performed at [facility], [address], [place], [place] [address]    Culture   Final    NO GROWTH 3 DAYS Performed at [facility], [address] [place], [place] [address]    Report Status PENDING  Incomplete  Surgical PCR screen     Status: Abnormal   Collection Time: 02/11/24  5:16 AM   Specimen: Nasal Mucosa; Nasal Swab  Result Value Ref Range Status   MRSA, PCR NEGATIVE NEGATIVE Final   Staphylococcus aureus POSITIVE (A) NEGATIVE Final    Comment: RESULT CALLED TO, READ BACK BY AND VERIFIED WITH: DICKIE, A RN @ 1839 02/11/24 CAL (NOTE) The Xpert SA Assay (FDA approved for NASAL specimens in patients 22 years  of age and older), is one component of a comprehensive surveillance program. It is not intended to diagnose infection nor to guide or monitor treatment. Performed at [facility], [address] [place], [place] [address]   Aerobic/Anaerobic Culture w Gram Stain (surgical/deep wound)     Status: None (Preliminary result)   Collection Time: 02/11/24  8:31 AM   Specimen: Path fluid; Tissue  Result Value Ref Range Status   Specimen Description   Final    FLUID Performed at [facility], [address], [place], [place] [address]    Special Requests   Final    NONE Performed at [facility], [address] [place], [place] [address]    Gram Stain   Final    NO WBC SEEN FEW GRAM POSITIVE COCCI MODERATE GRAM NEGATIVE RODS Performed at [facility], [address], [place], [place] [address]    Culture   Final    FEW ENTEROCOCCUS FAECALIS FEW STREPTOCOCCUS ANGINOSIS SUSCEPTIBILITIES TO FOLLOW MIXED ANAEROBIC FLORA PRESENT.  CALL LAB IF FURTHER IID REQUIRED.    Report Status PENDING  Incomplete  Blood culture (routine x 2)     Status: None (Preliminary result)   Collection  Time: 02/11/24 10:28 AM   Specimen: BLOOD LEFT ARM  Result Value Ref Range Status   Specimen Description   Final    BLOOD LEFT ARM Performed at [facility], [address] [place], [place] [address]    Special Requests   Final    BOTTLES DRAWN AEROBIC AND ANAEROBIC Blood Culture results may not be optimal due to an inadequate volume of blood received in culture bottles Performed at [facility], [address] [place], [place] [address]    Culture   Final    NO GROWTH 3 DAYS Performed at [facility], [address] [place], [place] [address]    Report Status PENDING  Incomplete         Radiology Studies: No results found.       Scheduled Meds:  acetaminophen   1,000 mg Oral Q6H   Chlorhexidine  Gluconate Cloth  6 each Topical Daily   ferrous sulfate   325 mg Oral TID WC   influenza vac split trivalent PF  0.5 mL Intramuscular Tomorrow-1000   insulin  aspart  0-9 Units Subcutaneous Q6H   insulin  glargine-yfgn  20 Units Subcutaneous BID   mupirocin ointment  1 Application Nasal BID   Continuous Infusions:  cefTRIAXone  (ROCEPHIN )  IV 2 g (02/13/24 1533)   linezolid (ZYVOX) IV 600 mg (02/13/24 2101)     LOS: 3 days   Time spent:  Elsie JAYSON Montclair, DO [facility]  If 7PM-7AM, please contact night-coverage [email or web address]  02/14/2024, 7:39 AM

## 2024-02-14 NOTE — Plan of Care (Signed)
  Problem: Coping: Goal: Ability to adjust to condition or change in health will improve Outcome: Progressing   Problem: Education: Goal: Knowledge of General Education information will improve Description: Including pain rating scale, medication(s)/side effects and non-pharmacologic comfort measures Outcome: Progressing   Problem: Activity: Goal: Risk for activity intolerance will decrease Outcome: Progressing   

## 2024-02-14 NOTE — Transfer of Care (Signed)
 Immediate Anesthesia Transfer of Care Note  Patient: Evelyn Watts  Procedure(s) Performed: IRRIGATION AND DEBRIDEMENT ABSCESS (Right: Thigh)  Patient Location: PACU  Anesthesia Type:General  Level of Consciousness: awake, alert , and oriented  Airway & Oxygen  Therapy: Patient Spontanous Breathing and Patient connected to face mask oxygen   Post-op Assessment: Report given to RN and Post -op Vital signs reviewed and stable  Post vital signs: Reviewed and stable  Last Vitals:  Vitals Value Taken Time  BP 163/87 02/14/24 17:37  Temp    Pulse 88 02/14/24 17:40  Resp 19 02/14/24 17:40  SpO2 91 % 02/14/24 17:40  Vitals shown include unfiled device data.  Last Pain:  Vitals:   02/14/24 1300  TempSrc: Oral  PainSc:          Complications: No notable events documented.

## 2024-02-15 ENCOUNTER — Encounter (HOSPITAL_COMMUNITY): Payer: Self-pay | Admitting: Surgery

## 2024-02-15 DIAGNOSIS — L03115 Cellulitis of right lower limb: Secondary | ICD-10-CM | POA: Diagnosis not present

## 2024-02-15 LAB — CBC
HCT: 26.2 % — ABNORMAL LOW (ref 36.0–46.0)
Hemoglobin: 7.8 g/dL — ABNORMAL LOW (ref 12.0–15.0)
MCH: 23.1 pg — ABNORMAL LOW (ref 26.0–34.0)
MCHC: 29.8 g/dL — ABNORMAL LOW (ref 30.0–36.0)
MCV: 77.5 fL — ABNORMAL LOW (ref 80.0–100.0)
Platelets: 516 K/uL — ABNORMAL HIGH (ref 150–400)
RBC: 3.38 MIL/uL — ABNORMAL LOW (ref 3.87–5.11)
RDW: 17.2 % — ABNORMAL HIGH (ref 11.5–15.5)
WBC: 16.3 K/uL — ABNORMAL HIGH (ref 4.0–10.5)
nRBC: 0.5 % — ABNORMAL HIGH (ref 0.0–0.2)

## 2024-02-15 LAB — BASIC METABOLIC PANEL WITH GFR
Anion gap: 11 (ref 5–15)
BUN: 6 mg/dL (ref 6–20)
CO2: 25 mmol/L (ref 22–32)
Calcium: 8.9 mg/dL (ref 8.9–10.3)
Chloride: 98 mmol/L (ref 98–111)
Creatinine, Ser: 0.56 mg/dL (ref 0.44–1.00)
GFR, Estimated: >60 mL/min (ref >60)
Glucose, Bld: 189 mg/dL — ABNORMAL HIGH (ref 70–99)
Potassium: 3.7 mmol/L (ref 3.5–5.1)
Sodium: 134 mmol/L — ABNORMAL LOW (ref 135–145)

## 2024-02-15 LAB — GLUCOSE, CAPILLARY
Glucose-Capillary: 184 mg/dL — ABNORMAL HIGH (ref 70–99)
Glucose-Capillary: 219 mg/dL — ABNORMAL HIGH (ref 70–99)
Glucose-Capillary: 163 mg/dL — ABNORMAL HIGH (ref 70–99)

## 2024-02-15 MED ORDER — SODIUM CHLORIDE 0.9 % IV SOLN
3.0000 g | Freq: Four times a day (QID) | INTRAVENOUS | Status: DC
  Administered 2024-02-15 – 2024-02-19 (×16): 3 g via INTRAVENOUS
  Filled 2024-02-15 (×18): qty 8

## 2024-02-15 NOTE — Progress Notes (Signed)
 PROGRESS NOTE    Evelyn Watts  FMW:[id number] DOB: [birth year] DOA: 02/10/2024 PCP: Oley Bascom RAMAN, NP   Brief Narrative:  Evelyn Watts is a 48 y.o. female who presents with right thigh pain concerning for infection.  Patient has known medical history significant for type 2 diabetes mellitus, essential hypertension, chronic hyponatremia with baseline sodium range 128-133, anemia of chronic disease with baseline hemoglobin 10-11.  Hospitalist called for admission, general surgery called in consult for right thigh cellulitis with concern for underlying abscess versus necrotizing fasciitis on imaging.  Assessment & Plan:   Principal Problem:   Cellulitis of right lower extremity Active Problems:   DM2 (diabetes mellitus, type 2) (HCC)   Sepsis (HCC)   Acute on chronic anemia   History of essential hypertension   Chronic hyponatremia  Sepsis due to cellulitis of the right medial thigh:  - Patient febrile with leukocytosis and notable infection, CT remarkable for soft tissue edema and possible underlying abscess versus necrotizing fasciitis given regions of questionable gas - General Surgery following, appreciate insight recommendations, successful I&D 02/11/2024  - Repeat evaluation in the OR 10/22 excisional debridement 20x8cm R thigh wound; 6cm deep - Continue broad-spectrum antibiotics including ceftriaxone , linezolid - Generally improving, no notable fevers, leukocytosis downtrending and pain controlled - Plan for wound vac placement today - disposition pending home vac availability  Acute anemia of acute illness on chronic anemia of chronic disease Concurrent iron deficiency anemia - Continue to follow clinically, no indication for transfusion at this time - Hold IV iron given acute illness/contraindications -initiate p.o. iron supplementation - No signs or symptoms of blood loss - Would have patient recheck iron panel outpatient in the next few weeks to months per  PCP  Insulin -dependent type 2 diabetes, uncontrolled - A1c 11.6 earlier this year - Continue to advance diet per surgery, insulin  continues to increase as appropriate 20 unit glargine twice daily plus sliding scale  Essential hypertension - Well-controlled  Hyponatremia, chronic - Baseline around 130 Chronic hyperosmolar hyponatremia Baseline = 130, at baseline now  DVT prophylaxis: SCDs Start: 02/11/24 0124 Code Status:   Code Status: Full Code Family Communication: At bedside  Status is: Inpatient  Dispo: The patient is from: Home              Anticipated d/c is to: Home              Anticipated d/c date is: 48 to 72 hours              Patient currently not medically stable for discharge  Consultants:  General surgery  Procedures:  Right medial thigh I&D 10/19  Antimicrobials:  Linezolid, ceftriaxone   Subjective: No acute issues or events overnight, pain currently well-controlled, denies nausea vomiting headache fever chills or chest pain  Objective: Vitals:   02/14/24 1815 02/14/24 1830 02/14/24 1935 02/15/24 0451  BP: (!) 157/79 (!) 146/81 (!) 154/80 (!) 176/81  Pulse: 83 83 87 (!) 101  Resp: 18 18 17 18   Temp:   98.4 F (36.9 C) 98.5 F (36.9 C)  TempSrc:   Oral   SpO2: 99% 99% 91% 93%  Weight:      Height:        Intake/Output Summary (Last 24 hours) at 02/15/2024 0747 Last data filed at 02/15/2024 0300 Gross per 24 hour  Intake 1367.28 ml  Output --  Net 1367.28 ml   Filed Weights   02/12/24 0500 02/13/24 0500 02/14/24 1334  Weight: 125.7 kg 124.3 kg 117  kg    Examination:  General:  Pleasantly resting in bed, No acute distress. HEENT:  Normocephalic atraumatic.  Sclerae nonicteric, noninjected.  Extraocular movements intact bilaterally. Neck:  Without mass or deformity.  Trachea is midline. Lungs:  Clear to auscultate bilaterally without rhonchi, wheeze, or rales. Heart:  Regular rate and rhythm.  Without murmurs, rubs, or gallops. Abdomen:   Soft, nontender, nondistended.  Without guarding or rebound. Extremities: Without cyanosis, clubbing, edema, or obvious deformity. Skin: Medial right thigh bandage clean dry intact  Data Reviewed: I have personally reviewed following labs and imaging studies  CBC: Recent Labs  Lab 02/10/24 2112 02/11/24 1028 02/12/24 0444 02/13/24 0504 02/14/24 0542 02/15/24 0626  WBC 18.7* 17.7* 17.8* 17.0* 14.6* 16.3*  NEUTROABS 16.2* 14.4*  --   --   --   --   HGB 8.5* 7.4* 7.4* 7.6* 7.3* 7.8*  HCT 26.7* 23.4* 23.1* 24.1* 24.3* 26.2*  MCV 74.0* 73.6* 75.2* 75.8* 76.7* 77.5*  PLT 461* 386 387 454* 490* 516*   Basic Metabolic Panel: Recent Labs  Lab 02/11/24 1028 02/12/24 0444 02/13/24 0504 02/14/24 0542 02/15/24 0626  NA 131* 129* 130* 136 134*  K 3.7 3.7 3.8 3.7 3.7  CL 98 96* 96* 101 98  CO2 22 21* 23 23 25   GLUCOSE 240* 226* 218* 184* 189*  BUN 22* 22* 17 12 6   CREATININE 0.86 0.88 0.75 0.61 0.56  CALCIUM  8.7* 8.4* 8.7* 8.9 8.9  MG 2.1  --   --   --   --    GFR: Estimated Creatinine Clearance: 123.9 mL/min (by C-G formula based on SCr of 0.56 mg/dL). Liver Function Tests: Recent Labs  Lab 02/11/24 1028  AST 28  ALT 20  ALKPHOS 250*  BILITOT 0.3  PROT 6.4*  ALBUMIN 2.8*   Coagulation Profile: Recent Labs  Lab 02/11/24 1028  INR 1.1   HbA1C: No results for input(s): HGBA1C in the last 72 hours.  CBG: Recent Labs  Lab 02/14/24 1548 02/14/24 1738 02/14/24 1932 02/14/24 2324 02/15/24 0545  GLUCAP 137* 146* 160* 224* 184*   Anemia Panel: No results for input(s): VITAMINB12, FOLATE, FERRITIN, TIBC, IRON, RETICCTPCT in the last 72 hours.  Sepsis Labs: Recent Labs  Lab 02/10/24 2327  LATICACIDVEN 1.3    Recent Results (from the past 240 hours)  Blood culture (routine x 2)     Status: None (Preliminary result)   Collection Time: 02/10/24 10:50 PM   Specimen: BLOOD RIGHT FOREARM  Result Value Ref Range Status   Specimen Description   Final     BLOOD RIGHT FOREARM Performed at [facility], [address] [place], [place] [address]    Special Requests   Final    BOTTLES DRAWN AEROBIC ONLY Blood Culture results may not be optimal due to an inadequate volume of blood received in culture bottles Performed at [facility], [address], [place], [place] [address]    Culture   Final    NO GROWTH 4 DAYS Performed at [facility], [address], [place], [place] [address]    Report Status PENDING  Incomplete  Surgical PCR screen     Status: Abnormal   Collection Time: 02/11/24  5:16 AM   Specimen: Nasal Mucosa; Nasal Swab  Result Value Ref Range Status   MRSA, PCR NEGATIVE NEGATIVE Final   Staphylococcus aureus POSITIVE (A) NEGATIVE Final    Comment: RESULT CALLED TO, READ BACK BY AND VERIFIED WITH: DICKIE, A  RN @ 1839 02/11/24 CAL (NOTE) The Xpert SA Assay (FDA approved for NASAL specimens in patients 47 years of age and older), is one component of a comprehensive surveillance program. It is not intended to diagnose infection nor to guide or monitor treatment. Performed at [facility], [address], [place], [place] [address]   Aerobic/Anaerobic Culture w Gram Stain (surgical/deep wound)     Status: None (Preliminary result)   Collection Time: 02/11/24  8:31 AM   Specimen: Path fluid; Tissue  Result Value Ref Range Status   Specimen Description   Final    FLUID Performed at [facility], [address], [place], [place] [address]    Special Requests   Final    NONE Performed at [facility], [address] [place], [place] [address]    Gram Stain   Final    NO WBC SEEN FEW GRAM POSITIVE COCCI MODERATE GRAM NEGATIVE RODS Performed at [facility], [address] [place], [place] [address]    Culture   Final    FEW ENTEROCOCCUS FAECALIS FEW STREPTOCOCCUS ANGINOSIS MIXED ANAEROBIC FLORA PRESENT.  CALL LAB IF  FURTHER IID REQUIRED.    Report Status PENDING  Incomplete   Organism ID, Bacteria ENTEROCOCCUS FAECALIS  Final      Susceptibility   Enterococcus faecalis - MIC*    AMPICILLIN <=2 SENSITIVE Sensitive     VANCOMYCIN  1 SENSITIVE Sensitive     GENTAMICIN SYNERGY SENSITIVE Sensitive     * FEW ENTEROCOCCUS FAECALIS  Blood culture (routine x 2)     Status: None (Preliminary result)   Collection Time: 02/11/24 10:28 AM   Specimen: BLOOD LEFT ARM  Result Value Ref Range Status   Specimen Description   Final    BLOOD LEFT ARM Performed at [facility], [address] [place], [place] [address]    Special Requests   Final    BOTTLES DRAWN AEROBIC AND ANAEROBIC Blood Culture results may not be optimal due to an inadequate volume of blood received in culture bottles Performed at [facility], [address], [place], [place] [address]    Culture   Final    NO GROWTH 4 DAYS Performed at [facility], [address] [place], [place] [address]    Report Status PENDING  Incomplete  Aerobic/Anaerobic Culture w Gram Stain (surgical/deep wound)     Status: None (Preliminary result)   Collection Time: 02/14/24  5:03 PM   Specimen: Path Tissue  Result Value Ref Range Status   Specimen Description   Final    TISSUE Performed at [facility], [address] [place], [place] [address]    Special Requests RIGHT THIGH ABSCESS  Final   Gram Stain   Final    FEW WBC SEEN MODERATE GRAM NEGATIVE RODS FEW GRAM POSITIVE COCCI Performed at [facility], [address] [place], [place] [address]    Culture PENDING  Incomplete   Report Status PENDING  Incomplete         Radiology Studies: No results found.       Scheduled Meds:  acetaminophen   1,000 mg Oral Q6H   Chlorhexidine  Gluconate Cloth  6 each Topical Daily   ferrous sulfate   325 mg Oral TID WC   influenza vac split trivalent PF  0.5 mL Intramuscular Tomorrow-1000    insulin  aspart  0-9 Units Subcutaneous Q6H   insulin  glargine-yfgn  20  Units Subcutaneous BID   mupirocin ointment  1 Application Nasal BID   Continuous Infusions:  cefTRIAXone  (ROCEPHIN )  IV Stopped (02/13/24 1603)   linezolid (ZYVOX) IV Stopped (02/14/24 2304)     LOS: 4 days   Time spent:  Elsie JAYSON Montclair, DO [facility]  If 7PM-7AM, please contact night-coverage [email or web address]  02/15/2024, 7:47 AM

## 2024-02-15 NOTE — TOC Progression Note (Addendum)
 Transition of Care [facility]) - Progression Note    Patient Details  Name: Evelyn Watts MRN: [id number] Date of Birth: 06-12-1975  [facility]) CM/SW Contact  Leemon Ayala, Nathanel, RN Phone Number: 02/15/2024, 2:11 PM  Clinical Narrative:  in hospital wound vac in place-will check if [employer] rep Thomasina can accept & provide home wound vac through insurance-will await forms & completion of process with MD signature.HHRN-await offers, then choice. -3:18p [facility] accepted for HHRN;NPWT form on shadow chart for MD signature., & WOC to complete.    Expected Discharge Plan: Home w Home Health Services Barriers to Discharge: Continued Medical Work up               Expected Discharge Plan and Services   Discharge Planning Services: CM Consult Post Acute Care Choice: Home Health Living arrangements for the past 2 months: Single Family Home                 DME Arranged: Negative pressure wound device DME Agency: [employer] Date DME Agency Contacted: 02/15/24 Time DME Agency Contacted: [phone] Representative spoke with at DME Agency: Mitch             Social Drivers of Health (SDOH) Interventions SDOH Screenings   Food Insecurity: No Food Insecurity (02/11/2024)  Housing: Low Risk  (02/11/2024)  Transportation Needs: Unmet Transportation Needs (02/11/2024)  Utilities: Not At Risk (02/11/2024)  Depression (PHQ2-9): Low Risk  (09/19/2023)  Financial Resource Strain: Low Risk  (09/13/2023)  Tobacco Use: Low Risk  (02/14/2024)    Readmission Risk Interventions     No data to display

## 2024-02-15 NOTE — Progress Notes (Signed)
 1 Day Post-Op  Subjective: Doing well POD1  Objective: Vital signs in last 24 hours: Temp:  [98.3 F (36.8 C)-98.5 F (36.9 C)] 98.5 F (36.9 C) (10/23 0451) Pulse Rate:  [81-101] 101 (10/23 0451) Resp:  [17-20] 18 (10/23 0451) BP: (136-176)/(73-87) 176/81 (10/23 0451) SpO2:  [91 %-99 %] 93 % (10/23 0451) Weight:  [882 kg] 117 kg (10/22 1334) Last BM Date : 02/11/24  Intake/Output from previous day: 10/22 0701 - 10/23 0700 In: 1367.3 [P.O.:240; I.V.:825.3; IV Piggyback:302] Out: -  Intake/Output this shift: Total I/O In: 240 [P.O.:240] Out: -   PE: Gen:  Alert, NAD, pleasant R thigh: Dressed, no spreading induration around wound  Lab Results:  Recent Labs    02/14/24 0542 02/15/24 0626  WBC 14.6* 16.3*  HGB 7.3* 7.8*  HCT 24.3* 26.2*  PLT 490* 516*   BMET Recent Labs    02/14/24 0542 02/15/24 0626  NA 136 134*  K 3.7 3.7  CL 101 98  CO2 23 25  GLUCOSE 184* 189*  BUN 12 6  CREATININE 0.61 0.56  CALCIUM  8.9 8.9   PT/INR No results for input(s): LABPROT, INR in the last 72 hours.  CMP     Component Value Date/Time   NA 134 (L) 02/15/2024 0626   NA 133 (L) 09/12/2023 1553   K 3.7 02/15/2024 0626   CL 98 02/15/2024 0626   CO2 25 02/15/2024 0626   GLUCOSE 189 (H) 02/15/2024 0626   BUN 6 02/15/2024 0626   BUN 14 09/12/2023 1553   CREATININE 0.56 02/15/2024 0626   CALCIUM  8.9 02/15/2024 0626   PROT 6.4 (L) 02/11/2024 1028   PROT 6.9 09/12/2023 1553   ALBUMIN 2.8 (L) 02/11/2024 1028   ALBUMIN 3.8 (L) 09/12/2023 1553   AST 28 02/11/2024 1028   ALT 20 02/11/2024 1028   ALKPHOS 250 (H) 02/11/2024 1028   BILITOT 0.3 02/11/2024 1028   BILITOT <0.2 09/12/2023 1553   GFRNONAA >60 02/15/2024 0626   GFRAA >60 01/19/2020 2119   Lipase     Component Value Date/Time   LIPASE 21 07/15/2022 1816    Studies/Results: No results found.   Anti-infectives: Anti-infectives (From admission, onward)    Start     Dose/Rate Route Frequency  Ordered Stop   02/12/24 1600  cefTRIAXone  (ROCEPHIN ) 2 g in sodium chloride  0.9 % 100 mL IVPB        2 g 200 mL/hr over 30 Minutes Intravenous Every 24 hours 02/12/24 1450     02/11/24 1600  cefTRIAXone  (ROCEPHIN ) 1 g in sodium chloride  0.9 % 100 mL IVPB  Status:  Discontinued        1 g 200 mL/hr over 30 Minutes Intravenous Every 24 hours 02/11/24 0127 02/12/24 1450   02/11/24 1200  linezolid (ZYVOX) IVPB 600 mg        600 mg 300 mL/hr over 60 Minutes Intravenous Every 12 hours 02/11/24 0127     02/10/24 2245  cefTRIAXone  (ROCEPHIN ) 2 g in sodium chloride  0.9 % 100 mL IVPB        2 g 200 mL/hr over 30 Minutes Intravenous Once 02/10/24 2231 02/10/24 2322   02/10/24 2245  vancomycin  (VANCOCIN ) IVPB 1000 mg/200 mL premix        1,000 mg 200 mL/hr over 60 Minutes Intravenous  Once 02/10/24 2231 02/11/24 0131        Assessment/Plan POD 4 abd 1 s/p incision and drainage right thigh abscess by Dr. Ebbie on 02/11/24 and  Tommaso Cavitt 10/22 - pack daily - Check with wound ostomy team to see if a vac could be applied for outpatient therapy  FEN - NPO for OR. IVF per TRH VTE - SCDs, okay for chem ppx from a general surgery standpoint ID - Rocephin /Linezolid    LOS: 4 days    Deward JINNY Foy, MD [facility] 02/15/2024, 9:31 AM Please see Amion for pager number during day hours 7:00am-4:30pm

## 2024-02-15 NOTE — Plan of Care (Signed)

## 2024-02-15 NOTE — Consult Note (Addendum)
 WOC Nurse Consult Note: Consult requested to apply Vac dressing to full thickness post-op wound to right thigh. Pt is being followed by the surgical team for assessment and plan of care.    Reason for Consult: Pt was medicated for pain prior to the procedure and it was still very painful and she was screaming.  Full thickness post-op wound is beefy red with small amt pink drainage, 20X8X8cm with 5 cm undermining to the right edges.  Applied 2 pieces black foam to cont suction. Applied barrier ring at lower edge to attempt to maintain a sal.   WOC team will plan to change Vac dressing again on Monday if the patient is still in the hospital at that time.   Pt will need insurance approval for the Vac machine and home health assistance for dressing changes after discharge.   Thank-you,  Stephane Fought MSN, RN, CWOCN, CWCN-AP, CNS Contact Mon-Fri 0700-1500: [phone]

## 2024-02-16 ENCOUNTER — Other Ambulatory Visit

## 2024-02-16 DIAGNOSIS — L03115 Cellulitis of right lower limb: Secondary | ICD-10-CM | POA: Diagnosis not present

## 2024-02-16 LAB — AEROBIC/ANAEROBIC CULTURE W GRAM STAIN (SURGICAL/DEEP WOUND): Gram Stain: NONE SEEN

## 2024-02-16 LAB — CULTURE, BLOOD (ROUTINE X 2)
Culture: NO GROWTH
Culture: NO GROWTH

## 2024-02-16 LAB — HEMOGLOBIN AND HEMATOCRIT, BLOOD
HCT: 23.7 % — ABNORMAL LOW (ref 36.0–46.0)
Hemoglobin: 6.9 g/dL — CL (ref 12.0–15.0)

## 2024-02-16 LAB — BASIC METABOLIC PANEL WITH GFR
Anion gap: 12 (ref 5–15)
BUN: 5 mg/dL — ABNORMAL LOW (ref 6–20)
CO2: 24 mmol/L (ref 22–32)
Calcium: 8.4 mg/dL — ABNORMAL LOW (ref 8.9–10.3)
Chloride: 100 mmol/L (ref 98–111)
Creatinine, Ser: 0.53 mg/dL (ref 0.44–1.00)
GFR, Estimated: >60 mL/min (ref >60)
Glucose, Bld: 120 mg/dL — ABNORMAL HIGH (ref 70–99)
Potassium: 3.5 mmol/L (ref 3.5–5.1)
Sodium: 135 mmol/L (ref 135–145)

## 2024-02-16 LAB — CBC
HCT: 22.6 % — ABNORMAL LOW (ref 36.0–46.0)
Hemoglobin: 7 g/dL — ABNORMAL LOW (ref 12.0–15.0)
MCH: 24 pg — ABNORMAL LOW (ref 26.0–34.0)
MCHC: 31 g/dL (ref 30.0–36.0)
MCV: 77.4 fL — ABNORMAL LOW (ref 80.0–100.0)
Platelets: 423 K/uL — ABNORMAL HIGH (ref 150–400)
RBC: 2.92 MIL/uL — ABNORMAL LOW (ref 3.87–5.11)
RDW: 17.1 % — ABNORMAL HIGH (ref 11.5–15.5)
WBC: 18.9 K/uL — ABNORMAL HIGH (ref 4.0–10.5)
nRBC: 0.2 % (ref 0.0–0.2)

## 2024-02-16 LAB — GLUCOSE, CAPILLARY
Glucose-Capillary: 119 mg/dL — ABNORMAL HIGH (ref 70–99)
Glucose-Capillary: 130 mg/dL — ABNORMAL HIGH (ref 70–99)
Glucose-Capillary: 174 mg/dL — ABNORMAL HIGH (ref 70–99)
Glucose-Capillary: 191 mg/dL — ABNORMAL HIGH (ref 70–99)

## 2024-02-16 LAB — PREPARE RBC (CROSSMATCH)

## 2024-02-16 MED ORDER — SODIUM CHLORIDE 0.9% IV SOLUTION: Freq: Once | INTRAVENOUS | Status: DC

## 2024-02-16 MED ORDER — ROSUVASTATIN CALCIUM 20 MG PO TABS
40.0000 mg | ORAL_TABLET | Freq: Every day | ORAL | Status: DC
  Administered 2024-02-16 – 2024-02-19 (×4): 40 mg via ORAL
  Filled 2024-02-16 (×4): qty 2

## 2024-02-16 MED ORDER — GABAPENTIN 300 MG PO CAPS
300.0000 mg | ORAL_CAPSULE | Freq: Three times a day (TID) | ORAL | Status: DC
  Administered 2024-02-16 – 2024-02-19 (×9): 300 mg via ORAL
  Filled 2024-02-16 (×9): qty 1

## 2024-02-16 MED ORDER — HEPARIN SODIUM (PORCINE) 5000 UNIT/ML IJ SOLN
5000.0000 [IU] | Freq: Three times a day (TID) | INTRAMUSCULAR | Status: DC
  Administered 2024-02-16 – 2024-02-19 (×10): 5000 [IU] via SUBCUTANEOUS
  Filled 2024-02-16 (×10): qty 1

## 2024-02-16 MED ORDER — SODIUM CHLORIDE 0.9 % IV SOLN: INTRAVENOUS | Status: AC | PRN

## 2024-02-16 NOTE — Progress Notes (Signed)
 2 Days Post-Op  Subjective: Wound vac placed  Objective: Vital signs in last 24 hours: Temp:  [98.7 F (37.1 C)-99.2 F (37.3 C)] 99.2 F (37.3 C) (10/24 1331) Pulse Rate:  [85-100] 93 (10/24 1331) Resp:  [18-20] 20 (10/24 1331) BP: (135-153)/(72-109) 140/72 (10/24 1331) SpO2:  [95 %-97 %] 97 % (10/24 1331) Last BM Date : 02/11/24  Intake/Output from previous day: 10/23 0701 - 10/24 0700 In: 1440 [P.O.:840; IV Piggyback:600] Out: 400 [Urine:400] Intake/Output this shift: Total I/O In: 100 [IV Piggyback:100] Out: -   PE: Gen:  Alert, NAD, pleasant R thigh: Dressed, no spreading induration around wound  Lab Results:  Recent Labs    02/15/24 0626 02/16/24 0526 02/16/24 1155  WBC 16.3* 18.9*  --   HGB 7.8* 7.0* 6.9*  HCT 26.2* 22.6* 23.7*  PLT 516* 423*  --    BMET Recent Labs    02/15/24 0626 02/16/24 0526  NA 134* 135  K 3.7 3.5  CL 98 100  CO2 25 24  GLUCOSE 189* 120*  BUN 6 5*  CREATININE 0.56 0.53  CALCIUM  8.9 8.4*   PT/INR No results for input(s): LABPROT, INR in the last 72 hours.  CMP     Component Value Date/Time   NA 135 02/16/2024 0526   NA 133 (L) 09/12/2023 1553   K 3.5 02/16/2024 0526   CL 100 02/16/2024 0526   CO2 24 02/16/2024 0526   GLUCOSE 120 (H) 02/16/2024 0526   BUN 5 (L) 02/16/2024 0526   BUN 14 09/12/2023 1553   CREATININE 0.53 02/16/2024 0526   CALCIUM  8.4 (L) 02/16/2024 0526   PROT 6.4 (L) 02/11/2024 1028   PROT 6.9 09/12/2023 1553   ALBUMIN 2.8 (L) 02/11/2024 1028   ALBUMIN 3.8 (L) 09/12/2023 1553   AST 28 02/11/2024 1028   ALT 20 02/11/2024 1028   ALKPHOS 250 (H) 02/11/2024 1028   BILITOT 0.3 02/11/2024 1028   BILITOT <0.2 09/12/2023 1553   GFRNONAA >60 02/16/2024 0526   GFRAA >60 01/19/2020 2119   Lipase     Component Value Date/Time   LIPASE 21 07/15/2022 1816    Studies/Results: No results found.   Anti-infectives: Anti-infectives (From admission, onward)    Start     Dose/Rate Route  Frequency Ordered Stop   02/15/24 1400  Ampicillin-Sulbactam (UNASYN) 3 g in sodium chloride  0.9 % 100 mL IVPB        3 g 200 mL/hr over 30 Minutes Intravenous Every 6 hours 02/15/24 1252     02/12/24 1600  cefTRIAXone  (ROCEPHIN ) 2 g in sodium chloride  0.9 % 100 mL IVPB  Status:  Discontinued        2 g 200 mL/hr over 30 Minutes Intravenous Every 24 hours 02/12/24 1450 02/15/24 1252   02/11/24 1600  cefTRIAXone  (ROCEPHIN ) 1 g in sodium chloride  0.9 % 100 mL IVPB  Status:  Discontinued        1 g 200 mL/hr over 30 Minutes Intravenous Every 24 hours 02/11/24 0127 02/12/24 1450   02/11/24 1200  linezolid (ZYVOX) IVPB 600 mg  Status:  Discontinued        600 mg 300 mL/hr over 60 Minutes Intravenous Every 12 hours 02/11/24 0127 02/15/24 1252   02/10/24 2245  cefTRIAXone  (ROCEPHIN ) 2 g in sodium chloride  0.9 % 100 mL IVPB        2 g 200 mL/hr over 30 Minutes Intravenous Once 02/10/24 2231 02/10/24 2322   02/10/24 2245  vancomycin  (VANCOCIN ) IVPB  1000 mg/200 mL premix        1,000 mg 200 mL/hr over 60 Minutes Intravenous  Once 02/10/24 2231 02/11/24 0131        Assessment/Plan POD 5 and 2 s/p incision and drainage right thigh abscess by Dr. Ebbie on 02/11/24 and Esra Frankowski 10/22 - Wound vac - Follow WBC and HGB over weekend, vac change Monday, then possible discharge if looking appropriate - Check with wound ostomy team to see if a vac could be applied for outpatient therapy  FEN - Regular diet VTE - SCDs, okay for chem ppx from a general surgery standpoint ID - Rocephin /Linezolid    LOS: 5 days    Deward JINNY Foy, MD [facility] 02/16/2024, 3:06 PM Please see Amion for pager number during day hours 7:00am-4:30pm

## 2024-02-16 NOTE — TOC Progression Note (Signed)
 Transition of Care [facility]) - Progression Note    Patient Details  Name: Evelyn Watts MRN: [id number] Date of Birth: 05/07/1975  Transition of Care [facility]) CM/SW Contact  Cinzia Devos, Nathanel, RN Phone Number: 02/16/2024, 3:59 PM  Clinical Narrative:Awaiting NPWT [employer] rep form to be signed by surgeon in shadow chart for wound vac. [facility] for HHRN-vac changes.       Expected Discharge Plan: Home w Home Health Services Barriers to Discharge: Continued Medical Work up               Expected Discharge Plan and Services   Discharge Planning Services: CM Consult Post Acute Care Choice: Home Health Living arrangements for the past 2 months: Single Family Home                 DME Arranged: Negative pressure wound device DME Agency: [employer] Date DME Agency Contacted: 02/15/24 Time DME Agency Contacted: [phone] Representative spoke with at DME Agency: Mitch HH Arranged: PT, OT HH Agency: [facility] Date [facility] Agency Contacted: 02/15/24 Time HH Agency Contacted: [phone] Representative spoke with at [facility] Agency: kelly   Social Drivers of Health (SDOH) Interventions SDOH Screenings   Food Insecurity: No Food Insecurity (02/11/2024)  Housing: Low Risk  (02/11/2024)  Transportation Needs: Unmet Transportation Needs (02/11/2024)  Utilities: Not At Risk (02/11/2024)  Depression (PHQ2-9): Low Risk  (09/19/2023)  Financial Resource Strain: Low Risk  (09/13/2023)  Tobacco Use: Low Risk  (02/14/2024)    Readmission Risk Interventions     No data to display

## 2024-02-16 NOTE — Progress Notes (Signed)
 PROGRESS NOTE    Evelyn Watts  FMW:[id number] DOB: 10/17/1975 DOA: 02/10/2024 PCP: Oley Bascom RAMAN, NP   Brief Narrative:  Evelyn Watts is a 48 y.o. female who presents with right thigh pain concerning for infection.  Patient has known medical history significant for type 2 diabetes mellitus, essential hypertension, chronic hyponatremia with baseline sodium range 128-133, anemia of chronic disease with baseline hemoglobin 10-11.  Hospitalist called for admission, general surgery called in consult for right thigh cellulitis with concern for underlying abscess versus necrotizing fasciitis on imaging.  Assessment & Plan:   Principal Problem:   Cellulitis of right lower extremity Active Problems:   DM2 (diabetes mellitus, type 2) (HCC)   Sepsis (HCC)   Acute on chronic anemia   History of essential hypertension   Chronic hyponatremia  Sepsis due to cellulitis of the right medial thigh:  - Patient febrile with leukocytosis and notable infection, CT remarkable for soft tissue edema and possible underlying abscess versus necrotizing fasciitis given regions of questionable gas - General Surgery following, appreciate insight recommendations, successful I&D 02/11/2024  - Repeat evaluation in the OR 10/22 excisional debridement 20x8cm R thigh wound; 6cm deep - Continue broad-spectrum antibiotics including ceftriaxone , linezolid - Generally improving, no notable fevers, leukocytosis downtrending and pain controlled - Wound vac successfully placed 10/23 - tolerating wel  Acute anemia of acute illness/blood loss Chronic anemia of chronic disease Concurrent iron deficiency anemia - Continue to follow clinically, no indication for transfusion at this time - Hold IV iron given acute illness/contraindications -initiate p.o. iron supplementation - No signs or symptoms of blood loss - Would have patient recheck iron panel outpatient in the next few weeks to months per PCP - Type/screen-  repeat H/H remains low at 6.9 10/24 - transfuse 1u PRBC  Insulin -dependent type 2 diabetes, uncontrolled - A1c 11.6 earlier this year - Continue to advance diet per surgery, insulin  continues to increase as appropriate 20 unit glargine twice daily plus sliding scale  Essential hypertension - Well-controlled  Hyponatremia, chronic - Baseline around 130 Chronic hyperosmolar hyponatremia Baseline = 130, at baseline now  DVT prophylaxis: heparin injection 5,000 Units Start: 02/16/24 0900 SCDs Start: 02/11/24 0124 Code Status:   Code Status: Full Code Family Communication: At bedside  Status is: Inpatient  Dispo: The patient is from: Home              Anticipated d/c is to: Home              Anticipated d/c date is: 48 to 72 hours              Patient currently not medically stable for discharge  Consultants:  General surgery  Procedures:  Right medial thigh I&D 10/19  Antimicrobials:  Linezolid, ceftriaxone   Subjective: No acute issues or events overnight, pain currently well-controlled, denies nausea vomiting headache fever chills or chest pain  Objective: Vitals:   02/15/24 1035 02/15/24 1324 02/15/24 1938 02/16/24 0439  BP: (!) 141/73 (!) 155/77 (!) 153/109 135/72  Pulse: 95 96 100 85  Resp: 18 17 18 19   Temp:  99.7 F (37.6 C) 98.8 F (37.1 C) 98.7 F (37.1 C)  TempSrc:  Oral    SpO2:  95% 97% 95%  Weight:      Height:        Intake/Output Summary (Last 24 hours) at 02/16/2024 0803 Last data filed at 02/16/2024 0300 Gross per 24 hour  Intake 1200 ml  Output 400 ml  Net 800 ml  Filed Weights   02/12/24 0500 02/13/24 0500 02/14/24 1334  Weight: 125.7 kg 124.3 kg 117 kg    Examination:  General:  Pleasantly resting in bed, No acute distress. HEENT:  Normocephalic atraumatic.  Sclerae nonicteric, noninjected.  Extraocular movements intact bilaterally. Neck:  Without mass or deformity.  Trachea is midline. Lungs:  Clear to auscultate bilaterally without  rhonchi, wheeze, or rales. Heart:  Regular rate and rhythm.  Without murmurs, rubs, or gallops. Abdomen:  Soft, nontender, nondistended.  Without guarding or rebound. Extremities: Without cyanosis, clubbing, edema, or obvious deformity. Skin: Medial right thigh bandage clean dry intact  Data Reviewed: I have personally reviewed following labs and imaging studies  CBC: Recent Labs  Lab 02/10/24 2112 02/11/24 1028 02/12/24 0444 02/13/24 0504 02/14/24 0542 02/15/24 0626 02/16/24 0526  WBC 18.7* 17.7* 17.8* 17.0* 14.6* 16.3* 18.9*  NEUTROABS 16.2* 14.4*  --   --   --   --   --   HGB 8.5* 7.4* 7.4* 7.6* 7.3* 7.8* 7.0*  HCT 26.7* 23.4* 23.1* 24.1* 24.3* 26.2* 22.6*  MCV 74.0* 73.6* 75.2* 75.8* 76.7* 77.5* 77.4*  PLT 461* 386 387 454* 490* 516* 423*   Basic Metabolic Panel: Recent Labs  Lab 02/11/24 1028 02/12/24 0444 02/13/24 0504 02/14/24 0542 02/15/24 0626 02/16/24 0526  NA 131* 129* 130* 136 134* 135  K 3.7 3.7 3.8 3.7 3.7 3.5  CL 98 96* 96* 101 98 100  CO2 22 21* 23 23 25 24   GLUCOSE 240* 226* 218* 184* 189* 120*  BUN 22* 22* 17 12 6  5*  CREATININE 0.86 0.88 0.75 0.61 0.56 0.53  CALCIUM  8.7* 8.4* 8.7* 8.9 8.9 8.4*  MG 2.1  --   --   --   --   --    GFR: Estimated Creatinine Clearance: 123.9 mL/min (by C-G formula based on SCr of 0.53 mg/dL). Liver Function Tests: Recent Labs  Lab 02/11/24 1028  AST 28  ALT 20  ALKPHOS 250*  BILITOT 0.3  PROT 6.4*  ALBUMIN 2.8*   Coagulation Profile: Recent Labs  Lab 02/11/24 1028  INR 1.1   HbA1C: No results for input(s): HGBA1C in the last 72 hours.  CBG: Recent Labs  Lab 02/15/24 0545 02/15/24 1133 02/15/24 1717 02/16/24 0020 02/16/24 0544  GLUCAP 184* 219* 163* 130* 119*   Anemia Panel: No results for input(s): VITAMINB12, FOLATE, FERRITIN, TIBC, IRON, RETICCTPCT in the last 72 hours.  Sepsis Labs: Recent Labs  Lab 02/10/24 2327  LATICACIDVEN 1.3    Recent Results (from the past 240  hours)  Blood culture (routine x 2)     Status: None   Collection Time: 02/10/24 10:50 PM   Specimen: BLOOD RIGHT FOREARM  Result Value Ref Range Status   Specimen Description   Final    BLOOD RIGHT FOREARM Performed at [facility], [address], [place], [place] [address]    Special Requests   Final    BOTTLES DRAWN AEROBIC ONLY Blood Culture results may not be optimal due to an inadequate volume of blood received in culture bottles Performed at [facility], [address], [place], [place] [address]    Culture   Final    NO GROWTH 5 DAYS Performed at [facility], [address], [place], [place] [address]    Report Status 02/16/2024 FINAL  Final  Surgical PCR screen     Status: Abnormal   Collection Time: 02/11/24  5:16 AM   Specimen: Nasal  Mucosa; Nasal Swab  Result Value Ref Range Status   MRSA, PCR NEGATIVE NEGATIVE Final   Staphylococcus aureus POSITIVE (A) NEGATIVE Final    Comment: RESULT CALLED TO, READ BACK BY AND VERIFIED WITH: DICKIE, A RN @ 1839 02/11/24 CAL (NOTE) The Xpert SA Assay (FDA approved for NASAL specimens in patients 78 years of age and older), is one component of a comprehensive surveillance program. It is not intended to diagnose infection nor to guide or monitor treatment. Performed at [facility], [address] [place], [place] [address]   Aerobic/Anaerobic Culture w Gram Stain (surgical/deep wound)     Status: None   Collection Time: 02/11/24  8:31 AM   Specimen: Path fluid; Tissue  Result Value Ref Range Status   Specimen Description   Final    FLUID Performed at [facility], [address], [place], [place] [address]    Special Requests   Final    NONE Performed at [facility], [address], [place], [place] [address]    Gram Stain   Final    NO WBC SEEN FEW GRAM POSITIVE COCCI MODERATE GRAM NEGATIVE RODS Performed at [facility], [address], [place], [place] [address]    Culture   Final    FEW ENTEROCOCCUS FAECALIS FEW STREPTOCOCCUS ANGINOSIS CALL MICROBIOLOGY LAB IF SENSITIVITIES ARE REQUIRED. MIXED ANAEROBIC FLORA PRESENT.  CALL LAB IF FURTHER IID REQUIRED.    Report Status 02/16/2024 FINAL  Final   Organism ID, Bacteria ENTEROCOCCUS FAECALIS  Final      Susceptibility   Enterococcus faecalis - MIC*    AMPICILLIN <=2 SENSITIVE Sensitive     VANCOMYCIN  1 SENSITIVE Sensitive     GENTAMICIN SYNERGY SENSITIVE Sensitive     * FEW ENTEROCOCCUS FAECALIS  Blood culture (routine x 2)     Status: None   Collection Time: 02/11/24 10:28 AM   Specimen: BLOOD LEFT ARM  Result Value Ref Range Status   Specimen Description   Final    BLOOD LEFT ARM Performed at [facility], [address], [place], [place] [address]    Special Requests   Final    BOTTLES DRAWN AEROBIC AND ANAEROBIC Blood Culture results may not be optimal due to an inadequate volume of blood received in culture bottles Performed at [facility], [address] [place], [place] [address]    Culture   Final    NO GROWTH 5 DAYS Performed at [facility], [address], [place], [place] [address]    Report Status 02/16/2024 FINAL  Final  Aerobic/Anaerobic Culture w Gram Stain (surgical/deep wound)     Status: None (Preliminary result)   Collection Time: 02/14/24  5:03 PM   Specimen: Path Tissue  Result Value Ref Range Status   Specimen Description   Final    TISSUE Performed at [facility], [address] [place] [address], [place], [place] [address]    Special Requests RIGHT THIGH ABSCESS  Final   Gram Stain   Final    FEW WBC SEEN MODERATE GRAM NEGATIVE RODS FEW GRAM POSITIVE COCCI    Culture   Final    CULTURE REINCUBATED FOR BETTER GROWTH Performed at [facility], [address] [place], [place] [address]    Report Status PENDING  Incomplete         Radiology Studies: No results  found.       Scheduled Meds:  acetaminophen   1,000 mg Oral Q6H   ferrous sulfate   325 mg Oral TID WC   heparin injection (subcutaneous)  5,000 Units Subcutaneous Q8H   influenza vac split trivalent PF  0.5 mL Intramuscular Tomorrow-1000   insulin  aspart  0-9 Units Subcutaneous Q6H   insulin  glargine-yfgn  20 Units Subcutaneous BID   mupirocin ointment  1 Application Nasal BID   Continuous Infusions:  ampicillin-sulbactam (UNASYN) IV 3 g (02/16/24 0205)     LOS: 5 days   Time spent:  Elsie JAYSON Montclair, DO [facility]  If 7PM-7AM, please contact night-coverage [email or web address]  02/16/2024, 8:03 AM

## 2024-02-16 NOTE — Progress Notes (Signed)
   02/16/24 1240  Provider Notification  Provider Name/Title Dr. Lue  Date Provider Notified 02/16/24  Time Provider Notified 1240  Method of Notification Page  Notification Reason Critical Result  Test performed and critical result Hgb  Date Critical Result Received 10/02/23  Time Critical Result Received 1231  Provider response See new orders  Date of Provider Response 02/16/24  Time of Provider Response 1240

## 2024-02-17 DIAGNOSIS — L03115 Cellulitis of right lower limb: Secondary | ICD-10-CM | POA: Diagnosis not present

## 2024-02-17 LAB — GLUCOSE, CAPILLARY
Glucose-Capillary: 142 mg/dL — ABNORMAL HIGH (ref 70–99)
Glucose-Capillary: 182 mg/dL — ABNORMAL HIGH (ref 70–99)
Glucose-Capillary: 195 mg/dL — ABNORMAL HIGH (ref 70–99)
Glucose-Capillary: 209 mg/dL — ABNORMAL HIGH (ref 70–99)
Glucose-Capillary: 227 mg/dL — ABNORMAL HIGH (ref 70–99)

## 2024-02-17 LAB — CBC
HCT: 24.1 % — ABNORMAL LOW (ref 36.0–46.0)
Hemoglobin: 7.4 g/dL — ABNORMAL LOW (ref 12.0–15.0)
MCH: 23.9 pg — ABNORMAL LOW (ref 26.0–34.0)
MCHC: 30.7 g/dL (ref 30.0–36.0)
MCV: 78 fL — ABNORMAL LOW (ref 80.0–100.0)
Platelets: 411 K/uL — ABNORMAL HIGH (ref 150–400)
RBC: 3.09 MIL/uL — ABNORMAL LOW (ref 3.87–5.11)
RDW: 17.2 % — ABNORMAL HIGH (ref 11.5–15.5)
WBC: 16.7 K/uL — ABNORMAL HIGH (ref 4.0–10.5)
nRBC: 0.2 % (ref 0.0–0.2)

## 2024-02-17 MED ORDER — GLUCERNA SHAKE PO LIQD
237.0000 mL | Freq: Three times a day (TID) | ORAL | Status: DC
  Administered 2024-02-17 (×2): 237 mL via ORAL
  Filled 2024-02-17 (×4): qty 237

## 2024-02-17 MED ORDER — MENTHOL 3 MG MT LOZG
1.0000 | LOZENGE | OROMUCOSAL | Status: DC | PRN
  Administered 2024-02-17: 3 mg via ORAL
  Filled 2024-02-17: qty 9

## 2024-02-17 MED ORDER — LISINOPRIL 20 MG PO TABS
20.0000 mg | ORAL_TABLET | Freq: Every day | ORAL | Status: DC
  Administered 2024-02-17 – 2024-02-19 (×3): 20 mg via ORAL
  Filled 2024-02-17 (×3): qty 1

## 2024-02-17 NOTE — Hospital Course (Addendum)
 48yo with h/o DM, HTN, chronic hyponatremia (baseline 128-133), and anemia of chronic disease (baseline 10-11) who presented on 10/18 with R thigh pain, found to have necrotizing fasciitis.  Underwent OR debridement on 10/19, 10/22.  Has wound vac.  On IV Ceftriaxone  and Linezolid.  Also with worsening anemia s/p transfusion of 1 unit PRBC.  Surgery planning for wound vac change on 10/27 and then dc to home.

## 2024-02-17 NOTE — Plan of Care (Signed)
  Problem: Education: Goal: Knowledge of General Education information will improve Description: Including pain rating scale, medication(s)/side effects and non-pharmacologic comfort measures Outcome: Progressing   Problem: Clinical Measurements: Goal: Ability to maintain clinical measurements within normal limits will improve Outcome: Progressing Goal: Diagnostic test results will improve Outcome: Progressing   Problem: Activity: Goal: Risk for activity intolerance will decrease Outcome: Progressing   Problem: Nutrition: Goal: Adequate nutrition will be maintained Outcome: Progressing   Problem: Pain Managment: Goal: General experience of comfort will improve and/or be controlled Outcome: Progressing   Problem: Safety: Goal: Ability to remain free from injury will improve Outcome: Progressing

## 2024-02-17 NOTE — Progress Notes (Signed)
 3 Days Post-Op   Subjective/Chief Complaint: No complaints. Wants to go home   Objective: Vital signs in last 24 hours: Temp:  [98.3 F (36.8 C)-99.5 F (37.5 C)] 98.3 F (36.8 C) (10/25 0532) Pulse Rate:  [82-93] 83 (10/25 0532) Resp:  [18-20] 20 (10/25 0532) BP: (109-144)/(63-85) 135/73 (10/25 0532) SpO2:  [96 %-98 %] 98 % (10/25 0532) Weight:  [126.3 kg] 126.3 kg (10/25 0500) Last BM Date : 02/11/24  Intake/Output from previous day: 10/24 0701 - 10/25 0700 In: 986.8 [P.O.:240; I.V.:8.8; Blood:418; IV Piggyback:320] Out: 125 [Drains:125] Intake/Output this shift: No intake/output data recorded.  General appearance: alert and cooperative Resp: clear to auscultation bilaterally Cardio: regular rate and rhythm Extremities: wound vac in place. No cellulitis  Lab Results:  Recent Labs    02/16/24 0526 02/16/24 1155 02/17/24 0559  WBC 18.9*  --  16.7*  HGB 7.0* 6.9* 7.4*  HCT 22.6* 23.7* 24.1*  PLT 423*  --  411*   BMET Recent Labs    02/15/24 0626 02/16/24 0526  NA 134* 135  K 3.7 3.5  CL 98 100  CO2 25 24  GLUCOSE 189* 120*  BUN 6 5*  CREATININE 0.56 0.53  CALCIUM  8.9 8.4*   PT/INR No results for input(s): LABPROT, INR in the last 72 hours. ABG No results for input(s): PHART, HCO3 in the last 72 hours.  Invalid input(s): PCO2, PO2  Studies/Results: No results found.  Anti-infectives: Anti-infectives (From admission, onward)    Start     Dose/Rate Route Frequency Ordered Stop   02/15/24 1400  Ampicillin-Sulbactam (UNASYN) 3 g in sodium chloride  0.9 % 100 mL IVPB        3 g 200 mL/hr over 30 Minutes Intravenous Every 6 hours 02/15/24 1252     02/12/24 1600  cefTRIAXone  (ROCEPHIN ) 2 g in sodium chloride  0.9 % 100 mL IVPB  Status:  Discontinued        2 g 200 mL/hr over 30 Minutes Intravenous Every 24 hours 02/12/24 1450 02/15/24 1252   02/11/24 1600  cefTRIAXone  (ROCEPHIN ) 1 g in sodium chloride  0.9 % 100 mL IVPB  Status:   Discontinued        1 g 200 mL/hr over 30 Minutes Intravenous Every 24 hours 02/11/24 0127 02/12/24 1450   02/11/24 1200  linezolid (ZYVOX) IVPB 600 mg  Status:  Discontinued        600 mg 300 mL/hr over 60 Minutes Intravenous Every 12 hours 02/11/24 0127 02/15/24 1252   02/10/24 2245  cefTRIAXone  (ROCEPHIN ) 2 g in sodium chloride  0.9 % 100 mL IVPB        2 g 200 mL/hr over 30 Minutes Intravenous Once 02/10/24 2231 02/10/24 2322   02/10/24 2245  vancomycin  (VANCOCIN ) IVPB 1000 mg/200 mL premix        1,000 mg 200 mL/hr over 60 Minutes Intravenous  Once 02/10/24 2231 02/11/24 0131       Assessment/Plan: s/p Procedure(s): IRRIGATION AND DEBRIDEMENT ABSCESS (Right) Advance diet Continue IV abx until wbc normalizes Continue wound vac. Change Monday Follow wbc Regular diet  LOS: 6 days    Evelyn Watts 02/17/2024

## 2024-02-17 NOTE — Progress Notes (Signed)
 Progress Note   Patient: Evelyn Watts FMW:[id number] DOB: [birth year] DOA: 02/10/2024     6 DOS: the patient was seen and examined on 02/17/2024   Brief hospital course: 48yo with h/o DM, HTN, chronic hyponatremia (baseline 128-133), and anemia of chronic disease (baseline 10-11) who presented on 10/18 with R thigh pain, found to have necrotizing fasciitis.  Underwent OR debridement on 10/19, 10/22.  Has wound vac.  On IV Ceftriaxone  and Linezolid.  Also with worsening anemia s/p transfusion of 1 unit PRBC.  Surgery planning for wound vac change on 10/27 and then dc to home.  Assessment and Plan:  Sepsis due to cellulitis of the right medial thigh Presented with fever to 101, tachycardia, leukocytosis due to R thigh necrotizing fasciitis General Surgery consulted Underwent I&D on 10/19 and 10/22 Has R medial thigh wound vac Continue broad-spectrum antibiotics including ceftriaxone , linezolid with plan to continue until WBC normalizes per surgery Wound vac change Monday and then possibly home Pain control with Tylenol , oxy, Dilaudid    Acute on chronic anemia Baseline anemia of chronic disease (Hgb 10-11) Now with superimposed ABLA Hold IV iron given acute illness/contraindications - initiated p.o. iron supplementation Will need eventual repeat outpatient iron panel Transfused 1 unit PRBC on 10/24 Hgb 7.4 today Recheck CBC in AM with plan to transfuse for Hgb <7   Insulin -dependent type 2 diabetes, uncontrolled Last A1c 11.6, very poor control Her husband reports markedly improved control while here and wants to continue this regimen at the time of dc Currently on Semglee  20 units BID with moderate-scale SSI Will request DM coordinator consult Continue gabapentin    Essential hypertension  Resume lisinopril   HLD Continue rosuvastatin   Hyponatremia, chronic  Baseline around 130 Normal on BMP on 10/24 Recheck in AM  Class 2 obesity Body mass index is 38.83 kg/m.[name]   Weight loss should be encouraged on an ongoing basis Resume semaglutide  at the time of dc Outpatient PCP/bariatric medicine f/u encouraged Significantly low or high BMI is associated with higher medical risk including morbidity and mortality      Consultants: Surgery [facility] team  Procedures: I&D of abscess 10/19 and 10/22  Antibiotics: Unasyn 10/23- Ceftriaxone  10/18-23 Linezolid 10/19-23 Vancomycin  x 1  30 Day Unplanned Readmission Risk Score    Flowsheet Row ED to Hosp-Admission (Current) from 02/10/2024 in [facility] 4TH FLOOR PROGRESSIVE CARE AND UROLOGY  30 Day Unplanned Readmission Risk Score (%) 14.5 Filed at 02/17/2024 0400    This score is the patient's risk of an unplanned readmission within 30 days of being discharged (0 -100%). The score is based on dignosis, age, lab data, medications, orders, and past utilization.   Low:  0-14.9   Medium: 15-21.9   High: 22-29.9   Extreme: 30 and above           Subjective: Somnolent today.  Husband present and had lots of questions.   Objective: Vitals:   02/17/24 0532 02/17/24 1337  BP: 135/73 (!) 166/83  Pulse: 83 85  Resp: 20 16  Temp: 98.3 F (36.8 C) 98.2 F (36.8 C)  SpO2: 98% 96%    Intake/Output Summary (Last 24 hours) at 02/17/2024 1455 Last data filed at 02/17/2024 1338 Gross per 24 hour  Intake 1366.76 ml  Output 125 ml  Net 1241.76 ml   Filed Weights   02/13/24 0500 02/14/24 1334 02/17/24 0500  Weight: 124.3 kg 117 kg 126.3 kg    Exam:  General:  Appears calm and comfortable and is in NAD Eyes:  normal lids, iris ENT:  grossly normal hearing, lips & tongue, mmm Cardiovascular:  RRR. No LE edema.  Respiratory:   CTA bilaterally with no wheezes/rales/rhonchi.  Normal respiratory effort. Abdomen:  soft, NT, ND Skin:  R medial thigh wound vac in place Musculoskeletal:  grossly normal tone BUE/BLE, no bony abnormality Psychiatric:  somnolent mood and affect, speech sparse but appropriate,  AOx3 Neurologic:  CN 2-12 grossly intact, moves all extremities in coordinated fashion  Data Reviewed: I have reviewed the patient's lab results since admission.  Pertinent labs for today include:   WBC 16.7 Hgb 7.4, up from 7 on 10/24 after 1 unit PRBC Platelets 411     Family Communication: Husband was present throughout      Code Status: Full Code   Disposition: Status is: Inpatient Remains inpatient appropriate because: ongoing management     Time spent: 50 minutes  Unresulted Labs (From admission, onward)     Start     Ordered   02/18/24 0500  Basic metabolic panel with GFR  Tomorrow morning,   R       Question:  Specimen collection method  Answer:  Lab=Lab collect   02/17/24 1442   02/18/24 0500  CBC  Tomorrow morning,   R       Question:  Specimen collection method  Answer:  Lab=Lab collect   02/17/24 1444             Author: Delon Herald, MD 02/17/2024 2:55 PM  For on call review [email or web address].

## 2024-02-17 NOTE — Inpatient Diabetes Management (Signed)
 Inpatient Diabetes Program Recommendations  AACE/ADA: New Consensus Statement on Inpatient Glycemic Control   Target Ranges:  Prepandial:   less than 140 mg/dL      Peak postprandial:   less than 180 mg/dL (1-2 hours)      Critically ill patients:  140 - 180 mg/dL    Latest Reference Range & Units 02/16/24 00:20 02/16/24 05:44 02/16/24 12:10 02/16/24 17:51 02/17/24 00:21 02/17/24 06:13 02/17/24 11:43  Glucose-Capillary 70 - 99 mg/dL 869 (H) 880 (H) 808 (H) 174 (H) 209 (H) 142 (H) 182 (H)    Latest Reference Range & Units 06/08/23 08:53 11/09/23 15:01 02/11/24 10:28  Hemoglobin A1C 4.8 - 5.6 % 12.8 ! 11.6 ! 12.5 (H)   Review of Glycemic Control  Diabetes history: DM2 Outpatient Diabetes medications: Lantus  30 units BID, Metformin  1000 mg BID, Ozempic  1 mg Qweek Current orders for Inpatient glycemic control: Semglee  20 units BID, Novolog  0-9 units Q6H  Recommendation:  Outpatient DM: May want to consider adding short acting insulin  to outpatient DM medication regimen. IF so, please provide Rx for Novolog  Flexpens (#[id number]).   NOTE: Noted consult for Diabetes Coordinator for A1C >10. Diabetes Coordinator is not on campus over the weekend but available by pager from 8am to 5pm for questions or concerns. Inpatient diabetes coordinator spoke with patient at length on 02/13/24 regarding DM, A1C, importance of improving DM control especially for healing, and short acting insulin  was discussed in case patient was discharged on it. CBGs today 142-182 mg/dl. Will follow along and make   Thanks, Earnie Gainer, RN, MSN, CDCES Diabetes Coordinator Inpatient Diabetes Program [phone] (Team Pager from 8am to 5pm)

## 2024-02-18 DIAGNOSIS — L03115 Cellulitis of right lower limb: Secondary | ICD-10-CM | POA: Diagnosis not present

## 2024-02-18 LAB — BASIC METABOLIC PANEL WITH GFR
Anion gap: 9 (ref 5–15)
BUN: 5 mg/dL — ABNORMAL LOW (ref 6–20)
CO2: 28 mmol/L (ref 22–32)
Calcium: 8.9 mg/dL (ref 8.9–10.3)
Chloride: 100 mmol/L (ref 98–111)
Creatinine, Ser: 0.45 mg/dL (ref 0.44–1.00)
GFR, Estimated: >60 mL/min (ref >60)
Glucose, Bld: 175 mg/dL — ABNORMAL HIGH (ref 70–99)
Potassium: 3.9 mmol/L (ref 3.5–5.1)
Sodium: 136 mmol/L (ref 135–145)

## 2024-02-18 LAB — CBC
HCT: 25.1 % — ABNORMAL LOW (ref 36.0–46.0)
Hemoglobin: 7.7 g/dL — ABNORMAL LOW (ref 12.0–15.0)
MCH: 23.8 pg — ABNORMAL LOW (ref 26.0–34.0)
MCHC: 30.7 g/dL (ref 30.0–36.0)
MCV: 77.5 fL — ABNORMAL LOW (ref 80.0–100.0)
Platelets: 414 K/uL — ABNORMAL HIGH (ref 150–400)
RBC: 3.24 MIL/uL — ABNORMAL LOW (ref 3.87–5.11)
RDW: 16.7 % — ABNORMAL HIGH (ref 11.5–15.5)
WBC: 13.4 K/uL — ABNORMAL HIGH (ref 4.0–10.5)
nRBC: 0.3 % — ABNORMAL HIGH (ref 0.0–0.2)

## 2024-02-18 LAB — GLUCOSE, CAPILLARY
Glucose-Capillary: 173 mg/dL — ABNORMAL HIGH (ref 70–99)
Glucose-Capillary: 178 mg/dL — ABNORMAL HIGH (ref 70–99)
Glucose-Capillary: 148 mg/dL — ABNORMAL HIGH (ref 70–99)
Glucose-Capillary: 170 mg/dL — ABNORMAL HIGH (ref 70–99)
Glucose-Capillary: 179 mg/dL — ABNORMAL HIGH (ref 70–99)

## 2024-02-18 MED ORDER — INSULIN ASPART 100 UNIT/ML IJ SOLN
0.0000 [IU] | Freq: Three times a day (TID) | INTRAMUSCULAR | Status: DC
  Administered 2024-02-18: 2 [IU] via SUBCUTANEOUS
  Administered 2024-02-18: 1 [IU] via SUBCUTANEOUS
  Administered 2024-02-19: 2 [IU] via SUBCUTANEOUS
  Administered 2024-02-19: 1 [IU] via SUBCUTANEOUS

## 2024-02-18 NOTE — Assessment & Plan Note (Signed)
 Body mass index is 38.83 kg/m.Evelyn Watts  Weight loss should be encouraged on an ongoing basis Resume semaglutide  at the time of dc Outpatient PCP/bariatric medicine f/u encouraged Significantly low or high BMI is associated with higher medical risk including morbidity and mortality

## 2024-02-18 NOTE — Plan of Care (Signed)
  Problem: Education: Goal: Knowledge of General Education information will improve Description: Including pain rating scale, medication(s)/side effects and non-pharmacologic comfort measures Outcome: Progressing   Problem: Clinical Measurements: Goal: Diagnostic test results will improve Outcome: Progressing   Problem: Safety: Goal: Ability to remain free from injury will improve Outcome: Progressing   Problem: Activity: Goal: Risk for activity intolerance will decrease Outcome: Adequate for Discharge   Problem: Nutrition: Goal: Adequate nutrition will be maintained Outcome: Adequate for Discharge   Problem: Pain Managment: Goal: General experience of comfort will improve and/or be controlled Outcome: Adequate for Discharge

## 2024-02-18 NOTE — Assessment & Plan Note (Signed)
 Baseline around 130 Normal on BMP on 10/24 Recheck in AM

## 2024-02-18 NOTE — Assessment & Plan Note (Addendum)
 Presented with fever to 101, tachycardia, leukocytosis due to R thigh necrotizing fasciitis General Surgery consulted Underwent I&D on 10/19 and 10/22 Has R medial thigh wound vac Continue broad-spectrum antibiotics including ceftriaxone , linezolid with plan to continue until WBC normalizes per surgery (currently 13.4, improving) Wound vac change Monday and then possibly home Pain control with Tylenol , oxy, Dilaudid 

## 2024-02-18 NOTE — Assessment & Plan Note (Signed)
Resume lisinopril

## 2024-02-18 NOTE — Assessment & Plan Note (Addendum)
 Last A1c 11.6, very poor control Her husband reports markedly improved control while here and wants to continue this regimen at the time of dc; will plan to add Novolog  Flexpens Currently on Semglee  20 units BID with moderate-scale SSI Will request DM coordinator consult Continue gabapentin 

## 2024-02-18 NOTE — Assessment & Plan Note (Addendum)
 Baseline anemia of chronic disease (Hgb 10-11) Now with superimposed ABLA Hold IV iron given acute illness/contraindications - initiated p.o. iron supplementation Will need eventual repeat outpatient iron panel Transfused 1 unit PRBC on 10/24 Hgb up to 7.7 today Recheck CBC in AM with plan to transfuse for Hgb <7

## 2024-02-18 NOTE — Progress Notes (Signed)
 Progress Note   Patient: Evelyn Watts FMW:[id number] DOB: 11/12/1975 DOA: 02/10/2024     7 DOS: the patient was seen and examined on 02/18/2024   Brief hospital course: 48yo with h/o DM, HTN, chronic hyponatremia (baseline 128-133), and anemia of chronic disease (baseline 10-11) who presented on 10/18 with R thigh pain, found to have necrotizing fasciitis.  Underwent OR debridement on 10/19, 10/22.  Has wound vac.  On IV Ceftriaxone  and Linezolid.  Also with worsening anemia s/p transfusion of 1 unit PRBC.  Surgery planning for wound vac change on 10/27 and then dc to home.  Assessment and Plan:   Assessment & Plan Sepsis due to cellulitis [facility]) Presented with fever to 101, tachycardia, leukocytosis due to R thigh necrotizing fasciitis General Surgery consulted Underwent I&D on 10/19 and 10/22 Has R medial thigh wound vac Continue broad-spectrum antibiotics including ceftriaxone , linezolid with plan to continue until WBC normalizes per surgery (currently 13.4, improving) Wound vac change Monday and then possibly home Pain control with Tylenol , oxy, Dilaudid  Acute on chronic anemia Baseline anemia of chronic disease (Hgb 10-11) Now with superimposed ABLA Hold IV iron given acute illness/contraindications - initiated p.o. iron supplementation Will need eventual repeat outpatient iron panel Transfused 1 unit PRBC on 10/24 Hgb up to 7.7 today Recheck CBC in AM with plan to transfuse for Hgb <7 History of essential hypertension Resume lisinopril   Chronic hyponatremia Baseline around 130 Normal on BMP on 10/24 Recheck in AM DM2 (diabetes mellitus, type 2) (HCC) Last A1c 11.6, very poor control Her husband reports markedly improved control while here and wants to continue this regimen at the time of dc; will plan to add Novolog  Flexpens Currently on Semglee  20 units BID with moderate-scale SSI Will request DM coordinator consult Continue gabapentin  Dyslipidemia, goal LDL below  70 Continue rosuvastatin   Class 2 obesity due to excess calories with body mass index (BMI) of 35.0 to 35.9 in adult Body mass index is 38.83 kg/m.[name]  Weight loss should be encouraged on an ongoing basis Resume semaglutide  at the time of dc Outpatient PCP/bariatric medicine f/u encouraged Significantly low or high BMI is associated with higher medical risk including morbidity and mortality      Consultants: Surgery TOC team   Procedures: I&D of abscess 10/19 and 10/22   Antibiotics: Unasyn 10/23- Ceftriaxone  10/18-23 Linezolid 10/19-23 Vancomycin  x 1  30 Day Unplanned Readmission Risk Score    Flowsheet Row ED to Hosp-Admission (Current) from 02/10/2024 in [facility] 4TH FLOOR PROGRESSIVE CARE AND UROLOGY  30 Day Unplanned Readmission Risk Score (%) 12.85 Filed at 02/18/2024 1600    This score is the patient's risk of an unplanned readmission within 30 days of being discharged (0 -100%). The score is based on dignosis, age, lab data, medications, orders, and past utilization.   Low:  0-14.9   Medium: 15-21.9   High: 22-29.9   Extreme: 30 and above           Subjective: Feeling better today, more awake and alert.  IV narcotics have been stopped.   Objective: Vitals:   02/18/24 0439 02/18/24 1400  BP: 128/71 (!) 166/98  Pulse: 83 87  Resp: 16 20  Temp: 98.5 F (36.9 C) 97.9 F (36.6 C)  SpO2: 97% 96%    Intake/Output Summary (Last 24 hours) at 02/18/2024 1642 Last data filed at 02/18/2024 1404 Gross per 24 hour  Intake 940 ml  Output 100 ml  Net 840 ml   Filed Weights   02/14/24 1334  02/17/24 0500 02/18/24 0500  Weight: 117 kg 126.3 kg 129.4 kg    Exam:  General:  Appears calm and comfortable and is in NAD Eyes:  normal lids, iris ENT:  grossly normal hearing, lips & tongue, mmm Cardiovascular:  RRR. No LE edema.  Respiratory:   CTA bilaterally with no wheezes/rales/rhonchi.  Normal respiratory effort. Abdomen:  soft, NT, ND Skin:  R medial  thigh wound vac in place Musculoskeletal:  grossly normal tone BUE/BLE, no bony abnormality Psychiatric:  somnolent mood and affect, speech sparse but appropriate, AOx3 Neurologic:  CN 2-12 grossly intact, moves all extremities in coordinated fashion  Data Reviewed: I have reviewed the patient's lab results since admission.  Pertinent labs for today include:   Glucose 175 WBC 13.4, improving Hgb 7.7, stable Platelets 414 Wound culture Enterococcus faecalis     Family Communication: Husband present  Mobility: PT/OT Consulted      Code Status: Full Code    Disposition: Status is: Inpatient Remains inpatient appropriate because: ongoing management     Time spent: 50 minutes  Unresulted Labs (From admission, onward)    None        Author: Delon Herald, MD 02/18/2024 4:42 PM  For on call review [email or web address].

## 2024-02-18 NOTE — Inpatient Diabetes Management (Addendum)
 Inpatient Diabetes Program Recommendations  AACE/ADA: New Consensus Statement on Inpatient Glycemic Control   Target Ranges:  Prepandial:   less than 140 mg/dL      Peak postprandial:   less than 180 mg/dL (1-2 hours)      Critically ill patients:  140 - 180 mg/dL    Latest Reference Range & Units 02/17/24 00:21 02/17/24 06:13 02/17/24 10:04 02/17/24 11:43 02/17/24 16:28 02/17/24 17:35 02/17/24 21:47 02/17/24 23:24 02/18/24 05:43  Glucose-Capillary 70 - 99 mg/dL 790 (H)  Novolog  3 units 142 (H)  Novolog  1 units     Semglee  20 units  100% of breakfast 182 (H)  Novolog  2 units    100% of lunch        Glucerna 237 ml 195 (H)  Novolog  2 units    100% of supper     Semglee  20 units  Glucerna 237 ml 227 (H)  Novolog  3 units 173 (H)  Novolog  2 units   Review of Glycemic Control  Diabetes history: DM2 Outpatient Diabetes medications: Lantus  30 units BID, Metformin  1000 mg BID, Ozempic  1 mg Qweek Current orders for Inpatient glycemic control: Semglee  20 units BID, Novolog  0-9 units Q6H   Recommendation:   Inpatient DM: May want to consider changing CBGs to AC&HS, and change Novolog  0-9 units to AC&HS. Per chart, patient is eating 100% of meals. May want to re-evaluate if Glucerna supplements TID are still needed.  Outpatient DM: May want to consider adding short acting insulin  to outpatient DM medication regimen. IF so, please provide Rx for Novolog  Flexpens (#[id number]).    NOTE: Noted consult for Diabetes Coordinator for A1C >10. Diabetes Coordinator is not on campus over the weekend but available by pager from 8am to 5pm for questions or concerns. Inpatient diabetes coordinator spoke with patient at length on 02/13/24 regarding DM, A1C, importance of improving DM control especially for healing, and short acting insulin  was discussed in case patient was discharged on it. CBGs today 142-182 mg/dl. Will follow along and make recommendations if needed.    Thanks, Earnie Gainer, RN, MSN, CDCES Diabetes Coordinator Inpatient Diabetes Program [phone] (Team Pager from 8am to 5pm)

## 2024-02-18 NOTE — Assessment & Plan Note (Signed)
 Continue rosuvastatin.

## 2024-02-19 ENCOUNTER — Other Ambulatory Visit (HOSPITAL_BASED_OUTPATIENT_CLINIC_OR_DEPARTMENT_OTHER): Payer: Self-pay

## 2024-02-19 ENCOUNTER — Other Ambulatory Visit (HOSPITAL_COMMUNITY): Payer: Self-pay

## 2024-02-19 DIAGNOSIS — L0390 Cellulitis, unspecified: Secondary | ICD-10-CM

## 2024-02-19 DIAGNOSIS — A419 Sepsis, unspecified organism: Secondary | ICD-10-CM | POA: Diagnosis not present

## 2024-02-19 LAB — TYPE AND SCREEN
ABO/RH(D): A POS
Antibody Screen: NEGATIVE
Unit division: 0

## 2024-02-19 LAB — CBC
HCT: 26.5 % — ABNORMAL LOW (ref 36.0–46.0)
Hemoglobin: 8.1 g/dL — ABNORMAL LOW (ref 12.0–15.0)
MCH: 24 pg — ABNORMAL LOW (ref 26.0–34.0)
MCHC: 30.6 g/dL (ref 30.0–36.0)
MCV: 78.6 fL — ABNORMAL LOW (ref 80.0–100.0)
Platelets: 421 K/uL — ABNORMAL HIGH (ref 150–400)
RBC: 3.37 MIL/uL — ABNORMAL LOW (ref 3.87–5.11)
RDW: 16.9 % — ABNORMAL HIGH (ref 11.5–15.5)
WBC: 9.6 K/uL (ref 4.0–10.5)
nRBC: 0.2 % (ref 0.0–0.2)

## 2024-02-19 LAB — AEROBIC/ANAEROBIC CULTURE W GRAM STAIN (SURGICAL/DEEP WOUND)

## 2024-02-19 LAB — GLUCOSE, CAPILLARY
Glucose-Capillary: 156 mg/dL — ABNORMAL HIGH (ref 70–99)
Glucose-Capillary: 125 mg/dL — ABNORMAL HIGH (ref 70–99)

## 2024-02-19 LAB — BPAM RBC
Blood Product Expiration Date: 202511152359
ISSUE DATE / TIME: 202510242323
Unit Type and Rh: 6200

## 2024-02-19 MED ORDER — BLOOD GLUCOSE TEST VI STRP
1.0000 | ORAL_STRIP | 0 refills | Status: DC
  Filled 2024-02-19: qty 100, 25d supply, fill #0

## 2024-02-19 MED ORDER — ACETAMINOPHEN 500 MG PO TABS: 1000.0000 mg | ORAL_TABLET | Freq: Four times a day (QID) | ORAL | Status: AC | PRN

## 2024-02-19 MED ORDER — LANCET DEVICE MISC
1.0000 | 0 refills | Status: DC
  Filled 2024-02-19: qty 1, fill #0

## 2024-02-19 MED ORDER — METHOCARBAMOL 500 MG PO TABS
500.0000 mg | ORAL_TABLET | Freq: Four times a day (QID) | ORAL | 0 refills | Status: AC | PRN
  Filled 2024-02-19: qty 30, 8d supply, fill #0

## 2024-02-19 MED ORDER — LANCETS MISC
1.0000 | 0 refills | Status: DC
  Filled 2024-02-19: qty 100, 25d supply, fill #0

## 2024-02-19 MED ORDER — OXYCODONE HCL 5 MG PO TABS
5.0000 mg | ORAL_TABLET | ORAL | 0 refills | Status: AC | PRN
  Filled 2024-02-19: qty 30, 4d supply, fill #0

## 2024-02-19 MED ORDER — PEN NEEDLES 32G X 4 MM MISC
3 refills | Status: DC
  Filled 2024-02-19: qty 200, 90d supply, fill #0

## 2024-02-19 MED ORDER — INSULIN ASPART 100 UNIT/ML FLEXPEN
0.0000 [IU] | PEN_INJECTOR | Freq: Three times a day (TID) | SUBCUTANEOUS | 0 refills | Status: DC
  Filled 2024-02-19: qty 15, 90d supply, fill #0

## 2024-02-19 MED ORDER — BLOOD GLUCOSE MONITOR SYSTEM W/DEVICE KIT
1.0000 | PACK | 0 refills | Status: AC
  Filled 2024-02-19: qty 1, 30d supply, fill #0

## 2024-02-19 MED ORDER — OXYCODONE HCL 5 MG PO TABS: 5.0000 mg | ORAL_TABLET | ORAL | Status: DC | PRN

## 2024-02-19 MED ORDER — INSULIN GLARGINE SOLOSTAR 100 UNIT/ML ~~LOC~~ SOPN
20.0000 [IU] | PEN_INJECTOR | Freq: Two times a day (BID) | SUBCUTANEOUS | 11 refills | Status: DC
  Filled 2024-02-19: qty 15, 38d supply, fill #0

## 2024-02-19 NOTE — Assessment & Plan Note (Addendum)
 Baseline around 130 Currently normal

## 2024-02-19 NOTE — Assessment & Plan Note (Addendum)
 Baseline anemia of chronic disease (Hgb 10-11) Now with superimposed ABLA Hold IV iron given acute illness/contraindications - initiated p.o. iron supplementation Will need eventual repeat outpatient iron panel Transfused 1 unit PRBC on 10/24 Hgb up to 8.1 today Resume home PO iron supplementation

## 2024-02-19 NOTE — Consult Note (Signed)
 WOC Nurse wound follow up Wound type: surgical  Measurement:20cm x 4cm x 4.5cm  Wound azi:rozjw; minimal fibrinous material  Drainage (amount, consistency, odor) serosanguinous in VAC canister Periwound: intact some mild skin peeing at right lateral edge of wound bed  Dressing procedure/placement/frequency: Removed old NPWT dressing using Coloplast releaser spray (left in room for patient to take home for use)  Moistened foam with normal saline Filled wound with ___1 piece of black foam; cut as a cinnamon roll to allow for use of only one piece  Sealed NPWT dressing at HG Patient received PO pain medication per bedside nurse prior to dressing change Patient tolerated procedure well; however CCS PA to adjust pain med regimen for dressing change. Quite painful with foam removal; especially at the upper lateral aspect of the wound bed.  WOC nurse will continue to provide NPWT dressing changed due to the complexity of the dressing change.   Planning for [facility] and home VAC; canister ordered for bedside nurse to be taken to the room; may need to be change before patient is DC.   Will FU for Thursday change if the patient has not been DCed.   Sabrina Arriaga [facility], CNS, CWON-AP [phone]

## 2024-02-19 NOTE — Plan of Care (Signed)

## 2024-02-19 NOTE — Progress Notes (Signed)
 5 Days Post-Op   Subjective/Chief Complaint: No issues   Objective: Vital signs in last 24 hours: Temp:  [97.9 F (36.6 C)-98.3 F (36.8 C)] 98.3 F (36.8 C) (10/27 0449) Pulse Rate:  [73-87] 73 (10/27 0449) Resp:  [16-20] 16 (10/27 0449) BP: (132-166)/(72-98) 132/82 (10/27 0449) SpO2:  [96 %-98 %] 98 % (10/27 0449) Weight:  [128.6 kg] 128.6 kg (10/27 0500) Last BM Date : 02/18/24  Intake/Output from previous day: 10/26 0701 - 10/27 0700 In: 1780 [P.O.:900; IV Piggyback:880] Out: -  Intake/Output this shift: Total I/O In: 120 [P.O.:120] Out: -   General appearance: alert and cooperative Resp: clear to auscultation bilaterally Cardio: regular rate and rhythm Extremities: wound vac in place. No cellulitis  Lab Results:  Recent Labs    02/18/24 0556 02/19/24 0518  WBC 13.4* 9.6  HGB 7.7* 8.1*  HCT 25.1* 26.5*  PLT 414* 421*   BMET Recent Labs    02/18/24 0556  NA 136  K 3.9  CL 100  CO2 28  GLUCOSE 175*  BUN 5*  CREATININE 0.45  CALCIUM  8.9   PT/INR No results for input(s): LABPROT, INR in the last 72 hours. ABG No results for input(s): PHART, HCO3 in the last 72 hours.  Invalid input(s): PCO2, PO2  Studies/Results: No results found.  Anti-infectives: Anti-infectives (From admission, onward)    Start     Dose/Rate Route Frequency Ordered Stop   02/15/24 1400  Ampicillin-Sulbactam (UNASYN) 3 g in sodium chloride  0.9 % 100 mL IVPB        3 g 200 mL/hr over 30 Minutes Intravenous Every 6 hours 02/15/24 1252     02/12/24 1600  cefTRIAXone  (ROCEPHIN ) 2 g in sodium chloride  0.9 % 100 mL IVPB  Status:  Discontinued        2 g 200 mL/hr over 30 Minutes Intravenous Every 24 hours 02/12/24 1450 02/15/24 1252   02/11/24 1600  cefTRIAXone  (ROCEPHIN ) 1 g in sodium chloride  0.9 % 100 mL IVPB  Status:  Discontinued        1 g 200 mL/hr over 30 Minutes Intravenous Every 24 hours 02/11/24 0127 02/12/24 1450   02/11/24 1200  linezolid (ZYVOX) IVPB  600 mg  Status:  Discontinued        600 mg 300 mL/hr over 60 Minutes Intravenous Every 12 hours 02/11/24 0127 02/15/24 1252   02/10/24 2245  cefTRIAXone  (ROCEPHIN ) 2 g in sodium chloride  0.9 % 100 mL IVPB        2 g 200 mL/hr over 30 Minutes Intravenous Once 02/10/24 2231 02/10/24 2322   02/10/24 2245  vancomycin  (VANCOCIN ) IVPB 1000 mg/200 mL premix        1,000 mg 200 mL/hr over 60 Minutes Intravenous  Once 02/10/24 2231 02/11/24 0131       Assessment/Plan: s/p Procedure(s): IRRIGATION AND DEBRIDEMENT ABSCESS (Right) Advance diet WBC normal [facility] surgery team at time of vac change Home after vac change and home needs arranged   LOS: 8 days    Deward PARAS Laterica Matarazzo 02/19/2024

## 2024-02-19 NOTE — Assessment & Plan Note (Addendum)
 Presented with fever to 101, tachycardia, leukocytosis due to R thigh necrotizing fasciitis General Surgery consulted Underwent I&D on 10/19 and 10/22 Has R medial thigh wound vac Continue broad-spectrum antibiotics including ceftriaxone , linezolid -> Unasyn WBC has normalized, does not appear to need ongoing antibiotics at this time Wound vac changed today, will continue with wound vac with HHRN Pain control with Tylenol , oxy, Dilaudid 

## 2024-02-19 NOTE — Progress Notes (Signed)
 Discharge meds in a secure bag delivered to patient and husband in room - pt has long acting insulin  & testing supplies at home from 02/10/24 - too soon to refill.

## 2024-02-19 NOTE — Progress Notes (Signed)
 Seen with WOC RN for [facility] change  Wound overall granulating well as pictured below    Ok to DC home with [facility] VAC from surgery standpoint - will arrange follow up for wound check in office for ~3 weeks.   Burnard JONELLE Louder, [facility] Surgery 02/19/2024, 9:43 AM Please see Amion for pager number during day hours 7:00am-4:30pm

## 2024-02-19 NOTE — TOC CM/SW Note (Signed)
    Durable Medical Equipment  (From admission, onward)           Start     Ordered   02/19/24 1157  For home use only DME Tub bench  Once        02/19/24 1156

## 2024-02-19 NOTE — Assessment & Plan Note (Signed)
Resume lisinopril

## 2024-02-19 NOTE — Discharge Summary (Signed)
 Physician Discharge Summary   Patient: Evelyn Watts MRN: [id number] DOB: [birth year]  Admit date:     02/10/2024  Discharge date: 02/19/24  Discharge Physician: Delon Herald   PCP: Evelyn Bascom RAMAN, NP   Recommendations at discharge:   You are being discharged with a wound vac with home health nursing assistance. Follow up with surgery on 10/28 as scheduled Follow up with NP Evelyn in 1-2 weeks  Discharge Diagnoses: Principal Problem:   Sepsis due to cellulitis [facility]) Active Problems:   Acute on chronic anemia   Chronic hyponatremia   DM2 (diabetes mellitus, type 2) (HCC)   History of essential hypertension   Class 2 obesity due to excess calories with body mass index (BMI) of 35.0 to 35.9 in adult   Dyslipidemia, goal LDL below 70    Hospital Course: 48yo with h/o DM, HTN, chronic hyponatremia (baseline 128-133), and anemia of chronic disease (baseline 10-11) who presented on 10/18 with R thigh pain, found to have necrotizing fasciitis.  Underwent OR debridement on 10/19, 10/22.  Has wound vac.  On IV Ceftriaxone  and Linezolid.  Also with worsening anemia s/p transfusion of 1 unit PRBC.  Surgery planning for wound vac change on 10/27 and then dc to home.  Assessment and Plan:  Assessment & Plan Sepsis due to cellulitis [facility]) Presented with fever to 101, tachycardia, leukocytosis due to R thigh necrotizing fasciitis General Surgery consulted Underwent I&D on 10/19 and 10/22 Has R medial thigh wound vac Continue broad-spectrum antibiotics including ceftriaxone , linezolid -> Unasyn WBC has normalized, does not appear to need ongoing antibiotics at this time Wound vac changed today, will continue with wound vac with HHRN Pain control with Tylenol , oxy, Dilaudid  Acute on chronic anemia Baseline anemia of chronic disease (Hgb 10-11) Now with superimposed ABLA Hold IV iron given acute illness/contraindications - initiated p.o. iron supplementation Will need eventual  repeat outpatient iron panel Transfused 1 unit PRBC on 10/24 Hgb up to 8.1 today Resume home PO iron supplementation History of essential hypertension Resume lisinopril   Chronic hyponatremia Baseline around 130 Currently normal DM2 (diabetes mellitus, type 2) (HCC) Last A1c 11.6, very poor control Her husband reports markedly improved control while here and wants to continue this regimen at the time of dc; will plan to add Novolog  Flexpens Currently on Semglee  20 units BID with moderate-scale SSI Resume metformin , semaglutide  DM coordinator consulting Continue gabapentin  Dyslipidemia, goal LDL below 70 Continue rosuvastatin   Class 2 obesity due to excess calories with body mass index (BMI) of 35.0 to 35.9 in adult Body mass index is 38.83 kg/m.[name]  Weight loss should be encouraged on an ongoing basis Resume semaglutide  at the time of dc Outpatient PCP/bariatric medicine f/u encouraged Significantly low or high BMI is associated with higher medical risk including morbidity and mortality     Consultants: Surgery [facility] team   Procedures: I&D of abscess 10/19 and 10/22   Antibiotics: Unasyn 10/23- Ceftriaxone  10/18-23 Linezolid 10/19-23 Vancomycin  x 1   Pain control - [place]  Controlled Substance Reporting System database was reviewed. and patient was instructed, not to drive, operate heavy machinery, perform activities at heights, swimming or participation in water  activities or provide baby-sitting services while on Pain, Sleep and Anxiety Medications; until their outpatient Physician has advised to do so again. Also recommended to not to take more than prescribed Pain, Sleep and Anxiety Medications.   Disposition: Home Diet recommendation:  Carb modified diet DISCHARGE MEDICATION: Allergies as of 02/19/2024       Reactions  Latex Rash   Morphine And Codeine  Rash   Tape Rash        Medication List     STOP taking these medications    Accu-Chek Guide  Test test strip Generic drug: glucose blood Replaced by: BLOOD GLUCOSE TEST STRIPS Strp   cyclobenzaprine  5 MG tablet Commonly known as: FLEXERIL    FreeStyle Libre 3 Plus Sensor Misc   Glucerna 1.0 Cal Liqd   IBU 600 MG tablet Generic drug: ibuprofen    insulin  glargine 100 UNIT/ML Solostar Pen Commonly known as: LANTUS        TAKE these medications    acetaminophen  500 MG tablet Commonly known as: TYLENOL  Take 2 tablets (1,000 mg total) by mouth every 6 (six) hours as needed for mild pain (pain score 1-3), fever or headache.   albuterol  108 (90 Base) MCG/ACT inhaler Commonly known as: Ventolin  HFA Inhale 2 puffs into the lungs every 4 (four) hours as needed for wheezing.   Blood Glucose Monitoring Suppl Devi 1 each by Does not apply route as directed. Dispense based on patient and insurance preference. Use up to four times daily as directed. (FOR ICD-10 E10.9, E11.9).   BLOOD GLUCOSE TEST STRIPS Strp 1 each by Does not apply route as directed. Dispense based on patient and insurance preference. Use up to four times daily as directed. (FOR ICD-10 E10.9, E11.9). Replaces: Accu-Chek Guide Test test strip   cetirizine  10 MG tablet Commonly known as: ZYRTEC  Take 1 tablet (10 mg total) by mouth daily.   Ferrous Sulfate  90 (18 Fe) MG Tabs Take 1 tablet by mouth daily.   gabapentin  300 MG capsule Commonly known as: NEURONTIN  TAKE 1 CAPSULE THREE TIMES DAILY   insulin  aspart 100 UNIT/ML FlexPen Commonly known as: NOVOLOG  Inject 0-6 Units into the skin 3 (three) times daily with meals. Check Blood Glucose (BG) and inject per scale: BG <150= 0 unit; BG 150-200= 1 unit; BG 201-250= 2 unit; BG 251-300= 3 unit; BG 301-350= 4 unit; BG 351-400= 5 unit; BG >400= 6 unit and Call Primary Care.   insulin  glargine-yfgn 100 UNIT/ML injection Commonly known as: SEMGLEE  Inject 0.2 mLs (20 Units total) into the skin 2 (two) times daily.   Lancet Device Misc 1 each by Does not apply  route as directed. Dispense based on patient and insurance preference. Use up to four times daily as directed. (FOR ICD-10 E10.9, E11.9).   Lancets Misc 1 each by Does not apply route as directed. Dispense based on patient and insurance preference. Use up to four times daily as directed. (FOR ICD-10 E10.9, E11.9). What changed:  how much to take how to take this when to take this additional instructions   lisinopril  20 MG tablet Commonly known as: ZESTRIL  Take 1 tablet (20 mg total) by mouth daily.   metFORMIN  500 MG 24 hr tablet Commonly known as: GLUCOPHAGE -XR Take 2 tablets (1,000 mg total) by mouth 2 (two) times daily with a meal.   methocarbamol  500 MG tablet Commonly known as: ROBAXIN  Take 1 tablet (500 mg total) by mouth every 6 (six) hours as needed for muscle spasms.   naproxen  500 MG tablet Commonly known as: NAPROSYN  TAKE 1 TABLET TWICE DAILY WITH MEALS   oxyCODONE  5 MG immediate release tablet Commonly known as: Oxy IR/ROXICODONE  Take 1-3 tablets (5-15 mg total) by mouth every 4 (four) hours as needed for moderate pain (pain score 4-6) or severe pain (pain score 7-10) (5mg  for moderate pain, 10mg  for severe pain; 15 mg for  dressing changes).   Pen Needles 32G X 4 MM Misc Use as directed to inject insulin  twice daily.   rosuvastatin  40 MG tablet Commonly known as: Crestor  Take 1 tablet (40 mg total) by mouth daily.   Semaglutide  (1 MG/DOSE) 4 MG/3ML Sopn Inject 1 mg into the skin once a week.               Durable Medical Equipment  (From admission, onward)           Start     Ordered   02/19/24 1157  For home use only DME Tub bench  Once        02/19/24 1156              Discharge Care Instructions  (From admission, onward)           Start     Ordered   02/19/24 0000  Discharge wound care:       Comments: Negative pressure wound therapy  Every Mon & Thur 10:00    Comments: WOC will change Vac Q Mon and Thurs: Use 2 pieces black  foam and barrier ring at the lower edge Amount of suction? 125 mm/Hg   Suction Type? Continuous   02/19/24 1212            Contact information for follow-up providers     Maczis, Puja Gosai, PA-C. Go on 03/18/2024.   Specialty: General Surgery Why: 11/24 at 1:45PM. Please bring a copy of your photo ID, insurance card and arrive 30 minutes prior to your appointment for paperwork. Please bring VAC supplies with you as we do not keep these in our office Contact information: [address] [place] [address] [phone]         [facility]  Follow up.   Why: Negative Pressure Wound  Therapy-awaiting all info prior auth;3n1 Contact information: [address] [place] [address] [phone]         Evelyn Bascom RAMAN, NP Follow up in 1 week(s).   Specialties: Pulmonary Disease, Endocrinology Contact information: [address] [place] [address] [phone]              Contact information for after-discharge care     Durable Medical Equipment     [facility] .   Service: Durable Medical Equipment Contact information: [address] [place]  [address] [phone]             Home Medical Care     [facility]) .   Service: Home Health Services Why: [facility] [place] [facility] Contact information: [address] [place]  [address] [phone]                    Discharge Exam:    Subjective: Feeling good, eager to go home today.   Objective: Vitals:   02/18/24 2042 02/19/24 0449  BP: (!) 148/72 132/82  Pulse: 81 73  Resp: 19 16  Temp: 98.1 F (36.7 C) 98.3 F (36.8 C)  SpO2: 97% 98%    Intake/Output Summary (Last 24 hours) at 02/19/2024 1212 Last data filed at 02/19/2024 1203 Gross per 24 hour  Intake 1900 ml  Output --  Net 1900 ml   Filed Weights   02/17/24 0500 02/18/24 0500 02/19/24  0500  Weight: 126.3 kg 129.4 kg 128.6 kg    Exam:  General:  Appears calm and comfortable and  is in NAD Eyes:  normal lids, iris ENT:  grossly normal hearing, lips & tongue, mmm Cardiovascular:  RRR. No LE edema.  Respiratory:   CTA bilaterally with no wheezes/rales/rhonchi.  Normal respiratory effort. Abdomen:  soft, NT, ND Skin:  R medial thigh wound vac in place Musculoskeletal:  grossly normal tone BUE/BLE, no bony abnormality Psychiatric:  somnolent mood and affect, speech sparse but appropriate, AOx3 Neurologic:  CN 2-12 grossly intact, moves all extremities in coordinated fashion  Data Reviewed: I have reviewed the patient's lab results since admission.  Pertinent labs for today include:  WBC 9.6, improved Hgb 8.1, stable Platelets 421     Condition at discharge: improving  The results of significant diagnostics from this hospitalization (including imaging, microbiology, ancillary and laboratory) are listed below for reference.   Imaging Studies: DG Chest Port 1 View Result Date: 02/11/2024 EXAM: 1 VIEW(S) XRAY OF THE CHEST 02/11/2024 12:45:00 PM COMPARISON: Chest x-ray dated 04/23/2021 CLINICAL HISTORY: Cough. FINDINGS: LUNGS AND PLEURA: Hypoventilatory changes. No focal pulmonary opacity. No pulmonary edema. No pleural effusion. No pneumothorax. HEART AND MEDIASTINUM: Borderline cardiac enlargement. BONES AND SOFT TISSUES: No acute osseous abnormality. IMPRESSION: 1. No acute cardiopulmonary process. 2. Borderline cardiomegaly. Electronically signed by: Luke Bun MD 02/11/2024 03:18 PM EDT RP Workstation: [id number]   CT FEMUR RIGHT W CONTRAST Result Date: 02/11/2024 EXAM: CT OF THE RIGHT FEMUR, WITH IV CONTRAST 02/10/2024 11:55:36 PM TECHNIQUE: Axial images were acquired through the right femur with IV contrast. Reformatted images were reviewed. Automated exposure control, iterative reconstruction, and/or weight based adjustment of the mA/kV was utilized to reduce  the radiation dose to as low as reasonably achievable. COMPARISON: None available. CLINICAL HISTORY: Soft tissue infection, sepsis. FINDINGS: BONES: No acute fracture or focal osseous lesion. JOINTS: Tricompartmental moderate degenerative changes of the knee. No knee joint effusion. No hip joint effusion. SOFT TISSUES: Medial proximal mid thigh subcutaneous soft tissue edema. No findings to suggest deep fascial plane soft tissue edema. Question couple of foci of gas associated with the edema (11: 189). Associated overlying dermal thickening. No organized fluid collection. No right inguinal lymphadenopathy. VASCULATURE: Atherosclerotic plaque. IMPRESSION: 1. Medial proximal mid thigh subcutaneous soft tissue edema with possible associated trace foci of gas. Findings suggestive of cellulitis with necrotizing fasciitis not excluded as this is a clinical diagnosis. 2. No organized fluid collection. 3. No findings to suggest deep fascial plane soft tissue edema. Electronically signed by: Morgane Naveau MD 02/11/2024 12:09 AM EDT RP Workstation: [id number]    Microbiology: Results for orders placed or performed during the hospital encounter of 02/10/24  Blood culture (routine x 2)     Status: None   Collection Time: 02/10/24 10:50 PM   Specimen: BLOOD RIGHT FOREARM  Result Value Ref Range Status   Specimen Description   Final    BLOOD RIGHT FOREARM Performed at [facility], [address] [place], [place] [address]    Special Requests   Final    BOTTLES DRAWN AEROBIC ONLY Blood Culture results may not be optimal due to an inadequate volume of blood received in culture bottles Performed at [facility], [address] [place], [place] [address]    Culture   Final    NO GROWTH 5 DAYS Performed at [facility], [address] [place], [place] [address]    Report Status 02/16/2024 FINAL  Final  Surgical PCR screen     Status: Abnormal   Collection Time: 02/11/24  5:16  AM   Specimen: Nasal Mucosa; Nasal Swab  Result Value Ref Range Status   MRSA, PCR NEGATIVE NEGATIVE Final   Staphylococcus aureus POSITIVE (A) NEGATIVE Final    Comment: RESULT CALLED TO, READ BACK BY AND VERIFIED WITH: DICKIE, A RN @ 1839 02/11/24 CAL (NOTE) The Xpert SA Assay (FDA approved for NASAL specimens in patients 72 years of age and older), is one component of a comprehensive surveillance program. It is not intended to diagnose infection nor to guide or monitor treatment. Performed at [facility], [address], [place], [place] [address]   Aerobic/Anaerobic Culture w Gram Stain (surgical/deep wound)     Status: None   Collection Time: 02/11/24  8:31 AM   Specimen: Path fluid; Tissue  Result Value Ref Range Status   Specimen Description   Final    FLUID Performed at [facility], [address] [place], [place] [address]    Special Requests   Final    NONE Performed at [facility], [address], [place], [place] [address]    Gram Stain   Final    NO WBC SEEN FEW GRAM POSITIVE COCCI MODERATE GRAM NEGATIVE RODS Performed at [facility], [address], [place], [place] [address]    Culture   Final    FEW ENTEROCOCCUS FAECALIS FEW STREPTOCOCCUS ANGINOSIS CALL MICROBIOLOGY LAB IF SENSITIVITIES ARE REQUIRED. MIXED ANAEROBIC FLORA PRESENT.  CALL LAB IF FURTHER IID REQUIRED.    Report Status 02/16/2024 FINAL  Final   Organism ID, Bacteria ENTEROCOCCUS FAECALIS  Final      Susceptibility   Enterococcus faecalis - MIC*    AMPICILLIN <=2 SENSITIVE Sensitive     VANCOMYCIN  1 SENSITIVE Sensitive     GENTAMICIN SYNERGY SENSITIVE Sensitive     * FEW ENTEROCOCCUS FAECALIS  Blood culture (routine x 2)     Status: None   Collection Time: 02/11/24 10:28 AM   Specimen: BLOOD LEFT ARM  Result Value Ref Range Status   Specimen Description   Final    BLOOD LEFT ARM Performed at [facility],  [address] [place], [place] [address]    Special Requests   Final    BOTTLES DRAWN AEROBIC AND ANAEROBIC Blood Culture results may not be optimal due to an inadequate volume of blood received in culture bottles Performed at [facility], [address] [place], [place] [address]    Culture   Final    NO GROWTH 5 DAYS Performed at [facility], [address] [place], [place] [address]    Report Status 02/16/2024 FINAL  Final  Aerobic/Anaerobic Culture w Gram Stain (surgical/deep wound)     Status: None   Collection Time: 02/14/24  5:03 PM   Specimen: Path Tissue  Result Value Ref Range Status   Specimen Description   Final    TISSUE Performed at [facility], [address] [place], [place] [address]    Special Requests RIGHT THIGH ABSCESS  Final   Gram Stain   Final    FEW WBC SEEN MODERATE GRAM NEGATIVE RODS FEW GRAM POSITIVE COCCI Performed at [facility], [address] [place], [place] [address]    Culture   Final    MODERATE ENTEROCOCCUS FAECALIS MIXED ANAEROBIC FLORA PRESENT.  CALL LAB IF FURTHER IID REQUIRED.    Report Status 02/19/2024 FINAL  Final   Organism ID, Bacteria ENTEROCOCCUS FAECALIS  Final  Susceptibility   Enterococcus faecalis - MIC*    AMPICILLIN <=2 SENSITIVE Sensitive     VANCOMYCIN  1 SENSITIVE Sensitive     GENTAMICIN SYNERGY SENSITIVE Sensitive     * MODERATE ENTEROCOCCUS FAECALIS    Labs: CBC: Recent Labs  Lab 02/15/24 0626 02/16/24 0526 02/16/24 1155 02/17/24 0559 02/18/24 0556 02/19/24 0518  WBC 16.3* 18.9*  --  16.7* 13.4* 9.6  HGB 7.8* 7.0* 6.9* 7.4* 7.7* 8.1*  HCT 26.2* 22.6* 23.7* 24.1* 25.1* 26.5*  MCV 77.5* 77.4*  --  78.0* 77.5* 78.6*  PLT 516* 423*  --  411* 414* 421*   Basic Metabolic Panel: Recent Labs  Lab 02/13/24 0504 02/14/24 0542 02/15/24 0626 02/16/24 0526 02/18/24 0556  NA 130* 136 134* 135 136  K 3.8 3.7 3.7 3.5 3.9  CL 96* 101 98 100 100  CO2  23 23 25 24 28   GLUCOSE 218* 184* 189* 120* 175*  BUN 17 12 6  5* 5*  CREATININE 0.75 0.61 0.56 0.53 0.45  CALCIUM  8.7* 8.9 8.9 8.4* 8.9   Liver Function Tests: No results for input(s): AST, ALT, ALKPHOS, BILITOT, PROT, ALBUMIN in the last 168 hours. CBG: Recent Labs  Lab 02/18/24 1148 02/18/24 1628 02/18/24 2040 02/19/24 0719 02/19/24 1151  GLUCAP 148* 170* 179* 156* 125*    Discharge time spent: greater than 30 minutes.  Signed: Delon Herald, MD [facility] 02/19/2024

## 2024-02-19 NOTE — Plan of Care (Addendum)
 Patient refused flu vaccination here, said she will get it from her PCP during follow up outpatient.  Problem: Education: Goal: Ability to describe self-care measures that may prevent or decrease complications (Diabetes Survival Skills Education) will improve 02/19/2024 1522 by Rosanne Elspeth HERO, RN Outcome: Adequate for Discharge 02/19/2024 0805 by Rosanne Elspeth HERO, RN Outcome: Progressing Goal: Individualized Educational Video(s) 02/19/2024 1522 by Rosanne Elspeth HERO, RN Outcome: Adequate for Discharge 02/19/2024 0805 by Rosanne Elspeth HERO, RN Outcome: Progressing   Problem: Coping: Goal: Ability to adjust to condition or change in health will improve 02/19/2024 1522 by Rosanne Elspeth HERO, RN Outcome: Adequate for Discharge 02/19/2024 0805 by Rosanne Elspeth HERO, RN Outcome: Progressing   Problem: Fluid Volume: Goal: Ability to maintain a balanced intake and output will improve 02/19/2024 1522 by Rosanne Elspeth HERO, RN Outcome: Adequate for Discharge 02/19/2024 0805 by Rosanne Elspeth HERO, RN Outcome: Progressing   Problem: Health Behavior/Discharge Planning: Goal: Ability to identify and utilize available resources and services will improve 02/19/2024 1522 by Rosanne Elspeth HERO, RN Outcome: Adequate for Discharge 02/19/2024 0805 by Rosanne Elspeth HERO, RN Outcome: Progressing Goal: Ability to manage health-related needs will improve 02/19/2024 1522 by Rosanne Elspeth HERO, RN Outcome: Adequate for Discharge 02/19/2024 0805 by Rosanne Elspeth HERO, RN Outcome: Progressing   Problem: Metabolic: Goal: Ability to maintain appropriate glucose levels will improve 02/19/2024 1522 by Rosanne Elspeth HERO, RN Outcome: Adequate for Discharge 02/19/2024 0805 by Rosanne Elspeth HERO, RN Outcome: Progressing   Problem: Nutritional: Goal: Maintenance of adequate nutrition will improve 02/19/2024 1522 by Rosanne Elspeth HERO, RN Outcome: Adequate for  Discharge 02/19/2024 0805 by Rosanne Elspeth HERO, RN Outcome: Progressing Goal: Progress toward achieving an optimal weight will improve 02/19/2024 1522 by Rosanne Elspeth HERO, RN Outcome: Adequate for Discharge 02/19/2024 0805 by Rosanne Elspeth HERO, RN Outcome: Progressing   Problem: Skin Integrity: Goal: Risk for impaired skin integrity will decrease 02/19/2024 1522 by Rosanne Elspeth HERO, RN Outcome: Adequate for Discharge 02/19/2024 0805 by Rosanne Elspeth HERO, RN Outcome: Progressing   Problem: Tissue Perfusion: Goal: Adequacy of tissue perfusion will improve 02/19/2024 1522 by Rosanne Elspeth HERO, RN Outcome: Adequate for Discharge 02/19/2024 0805 by Rosanne Elspeth HERO, RN Outcome: Progressing   Problem: Education: Goal: Knowledge of General Education information will improve Description: Including pain rating scale, medication(s)/side effects and non-pharmacologic comfort measures 02/19/2024 1522 by Rosanne Elspeth HERO, RN Outcome: Adequate for Discharge 02/19/2024 0805 by Rosanne Elspeth HERO, RN Outcome: Progressing   Problem: Health Behavior/Discharge Planning: Goal: Ability to manage health-related needs will improve 02/19/2024 1522 by Rosanne Elspeth HERO, RN Outcome: Adequate for Discharge 02/19/2024 0805 by Rosanne Elspeth HERO, RN Outcome: Progressing   Problem: Clinical Measurements: Goal: Ability to maintain clinical measurements within normal limits will improve 02/19/2024 1522 by Rosanne Elspeth HERO, RN Outcome: Adequate for Discharge 02/19/2024 0805 by Rosanne Elspeth HERO, RN Outcome: Progressing Goal: Will remain free from infection 02/19/2024 1522 by Rosanne Elspeth HERO, RN Outcome: Adequate for Discharge 02/19/2024 0805 by Rosanne Elspeth HERO, RN Outcome: Progressing Goal: Diagnostic test results will improve 02/19/2024 1522 by Rosanne Elspeth HERO, RN Outcome: Adequate for Discharge 02/19/2024 0805 by Rosanne Elspeth HERO,  RN Outcome: Progressing Goal: Respiratory complications will improve 02/19/2024 1522 by Rosanne Elspeth HERO, RN Outcome: Adequate for Discharge 02/19/2024 0805 by Rosanne Elspeth HERO, RN Outcome: Progressing Goal: Cardiovascular complication will be avoided 02/19/2024 1522 by Rosanne Elspeth HERO, RN Outcome: Adequate for Discharge 02/19/2024 0805 by Rosanne Elspeth HERO, RN Outcome: Progressing   Problem: Activity:  Goal: Risk for activity intolerance will decrease 02/19/2024 1522 by Rosanne Elspeth HERO, RN Outcome: Adequate for Discharge 02/19/2024 0805 by Rosanne Elspeth HERO, RN Outcome: Progressing   Problem: Nutrition: Goal: Adequate nutrition will be maintained 02/19/2024 1522 by Rosanne Elspeth HERO, RN Outcome: Adequate for Discharge 02/19/2024 0805 by Rosanne Elspeth HERO, RN Outcome: Progressing   Problem: Coping: Goal: Level of anxiety will decrease 02/19/2024 1522 by Rosanne Elspeth HERO, RN Outcome: Adequate for Discharge 02/19/2024 0805 by Rosanne Elspeth HERO, RN Outcome: Progressing   Problem: Elimination: Goal: Will not experience complications related to bowel motility 02/19/2024 1522 by Rosanne Elspeth HERO, RN Outcome: Adequate for Discharge 02/19/2024 0805 by Rosanne Elspeth HERO, RN Outcome: Progressing Goal: Will not experience complications related to urinary retention 02/19/2024 1522 by Rosanne Elspeth HERO, RN Outcome: Adequate for Discharge 02/19/2024 0805 by Rosanne Elspeth HERO, RN Outcome: Progressing   Problem: Pain Managment: Goal: General experience of comfort will improve and/or be controlled 02/19/2024 1522 by Rosanne Elspeth HERO, RN Outcome: Adequate for Discharge 02/19/2024 0805 by Rosanne Elspeth HERO, RN Outcome: Progressing   Problem: Safety: Goal: Ability to remain free from injury will improve 02/19/2024 1522 by Rosanne Elspeth HERO, RN Outcome: Adequate for Discharge 02/19/2024 0805 by Rosanne Elspeth HERO,  RN Outcome: Progressing   Problem: Skin Integrity: Goal: Risk for impaired skin integrity will decrease 02/19/2024 1522 by Rosanne Elspeth HERO, RN Outcome: Adequate for Discharge 02/19/2024 0805 by Rosanne Elspeth HERO, RN Outcome: Progressing

## 2024-02-19 NOTE — Assessment & Plan Note (Signed)
 Continue rosuvastatin.

## 2024-02-19 NOTE — Progress Notes (Signed)
 Physical Therapy Evaluation Patient Details Name: Evelyn Watts MRN: [id number] DOB: 02-Sep-1975 Today's Date: 02/19/2024  History of Present Illness  Patient is a 48 year old female who presented to the ED on 02/10/24 with c/o pain in medial right thigh.  Dx with abscess and necrotizing fasciitis secondary to sepsis & cellulitis and underwent I & D on 10/19 and 10/22 with placement of wound vac.  PMHx includes DM II, HTN, HLD, hernia repair, cholecystectomy, depression  Clinical Impression  Pt admitted with above diagnosis. At baseline, pt lives with spouse and is ambulatory with rollator.  Spouse works from home if pt needs assist at d/c.  Today, pt has been mobilizing in room at mod I level.  She was able to ambulate 250' with rollator , CGA, and mild gait deviations that did improve some during session. Discussed using rollator to hold wound vac at home. Pt will benefit from acute PT while hospitalized but likely no PT needs at d/c.  Did discuss if R LE strength not progressing in a few weeks with basic activity could get outpt PT - pt agreeable.   Pt currently with functional limitations due to the deficits listed below (see PT Problem List). Pt will benefit from acute skilled PT to increase their independence and safety with mobility to allow discharge.           If plan is discharge home, recommend the following: A little help with walking and/or transfers;A little help with bathing/dressing/bathroom;Assistance with cooking/housework;Help with stairs or ramp for entrance   Can travel by private vehicle        Equipment Recommendations None recommended by PT  Recommendations for Other Services       Functional Status Assessment Patient has had a recent decline in their functional status and demonstrates the ability to make significant improvements in function in a reasonable and predictable amount of time.     Precautions / Restrictions Precautions Precautions: Other (comment)  (wound vac)      Mobility  Bed Mobility Overal bed mobility: Modified Independent             General bed mobility comments: HOB elevated    Transfers Overall transfer level: Modified independent Equipment used: Rollator (4 wheels)               General transfer comment: Pt has been transferring in room independently using rollator and grab bars; demonstrated safe transfer    Ambulation/Gait Ambulation/Gait assistance: Supervision Gait Distance (Feet): 250 Feet Assistive device: Rollator (4 wheels) Gait Pattern/deviations: Step-to pattern, Decreased stride length, Decreased weight shift to right, Decreased dorsiflexion - right Gait velocity: decreased but functional     General Gait Details: Pt with decreased hip flexion and dorsiflexion and increased circumduction with gait.  She tended to slide R leg forward initially.  Cues for heel strike and after about 30' pt demonstrating improved heel strike and foot clearance.  Stairs            Wheelchair Mobility     Tilt Bed    Modified Rankin (Stroke Patients Only)       Balance Overall balance assessment: Needs assistance Sitting-balance support: Feet unsupported, No upper extremity supported Sitting balance-Leahy Scale: Normal     Standing balance support: Single extremity supported Standing balance-Leahy Scale: Poor Standing balance comment: Stable wtih UE support  Pertinent Vitals/Pain Pain Assessment Pain Assessment: No/denies pain    Home Living Family/patient expects to be discharged to:: Private residence Living Arrangements: Spouse/significant other Available Help at Discharge: Family;Available 24 hours/day (Spouse reports that he works from home and provide assistance 24/7) Type of Home: Apartment Home Access: Level entry       Home Layout: One level Home Equipment: Rollator (4 wheels);Grab bars - toilet;Grab bars - tub/shower      Prior  Function Prior Level of Function : Independent/Modified Independent             Mobility Comments: Rollator for community mobility ADLs Comments: Independent with self-care; spouse assist wtih IADLs     Extremity/Trunk Assessment   Upper Extremity Assessment Upper Extremity Assessment: Overall WFL for tasks assessed    Lower Extremity Assessment Lower Extremity Assessment: RLE deficits/detail;LLE deficits/detail RLE Deficits / Details: Wound vac on R thigh; ROM is WFL; MMT: ankle 5/5, knee and hip 3/5 but not further tested; does tend to circumduct, decreased hip flex, and decreased DF wtih gait; rest in hip external rotation LLE Deficits / Details: ROM WFL; MMT 5/5    Cervical / Trunk Assessment Cervical / Trunk Assessment: Normal  Communication   Communication Communication: No apparent difficulties    Cognition Arousal: Alert Behavior During Therapy: WFL for tasks assessed/performed   PT - Cognitive impairments: No apparent impairments                         Following commands: Intact       Cueing       General Comments      Exercises     Assessment/Plan    PT Assessment Patient needs continued PT services  PT Problem List Decreased strength;Decreased activity tolerance;Decreased knowledge of use of DME       PT Treatment Interventions DME instruction;Gait training;Therapeutic exercise;Functional mobility training;Therapeutic activities;Patient/family education;Balance training    PT Goals (Current goals can be found in the Care Plan section)  Acute Rehab PT Goals Patient Stated Goal: return home PT Goal Formulation: With patient/family Time For Goal Achievement: 03/04/24 Potential to Achieve Goals: Good Additional Goals Additional Goal #1: Will demonstrate heel strike and decreased circumduction on R with ambulation for decreased fall risk    Frequency Min 1X/week     Co-evaluation               AM-PAC PT 6 Clicks  Mobility  Outcome Measure Help needed turning from your back to your side while in a flat bed without using bedrails?: None Help needed moving from lying on your back to sitting on the side of a flat bed without using bedrails?: None Help needed moving to and from a bed to a chair (including a wheelchair)?: None Help needed standing up from a chair using your arms (e.g., wheelchair or bedside chair)?: None Help needed to walk in hospital room?: A Little Help needed climbing 3-5 steps with a railing? : A Little 6 Click Score: 22    End of Session   Activity Tolerance: Patient tolerated treatment well Patient left: in bed;with call bell/phone within reach Nurse Communication: Mobility status PT Visit Diagnosis: Other abnormalities of gait and mobility (R26.89);Muscle weakness (generalized) (M62.81)    Time: 8957-8898 PT Time Calculation (min) (ACUTE ONLY): 19 min   Charges:   PT Evaluation $PT Eval Low Complexity: 1 Low   PT General Charges $$ ACUTE PT VISIT: 1 Visit  Benjiman, PT Acute Rehab [facility] Rehab [phone]   Benjiman VEAR Mulberry 02/19/2024, 11:59 AM

## 2024-02-19 NOTE — TOC Transition Note (Addendum)
 Transition of Care [name]) - Discharge Note   Patient Details  Name: Evelyn Watts MRN: [id number] Date of Birth: 09-16-1975  [facility]) CM/SW Contact:  Bascom Service, RN Phone Number: 02/19/2024, 10:18 AM   Clinical Narrative:  Awaiting confirmation of NPWT device by [employer] rep Mitch if auth-all info provided on order form.;Patient declines w/c. Wants 3n1 also ordered to be delivered to patients rm prior d/c.[facility] rep Kelly providing device dsg changes. Has own transport home. -recc transfer tub bench instead of 3n1. [employer] aware-awaiting Dr to place order,then narrative can be done. -12;27p-all orders signed awaiting delivery & auth for NPWT, tub bench. -3:44p- [facility] rep Burnard SINK will make home visit tomorrow to place NPWT to cannisters. She can d/c home today with w-d dsg.she is still waiting on tub transfer bench.No further CM needs. -3:55p-per [employer] rep Mitch dme tub bench to be shipped to the home tomorrow. [name] spouse in agreement. No further CM needs.    Final next level of care: Home w Home Health Services Barriers to Discharge: No Barriers Identified   Patient Goals and CMS Choice Patient states their goals for this hospitalization and ongoing recovery are:: Home CMS Medicare.gov Compare Post Acute Care list provided to:: Patient Choice offered to / list presented to : Patient [place] ownership interest in [facility].provided to:: Patient    Discharge Placement                       Discharge Plan and Services Additional resources added to the After Visit Summary for     Discharge Planning Services: CM Consult Post Acute Care Choice: Home Health          DME Arranged: 3-N-1, Negative pressure wound device DME Agency: [employer] Date DME Agency Contacted: 02/19/24 Time DME Agency Contacted: 1018 Representative spoke with at DME Agency: [place] HH Arranged: RN HH Agency: [facility] Date [facility] Agency Contacted: 02/19/24 Time HH Agency Contacted: 1018 Representative spoke with at [facility] Agency: Burnard  Social Drivers of Health (SDOH) Interventions SDOH Screenings   Food Insecurity: No Food Insecurity (02/11/2024)  Housing: Low Risk  (02/11/2024)  Transportation Needs: Unmet Transportation Needs (02/11/2024)  Utilities: Not At Risk (02/11/2024)  Depression (PHQ2-9): Low Risk  (09/19/2023)  Financial Resource Strain: Low Risk  (09/13/2023)  Tobacco Use: Low Risk  (02/14/2024)     Readmission Risk Interventions     No data to display

## 2024-02-19 NOTE — Consult Note (Signed)
 WOC made aware NPWT device is a Medela pump and not compatible with KCI inpatient device/dressing.  Nursing unable to connect current dressing, however bedside nursing will try to remove TRAC pad and place new Medela tubing to the current dressing and hook to the Medela pump.  If not successful bedside nursing will remove current dressing and apply saline moist to moist dressing, top with ABD pads and secure with tape.   [facility] will FU with patient tomorrow, discussed with TOC and bedside nursing.   Evelyn Watts [facility], CNS, CWON-AP [phone]

## 2024-02-19 NOTE — Evaluation (Signed)
 Occupational Therapy Evaluation Patient Details Name: Evelyn Watts MRN: [id number] DOB: [birth year] Today's Date: 02/19/2024   History of Present Illness   Patient is a 48 year old female who presented to the ED on 02/10/24 with c/o pain in medial right thigh.  Dx with abscess and necrotizing fasciitis secondary to sepsis & cellulitis and underwent I & D x2 with placement of wound vac.  PMHx includes DM II, HTN, HLD, hernia repair, cholecystectomy, depression     Clinical Impressions PTA, patient was living at home with spouse and was independent with all self-care activities.  Although patient's activity tolerance has decreased secondary to infection and procedures, she completed ADL tasks during her OT evaluation without difficulty, only taking extra time to complete.  Discussion of needs with patient & spouse indicate that she would benefit from a tub transfer bench to support continued independence and safety upon discharge home from the hospital.  Patient evaluated by Occupational Therapy with no further acute OT needs identified. All education has been completed and the patient has no further questions. OT to sign off. Thank you for referral.      If plan is discharge home, recommend the following:   A little help with walking and/or transfers     Functional Status Assessment   Patient has not had a recent decline in their functional status     Equipment Recommendations   Tub/shower bench      Precautions/Restrictions   Precautions Precautions: Other (comment) (Wound vac) Restrictions Weight Bearing Restrictions Per Provider Order: No     Mobility Bed Mobility Overal bed mobility: Independent    Transfers Overall transfer level: Modified independent   General transfer comment: Patient used assist bars / grab bars      Balance Overall balance assessment: Needs assistance Sitting-balance support: Feet unsupported, No upper extremity supported Sitting  balance-Leahy Scale: Normal   Standing balance support: Single extremity supported, During functional activity Standing balance-Leahy Scale: Fair Standing balance comment: Used IV pole for support while walking to bathroom     ADL either performed or assessed with clinical judgement   ADL Overall ADL's : At baseline;Modified independent Eating/Feeding: Independent   Grooming: Wash/dry face;Wash/dry hands;Oral care;Independent   Upper Body Bathing: Modified independent   Lower Body Bathing: Modified independent   Upper Body Dressing : Modified independent   Lower Body Dressing: Modified independent   Toilet Transfer: Modified Independent;Grab bars;Ambulation (Used IV pole during ambulation)   Toileting- Clothing Manipulation and Hygiene: Modified independent   Functional mobility during ADLs: Modified independent       Vision Baseline Vision/History: 0 No visual deficits Ability to See in Adequate Light: 0 Adequate Patient Visual Report: No change from baseline Vision Assessment?: No apparent visual deficits            Pertinent Vitals/Pain Pain Assessment Pain Assessment: No/denies pain     Extremity/Trunk Assessment Upper Extremity Assessment Upper Extremity Assessment: Overall WFL for tasks assessed   Lower Extremity Assessment Lower Extremity Assessment: Defer to PT evaluation   Cervical / Trunk Assessment Cervical / Trunk Assessment: Normal   Communication Communication Communication: No apparent difficulties   Cognition Arousal: Alert Behavior During Therapy: WFL for tasks assessed/performed Cognition: No apparent impairments   Following commands: Intact       General Comments   Provided education regarding LB dressing considerations for portable wound vac; patient verbalized understanding.           Home Living Family/patient expects to be discharged to:: Private residence  Living Arrangements: Spouse/significant other Available Help at  Discharge: Family;Available 24 hours/day (Spouse reports that he works from home and provide assistance 24/7) Type of Home: Apartment Home Access: Level entry   Home Layout: One level   Bathroom Shower/Tub: Engineer[employer] Systems: Standard   Home Equipment: Rollator (4 wheels);Grab bars - toilet;Grab bars - tub/shower      Prior Functioning/Environment Prior Level of Function : Independent/Modified Independent Mobility Comments: Rollator for community mobility ADLs Comments: Independent with self-care    OT Problem List: Decreased activity tolerance        OT Goals(Current goals can be found in the care plan section)   Acute Rehab OT Goals Patient Stated Goal: To return home and heal from wound OT Goal Formulation: With patient/family Potential to Achieve Goals: Good   OT Frequency:  1 visit; evaluation only       AM-PAC OT 6 Clicks Daily Activity     Outcome Measure Help from another person eating meals?: None Help from another person taking care of personal grooming?: None Help from another person toileting, which includes using toliet, bedpan, or urinal?: None Help from another person bathing (including washing, rinsing, drying)?: None Help from another person to put on and taking off regular upper body clothing?: None Help from another person to put on and taking off regular lower body clothing?: None 6 Click Score: 24   End of Session Equipment Utilized During Treatment: Gait belt Nurse Communication: Other (comment) (Messages to case mgr regarding tub bench)  Activity Tolerance: Patient tolerated treatment well (Demonstrated mild s/s of fatigue s/p completion of ADLs) Patient left: in bed;with call bell/phone within reach  OT Visit Diagnosis: Unsteadiness on feet (R26.81)                Time: 1005-1036 OT Time Calculation (min): 31 min Charges:  OT General Charges $OT Visit: 1 Visit OT Evaluation $OT Eval Moderate Complexity: 1 Mod OT  Treatments $Self Care/Home Management : 8-22 mins  Maliya Marich B. Seiji Wiswell, MS, OTR/L 02/19/2024, 11:11 AM

## 2024-02-19 NOTE — Assessment & Plan Note (Addendum)
 Last A1c 11.6, very poor control Her husband reports markedly improved control while here and wants to continue this regimen at the time of dc; will plan to add Novolog  Flexpens Currently on Semglee  20 units BID with moderate-scale SSI Resume metformin , semaglutide  DM coordinator consulting Continue gabapentin 

## 2024-02-19 NOTE — Assessment & Plan Note (Signed)
 Body mass index is 38.83 kg/m.Evelyn Watts  Weight loss should be encouraged on an ongoing basis Resume semaglutide  at the time of dc Outpatient PCP/bariatric medicine f/u encouraged Significantly low or high BMI is associated with higher medical risk including morbidity and mortality

## 2024-02-20 ENCOUNTER — Telehealth: Payer: Self-pay

## 2024-02-20 ENCOUNTER — Other Ambulatory Visit: Payer: Self-pay | Admitting: Nurse Practitioner

## 2024-02-20 DIAGNOSIS — J45909 Unspecified asthma, uncomplicated: Secondary | ICD-10-CM | POA: Diagnosis not present

## 2024-02-20 DIAGNOSIS — L03115 Cellulitis of right lower limb: Secondary | ICD-10-CM | POA: Diagnosis not present

## 2024-02-20 DIAGNOSIS — M726 Necrotizing fasciitis: Secondary | ICD-10-CM | POA: Diagnosis not present

## 2024-02-20 DIAGNOSIS — I10 Essential (primary) hypertension: Secondary | ICD-10-CM | POA: Diagnosis not present

## 2024-02-20 DIAGNOSIS — E66812 Obesity, class 2: Secondary | ICD-10-CM | POA: Diagnosis not present

## 2024-02-20 DIAGNOSIS — A419 Sepsis, unspecified organism: Secondary | ICD-10-CM | POA: Diagnosis not present

## 2024-02-20 DIAGNOSIS — Z794 Long term (current) use of insulin: Secondary | ICD-10-CM | POA: Diagnosis not present

## 2024-02-20 DIAGNOSIS — E119 Type 2 diabetes mellitus without complications: Secondary | ICD-10-CM | POA: Diagnosis not present

## 2024-02-20 DIAGNOSIS — D649 Anemia, unspecified: Secondary | ICD-10-CM | POA: Diagnosis not present

## 2024-02-20 DIAGNOSIS — E1165 Type 2 diabetes mellitus with hyperglycemia: Secondary | ICD-10-CM

## 2024-02-20 NOTE — Telephone Encounter (Signed)
 Copied from CRM [id number]. Topic: Clinical - Order For Equipment >> Feb 20, 2024  1:57 PM Darshell M wrote: Reason for CRM: Patient hospitalized for infection in leg, now released. Patient has wound vac. Patient requesting orders for wheelchair., preferably electric. Patient CB# [phone]. Patient not sure which DME company to use. Pt has upcoming appt. We can place order then to go along with documentation. KH

## 2024-02-20 NOTE — Transitions of Care (Post Inpatient/ED Visit) (Signed)
   02/20/2024  Name: Evelyn Watts MRN: [id number] DOB: 12-06-1975  Today's TOC FU Call Status: Today's TOC FU Call Status:: Unsuccessful Call (1st Attempt) Unsuccessful Call (1st Attempt) Date: 02/20/24  Attempted to reach the patient regarding the most recent Inpatient/ED visit.  Follow Up Plan: Additional outreach attempts will be made to reach the patient to complete the Transitions of Care (Post Inpatient/ED visit) call.   Arvin Seip RN, BSN, CCM [employer], Population Health Case Manager Phone: [phone]

## 2024-02-21 ENCOUNTER — Other Ambulatory Visit: Payer: Self-pay | Admitting: Nurse Practitioner

## 2024-02-21 ENCOUNTER — Telehealth: Payer: Self-pay

## 2024-02-21 DIAGNOSIS — K047 Periapical abscess without sinus: Secondary | ICD-10-CM

## 2024-02-21 NOTE — Transitions of Care (Post Inpatient/ED Visit) (Signed)
   02/21/2024  Name: Evelyn Watts MRN: [id number] DOB: 03/09/1976  Today's TOC FU Call Status: Today's TOC FU Call Status:: Unsuccessful Call (2nd Attempt) Unsuccessful Call (2nd Attempt) Date: 02/21/24  Attempted to reach the patient regarding the most recent Inpatient/ED visit.  Follow Up Plan: Additional outreach attempts will be made to reach the patient to complete the Transitions of Care (Post Inpatient/ED visit) call.   Arvin Seip RN, BSN, CCM [employer], Population Health Case Manager Phone: [phone]

## 2024-02-22 ENCOUNTER — Telehealth: Payer: Self-pay | Admitting: Nurse Practitioner

## 2024-02-22 ENCOUNTER — Telehealth: Payer: Self-pay

## 2024-02-22 NOTE — Telephone Encounter (Signed)
 Copied from CRM #[id number]. Topic: Clinical - Order For Equipment >> Feb 21, 2024 11:44 AM Jasmin G wrote: Reason for CRM: Pt requested a call back from Ms. Nichols team to discuss recent request for wheelchair, please refer to recent Encounters for more info. Call pt back at [phone].

## 2024-02-22 NOTE — Telephone Encounter (Signed)
 Sent to [employer] for further eval. [facility]

## 2024-02-22 NOTE — Telephone Encounter (Signed)
 Please advise [facility]

## 2024-02-22 NOTE — Transitions of Care (Post Inpatient/ED Visit) (Signed)
   02/22/2024  Name: Evelyn Watts MRN: [id number] DOB: 1975-11-30  Today's TOC FU Call Status: Today's TOC FU Call Status:: Unsuccessful Call (3rd Attempt) Unsuccessful Call (3rd Attempt) Date: 02/22/24  Attempted to reach the patient regarding the most recent Inpatient/ED visit.  Follow Up Plan: No further outreach attempts will be made at this time. We have been unable to contact the patient.  Felecia Stanfill J. Nessie Nong RN, MSN [facility], [facility] RN Care Manager Direct Dial: [phone]  Fax: [phone] Website: [email or web address]

## 2024-02-23 ENCOUNTER — Telehealth: Payer: Self-pay

## 2024-02-23 ENCOUNTER — Other Ambulatory Visit: Payer: Self-pay

## 2024-02-23 ENCOUNTER — Other Ambulatory Visit (HOSPITAL_COMMUNITY): Payer: Self-pay

## 2024-02-23 NOTE — Telephone Encounter (Signed)
 Attempted to outreach patient to follow-up on diabetes management s/p hospital discharge on 02/19/24 for necrotizing fasciitis. During hospitalization, noted that A1C increased to 12.5%. Novolog  was added to regimen at discharge. Patient has PCP appt on 02/29/24. Will follow-up after this.   Left voice mail with call back number.  Lorain Baseman, PharmD [facility] [phone]

## 2024-02-23 NOTE — Progress Notes (Unsigned)
 02/23/2024 Name: Evelyn Watts MRN: [id number] DOB: 1975/10/10  No chief complaint on file.   Evelyn Watts is a 48 y.o. year old female who presented for a telephone visit.   They were referred to the pharmacist by their PCP for assistance in managing diabetes, hypertension, and hyperlipidemia.   Subjective: At pharmacy telephone appt on 10/09/23, patient reported that she started Ozempic  and stopped Victoza . However, she had run out of Ozempic  ~2 weeks ago. She was instructed to increase Ozempic  to 0.5 mg weekly. She was dispensed a 3 mo supply of FL3+ sensors on 10/09/23 from [facility] pharmacy. She was seen by her PCP, Bascom Borer, NP on 11/09/23. A1C had decreased slightly from 12.8% to 11.6%. Her BP was controlled at 121/74 mmHg. At pharmacy follow-up call on 11/20/23, patient reported tolerating Ozempic  well. She was instructed to increase to 1 mg weekly. She was lost to follow-up then outreached by CPhT, Jill Simcox on 01/01/24. We requested FL3+ sensors to be mailed out from [employer]. Patient was admited at [facility] from 02/10/24 to 02/19/24 for necrotizing fasciitis. She was discharged with a wound vac. A1C was up to 12.5%. Novolog  was added to regimen.  Today, patient reports doing well. She has been out of her Ozempic  for ~2 weeks. Reports she does not currently have a ride for the pharmacy, so would prefer to receive a 3 mo supply in the mail. She denies missed doses of her other medications. She reports that she was tolerating Ozempic  0.5 mg well without any GI issues (nausea, vomiting, constipation, diarrhea, abdominal pain).   Care Team: Primary Care Provider: Borer Bascom RAMAN, NP ; Next Scheduled Visit: 02/29/24  Medication Access/Adherence  Current Pharmacy:  [id number] [place], [place] - [address] AT [address] [place] [address] Phone: [phone] Fax: [phone]  [facility] - [address] [place] - [address] [place] [address] Phone: [phone] Fax: [phone]  DARRYLE LONG - [facility] [address] [place] [address] Phone: [phone] Fax: [phone]   Patient reports affordability concerns with their medications: No  - copays usually $0  Patient reports access/transportation concerns to their pharmacy: Yes  - Still using multiple pharmacies: [facility] (most maintenance meds), [facility] on [address] (Ozempic ), [facility] mail order (FL3+) - Patient reports that she continues to have difficulty obtaining transportation for her appointments - working with LCSW - She never received FL3+ CGM from [facility] pharmacy (mailed out 10/09/23) - thinks someone likely stole the package from her porch  Patient reports adherence concerns with their medications:  - Yes - she has an in-home aid that comes to help her with medications and other ADLs every day except Sundays.  - She reports that she ran out of Ozempic  about 2 weeks ago. She confirms she was taking 0.5 mg prior to running out.   Diabetes:  Current medications: Lantus  30 units twice daily, Ozempic  0.5 mg weekly (Mondays), metformin  XR 500 mg - 2 tablets in the morning and 1 tablet at night, Novolog  0-6 Units into the skin 3 (three) times daily with meals per scale: BG <150= 0 unit; BG 150-200= 1 unit; BG 201-250= 2 unit; BG 251-300= 3 unit; BG 301-350= 4 unit; BG 351-400= 5 unit; BG >400= 6 unit   Medications tried in the past: dulaglutide (upset stomach, nausea), Victoza  (inadequate response)  She denies GI AE since switching from Victoza  to Ozempic . Denies nausea,  vomiting, diarrhea, abdominal pain.   Using glucometer; testing in the morning, after meals, and at bedtime  This AM (before eating) - 120 mg/dL. Does not recall any additional numbers. Unable to access her glucometer for more detailed review.   Patient did not receive FL3+ via mail order. Would have to pursue insurance override  for refill before Sept 2025.   Patient denies hypoglycemic s/sx including dizziness, shakiness, sweating. Patient denies hyperglycemic symptoms including polyuria, polydipsia, polyphagia, nocturia, neuropathy, blurred vision.  Current meal patterns: 3 meals/day - reports she is mainly baking and boiling her foods. Eating more vegetables - cabbage, stir fry.  - Breakfast: sausage, eggs, grits, oatmeal - Lunch: hamburger with no bread, just lettuce, fruit - Supper: chicken, small portion of rice, greens - Snacks: handful of chips occasionally - Drinks: glass of water  with every meal  Current physical activity: walking in the mornings almost every day for 25-30 minutes with aid. Limited by pain in feet with bone spurs. She asks about whether DME orders were placed for rollator with chair.  Hypertension:  Current medications: lisinopril  20 mg daily (current supply per fill hx)  Hyperlipidemia/ASCVD Risk Reduction  Current lipid lowering medications: rosuvastatin  20 mg daily (current supply per fill hx)  The 10-year ASCVD risk score (Arnett DK, et al., 2019) is: 5.4%   Values used to calculate the score:     Age: 37 years     Clincally relevant sex: Female     Is Non-Hispanic African American: No     Diabetic: Yes     Tobacco smoker: No     Systolic Blood Pressure: 148 mmHg     Is BP treated: Yes     HDL Cholesterol: 30 mg/dL     Total Cholesterol: 163 mg/dL   Objective:  BP Readings from Last 3 Encounters:  02/19/24 (!) 148/65  11/09/23 121/74  09/12/23 132/82    Lab Results  Component Value Date   HGBA1C 12.5 (H) 02/11/2024   HGBA1C 11.6 (A) 11/09/2023   HGBA1C 12.8 (A) 06/08/2023    Lab Results  Component Value Date   CREATININE 0.45 02/18/2024   BUN 5 (L) 02/18/2024   NA 136 02/18/2024   K 3.9 02/18/2024   CL 100 02/18/2024   CO2 28 02/18/2024    Lab Results  Component Value Date   CHOL 163 09/12/2023   HDL 30 (L) 09/12/2023   LDLCALC 73 09/12/2023    LDLDIRECT 79 09/12/2023   TRIG 380 (H) 09/12/2023   CHOLHDL 5.4 (H) 09/12/2023    Medications Reviewed Today   Medications were not reviewed in this encounter       Assessment/Plan:   Diabetes: - Currently uncontrolled with last A1c 11.6% above goal < 7%, but improved from 12.8% on 06/08/23. Patient would benefit from CGM monitoring, but unfortunately her shipment for the FL3+ sensors was lost. She is tolerating Ozempic  0.5 mg weekly well, so will plan to increase to 1 mg weekly at next fill. Will provide with 3 mo supply via mail order to promote adherence.  - Reviewed long term cardiovascular and renal outcomes of uncontrolled blood sugar - Reviewed goal A1c, goal fasting, and goal 2 hour post prandial glucose - Reviewed dietary modifications including drinking more water , focusing on intake of protein and nonstarchy vegetables - Reviewed lifestyle modifications including: increasing physical activity. Commended for daily walking.  - Recommend to increase Ozempic  to 1 mg once weekly. Will collaborate with PCP to place orders. - Recommend to continue  Lantus  30 units BID. Can consider switching to concentrated insulin  in the future to reduce injection burden.  - Recommend to continue metformin  XR 1000 mg in the AM and 500 mg in the PM - Recommend to check glucose twice daily with glucometer. Will re-attempt to access CGM at follow-up.  - A1C due Oct 2025   Hypertension: - Currently controlled with last office BP 121/74 mmHg controlled belwo goal <130/80. Patient has adequate supply of ACEi.  - Continue lisinopril  20 mg daily   Hyperlipidemia/ASCVD Risk Reduction: - Currently uncontrolled with last LDL 73 mg/dL, above goal <29 mg/dL but improved from 889 mg/dL. TG elevated to 380 mg/dL, above goal < 849 mg/dL, but improved from 543 mg/dL. Adherence appears appropriate. Patient may benefit from addition of ezetimibe 10 mg daily, but hesitant to increase pill burden given existing issues  with adherence and confusion at the pharmacy.  - Continue rosuvastatin  20 mg daily   Follow Up Plan:  Pharmacist telephone 12/27/23, PCP 02/09/24  Lorain Baseman, PharmD PGY1 Pharmacy Resident

## 2024-02-24 DIAGNOSIS — L03115 Cellulitis of right lower limb: Secondary | ICD-10-CM | POA: Diagnosis not present

## 2024-02-24 DIAGNOSIS — J45909 Unspecified asthma, uncomplicated: Secondary | ICD-10-CM | POA: Diagnosis not present

## 2024-02-24 DIAGNOSIS — D649 Anemia, unspecified: Secondary | ICD-10-CM | POA: Diagnosis not present

## 2024-02-24 DIAGNOSIS — M726 Necrotizing fasciitis: Secondary | ICD-10-CM | POA: Diagnosis not present

## 2024-02-24 DIAGNOSIS — I10 Essential (primary) hypertension: Secondary | ICD-10-CM | POA: Diagnosis not present

## 2024-02-24 DIAGNOSIS — E66812 Obesity, class 2: Secondary | ICD-10-CM | POA: Diagnosis not present

## 2024-02-24 DIAGNOSIS — Z794 Long term (current) use of insulin: Secondary | ICD-10-CM | POA: Diagnosis not present

## 2024-02-24 DIAGNOSIS — A419 Sepsis, unspecified organism: Secondary | ICD-10-CM | POA: Diagnosis not present

## 2024-02-24 DIAGNOSIS — E119 Type 2 diabetes mellitus without complications: Secondary | ICD-10-CM | POA: Diagnosis not present

## 2024-02-26 DIAGNOSIS — M726 Necrotizing fasciitis: Secondary | ICD-10-CM | POA: Diagnosis not present

## 2024-02-26 DIAGNOSIS — E66812 Obesity, class 2: Secondary | ICD-10-CM | POA: Diagnosis not present

## 2024-02-26 DIAGNOSIS — Z794 Long term (current) use of insulin: Secondary | ICD-10-CM | POA: Diagnosis not present

## 2024-02-26 DIAGNOSIS — J45909 Unspecified asthma, uncomplicated: Secondary | ICD-10-CM | POA: Diagnosis not present

## 2024-02-26 DIAGNOSIS — D649 Anemia, unspecified: Secondary | ICD-10-CM | POA: Diagnosis not present

## 2024-02-26 DIAGNOSIS — L03115 Cellulitis of right lower limb: Secondary | ICD-10-CM | POA: Diagnosis not present

## 2024-02-26 DIAGNOSIS — A419 Sepsis, unspecified organism: Secondary | ICD-10-CM | POA: Diagnosis not present

## 2024-02-26 DIAGNOSIS — E119 Type 2 diabetes mellitus without complications: Secondary | ICD-10-CM | POA: Diagnosis not present

## 2024-02-26 DIAGNOSIS — I10 Essential (primary) hypertension: Secondary | ICD-10-CM | POA: Diagnosis not present

## 2024-02-26 NOTE — Patient Instructions (Signed)
 Avelina Laster - I am sorry I was unable to reach you today. I work with Oley Bascom RAMAN, NP and am calling to support your healthcare needs. Please contact me at [phone] at your earliest convenience. I look forward to speaking with you soon.   Thank you,  Rosaline Finlay, RN MSN [place]  [facility] RN Care Manager Direct Dial: [phone]  Fax: [phone]

## 2024-02-26 NOTE — Patient Outreach (Signed)
 Care Coordination   02/26/2024 Name: Roselin Wiemann MRN: [id number] DOB: [birth year]   Care Coordination Outreach Attempts:  An unsuccessful outreach was attempted today to complete CCM follow-up visit following hospitalization. Note 3 unsuccessful outreaches from [facility] RN.   Follow Up Plan:  Additional outreach attempts will be made to complete follow-up visit.   Encounter Outcome:  No Answer. HIPAA compliant voicemail left requesting return call.   Rosaline Finlay, RN MSN [place]  [facility] Population Health RN Care Manager Direct Dial: [phone]  Fax: [phone]

## 2024-02-27 ENCOUNTER — Telehealth: Payer: Self-pay | Admitting: Nurse Practitioner

## 2024-02-27 NOTE — Telephone Encounter (Signed)
 Please reach out to patient for transportation to appt on 11/6.

## 2024-02-28 ENCOUNTER — Other Ambulatory Visit: Payer: Self-pay

## 2024-02-28 NOTE — Patient Outreach (Signed)
 Complex Care Management   Visit Note  02/28/2024  Name:  Evelyn Watts MRN: [id number] DOB: 07-15-1975  Situation: Referral received for Complex Care Management related to recent hospitalization for cellulitis I obtained verbal consent from Patient.  Visit completed with Patient  on the phone  Background:   Past Medical History:  Diagnosis Date   Allergies    Asthma    Diabetes mellitus without complication (HCC)    Essential hypertension     Assessment: Patient Reported Symptoms:  Cognitive Cognitive Status: Able to follow simple commands, Alert and oriented to person, place, and time, Normal speech and language skills Cognitive/Intellectual Conditions Management [RPT]: None reported or documented in medical history or problem list   Health Maintenance Behaviors: Annual physical exam Health Facilitated by: Pain control, Rest  Neurological Neurological Review of Symptoms: Not assessed    HEENT HEENT Symptoms Reported: Not assessed      Cardiovascular Cardiovascular Symptoms Reported: Swelling in legs or feet Does patient have uncontrolled Hypertension?: No Is patient checking Blood Pressure at home?: No Cardiovascular Management Strategies: Medication therapy Cardiovascular Comment: Patient reports BLE swelling (equal on both sides) which she attributes to not being able to get up and move around as she normally does. She reports she has been elevating her legs. Advised performing leg pumps/ankle circles while in the chair  Respiratory Respiratory Symptoms Reported: No symptoms reported Additional Respiratory Details: Patient reports last Albuterol  use a couple of days ago Respiratory Management Strategies: Medication therapy, Routine screening  Endocrine Endocrine Symptoms Reported: No symptoms reported Is patient diabetic?: Yes Is patient checking blood sugars at home?: No Endocrine Comment: Patient reports she cannot find her glucometer. Advised of telephone follow-up  scheduled with pharmacist 03/01/24 at 2 PM  Gastrointestinal Gastrointestinal Symptoms Reported: No symptoms reported Additional Gastrointestinal Details: Patient reports normal BMs since returning home from hospital. She reports my appetite is back a little bit. She reports eating more baked chicken, salmon, and limiting fried foods      Genitourinary Genitourinary Symptoms Reported: No symptoms reported    Integumentary Integumentary Symptoms Reported: Wound Additional Integumentary Details: Wound vac in place R thigh following recent hospitalization for cellulitis. Patient reports she had a boil on R thigh leading up to hospitalization, which was determined to be the source of infection. Patient was not discharged with antibiotics. She denies fever. Patient reports significant pain with dressing changes. She reports she is out of Oxycodone  prescribed at discharge. Skin Management Strategies: Medical device, Dressing changes Skin Comment: Patient reports HH RN from [facility] is coming Monday and Thursday.  Musculoskeletal Musculoskelatal Symptoms Reviewed: Difficulty walking, Joint pain Additional Musculoskeletal Details: Patient reports she has not been able to ambulate due to presence of wound vac on R thigh. She reports she has not heard anything regarding order for wheelchair or bedside commode. Per chart review, patient will be evaluated by PCP for wheelchair at appointment 02/29/24. Per d/c summary, a tub bench was recommended instead of BSC (3 in 1). Patient confirms she has received tub bench. Note placed for PCP that patient would like a BSC. Musculoskeletal Management Strategies: Medical device, Adequate rest, Exercise (Rollator) Falls in the past year?: No Number of falls in past year: 1 or less Was there an injury with Fall?: No Fall Risk Category Calculator: 0 Patient Fall Risk Level: Low Fall Risk Fall risk Follow up: Falls evaluation completed, Education provided, Falls  prevention discussed  Psychosocial Psychosocial Symptoms Reported: No symptoms reported  02/28/2024    PHQ2-9 Depression Screening   Little interest or pleasure in doing things Not at all  Feeling down, depressed, or hopeless Not at all  PHQ-2 - Total Score 0  Trouble falling or staying asleep, or sleeping too much    Feeling tired or having little energy    Poor appetite or overeating     Feeling bad about yourself - or that you are a failure or have let yourself or your family down    Trouble concentrating on things, such as reading the newspaper or watching television    Moving or speaking so slowly that other people could have noticed.  Or the opposite - being so fidgety or restless that you have been moving around a lot more than usual    Thoughts that you would be better off dead, or hurting yourself in some way    PHQ2-9 Total Score    If you checked off any problems, how difficult have these problems made it for you to do your work, take care of things at home, or get along with other people    Depression Interventions/Treatment      There were no vitals filed for this visit.  Medications Reviewed Today     Reviewed by Arno Rosaline SQUIBB, RN (Registered Nurse) on 02/28/24 at 1147  Med List Status: <None>   Medication Order Taking? Sig Documenting Provider Last Dose Status Informant  acetaminophen  (TYLENOL ) 500 MG tablet [id number]  Take 2 tablets (1,000 mg total) by mouth every 6 (six) hours as needed for mild pain (pain score 1-3), fever or headache. Vicci Burnard SAUNDERS, PA-C  Active   albuterol  (VENTOLIN  HFA) 108 (90 Base) MCG/ACT inhaler [id number]  Inhale 2 puffs into the lungs every 4 (four) hours as needed for wheezing. Oley Bascom RAMAN, NP  Active Self, Pharmacy Records  Blood Glucose Monitoring Suppl (BLOOD GLUCOSE MONITOR SYSTEM) w/Device KIT [id number]  Use to check blood sugar up to four times daily Barbarann Nest, MD  Active   cetirizine  (ZYRTEC ) 10 MG tablet  [id number]  Take 1 tablet (10 mg total) by mouth daily. Oley Bascom RAMAN, NP  Active Self, Pharmacy Records  Ferrous Sulfate  90 (18 Fe) MG TABS [id number]  Take 1 tablet by mouth daily. Oley Bascom RAMAN, NP  Active Self, Pharmacy Records  gabapentin  (NEURONTIN ) 300 MG capsule [id number]  TAKE 1 CAPSULE THREE TIMES DAILY Nichols, Tonya S, NP  Active Self, Pharmacy Records  Glucose Blood (BLOOD GLUCOSE TEST STRIPS) STRP [id number]  Use up to four times daily as directed. (FOR ICD-10 E10.9, E11.9). Barbarann Nest, MD  Active   IBU 600 MG tablet [id number]  TAKE 1 TABLET EVERY 8 HOURS AS NEEDED Nichols, Tonya S, NP  Active   insulin  aspart (NOVOLOG ) 100 UNIT/ML FlexPen [id number] Yes Inject 0-6 Units into the skin 3 (three) times daily with meals. Check Blood Glucose (BG) and inject per scale: BG <150= 0 unit; BG 150-200= 1 unit; BG 201-250= 2 unit; BG 251-300= 3 unit; BG 301-350= 4 unit; BG 351-400= 5 unit; BG >400= 6 unit and Call Primary Care. Barbarann Nest, MD  Active   Insulin  Glargine Solostar (LANTUS ) 100 UNIT/ML Solostar Pen [id number] Yes Inject 20 Units into the skin 2 (two) times daily. Barbarann Nest, MD  Active   Insulin  Pen Needle (PEN NEEDLES) 32G X 4 MM MISC [id number]  Use as directed to inject insulin  twice daily. Barbarann Nest, MD  Active   Lancet Device MISC [id number]  1 each  by Does not apply route as directed. Dispense based on patient and insurance preference. Use up to four times daily as directed. (FOR ICD-10 E10.9, E11.9). Barbarann Nest, MD  Active   Lancets MISC [id number]  Use up to four times daily as directed. (FOR ICD-10 E10.9, E11.9). Barbarann Nest, MD  Active   lisinopril  (ZESTRIL ) 20 MG tablet [id number]  Take 1 tablet (20 mg total) by mouth daily. Oley Bascom RAMAN, NP  Active Self, Pharmacy Records  metFORMIN  (GLUCOPHAGE -XR) 500 MG 24 hr tablet [id number]  Take 2 tablets (1,000 mg total) by mouth 2 (two) times daily with a meal. Oley Bascom RAMAN, NP  Active Self, Pharmacy  Records  methocarbamol  (ROBAXIN ) 500 MG tablet [id number] Yes Take 1 tablet (500 mg total) by mouth every 6 (six) hours as needed for muscle spasms. Vicci Burnard SAUNDERS, PA-C  Active   naproxen  (NAPROSYN ) 500 MG tablet [id number]  TAKE 1 TABLET TWICE DAILY WITH MEALS Nichols, Tonya S, NP  Active Self, Pharmacy Records  oxyCODONE  (OXY IR/ROXICODONE ) 5 MG immediate release tablet [id number]  Take 1-3 tablets (5-15 mg total) by mouth every 4 (four) hours as needed for moderate pain (pain score 4-6) or severe pain (pain score 7-10) (5mg  for moderate pain, 10mg  for severe pain; 15 mg for dressing changes).  Patient not taking: Reported on 02/28/2024   Vicci Burnard SAUNDERS, PA-C  Active   rosuvastatin  (CRESTOR ) 40 MG tablet [id number]  Take 1 tablet (40 mg total) by mouth daily. Oley Bascom RAMAN, NP  Active Self, Pharmacy Records  Semaglutide , 1 MG/DOSE, 4 MG/3ML SOPN [id number]  Inject 1 mg into the skin once a week. Oley Bascom RAMAN, NP  Active Self, Pharmacy Records            Recommendation:   Acute PCP follow-up hospital follow-up 02/29/24. Specialty provider follow-up pharmacist 03/01/24 DME requests:  bedside commode, wheelchair requested by patient. Will be discussed at upcoming PCP visit Continue Current Plan of Care Message sent to PCP regarding patient out of pain medication  Follow Up Plan:   Telephone follow-up in 3 business days  Rosaline Finlay, RN MSN [place]  [facility] Population Health RN Care Manager Direct Dial: [phone]  Fax: [phone]

## 2024-02-28 NOTE — Patient Instructions (Signed)
 Visit Information  Thank you for taking time to visit with me today. Please don't hesitate to contact me if I can be of assistance to you before our next scheduled appointment.  I have sent a message to [facility] to let her know you are out of pain medication. I advise discussing order for a wheelchair and bedside commode with her at your visit tomorrow, as there needs to be documentation from your provider for this equipment to be ordered.  Telephone follow-up in 3 business days following hospital follow-up and pharmacist follow-up.  Please call the care guide team at [phone] if you need to cancel, schedule, or reschedule an appointment.   Please call the Suicide and Crisis Lifeline: 988 call 1-800-273-TALK (toll free, 24 hour hotline) if you are experiencing a Mental Health or Behavioral Health Crisis or need someone to talk to.  Rosaline Finlay, RN MSN [place]  [facility] Population Health RN Care Manager Direct Dial: [phone]  Fax: [phone]  Cellulitis, Adult  Cellulitis is a skin infection. The infected area is often warm, red, swollen, and sore. It occurs most often on the legs, feet, and toes, but can happen on any part of the body. This condition can be life-threatening without treatment. It is very important to get treated right away. What are the causes? This condition is caused by bacteria. The bacteria enter through a break in the skin, such as: A cut. A burn. A bug bite. An animal bite. An open sore. A crack. What increases the risk? Having a weak body's defense system (immune system). Being older than 46 years old. Having a blood sugar problem (diabetes). Having a long-term liver disease (cirrhosis) or kidney disease. Being very overweight (obese). Having a skin problem, such as: An itchy rash. A rash caused by a fungus. A rash with blisters. Slow movement of blood in the veins (venous stasis). Fluid buildup below the skin (edema). This condition is  more likely to occur in people who: Have open cuts, burns, bites, or scrapes on the skin. Have been treated with high-energy rays (radiation). Use IV drugs. What are the signs or symptoms? Skin that: Looks red or purple, or slightly darker than your usual skin color. Has streaks. Has spots. Is swollen. Is sore or painful when you touch it. Is warm. A fever. Chills. Blisters. Tiredness (fatigue). How is this treated? Medicines to treat infections or allergies. Rest. Placing cold or warm cloths on the skin. Staying in the hospital, if the condition is very bad. You may need medicines through an IV. Follow these instructions at home: Medicines Take over-the-counter and prescription medicines only as told by your doctor. If you were prescribed antibiotics, take them as told by your doctor. Do not stop using them even if you start to feel better. General instructions Drink enough fluid to keep your pee (urine) pale yellow. Do not touch or rub the infected area. Raise (elevate) the infected area above the level of your heart while you are sitting or lying down. Return to your normal activities when your doctor says that it is safe. Place cold or warm cloths on the area as told by your doctor. Keep all follow-up visits. Your doctor will need to make sure that a more serious infection is not developing. Contact a doctor if: You have a fever. You do not start to get better after 1-2 days of treatment. Your bone or joint under the infected area starts to hurt after the skin has healed. Your infection comes  back in the same area or another area. Signs of this may include: You have a swollen bump in the area. Your red area gets larger, turns dark in color, or hurts more. You have more fluid coming from the wound. Pus or a bad smell develops in your infected area. You have more pain. You feel sick and have muscle aches and weakness. You develop vomiting or watery poop that will not go  away. Get help right away if: You see red streaks coming from the area. You notice the skin turns purple or black and falls off. These symptoms may be an emergency. Get help right away. Call 911. Do not wait to see if the symptoms will go away. Do not drive yourself to the hospital. This information is not intended to replace advice given to you by your health care provider. Make sure you discuss any questions you have with your health care provider. Document Revised: 12/07/2021 Document Reviewed: 12/07/2021 Elsevier Patient Education  2024 [employer].

## 2024-02-29 ENCOUNTER — Encounter: Payer: Self-pay | Admitting: Nurse Practitioner

## 2024-02-29 ENCOUNTER — Ambulatory Visit (INDEPENDENT_AMBULATORY_CARE_PROVIDER_SITE_OTHER): Payer: Self-pay | Admitting: Nurse Practitioner

## 2024-02-29 VITALS — BP 155/83 | HR 82 | Wt 268.0 lb

## 2024-02-29 DIAGNOSIS — M726 Necrotizing fasciitis: Secondary | ICD-10-CM | POA: Diagnosis not present

## 2024-02-29 DIAGNOSIS — Z794 Long term (current) use of insulin: Secondary | ICD-10-CM | POA: Diagnosis not present

## 2024-02-29 DIAGNOSIS — E1165 Type 2 diabetes mellitus with hyperglycemia: Secondary | ICD-10-CM

## 2024-02-29 DIAGNOSIS — R52 Pain, unspecified: Secondary | ICD-10-CM | POA: Diagnosis not present

## 2024-02-29 DIAGNOSIS — E119 Type 2 diabetes mellitus without complications: Secondary | ICD-10-CM | POA: Diagnosis not present

## 2024-02-29 DIAGNOSIS — I10 Essential (primary) hypertension: Secondary | ICD-10-CM | POA: Diagnosis not present

## 2024-02-29 DIAGNOSIS — A419 Sepsis, unspecified organism: Secondary | ICD-10-CM | POA: Diagnosis not present

## 2024-02-29 DIAGNOSIS — L03115 Cellulitis of right lower limb: Secondary | ICD-10-CM | POA: Diagnosis not present

## 2024-02-29 DIAGNOSIS — D649 Anemia, unspecified: Secondary | ICD-10-CM | POA: Diagnosis not present

## 2024-02-29 DIAGNOSIS — T148XXA Other injury of unspecified body region, initial encounter: Secondary | ICD-10-CM

## 2024-02-29 DIAGNOSIS — J45909 Unspecified asthma, uncomplicated: Secondary | ICD-10-CM | POA: Diagnosis not present

## 2024-02-29 DIAGNOSIS — E66812 Obesity, class 2: Secondary | ICD-10-CM | POA: Diagnosis not present

## 2024-02-29 MED ORDER — LANCETS MISC: 1.0000 | 0 refills | Status: AC

## 2024-02-29 MED ORDER — BLOOD GLUCOSE MONITORING SUPPL DEVI
1.0000 | 0 refills | Status: AC
Start: 2024-02-29 — End: ?

## 2024-02-29 MED ORDER — LANCET DEVICE MISC: 1.0000 | 0 refills | Status: DC

## 2024-02-29 MED ORDER — HYDROCODONE-ACETAMINOPHEN 10-325 MG PO TABS: 1.0000 | ORAL_TABLET | Freq: Three times a day (TID) | ORAL | 0 refills | Status: AC | PRN

## 2024-02-29 MED ORDER — BLOOD GLUCOSE TEST VI STRP: 1.0000 | ORAL_STRIP | 0 refills | Status: AC

## 2024-02-29 NOTE — Progress Notes (Signed)
 Subjective   Patient ID: Evelyn Watts, female    DOB: Nov 11, 1975, 48 y.o.   MRN: [id number]  Chief Complaint  Patient presents with   Diabetes    Referring provider: Oley Bascom RAMAN, NP  Evelyn Watts is a 48 y.o. female with Past Medical History: No date: Allergies No date: Asthma No date: Diabetes mellitus without complication (HCC) No date: Essential hypertension   HPI    Patient presents today for a follow-up visit.  A1c in office today continues to stay elevated at 12.5 at most recent check.  Referral has been placed to endocrinology.  Referral has also been placed to pharmacy for medication management.  Patient is currently been switched to NovoLog  and Lantus .  Denies f/c/s, n/v/d, hemoptysis, PND, leg swelling. Denies chest pain or edema.   Note: Patient was recently mid to the hospital with sepsis due to wound infection.  Home health coming for wound vac changes after I & D.   Patient is requesting a wheelchair.  She does currently have a rollator but does currently have a wound VAC in place.  She would benefit from a wheelchair so that her husband could transport her with wound VAC to appointments etc.    Allergies  Allergen Reactions   Latex Rash   Morphine And Codeine  Rash   Tape Rash    Immunization History  Administered Date(s) Administered   Influenza, Seasonal, Injecte, Preservative Fre 06/08/2023   Influenza,inj,Quad PF,6+ Mos 02/14/2018    Tobacco History: Social History   Tobacco Use  Smoking Status Never  Smokeless Tobacco Never   Counseling given: Not Answered   Outpatient Encounter Medications as of 02/29/2024  Medication Sig   albuterol  (VENTOLIN  HFA) 108 (90 Base) MCG/ACT inhaler Inhale 2 puffs into the lungs every 4 (four) hours as needed for wheezing.   Blood Glucose Monitoring Suppl (BLOOD GLUCOSE MONITOR SYSTEM) w/Device KIT Use to check blood sugar up to four times daily   Blood Glucose Monitoring Suppl DEVI 1 each by Does  not apply route as directed. Dispense based on patient and insurance preference. Use up to four times daily as directed. (FOR ICD-10 E10.9, E11.9).   cetirizine  (ZYRTEC ) 10 MG tablet Take 1 tablet (10 mg total) by mouth daily.   Ferrous Sulfate  90 (18 Fe) MG TABS Take 1 tablet by mouth daily.   gabapentin  (NEURONTIN ) 300 MG capsule TAKE 1 CAPSULE THREE TIMES DAILY   Glucose Blood (BLOOD GLUCOSE TEST STRIPS) STRP Use up to four times daily as directed. (FOR ICD-10 E10.9, E11.9).   Glucose Blood (BLOOD GLUCOSE TEST STRIPS) STRP 1 each by Does not apply route as directed. Dispense based on patient and insurance preference. Use up to four times daily as directed. (FOR ICD-10 E10.9, E11.9).   HYDROcodone -acetaminophen  (NORCO) 10-325 MG tablet Take 1 tablet by mouth every 8 (eight) hours as needed for up to 5 days.   IBU 600 MG tablet TAKE 1 TABLET EVERY 8 HOURS AS NEEDED   insulin  aspart (NOVOLOG ) 100 UNIT/ML FlexPen Inject 0-6 Units into the skin 3 (three) times daily with meals. Check Blood Glucose (BG) and inject per scale: BG <150= 0 unit; BG 150-200= 1 unit; BG 201-250= 2 unit; BG 251-300= 3 unit; BG 301-350= 4 unit; BG 351-400= 5 unit; BG >400= 6 unit and Call Primary Care.   Insulin  Glargine Solostar (LANTUS ) 100 UNIT/ML Solostar Pen Inject 20 Units into the skin 2 (two) times daily.   Insulin  Pen Needle (PEN NEEDLES) 32G X 4 MM  MISC Use as directed to inject insulin  twice daily.   Lancet Device MISC 1 each by Does not apply route as directed. Dispense based on patient and insurance preference. Use up to four times daily as directed. (FOR ICD-10 E10.9, E11.9).   Lancet Device MISC 1 each by Does not apply route as directed. Dispense based on patient and insurance preference. Use up to four times daily as directed. (FOR ICD-10 E10.9, E11.9).   Lancets MISC Use up to four times daily as directed. (FOR ICD-10 E10.9, E11.9).   Lancets MISC 1 each by Does not apply route as directed. Dispense based on  patient and insurance preference. Use up to four times daily as directed. (FOR ICD-10 E10.9, E11.9).   lisinopril  (ZESTRIL ) 20 MG tablet Take 1 tablet (20 mg total) by mouth daily.   metFORMIN  (GLUCOPHAGE -XR) 500 MG 24 hr tablet Take 2 tablets (1,000 mg total) by mouth 2 (two) times daily with a meal.   methocarbamol  (ROBAXIN ) 500 MG tablet Take 1 tablet (500 mg total) by mouth every 6 (six) hours as needed for muscle spasms.   naproxen  (NAPROSYN ) 500 MG tablet TAKE 1 TABLET TWICE DAILY WITH MEALS   rosuvastatin  (CRESTOR ) 40 MG tablet Take 1 tablet (40 mg total) by mouth daily.   Semaglutide , 1 MG/DOSE, 4 MG/3ML SOPN Inject 1 mg into the skin once a week.   acetaminophen  (TYLENOL ) 500 MG tablet Take 2 tablets (1,000 mg total) by mouth every 6 (six) hours as needed for mild pain (pain score 1-3), fever or headache. (Patient not taking: Reported on 02/29/2024)   oxyCODONE  (OXY IR/ROXICODONE ) 5 MG immediate release tablet Take 1-3 tablets (5-15 mg total) by mouth every 4 (four) hours as needed for moderate pain (pain score 4-6) or severe pain (pain score 7-10) (5mg  for moderate pain, 10mg  for severe pain; 15 mg for dressing changes). (Patient not taking: Reported on 02/29/2024)   No facility-administered encounter medications on file as of 02/29/2024.    Review of Systems  Review of Systems  Constitutional: Negative.   HENT: Negative.    Cardiovascular: Negative.   Gastrointestinal: Negative.   Allergic/Immunologic: Negative.   Neurological: Negative.   Psychiatric/Behavioral: Negative.       Objective:   BP (!) 155/83   Pulse 82   Wt 268 lb (121.6 kg)   SpO2 98%   BMI 37.38 kg/m   Wt Readings from Last 5 Encounters:  02/29/24 268 lb (121.6 kg)  02/19/24 283 lb 8.2 oz (128.6 kg)  11/09/23 270 lb (122.5 kg)  09/12/23 265 lb (120.2 kg)  06/08/23 265 lb 3.2 oz (120.3 kg)     Physical Exam Vitals and nursing note reviewed.  Constitutional:      General: She is not in acute  distress.    Appearance: She is well-developed.  Cardiovascular:     Rate and Rhythm: Normal rate and regular rhythm.  Pulmonary:     Effort: Pulmonary effort is normal.     Breath sounds: Normal breath sounds.  Neurological:     Mental Status: She is alert and oriented to person, place, and time.       Assessment & Plan:   Pain associated with wound -     HYDROcodone -Acetaminophen ; Take 1 tablet by mouth every 8 (eight) hours as needed for up to 5 days.  Dispense: 15 tablet; Refill: 0  Type 2 diabetes mellitus with hyperglycemia, with long-term current use of insulin  [facility]) -     Ambulatory referral to Endocrinology -  CBC -     Comprehensive metabolic panel with GFR  Other orders -     Blood Glucose Monitoring Suppl; 1 each by Does not apply route as directed. Dispense based on patient and insurance preference. Use up to four times daily as directed. (FOR ICD-10 E10.9, E11.9).  Dispense: 1 each; Refill: 0 -     Blood Glucose Test; 1 each by Does not apply route as directed. Dispense based on patient and insurance preference. Use up to four times daily as directed. (FOR ICD-10 E10.9, E11.9).  Dispense: 100 strip; Refill: 0 -     Lancet Device; 1 each by Does not apply route as directed. Dispense based on patient and insurance preference. Use up to four times daily as directed. (FOR ICD-10 E10.9, E11.9).  Dispense: 1 each; Refill: 0 -     Lancets; 1 each by Does not apply route as directed. Dispense based on patient and insurance preference. Use up to four times daily as directed. (FOR ICD-10 E10.9, E11.9).  Dispense: 100 each; Refill: 0     Return in about 3 months (around 05/31/2024).   Bascom GORMAN Borer, NP 02/29/2024

## 2024-03-01 ENCOUNTER — Other Ambulatory Visit: Payer: Self-pay

## 2024-03-01 ENCOUNTER — Ambulatory Visit: Payer: Self-pay | Admitting: Nurse Practitioner

## 2024-03-01 DIAGNOSIS — E1165 Type 2 diabetes mellitus with hyperglycemia: Secondary | ICD-10-CM

## 2024-03-01 LAB — COMPREHENSIVE METABOLIC PANEL WITH GFR
ALT: 8 IU/L (ref 0–32)
AST: 14 IU/L (ref 0–40)
Albumin: 3.1 g/dL — ABNORMAL LOW (ref 3.9–4.9)
Alkaline Phosphatase: 130 IU/L — ABNORMAL HIGH (ref 41–116)
BUN/Creatinine Ratio: 25 — ABNORMAL HIGH (ref 9–23)
BUN: 13 mg/dL (ref 6–24)
Bilirubin Total: <0.2 mg/dL (ref 0.0–1.2)
CO2: 24 mmol/L (ref 20–29)
Calcium: 9.4 mg/dL (ref 8.7–10.2)
Chloride: 100 mmol/L (ref 96–106)
Creatinine, Ser: 0.51 mg/dL — ABNORMAL LOW (ref 0.57–1.00)
Globulin, Total: 3.6 g/dL (ref 1.5–4.5)
Glucose: 181 mg/dL — ABNORMAL HIGH (ref 70–99)
Potassium: 4.6 mmol/L (ref 3.5–5.2)
Sodium: 136 mmol/L (ref 134–144)
Total Protein: 6.7 g/dL (ref 6.0–8.5)
eGFR: 117 mL/min/1.73 (ref >59)

## 2024-03-01 LAB — CBC
Hematocrit: 30.3 % — ABNORMAL LOW (ref 34.0–46.6)
Hemoglobin: 9.4 g/dL — ABNORMAL LOW (ref 11.1–15.9)
MCH: 24.2 pg — ABNORMAL LOW (ref 26.6–33.0)
MCHC: 31 g/dL — ABNORMAL LOW (ref 31.5–35.7)
MCV: 78 fL — ABNORMAL LOW (ref 79–97)
Platelets: 391 x10E3/uL (ref 150–450)
RBC: 3.88 x10E6/uL (ref 3.77–5.28)
RDW: 17 % — ABNORMAL HIGH (ref 11.7–15.4)
WBC: 7.1 x10E3/uL (ref 3.4–10.8)

## 2024-03-01 MED ORDER — INSULIN GLARGINE SOLOSTAR 100 UNIT/ML ~~LOC~~ SOPN: 20.0000 [IU] | PEN_INJECTOR | Freq: Two times a day (BID) | SUBCUTANEOUS | 11 refills | Status: AC

## 2024-03-01 MED ORDER — INSULIN ASPART 100 UNIT/ML FLEXPEN: 0.0000 [IU] | PEN_INJECTOR | Freq: Three times a day (TID) | SUBCUTANEOUS | 1 refills | Status: DC

## 2024-03-01 NOTE — Progress Notes (Signed)
 03/01/2024 Name: Evelyn Watts MRN: [id number] DOB: [birth year]  Chief Complaint  Patient presents with   Diabetes    Evelyn Watts is a 48 y.o. year old female who presented for a telephone visit.   They were referred to the pharmacist by their PCP for assistance in managing diabetes, hypertension, and hyperlipidemia.   Subjective: Patient was lost to follow up with pharmacy since July 2025. She was admitted from 02/10/24 to 02/19/24 with necrotizing fascitis. She was discharged with a wound vac. A1C during admission was 12.5%. She was discharged on insulin  glargine (Lantus ) 20 units BID, Novolog  TID with meals per SS, and was instructed to resume Ozempic  and metformin  at home. She saw PCP on 02/29/24. BP was 155/83 mmHg.  Today, patient reports doing well. She says her husband is managing her medications. He works from home. He is giving her all her doses of Lantus  and Novolog . He has been trying to dose it how they did in the hospital, since she believes her sugars were controlled while admitted. However, they did not get a glucometer until today, so they have not been able to monitor her sugars. She states she did not resume Ozempic  or metformin . She states there is not any Ozempic  left at her home, even though it was last dispensed for an 86 ds on 01/22/24. Her husband was at home with her during this call, but declined to review her medications with me.    Care Team: Primary Care Provider: Oley Bascom RAMAN, NP ; Next Scheduled Visit: 05/31/24  Medication Access/Adherence  Current Pharmacy:  [id number] [address], [place] - [address] AT [address] [place] [address] Phone: [phone] Fax: [phone]  [facility] - [address] [place] - [address] [place] [address] Phone: [phone] Fax: [phone]  D[facility] LONG - [facility] [address] [place]  [address] Phone: [phone] Fax: [phone]  [facility] - [place], [place] - [address] [facility] [address] [place] [address] Phone: [phone] Fax: [phone]   Patient reports affordability concerns with their medications: No  - copays usually $0  Patient reports access/transportation concerns to their pharmacy: Yes  - Typically use mail order pharmacies. Historically have used [facility]. However, has some recent dispenses from [facility]. - FL3+ sensors have been mailed out multiple times from [facility] pharmacy and patient has not received them. Thinks they may be stolen from her porch.  Patient reports adherence concerns with their medications:  - hx of running out of medications without appropriate communication   Diabetes:  Current medications: Lantus  20 units twice daily (in the AM and PM), Novolog  0-6 units TID with meals (reports she has been taking 4-5 units with each meal- dosed right before or right after eating) Medications tried in the past: dulaglutide (upset stomach, nausea), Victoza  (inadequate response)  Reports she did not restart Ozempic  or metformin  after coming home from the hospital - per discharge summary she should be taking Ozempic  1 mg weekly and metformin  XR 1000 mg BID.  Using glucometer; testing in the morning, after meals, and at bedtime. Accu Chek meter - received today (delivered from [employer] [facility]). 03/01/24: 132 mg/dL (right after eating) Will re-attempt to order FL3+ sensors ~ Dec 2025  Patient denies hypoglycemic s/sx including dizziness, shakiness, sweating. Patient denies hyperglycemic symptoms including polyuria, polydipsia, polyphagia, nocturia, neuropathy, blurred vision.  Current meal patterns: Reports since she has been home she has been eating baked chicken, green beans, corn. She is drinking water  throughout the day, no other drinks.   Current physical activity: currently limited, with  wound vac  Hypertension:  Current medications: lisinopril  20 mg daily (current supply per fill hx, patient states she is taking)  Hyperlipidemia/ASCVD Risk Reduction  Current lipid lowering medications: rosuvastatin  20 mg daily (current supply per fill hx, patient states she is taking)  The 10-year ASCVD risk score (Arnett DK, et al., 2019) is: 6%   Values used to calculate the score:     Age: 55 years     Clincally relevant sex: Female     Is Non-Hispanic African American: No     Diabetic: Yes     Tobacco smoker: No     Systolic Blood Pressure: 155 mmHg     Is BP treated: Yes     HDL Cholesterol: 30 mg/dL     Total Cholesterol: 163 mg/dL   Objective:  BP Readings from Last 3 Encounters:  02/29/24 (!) 155/83  02/19/24 (!) 148/65  11/09/23 121/74    Lab Results  Component Value Date   HGBA1C 12.5 (H) 02/11/2024   HGBA1C 11.6 (A) 11/09/2023   HGBA1C 12.8 (A) 06/08/2023    Lab Results  Component Value Date   CREATININE 0.51 (L) 02/29/2024   BUN 13 02/29/2024   NA 136 02/29/2024   K 4.6 02/29/2024   CL 100 02/29/2024   CO2 24 02/29/2024    Lab Results  Component Value Date   CHOL 163 09/12/2023   HDL 30 (L) 09/12/2023   LDLCALC 73 09/12/2023   LDLDIRECT 79 09/12/2023   TRIG 380 (H) 09/12/2023   CHOLHDL 5.4 (H) 09/12/2023    Medications Reviewed Today     Reviewed by Brinda Lorain SQUIBB, RPH (Pharmacist) on 03/01/24 at 1447  Med List Status: <None>   Medication Order Taking? Sig Documenting Provider Last Dose Status Informant  acetaminophen  (TYLENOL ) 500 MG tablet [id number]  Take 2 tablets (1,000 mg total) by mouth every 6 (six) hours as needed for mild pain (pain score 1-3), fever or headache.  Patient not taking: Reported on 02/29/2024   Vicci Burnard SAUNDERS, PA-C  Active   albuterol  (VENTOLIN  HFA) 108 [phone] Base) MCG/ACT inhaler [id number]  Inhale 2 puffs into the lungs every 4 (four) hours as needed for wheezing. Oley Bascom RAMAN, NP  Active Self, Pharmacy Records   Blood Glucose Monitoring Suppl (BLOOD GLUCOSE MONITOR SYSTEM) w/Device KIT [id number]  Use to check blood sugar up to four times daily Barbarann Nest, MD  Active   Blood Glucose Monitoring Suppl DEVI [id number]  1 each by Does not apply route as directed. Dispense based on patient and insurance preference. Use up to four times daily as directed. (FOR ICD-10 E10.9, E11.9). Oley Bascom RAMAN, NP  Active   cetirizine  (ZYRTEC ) 10 MG tablet [id number]  Take 1 tablet (10 mg total) by mouth daily. Oley Bascom RAMAN, NP  Active Self, Pharmacy Records  Ferrous Sulfate  90 (18 Fe) MG TABS [id number]  Take 1 tablet by mouth daily. Oley Bascom RAMAN, NP  Active Self, Pharmacy Records  gabapentin  (NEURONTIN ) 300 MG capsule [id number]  TAKE 1 CAPSULE THREE TIMES DAILY Nichols, Tonya S, NP  Active Self, Pharmacy Records  Glucose Blood (BLOOD GLUCOSE TEST STRIPS) STRP [id number]  Use up to four times daily as directed. (FOR ICD-10 E10.9, E11.9). Barbarann Nest, MD  Active   Glucose Blood (BLOOD  GLUCOSE TEST STRIPS) STRP [id number]  1 each by Does not apply route as directed. Dispense based on patient and insurance preference. Use up to four times daily as directed. (FOR ICD-10 E10.9, E11.9). Oley Bascom RAMAN, NP  Active   HYDROcodone -acetaminophen  (NORCO) 10-325 MG tablet [id number]  Take 1 tablet by mouth every 8 (eight) hours as needed for up to 5 days. Oley Bascom RAMAN, NP  Active   IBU 600 MG tablet [id number]  TAKE 1 TABLET EVERY 8 HOURS AS NEEDED Nichols, Tonya S, NP  Active   insulin  aspart (NOVOLOG ) 100 UNIT/ML FlexPen [id number] Yes Inject 0-6 Units into the skin 3 (three) times daily with meals. Check Blood Glucose (BG) and inject per scale: BG <150= 0 unit; BG 150-200= 1 unit; BG 201-250= 2 unit; BG 251-300= 3 unit; BG 301-350= 4 unit; BG 351-400= 5 unit; BG >400= 6 unit and Call Primary Care. Barbarann Nest, MD  Active   Insulin  Glargine Solostar (LANTUS ) 100 UNIT/ML Solostar Pen [id number] Yes Inject 20 Units into the  skin 2 (two) times daily. Barbarann Nest, MD  Active   Insulin  Pen Needle (PEN NEEDLES) 32G X 4 MM MISC [id number]  Use as directed to inject insulin  twice daily. Barbarann Nest, MD  Active   Lancet Device MISC [id number]  1 each by Does not apply route as directed. Dispense based on patient and insurance preference. Use up to four times daily as directed. (FOR ICD-10 E10.9, E11.9). Barbarann Nest, MD  Active   Lancet Device MISC [id number]  1 each by Does not apply route as directed. Dispense based on patient and insurance preference. Use up to four times daily as directed. (FOR ICD-10 E10.9, E11.9). Oley Bascom RAMAN, NP  Active   Lancets MISC [id number]  Use up to four times daily as directed. (FOR ICD-10 E10.9, E11.9). Barbarann Nest, MD  Active   Lancets MISC [id number]  1 each by Does not apply route as directed. Dispense based on patient and insurance preference. Use up to four times daily as directed. (FOR ICD-10 E10.9, E11.9). Oley Bascom RAMAN, NP  Active   lisinopril  (ZESTRIL ) 20 MG tablet [id number] Yes Take 1 tablet (20 mg total) by mouth daily. Oley Bascom RAMAN, NP  Active Self, Pharmacy Records  metFORMIN  (GLUCOPHAGE -XR) 500 MG 24 hr tablet [id number]  Take 2 tablets (1,000 mg total) by mouth 2 (two) times daily with a meal.  Patient not taking: Reported on 03/01/2024   Oley Bascom RAMAN, NP  Active Self, Pharmacy Records  methocarbamol  (ROBAXIN ) 500 MG tablet [id number]  Take 1 tablet (500 mg total) by mouth every 6 (six) hours as needed for muscle spasms. Vicci Burnard SAUNDERS, PA-C  Active   naproxen  (NAPROSYN ) 500 MG tablet [id number]  TAKE 1 TABLET TWICE DAILY WITH MEALS Nichols, Tonya S, NP  Active Self, Pharmacy Records  oxyCODONE  (OXY IR/ROXICODONE ) 5 MG immediate release tablet [id number]  Take 1-3 tablets (5-15 mg total) by mouth every 4 (four) hours as needed for moderate pain (pain score 4-6) or severe pain (pain score 7-10) (5mg  for moderate pain, 10mg  for severe pain; 15 mg for dressing  changes).  Patient not taking: Reported on 02/29/2024   Vicci Burnard SAUNDERS, PA-C  Active   rosuvastatin  (CRESTOR ) 40 MG tablet [id number] Yes Take 1 tablet (40 mg total) by mouth daily. Oley Bascom RAMAN, NP  Active Self, Pharmacy Records  Semaglutide , 1 MG/DOSE, 4 MG/3ML SOPN [id number]  Inject 1 mg into the skin once a week.  Patient not taking:  Reported on 03/01/2024   Oley Bascom RAMAN, NP  Active Self, Pharmacy Records              Assessment/Plan:   Diabetes: - Currently uncontrolled with last A1c 12.5% above goal < 7%. Patient is taking basal/bolus insulin  at this time. States that she was not told to restart Ozempic  or metformin  at discharge. She insists that her blood sugars have been controlled on her current regimen (though today is the first time she has been able to monitor since discharge), and therefore, she is not willing to restart Ozempic  or metformin  at this time. Offered to discuss with her husband who manages her medications, but this was declined. Encouraged patient to monitor with glucometer TID and I will follow up in 3-4 weeks to assess control. - Reviewed long term cardiovascular and renal outcomes of uncontrolled blood sugar - Reviewed goal A1c, goal fasting, and goal 2 hour post prandial glucose - Reviewed dietary modifications including drinking more water , focusing on intake of protein and nonstarchy vegetables - Reviewed lifestyle modifications including: increasing physical activity. Commended for daily walking.  - Recommend to continue Lantus  20 units BID - Recommend to continue Novolog  0-6 units TID before meals per sliding scale (BG <150= 0 unit; BG 150-200= 1 unit; BG 201-250= 2 unit; BG 251-300= 3 unit; BG 301-350= 4 unit; BG 351-400= 5 unit; BG >400= 6 unit) - Recommended restarting Ozempic  and metformin , but patient declined. Will follow-up with BG control in 3-4 weeks. - Recommend to check glucose TID with glucometer. Will re-attempt to access CGM at  follow-up.  - A1C due Jan 2026   Hypertension: - Currently difficult to assess BP control. Elevated at last OV, but patient was in pain from wound. Previously controlled on lisinopril  20 mg daily.   Hyperlipidemia/ASCVD Risk Reduction: - Currently uncontrolled with last LDL 79 mg/dL, above goal <29 mg/dL but improved from 889 mg/dL. TG elevated to 380 mg/dL, above goal < 849 mg/dL, but improved from 543 mg/dL. May benefit from additional lipid lowering therapies in the future. - Continue rosuvastatin  20 mg daily   Patient requests follow-up on her order for a wheelchair. Will discuss with PCP and CMA.   Follow Up Plan:  Pharmacist telephone 03/29/24, PCP 06/01/23   Lorain Baseman, PharmD [facility] [phone]

## 2024-03-04 ENCOUNTER — Telehealth: Payer: Self-pay

## 2024-03-04 ENCOUNTER — Other Ambulatory Visit: Payer: Self-pay | Admitting: Nurse Practitioner

## 2024-03-04 DIAGNOSIS — I10 Essential (primary) hypertension: Secondary | ICD-10-CM | POA: Diagnosis not present

## 2024-03-04 DIAGNOSIS — Z794 Long term (current) use of insulin: Secondary | ICD-10-CM | POA: Diagnosis not present

## 2024-03-04 DIAGNOSIS — K047 Periapical abscess without sinus: Secondary | ICD-10-CM

## 2024-03-04 DIAGNOSIS — J45909 Unspecified asthma, uncomplicated: Secondary | ICD-10-CM | POA: Diagnosis not present

## 2024-03-04 DIAGNOSIS — A419 Sepsis, unspecified organism: Secondary | ICD-10-CM | POA: Diagnosis not present

## 2024-03-04 DIAGNOSIS — D649 Anemia, unspecified: Secondary | ICD-10-CM | POA: Diagnosis not present

## 2024-03-04 DIAGNOSIS — E66812 Obesity, class 2: Secondary | ICD-10-CM | POA: Diagnosis not present

## 2024-03-04 DIAGNOSIS — L03115 Cellulitis of right lower limb: Secondary | ICD-10-CM | POA: Diagnosis not present

## 2024-03-04 DIAGNOSIS — M726 Necrotizing fasciitis: Secondary | ICD-10-CM | POA: Diagnosis not present

## 2024-03-04 DIAGNOSIS — E119 Type 2 diabetes mellitus without complications: Secondary | ICD-10-CM | POA: Diagnosis not present

## 2024-03-04 NOTE — Patient Outreach (Signed)
 Call placed to patient for quick follow-up. Patient confirms she received pain medication and glucometer ordered by PCP. She reports she has been monitoring her blood sugar:  03/01/24 11:58 AM: 123 03/02/24 7:41 AM: 163 03/03/24 1:19 PM: 305 (after eating lunch)  Advised monitoring blood sugar 3x/day before meals. Patient reports HH RN is coming today to change wound vac. Patient reports she is being told by [facility] the wound looks good. Reminded patient of upcoming appointment with [facility] 03/18/24.   CMRN follow-up scheduled for 03/19/24.  Rosaline Finlay, RN MSN [place]  [facility] Population Health RN Care Manager Direct Dial: [phone]  Fax: [phone]

## 2024-03-05 NOTE — Telephone Encounter (Signed)
 Please advise [facility]

## 2024-03-07 DIAGNOSIS — E66812 Obesity, class 2: Secondary | ICD-10-CM | POA: Diagnosis not present

## 2024-03-07 DIAGNOSIS — L03115 Cellulitis of right lower limb: Secondary | ICD-10-CM | POA: Diagnosis not present

## 2024-03-07 DIAGNOSIS — A419 Sepsis, unspecified organism: Secondary | ICD-10-CM | POA: Diagnosis not present

## 2024-03-07 DIAGNOSIS — D649 Anemia, unspecified: Secondary | ICD-10-CM | POA: Diagnosis not present

## 2024-03-07 DIAGNOSIS — M726 Necrotizing fasciitis: Secondary | ICD-10-CM | POA: Diagnosis not present

## 2024-03-07 DIAGNOSIS — I10 Essential (primary) hypertension: Secondary | ICD-10-CM | POA: Diagnosis not present

## 2024-03-07 DIAGNOSIS — J45909 Unspecified asthma, uncomplicated: Secondary | ICD-10-CM | POA: Diagnosis not present

## 2024-03-07 DIAGNOSIS — Z794 Long term (current) use of insulin: Secondary | ICD-10-CM | POA: Diagnosis not present

## 2024-03-07 DIAGNOSIS — E119 Type 2 diabetes mellitus without complications: Secondary | ICD-10-CM | POA: Diagnosis not present

## 2024-03-08 ENCOUNTER — Other Ambulatory Visit: Payer: Self-pay | Admitting: Nurse Practitioner

## 2024-03-08 DIAGNOSIS — K047 Periapical abscess without sinus: Secondary | ICD-10-CM

## 2024-03-09 DIAGNOSIS — I10 Essential (primary) hypertension: Secondary | ICD-10-CM | POA: Diagnosis not present

## 2024-03-09 DIAGNOSIS — D649 Anemia, unspecified: Secondary | ICD-10-CM | POA: Diagnosis not present

## 2024-03-09 DIAGNOSIS — J45909 Unspecified asthma, uncomplicated: Secondary | ICD-10-CM | POA: Diagnosis not present

## 2024-03-09 DIAGNOSIS — A419 Sepsis, unspecified organism: Secondary | ICD-10-CM | POA: Diagnosis not present

## 2024-03-09 DIAGNOSIS — L03115 Cellulitis of right lower limb: Secondary | ICD-10-CM | POA: Diagnosis not present

## 2024-03-09 DIAGNOSIS — E66812 Obesity, class 2: Secondary | ICD-10-CM | POA: Diagnosis not present

## 2024-03-09 DIAGNOSIS — M726 Necrotizing fasciitis: Secondary | ICD-10-CM | POA: Diagnosis not present

## 2024-03-09 DIAGNOSIS — E119 Type 2 diabetes mellitus without complications: Secondary | ICD-10-CM | POA: Diagnosis not present

## 2024-03-09 DIAGNOSIS — Z794 Long term (current) use of insulin: Secondary | ICD-10-CM | POA: Diagnosis not present

## 2024-03-11 DIAGNOSIS — E119 Type 2 diabetes mellitus without complications: Secondary | ICD-10-CM | POA: Diagnosis not present

## 2024-03-11 DIAGNOSIS — J45909 Unspecified asthma, uncomplicated: Secondary | ICD-10-CM | POA: Diagnosis not present

## 2024-03-11 DIAGNOSIS — L03115 Cellulitis of right lower limb: Secondary | ICD-10-CM | POA: Diagnosis not present

## 2024-03-11 DIAGNOSIS — E66812 Obesity, class 2: Secondary | ICD-10-CM | POA: Diagnosis not present

## 2024-03-11 DIAGNOSIS — D649 Anemia, unspecified: Secondary | ICD-10-CM | POA: Diagnosis not present

## 2024-03-11 DIAGNOSIS — A419 Sepsis, unspecified organism: Secondary | ICD-10-CM | POA: Diagnosis not present

## 2024-03-11 DIAGNOSIS — I10 Essential (primary) hypertension: Secondary | ICD-10-CM | POA: Diagnosis not present

## 2024-03-11 DIAGNOSIS — Z794 Long term (current) use of insulin: Secondary | ICD-10-CM | POA: Diagnosis not present

## 2024-03-11 DIAGNOSIS — M726 Necrotizing fasciitis: Secondary | ICD-10-CM | POA: Diagnosis not present

## 2024-03-12 DIAGNOSIS — D649 Anemia, unspecified: Secondary | ICD-10-CM | POA: Diagnosis not present

## 2024-03-12 DIAGNOSIS — L03115 Cellulitis of right lower limb: Secondary | ICD-10-CM | POA: Diagnosis not present

## 2024-03-12 DIAGNOSIS — E66812 Obesity, class 2: Secondary | ICD-10-CM | POA: Diagnosis not present

## 2024-03-12 DIAGNOSIS — Z794 Long term (current) use of insulin: Secondary | ICD-10-CM | POA: Diagnosis not present

## 2024-03-12 DIAGNOSIS — E119 Type 2 diabetes mellitus without complications: Secondary | ICD-10-CM | POA: Diagnosis not present

## 2024-03-12 DIAGNOSIS — J45909 Unspecified asthma, uncomplicated: Secondary | ICD-10-CM | POA: Diagnosis not present

## 2024-03-12 DIAGNOSIS — M726 Necrotizing fasciitis: Secondary | ICD-10-CM | POA: Diagnosis not present

## 2024-03-12 DIAGNOSIS — A419 Sepsis, unspecified organism: Secondary | ICD-10-CM | POA: Diagnosis not present

## 2024-03-12 DIAGNOSIS — I10 Essential (primary) hypertension: Secondary | ICD-10-CM | POA: Diagnosis not present

## 2024-03-13 DIAGNOSIS — L03115 Cellulitis of right lower limb: Secondary | ICD-10-CM | POA: Diagnosis not present

## 2024-03-13 DIAGNOSIS — M726 Necrotizing fasciitis: Secondary | ICD-10-CM | POA: Diagnosis not present

## 2024-03-13 DIAGNOSIS — Z794 Long term (current) use of insulin: Secondary | ICD-10-CM | POA: Diagnosis not present

## 2024-03-13 DIAGNOSIS — E119 Type 2 diabetes mellitus without complications: Secondary | ICD-10-CM | POA: Diagnosis not present

## 2024-03-13 DIAGNOSIS — I10 Essential (primary) hypertension: Secondary | ICD-10-CM | POA: Diagnosis not present

## 2024-03-13 DIAGNOSIS — A419 Sepsis, unspecified organism: Secondary | ICD-10-CM | POA: Diagnosis not present

## 2024-03-13 DIAGNOSIS — D649 Anemia, unspecified: Secondary | ICD-10-CM | POA: Diagnosis not present

## 2024-03-13 DIAGNOSIS — J45909 Unspecified asthma, uncomplicated: Secondary | ICD-10-CM | POA: Diagnosis not present

## 2024-03-13 DIAGNOSIS — E66812 Obesity, class 2: Secondary | ICD-10-CM | POA: Diagnosis not present

## 2024-03-15 DIAGNOSIS — Z794 Long term (current) use of insulin: Secondary | ICD-10-CM | POA: Diagnosis not present

## 2024-03-15 DIAGNOSIS — I10 Essential (primary) hypertension: Secondary | ICD-10-CM | POA: Diagnosis not present

## 2024-03-15 DIAGNOSIS — A419 Sepsis, unspecified organism: Secondary | ICD-10-CM | POA: Diagnosis not present

## 2024-03-15 DIAGNOSIS — J45909 Unspecified asthma, uncomplicated: Secondary | ICD-10-CM | POA: Diagnosis not present

## 2024-03-15 DIAGNOSIS — E119 Type 2 diabetes mellitus without complications: Secondary | ICD-10-CM | POA: Diagnosis not present

## 2024-03-15 DIAGNOSIS — L03115 Cellulitis of right lower limb: Secondary | ICD-10-CM | POA: Diagnosis not present

## 2024-03-15 DIAGNOSIS — E66812 Obesity, class 2: Secondary | ICD-10-CM | POA: Diagnosis not present

## 2024-03-15 DIAGNOSIS — D649 Anemia, unspecified: Secondary | ICD-10-CM | POA: Diagnosis not present

## 2024-03-15 DIAGNOSIS — M726 Necrotizing fasciitis: Secondary | ICD-10-CM | POA: Diagnosis not present

## 2024-03-19 ENCOUNTER — Telehealth: Payer: Self-pay

## 2024-03-19 DIAGNOSIS — Z794 Long term (current) use of insulin: Secondary | ICD-10-CM | POA: Diagnosis not present

## 2024-03-19 DIAGNOSIS — E119 Type 2 diabetes mellitus without complications: Secondary | ICD-10-CM | POA: Diagnosis not present

## 2024-03-19 DIAGNOSIS — I10 Essential (primary) hypertension: Secondary | ICD-10-CM | POA: Diagnosis not present

## 2024-03-19 DIAGNOSIS — L03115 Cellulitis of right lower limb: Secondary | ICD-10-CM | POA: Diagnosis not present

## 2024-03-19 DIAGNOSIS — E66812 Obesity, class 2: Secondary | ICD-10-CM | POA: Diagnosis not present

## 2024-03-19 DIAGNOSIS — A419 Sepsis, unspecified organism: Secondary | ICD-10-CM | POA: Diagnosis not present

## 2024-03-19 DIAGNOSIS — D649 Anemia, unspecified: Secondary | ICD-10-CM | POA: Diagnosis not present

## 2024-03-19 DIAGNOSIS — J45909 Unspecified asthma, uncomplicated: Secondary | ICD-10-CM | POA: Diagnosis not present

## 2024-03-19 DIAGNOSIS — M726 Necrotizing fasciitis: Secondary | ICD-10-CM | POA: Diagnosis not present

## 2024-03-19 NOTE — Patient Instructions (Signed)
 Avelina Laster - I am sorry I was unable to reach you today for our scheduled appointment. I work with Oley Bascom RAMAN, NP and am calling to support your healthcare needs. Please contact me at [phone] at your earliest convenience. I look forward to speaking with you soon.   Thank you,  Rosaline Finlay, RN MSN [place]  [facility] RN Care Manager Direct Dial: [phone]  Fax: [phone]

## 2024-03-19 NOTE — Patient Outreach (Signed)
 Care Coordination   03/19/2024 Name: Ellon Marasco MRN: [id number] DOB: 10-19-1975   Care Coordination Outreach Attempts:  An unsuccessful outreach was attempted for an appointment today.  Follow Up Plan:  Additional outreach attempts will be made to complete CCM follow-up visit.   Encounter Outcome:  No Answer. HIPAA compliant voicemail left requesting return call.   Rosaline Finlay, RN MSN [place]  [facility] Population Health RN Care Manager Direct Dial: [phone]  Fax: [phone]

## 2024-03-20 ENCOUNTER — Other Ambulatory Visit: Payer: Self-pay | Admitting: Nurse Practitioner

## 2024-03-20 DIAGNOSIS — E1165 Type 2 diabetes mellitus with hyperglycemia: Secondary | ICD-10-CM

## 2024-03-20 NOTE — Telephone Encounter (Signed)
 Copied from CRM #[id number]. Topic: Clinical - Medication Refill >> Mar 20, 2024  8:56 AM Selinda RAMAN wrote: Medication: Glucose Blood (BLOOD GLUCOSE TEST STRIPS) STRP, Insulin  Pen Needle (PEN NEEDLES) 32G X 4 MM MISC, Lancet Device MISC, insulin  aspart (NOVOLOG ) 100 UNIT/ML FlexPen  Has the patient contacted their pharmacy? No   This is the patient's preferred pharmacy:    [facility] - [place], [place] - [address] [facility] [address] [place] [address] Phone: [phone] Fax: [phone]  Is this the correct pharmacy for this prescription? Yes If no, delete pharmacy and type the correct one.   Has the prescription been filled recently? No  Is the patient out of the medication? Yes  Has the patient been seen for an appointment in the last year OR does the patient have an upcoming appointment? Yes  Can we respond through MyChart? Yes  The patient states she never got these prescriptions that were written in the end of October when she left the hospital because she says she told them she gets her meds from [facility] because they deliver. Please assist patient further

## 2024-03-22 ENCOUNTER — Other Ambulatory Visit: Payer: Self-pay

## 2024-03-22 NOTE — Patient Instructions (Signed)
 Avelina Laster - I am sorry I was unable to reach you today. I work with Oley Bascom RAMAN, NP and am calling to support your healthcare needs. Please contact me at [phone] at your earliest convenience. I look forward to speaking with you soon.   Thank you,  Rosaline Finlay, RN MSN [place]  [facility] RN Care Manager Direct Dial: [phone]  Fax: [phone]

## 2024-03-22 NOTE — Patient Outreach (Signed)
 Complex Care Management   Visit Note  03/22/2024  Name:  Evelyn Watts MRN: [id number] DOB: October 20, 1975  Situation: Referral received for Complex Care Management related to DM II, HTN, and cellulitis/wound care I obtained verbal consent from Patient.  Visit completed with Patient  on the phone  Background:   Past Medical History:  Diagnosis Date   Allergies    Asthma    Diabetes mellitus without complication (HCC)    Essential hypertension     Assessment: Patient Reported Symptoms:  Cognitive Cognitive Status: Able to follow simple commands, Alert and oriented to person, place, and time, Normal speech and language skills Cognitive/Intellectual Conditions Management [RPT]: None reported or documented in medical history or problem list   Health Maintenance Behaviors: Annual physical exam Health Facilitated by: Pain control, Rest  Neurological Neurological Review of Symptoms: Not assessed    HEENT HEENT Symptoms Reported: No symptoms reported HEENT Comment: Patient reports she plans to call [employer] on Monday to request list of eye doctor or dentist Tooth problem(s), Vision problem(s)  Cardiovascular Cardiovascular Symptoms Reported: No symptoms reported Does patient have uncontrolled Hypertension?: No Is patient checking Blood Pressure at home?: No Cardiovascular Management Strategies: Medication therapy, Routine screening Cardiovascular Comment: Patient reports [facility] RN has been checking her BP. She thinks it has been about 130's/40. Reminded patient to ask [facility] about OTC benefit for BP monitor when she calls on Monday.  Respiratory Respiratory Symptoms Reported: Not assesed    Endocrine Endocrine Symptoms Reported: No symptoms reported Is patient diabetic?: Yes Is patient checking blood sugars at home?: Yes List most recent blood sugar readings, include date and time of day: Patient currently unable to check blood sugar due to being out of test strips. Last check was this  morning 125 Endocrine Comment: Patient reports she is out of test strips. Note per chart review, refill was sent 03/20/24. Patient reports pharmacy representative told her that [facility] will not cover test strips until 04/08/24. Patient reports her aide is going to go purchase test strips OTC. She reports compliance with antidiabetic medications.  Gastrointestinal Gastrointestinal Symptoms Reported: No symptoms reported      Genitourinary Genitourinary Symptoms Reported: No symptoms reported    Integumentary Integumentary Symptoms Reported: Wound Additional Integumentary Details: Patient reports my wound is doing very very well. They took the wound vac off and I'm doing the wet to dry dressing. Patient reports wound is closing up. Skin Management Strategies: Dressing changes, Routine screening Skin Comment: Patient reports HH RN is now coming once a week.  Musculoskeletal Musculoskelatal Symptoms Reviewed: Difficulty walking, Joint pain Additional Musculoskeletal Details: Patient reports she is able to get up and walk around now that wound vac has been removed. Patient reports she never got wheelchair or BSC, but was told by PCP office that they were talking with insurance to get approved. Patient reports she is going to call insurance on Monday to ask about status of order. Musculoskeletal Management Strategies: Adequate rest, Exercise, Medical device (Rollator) Musculoskeletal Comment: Patient denies falls since previous CMRN visit Falls in the past year?: No Number of falls in past year: 1 or less Was there an injury with Fall?: No Fall Risk Category Calculator: 0 Patient Fall Risk Level: Low Fall Risk Patient at Risk for Falls Due to: Impaired balance/gait Fall risk Follow up: Falls evaluation completed, Education provided, Falls prevention discussed  Psychosocial Psychosocial Symptoms Reported: Not assessed          03/22/2024    PHQ2-9 Depression Screening  Little interest or  pleasure in doing things    Feeling down, depressed, or hopeless    PHQ-2 - Total Score    Trouble falling or staying asleep, or sleeping too much    Feeling tired or having little energy    Poor appetite or overeating     Feeling bad about yourself - or that you are a failure or have let yourself or your family down    Trouble concentrating on things, such as reading the newspaper or watching television    Moving or speaking so slowly that other people could have noticed.  Or the opposite - being so fidgety or restless that you have been moving around a lot more than usual    Thoughts that you would be better off dead, or hurting yourself in some way    PHQ2-9 Total Score    If you checked off any problems, how difficult have these problems made it for you to do your work, take care of things at home, or get along with other people    Depression Interventions/Treatment      There were no vitals filed for this visit.    Medications Reviewed Today     Reviewed by Arno Rosaline SQUIBB, RN (Registered Nurse) on 03/22/24 at 1046  Med List Status: <None>   Medication Order Taking? Sig Documenting Provider Last Dose Status Informant  acetaminophen  (TYLENOL ) 500 MG tablet [id number]  Take 2 tablets (1,000 mg total) by mouth every 6 (six) hours as needed for mild pain (pain score 1-3), fever or headache.  Patient not taking: Reported on 02/29/2024   Vicci Burnard SAUNDERS, PA-C  Active   albuterol  (VENTOLIN  HFA) 108 (90 Base) MCG/ACT inhaler [id number]  Inhale 2 puffs into the lungs every 4 (four) hours as needed for wheezing. Oley Bascom RAMAN, NP  Active Self, Pharmacy Records  Blood Glucose Monitoring Suppl (BLOOD GLUCOSE MONITOR SYSTEM) w/Device KIT [id number]  Use to check blood sugar up to four times daily Barbarann Nest, MD  Active   Blood Glucose Monitoring Suppl DEVI [id number]  1 each by Does not apply route as directed. Dispense based on patient and insurance preference. Use up to four times  daily as directed. (FOR ICD-10 E10.9, E11.9). Oley Bascom RAMAN, NP  Active   cetirizine  (ZYRTEC ) 10 MG tablet [id number]  Take 1 tablet (10 mg total) by mouth daily. Oley Bascom RAMAN, NP  Active Self, Pharmacy Records  Ferrous Sulfate  90 (18 Fe) MG TABS [id number]  Take 1 tablet by mouth daily. Oley Bascom RAMAN, NP  Active Self, Pharmacy Records  gabapentin  (NEURONTIN ) 300 MG capsule [id number]  TAKE 1 CAPSULE THREE TIMES DAILY Nichols, Tonya S, NP  Active Self, Pharmacy Records  Glucose Blood (BLOOD GLUCOSE TEST STRIPS) STRP [id number]  Use up to four times daily as directed. (FOR ICD-10 E10.9, E11.9). Barbarann Nest, MD  Active   Glucose Blood (BLOOD GLUCOSE TEST STRIPS) STRP [id number]  1 each by Does not apply route as directed. Dispense based on patient and insurance preference. Use up to four times daily as directed. (FOR ICD-10 E10.9, E11.9). Oley Bascom RAMAN, NP  Active   IBU 600 MG tablet [id number]  TAKE 1 TABLET EVERY 8 HOURS AS NEEDED Nichols, Tonya S, NP  Active   insulin  aspart (NOVOLOG ) 100 UNIT/ML FlexPen [id number]  Inject 0-6 Units into the skin 3 (three) times daily with meals. Check Blood Glucose (BG) and inject per scale: BG <150= 0 unit; BG 150-200= 1 unit; BG 201-250=  2 unit; BG 251-300= 3 unit; BG 301-350= 4 unit; BG 351-400= 5 unit; BG >400= 6 unit and Call Primary Care. Oley Bascom RAMAN, NP  Active   Insulin  Glargine Solostar (LANTUS ) 100 UNIT/ML Solostar Pen [id number]  Inject 20 Units into the skin 2 (two) times daily. Oley Bascom RAMAN, NP  Active   Insulin  Pen Needle (PEN NEEDLES) 32G X 4 MM MISC [id number]  Use as directed to inject insulin  twice daily. Barbarann Nest, MD  Active   Lancet Device MISC [id number]  1 each by Does not apply route as directed. Dispense based on patient and insurance preference. Use up to four times daily as directed. (FOR ICD-10 E10.9, E11.9). Barbarann Nest, MD  Active   Lancet Device MISC [id number]  1 each by Does not apply route as directed. Dispense  based on patient and insurance preference. Use up to four times daily as directed. (FOR ICD-10 E10.9, E11.9). Oley Bascom RAMAN, NP  Active   Lancets MISC [id number]  Use up to four times daily as directed. (FOR ICD-10 E10.9, E11.9). Barbarann Nest, MD  Active   Lancets MISC [id number]  1 each by Does not apply route as directed. Dispense based on patient and insurance preference. Use up to four times daily as directed. (FOR ICD-10 E10.9, E11.9). Oley Bascom RAMAN, NP  Active   lisinopril  (ZESTRIL ) 20 MG tablet [id number]  Take 1 tablet (20 mg total) by mouth daily. Oley Bascom RAMAN, NP  Active Self, Pharmacy Records  metFORMIN  (GLUCOPHAGE -XR) 500 MG 24 hr tablet [id number]  Take 2 tablets (1,000 mg total) by mouth 2 (two) times daily with a meal.  Patient not taking: Reported on 03/01/2024   Oley Bascom RAMAN, NP  Active Self, Pharmacy Records  methocarbamol  (ROBAXIN ) 500 MG tablet [id number]  Take 1 tablet (500 mg total) by mouth every 6 (six) hours as needed for muscle spasms. Vicci Burnard SAUNDERS, PA-C  Active   naproxen  (NAPROSYN ) 500 MG tablet [id number]  TAKE 1 TABLET TWICE DAILY WITH MEALS Nichols, Tonya S, NP  Active Self, Pharmacy Records  oxyCODONE  (OXY IR/ROXICODONE ) 5 MG immediate release tablet [id number]  Take 1-3 tablets (5-15 mg total) by mouth every 4 (four) hours as needed for moderate pain (pain score 4-6) or severe pain (pain score 7-10) (5mg  for moderate pain, 10mg  for severe pain; 15 mg for dressing changes).  Patient not taking: Reported on 02/29/2024   Vicci Burnard SAUNDERS, PA-C  Active   rosuvastatin  (CRESTOR ) 40 MG tablet [id number]  Take 1 tablet (40 mg total) by mouth daily. Oley Bascom RAMAN, NP  Active Self, Pharmacy Records  Semaglutide , 1 MG/DOSE, 4 MG/3ML SOPN [id number]  Inject 1 mg into the skin once a week.  Patient not taking: Reported on 03/01/2024   Oley Bascom RAMAN, NP  Active Self, Pharmacy Records            Recommendation:   Continue Current Plan of Care Patient will  call [employer] to ask about equipment (wheelchair, [facility]), in-network dentists/eye doctors, and benefit for BP monitor  Follow Up Plan:   Telephone follow up appointment date/time:  04/05/24 at 11 AM  Rosaline Finlay, RN MSN [place]  [facility] RN Care Manager Direct Dial: [phone]  Fax: [phone]

## 2024-03-22 NOTE — Patient Outreach (Signed)
 Care Coordination   03/22/2024 Name: Evelyn Watts MRN: [id number] DOB: 07-Dec-1975   Care Coordination Outreach Attempts:  A second unsuccessful outreach was attempted today to complete CCM follow-up visit.  Follow Up Plan:  Additional outreach attempts will be made to complete follow-up visit.   Encounter Outcome:  No Answer. Voicemail box not set up, unable to leave message. Message sent via MyChart requesting return call.   Rosaline Finlay, RN MSN [place]  [facility] RN Care Manager Direct Dial: [phone]  Fax: [phone]

## 2024-03-22 NOTE — Patient Instructions (Signed)
 Visit Information  Thank you for taking time to visit with me today. Please don't hesitate to contact me if I can be of assistance to you before our next scheduled appointment.  Your next care management appointment is by telephone on 04/05/24 at 11 AM  Please call the care guide team at [phone] if you need to cancel, schedule, or reschedule an appointment.   Please call the Suicide and Crisis Lifeline: 988 call 1-800-273-TALK (toll free, 24 hour hotline) if you are experiencing a Mental Health or Behavioral Health Crisis or need someone to talk to.  Rosaline Finlay, RN MSN [place]  [facility] Population [facility] RN Care Manager Direct Dial: [phone]  Fax: [phone]  Following is a copy of your care plan:   Goals Addressed             This Visit's Progress    [facility] RN Care Plan related to cellulitis   On track    Problems:  Chronic Disease Management support and education needs related to recent hospitalization for cellulitis  Goal: Over the next 30 days the Patient will not experience hospital admission as evidenced by review of electronic medical record. Hospital Admissions in last 6 months = 1 verbalize understanding of plan for management of cellulitis/wound as evidenced by patient verbalization of risks for cellulitis, wound care management, and taking medications for pain as precribed work with [facility] RN to manage wound care as evidenced by review of electronic medical record and patient or care team member report    Interventions:   Evaluation of current treatment plan related to cellulitis with wound vac in place self-management and patient's adherence to plan as established by provider. Discussed plans with patient for ongoing care management follow up and provided patient with direct contact information for care management team Provided patient with written and verbal educational materials related to cellulitis Discussed current wound care instructions and  ability to manage at home. Ensured HH RN continues to make visits to perform wound care and assessment Discussed DME request for wheelchair and BSC. Patient reports she did discuss this with PCP and is waiting for insurance approval. She plans to call her insurance agency on Monday  Patient Self-Care Activities:  Attend all scheduled provider appointments Call pharmacy for medication refills 3-7 days in advance of running out of medications Call provider office for new concerns or questions  Take medications as prescribed    Plan:  Telephone follow up appointment with care management team member scheduled for:  04/05/24 at 11 AM          [facility] RN Care Plan related to DM   On track    Problems:  Chronic Disease Management support and education needs related to DMII  Goal: Over the next 30 days the Patient will attend all scheduled medical appointments: Pharmacist 03/29/24 as evidenced by completed visit notes uploaded to EMR        demonstrate Ongoing adherence to prescribed treatment plan for DMII as evidenced by patient report of purchasing test strips and checking blood sugar at least once a day, with fasting blood sugar 80-130 work with pharmacist to obtain CGM and appropriate medication regimen as evidenced by review of electronic medical record and patient or care team member report    Interventions:   Diabetes Interventions: Assessed patient's understanding of A1c goal: <7% Provided education to patient about basic DM disease process Reviewed medications with patient and discussed importance of medication adherence Counseled on importance of regular laboratory monitoring as  prescribed Discussed plans with patient for ongoing care management follow up and provided patient with direct contact information for care management team Reviewed scheduled/upcoming provider appointments including: pharmacist 03/29/24 Advised patient, providing education and rationale, to check cbg before  meals and at bedtime and record, calling primary provider for findings outside established parameters Educated patient on goal fasting blood sugar of 80-130 Advised patient to purchase test strips OTC until insurance will cover refill 04/08/24 Lab Results  Component Value Date   HGBA1C 12.5 (H) 02/11/2024     Patient Self-Care Activities:  Attend all scheduled provider appointments Call provider office for new concerns or questions  Take medications as prescribed   schedule appointment with eye doctor check blood sugar at prescribed times: before meals and at bedtime check feet daily for cuts, sores or redness take the blood sugar log to all doctor visits drink 6 to 8 glasses of water  each day eat fish at least once per week fill half of plate with vegetables limit fast food meals to no more than 1 per week manage portion size  Plan:  Telephone follow up appointment with care management team member scheduled for:  04/05/24 at 11 AM          [facility] RN Care Plan related to HTN   On track    Problems:  Chronic Disease Management support and education needs related to HTN  Goal: Over the next 30 days the Patient will attend all scheduled medical appointments: pharmacist 04/05/24 as evidenced by completed visit notes uploaded to EMR        Obtain BP cuff through insurance as evidenced by patient report  Interventions:   Hypertension Interventions: Last practice recorded BP readings:  BP Readings from Last 3 Encounters:  02/29/24 (!) 155/83  02/19/24 (!) 148/65  11/09/23 121/74   Most recent eGFR/CrCl:  Lab Results  Component Value Date   EGFR 117 02/29/2024    No components found for: CRCL  Evaluation of current treatment plan related to hypertension self management and patient's adherence to plan as established by provider Reviewed medications with patient and discussed importance of compliance Counseled on the importance of exercise goals with target of 150 minutes  per week Provided education on prescribed diet low sodium Discussed complications of poorly controlled blood pressure such as heart disease, stroke, circulatory complications, vision complications, kidney impairment, sexual dysfunction Advised patient to continue to speak with [facility] regarding OTC benefit for BP monitor Educated patient on goal BP <130/80  Patient Self-Care Activities:  Attend all scheduled provider appointments Call provider office for new concerns or questions  Take medications as prescribed   call doctor for signs and symptoms of high blood pressure Work with insurance to obtain BP cuff  Plan:  Telephone follow up appointment with care management team member scheduled for:  04/05/24 at 11 AM

## 2024-03-25 MED ORDER — LANCET DEVICE MISC: 1.0000 | 0 refills | Status: AC

## 2024-03-25 MED ORDER — PEN NEEDLES 32G X 4 MM MISC: 3 refills | Status: AC

## 2024-03-25 MED ORDER — BLOOD GLUCOSE TEST VI STRP: 1.0000 | ORAL_STRIP | 0 refills | Status: AC

## 2024-03-25 MED ORDER — INSULIN ASPART 100 UNIT/ML FLEXPEN: 0.0000 [IU] | PEN_INJECTOR | Freq: Three times a day (TID) | SUBCUTANEOUS | 1 refills | Status: DC

## 2024-03-29 ENCOUNTER — Other Ambulatory Visit (HOSPITAL_COMMUNITY): Payer: Self-pay

## 2024-03-29 ENCOUNTER — Other Ambulatory Visit: Payer: Self-pay

## 2024-03-29 DIAGNOSIS — E1165 Type 2 diabetes mellitus with hyperglycemia: Secondary | ICD-10-CM

## 2024-03-29 MED ORDER — INSULIN ASPART 100 UNIT/ML FLEXPEN: PEN_INJECTOR | SUBCUTANEOUS | 3 refills | Status: AC

## 2024-03-29 NOTE — Progress Notes (Signed)
 03/29/2024 Name: Evelyn Watts MRN: [id number] DOB: [birth year]  Chief Complaint  Patient presents with   Diabetes    Evelyn Watts is a 48 y.o. year old female who presented for a telephone visit.   They were referred to the pharmacist by their PCP for assistance in managing diabetes, hypertension, and hyperlipidemia.   Subjective: Patient was lost to follow up with pharmacy since July 2025. She was admitted from 02/10/24 to 02/19/24 with necrotizing fascitis. She was discharged with a wound vac. A1C during admission was 12.5%. She was discharged on insulin  glargine (Lantus ) 20 units BID, Novolog  TID with meals per SS, and was instructed to resume Ozempic  and metformin  at home. She saw PCP on 02/29/24. BP was 155/83 mmHg. At pharmacy call on 03/01/24, patient reported that her husband is managing her medications and BG. They had only recently been able to monitor her sugars, but reported one value that was at goal. They were not interested in having her restart Ozempic  or metformin , because they believed her sugars were best controlled on this basal/bolus insulin  regimen in the hospital and they would like to continue that without changes.  Today, patient reports doing well. Her husband is with her to help provide the history. Reports she is running low on Novolog  though 90ds was dispensed on 02/19/24. Her husband states he had been giving her extra doses occasionally to keep her sugars in range. They also used up the test strips, because he was monitoring her very frequently. Test strips cannot be refilled through insurance until ~04/13/24. Reports that since running out of test strips, about 4 days ago, he has not been giving her full doses of insulin  due to concern for going low.  Care Team: Primary Care Provider: Oley Bascom RAMAN, NP ; Next Scheduled Visit: 05/31/24  Medication Access/Adherence  Current Pharmacy:  [facility] - [facility] [address] [place] [address] Phone: [phone] Fax: [phone]  [facility] - [place], [place] - [address] [facility] [address] [place] [address] Phone: [phone] Fax: [phone]   Patient reports affordability concerns with their medications: No  - copays usually $0  Patient reports access/transportation concerns to their pharmacy: Yes  - Typically use mail order pharmacies. Now prefer [facility] delivery. - FL3+ sensors have been mailed out multiple times from [facility] pharmacy and patient has not received them. Thinks they may be stolen from her porch. No current supply  Patient reports adherence concerns with their medications:  - hx of running out of medications without appropriate communication   Diabetes:  Current medications: Lantus  20 units twice daily (in the AM and PM), Novolog  0-6 units TID with meals (variable dosing per patient's husband, sometimes giving extra 8-10 units for high sugars) Medications tried in the past: dulaglutide (upset stomach, nausea), Victoza  (inadequate response)  Using glucometer; testing in the morning, after meals, and at bedtime. Accu Chek meter. Ran out of test strips 4 days ago. Had glucometer at home, but not able to go through recent readings. Recalls readings of 155, 140, 365, 160 mg/dL, but no times given.  Patient denies hypoglycemic s/sx including dizziness, shakiness, sweating. Patient denies hyperglycemic symptoms including polyuria, polydipsia, polyphagia, nocturia, neuropathy, blurred vision.  Current meal patterns: Reports since she has been home she has been eating baked chicken, green beans, corn. She is drinking water  throughout the day, no other drinks.   Current physical activity: currently limited s/p recovery from  leg wound  Hypertension:  Current medications: lisinopril  20 mg daily   Hyperlipidemia/ASCVD Risk Reduction  Current lipid lowering medications: rosuvastatin  20 mg daily    The 10-year ASCVD risk score (Arnett DK, et al., 2019) is: 4.7%   Values used to calculate the score:     Age: 6 years     Clincally relevant sex: Female     Is Non-Hispanic African American: No     Diabetic: Yes     Tobacco smoker: No     Systolic Blood Pressure: 138 mmHg     Is BP treated: Yes     HDL Cholesterol: 30 mg/dL     Total Cholesterol: 163 mg/dL   Objective:  BP Readings from Last 3 Encounters:  02/29/24 (!) 155/83  02/19/24 (!) 148/65  11/09/23 121/74    Lab Results  Component Value Date   HGBA1C 12.5 (H) 02/11/2024   HGBA1C 11.6 (A) 11/09/2023   HGBA1C 12.8 (A) 06/08/2023    Lab Results  Component Value Date   CREATININE 0.51 (L) 02/29/2024   BUN 13 02/29/2024   NA 136 02/29/2024   K 4.6 02/29/2024   CL 100 02/29/2024   CO2 24 02/29/2024    Lab Results  Component Value Date   CHOL 163 09/12/2023   HDL 30 (L) 09/12/2023   LDLCALC 73 09/12/2023   LDLDIRECT 79 09/12/2023   TRIG 380 (H) 09/12/2023   CHOLHDL 5.4 (H) 09/12/2023    Medications Reviewed Today     Reviewed by Brinda Lorain SQUIBB, RPH-CPP (Pharmacist) on 03/29/24 at 1721  Med List Status: <None>   Medication Order Taking? Sig Documenting Provider Last Dose Status Informant  acetaminophen  (TYLENOL ) 500 MG tablet [id number]  Take 2 tablets (1,000 mg total) by mouth every 6 (six) hours as needed for mild pain (pain score 1-3), fever or headache.  Patient not taking: Reported on 02/29/2024   Vicci Burnard SAUNDERS, PA-C  Active   albuterol  (VENTOLIN  HFA) 108 (90 Base) MCG/ACT inhaler [id number]  Inhale 2 puffs into the lungs every 4 (four) hours as needed for wheezing. Oley Bascom RAMAN, NP  Active Self, Pharmacy Records  Blood Glucose Monitoring Suppl (BLOOD GLUCOSE MONITOR SYSTEM) w/Device KIT [id number]  Use to check blood sugar up to four times daily Barbarann Nest, MD  Active   Blood Glucose Monitoring Suppl DEVI [id number]  1 each by Does not apply route as directed. Dispense based on patient and  insurance preference. Use up to four times daily as directed. (FOR ICD-10 E10.9, E11.9). Oley Bascom RAMAN, NP  Active   cetirizine  (ZYRTEC ) 10 MG tablet [id number]  Take 1 tablet (10 mg total) by mouth daily. Oley Bascom RAMAN, NP  Active Self, Pharmacy Records  Ferrous Sulfate  90 (18 Fe) MG TABS [id number]  Take 1 tablet by mouth daily. Oley Bascom RAMAN, NP  Active Self, Pharmacy Records  gabapentin  (NEURONTIN ) 300 MG capsule [id number]  TAKE 1 CAPSULE THREE TIMES DAILY Nichols, Tonya S, NP  Active Self, Pharmacy Records  Glucose Blood (BLOOD GLUCOSE TEST STRIPS) STRP [id number]  1 each by Does not apply route as directed. Dispense based on patient and insurance preference. Use up to four times daily as directed. (FOR ICD-10 E10.9, E11.9). Oley Bascom RAMAN, NP  Active   Glucose Blood (BLOOD GLUCOSE TEST STRIPS) STRP [id number]  Use up to four times daily as directed. (FOR ICD-10 E10.9, E11.9). Oley Bascom RAMAN, NP  Active   IBU 600 MG tablet [id number]  TAKE 1 TABLET EVERY 8  HOURS AS NEEDED Nichols, Tonya S, NP  Active   insulin  aspart (NOVOLOG ) 100 UNIT/ML FlexPen [id number] Yes Inject 4 units into the skin three times daily before meals plus additional correction insulin  per sliding scale: BG <150= 0 unit; BG 150-200= 2 unit; BG 201-250= 4 unit; BG 251-300= 6 unit; BG 301-350= 8 unit; BG 351-400= 10 unit; BG >400= 12 unit and Call Primary Care. Oley Bascom RAMAN, NP  Active   Insulin  Glargine Solostar (LANTUS ) 100 UNIT/ML Solostar Pen [id number] Yes Inject 20 Units into the skin 2 (two) times daily. Oley Bascom RAMAN, NP  Active   Insulin  Pen Needle (PEN NEEDLES) 32G X 4 MM MISC [id number]  Use as directed to inject insulin  twice daily. Oley Bascom RAMAN, NP  Active     Discontinued 03/29/24 1720 (Duplicate)   Lancet Device MISC [id number]  1 each by Does not apply route as directed. Dispense based on patient and insurance preference. Use up to four times daily as directed. (FOR ICD-10 E10.9, E11.9). Oley Bascom RAMAN, NP  Active     Discontinued 03/29/24 1720 (Duplicate)   Lancets MISC [id number]  1 each by Does not apply route as directed. Dispense based on patient and insurance preference. Use up to four times daily as directed. (FOR ICD-10 E10.9, E11.9). Oley Bascom RAMAN, NP  Active   lisinopril  (ZESTRIL ) 20 MG tablet [id number] Yes Take 1 tablet (20 mg total) by mouth daily. Oley Bascom RAMAN, NP  Active Self, Pharmacy Records   Patient not taking:   Discontinued 03/29/24 1721 (Patient has not taken in last 30 days) methocarbamol  (ROBAXIN ) 500 MG tablet [id number]  Take 1 tablet (500 mg total) by mouth every 6 (six) hours as needed for muscle spasms. Vicci Burnard SAUNDERS, PA-C  Active   naproxen  (NAPROSYN ) 500 MG tablet [id number]  TAKE 1 TABLET TWICE DAILY WITH MEALS Nichols, Tonya S, NP  Active Self, Pharmacy Records  oxyCODONE  (OXY IR/ROXICODONE ) 5 MG immediate release tablet [id number]  Take 1-3 tablets (5-15 mg total) by mouth every 4 (four) hours as needed for moderate pain (pain score 4-6) or severe pain (pain score 7-10) (5mg  for moderate pain, 10mg  for severe pain; 15 mg for dressing changes).  Patient not taking: Reported on 02/29/2024   Vicci Burnard SAUNDERS, PA-C  Active   rosuvastatin  (CRESTOR ) 40 MG tablet [id number]  Take 1 tablet (40 mg total) by mouth daily. Oley Bascom RAMAN, NP  Active Self, Pharmacy Records   Patient not taking:   Discontinued 03/29/24 1721 (Patient has not taken in last 30 days)             Assessment/Plan:   Diabetes: - Currently uncontrolled with last A1c 12.5% above goal < 7%. Patient is taking basal/bolus insulin  at this time. Limited SMBG data available. Encouraged continued dosing of Lantus  and provided alternative option for accessing BG testing supplies ([employer] supplies) until insurance will cover Accu Chek test strips again. Will titrate prandial insulin , as it sounds like they used up supply early and she is requiring extra doses.  - Reviewed long term  cardiovascular and renal outcomes of uncontrolled blood sugar - Reviewed goal A1c, goal fasting, and goal 2 hour post prandial glucose - Reviewed dietary modifications including drinking more water , focusing on intake of protein and nonstarchy vegetables - Reviewed lifestyle modifications including: increasing physical activity. Commended for daily walking.  - Recommend to continue Lantus  20 units BID - Recommend to inject Novolog  4 units into the skin three times daily  before meals plus additional correction insulin  per sliding scale: BG <150= 0 unit; BG 150-200= 2 unit; BG 201-250= 4 unit; BG 251-300= 6 unit; BG 301-350= 8 unit; BG 351-400= 10 unit; BG >400= 12 unit  - Recommend to check glucose TID with glucometer. Will re-attempt to access CGM at follow-up.  - A1C due Jan 2026   Hypertension: - Currently difficult to assess BP control. Elevated at last OV, but patient was in pain from wound. Previously controlled on lisinopril  20 mg daily.   Hyperlipidemia/ASCVD Risk Reduction: - Currently uncontrolled with last LDL 79 mg/dL, above goal <29 mg/dL but improved from 889 mg/dL. TG elevated to 380 mg/dL, above goal < 849 mg/dL, but improved from 543 mg/dL. May benefit from additional lipid lowering therapies in the future. - Continue rosuvastatin  20 mg daily    Follow Up Plan:  Pharmacist telephone ~2 weeks, PCP 06/01/23   Lorain Baseman, PharmD [facility] [phone]

## 2024-04-05 ENCOUNTER — Other Ambulatory Visit: Payer: Self-pay

## 2024-04-05 NOTE — Patient Outreach (Signed)
 Complex Care Management   Visit Note  04/05/2024  Name:  Evelyn Watts MRN: [id number] DOB: 1975-07-18  Situation: Referral received for Complex Care Management related to Diabetes with Complications and HTN I obtained verbal consent from Patient.  Visit completed with Patient  on the phone  Background:   Past Medical History:  Diagnosis Date   Allergies    Asthma    Diabetes mellitus without complication (HCC)    Essential hypertension     Assessment: Patient Reported Symptoms:  Cognitive Cognitive Status: Able to follow simple commands, Alert and oriented to person, place, and time, Normal speech and language skills Cognitive/Intellectual Conditions Management [RPT]: None reported or documented in medical history or problem list   Health Maintenance Behaviors: Annual physical exam, Stress management, Healthy diet Health Facilitated by: Pain control, Healthy diet, Stress management, Rest  Neurological Neurological Review of Symptoms: No symptoms reported    HEENT HEENT Symptoms Reported: No symptoms reported HEENT Comment: Patient report she called [employer] the other day regarding list of eye doctor and dentist, but they had to update her records so she was not able to get an answer. Patient reports she plans to call today after we get off the phone    Cardiovascular Cardiovascular Symptoms Reported: No symptoms reported Does patient have uncontrolled Hypertension?: No Is patient checking Blood Pressure at home?: No Cardiovascular Management Strategies: Medication therapy, Routine screening Cardiovascular Comment: Patient report she called [employer] the other day regarding order for BP monitor but they had to update her records so she was not able to get an answer. Patient reports she plans to call today after we get off the phone. Patient reports BP has been perfect when [facility] RN checks  Respiratory Respiratory Symptoms Reported: No symptoms reported Respiratory Management  Strategies: Medication therapy, Routine screening  Endocrine Endocrine Symptoms Reported: No symptoms reported Is patient diabetic?: Yes Is patient checking blood sugars at home?: Yes List most recent blood sugar readings, include date and time of day: 125 yesterday Endocrine Comment: Patient reports she has not been checking blood sugar as regularly due to being out of test strips. She reports she found some extra test strips and has been checking periodically. Patient reports been following sliding scale adjustment for insulin . She denies symptoms related to hyperglycemia.  Gastrointestinal Gastrointestinal Symptoms Reported: No symptoms reported      Genitourinary Genitourinary Symptoms Reported: No symptoms reported Additional Genitourinary Details: Patient reports urinary frequency has decreased with blood sugar control    Integumentary Integumentary Symptoms Reported: Wound Additional Integumentary Details: Patient reports wound is almost closed up. Reminded of upcoming appointment with [facility] for wound check Skin Management Strategies: Dressing changes, Routine screening Skin Comment: Patient reports HH RN continues to make visits and is hoping to be able to discharge soon  Musculoskeletal Musculoskelatal Symptoms Reviewed: Difficulty walking, Other Other Musculoskeletal Symptoms: Patient denies pain at time of assessment Additional Musculoskeletal Details: Patient report she called [employer] the other day regarding order for wheelchair and [facility] but they had to update her records so she was not able to get an answer. Patient reports she plans to call today after we get off the phone Musculoskeletal Management Strategies: Adequate rest, Exercise, Medical device (Rollator) Musculoskeletal Comment: Patient denies falls since previous CMRN visit Falls in the past year?: No Number of falls in past year: 1 or less Was there an injury with Fall?: No Fall Risk Category  Calculator: 0 Patient Fall Risk Level: Low Fall Risk Patient at Risk  for Falls Due to: Impaired balance/gait Fall risk Follow up: Falls evaluation completed, Education provided, Falls prevention discussed  Psychosocial Psychosocial Symptoms Reported: No symptoms reported          04/05/2024    PHQ2-9 Depression Screening   Little interest or pleasure in doing things    Feeling down, depressed, or hopeless    PHQ-2 - Total Score    Trouble falling or staying asleep, or sleeping too much    Feeling tired or having little energy    Poor appetite or overeating     Feeling bad about yourself - or that you are a failure or have let yourself or your family down    Trouble concentrating on things, such as reading the newspaper or watching television    Moving or speaking so slowly that other people could have noticed.  Or the opposite - being so fidgety or restless that you have been moving around a lot more than usual    Thoughts that you would be better off dead, or hurting yourself in some way    PHQ2-9 Total Score    If you checked off any problems, how difficult have these problems made it for you to do your work, take care of things at home, or get along with other people    Depression Interventions/Treatment      There were no vitals filed for this visit. Pain Scale: 0-10 Pain Score: 0-No pain  Medications Reviewed Today     Reviewed by Arno Rosaline SQUIBB, RN (Registered Nurse) on 04/05/24 at 1115  Med List Status: <None>   Medication Order Taking? Sig Documenting Provider Last Dose Status Informant  acetaminophen  (TYLENOL ) 500 MG tablet [id number]  Take 2 tablets (1,000 mg total) by mouth every 6 (six) hours as needed for mild pain (pain score 1-3), fever or headache.  Patient not taking: Reported on 02/29/2024   Vicci Burnard SAUNDERS, PA-C  Active   albuterol  (VENTOLIN  HFA) 108 (90 Base) MCG/ACT inhaler [id number]  Inhale 2 puffs into the lungs every 4 (four) hours as needed for  wheezing. Oley Bascom RAMAN, NP  Active Self, Pharmacy Records  Blood Glucose Monitoring Suppl (BLOOD GLUCOSE MONITOR SYSTEM) w/Device KIT [id number]  Use to check blood sugar up to four times daily Barbarann Nest, MD  Active   Blood Glucose Monitoring Suppl DEVI [id number]  1 each by Does not apply route as directed. Dispense based on patient and insurance preference. Use up to four times daily as directed. (FOR ICD-10 E10.9, E11.9). Oley Bascom RAMAN, NP  Active   cetirizine  (ZYRTEC ) 10 MG tablet [id number]  Take 1 tablet (10 mg total) by mouth daily. Oley Bascom RAMAN, NP  Active Self, Pharmacy Records  Ferrous Sulfate  90 (18 Fe) MG TABS [id number]  Take 1 tablet by mouth daily. Oley Bascom RAMAN, NP  Active Self, Pharmacy Records  gabapentin  (NEURONTIN ) 300 MG capsule [id number]  TAKE 1 CAPSULE THREE TIMES DAILY Nichols, Tonya S, NP  Active Self, Pharmacy Records  Glucose Blood (BLOOD GLUCOSE TEST STRIPS) STRP [id number]  1 each by Does not apply route as directed. Dispense based on patient and insurance preference. Use up to four times daily as directed. (FOR ICD-10 E10.9, E11.9). Oley Bascom RAMAN, NP  Active   Glucose Blood (BLOOD GLUCOSE TEST STRIPS) STRP [id number]  Use up to four times daily as directed. (FOR ICD-10 E10.9, E11.9). Oley Bascom RAMAN, NP  Active   IBU 600 MG tablet [id number]  TAKE 1 TABLET EVERY 8  HOURS AS NEEDED Oley Bascom RAMAN, NP  Active   insulin  aspart (NOVOLOG ) 100 UNIT/ML FlexPen [id number] Yes Inject 4 units into the skin three times daily before meals plus additional correction insulin  per sliding scale: BG <150= 0 unit; BG 150-200= 2 unit; BG 201-250= 4 unit; BG 251-300= 6 unit; BG 301-350= 8 unit; BG 351-400= 10 unit; BG >400= 12 unit and Call Primary Care. Oley Bascom RAMAN, NP  Active   Insulin  Glargine Solostar (LANTUS ) 100 UNIT/ML Solostar Pen [id number] Yes Inject 20 Units into the skin 2 (two) times daily. Oley Bascom RAMAN, NP  Active   Insulin  Pen Needle (PEN NEEDLES) 32G X 4  MM MISC [id number]  Use as directed to inject insulin  twice daily. Oley Bascom RAMAN, NP  Active   Lancet Device MISC [id number]  1 each by Does not apply route as directed. Dispense based on patient and insurance preference. Use up to four times daily as directed. (FOR ICD-10 E10.9, E11.9). Oley Bascom RAMAN, NP  Active   Lancets MISC [id number]  1 each by Does not apply route as directed. Dispense based on patient and insurance preference. Use up to four times daily as directed. (FOR ICD-10 E10.9, E11.9). Oley Bascom RAMAN, NP  Active   lisinopril  (ZESTRIL ) 20 MG tablet [id number]  Take 1 tablet (20 mg total) by mouth daily. Oley Bascom RAMAN, NP  Active Self, Pharmacy Records  methocarbamol  (ROBAXIN ) 500 MG tablet [id number]  Take 1 tablet (500 mg total) by mouth every 6 (six) hours as needed for muscle spasms. Vicci Burnard SAUNDERS, PA-C  Active   naproxen  (NAPROSYN ) 500 MG tablet [id number]  TAKE 1 TABLET TWICE DAILY WITH MEALS Nichols, Tonya S, NP  Active Self, Pharmacy Records  oxyCODONE  (OXY IR/ROXICODONE ) 5 MG immediate release tablet [id number]  Take 1-3 tablets (5-15 mg total) by mouth every 4 (four) hours as needed for moderate pain (pain score 4-6) or severe pain (pain score 7-10) (5mg  for moderate pain, 10mg  for severe pain; 15 mg for dressing changes).  Patient not taking: Reported on 02/29/2024   Vicci Burnard SAUNDERS, PA-C  Active   rosuvastatin  (CRESTOR ) 40 MG tablet [id number]  Take 1 tablet (40 mg total) by mouth daily. Oley Bascom RAMAN, NP  Active Self, Pharmacy Records            Recommendation:   Continue Current Plan of Care  Follow Up Plan:   Telephone follow up appointment date/time:  05/02/24 at 11 AM  Rosaline Finlay, RN MSN [place]  [facility] RN Care Manager Direct Dial: [phone]  Fax: [phone]

## 2024-04-05 NOTE — Patient Instructions (Signed)
 Visit Information  Thank you for taking time to visit with me today. Please don't hesitate to contact me if I can be of assistance to you before our next scheduled appointment.  Your next care management appointment is by telephone on 05/02/24 at 11 AM  Please call the care guide team at [phone] if you need to cancel, schedule, or reschedule an appointment.   Please call the Suicide and Crisis Lifeline: 988 call 1-800-273-TALK (toll free, 24 hour hotline) if you are experiencing a Mental Health or Behavioral Health Crisis or need someone to talk to.  Evelyn Finlay, RN MSN [place]  [facility] Population Health RN Care Manager Direct Dial: [phone]  Fax: [phone]  Following is a copy of your care plan:   Goals Addressed             This Visit's Progress    VBCI RN Care Plan related to cellulitis   On track    Problems:  Chronic Disease Management support and education needs related to recent hospitalization for cellulitis  Goal: Over the next 30 days the Patient will not experience hospital admission as evidenced by review of electronic medical record. Hospital Admissions in last 6 months = 1 verbalize understanding of plan for management of cellulitis/wound as evidenced by patient verbalization of risks for cellulitis, wound care management, and taking medications for pain as precribed work with [facility] RN to manage wound care as evidenced by review of electronic medical record and patient or care team member report    Interventions:   Evaluation of current treatment plan related to wound care self-management and patient's adherence to plan as established by provider. Discussed plans with patient for ongoing care management follow up and provided patient with direct contact information for care management team Reviewed scheduled/upcoming provider appointments including [facility] for wound check 04/16/24 Discussed DME request for wheelchair and [facility].  Patient reports she did attempt to call [facility] but had to update her profile before getting more information. She plans to call again today  Patient Self-Care Activities:  Attend all scheduled provider appointments Call pharmacy for medication refills 3-7 days in advance of running out of medications Call provider office for new concerns or questions  Take medications as prescribed    Plan:  Telephone follow up appointment with care management team member scheduled for:  05/02/24 at 11 AM          VBCI RN Care Plan related to DM   On track    Problems:  Chronic Disease Management support and education needs related to DMII  Goal: Over the next 30 days the Patient will demonstrate Ongoing adherence to prescribed treatment plan for DMII as evidenced by patient report of purchasing test strips and checking blood sugar at least once a day, with fasting blood sugar 80-130 work with pharmacist to obtain CGM and appropriate medication regimen as evidenced by review of electronic medical record and patient or care team member report   Obtain glucose test strips as evidenced by patient report  Interventions:   Diabetes Interventions: Assessed patient's understanding of A1c goal: <7% Provided education to patient about basic DM disease process Reviewed medications with patient and discussed importance of medication adherence Counseled on importance of regular laboratory monitoring as prescribed Discussed plans with patient for ongoing care management follow up and provided patient with direct contact information for care management team Advised patient, providing education and rationale, to check cbg before meals and at bedtime and record, calling primary  provider for findings outside established parameters Educated patient on goal fasting blood sugar of 80-130 Reviewed sliding scale instructions and ensured understanding Ensured patient has shipment of glucose test strips arriving from her  pharmacy Reviewed symptoms related to hyperglycemia Lab Results  Component Value Date   HGBA1C 12.5 (H) 02/11/2024     Patient Self-Care Activities:  Attend all scheduled provider appointments Call provider office for new concerns or questions  Take medications as prescribed   schedule appointment with eye doctor check blood sugar at prescribed times: before meals and at bedtime check feet daily for cuts, sores or redness take the blood sugar log to all doctor visits drink 6 to 8 glasses of water  each day eat fish at least once per week fill half of plate with vegetables limit fast food meals to no more than 1 per week manage portion size  Plan:  Telephone follow up appointment with care management team member scheduled for:  05/02/24 at 11 AM          VBCI RN Care Plan related to HTN   No change    Problems:  Chronic Disease Management support and education needs related to HTN  Goal: Over the next 30 days the Patient will Obtain BP cuff through insurance as evidenced by patient report  Interventions:   Hypertension Interventions: Last practice recorded BP readings:  BP Readings from Last 3 Encounters:  02/29/24 (!) 155/83  02/19/24 (!) 148/65  11/09/23 121/74   Most recent eGFR/CrCl:  Lab Results  Component Value Date   EGFR 117 02/29/2024    No components found for: CRCL  Evaluation of current treatment plan related to hypertension self management and patient's adherence to plan as established by provider Reviewed medications with patient and discussed importance of compliance Counseled on the importance of exercise goals with target of 150 minutes per week Provided education on prescribed diet low sodium Discussed complications of poorly controlled blood pressure such as heart disease, stroke, circulatory complications, vision complications, kidney impairment, sexual dysfunction Advised patient to continue to speak with [facility] regarding OTC benefit for BP  monitor Educated patient on goal BP <130/80  Patient Self-Care Activities:  Attend all scheduled provider appointments Call provider office for new concerns or questions  Take medications as prescribed   call doctor for signs and symptoms of high blood pressure Work with insurance to obtain BP cuff  Plan:  Telephone follow up appointment with care management team member scheduled for:  05/02/24 at 11 AM             Patient verbalized understanding of Care plan and visit instructions communicated this visit

## 2024-04-09 ENCOUNTER — Telehealth: Payer: Self-pay | Admitting: *Deleted

## 2024-04-09 NOTE — Telephone Encounter (Signed)
 Copied from CRM #[id number]. Topic: Clinical - Home Health Verbal Orders >> Apr 09, 2024  9:15 AM Mercedes MATSU wrote: [facility]  [place].   [phone]  opt 1, call before 11:30am  Patient is having issues with her wound care. Requesting peer to peer for patients skilled nursing.  Good morning, This message was routed to our office in error.  This is not a patient of ours.  Thank you.

## 2024-04-23 ENCOUNTER — Other Ambulatory Visit: Payer: Self-pay | Admitting: Nurse Practitioner

## 2024-04-23 DIAGNOSIS — M79671 Pain in right foot: Secondary | ICD-10-CM

## 2024-04-23 DIAGNOSIS — K047 Periapical abscess without sinus: Secondary | ICD-10-CM

## 2024-04-23 NOTE — Telephone Encounter (Signed)
 Please advise [facility]

## 2024-04-23 NOTE — Telephone Encounter (Unsigned)
 Copied from CRM [id number]. Topic: Clinical - Medication Refill >> Apr 23, 2024  8:59 AM Evelyn Watts wrote: Medication: gabapentin  (NEURONTIN ) 300 MG capsule [[id number]], naproxen  (NAPROSYN ) 500 MG tablet [[id number]] & IBU 600 MG tablet [[id number]]  Has the patient contacted their pharmacy? Yes, they asked patient to contact the provider. (Agent: If no, request that the patient contact the pharmacy for the refill. If patient does not wish to contact the pharmacy document the reason why and proceed with request.) (Agent: If yes, when and what did the pharmacy advise?)  This is the patient's preferred pharmacy:  [facility] - [place], [place] - [address] [phone] [address] [place] [address]    Is this the correct pharmacy for this prescription? Yes If no, delete pharmacy and type the correct one.   Has the prescription been filled recently? No  Is the patient out of the medication? Yes  Has the patient been seen for an appointment in the last year OR does the patient have an upcoming appointment? Yes  Can we respond through MyChart? No  Agent: Please be advised that Rx refills may take up to 3 business days. We ask that you follow-up with your pharmacy.

## 2024-04-24 ENCOUNTER — Other Ambulatory Visit (HOSPITAL_COMMUNITY): Payer: Self-pay

## 2024-04-24 ENCOUNTER — Other Ambulatory Visit: Payer: Self-pay

## 2024-04-24 MED ORDER — IBUPROFEN 600 MG PO TABS
600.0000 mg | ORAL_TABLET | Freq: Three times a day (TID) | ORAL | 0 refills | Status: AC | PRN
  Filled 2024-04-24: qty 30, 10d supply, fill #0

## 2024-04-24 MED ORDER — NAPROXEN 500 MG PO TABS
500.0000 mg | ORAL_TABLET | Freq: Two times a day (BID) | ORAL | 3 refills | Status: AC
  Filled 2024-04-24: qty 180, 90d supply, fill #0

## 2024-04-24 MED ORDER — GABAPENTIN 300 MG PO CAPS
300.0000 mg | ORAL_CAPSULE | Freq: Three times a day (TID) | ORAL | 3 refills | Status: DC
  Filled 2024-04-24: qty 270, 90d supply, fill #0

## 2024-05-02 ENCOUNTER — Telehealth: Payer: Self-pay

## 2024-05-03 ENCOUNTER — Other Ambulatory Visit: Payer: Self-pay | Admitting: Nurse Practitioner

## 2024-05-03 ENCOUNTER — Other Ambulatory Visit (HOSPITAL_COMMUNITY): Payer: Self-pay

## 2024-05-03 ENCOUNTER — Other Ambulatory Visit: Payer: Self-pay

## 2024-05-03 DIAGNOSIS — M79671 Pain in right foot: Secondary | ICD-10-CM

## 2024-05-03 DIAGNOSIS — K047 Periapical abscess without sinus: Secondary | ICD-10-CM

## 2024-05-03 NOTE — Telephone Encounter (Signed)
 Called [employer]. I was able to cancel the Gabapentin  prescription but not the ibuprofen  as it has already been shipped to patient. Please advise on sending the gabapentin  to preferred pharmacy.   Pt has been advised and states that she already has the ibuprofen  and that she is only needing the Gabapentin  sent.

## 2024-05-03 NOTE — Telephone Encounter (Signed)
 Copied from CRM [id number]. Topic: Clinical - Medication Refill >> May 03, 2024  9:16 AM Tiffini S wrote: Medication:gabapentin  (NEURONTIN ) 300 MG capsule   Has the patient contacted their pharmacy? Yes (Agent: If no, request that the patient contact the pharmacy for the refill. If patient does not wish to contact the pharmacy document the reason why and proceed with request.) (Agent: If yes, when and what did the pharmacy advise?)  This is the patient's preferred pharmacy:    [facility] - [place], [place] - [address] [facility] [address] [place] [address] Phone: [phone] Fax: [phone]    Is this the correct pharmacy for this prescription? Yes If no, delete pharmacy and type the correct one.   Has the prescription been filled recently? Yes  Is the patient out of the medication? Yes  Has the patient been seen for an appointment in the last year OR does the patient have an upcoming appointment? Yes  Can we respond through MyChart? Yes  Agent: Please be advised that Rx refills may take up to 3 business days. We ask that you follow-up with your pharmacy.

## 2024-05-03 NOTE — Telephone Encounter (Signed)
 Was filled a week ago and declined . Call pt she advise she want to change pharmacy due to not getting [facility] from [facility]. Kh

## 2024-05-03 NOTE — Patient Outreach (Signed)
 Complex Care Management   Visit Note  05/03/2024  Name:  Evelyn Watts MRN: [id number] DOB: 1975/07/15  Situation: Referral received for Complex Care Management related to Diabetes with Complications and HTN I obtained verbal consent from Patient.  Visit completed with Patient  on the phone  Background:   Past Medical History:  Diagnosis Date   Allergies    Asthma    Diabetes mellitus without complication (HCC)    Essential hypertension     Assessment: Patient Reported Symptoms:  Cognitive Cognitive Status: Able to follow simple commands, Alert and oriented to person, place, and time, Normal speech and language skills Cognitive/Intellectual Conditions Management [RPT]: None reported or documented in medical history or problem list   Health Maintenance Behaviors: Annual physical exam, Stress management, Healthy diet Health Facilitated by: Healthy diet, Pain control, Rest, Stress management  Neurological Neurological Review of Symptoms: No symptoms reported    HEENT HEENT Symptoms Reported: No symptoms reported HEENT Comment: Patient denies new or worsening issues with vision or dental needs. She reports she forgot to discuss dentist and eye doctor list with [facility] when she spoke with them    Cardiovascular Cardiovascular Symptoms Reported: No symptoms reported Does patient have uncontrolled Hypertension?: No Is patient checking Blood Pressure at home?: No Cardiovascular Management Strategies: Medication therapy, Routine screening Cardiovascular Comment: Patient reports she forgot to ask [employer] about BP monitor. She reports she will call today  Respiratory Respiratory Symptoms Reported: No symptoms reported    Endocrine Endocrine Symptoms Reported: No symptoms reported Is patient diabetic?: Yes Is patient checking blood sugars at home?: Yes List most recent blood sugar readings, include date and time of day: Most recent 130 a couple of days ago Endocrine Comment: Patient  reports she is waiting to get more test strips  Gastrointestinal Gastrointestinal Symptoms Reported: Not assessed      Genitourinary Genitourinary Symptoms Reported: Not assessed    Integumentary Integumentary Symptoms Reported: Wound Additional Integumentary Details: Patient reports she had visit with [facility] yesterday, and was told wound is almost healed up. She was told they estimate wound will be healed in about 1.5 weeks per patient. Skin Management Strategies: Dressing changes, Routine screening Skin Comment: Patient reports HH RN has signed off.  Musculoskeletal Musculoskelatal Symptoms Reviewed: Difficulty walking Additional Musculoskeletal Details: Patient reports she called [employer] but forgot to ask about wheelchair and BSC. She reports she will call them later today Musculoskeletal Management Strategies: Adequate rest, Exercise, Medical device (Rollator) Musculoskeletal Comment: Patient denies falls since previous CMRN visit Falls in the past year?: No Number of falls in past year: 1 or less Was there an injury with Fall?: No Fall Risk Category Calculator: 0 Patient Fall Risk Level: Low Fall Risk Patient at Risk for Falls Due to: Impaired balance/gait Fall risk Follow up: Falls evaluation completed, Education provided, Falls prevention discussed  Psychosocial Psychosocial Symptoms Reported: Not assessed          05/03/2024    PHQ2-9 Depression Screening   Little interest or pleasure in doing things    Feeling down, depressed, or hopeless    PHQ-2 - Total Score    Trouble falling or staying asleep, or sleeping too much    Feeling tired or having little energy    Poor appetite or overeating     Feeling bad about yourself - or that you are a failure or have let yourself or your family down    Trouble concentrating on things, such as reading the newspaper or watching  television    Moving or speaking so slowly that other people could have noticed.  Or the  opposite - being so fidgety or restless that you have been moving around a lot more than usual    Thoughts that you would be better off dead, or hurting yourself in some way    PHQ2-9 Total Score    If you checked off any problems, how difficult have these problems made it for you to do your work, take care of things at home, or get along with other people    Depression Interventions/Treatment      There were no vitals filed for this visit. Pain Scale: 0-10 Pain Score: 0-No pain  Medications Reviewed Today     Reviewed by Arno Rosaline SQUIBB, RN (Registered Nurse) on 05/03/24 at 1044  Med List Status: <None>   Medication Order Taking? Sig Documenting Provider Last Dose Status Informant  acetaminophen  (TYLENOL ) 500 MG tablet [id number]  Take 2 tablets (1,000 mg total) by mouth every 6 (six) hours as needed for mild pain (pain score 1-3), fever or headache.  Patient not taking: Reported on 02/29/2024   Vicci Burnard SAUNDERS, PA-C  Active   albuterol  (VENTOLIN  HFA) 108 (90 Base) MCG/ACT inhaler [id number]  Inhale 2 puffs into the lungs every 4 (four) hours as needed for wheezing. Oley Bascom RAMAN, NP  Active Self, Pharmacy Records  Blood Glucose Monitoring Suppl (BLOOD GLUCOSE MONITOR SYSTEM) w/Device KIT [id number]  Use to check blood sugar up to four times daily Barbarann Nest, MD  Active   Blood Glucose Monitoring Suppl DEVI [id number]  1 each by Does not apply route as directed. Dispense based on patient and insurance preference. Use up to four times daily as directed. (FOR ICD-10 E10.9, E11.9). Oley Bascom RAMAN, NP  Active   cetirizine  (ZYRTEC ) 10 MG tablet [id number]  Take 1 tablet (10 mg total) by mouth daily. Oley Bascom RAMAN, NP  Active Self, Pharmacy Records  Ferrous Sulfate  90 (18 Fe) MG TABS [id number]  Take 1 tablet by mouth daily. Oley Bascom RAMAN, NP  Active Self, Pharmacy Records  gabapentin  (NEURONTIN ) 300 MG capsule [id number]  Take 1 capsule (300 mg total) by mouth 3 (three) times daily.  Oley Bascom RAMAN, NP  Active   Glucose Blood (BLOOD GLUCOSE TEST STRIPS) STRP [id number]  1 each by Does not apply route as directed. Dispense based on patient and insurance preference. Use up to four times daily as directed. (FOR ICD-10 E10.9, E11.9). Oley Bascom RAMAN, NP  Active   Glucose Blood (BLOOD GLUCOSE TEST STRIPS) STRP [id number]  Use up to four times daily as directed. (FOR ICD-10 E10.9, E11.9). Oley Bascom RAMAN, NP  Active   ibuprofen  (IBU) 600 MG tablet [id number]  Take 1 tablet (600 mg total) by mouth every 8 (eight) hours as needed. Oley Bascom RAMAN, NP  Active   insulin  aspart (NOVOLOG ) 100 UNIT/ML FlexPen [id number]  Inject 4 units into the skin three times daily before meals plus additional correction insulin  per sliding scale: BG <150= 0 unit; BG 150-200= 2 unit; BG 201-250= 4 unit; BG 251-300= 6 unit; BG 301-350= 8 unit; BG 351-400= 10 unit; BG >400= 12 unit and Call Primary Care. Oley Bascom RAMAN, NP  Active   Insulin  Glargine Solostar (LANTUS ) 100 UNIT/ML Solostar Pen [id number]  Inject 20 Units into the skin 2 (two) times daily. Oley Bascom RAMAN, NP  Active   Insulin  Pen Needle (PEN NEEDLES) 32G X 4 MM MISC [id number]  Use as  directed to inject insulin  twice daily. Oley Bascom RAMAN, NP  Active   Lancet Device MISC [id number]  1 each by Does not apply route as directed. Dispense based on patient and insurance preference. Use up to four times daily as directed. (FOR ICD-10 E10.9, E11.9). Oley Bascom RAMAN, NP  Active   Lancets MISC [id number]  1 each by Does not apply route as directed. Dispense based on patient and insurance preference. Use up to four times daily as directed. (FOR ICD-10 E10.9, E11.9). Oley Bascom RAMAN, NP  Active   lisinopril  (ZESTRIL ) 20 MG tablet [id number]  Take 1 tablet (20 mg total) by mouth daily. Oley Bascom RAMAN, NP  Active Self, Pharmacy Records  methocarbamol  (ROBAXIN ) 500 MG tablet [id number]  Take 1 tablet (500 mg total) by mouth every 6 (six) hours as needed for  muscle spasms. Vicci Burnard SAUNDERS, PA-C  Active   naproxen  (NAPROSYN ) 500 MG tablet [id number]  Take 1 tablet (500 mg total) by mouth 2 (two) times daily with a meal. Oley Bascom RAMAN, NP  Active   oxyCODONE  (OXY IR/ROXICODONE ) 5 MG immediate release tablet [id number]  Take 1-3 tablets (5-15 mg total) by mouth every 4 (four) hours as needed for moderate pain (pain score 4-6) or severe pain (pain score 7-10) (5mg  for moderate pain, 10mg  for severe pain; 15 mg for dressing changes).  Patient not taking: Reported on 02/29/2024   Vicci Burnard SAUNDERS, PA-C  Active   rosuvastatin  (CRESTOR ) 40 MG tablet [id number]  Take 1 tablet (40 mg total) by mouth daily. Oley Bascom RAMAN, NP  Active Self, Pharmacy Records            Recommendation:   Referral to: BSW Continue Current Plan of Care  Follow Up Plan:   Telephone follow up appointment date/time:  05/31/24 at 10 AM Referral to BSW  Rosaline Finlay, RN MSN [place]  [facility] RN Care Manager Direct Dial: [phone]  Fax: [phone]

## 2024-05-03 NOTE — Telephone Encounter (Signed)
 gabapentin  (NEURONTIN ) 300 MG capsule [Pharmacy Med Name: GABAPENTIN  300 MG Oral Capsule]      IBU 600 MG tablet [Pharmacy Med Name: IBU 600 MG Oral Tablet]

## 2024-05-03 NOTE — Patient Instructions (Signed)
 Visit Information  Thank you for taking time to visit with me today. Please don't hesitate to contact me if I can be of assistance to you before our next scheduled appointment.  Your next care management appointment is by telephone on 05/31/24 at 10 AM  Please call the care guide team at [phone] if you need to cancel, schedule, or reschedule an appointment.   Please call the Suicide and Crisis Lifeline: 988 call 1-800-273-TALK (toll free, 24 hour hotline) if you are experiencing a Mental Health or Behavioral Health Crisis or need someone to talk to.  Rosaline Finlay, RN MSN [place]  VBCI Population Health RN Care Manager Direct Dial: [phone]  Fax: [phone]

## 2024-05-06 ENCOUNTER — Other Ambulatory Visit (HOSPITAL_COMMUNITY): Payer: Self-pay

## 2024-05-06 ENCOUNTER — Other Ambulatory Visit: Payer: Self-pay

## 2024-05-06 ENCOUNTER — Other Ambulatory Visit: Payer: Self-pay | Admitting: Nurse Practitioner

## 2024-05-06 DIAGNOSIS — M79671 Pain in right foot: Secondary | ICD-10-CM

## 2024-05-06 MED ORDER — GABAPENTIN 300 MG PO CAPS
300.0000 mg | ORAL_CAPSULE | Freq: Three times a day (TID) | ORAL | 3 refills | Status: DC
  Filled 2024-05-06: qty 270, 90d supply, fill #0

## 2024-05-06 NOTE — Telephone Encounter (Signed)
 Please advise. You have sent the future refills to incorrect pharmacy. Pt is requesting that it be sent to [facility].    Copied from CRM [id number]. Topic: Clinical - Prescription Issue >> May 06, 2024 11:50 AM Wess RAMAN wrote: Reason for CRM: Patient would like a 90 day supply of gabapentin  (NEURONTIN ) 300 MG capsule sent to pharmacy. Patient has been out of medication for over 1 month now  Callback #: [id number]  Pharmacy:  [facility] - [place], [place] - [address] [facility] [address] [place] [address] Phone: [phone] Fax: [phone] Hours: Not open 24 hours

## 2024-05-07 ENCOUNTER — Other Ambulatory Visit (HOSPITAL_COMMUNITY): Payer: Self-pay

## 2024-05-07 ENCOUNTER — Ambulatory Visit (INDEPENDENT_AMBULATORY_CARE_PROVIDER_SITE_OTHER): Payer: Self-pay

## 2024-05-07 DIAGNOSIS — E1165 Type 2 diabetes mellitus with hyperglycemia: Secondary | ICD-10-CM

## 2024-05-07 DIAGNOSIS — Z794 Long term (current) use of insulin: Secondary | ICD-10-CM

## 2024-05-07 MED ORDER — GABAPENTIN 300 MG PO CAPS: 300.0000 mg | ORAL_CAPSULE | Freq: Three times a day (TID) | ORAL | 3 refills | Status: AC

## 2024-05-07 MED ORDER — FREESTYLE LIBRE 3 PLUS SENSOR MISC: 11 refills | Status: AC

## 2024-05-07 MED ORDER — FREESTYLE LIBRE 3 READER DEVI: 0 refills | Status: AC

## 2024-05-07 NOTE — Progress Notes (Signed)
 "  05/07/2024 Name: Evelyn Watts MRN: [id number] DOB: Aug 24, 1975  Chief Complaint  Patient presents with   Diabetes    Druscilla Watts is a 49 y.o. year old female who presented for a telephone visit. Originally scheduled in person but switched to telephone upon request.   They were referred to the pharmacist by their PCP for assistance in managing diabetes, hypertension, and hyperlipidemia.   Subjective: Patient was lost to follow up with pharmacy since July 2025. She was admitted from 02/10/24 to 02/19/24 with necrotizing fascitis. She was discharged with a wound vac. A1C during admission was 12.5%. She was discharged on insulin  glargine (Lantus ) 20 units BID, Novolog  TID with meals per SS, and was instructed to resume Ozempic  and metformin  at home. She saw PCP on 02/29/24. BP was 155/83 mmHg. At pharmacy call on 03/01/24, patient reported that her husband is managing her medications and BG. They had only recently been able to monitor her sugars, but reported one value that was at goal. They were not interested in having her restart Ozempic  or metformin , because they believed her sugars were best controlled on this basal/bolus insulin  regimen in the hospital and they would like to continue that without changes. At pharmacy call on 03/29/24, instructed to take Novolog  set dose of 4 units with meals + sliding scale for correction. Advised how to obtain new test strips.   Today, patient reports doing ok. Spoke with her and her husband. They are interested in retrialing CGM in the future. Husband is managing her insulin  and using her glucometer to check BG TID.   Care Team: Primary Care Provider: Oley Bascom RAMAN, NP ; Next Scheduled Visit: 05/31/24  Medication Access/Adherence  Current Pharmacy:  DARRYLE LAW - [facility] [address] [place] [address] Phone: [phone] Fax: [phone]  [facility] - [place], [place] - [address] [facility] [address] [place] [address] Phone: [phone] Fax: [phone]  [facility] - [place], [place] - [address] [place] [address] Phone: [phone] Fax: [phone]   Patient reports affordability concerns with their medications: No  - copays usually $0  Patient reports access/transportation concerns to their pharmacy: Yes  - Typically use mail order pharmacies. Now prefer [facility] delivery. - FL3+ sensors have been mailed out multiple times from [facility] pharmacy and patient has not received them. Thinks they may be stolen from her porch. No current supply  Patient reports adherence concerns with their medications:  - hx of running out of medications without appropriate communication   Diabetes:  Current medications:  - Lantus  20 units twice daily (in the AM and PM) - Novolog  4 units into the skin three times daily before meals plus additional correction insulin  per sliding scale: BG <150= 0 unit; BG 150-200= 2 unit; BG 201-250= 4 unit; BG 251-300= 6 unit; BG 301-350= 8 unit; BG 351-400= 10 unit; BG >400= 12 unit. Patient's husband sometimes giving extra 8-10 units in the evening for elevated sugars  Medications tried in the past: dulaglutide (upset stomach, nausea), Victoza  (inadequate response)  Using glucometer; testing in the morning, after meals, and at bedtime. Accu Chek meter.   PM blood sugars (before bed, over the past week) 215 188 135 140 345 > after noodles, potato chips. 163 123   Patient denies hypoglycemic s/sx including dizziness, shakiness, sweating. Patient denies hyperglycemic symptoms including polyuria, polydipsia, polyphagia, nocturia, neuropathy, blurred vision.  Current meal patterns: Reports since she has been home she has been eating baked chicken, green beans, corn. She is drinking water  throughout the day, no other drinks. Recently eliminated noodles from diet.   Current physical  activity: currently limited s/p recovery from leg wound  Hypertension:  Current medications: lisinopril  20 mg daily   Hyperlipidemia/ASCVD Risk Reduction  Current lipid lowering medications: rosuvastatin  20 mg daily   The 10-year ASCVD risk score (Arnett DK, et al., 2019) is: 4.7%   Values used to calculate the score:     Age: 85 years     Clinically relevant sex: Female     Is Non-Hispanic African American: No     Diabetic: Yes     Tobacco smoker: No     Systolic Blood Pressure: 138 mmHg     Is BP treated: Yes     HDL Cholesterol: 30 mg/dL     Total Cholesterol: 163 mg/dL   Objective:  BP Readings from Last 3 Encounters:  02/29/24 (!) 155/83  02/19/24 (!) 148/65  11/09/23 121/74    Lab Results  Component Value Date   HGBA1C 12.5 (H) 02/11/2024   HGBA1C 11.6 (A) 11/09/2023   HGBA1C 12.8 (A) 06/08/2023    Lab Results  Component Value Date   CREATININE 0.51 (L) 02/29/2024   BUN 13 02/29/2024   NA 136 02/29/2024   K 4.6 02/29/2024   CL 100 02/29/2024   CO2 24 02/29/2024    Lab Results  Component Value Date   CHOL 163 09/12/2023   HDL 30 (L) 09/12/2023   LDLCALC 73 09/12/2023   LDLDIRECT 79 09/12/2023   TRIG 380 (H) 09/12/2023   CHOLHDL 5.4 (H) 09/12/2023    Medications Reviewed Today     Reviewed by Brinda Lorain SQUIBB, RPH-CPP (Pharmacist) on 05/07/24 at 1646  Med List Status: <None>   Medication Order Taking? Sig Documenting Provider Last Dose Status Informant  acetaminophen  (TYLENOL ) 500 MG tablet [id number]  Take 2 tablets (1,000 mg total) by mouth every 6 (six) hours as needed for mild pain (pain score 1-3), fever or headache.  Patient not taking: Reported on 02/29/2024   Vicci Burnard SAUNDERS, PA-C  Active   albuterol  (VENTOLIN  HFA) 108 (90 Base) MCG/ACT inhaler [id number]  Inhale 2 puffs into the lungs every 4 (four) hours as needed for wheezing. Oley Bascom RAMAN, NP  Active Self, Pharmacy Records  Blood Glucose Monitoring Suppl (BLOOD GLUCOSE MONITOR SYSTEM)  w/Device KIT [id number]  Use to check blood sugar up to four times daily Barbarann Nest, MD  Active   Blood Glucose Monitoring Suppl DEVI [id number]  1 each by Does not apply route as directed. Dispense based on patient and insurance preference. Use up to four times daily as directed. (FOR ICD-10 E10.9, E11.9). Oley Bascom RAMAN, NP  Active   cetirizine  (ZYRTEC ) 10 MG tablet [id number]  Take 1 tablet (10 mg total) by mouth daily. Oley Bascom RAMAN, NP  Active Self, Pharmacy Records  Ferrous Sulfate  90 (18 Fe) MG TABS [id number]  Take 1 tablet by mouth daily. Oley Bascom RAMAN, NP  Active Self, Pharmacy Records  gabapentin  (NEURONTIN ) 300 MG capsule [id number]  Take 1 capsule (300 mg total) by mouth 3 (three) times daily. Oley Bascom RAMAN, NP  Active   Glucose Blood (BLOOD GLUCOSE TEST STRIPS) STRP [id number]  1 each by Does not apply route as directed. Dispense based on patient and insurance preference. Use up to four times daily as directed. (FOR ICD-10 E10.9, E11.9). Oley,  Bascom RAMAN, NP  Active   Glucose Blood (BLOOD GLUCOSE TEST STRIPS) STRP [id number]  Use up to four times daily as directed. (FOR ICD-10 E10.9, E11.9). Oley Bascom RAMAN, NP  Active   ibuprofen  (IBU) 600 MG tablet [id number]  Take 1 tablet (600 mg total) by mouth every 8 (eight) hours as needed. Oley Bascom RAMAN, NP  Active   insulin  aspart (NOVOLOG ) 100 UNIT/ML FlexPen [id number] Yes Inject 4 units into the skin three times daily before meals plus additional correction insulin  per sliding scale: BG <150= 0 unit; BG 150-200= 2 unit; BG 201-250= 4 unit; BG 251-300= 6 unit; BG 301-350= 8 unit; BG 351-400= 10 unit; BG >400= 12 unit and Call Primary Care. Oley Bascom RAMAN, NP  Active   Insulin  Glargine Solostar (LANTUS ) 100 UNIT/ML Solostar Pen [id number] Yes Inject 20 Units into the skin 2 (two) times daily. Oley Bascom RAMAN, NP  Active   Insulin  Pen Needle (PEN NEEDLES) 32G X 4 MM MISC [id number]  Use as directed to inject insulin  twice daily. Oley Bascom RAMAN, NP  Active   Lancet Device MISC [id number]  1 each by Does not apply route as directed. Dispense based on patient and insurance preference. Use up to four times daily as directed. (FOR ICD-10 E10.9, E11.9). Oley Bascom RAMAN, NP  Active   Lancets MISC [id number]  1 each by Does not apply route as directed. Dispense based on patient and insurance preference. Use up to four times daily as directed. (FOR ICD-10 E10.9, E11.9). Oley Bascom RAMAN, NP  Active   lisinopril  (ZESTRIL ) 20 MG tablet [id number]  Take 1 tablet (20 mg total) by mouth daily. Oley Bascom RAMAN, NP  Active Self, Pharmacy Records  methocarbamol  (ROBAXIN ) 500 MG tablet [id number]  Take 1 tablet (500 mg total) by mouth every 6 (six) hours as needed for muscle spasms. Vicci Burnard SAUNDERS, PA-C  Active   naproxen  (NAPROSYN ) 500 MG tablet [id number]  Take 1 tablet (500 mg total) by mouth 2 (two) times daily with a meal. Oley Bascom RAMAN, NP  Active   oxyCODONE  (OXY IR/ROXICODONE ) 5 MG immediate release tablet [id number]  Take 1-3 tablets (5-15 mg total) by mouth every 4 (four) hours as needed for moderate pain (pain score 4-6) or severe pain (pain score 7-10) (5mg  for moderate pain, 10mg  for severe pain; 15 mg for dressing changes).  Patient not taking: Reported on 02/29/2024   Vicci Burnard SAUNDERS, PA-C  Active   rosuvastatin  (CRESTOR ) 40 MG tablet [id number]  Take 1 tablet (40 mg total) by mouth daily. Oley Bascom RAMAN, NP  Active Self, Pharmacy Records              Assessment/Plan:   Diabetes: - Currently uncontrolled with last A1c 12.5% above goal < 7%. Patient is taking basal/bolus insulin  at this time. Limited SMBG data available, but limited data is improved compared to previous reports. Encouraged continued dosing of Lantus  and Novolog  as prescribed. Will folllow-up on re-ordering CGM when it is billable through insurance ~05/26/24.   and provided alternative option for accessing BG testing supplies ([employer] supplies) until  insurance will cover Accu Chek test strips again. Will titrate prandial insulin , as it sounds like they used up supply early and she is requiring extra doses.  - Reviewed long term cardiovascular and renal outcomes of uncontrolled blood sugar - Reviewed goal A1c, goal fasting, and goal 2 hour post prandial glucose - Reviewed dietary modifications including drinking more water , focusing on intake of protein  and nonstarchy vegetables - Reviewed lifestyle modifications including: increasing physical activity. Commended for daily walking.  - Recommend to continue Lantus  20 units BID - Recommend to inject Novolog  4 units into the skin three times daily before meals plus additional correction insulin  per sliding scale: BG <150= 0 unit; BG 150-200= 2 unit; BG 201-250= 4 unit; BG 251-300= 6 unit; BG 301-350= 8 unit; BG 351-400= 10 unit; BG >400= 12 unit  - Recommend to check glucose TID with glucometer. Will re-attempt to access CGM at follow-up (can be billed through insurance 05/18/24) - A1C due now   Hypertension: - Currently difficult to assess BP control. Elevated at last OV, but patient was in pain from wound. Previously controlled on lisinopril  20 mg daily.   Hyperlipidemia/ASCVD Risk Reduction: - Currently uncontrolled with last LDL 79 mg/dL, above goal <29 mg/dL but improved from 889 mg/dL. TG elevated to 380 mg/dL, above goal < 849 mg/dL, but improved from 543 mg/dL. May benefit from additional lipid lowering therapies in the future. - Continue rosuvastatin  20 mg daily    Follow Up Plan:  Pharmacist in person 06/11/24, PCP 05/30/24   Lorain Baseman, PharmD [facility] [phone]   "

## 2024-05-13 ENCOUNTER — Telehealth: Payer: Self-pay

## 2024-05-15 ENCOUNTER — Telehealth: Payer: Self-pay | Admitting: Nurse Practitioner

## 2024-05-15 NOTE — Telephone Encounter (Signed)
 Spoke with patient in regards to message below. Per patient she has thrown the old ones out and not able to take back to pharmacy to exchange it out. Pt states that she will just await until it is time for the next month supply.

## 2024-05-15 NOTE — Telephone Encounter (Signed)
 Copied from CRM #[id number]. Topic: Clinical - Medical Advice >> May 15, 2024 12:53 PM Victoria B wrote: Reason for CRM: Patient states all 3 of her Ozempic  pens don't work

## 2024-05-20 ENCOUNTER — Other Ambulatory Visit: Payer: Self-pay

## 2024-05-20 ENCOUNTER — Telehealth: Payer: Self-pay | Admitting: Pharmacy Technician

## 2024-05-20 NOTE — Patient Instructions (Signed)
 Visit Information  Thank you for taking time to visit with me today. Please don't hesitate to contact me if I can be of assistance to you before our next scheduled appointment.  Our next appointment is by telephone on 06/03/2024 at 1pm Please call the care guide team at [phone] if you need to cancel or reschedule your appointment.   Following is a copy of your care plan:   Goals Addressed             This Visit's Progress    BSW Goal       Current SDOH Barriers:  Transportation Utility assistance - gas bill  Interventions: Patient interviewed and appropriate screenings performed Referred patient to community resources  Provided patient with information about transportation resources like SCAT Discussed plans with patient for ongoing follow up and provided patient with direct contact number Advised patient to reach out to gas company and request to be put on payment plan or work with her to avoid disconnection.  Patient declined transportation resources and states she already uses medical transportation for doctors appointments.          Please call the Suicide and Crisis Lifeline: 988 go to [facility] [address], [place] [phone]) call 911 if you are experiencing a Mental Health or Behavioral Health Crisis or need someone to talk to.  Patient verbalized understanding of Care plan and visit instructions communicated this visit  Laymon Doll, [place]/[facility] - [facility] Social Worker [phone]

## 2024-05-20 NOTE — Progress Notes (Signed)
" ° °  05/20/2024 Name: Evelyn Watts MRN: [id number] DOB: November 01, 1975  Patient engaged with clinical pharmacist for management of diabetes on 05/07/2024. Outreach by [employer] technician was requested.   Outreached pharmacy to discuss diabetes medication access for FreeStyle reader and Sensors.   Attempted to call pharmacy twice today and a message comes on with their holiday hours. Pharmacy may be closed today due to adverse weather. Per Dr Annemarie, FreeStyle reader was filled 05/07/24 and sensors were filled for 6 or 90 days supply by [facility] on 03/04/2024. Will try pharmacy tomorrow.  Nereyda Bowler, CPhT [facility] Office: [phone] Email: [email or web address]  "

## 2024-05-20 NOTE — Patient Outreach (Signed)
 Social Drivers of Health  Community Resource and Care Coordination Visit Note   05/20/2024  Name: Canna Nickelson MRN: [id number] DOB:[birth year]  Situation: Referral received for [facility] needs assessment and assistance related to Transportation utility assistance. I obtained verbal consent from Patient.  Visit completed with Patient on the phone.   Background:   SDOH Interventions Today    Flowsheet Row Most Recent Value  SDOH Interventions   Food Insecurity Interventions Intervention Not Indicated  [patient gets FNS. no issues.]  Housing Interventions Intervention Not Indicated  Transportation Interventions Patient Declined  [patient states she already gets medical transportation to doctors appointments but declined SCAT resource.]  Utilities Interventions Community Resources Provided     Assessment:   Goals Addressed             This Visit's Progress    BSW Goal       Current SDOH Barriers:  Transportation Utility assistance - gas bill  Interventions: Patient interviewed and appropriate screenings performed Referred patient to community resources  Provided patient with information about transportation resources like SCAT Discussed plans with patient for ongoing follow up and provided patient with direct contact number Advised patient to reach out to gas company and request to be put on payment plan or work with her to avoid disconnection.  Patient declined transportation resources and states she already uses medical transportation for doctors appointments.          Recommendation:   attend all scheduled provider appointments  Follow Up Plan:   Telephone follow up appointment date/time:  06/03/2024 at 1pm.  Laymon Doll, BSW [place]/[facility] - [facility] Social Worker [phone]

## 2024-05-21 ENCOUNTER — Telehealth: Payer: Self-pay | Admitting: Pharmacy Technician

## 2024-05-21 NOTE — Progress Notes (Signed)
" ° °  05/21/2024 Name: Evelyn Watts MRN: [id number] DOB: 15-Jun-1975  Patient is appearing for a follow-up visit with the population health pharmacy technician. Last engaged with the clinical pharmacist to discuss diabetes on 05/07/2024. Contacted pharmacy today to discuss medication access.   Plan from last clinical pharmacist appointment:  Diabetes: - Currently uncontrolled with last A1c 12.5% above goal < 7%. Patient is taking basal/bolus insulin  at this time. Limited SMBG data available, but limited data is improved compared to previous reports. Encouraged continued dosing of Lantus  and Novolog  as prescribed. Will folllow-up on re-ordering CGM when it is billable through insurance ~05/26/24.   and provided alternative option for accessing BG testing supplies ([employer] supplies) until insurance will cover Accu Chek test strips again. Will titrate prandial insulin , as it sounds like they used up supply early and she is requiring extra doses.  - Reviewed long term cardiovascular and renal outcomes of uncontrolled blood sugar - Reviewed goal A1c, goal fasting, and goal 2 hour post prandial glucose - Reviewed dietary modifications including drinking more water , focusing on intake of protein and nonstarchy vegetables - Reviewed lifestyle modifications including: increasing physical activity. Commended for daily walking.  - Recommend to continue Lantus  20 units BID - Recommend to inject Novolog  4 units into the skin three times daily before meals plus additional correction insulin  per sliding scale: BG <150= 0 unit; BG 150-200= 2 unit; BG 201-250= 4 unit; BG 251-300= 6 unit; BG 301-350= 8 unit; BG 351-400= 10 unit; BG >400= 12 unit  - Recommend to check glucose TID with glucometer. Will re-attempt to access CGM at follow-up (can be billed through insurance 05/18/24) - A1C due now Hypertension: - Currently difficult to assess BP control. Elevated at last OV, but patient was in pain from wound.  Previously controlled on lisinopril  20 mg daily. Hyperlipidemia/ASCVD Risk Reduction: - Currently uncontrolled with last LDL 79 mg/dL, above goal <29 mg/dL but improved from 889 mg/dL. TG elevated to 380 mg/dL, above goal < 849 mg/dL, but improved from 543 mg/dL. May benefit from additional lipid lowering therapies in the future. - Continue rosuvastatin  20 mg daily -Follow Up Plan:  Pharmacist in person 06/11/24, PCP 2/5/2(copy/paste from last note)   Medication Adherence Barriers Identified:   Access issues with any new medication or testing device: Yes FreeStyle reader and sensors  Medication Adherence Barriers Addressed/Actions Taken:  Reviewed medication changes per plan from last clinical pharmacist note Medication Access for FreeStyle reader and FreeStyle sensors Will discuss medication access concerns with pharmacist Contacted pharmacy regarding refills Spoke to pharmacist. Pharmcacist at [facility] informs the reader was filled on 05/07/24 and was delivered on 05/09/24. Pharmacist informs he will process sensors today for delivery tomorrow. Pharmacist informs the copay is zero dollars.   Next clinical pharmacist appointment is scheduled for: 06/11/2024  Essie Lagunes, CPhT [facility] Office: [phone] Email: [email or web address]   "

## 2024-05-23 ENCOUNTER — Other Ambulatory Visit (HOSPITAL_COMMUNITY): Payer: Self-pay

## 2024-05-23 ENCOUNTER — Ambulatory Visit: Payer: Self-pay

## 2024-05-23 ENCOUNTER — Other Ambulatory Visit: Payer: Self-pay | Admitting: Nurse Practitioner

## 2024-05-23 ENCOUNTER — Other Ambulatory Visit: Payer: Self-pay

## 2024-05-23 MED ORDER — AMOXICILLIN 875 MG PO TABS
875.0000 mg | ORAL_TABLET | Freq: Two times a day (BID) | ORAL | 0 refills | Status: AC
  Filled 2024-05-23: qty 20, 10d supply, fill #0

## 2024-05-23 NOTE — Telephone Encounter (Signed)
 FYI Only or Action Required?: Action required by provider: clinical question for provider, update on patient condition, and requesting antibx.  Patient was last seen in primary care on 02/29/2024 by Evelyn Bascom RAMAN, NP.  Called Nurse Triage reporting Abscess, Dental Pain, and Oral Swelling.  Symptoms began several days ago.  Interventions attempted: OTC medications: naproxen .  Symptoms are: gradually worsening.  Triage Disposition: See HCP Within 4 Hours (Or PCP Triage)  Patient/caregiver understands and will follow disposition?: No, refuses disposition       Reason for Disposition  [1] SEVERE pain (e.g., excruciating, unable to eat, unable to do any normal activities) AND [2] not improved 2 hours after pain medicine  Answer Assessment - Initial Assessment Questions This RN strongly advised pt be examined in next 4 hours, pt refusing disposition, no dentist established yet, can't wait that long until previously scheduled appt 2/5 so requesting penicillin  be sent to [facility]. Advised pt call back or seek immediate care if any new or worsening symptoms. Sending message to PCP office for call back to pt with further recommendations.     1. LOCATION: Which tooth is hurting?  (e.g., right-side/left-side, upper/lower, front/back)     Big red abscess because tooth is broken, at gum by tooth Swelling is size of a penny  2. ONSET: When did the toothache start?  (e.g., hours, days)      3 days ago  3. SEVERITY: How bad is the toothache?  (Scale 1-10; mild, moderate or severe)     9.5/10, naproxen  not helping Can barely eat Difficulty talking  4. SWELLING: Is there any visible swelling of your face?     Some  Denies: Cold/clammy skin, too weak to stand, heart racing Chest pain Tongue swelling Fever  Protocols used: Toothache-A-AH

## 2024-05-23 NOTE — Telephone Encounter (Signed)
 Please advise [facility]

## 2024-05-23 NOTE — Telephone Encounter (Signed)
 Pt has been contacted and advised on message below. Pt states that she is waiting on list of dentist from insurance company that will accept insurance. Pt has been advised that I can provide her resources through [place] with dentist. She states that she would like that. Mychart message has been sent to patient with listing of dentist around the area. CB.

## 2024-05-24 ENCOUNTER — Telehealth: Payer: Self-pay | Admitting: Pharmacy Technician

## 2024-05-24 NOTE — Progress Notes (Signed)
 "  05/24/2024 Name: Evelyn Watts MRN: [id number] DOB: 1975-10-01  Patient is appearing for a follow-up visit with the population health pharmacy technician. Last engaged with the clinical pharmacist to discuss diabetes on 05/07/2024. Contacted patient today to discuss medication access.   Plan from last clinical pharmacist appointment:  Diabetes: - Currently uncontrolled with last A1c 12.5% above goal < 7%. Patient is taking basal/bolus insulin  at this time. Limited SMBG data available, but limited data is improved compared to previous reports. Encouraged continued dosing of Lantus  and Novolog  as prescribed. Will folllow-up on re-ordering CGM when it is billable through insurance ~05/26/24.    and provided alternative option for accessing BG testing supplies ([employer] supplies) until insurance will cover Accu Chek test strips again. Will titrate prandial insulin , as it sounds like they used up supply early and she is requiring extra doses.  - Reviewed long term cardiovascular and renal outcomes of uncontrolled blood sugar - Reviewed goal A1c, goal fasting, and goal 2 hour post prandial glucose - Reviewed dietary modifications including drinking more water , focusing on intake of protein and nonstarchy vegetables - Reviewed lifestyle modifications including: increasing physical activity. Commended for daily walking.  - Recommend to continue Lantus  20 units BID - Recommend to inject Novolog  4 units into the skin three times daily before meals plus additional correction insulin  per sliding scale: BG <150= 0 unit; BG 150-200= 2 unit; BG 201-250= 4 unit; BG 251-300= 6 unit; BG 301-350= 8 unit; BG 351-400= 10 unit; BG >400= 12 unit  - Recommend to check glucose TID with glucometer. Will re-attempt to access CGM at follow-up (can be billed through insurance 05/18/24) - A1C due now Hypertension: - Currently difficult to assess BP control. Elevated at last OV, but patient was in pain from wound.  Previously controlled on lisinopril  20 mg daily. Hyperlipidemia/ASCVD Risk Reduction: - Currently uncontrolled with last LDL 79 mg/dL, above goal <29 mg/dL but improved from 889 mg/dL. TG elevated to 380 mg/dL, above goal < 849 mg/dL, but improved from 543 mg/dL. May benefit from additional lipid lowering therapies in the future. - Continue rosuvastatin  20 mg daily -Follow Up Plan:  Pharmacist in person 06/11/24, PCP 05/30/24(copy/paste from last note)   Medication Adherence Barriers Identified:   Access issues with any new medication or testing device: Yes FreeStyle sensors   Medication Adherence Barriers Addressed/Actions Taken:  Reviewed medication changes per plan from last clinical pharmacist note Medication Access for FreeStyle Sensors Called [employer] and spoke to [place] who informs the sensors were delivered on 05/22/24. Called patient. HIPAA verified. Patient informs she did not receive sensors. Patient also is inquiring if anyone had called [employer] to pick her up for her appointment on 2/5. Informed patient I would have to call her back about this. Called [employer] again and spoke to [place] who informs the medication was delivered on 1/28 at 1:39pm. She informs the pharmacist will call the patient to discuss. Reached out to clinic PharmD about transportation issues. Per PharmD, patient will need to go thru her insurance and if additional assistance is needed then patient would need to call the clinic. Called patient back. Advised pharmacy will call to discuss the delivery discrepancy. Provided patient information regarding transportation as well as clinic phone number. Incoming call received from [employer]. Rosina informs they spoke with patient and informed patient that her boyfriend signed for the sensors. Rosina informs the patient will search her home for them.  Reminded patient of  date/time of upcoming clinical pharmacist follow up and any  upcoming PCP/specialists visits. Patient denies transportation barriers to the appointment. No Patient will need assistance in scheduling [facility] for her appointments on 2/5 and 2/17.  Next clinical pharmacist appointment is scheduled for: 06/11/24  Evelyn Watts, CPhT [facility] Population Health Pharmacy Office: [phone] Email: [email or web address]   "

## 2024-05-27 ENCOUNTER — Other Ambulatory Visit: Payer: Self-pay

## 2024-05-27 ENCOUNTER — Other Ambulatory Visit (HOSPITAL_COMMUNITY): Payer: Self-pay

## 2024-05-28 ENCOUNTER — Ambulatory Visit: Payer: Self-pay

## 2024-05-30 ENCOUNTER — Ambulatory Visit: Admitting: Nurse Practitioner

## 2024-05-31 ENCOUNTER — Telehealth: Payer: Self-pay | Admitting: Nurse Practitioner

## 2024-05-31 ENCOUNTER — Ambulatory Visit: Payer: Self-pay | Admitting: Nurse Practitioner

## 2024-05-31 ENCOUNTER — Other Ambulatory Visit: Payer: Self-pay

## 2024-05-31 NOTE — Patient Instructions (Signed)
 Visit Information  Thank you for taking time to visit with me today. Please don't hesitate to contact me if I can be of assistance to you before our next scheduled appointment.  Your next care management appointment is by telephone on 06/03/24 at 10 AM  Please check your email for a list of eye doctors. Contact offices to ensure they accept your insurance and accepting new patients  Please call the care guide team at [phone] if you need to cancel, schedule, or reschedule an appointment.   Please call the Suicide and Crisis Lifeline: 988 call 1-800-273-TALK (toll free, 24 hour hotline) if you are experiencing a Mental Health or Behavioral Health Crisis or need someone to talk to.  Rosaline Finlay, RN MSN [place]  VBCI Population Health RN Care Manager Direct Dial: [phone]  Fax: [phone]   Following is a copy of your care plan:   Goals Addressed             This Visit's Progress    VBCI RN Care Plan related to DM   On track    Problems:  Chronic Disease Management support and education needs related to DMII  Goal: Over the next 30 days the Patient will demonstrate Ongoing adherence to prescribed treatment plan for DMII as evidenced by patient report of purchasing CGM sensors and checking blood sugar at least once a day, with fasting blood sugar 80-130 work with pharmacist to obtain CGM and appropriate medication regimen as evidenced by review of electronic medical record and patient or care team member report    Interventions:   Diabetes Interventions: Assessed patient's understanding of A1c goal: <7% Provided education to patient about basic DM disease process Reviewed medications with patient and discussed importance of medication adherence Counseled on importance of regular laboratory monitoring as prescribed Discussed plans with patient for ongoing care management follow up and provided patient with direct contact information for care management  team Advised patient, providing education and rationale, to check cbg before meals and at bedtime and record, calling primary provider for findings outside established parameters Educated patient on goal fasting blood sugar of 80-130 Patient's aid reports he thinks he accidentally threw CGM sensors away. He plans to purchase more out of pocket until patient is eligible for another shipment Patient emailed a list of local eye doctors via insurance website Lab Results  Component Value Date   HGBA1C 12.5 (H) 02/11/2024    Patient Self-Care Activities:  Attend all scheduled provider appointments Call provider office for new concerns or questions  Take medications as prescribed   schedule appointment with eye doctor check blood sugar at prescribed times: before meals and at bedtime check feet daily for cuts, sores or redness take the blood sugar log to all doctor visits drink 6 to 8 glasses of water  each day eat fish at least once per week fill half of plate with vegetables limit fast food meals to no more than 1 per week manage portion size  Plan:  Telephone follow up appointment with care management team member scheduled for:  06/03/24 at 10 AM          VBCI RN Care Plan related to HTN   No change    Problems:  Chronic Disease Management support and education needs related to HTN  Goal: Over the next 30 days the Patient will Obtain BP cuff through insurance as evidenced by patient report  Interventions:   Hypertension Interventions: Last practice recorded BP readings:  BP Readings from Last  3 Encounters:  02/29/24 (!) 155/83  02/19/24 (!) 148/65  11/09/23 121/74   Most recent eGFR/CrCl:  Lab Results  Component Value Date   EGFR 117 02/29/2024    No components found for: CRCL  Evaluation of current treatment plan related to hypertension self management and patient's adherence to plan as established by provider Reviewed medications with patient and discussed  importance of compliance Counseled on the importance of exercise goals with target of 150 minutes per week Provided education on prescribed diet low sodium Discussed complications of poorly controlled blood pressure such as heart disease, stroke, circulatory complications, vision complications, kidney impairment, sexual dysfunction Patient reports she spoke with [facility] regarding BP monitor but has not heard back. Offered to start 3-way call to [facility], but patient states she is starting to feel tired and wants to take a nap. She requests a call back at a later date Educated patient on goal BP <130/80  Patient Self-Care Activities:  Attend all scheduled provider appointments Call provider office for new concerns or questions  Take medications as prescribed   call doctor for signs and symptoms of high blood pressure Work with insurance to obtain BP cuff  Plan:  Telephone follow up appointment with care management team member scheduled for:  06/03/24 at 10 AM to place 3-way call to [facility] to discuss BP monitor benefit, as well as wheelchair and BSC orders            Patient verbalized understanding of Care plan and visit instructions communicated this visit

## 2024-05-31 NOTE — Patient Outreach (Signed)
 Complex Care Management   Visit Note  05/31/2024  Name:  Evelyn Watts MRN: [id number] DOB: [birth year]  Situation: Referral received for Complex Care Management related to Diabetes with Complications and HTN I obtained verbal consent from Patient.  Visit completed with Patient  on the phone  Background:   Past Medical History:  Diagnosis Date   Allergies    Asthma    Diabetes mellitus without complication (HCC)    Essential hypertension     Assessment: Patient Reported Symptoms:  Cognitive Cognitive Status: Able to follow simple commands, Alert and oriented to person, place, and time, Normal speech and language skills Cognitive/Intellectual Conditions Management [RPT]: None reported or documented in medical history or problem list   Health Maintenance Behaviors: Annual physical exam, Healthy diet, Stress management Health Facilitated by: Healthy diet, Pain control, Rest, Stress management  Neurological Neurological Review of Symptoms: Not assessed    HEENT HEENT Symptoms Reported: No symptoms reported HEENT Comment: Patient denies new or worsening issues with vision or dental needs. She reports she did not receive a list of dentists or eye doctors from [facility], who was supposed to mail this to her. Note per chart review, CMA has sent a list of dentists via MyChart. Patient reports she needs to go through this list.    Cardiovascular Cardiovascular Symptoms Reported: No symptoms reported Does patient have uncontrolled Hypertension?: No Is patient checking Blood Pressure at home?: No Cardiovascular Management Strategies: Medication therapy, Routine screening Cardiovascular Comment: Patient reports she did ask [employer] about BP monitor, and was told they would get back to her. Patient reports she has not heard anything back  Respiratory Respiratory Symptoms Reported: Not assesed    Endocrine Endocrine Symptoms Reported: No symptoms reported Is patient diabetic?: Yes Is patient  checking blood sugars at home?: No Endocrine Comment: Patient reports she has received FreeStyle Libre, but her aid may have accidently thrown away the sensors. Patient's aid states he is going to go purchase some sensors. Patient reports she has been unable to check blood sugar due to not having supplies, but states she is not having any symptoms related to hyerpglycemia  Gastrointestinal Gastrointestinal Symptoms Reported: Not assessed      Genitourinary Genitourinary Symptoms Reported: Not assessed    Integumentary Integumentary Symptoms Reported: No symptoms reported Additional Integumentary Details: Patient reports wound is healed up. She has no further visits with [facility]    Musculoskeletal Musculoskelatal Symptoms Reviewed: Difficulty walking Additional Musculoskeletal Details: Patient reports she has been having some pain in the left knee recently, although not having any pain at time of assessment. Patient denies injury to the knee. She plans to discuss pain with PCP at upcoming appointment. Patient reports she did ask [employer] about BSC and wheelchair, but has not heard back from them Musculoskeletal Management Strategies: Adequate rest, Exercise, Medical device (Rollator) Musculoskeletal Comment: Patient denies falls since previous CMRN visit Falls in the past year?: No Number of falls in past year: 1 or less Was there an injury with Fall?: No Fall Risk Category Calculator: 0 Patient Fall Risk Level: Low Fall Risk Patient at Risk for Falls Due to: Impaired balance/gait Fall risk Follow up: Falls evaluation completed, Education provided, Falls prevention discussed  Psychosocial Psychosocial Symptoms Reported: Not assessed          05/31/2024    PHQ2-9 Depression Screening   Little interest or pleasure in doing things    Feeling down, depressed, or hopeless    PHQ-2 - Total Score  Trouble falling or staying asleep, or sleeping too much    Feeling tired or  having little energy    Poor appetite or overeating     Feeling bad about yourself - or that you are a failure or have let yourself or your family down    Trouble concentrating on things, such as reading the newspaper or watching television    Moving or speaking so slowly that other people could have noticed.  Or the opposite - being so fidgety or restless that you have been moving around a lot more than usual    Thoughts that you would be better off dead, or hurting yourself in some way    PHQ2-9 Total Score    If you checked off any problems, how difficult have these problems made it for you to do your work, take care of things at home, or get along with other people    Depression Interventions/Treatment      There were no vitals filed for this visit. Pain Scale: 0-10 Pain Score: 0-No pain  Medications Reviewed Today     Reviewed by Arno Rosaline SQUIBB, RN (Registered Nurse) on 05/31/24 at 1015  Med List Status: <None>   Medication Order Taking? Sig Documenting Provider Last Dose Status Informant  acetaminophen  (TYLENOL ) 500 MG tablet [id number]  Take 2 tablets (1,000 mg total) by mouth every 6 (six) hours as needed for mild pain (pain score 1-3), fever or headache.  Patient not taking: Reported on 02/29/2024   Vicci Burnard SAUNDERS, PA-C  Active   albuterol  (VENTOLIN  HFA) 108 (90 Base) MCG/ACT inhaler [id number]  Inhale 2 puffs into the lungs every 4 (four) hours as needed for wheezing. Oley Bascom RAMAN, NP  Active Self, Pharmacy Records  amoxicillin  (AMOXIL ) 875 MG tablet [id number]  Take 1 tablet (875 mg total) by mouth 2 (two) times daily. Oley Bascom RAMAN, NP  Active   Blood Glucose Monitoring Suppl (BLOOD GLUCOSE MONITOR SYSTEM) w/Device KIT [id number]  Use to check blood sugar up to four times daily Barbarann Nest, MD  Active   Blood Glucose Monitoring Suppl DEVI [id number]  1 each by Does not apply route as directed. Dispense based on patient and insurance preference. Use up to four times  daily as directed. (FOR ICD-10 E10.9, E11.9). Oley Bascom RAMAN, NP  Active   cetirizine  (ZYRTEC ) 10 MG tablet [id number]  Take 1 tablet (10 mg total) by mouth daily. Oley Bascom RAMAN, NP  Active Self, Pharmacy Records  Continuous Glucose Receiver (FREESTYLE LIBRE 3 READER) [place] [id number]  Use as directed to monitor glucose with Libre 3 plus sensor. Oley Bascom RAMAN, NP  Active   Continuous Glucose Sensor (FREESTYLE LIBRE 3 PLUS SENSOR) [place] [id number]  Change sensor every 15 days. Oley Bascom RAMAN, NP  Active   Ferrous Sulfate  90 (18 Fe) MG TABS [id number]  Take 1 tablet by mouth daily. Oley Bascom RAMAN, NP  Active Self, Pharmacy Records  gabapentin  (NEURONTIN ) 300 MG capsule [id number]  Take 1 capsule (300 mg total) by mouth 3 (three) times daily. Oley Bascom RAMAN, NP  Active   Glucose Blood (BLOOD GLUCOSE TEST STRIPS) STRP [id number]  1 each by Does not apply route as directed. Dispense based on patient and insurance preference. Use up to four times daily as directed. (FOR ICD-10 E10.9, E11.9). Oley Bascom RAMAN, NP  Active   Glucose Blood (BLOOD GLUCOSE TEST STRIPS) STRP [id number]  Use up to four times daily as directed. (FOR ICD-10 E10.9, E11.9). Oley Bascom RAMAN,  NP  Active   ibuprofen  (IBU) 600 MG tablet [id number]  Take 1 tablet (600 mg total) by mouth every 8 (eight) hours as needed. Oley Bascom RAMAN, NP  Active   insulin  aspart (NOVOLOG ) 100 UNIT/ML FlexPen [id number]  Inject 4 units into the skin three times daily before meals plus additional correction insulin  per sliding scale: BG <150= 0 unit; BG 150-200= 2 unit; BG 201-250= 4 unit; BG 251-300= 6 unit; BG 301-350= 8 unit; BG 351-400= 10 unit; BG >400= 12 unit and Call Primary Care. Oley Bascom RAMAN, NP  Active   Insulin  Glargine Solostar (LANTUS ) 100 UNIT/ML Solostar Pen [id number]  Inject 20 Units into the skin 2 (two) times daily. Oley Bascom RAMAN, NP  Active   Insulin  Pen Needle (PEN NEEDLES) 32G X 4 MM MISC [id number]  Use as directed to inject  insulin  twice daily. Oley Bascom RAMAN, NP  Active   Lancet Device MISC [id number]  1 each by Does not apply route as directed. Dispense based on patient and insurance preference. Use up to four times daily as directed. (FOR ICD-10 E10.9, E11.9). Oley Bascom RAMAN, NP  Active   Lancets MISC [id number]  1 each by Does not apply route as directed. Dispense based on patient and insurance preference. Use up to four times daily as directed. (FOR ICD-10 E10.9, E11.9). Oley Bascom RAMAN, NP  Active   lisinopril  (ZESTRIL ) 20 MG tablet [id number]  Take 1 tablet (20 mg total) by mouth daily. Oley Bascom RAMAN, NP  Active Self, Pharmacy Records  methocarbamol  (ROBAXIN ) 500 MG tablet [id number]  Take 1 tablet (500 mg total) by mouth every 6 (six) hours as needed for muscle spasms. Vicci Burnard SAUNDERS, PA-C  Active   naproxen  (NAPROSYN ) 500 MG tablet [id number]  Take 1 tablet (500 mg total) by mouth 2 (two) times daily with a meal. Oley Bascom RAMAN, NP  Active   oxyCODONE  (OXY IR/ROXICODONE ) 5 MG immediate release tablet [id number]  Take 1-3 tablets (5-15 mg total) by mouth every 4 (four) hours as needed for moderate pain (pain score 4-6) or severe pain (pain score 7-10) (5mg  for moderate pain, 10mg  for severe pain; 15 mg for dressing changes).  Patient not taking: Reported on 02/29/2024   Vicci Burnard SAUNDERS, PA-C  Active   rosuvastatin  (CRESTOR ) 40 MG tablet [id number]  Take 1 tablet (40 mg total) by mouth daily. Oley Bascom RAMAN, NP  Active Self, Pharmacy Records            Recommendation:   Continue Current Plan of Care  Follow Up Plan:   Telephone follow up appointment date/time:  06/03/24 at 10:30 AM  Rosaline Finlay, RN MSN [place]  [facility] RN Care Manager Direct Dial: [phone]  Fax: [phone]

## 2024-05-31 NOTE — Telephone Encounter (Signed)
 Copied from CRM #[id number]. Topic: General - Transportation >> May 30, 2024  4:28 PM Hadassah PARAS wrote: Reason for CRM: Pt is needing transportation for upcoming appointment. Please advise pt w details on #[phone]  Jun 06 2024 09:40 AM - Office Visit [facility] - A Dept Of [facility] - Bascom GORMAN Borer, NP

## 2024-06-03 ENCOUNTER — Telehealth

## 2024-06-06 ENCOUNTER — Ambulatory Visit: Admitting: Nurse Practitioner

## 2024-06-11 ENCOUNTER — Ambulatory Visit

## 2024-08-27 ENCOUNTER — Ambulatory Visit: Admitting: Endocrinology
